# Patient Record
Sex: Male | Born: 1945 | Race: White | Hispanic: No | Smoking: Former smoker
Health system: Southern US, Community
[De-identification: ages and names within clinical notes are randomized; demographics above are authoritative.]

## PROBLEM LIST (undated history)

## (undated) DIAGNOSIS — Z950 Presence of cardiac pacemaker: Secondary | ICD-10-CM

## (undated) DIAGNOSIS — C3400 Malignant neoplasm of unspecified main bronchus: Secondary | ICD-10-CM

## (undated) DIAGNOSIS — M1990 Unspecified osteoarthritis, unspecified site: Secondary | ICD-10-CM

## (undated) DIAGNOSIS — R599 Enlarged lymph nodes, unspecified: Secondary | ICD-10-CM

## (undated) DIAGNOSIS — C4442 Squamous cell carcinoma of skin of scalp and neck: Secondary | ICD-10-CM

## (undated) DIAGNOSIS — Z9289 Personal history of other medical treatment: Secondary | ICD-10-CM

## (undated) DIAGNOSIS — R591 Generalized enlarged lymph nodes: Secondary | ICD-10-CM

## (undated) DIAGNOSIS — Z9989 Dependence on other enabling machines and devices: Secondary | ICD-10-CM

## (undated) DIAGNOSIS — H353190 Nonexudative age-related macular degeneration, unspecified eye, stage unspecified: Secondary | ICD-10-CM

## (undated) DIAGNOSIS — F419 Anxiety disorder, unspecified: Secondary | ICD-10-CM

## (undated) DIAGNOSIS — I2510 Atherosclerotic heart disease of native coronary artery without angina pectoris: Secondary | ICD-10-CM

## (undated) DIAGNOSIS — I480 Paroxysmal atrial fibrillation: Secondary | ICD-10-CM

## (undated) DIAGNOSIS — Z85820 Personal history of malignant melanoma of skin: Secondary | ICD-10-CM

## (undated) DIAGNOSIS — C3490 Malignant neoplasm of unspecified part of unspecified bronchus or lung: Secondary | ICD-10-CM

## (undated) HISTORY — DX: Malignant neoplasm of unspecified main bronchus: C34.00

## (undated) HISTORY — DX: Personal history of malignant melanoma of skin: Z85.820

## (undated) HISTORY — DX: Squamous cell carcinoma of skin of scalp and neck: C44.42

## (undated) HISTORY — DX: Personal history of other medical treatment: Z92.89

## (undated) HISTORY — DX: Generalized enlarged lymph nodes: R59.1

## (undated) HISTORY — DX: Anxiety disorder, unspecified: F41.9

## (undated) HISTORY — DX: Atherosclerotic heart disease of native coronary artery without angina pectoris: I25.10

## (undated) HISTORY — DX: Enlarged lymph nodes, unspecified: R59.9

## (undated) HISTORY — DX: Nonexudative age-related macular degeneration, unspecified eye, stage unspecified: H35.3190

## (undated) HISTORY — DX: Unspecified osteoarthritis, unspecified site: M19.90

---

## 1999-11-18 ENCOUNTER — Ambulatory Visit (HOSPITAL_COMMUNITY): Admission: RE | Admit: 1999-11-18 | Discharge: 1999-11-19 | Payer: Self-pay | Admitting: Cardiovascular Disease

## 1999-12-29 ENCOUNTER — Encounter: Payer: Self-pay | Admitting: Surgery

## 2000-01-03 ENCOUNTER — Inpatient Hospital Stay (HOSPITAL_COMMUNITY): Admission: RE | Admit: 2000-01-03 | Discharge: 2000-01-09 | Payer: Self-pay | Admitting: Surgery

## 2000-01-03 ENCOUNTER — Encounter (INDEPENDENT_AMBULATORY_CARE_PROVIDER_SITE_OTHER): Payer: Self-pay | Admitting: Specialist

## 2000-02-18 HISTORY — PX: OTHER SURGICAL HISTORY: SHX169

## 2001-07-25 ENCOUNTER — Encounter: Admission: RE | Admit: 2001-07-25 | Discharge: 2001-07-25 | Payer: Self-pay | Admitting: Emergency Medicine

## 2001-07-25 ENCOUNTER — Encounter: Payer: Self-pay | Admitting: Emergency Medicine

## 2001-07-29 ENCOUNTER — Encounter: Payer: Self-pay | Admitting: Emergency Medicine

## 2001-07-29 ENCOUNTER — Encounter: Admission: RE | Admit: 2001-07-29 | Discharge: 2001-07-29 | Payer: Self-pay | Admitting: Emergency Medicine

## 2001-08-07 ENCOUNTER — Encounter: Payer: Self-pay | Admitting: Internal Medicine

## 2001-08-07 ENCOUNTER — Observation Stay (HOSPITAL_COMMUNITY): Admission: AD | Admit: 2001-08-07 | Discharge: 2001-08-08 | Payer: Self-pay | Admitting: Internal Medicine

## 2001-08-09 ENCOUNTER — Ambulatory Visit (HOSPITAL_COMMUNITY): Admission: RE | Admit: 2001-08-09 | Discharge: 2001-08-09 | Payer: Self-pay | Admitting: Internal Medicine

## 2001-08-09 ENCOUNTER — Encounter: Payer: Self-pay | Admitting: Internal Medicine

## 2001-08-15 ENCOUNTER — Inpatient Hospital Stay (HOSPITAL_COMMUNITY): Admission: RE | Admit: 2001-08-15 | Discharge: 2001-08-20 | Payer: Self-pay | Admitting: Thoracic Surgery

## 2001-08-15 ENCOUNTER — Encounter (INDEPENDENT_AMBULATORY_CARE_PROVIDER_SITE_OTHER): Payer: Self-pay | Admitting: *Deleted

## 2001-08-15 ENCOUNTER — Encounter: Payer: Self-pay | Admitting: Thoracic Surgery

## 2001-08-15 HISTORY — PX: OTHER SURGICAL HISTORY: SHX169

## 2001-08-16 ENCOUNTER — Encounter: Payer: Self-pay | Admitting: Thoracic Surgery

## 2001-08-17 ENCOUNTER — Encounter: Payer: Self-pay | Admitting: Thoracic Surgery

## 2001-08-18 ENCOUNTER — Encounter: Payer: Self-pay | Admitting: Thoracic Surgery

## 2001-08-19 ENCOUNTER — Encounter: Payer: Self-pay | Admitting: Thoracic Surgery

## 2001-08-20 ENCOUNTER — Encounter: Payer: Self-pay | Admitting: Thoracic Surgery

## 2001-08-28 ENCOUNTER — Encounter: Admission: RE | Admit: 2001-08-28 | Discharge: 2001-08-28 | Payer: Self-pay | Admitting: Thoracic Surgery

## 2001-08-28 ENCOUNTER — Encounter: Payer: Self-pay | Admitting: Thoracic Surgery

## 2001-09-25 ENCOUNTER — Encounter: Payer: Self-pay | Admitting: Thoracic Surgery

## 2001-09-25 ENCOUNTER — Encounter: Admission: RE | Admit: 2001-09-25 | Discharge: 2001-09-25 | Payer: Self-pay | Admitting: Thoracic Surgery

## 2001-11-07 ENCOUNTER — Encounter: Payer: Self-pay | Admitting: Thoracic Surgery

## 2001-11-07 ENCOUNTER — Encounter: Admission: RE | Admit: 2001-11-07 | Discharge: 2001-11-07 | Payer: Self-pay | Admitting: Thoracic Surgery

## 2002-01-28 ENCOUNTER — Encounter (INDEPENDENT_AMBULATORY_CARE_PROVIDER_SITE_OTHER): Payer: Self-pay | Admitting: Specialist

## 2002-01-28 ENCOUNTER — Ambulatory Visit (HOSPITAL_COMMUNITY): Admission: RE | Admit: 2002-01-28 | Discharge: 2002-01-28 | Payer: Self-pay | Admitting: Gastroenterology

## 2002-01-30 ENCOUNTER — Encounter: Admission: RE | Admit: 2002-01-30 | Discharge: 2002-01-30 | Payer: Self-pay | Admitting: Thoracic Surgery

## 2002-01-30 ENCOUNTER — Encounter: Payer: Self-pay | Admitting: Thoracic Surgery

## 2002-05-01 ENCOUNTER — Encounter: Payer: Self-pay | Admitting: Thoracic Surgery

## 2002-05-01 ENCOUNTER — Encounter: Admission: RE | Admit: 2002-05-01 | Discharge: 2002-05-01 | Payer: Self-pay | Admitting: Thoracic Surgery

## 2002-05-02 ENCOUNTER — Encounter: Payer: Self-pay | Admitting: Thoracic Surgery

## 2002-05-02 ENCOUNTER — Encounter: Admission: RE | Admit: 2002-05-02 | Discharge: 2002-05-02 | Payer: Self-pay | Admitting: Thoracic Surgery

## 2002-05-09 ENCOUNTER — Ambulatory Visit (HOSPITAL_COMMUNITY): Admission: RE | Admit: 2002-05-09 | Discharge: 2002-05-09 | Payer: Self-pay | Admitting: Thoracic Surgery

## 2002-05-09 ENCOUNTER — Encounter: Payer: Self-pay | Admitting: Thoracic Surgery

## 2002-05-12 ENCOUNTER — Encounter (INDEPENDENT_AMBULATORY_CARE_PROVIDER_SITE_OTHER): Payer: Self-pay | Admitting: *Deleted

## 2002-05-12 ENCOUNTER — Encounter (INDEPENDENT_AMBULATORY_CARE_PROVIDER_SITE_OTHER): Payer: Self-pay | Admitting: Specialist

## 2002-05-12 ENCOUNTER — Encounter: Payer: Self-pay | Admitting: Thoracic Surgery

## 2002-05-12 ENCOUNTER — Ambulatory Visit (HOSPITAL_COMMUNITY): Admission: RE | Admit: 2002-05-12 | Discharge: 2002-05-12 | Payer: Self-pay | Admitting: Thoracic Surgery

## 2002-05-27 ENCOUNTER — Encounter: Admission: RE | Admit: 2002-05-27 | Discharge: 2002-05-27 | Payer: Self-pay | Admitting: Thoracic Surgery

## 2002-05-27 ENCOUNTER — Encounter: Payer: Self-pay | Admitting: Thoracic Surgery

## 2002-06-10 ENCOUNTER — Encounter: Admission: RE | Admit: 2002-06-10 | Discharge: 2002-06-10 | Payer: Self-pay | Admitting: Thoracic Surgery

## 2002-06-10 ENCOUNTER — Encounter: Payer: Self-pay | Admitting: Thoracic Surgery

## 2002-07-15 ENCOUNTER — Encounter: Payer: Self-pay | Admitting: Thoracic Surgery

## 2002-07-15 ENCOUNTER — Encounter: Admission: RE | Admit: 2002-07-15 | Discharge: 2002-07-15 | Payer: Self-pay | Admitting: Thoracic Surgery

## 2002-10-14 ENCOUNTER — Encounter: Payer: Self-pay | Admitting: Thoracic Surgery

## 2002-10-14 ENCOUNTER — Encounter: Admission: RE | Admit: 2002-10-14 | Discharge: 2002-10-14 | Payer: Self-pay | Admitting: Thoracic Surgery

## 2003-01-20 ENCOUNTER — Encounter: Admission: RE | Admit: 2003-01-20 | Discharge: 2003-01-20 | Payer: Self-pay | Admitting: Thoracic Surgery

## 2003-07-28 ENCOUNTER — Encounter: Admission: RE | Admit: 2003-07-28 | Discharge: 2003-07-28 | Payer: Self-pay | Admitting: Thoracic Surgery

## 2003-08-04 ENCOUNTER — Ambulatory Visit (HOSPITAL_COMMUNITY): Admission: RE | Admit: 2003-08-04 | Discharge: 2003-08-04 | Payer: Self-pay | Admitting: Thoracic Surgery

## 2003-10-07 ENCOUNTER — Encounter: Admission: RE | Admit: 2003-10-07 | Discharge: 2003-10-07 | Payer: Self-pay | Admitting: Thoracic Surgery

## 2004-01-05 ENCOUNTER — Encounter: Admission: RE | Admit: 2004-01-05 | Discharge: 2004-01-05 | Payer: Self-pay | Admitting: Thoracic Surgery

## 2004-01-13 ENCOUNTER — Encounter: Admission: RE | Admit: 2004-01-13 | Discharge: 2004-01-13 | Payer: Self-pay | Admitting: Thoracic Surgery

## 2004-01-18 ENCOUNTER — Encounter (INDEPENDENT_AMBULATORY_CARE_PROVIDER_SITE_OTHER): Payer: Self-pay | Admitting: Specialist

## 2004-01-18 ENCOUNTER — Inpatient Hospital Stay (HOSPITAL_COMMUNITY): Admission: RE | Admit: 2004-01-18 | Discharge: 2004-01-22 | Payer: Self-pay | Admitting: Thoracic Surgery

## 2004-01-18 HISTORY — PX: OTHER SURGICAL HISTORY: SHX169

## 2004-01-28 ENCOUNTER — Encounter: Admission: RE | Admit: 2004-01-28 | Discharge: 2004-01-28 | Payer: Self-pay | Admitting: Thoracic Surgery

## 2004-02-04 ENCOUNTER — Ambulatory Visit: Payer: Self-pay | Admitting: Oncology

## 2004-02-18 ENCOUNTER — Encounter: Admission: RE | Admit: 2004-02-18 | Discharge: 2004-02-18 | Payer: Self-pay | Admitting: Thoracic Surgery

## 2004-03-31 ENCOUNTER — Encounter: Admission: RE | Admit: 2004-03-31 | Discharge: 2004-03-31 | Payer: Self-pay | Admitting: Thoracic Surgery

## 2004-06-02 ENCOUNTER — Ambulatory Visit: Payer: Self-pay | Admitting: Oncology

## 2004-07-20 ENCOUNTER — Encounter: Admission: RE | Admit: 2004-07-20 | Discharge: 2004-07-20 | Payer: Self-pay | Admitting: Thoracic Surgery

## 2004-12-01 ENCOUNTER — Ambulatory Visit: Payer: Self-pay | Admitting: Oncology

## 2005-02-01 ENCOUNTER — Encounter: Admission: RE | Admit: 2005-02-01 | Discharge: 2005-02-01 | Payer: Self-pay | Admitting: Thoracic Surgery

## 2005-05-31 ENCOUNTER — Ambulatory Visit: Payer: Self-pay | Admitting: Oncology

## 2005-08-02 ENCOUNTER — Encounter: Admission: RE | Admit: 2005-08-02 | Discharge: 2005-08-02 | Payer: Self-pay | Admitting: Thoracic Surgery

## 2005-11-29 ENCOUNTER — Ambulatory Visit: Payer: Self-pay | Admitting: Oncology

## 2006-02-21 ENCOUNTER — Encounter: Admission: RE | Admit: 2006-02-21 | Discharge: 2006-02-21 | Payer: Self-pay | Admitting: Thoracic Surgery

## 2006-03-20 HISTORY — PX: CARDIAC CATHETERIZATION: SHX172

## 2006-08-01 ENCOUNTER — Ambulatory Visit: Payer: Self-pay | Admitting: Oncology

## 2006-09-05 ENCOUNTER — Encounter: Admission: RE | Admit: 2006-09-05 | Discharge: 2006-09-05 | Payer: Self-pay | Admitting: Thoracic Surgery

## 2006-10-17 ENCOUNTER — Ambulatory Visit: Payer: Self-pay | Admitting: Thoracic Surgery

## 2006-11-27 ENCOUNTER — Ambulatory Visit: Payer: Self-pay | Admitting: Oncology

## 2006-12-18 ENCOUNTER — Inpatient Hospital Stay (HOSPITAL_BASED_OUTPATIENT_CLINIC_OR_DEPARTMENT_OTHER): Admission: RE | Admit: 2006-12-18 | Discharge: 2006-12-18 | Payer: Self-pay | Admitting: Cardiovascular Disease

## 2007-04-09 ENCOUNTER — Encounter: Admission: RE | Admit: 2007-04-09 | Discharge: 2007-04-09 | Payer: Self-pay | Admitting: Thoracic Surgery

## 2007-04-09 ENCOUNTER — Ambulatory Visit: Payer: Self-pay | Admitting: Thoracic Surgery

## 2007-04-12 ENCOUNTER — Ambulatory Visit (HOSPITAL_COMMUNITY): Admission: RE | Admit: 2007-04-12 | Discharge: 2007-04-12 | Payer: Self-pay | Admitting: Thoracic Surgery

## 2007-04-16 ENCOUNTER — Ambulatory Visit: Payer: Self-pay | Admitting: Thoracic Surgery

## 2007-04-24 ENCOUNTER — Encounter (INDEPENDENT_AMBULATORY_CARE_PROVIDER_SITE_OTHER): Payer: Self-pay | Admitting: Diagnostic Radiology

## 2007-04-24 ENCOUNTER — Ambulatory Visit (HOSPITAL_COMMUNITY): Admission: RE | Admit: 2007-04-24 | Discharge: 2007-04-24 | Payer: Self-pay | Admitting: Otolaryngology

## 2007-04-30 ENCOUNTER — Ambulatory Visit: Payer: Self-pay | Admitting: Thoracic Surgery

## 2007-05-02 ENCOUNTER — Ambulatory Visit: Admission: RE | Admit: 2007-05-02 | Discharge: 2007-05-02 | Payer: Self-pay | Admitting: Thoracic Surgery

## 2007-05-08 ENCOUNTER — Ambulatory Visit: Payer: Self-pay | Admitting: Thoracic Surgery

## 2007-05-08 ENCOUNTER — Encounter: Payer: Self-pay | Admitting: Internal Medicine

## 2007-05-20 ENCOUNTER — Ambulatory Visit: Admission: RE | Admit: 2007-05-20 | Discharge: 2007-07-09 | Payer: Self-pay | Admitting: Radiation Oncology

## 2007-06-28 ENCOUNTER — Ambulatory Visit: Payer: Self-pay | Admitting: Oncology

## 2007-08-08 ENCOUNTER — Ambulatory Visit: Admission: RE | Admit: 2007-08-08 | Discharge: 2007-08-08 | Payer: Self-pay | Admitting: Radiation Oncology

## 2007-08-19 ENCOUNTER — Ambulatory Visit (HOSPITAL_COMMUNITY): Admission: RE | Admit: 2007-08-19 | Discharge: 2007-08-19 | Payer: Self-pay | Admitting: Radiation Oncology

## 2007-09-12 ENCOUNTER — Ambulatory Visit: Payer: Self-pay | Admitting: Thoracic Surgery

## 2007-10-24 ENCOUNTER — Ambulatory Visit: Payer: Self-pay | Admitting: Oncology

## 2007-11-26 ENCOUNTER — Ambulatory Visit (HOSPITAL_COMMUNITY): Admission: RE | Admit: 2007-11-26 | Discharge: 2007-11-26 | Payer: Self-pay | Admitting: Thoracic Surgery

## 2007-11-27 ENCOUNTER — Ambulatory Visit: Payer: Self-pay | Admitting: Thoracic Surgery

## 2008-02-26 ENCOUNTER — Ambulatory Visit: Payer: Self-pay | Admitting: Thoracic Surgery

## 2008-02-26 ENCOUNTER — Encounter: Admission: RE | Admit: 2008-02-26 | Discharge: 2008-02-26 | Payer: Self-pay | Admitting: Thoracic Surgery

## 2008-04-29 ENCOUNTER — Ambulatory Visit: Payer: Self-pay | Admitting: Oncology

## 2008-06-08 ENCOUNTER — Ambulatory Visit (HOSPITAL_COMMUNITY): Admission: RE | Admit: 2008-06-08 | Discharge: 2008-06-08 | Payer: Self-pay | Admitting: Thoracic Surgery

## 2008-06-09 ENCOUNTER — Ambulatory Visit: Payer: Self-pay | Admitting: Thoracic Surgery

## 2008-09-09 ENCOUNTER — Ambulatory Visit: Payer: Self-pay | Admitting: Thoracic Surgery

## 2008-09-09 ENCOUNTER — Encounter: Admission: RE | Admit: 2008-09-09 | Discharge: 2008-09-09 | Payer: Self-pay | Admitting: Thoracic Surgery

## 2008-11-26 ENCOUNTER — Ambulatory Visit (HOSPITAL_COMMUNITY): Admission: RE | Admit: 2008-11-26 | Discharge: 2008-11-26 | Payer: Self-pay | Admitting: Thoracic Surgery

## 2008-12-01 ENCOUNTER — Ambulatory Visit: Payer: Self-pay | Admitting: Thoracic Surgery

## 2008-12-25 ENCOUNTER — Ambulatory Visit: Payer: Self-pay | Admitting: Oncology

## 2009-03-17 ENCOUNTER — Encounter: Admission: RE | Admit: 2009-03-17 | Discharge: 2009-03-17 | Payer: Self-pay | Admitting: Thoracic Surgery

## 2009-03-17 ENCOUNTER — Ambulatory Visit: Payer: Self-pay | Admitting: Thoracic Surgery

## 2009-06-25 ENCOUNTER — Ambulatory Visit: Payer: Self-pay | Admitting: Oncology

## 2009-06-28 ENCOUNTER — Ambulatory Visit (HOSPITAL_COMMUNITY): Admission: RE | Admit: 2009-06-28 | Discharge: 2009-06-28 | Payer: Self-pay | Admitting: Thoracic Surgery

## 2009-07-02 ENCOUNTER — Ambulatory Visit: Payer: Self-pay | Admitting: Thoracic Surgery

## 2009-07-26 ENCOUNTER — Ambulatory Visit: Payer: Self-pay | Admitting: Oncology

## 2009-12-28 ENCOUNTER — Ambulatory Visit: Payer: Self-pay | Admitting: Thoracic Surgery

## 2009-12-28 ENCOUNTER — Encounter: Admission: RE | Admit: 2009-12-28 | Discharge: 2009-12-28 | Payer: Self-pay | Admitting: Thoracic Surgery

## 2010-03-24 ENCOUNTER — Ambulatory Visit: Payer: Self-pay | Admitting: Oncology

## 2010-04-10 ENCOUNTER — Encounter: Payer: Self-pay | Admitting: Thoracic Surgery

## 2010-05-16 ENCOUNTER — Encounter (HOSPITAL_BASED_OUTPATIENT_CLINIC_OR_DEPARTMENT_OTHER): Payer: Medicare Other | Admitting: Oncology

## 2010-05-16 DIAGNOSIS — C4360 Malignant melanoma of unspecified upper limb, including shoulder: Secondary | ICD-10-CM

## 2010-05-16 DIAGNOSIS — C3410 Malignant neoplasm of upper lobe, unspecified bronchus or lung: Secondary | ICD-10-CM

## 2010-06-02 ENCOUNTER — Other Ambulatory Visit: Payer: Self-pay | Admitting: Thoracic Surgery

## 2010-06-02 DIAGNOSIS — C3410 Malignant neoplasm of upper lobe, unspecified bronchus or lung: Secondary | ICD-10-CM

## 2010-06-22 ENCOUNTER — Ambulatory Visit (INDEPENDENT_AMBULATORY_CARE_PROVIDER_SITE_OTHER): Payer: Medicare Other | Admitting: Thoracic Surgery

## 2010-06-22 ENCOUNTER — Ambulatory Visit
Admission: RE | Admit: 2010-06-22 | Discharge: 2010-06-22 | Disposition: A | Discharge status: Home / Self Care | Payer: Medicare Other | Source: Ambulatory Visit | Attending: Thoracic Surgery | Admitting: Thoracic Surgery

## 2010-06-22 DIAGNOSIS — C3410 Malignant neoplasm of upper lobe, unspecified bronchus or lung: Secondary | ICD-10-CM

## 2010-06-22 DIAGNOSIS — C3490 Malignant neoplasm of unspecified part of unspecified bronchus or lung: Secondary | ICD-10-CM

## 2010-06-23 NOTE — Letter (Signed)
June 22, 2010  G. Rolm Baptise, M.D. [address] [facility] [place], [place] [address]  Re:  DELFORD, WINGERT               DOB:  [birth year]  Dear Nida Boatman,  I saw the patient in the office today.  His blood pressure is 153/80, pulse 51, respirations 20, sats were 96%.  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  I will get another CT scan on him in 6 months.  His only other problem is that Dr. Pete Glatter said he has had some low platelets and is following the platelets.I suggested that if the platelets are low that he should probably see you about the platelet problem. Otherwise, he is doing well, and as mentioned, I will see him in 6 months with another CT scan.  Ines Bloomer, M.D. Electronically Signed  DPB/MEDQ  D:  06/22/2010  T:  06/23/2010  Job:  [id number]

## 2010-06-24 LAB — GLUCOSE, CAPILLARY: Glucose-Capillary: 105 mg/dL — ABNORMAL HIGH (ref 70–99)

## 2010-06-28 ENCOUNTER — Other Ambulatory Visit: Payer: Self-pay | Admitting: Dermatology

## 2010-06-30 LAB — GLUCOSE, CAPILLARY: Glucose-Capillary: 122 mg/dL — ABNORMAL HIGH (ref 70–99)

## 2010-08-02 NOTE — Letter (Signed)
February 26, 2008   Molli Hazard A. Kathrynn Running, MD  [address] [place]  [place], [place] [address]   Re:  Benjamin [name], Benjamin [name]               DOB:  May 16, 1945   Dear Susy Frizzle,   The patient comes back today and his chest x-ray shows that there is an  opacity where he had his radiation to his left upper lobe.  Lungs clear  to auscultation and percussion.  His weight was stable.  He has actually  gained 2 or 3 pounds since he stopped his radiation.  Overall, his blood  pressure was 150/90, pulse 52, respirations 18, and sats were 96%.  I  will plan to see him back again in 3 months and we will repeat his PET  scan at that time, hopefully that will be much different than the first  PET done in September.   Ines Bloomer, M.D.  Electronically Signed   DPB/MEDQ  D:  02/26/2008  T:  02/26/2008  Job:  [id number]   cc:   Leighton Roach. Truett Perna, M.D.

## 2010-08-02 NOTE — Letter (Signed)
December 28, 2009   Molli Hazard A. Kathrynn Running, MD  [address] [facility]  [place], [place] [address]   Re:  ODYN, TURKO               DOB:  1945/05/05   Dear Susy Frizzle:   I saw the patient back today.  He is doing well except he has had a  recent URI with cough.  We gave him some Tussionex as well as some Z-Pak  for this.  His CT scan did show this area in his left chest that is  really unchanged with 63 x 37 mm which was positive on PET and it was  thought to be probably secondary to radiation.  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  We can get another CT scan in 6 months, and if they are  increasing in size, then unfortunately we will have to consider doing  another needle biopsy, but I am not sure there is much other treatment  that can be given since he got maximal radiation.  I appreciate the  opportunity and he is not an operative candidate.   Sincerely,   Ines Bloomer, M.D.  Electronically Signed   DPB/MEDQ  D:  12/28/2009  T:  12/29/2009  Job:  [id number]

## 2010-08-02 NOTE — Letter (Signed)
April 09, 2007   Leighton Roach. Truett Perna, M.D.  [address] [facility]  [place] [address]   Re:  AMARIEN, CARNE               DOB:  12-21-1945   Dear Nida Boatman:   I saw Mr. Gustafson back for his 6 month CT and unfortunately this area of  scar tissue on the left side that I noted was more prominent.  It is  even a little more prominent now.  It is a cystic type area.  There is  always a chance that this would be some type of recurrent cancer on the  left side.  I am going to go ahead and get a PET scan on him and will  see him back here after his PET scan.  His blood pressure is 147/85,  pulse 53, respirations 18, sats were 97%.  I appreciate the opportunity  of seeing.  I will let you know of the findings.   Sincerely,   Ines Bloomer, M.D.  Electronically Signed   DPB/MEDQ  D:  04/09/2007  T:  04/09/2007  Job:  [id number]

## 2010-08-02 NOTE — Cardiovascular Report (Signed)
NAME:  Benjamin [name], Benjamin [name] NO.:  [id number]   MEDICAL RECORD NO.:  [id number]          PATIENT TYPE:  OIB   LOCATION:  1962                         FACILITY:  [facility]   PHYSICIAN:  Vesta Mixer, M.D. DATE OF BIRTH:  1945/09/07   DATE OF PROCEDURE:  12/18/2006  DATE OF DISCHARGE:                            CARDIAC CATHETERIZATION   Benjamin [name] is an elderly gentleman with a history of coronary artery  disease.  He has been having some episodes of chest pain that sounds  like unstable angina.  He is referred for a heart catheterization for  further evaluation.   PROCEDURE:  Left heart catheterization with coronary angiography.   The right femoral artery was easily cannulated using a modified  Seldinger technique.   HEMODYNAMICS:  LV pressure was 137/18 with an aortic pressure of 146/70.   ANGIOGRAPHY:  Left main:  The left main is fairly large.  There is a  slight ostial narrowing of about 10-20%.  There is no obstruction of  flow.   The left anterior descending artery is quite large.  There is a mid 40%  stenosis at the takeoff of a large septal branch.  The mid vessel and  distal vessel have minor luminal irregularities.   The first diagonal vessel is a fairly small to moderate vessel which is  fairly normal.  The second diagonal artery is a small to moderate-sized  vessel with a 50% proximal stenosis.   The left circumflex artery is a very large vessel.  It gives off a high  first obtuse marginal artery which is unremarkable.  The left circumflex  artery continues around the AV groove and then supplies multiple  posterolateral branches.  The origin of these posterolateral branches is  severely diseased between 75% and 90%.  It is difficult to see the exact  degree of stenosis because of the eccentric nature of the plaque.  This  is the site of his previous subtotal occlusion at the time of his last  heart catheterization.   The right coronary artery is  moderate to large in size.  It is dominant.  The posterior descending artery is normal.  The posterolateral segment  artery is normal.   The left ventriculogram was performed in a 30 RAO position.  It reveals  normal left ventricular systolic function.  The ejection fraction is  around 60%.   COMPLICATIONS:  None.   CONCLUSIONS:  Primarily single-vessel coronary artery disease involving  the distal left circumflex artery.  This vessel was subtotally occluded  at the time of his heart catheterization in 2001.  It appears to have  recanalized with  medical therapy.  We will continue with medical therapy.  We will  consider PTCA and stenting if he continues to have episodes of angina,  although the stenosis is quite complex and we would end up jailing  several large posterolateral branches.  We will continue to treat him  medically for now.           ______________________________  Vesta Mixer, M.D.     PJN/MEDQ  D:  12/18/2006  T:  12/18/2006  Job:  [id number]   cc:   Reuben Likes, M.D.

## 2010-08-02 NOTE — Letter (Signed)
September 09, 2008   Leighton Roach. Truett Perna, MD  [address] [facility]  [place] [address]   Re:  Benjamin [name], Benjamin [name]               DOB:  September 14, 1945   Dear Dr. Truett Perna:   The patient came today and I repeated a CT scan.  It shows no evidence  of recurrence of cancer this area.  However, Dr. Kathrynn Running gave him the  radiation.  It shows further areas of fibrosis.  As per our plan, I will  plan to repeat his PET scan in 3 months and hopefully that will be much  less activity in that area.  I am really satisfied with his progress.  His blood pressure was 140/80, pulse 50, respirations 18, and  saturations were 98%.  I appreciate the opportunity of seeing the  patient.   Sincerely,   Ines Bloomer, M.D.  Electronically Signed   DPB/MEDQ  D:  09/09/2008  T:  09/10/2008  Job:  [id number]   cc:   Artist Pais Kathrynn Running, M.D.  Hal T. Stoneking, M.D.

## 2010-08-02 NOTE — Letter (Signed)
July 02, 2009   Leighton Roach. Truett Perna, MD  [address] [facility]  [place], [place] [address]   Re:  Benjamin [name], Benjamin [name]               DOB:  13-Mar-1946   Dear Nida Boatman,   I saw the patient back in the office today.  His blood pressure is  137/80, pulse 64, respirations 18, sats were 95%.  He is doing well  overall.  His PET scan showed some increased activity in the left lung,  but I think this is all secondary to his radiation changes and no other  small areas of positivity in his PET scan.  I think everything is stable  right now.  We will plan to see him back again in 6 months and repeat  his CT scan.   Ines Bloomer, M.D.  Electronically Signed   DPB/MEDQ  D:  07/02/2009  T:  07/03/2009  Job:  [id number]

## 2010-08-02 NOTE — Letter (Signed)
May 08, 2007   Leighton Roach. Truett Perna, M.D.  [address] [facility]  [place] [address]   Re:  EDRIC, FETTERMAN               DOB:  January 19, 1946   Dear Nida Boatman:   I saw the patient back today.  His pulmonary function tests showed an  FEV of 5.25 or 147% of predicted, and an FEV1 with dilators of 2.42 or  70% of predicted, and a DLCO of 96%.  He did require dilators to  increase his FEV1.  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  I think he could tolerate a pneumonectomy, but I am somewhat  concerned about the situation given that we do not have a firm diagnosis  that this is cancer.  This has been very slow growing.  I am planning to  discuss this at cancer conference tomorrow with Dr. Kathrynn Running before  making a final decision to proceed.  We may decide just to watch him for  a while, and maybe repeat his biopsy.  This is really a dilemma.  I do  not want to put him through a high-risk procedure unless we are sure  that he does have cancer.  I will send him to Dr. Fannie Knee in the  interim to maximize his lung functions should he need to have either  radiation or surgery.   Ines Bloomer, M.D.  Electronically Signed   DPB/MEDQ  D:  05/08/2007  T:  05/09/2007  Job:  [id number]   cc:   Joni Fears D. Maple Hudson, MD, FCCP, FACP

## 2010-08-02 NOTE — Assessment & Plan Note (Signed)
OFFICE VISIT   Benjamin [name], Benjamin [name]  DOB:  1945-08-29                                        April 16, 2007  CHART #:  [id number]   The patient came for a followup today.  His blood pressure was 138/93,  pulse 74, respirations 18, sats were 98%.  We reviewed his PET scan and  it was positive in the superior segment of the left lower lobe with an  SUV of 4.6.  For this reason, I am worried that this may be a recurrent  cancer, and I have elected for him to get a needle biopsy for this.  He  also complained of a right lower lobe nodule, but I could not really  palpate anything that he was concerned about.  Will see him back again  after his needle biopsy.   Ines Bloomer, M.D.  Electronically Signed   DPB/MEDQ  D:  04/16/2007  T:  04/17/2007  Job:  [id number]

## 2010-08-02 NOTE — Letter (Signed)
March 17, 2009   Jillyn Hidden B. Truett Perna, MD  [address] [facility]  [place], [place] [address]   Re:  DEZMAN, GRANDA               DOB:  1946/03/11   Dear Nida Boatman,   I saw the patient back today, and the CT scan showed no recurrence of  his cancer.  The area of his left upper lobe where his radiation seems  to be a little more confluent and the area in his left iliac crest that  we were watching we did not recheck.  I think I will look at that again.  We will get a PET scan in April, so I have scheduled him to have a  followup CAT scan in April.  Hopefully, the activity in his left upper  lobe area was less than it was before as well as in the left iliac area.  He is doing well overall.  He is having some dizziness, and his blood  pressure is 140/87, pulse 48, respirations 18, sats were 97%.  He is  asked to see Dr. Elease Hashimoto again if he has anymore trouble with dizziness  because of possible bradycardia.   Ines Bloomer, M.D.  Electronically Signed   DPB/MEDQ  D:  03/17/2009  T:  03/18/2009  Job:  [id number]

## 2010-08-02 NOTE — Letter (Signed)
December 01, 2008   Jillyn Hidden B. Truett Perna, MD  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  I did not notice  any evidence of any recent new cancer.  There was some slight activity  in the left iliac, which I did not feel it was a metastatic disease but  is something that will need to watch.  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 Verdis Frederickson.  I will see him back again in  3 months with another CT scan, and then I repeat his PET scan in 6  months.  His blood pressure is 135/79, pulse 64, respirations 18, sats  were 95%.   Ines Bloomer, M.D.  Electronically Signed   DPB/MEDQ  D:  12/01/2008  T:  12/02/2008  Job:  [id number]

## 2010-08-02 NOTE — Letter (Signed)
November 27, 2007   Leighton Roach. Truett Perna, MD  [address] [place], [place]  [address]   Re:  Benjamin [name], Benjamin [name]               DOB:  June 10, 1945   Dear Nida Boatman,   I saw the patient back today.  He had a PET scan done and the PET scan  showed no evidence of any spread of his cancer.  The area where he had  his radiation was reactive, and he thought this was probably from the  radiation .  We will need to get a followup PET scan every 6 more months  just to be sure about this, but at least the initial report looks like  he has got a good response to the radiation.  I plan to see him back  again in 3 months with the chest x-ray, and I think he will be seeing  you in about that time.  His blood pressure was 147/84, pulse 52,  respirations 18, and sats were 94%.  I appreciate the opportunity of  seeing the patient.   Ines Bloomer, M.D.  Electronically Signed   DPB/MEDQ  D:  11/27/2007  T:  11/27/2007  Job:  [id number]   cc:   Artist Pais Kathrynn Running, M.D.

## 2010-08-02 NOTE — Letter (Signed)
October 17, 2006   Leighton Roach. Truett Perna, M.D.  [address] [facility]  [place] [address]   Re:  Benjamin, [name]               DOB:  [birth year]   Dear Nida Boatman:   I saw Benjamin [name] in the office today and repeated his CT scan and it was  negative.  It thought there was a little more prominence in the left  upper lobe scar tissue.   As far as his adenopathy, I palpated the right axillary nodes, about 1  cm and nontender.  I think you will see him in 3 months and I will see  him in 6 months and we will watch this closely.  I did not see any  enlarged nodes on CT scan.  Obviously, given his previous cancers, we  will need to watch this closely.  From our standpoint, though, [name] seems to be doing well.   Blood pressure of 141/87, pulse 66, respirations 18, saturation 94%.   Ines Bloomer, M.D.  Electronically Signed   DPB/MEDQ  D:  10/17/2006  T:  10/18/2006  Job:  [id number]

## 2010-08-02 NOTE — Letter (Signed)
September 12, 2007   Leighton Roach. Truett Perna, MD  [address] [facility]  [place], [place] [address]   Re:  Benjamin [name], Benjamin [name]               DOB:  [birth year]   Dear Nida Boatman,   I saw the patient back today.  He has completed his radiation.  He looks  good.  He has recovered from his radiation.  CT scan in June showed a  decrease in the size from 6 down to 5.  He will be seeing you in August,  and I plan to see him again in September.  I think around that time, we  need to get a PET scan.  I have ordered it for September, but can no  longer get it earlier.  His blood pressure is 146/86, pulse 72,  respirations 18, and sats are 93%.  He is complaining of right hip pain,  and he is going to see Dr. Lequita Halt and Dr. Cornelius Moras regarding his right hip.  I will see him back.  I appreciate the opportunity of seeing the  patient.   Ines Bloomer, M.D.  Electronically Signed   DPB/MEDQ  D:  09/12/2007  T:  09/12/2007  Job:  [id number]   cc:   Artist Pais Kathrynn Running, M.D.

## 2010-08-02 NOTE — H&P (Signed)
NAME:  Benjamin [name] NO.:  [id number]   MEDICAL RECORD NO.:  [id number]           PATIENT TYPE:   LOCATION:                                 FACILITY:   PHYSICIAN:  Vesta Mixer, M.D. DATE OF BIRTH:  1945-07-03   DATE OF ADMISSION:  DATE OF DISCHARGE:                              HISTORY & PHYSICAL   Benjamin [name] is a middle-aged gentleman with a history of moderate  coronary artery disease, lung cancer, and adrenal tumor.  He presented  to me with episodes of bradycardia and unstable angina and is now  admitted for heart catheterization.   Benjamin [name] was last seen in our office approximately five years ago.  He  had had a stress Cardiolite study as part of a preoperative workup prior  to his adrenal surgery.  The Cardiolite study was found to be abnormal.  On heart catheterization he was found to have a long 50% LAD stenosis.  His left circumflex artery was subtotally occluded in the distal aspect  and the distal circumflex filled the collateral vessels.  The right  coronary artery was large and dominant and had minor luminal  irregularities.  He was noted to have some hypotension and bradycardia  following these injections.  This responded quite nicely to atropine.   The patient had normal left ventricular systolic function with EF 65-  70%.   Benjamin [name] has had a rough several years since I have last seen him.  He  has had lung cancer and has had surgery twice.  He has also had his  adrenal surgery.  He was recently seen by the oncologist.  He was  recently seen by his oncologist and was found to have bradycardia.  When  questioned he also admitted to having some episodes of chest discomfort  and chest pressure.  These episodes of chest pressure occurred at  various times and lasted for several minutes.  There was some  association with shortness of breath.  There was no syncope or  presyncope.  There is no PND or orthopnea.   CURRENT MEDICATIONS:  1. Klonopin three times a day.  2. Aspirin 81 mg a day.  3 Fish oil 1200 mg twice a day.   ALLERGIES:  None.   PAST MEDICAL HISTORY:  1. History of coronary artery disease.  2. History of lung cancer.  3. History of adrenal mass.   SOCIAL HISTORY:  The patient used to smoke heavily, but stopped in May  2005.  He does not drink alcohol.   FAMILY HISTORY:  Father died at age 77 due to rheumatic fever.  His  mother has a history of lung cancer.   REVIEW OF SYSTEMS:  His Review of Systems is reviewed and is essentially  negative except as noted in the HPI.   PHYSICAL EXAMINATION:  GENERAL:  He is a middle-aged gentleman in no  acute distress.  He is alert and oriented x3.  His mood and affect are  normal.  VITAL SIGNS:  Weight is 224, blood pressure 134/70 with heart rate 47.  NECK:  Reveals  2+ carotids.  No bruits, no JVD, no thyromegaly.  LUNGS:  Clear to auscultation.  HEART:  Regular rate, S1, S2.  ABDOMEN:  Reveals good bowel sounds and is nontender.  EXTREMITIES:  No clubbing, cyanosis or edema.  NEUROLOGICAL:  Nonfocal.   His EKG from Dr. Melanee Spry office reveals marked sinus bradycardia with  ventricular rate of 38 beats a minute.  He has no ST or T wave changes.   Benjamin [name] presents with fairly significant bradycardia as well as some  symptoms consistent with unstable angina.  At this point I think that he  needs a heart catheterization.  I considered whether or not to do a  stress Cardiolite study, but I think that it would be abnormal since he  has a known occlusion of his left circumflex artery.  It will be fairly  difficult to discern whether or not a posterolateral defect is just due  to the circumflex occlusion or perhaps that he has advanced disease that  is causing these symptoms.  We have discussed the risks, benefits, and  options of heart catheterization.  He understands and agrees to proceed.           ______________________________  Vesta Mixer,  M.D.     PJN/MEDQ  D:  12/11/2006  T:  12/12/2006  Job:  [id number]   cc:   Reuben Likes, M.D.  Leighton Roach. Truett Perna, M.D.

## 2010-08-02 NOTE — Letter (Signed)
April 30, 2007   Leighton Roach. Truett Perna, M.D.  [address] [facility]  [place] [address]   Re:  ZAYDN, GUTRIDGE               DOB:  April 23, 1945   Dear Nida Boatman:   I saw the patient back today after his biopsy and it showed atypical  cells suspicious for nonsmall cell lung cancer.  Given the positive  pattern, this location, and his previous history, I am sure that this is  probably recurring cancer.  I am planning to get a full set of pulmonary  function tests on him to see if he would be a candidate for completion  pneumonectomy.  If not, his only other option would be radiation and  chemotherapy.  I will give you a call and discuss this with you.  I plan  to see him back again in 1 week to rediscuss the situation.   Sincerely,   Ines Bloomer, M.D.  Electronically Signed   DPB/MEDQ  D:  04/30/2007  T:  05/02/2007  Job:  [id number]

## 2010-08-02 NOTE — Letter (Signed)
June 10, 2008   Jillyn Hidden B. Truett Perna, MD  [address] [facility]  [place] [address]   Re:  ARLO, BUTT               DOB:  1946-03-04   Dear Nida Boatman:   I saw the patient back today after his PET scan and the PET scan was  definitely improved.  There is further retraction and improvement in the  hypermetabolic activity at the left lower process compatible with  treated tumor.  No other areas were seen, so I think we are looking  good.  I will plan to see him back in 3 months and repeat his CT scan  and repeat his PET scan in 6 months.  He has no other major problems  since then.  His blood pressure is 143/83, pulse 61, respirations 18,  and saturations were 96%.   Ines Bloomer, M.D.  Electronically Signed   DPB/MEDQ  D:  06/10/2008  T:  06/11/2008  Job:  [id number]

## 2010-08-05 NOTE — Op Note (Signed)
NAME:  Benjamin [name], Benjamin [name] NO.:  [id number]   MEDICAL RECORD NO.:  [id number]          PATIENT TYPE:  INP   LOCATION:  2899                         FACILITY:  [facility]   PHYSICIAN:  Ines Bloomer, M.D. DATE OF BIRTH:  [birth year]   DATE OF PROCEDURE:  01/18/2004  DATE OF DISCHARGE:                                 OPERATIVE REPORT   PREOPERATIVE DIAGNOSIS:  Nonsmall cell lung cancer, right upper lobe lesion,  status post left upper lobectomy for nonsmall cell lung cancer.   POSTOPERATIVE DIAGNOSIS:  Second primary nonsmall cell lung cancer right  upper lobe.   PROCEDURE:  Right VATS, mini thoracotomy, wedge resection of right upper  lobe lesion with node sampling.   SURGEON:  Ines Bloomer, M.D.   ASSISTANT:  Rowe Clack, P.A.-C.   DESCRIPTION OF PROCEDURE:  After percutaneous insertion of all monitoring  lines, the patient underwent general anesthesia and was prepped and draped  in the usual sterile fashion.  Dual lumen tube was inserted.  He was then  turned to the right lateral thoracotomy position and he was prepped and  draped in the usual sterile fashion.  Two trocar sites were made in the  anterior and posterior axillary line at the 7th intercostal space.  Two  trocars were inserted.  30 degree scope was inserted and no intrapleural  lesions could be seen.  The lesion was in the apex of the right upper lobe.  A  7 cm incision was made over the 5th intercostal space with the latissimus  being partially divided, serratus reflected anteriorly.  The 5th intercostal  space was entered and a small Tuffier was placed.  The lung was brought into  the wound and the lesion, which was about 2 cm in size, was grasped with the  Duvall lung clamp and then resected with several applications of the EZ45  stapler.  It was a nonsmall cell lung cancer with negative margins.  Then  dissection was started underneath the azygos vein and down and checking out  several 4R  nodes and 10R nodes and then dissecting down the right mainstem  bronchus, dissecting out an 11R node.  Two chest tubes were brought in  through the trocar sites and tied in place with 0 silk. The On-Q catheters  were placed in the usual fashion in the paravertebral space and sutured in  place with 4-0 chromic.  The chest was closed with two  pericostals of #1 Vicryl in the muscle layer, 2-0 Vicryl in the subcutaneous  tissue, and Dermabond for the skin.  Then attention was turned to the right  occipital temporal area where there was a 1 cm inclusion cyst on the scalp  and this was excised with an elliptical incision and sent for permanent  frozen, then closed with 4-0 Monocryl.       DPB/MEDQ  D:  01/18/2004  T:  01/18/2004  Job:  [id number]   cc:   Leighton Roach. Truett Perna, M.D.  [address]- [facility]  [place]  [place] [address]  Fax: [phone]

## 2010-08-05 NOTE — Discharge Summary (Signed)
[place]. [facility]  Patient:    Benjamin [name], Benjamin [name] Visit Number: [id number] MRN: [id number]          Service Type: SUR Location: 2000 2039 01 Attending Physician:  Cameron Proud Dictated by:   Maxwell Marion, RNFA Admit Date:  08/15/2001 Discharge Date: 08/20/2001   CC:         Reuben Likes, M.D.  Clinton D. Maple Hudson, M.D.  Leighton Roach. Truett Perna, M.D.  Phyllis Ginger. Ilsa Iha, M.D.   Discharge Summary  DATE OF BIRTH:  06-28-1945  ADMITTING DIAGNOSIS:  Left upper lobe lung carcinoma.  PAST MEDICAL HISTORY: 1. 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 2. History of Cushings syndrome. 3. Coronary artery disease, status post PTCA. 4. Left eye cataract. 5. Arthritis of both knees and hands. 6. Anxiety.  PAST SURGICAL HISTORY: 1. Elbow surgery. 2. Right knee surgery.  ALLERGIES:  No known drug allergies.  DISCHARGE DIAGNOSIS:  Left upper lobe lung carcinoma, status post left upper lobectomy.  HISTORY OF PRESENT ILLNESS:  The patient is a 65 year old, Caucasian male who presented to his primary care [facility], Dr. Lorenz Coaster, with complaints of flu-like symptoms.  A chest x-ray was taken which revealed left upper lobe mass.  A CT scan of the chest was obtained which revealed a 4 x 4.2 x 5.6 cm mass.  Also, there was a 2 cm bleb in the right lower lobe.  Following this, a needle biopsy was performed on the left lung.  Tissue was returned as positive for malignancy.  He was evaluated in the [facility] office by Dr. Karle Plumber on May 28.  After examination of patient and review of the records including a CT scan, Dr. Edwyna Shell recommended admission and surgical intervention for his left upper lobe lung lesion.  HOSPITAL COURSE:  On May 29, the patient was admitted to [facility] in the care of Dr. Karle Plumber.  He underwent the following surgical procedures:  Left VATS and thoracotomy, left upper lobectomy,  multiple lymph node biopsies.  Intraoperative frozen section pathology was returned as adenocarcinoma.  Bronchial margins were negative for tumor.  Mr. Zurawski tolerated this procedure well and was transferred to PACU in stable condition.  The patient has remained hemodynamically stable from surgery and his postoperative course has been uneventful.  Final pathology has returned as poorly differentiated adenocarcinoma staged as T2 N0 M0.  All lymph nodes were negative for malignancy.  On June 2, bone scan was performed with results of his test are pending as are the results of the CT scan of the brain.  On June 2, oncology consult was obtained with Dr. Mancel Bale.  After examination, the patient revealed all the records.  Dr. Truett Perna recommends enrollment in an observational study for followup.  He will arrange followup with the patient as an outpatient.  On June 2, the patients vital signs are stable.  He is afebrile.  He feels very well.  His incision is healing well.  His lungs are clear to auscultation bilaterally.  He is tolerating a regular diet.  His pain is well-controlled and he is ambulating independently.  It is anticipated he will be ready for discharge on tomorrow, August 20, 2001.  LABORATORY DATA AND X-RAY FINDINGS:  On May 31, WBC 11.2, hemoglobin 13.1, hematocrit 37.9, platelets 178.  Sodium 134, potassium 3.6, BUN 8, creatinine 1.0.  CONDITION ON DISCHARGE:  Improved.  ACTIVITY:  He has been asked to refrain from any  driving and any heavy lifting.  Continue daily exercises and daily walks.  SPECIAL INSTRUCTIONS:  Refrain from smoking.  DIET:  No restrictions.  WOUND CARE:  He may shower with mild soap and water.  He has been instructed to exam his incisions daily.  If it becomes red, hot, swollen, draining or if he has a fever greater than 101 degrees Fahrenheit, he is to call Dr. Remo Lipps office.  DISCHARGE MEDICATIONS: 1. Tylox one to two p.o. q.4-6h.  p.r.n. pain. 2. Klonopin 1 mg t.i.d. p.r.n. for anxiety.  FOLLOWUP:  He has an appointment to see Dr. Edwyna Shell at the [facility] office on Wednesday, June 11, at 1:30 p.m.  He has also been asked to get a chest x-ray at [facility] that day at 12:30 p.m.   Dictated by:   Maxwell Marion, RNFA  Attending Physician:  Cameron Proud DD:  08/19/01 TD:  08/21/01 Job: [id number]

## 2010-08-05 NOTE — H&P (Signed)
[place]. [facility]  Patient:    Benjamin [name], Benjamin [name] Visit Number: [id number] MRN: [id number]          Service Type: SUR Location: 3300 3312 01 Attending Physician:  Cameron Proud Dictated by:   Sherrie George, P.A. Admit Date:  08/15/2001   CC:         Reuben Likes, M.D.  Phyllis Ginger. Ilsa Iha, M.D.  Clinton D. Maple Hudson, M.D.   History and Physical  CHIEF COMPLAINT:  Left upper lobe lesion.  REFERRING PHYSICIANS: 1. Reuben Likes, M.D. 2. Mark P. Ilsa Iha, M.D. 3. Clinton D. Maple Hudson, M.D.  HISTORY OF PRESENT ILLNESS:  The patient is a 65 year old white male who presented to Reuben Likes, M.D., approximately a week-and-a-half ago with complaints of flu-like symptoms.  He underwent a chest x-ray which showed a mass in the left upper lobe.  The mass was 4 x 4.2 x 5.6 cm with a thick-wall cavity.  Also found was a 2 cm bleb of the superior segment of the right lower lobe.  The patient underwent needle biopsy and although I do not have that pathology available, the patient reports that is was found to be positive.  He is admitted now for elective surgery with left video-assisted thoracoscopy and left lower lobectomy.  He has a chronic cough.  He has clear sputum.  He has had a couple episodes of hemoptysis.  He has a history of peripheral edema, but now recently.  He has a history of arrhythmias, including atrial fibrillation.  He has had chest pain in the past with some pain going up to his neck.  He has taken Tums for this, but it has had no real effect.  He denies PND, dyspnea on exertion, or shortness of breath.  He has never had any TIA symptoms.  No history of pneumonia or DVT.  PAST MEDICAL HISTORY: 1. Adrenal cortical adenoma.  He is status post right adrenalectomy in    December of 2001 by Velora Heckler, M.D.  He was also followed by Fritzi Mandes, M.D., for this. 2. He has a history of Cushings syndrome and he has been weaned  off    medications for this. 3. He has a history of mild coronary artery disease and he has undergone    intensive PTCA. 4. He has a history of atrial fibrillation. 5. He has been told that he has a cataract on the left which is not    bothersome. 6. He has arthritis in the knees and hands. 7. Additional surgeries include elbow and right knee repairs by [employer].    [name], M.D. 8. He has a history of anxiety and is treated by Phyllis Ginger. Ilsa Iha, M.D.  CURRENT MEDICATIONS:  Klonopin 1 mg p.o. t.i.d.  ALLERGIES:  None known.  FAMILY HISTORY:  His father died at age 62 with heart disease and has a history of rheumatic fever and aortic valve replacement.  He ultimately died from a blood clot to his brain.  His mother had a history of lung cancer and was a smoker.  He also has two brothers who are deceased with lung cancer and a history of smoking.  SOCIAL HISTORY:  He is divorced.  He has a girlfriend.  He has no children. He has worked at [employer] all of his life.  He is retired now.  He has been using tobacco since age 72.  He denies any alcohol use.  He smoked  one pack a day for at least 40 years.  REVIEW OF SYSTEMS:  As noted above, essentially negative.  CV:  Negative. Cardiac:  He has some chest discomfort and previous attempted PTCA.  He has no current symptoms of angina.  Pulmonary:  He has a chronic productive cough with clear sputum.  He has had a couple of episodes of hemoptysis.  He no longer has any fever or chills.  He denies any weight loss.  He denies shortness of breath, dyspnea on exertion, PND, or orthopnea.  GI:  Negative. GU:  Negative.  He denies any peripheral vascular symptoms.  PHYSICAL EXAMINATION:  The patient is a well-nourished, well-developed, white male, fairly anxious, and in no acute distress.  VITAL SIGNS:  The blood pressure is 136/80 in the right arm and 120/80 in the left arm.  The pulse is 76 and regular.  HEENT:  Head:  Normocephalic.  Eyes:   PERRLA.  EOMs intact.  Fundi not visualized.  Ears, Nose, Throat, and Mouth:  Grossly within normal limits.  NECK:  Supple.  No JVD.  No lymphadenopathy.  No thyromegaly.  CHEST:  Clear to auscultation and percussion bilaterally.  CARDIAC:  Regular rate and rhythm.  No murmurs, rubs, or gallops.  ABDOMEN:  Soft and nontender.  Positive bowel sounds.  No palpable organomegaly.  No bruits.  GENITOURINARY:  Not performed, not medically indicated.  RECTAL:  Not performed, not medically indicated.  EXTREMITIES:  No clubbing, cyanosis, or edema.  NEUROLOGIC:  No focal deficits.  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  PLAN:  The patient is admitted for elective video-assisted thoracoscopy, lobectomy, and further treatment as indicated. Dictated by:   Sherrie George, P.A. Attending Physician:  Cameron Proud DD:  08/14/01 TD:  08/15/01 Job: [id number]

## 2010-08-05 NOTE — Discharge Summary (Signed)
NAME:  Benjamin [name], Benjamin [name] NO.:  [id number]   MEDICAL RECORD NO.:  [id number]          PATIENT TYPE:  INP   LOCATION:  2011                         FACILITY:  [facility]   PHYSICIAN:  Ines Bloomer, M.D. DATE OF BIRTH:  [birth year]   DATE OF ADMISSION:  01/18/2004  DATE OF DISCHARGE:  01/22/2004                                 DISCHARGE SUMMARY   PRIMARY ADMITTING DIAGNOSIS:  Right lung lesion.   ADDITIONAL/DISCHARGE DIAGNOSES:  1.  Adenosquamous carcinoma of the right lung, poorly-differentiated (T1 N0      MX).  2.  Previous history of non-small cell lung cancer status post resection of      the left upper lobe lung nodule in May 2003.  3.  History of right adrenal adenopathy.  4.  Anxiety.  5.  Arthritis.  6.  History of Cushing syndrome secondary to medication for adrenal      adenopathy now resolved.  7.  History of melanoma of the right shoulder.  8.  History of atrial fibrillation in the past.  9.  Mild coronary artery disease.   PROCEDURE PERFORMED:  Right VATS, right mini thoracotomy with right upper  lobe wedge resection and lymph node dissection.   HISTORY:  The patient is a 65 year old male who is well-known to [facility].  He  is status post left upper lobe wedge resection in May 2003, for a non-small  cell lung cancer.  He has been followed by Dr. Edwyna Shell and Dr. Truett Perna.  Most recently he underwent a CT scan of the chest as a surveillance measure  in May 2005 and this showed interval development of a right upper lobe 6 x  10 millimeter noncalcified nodule which was worrisome for either primary or  metastatic disease.  He underwent a PET scan subsequently and this showed an  area of increased uptake in the right upper lobe with an SUV of 1.2.  Followup CT was then repeated and this has shown an increase in size of the  right upper lobe lung lesion.  Because of the progression and size of the  area and the likelihood of recurrent carcinoma, Dr. Edwyna Shell  recommended  proceeding at this time with a right VATS and resection of the right upper  lobe.  After explanation of the risks, benefits and alternatives, the  patient did agree to proceed with surgery.   HOSPITAL COURSE:  He was admitted to [facility] on January 18, 2004.  He was taken to the operating room where he underwent a right VATS  with a right mini thoracotomy and a right upper lobe wedge resection and  lymph node dissection performed by Dr. Edwyna Shell.  He tolerated the procedure  well and was transferred from the PACU to the 3300 Unit in stable condition.  Pathology on the wedge resection was positive for poorly-differentiated  adenosquamous carcinoma with all nodes negative.  He has been seen in  followup during this admission by Dr. Truett Perna and it is still unclear if  this is a new primary tumor versus metastasis from his previous tumor.  Dr.  Truett Perna feels that the patient may followup with him for further discussion  of this within the next 2-3 weeks following discharge.  Postoperatively he  has done very well overall.  His chest tubes have been removed in a stepwise  fashion after all drainage had resolved and no evidence of air leak was  present.  His followup chest x-rays have shown no evidence of residual  pneumothorax.  He has had some mild arrythmias during his admission with  some atrial ectopy, but no sustained atrial fibrillation.  An EKG was  performed as well as cardiac enzymes and these were all negative for an  acute event.  Presently he is maintaining normal sinus rhythm with PACs with  rates in the 60-70s.  He is otherwise afebrile and all vital signs have been  stable.  He is ambulating in the halls without difficulty.  He has been  weaned from supplemental oxygen and is maintaining O2 sats of greater than  90% on room air.  His most recent labs show hemoglobin of 13, hematocrit 38,  white count 10.7, platelets 144,000, potassium 4.2, BUN and  creatinine 11  and 1.0 respectively, magnesium 2.1.  His surgical incision sites are all  healing well.  On physical exam his lungs are clear.  He is to undergo a CT  scan of the brain for clinical staging and the results are pending.  He has  been transferred to the floor.  It is felt if he remains stable over the  next 24 hours, and no acute changes occur, he will hopefully be ready for  discharge home on January 22, 2004 pending a morning PA and lateral chest x-  ray and examination on morning rounds.   DISCHARGE MEDICATIONS:  1.  Klonopin 1 mg t.i.d.  2.  Protonix 40 mg daily.  3.  Tylox 1-2 q.4 hours for pain.   DISCHARGE INSTRUCTIONS:  He is asked to refrain from driving, heavy lifting  or strenuous activity.  He may continue ambulating daily and using his  incentive spirometer.  He may shower daily and clean his incisions with soap  and water.  He will continue his same preoperative diet.   DISCHARGE FOLLOWUP:  He is asked to see Dr. Edwyna Shell on Thursday January 28, 2004 at 2:00 p.m.  He will have a chest x-ray at [facility] one hour prior to this appointment and should bring his films for Dr.  Edwyna Shell to review.  He will also see Dr. Truett Perna in the next 2-3 weeks and  an appointment will be arranged by the [facility].  In the interim if he  experiences any problems or has questions, he is asked to contact our office  immediately.       GC/MEDQ  D:  01/21/2004  T:  01/22/2004  Job:  [id number]   cc:   Reuben Likes, M.D.  [address]  [place]  [place] [address]  Fax: [phone]   Leighton Roach. Truett Perna, M.D.  [address]- [facility]  [place]  [place] [address]  Fax: [phone]

## 2010-08-05 NOTE — Op Note (Signed)
[place]  Patient:    Benjamin [name], Benjamin [name] Visit Number: [id number] MRN: [id number]          Service Type: SUR Location: 3300 3312 01 Attending Physician:  Cameron Proud Dictated by:   D. Karle Plumber, M.D. Admit Date:  08/15/2001   CC:         Clinton D. Maple Hudson, M.D.   Operative Report  PREOPERATIVE DIAGNOSIS:  Non-small-cell lung cancer, left upper lobe.  POSTOPERATIVE DIAGNOSIS:  Non-small-cell lung cancer, left upper lobe.  OPERATION PERFORMED:  Left video-assisted thoracic surgery, left thoracotomy, left upper lobectomy.  SURGEON: D. Karle Plumber, M.D.  FIRST ASSISTANT:  Loura Pardon, P.A.  ANESTHESIA:  General anesthesia.  DESCRIPTION OF PROCEDURE:  After percutaneous insertion of all monitoring lines, the patient underwent general anesthesia.  He was turned to the left lateral thoracotomy position and was prepped and draped in the usual sterile manner.  Two trocar sites were made in the anterior and posterior axillary lines at the 7th intercostal space after a dual-lumen tube had been inserted. A 30 degree scope was inserted and exploration was carried out and the lesion could be seen in the base of the lingular segment of the left upper lobe.  The fissure was stuck together in the area where the cancer was.  No other intrapleural metastases could be seen, so a posterolateral thoracotomy was made at the 5th intercostal space, partially dividing the latissimus, reflecting the serratus anteriorly, entering the 5th intercostal space and taking a portion of the 6th rib posteriorly at the angle subperiosteally. Then two Tuffiers were inserted at right angles.  Attention was started in the posterior hilum, dissecting out some 5 as well as 10-L nodes.  After these had all been dissected out, dissection was carried around anteriorly, dissecting out the anterior hilum, dissecting out the superior pulmonary vein and looping it with  two vascular tapes.  Then the inferior pulmonary ligament was taken down with electrocautery, taking down a 9-L node.  More were dissected out. Then attention was turned to the apicoposterior branch of the left upper lobe; this was dissected out and looped with a vascular tape, then anterior branch of the upper lobe dissected out and looped with a vascular tape.  The lingular portion of the fissure was divided with three applications of the E-Z 45, then dissection was started superiorly, dividing the superior portion of the fissure with the E-Z 45.  The dissection was carried down to the artery and the fissure and a small anterior branch was ligated with 2-0 silk, doubly clipped and divided.  The lingular branches had nodes involved, wrapped all around them posteriorly, and these had to be dissected out and after these had been dissected out, I was able to divide the rest of the fissure with three application of the E-Z 45.  The inferior lingular branch was ligated with 2-0 silk, doubly clipped and divided; the other branch was stapled with an Systems analyst.  Then the anterior branch was stapled and divided with an Autosuture stapler and finally the apicoposterior branch of the left upper lobe was stapled and divided with the Autosuture stapler.  Finally, the superior pulmonary vein was divided with the [facility] stapler.  Several more 11-L nodes and 10-L nodes were dissected free from around the bronchus.  The lingular branch was divided with the E-Z 45 and then the other branches were stapled with the TL-30 and divided.  The left upper lobe was removed and frozen  section revealed normal bronchial margins and an adenocarcinoma of the lesion.  Two chest tubes were brought in through separate stab wounds and tied in place with 0 silk.  The chest was closed.  FocalSeal was applied to the staple lines using first the primer, then the sealant and using the ultraviolet wand for ______ .   Chest was closed with four pericostals, #1 Vicryl in the muscle layer, 2-0 Vicryl in the subcutaneous tissue and 3-0 Vicryl as a subcuticular stitch and Dermabond for the skin.  The patient then returned to the recovery room after an epidural in stable condition. Dictated by:   D. Karle Plumber, M.D. Attending Physician:  Cameron Proud DD:  08/15/01 TD:  08/16/01 Job: [id number]

## 2010-08-05 NOTE — Procedures (Signed)
. [facility]  Patient:    Benjamin [name], Benjamin [name]                        MRN: [id number] Proc. Date: 01/03/00 Adm. Date:  [id number] Attending:  Bonnetta Barry                           Procedure Report  PROCEDURE:  Epidural catheter placement for postoperative epidural fentanyl pain control.  Prior to the surgical procedure, Dr. Demetria Pore discussed with the patient all the risks and benefits of postop pain control including risk of nerve damage, allergic reaction, paralysis, headache, back ache, and more commonly, itching.  The patient elected to proceed with the technique in consultation with the primary surgeon, Dr. Darnell Level.  PROCEDURE IN DETAIL:  After the surgical dressing was applied, the patient was turned to the full left lateral decubitus position.  Betadine prep x 3 was performed on the lumbar region.  Sterile technique was used.  A 17-gauge Tuohy needle was inserted in the L3-4 interspace using air loss resistance. The epidural space was located without difficulty.  There was no blood or CSF detected.  A nonstyletted catheter was then placed 6 cm into the epidural space through the needle, and the epidural needle was then removed.  There was again negative aspiration of blood or CSF through the catheter.  A mixture of preservative-free Xylocaine 1%, 5 cc, 0.9 normal saline, 10 cc, and 100 mcg of fentanyl was then slowly injected into the epidural catheter with frequent negative aspirations for blood and CSF.  The catheter was secured with adhesive to the patients back using Hypafix dressing, and a 2x2 gauze was used at the insertion site.  The patient was then  returned to the supine position, awakened, extubated, and taken to the recovery room in good condition where he was placed on continuous infusion of 5 mcg per cc epidural fentanyl and 1/16% Marcaine at an initial rate of 14 cc per hour.  He will be evaluated for the next  two to three days for adequacy of pain control.  There were no complications during the procedure. DD:  01/03/00 TD:  01/03/00 Job: [id number]

## 2010-08-05 NOTE — Procedures (Signed)
[place]. [facility]  Patient:    Benjamin [name], ROCKERS Visit Number: [id number] MRN: [id number]          Service Type: SUR Location: 3300 3312 [id number] Attending Physician:  Cameron Proud Dictated by:   Edwin Cap. Zoila Shutter, M.D. Proc. Date: 08/15/01 Admit Date:  08/15/2001   CC:         Norton Blizzard, M.D.  Department of Anesthesia   Procedure Report  HISTORY:  Mr. Kerwin is a 65 year old white male with a diagnosis of pulmonary lesion of the left upper lobe, scheduled for thoracotomy and lobectomy and for postoperative epidural analgesia, which has been understood by the patient preoperatively.  DESCRIPTION OF PROCEDURE:  At the termination of surgery while still under general anesthesia, Mr. Boissonneault remains in a right lateral decubitus position. His back is prepped with Betadine and draped in the usual sterile fashion. Using midline approach and loss of resistance technique, a peridural tap is accomplished at the T10-11 interspace with a 17-gauge Tuohy needle.  This is followed by the aspiration of blood and CSF, which was negative, and followed in turn by the injection of 10 cc of 1% lidocaine with 100 mcg of fentanyl. This was followed in turn by the passage of a peridural catheter 7 cm cephalad.  The catheter was then checked for patency and affixed to the patients back.  The patient was returned to the supine position, awakened, and brought to the postanesthesia care unit, where his peridural catheter will be connected to an infusion pump with a mixture of fentanyl 5 mcg/cc to be begun at 12 cc/hr. and adjusted as needed.  There were no complications.  He did well and will be followed in the usual fashion. Dictated by:   Edwin Cap. Zoila Shutter, M.D. Attending Physician:  Cameron Proud DD:  08/15/01 TD:  08/16/01 Job: [id number]

## 2010-08-05 NOTE — Cardiovascular Report (Signed)
[place]. [facility]  Patient:    Benjamin [name], Benjamin [name]                        MRN: [id number] Proc. Date: 11/18/99 Adm. Date:  [id number] Attending:  Koren Bound CC:         Velora Heckler, M.D.  Reuben Likes, M.D.   Cardiac Catheterization  PROCEDURE:  Cardiac catheterization with percutaneous transluminal coronary angioplasty attempt.  HISTORY:  Mr. Caul is a 65 year old gentleman with a history of an adrenal tumor.  He was scheduled for adrenal surgery.  He had an abnormal stress Cardiolite study consistent with an inferobasilar defect.  He was scheduled for heart catheterization for further evaluation.  DESCRIPTION OF PROCEDURE:  The right femoral artery was easily cannulated using a modified Seldinger technique.  HEMODYNAMIC DATA:  Left ventricular pressure was 122/29 with an aortic pressure of 122/72.  ANGIOGRAPHY:  The left main coronary artery has mild irregularities but no critical stenosis.  The left anterior descending artery is fairly normal proximally.  There are mild irregularities.  The mid LAD has a long 50% stenosis which does not appear to be obstructive.  The distal LAD has only minor luminal irregularities.  There are several moderate-sized diagonal branches which have 30% stenoses proximally.  The left circumflex artery is a rather large vessel.  It gives off a large first obtuse marginal artery which has a 20% stenosis in the proximal segment. The circumflex artery then has a 20-30% stenosis.  It gives off a second obtuse marginal artery which has a 60% stenosis proximally.  The circumflex is then subtotally occluded, with faint filling of the posterolateral segment artery.  This posterolateral segment artery can be seen later filling the right-to-left collaterals.  The right coronary artery is a large and dominant vessel.  There are minor luminal irregularities throughout its course.  The PDA and  posterolateral segment artery are normal.  Collaterals from the right coronary artery can be seen filling the distal left circumflex artery and the posterolateral segment artery.  Following these injections, the patient became hypotensive and bradycardic. He responded very nicely to atropine 0.5 mg IV.  He received IV fluids -- 750 cc of one-half normal saline.  Because of his hemodynamic instability after injection of the contrast, it was determined that perhaps the posterolateral segment artery was more hemodynamically significant that was originally thought.  We decided to proceed with PTCA today.  LEFT VENTRICULOGRAM:  The left ventriculogram was performed at this point.  It revealed overall well-preserved left ventricular systolic function.  Its ejection fraction is 65-70%.  There is no mitral regurgitation.  The inferior base seemed to contract fairly well.  PERCUTANEOUS TRANSLUMINAL CORONARY ANGIOPLASTY ATTEMPT:  The patient was given heparin 4500 units and was given an Integrilin bolus followed by an Integrilin drip.  The patient was given IV nitroglycerin.  The vessel plumped up to about a 2.5 mm in diameter.  There appeared to be a lumen between the circumflex artery and the distal segment.  A Trooper angioplasty wire was placed down, but we were unable to cross the lesion.  At this point, a 2.5 Quantum Monorail was positioned down the left circumflex artery for support.  Despite multiple attempts, we were still unable to cross the lesion with the guidewire.  The patient remained hemodynamically stable.  Followup angiography revealed no significant change.  The vessel remained subtotally occluded, with sluggish TIMI grade 2 flow  down the vessel.  The patient did not have any EKG changes and was free of pain.  It was decided to stop the procedure at this time due to the excessive risk of perforation and going through what appears to be a chronic occlusion.  COMPLICATIONS:   None.  CONCLUSIONS 1. Moderate coronary artery disease involving the left anterior descending and    circumflex artery.  He has a subtotally occluded distal left circumflex    artery/posterolateral segment artery. 2. Unsuccessful percutaneous transluminal coronary angioplasty of the distal    left circumflex artery due to failure to cross with a guidewire. 3. Normal left ventricular systolic function. 4. The patient remained quite stable.  We will treat these narrowings    medically.  He should be stable for his upcoming adrenal surgery with    little-to-no risk.  He has had a small infarct previously in this    posterolateral distribution.  This vessel is quite small and I do not think    that it will cause problems in the upcoming surgery.  He has moderate    irregularities in the other vessels.  We will encourage him to stop    smoking.  His cholesterol levels remain within normal limits. DD:  11/18/99 TD:  11/18/99 Job: [id number]

## 2010-08-05 NOTE — H&P (Signed)
NAME:  Benjamin [name], COPHER NO.:  [id number]   MEDICAL RECORD NO.:  [id number]          [name] TYPE:  INP   LOCATION:  NA                           FACILITY:  [facility]   PHYSICIAN:  Ines Bloomer, M.D. DATE OF BIRTH:  [birth year]   DATE OF ADMISSION:  01/18/2004  DATE OF DISCHARGE:                                HISTORY & PHYSICAL   REASON FOR ADMISSION:  Right lung lesion.   HISTORY OF PRESENT ILLNESS:  Benjamin [name] is a pleasant 65 year old male who  is well known to CVTS secondary to a prior history of non-small-cell lung  carcinoma, status post resection of a left upper lobe nodule on Aug 15, 2001. Benjamin [name] has been followed by Dr. Edwyna Shell by serial chest x-rays and  a CT scan was completed recently. A CT scan of Benjamin chest was completed on  Jul 28, 2003, and showed interval development of a right upper lobe 6 x 10-  mm, non calcified nodule that was worrisome for primary or metastatic  neoplastic disease. A PET scan was then completed and showed an uptake of  1.2 SUVs.  A follow-up CT scan was repeated and showed an increase in Benjamin  size of Benjamin right upper lobe lesion.  With these findings, Dr. Edwyna Shell  recommended a right VATS with thoracotomy for resection of this lesion  secondary to its high likelihood of recurrent cancer. Benjamin [name] presents  today for routine history and physical exam.   Currently, Benjamin [name] denies any cough or cold-like symptoms. He denies any  fevers, chills, or night sweats. He denies any shortness of breath or  dyspnea on exertion.   PAST MEDICAL HISTORY:  1.  History of non-small-cell lung carcinoma, followed by Dr. Edwyna Shell and Dr.      Truett Perna as described above.  2.  History of right adrenal adenopathy.  3.  Anxiety.  4.  Arthritis in Benjamin knees and hands.  5.  History of Cushing syndrome, while taking medication for adrenal      adenopathy. This has subsequently resolved.  6.  History of melanoma, which was removed from his  right shoulder.  7.  History of atrial fibrillation in Benjamin past.  8.  Mild coronary artery disease.   PAST SURGICAL HISTORY:  1.  Status post left upper lobectomy by Dr. Edwyna Shell on Aug 15, 2001.  2.  Status post adrenalectomy in December 2001 by Dr. Gerrit Friends.   MEDICATIONS:  Klonopin 1.0 mg p.o. t.i.d.   ALLERGIES:  AMITRIPTYLINE.   REVIEW OF SYSTEMS:  Please see HPI for pertinent positives and negatives,  otherwise as follows: Benjamin [name] does wear glasses for reading. He denies  any difficulty swallowing, speech impairment, or hoarseness. Benjamin [name]  denies any recent chest pain, palpitations, lightheaded, or dizziness. Benjamin  [name] denies any pulmonary problems including pneumonia, bronchitis, or  upper respiratory tract infection. Benjamin [name] denies any recent changes in  bowel or bladder patterns. He denies any hematochezia or hematuria. He  denies any dysuria, urgency, or frequency.   FAMILY HISTORY:  Benjamin [name]'s mother had a history  of lung cancer. Benjamin  [name]'s father had a history of coronary artery disease and died at age  3.  Benjamin [name] also has two uncles who had a history of lung cancer.   SOCIAL HISTORY:  Benjamin [name] is divorced and has a long-term girlfriend. He  has no children. He is retired from [employer] for which he worked for his  entire adult life. Benjamin [name] denies any alcohol use. Benjamin [name] has a  history of 40-pack year history of smoking, he quit on Aug 15, 2001, prior  to his lung resection.   PHYSICAL EXAMINATION:  VITAL SIGNS: Benjamin [name]'s blood pressure is 128/76  in Benjamin left arm, pulse 58 and regular, respirations 16 and unlabored, height  5 feet and 11 inches, and weight is 220 pounds.  GENERAL: This is a pleasant 65 year old white male who is somewhat nervous  during this exam. He is in no acute distress and is alert and oriented times  three.  HEENT: NCAT. PERRLA. EOMI. Oral mucosa is pink and moist without evidence of  exudate or  lesions.  NECK: Supple without JVD, bruits, or lymphadenopathy.  CHEST: Symmetrical on inspiration. His lung sounds are clear to auscultation  throughout.  CARDIAC: Regular rate and rhythm without murmurs, rubs, or gallops.  ABDOMEN: Soft, nontender, and nondistended with good bowel sounds in all  four quadrants.  GU/RECTAL EXAM: Deferred.  EXTREMITIES: Warm and dry without clubbing, cyanosis, or edema. Peripheral  pulses are as follows: 2+ carotid pulses bilaterally, 2+ femoral pulses  bilaterally, 2+ dorsalis pedis pulses bilaterally, 2+ posterior tibial  pulses bilaterally.  NEUROLOGIC: Cranial nerves II-XII are intact. His gait is steady. His muscle  strength is equal and appropriate throughout.   ASSESSMENT/PLAN:  This is a pleasant 65 year old male with likely recurrent  non-small-cell lung cancer in Benjamin right upper lobe. We will proceed as  planned with a right video-assisted thoracoscopy with thoracotomy with right  upper lobe wedge resection versus right upper lobectomy on January 18, 2004.  Dr. Edwyna Shell has seen and evaluated this [name] prior to this admission and  has explained Benjamin risks and benefits of Benjamin procedure. Benjamin [name] is in  understanding and has agreed to proceed as planned.       CAF/MEDQ  D:  01/15/2004  T:  01/15/2004  Job:  [id number]   cc:   Reuben Likes, M.D.  [address]  [place]  [place] [address]  Fax: [phone]   Leighton Roach. Truett Perna, M.D.  [address] [facility]  [place]  [place] [address]  Fax: [phone]

## 2010-08-05 NOTE — Consult Note (Signed)
[place]. [facility]  Patient:    Benjamin [name], Benjamin [name] Visit Number: [id number] MRN: [id number]          Service Type: SUR Location: 3300 3312 01 Attending Physician:  Cameron Proud Dictated by:   Leighton Roach Truett Perna, M.D. Proc. Date: 08/19/01 Admit Date:  08/15/2001   CC:         Benjamin [name], M.D.  Benjamin [name], M.D.  Benjamin [name], M.D.  Benjamin [name], M.D.   Consultation Report  REFERRING PHYSICIAN:  D. Karle [name], M.D.  PATIENT IDENTIFICATION:  Benjamin [name] is a 65 year old with a new diagnosis of nonsmall cell lung cancer.  HISTORY OF PRESENT ILLNESS:  Benjamin [name] presented to Dr. Lorenz Coaster with chills and arthralgias in May 2003.  A chest x-ray was obtained and this revealed a left upper lobe mass measuring 4.2 x 5.6 cm.  A CT scan of the chest confirmed a large thick walled cavitary mass involving the posterior and inferior aspect of the left upper lobe worrisome for a primary carcinoma.  A thin wall 2 cm bleb was noted in a superior segment of the right lower lobe.  The liver and left adrenal gland appeared normal.  He has undergone a previous adrenalectomy.  There was no evidence for metastatic adenopathy.  There were no effusions.  He was taken to a left VATS procedure and converted to a left thoracotomy for a left upper lobectomy on May 29.  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  The tumor involved the visceral pleura.  Bronchial and vascular resection margins were negative. Four level 12 lymph nodes were negative for tumor.  In addition to this multiple level 11, a level 9, level 10, and level 5 lymph nodes were negative for metastatic carcinoma.  He continues to recovery from surgery.  He states that prior to developing flu like symptoms a few weeks ago he has felt well.  PAST MEDICAL HISTORY: 1. History of a right adrenal  adenoma status post adrenalectomy. 2. History of atrial fibrillation. 3. Status post cardiac catheterization August 2001 with normal left    ventricular function and moderate coronary artery disease involving the LAD    and circumflex artery status post unsuccessful attempt at a PTCA of the    distal left circumflex artery. 4. History of anxiety.  PAST SURGICAL HISTORY: 1. Status post right adrenalectomy October 2001. 2. Right knee surgery 1978. 3. Left elbow surgery 1985.  FAMILY HISTORY:  His mother was diagnosed with lung cancer in her 52s.  She is alive after undergoing surgery and chemotherapy.  His father died with heart disease.  He has one brother.  He has no children.  Two maternal aunts had lung cancer.  SOCIAL HISTORY:  He lives alone in [place].  He is retired after working at the [employer].  He does not use alcohol. He has not smoked cigarettes for many years and quit at the time of his lung cancer diagnosis. He has never had a transfusion.  He denies any known risk factor for sexually transmitted diseases.  ALLERGIES:  No known drug allergies.  MEDICATIONS:  (On hospital admission) 1. Klonopin 1 mg t.i.d.  REVIEW OF SYSTEMS:  CONSTITUTIONAL:  Negative.  RESPIRATORY:  Negative. CARDIAC:  Negative.  NEUROLOGIC:  Negative.  GASTROINTESTINAL:  Negative. GENITOURINARY:  Negative.  MUSCULOSKELETAL:  No pain at present.  PHYSICAL EXAMINATION  HEENT:  Sclerae anicteric.  Oropharynx without thrush or ulcerations.  No visible mass.  NECK:  Without palpable mass.  LUNGS:  Scattered expiratory wheeze bilaterally.  No respiratory distress. CHEST:  Gauze dressing over the left thoracotomy site.  CARDIAC:  Regular rate and rhythm.  ABDOMEN:  Soft and nontender.  No organomegaly.  GENITOURINARY:  Normal uncircumcised male.  Testes without palpable mass.  No palpable cervical, clavicular, or inguinal nodes.  There are 0.5 cm soft and mobile axillary  nodes bilaterally.  LABORATORIES:  From May 28:  Hemoglobin 15.1, platelets 224,000. BUN 14, creatinine 0.8, calcium 8.9, albumin 3.5, AST 15, ALT 17, alkaline phosphatase 71, bilirubin 0.5.  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  He has undergone a left upper lobectomy.  I had a long discussion with Benjamin [name] today regarding the diagnosis of nonsmall cell lung cancer and the associated prognosis.  He understands that there is a significant chance for disease recurrence over the next several years despite undergoing the potentially curative lobectomy.  I will recommend that he consider enrollment in the [id number] study which randomizes patients with resected T2 nonsmall cell lung cancers to observation versus a course of adjuvant Taxol/carboplatin chemotherapy.  He states that he will consider enrollment on this clinical trial.  I will arrange for an outpatient follow-up within the next two to three weeks to discuss the details of the studies and to arrange for a meeting with the protocol nurse.  He will have a staging bone scan and brain scan prior to leaving the hospital.  Thank you very much for this consultation. Dictated by:   Leighton Roach Truett Perna, M.D. Attending Physician:  Cameron Proud DD:  08/19/01 TD:  08/20/01 Job: [id number]

## 2010-08-05 NOTE — Op Note (Signed)
   NAME:  Benjamin [name], Benjamin [name] NO.:  [id number]   MEDICAL RECORD NO.:  [id number]                   PATIENT TYPE:  OIB   LOCATION:  2899                                 FACILITY:  [facility]   PHYSICIAN:  Ines Bloomer, M.D.              DATE OF BIRTH:  Aug 18, 1945   DATE OF PROCEDURE:  05/12/2002  DATE OF DISCHARGE:                                 OPERATIVE REPORT   PREOPERATIVE DIAGNOSIS:  Status post left upper lobectomy with possible  recurrent non-small-cell lung cancer.   POSTOPERATIVE DIAGNOSIS:  Status post left upper lobectomy with possible  recurrent non-small-cell lung cancer.   OPERATION:  Video bronchoscopy.   SURGEON:  Ines Bloomer, M.D.   ANESTHESIA:  1% Xylocaine local anesthesia, Cetacaine, and IV sedation.   DESCRIPTION OF PROCEDURE:  After local anesthesia with Cetacaine and  Xylocaine, the video bronchoscope was passed through the mouth.  The cords  functioned normally.  The trachea was normal. The right mainstem, right  upper lobe, right lower lobe orifices were normal.  Going down the left  side, the left mainstem bronchus was normal.  The left upper lobe bronchial  stump appeared to be normal, but biopsies were taken of this.  The left  lower lobe also showed no inter-bronchial lesions.  The CT scan had shown a  left lower lobe recurrence.  The bronchoscope was removed.  The patient  tolerated the procedure well and was returned to recovery room in stable  condition.                                                Ines Bloomer, M.D.    DPB/MEDQ  D:  05/12/2002  T:  05/12/2002  Job:  [id number]

## 2010-08-05 NOTE — Discharge Summary (Signed)
[place]. [facility]  Patient:    Benjamin [name], Benjamin [name]                        MRN: [id number] Adm. Date:  [id number] Disc. Date: [id number] Attending:  Bonnetta Barry CC:         Fritzi Mandes, M.D.  Reuben Likes, M.D.  Blandon A. Patty Sermons, M.D.  Franchot Erichsen, M.D.   Discharge Summary  REASON FOR ADMISSION:  Right adrenal mass.  BRIEF HISTORY:  The patient is a 65 year old white male diagnosed with right adrenal cortical adenoma.  The patient had had a routine chest x-ray in early 2000 showing an abnormal shadow.  CT scan of the chest was performed. Incidental finding of the right adrenal mass was noted.  Followup studies showed interval enlargement.  The patient was worked up by Dr. Mertie Clause, and biochemical testing showed Cushings syndrome.  The patient was referred to surgery for right adrenalectomy for presumed adrenal cortical adenoma with Cushings syndrome.  HOSPITAL COURSE:  The patient was admitted on the morning of surgery.  He was taken to the operating room where he underwent right adrenalectomy without complication.  Postoperative course was remarkably straightforward.  He had good pain control with epidural analgesics.  Nasogastric tube was removed on the second postoperative day.  The patient was started on a clear liquid diet. He progressed to a regular diet and p.o. medications by the fifth postoperative day.  He was prepared for discharge home on the sixth postoperative day.  DISCHARGE PLANNING:  The patient was discharged home today, January 09, 2000, in good condition, tolerating a regular diet, and ambulating independently. He will be seen back at my office at [facility] in five to seven days.  DISCHARGE MEDICATIONS: 1. Klonopin 1 mg t.i.d. 2. Lanoxin 0.25 mg daily. 3. Prednisone 20 mg daily. 4. Pepcid 20 mg daily. 5. Magic Mouth Wash 5 cc q.i.d. x 1 week. 6. Hydrocodone tablets as needed for  pain.  FINAL DIAGNOSIS:  Adrenal cortical adenoma with Cushings syndrome.  CONDITION UPON DISCHARGE:  Good. DD:  01/09/00 TD:  01/09/00 Job: [id number]

## 2010-08-05 NOTE — Op Note (Signed)
[place]  Patient:    Benjamin [name], Benjamin [name]                        MRN: [id number] Proc. Date: 01/03/00 Adm. Date:  [id number] Attending:  Bonnetta Barry CC:         Fritzi Mandes, M.D.  Reuben Likes, M.D.  Richey A. Patty Sermons, M.D.   Operative Report  PREOPERATIVE DIAGNOSIS:  Right adrenal adenoma, Cushings syndrome.  POSTOPERATIVE DIAGNOSIS:  Right adrenal adenoma, Cushings syndrome.  PROCEDURE:  Right adrenalectomy.  SURGEON:  Velora Heckler, M.D.  ASSISTANT:  Donnie Coffin. Samuella Cota, M.D.  ANESTHESIA:  General with postoperative epidural per Dr. Hart Robinsons.  ESTIMATED BLOOD LOSS:  Less than 100 cc.  PREPARATION:  Betadine.  COMPLICATIONS:  None.  INDICATIONS:  The patient is a pleasant 65 year old male who presents with a mass in the right adrenal.  The patient had a routine chest x-ray early in 2000, showing an abnormal shadow.  The patient underwent CT scan of the chest as followup.  Incidentally noted was a nodule in the right adrenal gland with a maximum dimension of 3.5 cm.  A subsequent MRI demonstrated a 4.0 cm mass in the right adrenal, consistent with adrenal adenoma.  Follow-up CT scan showed an increase in size yet again to 4.3 cm.  The patient was referred to Dr. Mertie Clause.  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  The patient now comes to surgery for right adrenalectomy for presumed adrenal adenoma with Cushings syndrome.  DESCRIPTION OF PROCEDURE:  The procedure is done in OR-9 at the [facility].  The patient is brought to the operating room and placed in the supine position on the operating room table.  Following the administration of general anesthesia, the patient is prepped and draped in the usual strict aseptic fashion.  After ascertaining that an adequate level of anesthesia had been obtained, right subcostal incision was made with a #10 blade.   Dissection was carried through the abdominal wall using the electrocautery for hemostasis.  Peritoneal cavity was entered cautiously.  Abdomen is explored. Nasogastric tube is positioned within the stomach.  Both the left and right kidneys palpate normally.  Liver appears normal.  A Bookwalter retractor was placed for exposure.  There are adhesions between the liver and the peritoneum in Morrisons pouch.  These are taken down with the electrocautery.  A Kocher maneuver is performed to mobilize the duodenum and the hepatic flexure of the colon.  The kidney and adrenal are approached from the beneath the liver. Mass is palpable in the posterior portion of the adrenal gland. Retroperitoneum is incised.  The anterior edge of the adrenal gland is identified.  Dissection is carried around the adrenal gland with venous tributaries being divided between medium and large Ligaclips.  Branches to the inferior vena cava are divided between double Ligaclips.  Dissection is carried along the vena cava, providing for mobilization of the entire adrenal gland with the tumor, which measures approximately 4 cm in greatest dimension. The adrenal gland is carefully dissected away from the superior bow of the kidney.  Posterior venous tributaries are divided between large Ligaclips. The gland is completely excised and removed from the wound.  The dissection on the back table demonstrated a yellowish-gray tumor, which appears well encapsulated and very vascular.  It is submitted to pathology for review. Good hemostasis is noted in the adrenal bed.  The  wound is copiously irrigated with warm saline which is evacuated.  A sheet of Surgicel is placed on the adrenal bed and on the undersurface of the liver at the point of adhesions to Morrisons pouch.  Bookwalter retractor is removed.  There is a small serosal tear in the first portion of the duodenum which is closed with interrupted 3-0 silk sutures.  Nasogastric  tube remains properly positioned.  Abdominal wall is closed in two layers with running #1 PDS suture.  Subcutaneous tissues are irrigated.  The skin is closed with stainless steel staples.  Sterile gauze dressings are applied.  Following placement of an epidural catheter, the patient is awakened from anesthesia and brought to the recovery room.  The patient tolerated the procedure well. DD:  01/03/00 TD:  01/03/00 Job: [id number]

## 2010-08-22 ENCOUNTER — Encounter (HOSPITAL_BASED_OUTPATIENT_CLINIC_OR_DEPARTMENT_OTHER): Payer: Medicare Other | Admitting: Oncology

## 2010-08-22 ENCOUNTER — Other Ambulatory Visit: Payer: Self-pay | Admitting: Oncology

## 2010-08-22 DIAGNOSIS — C3410 Malignant neoplasm of upper lobe, unspecified bronchus or lung: Secondary | ICD-10-CM

## 2010-08-22 LAB — CBC WITH DIFFERENTIAL/PLATELET
Eosinophils Absolute: 0.1 10*3/uL (ref 0.0–0.5)
MONO#: 0.4 10*3/uL (ref 0.1–0.9)
NEUT#: 3.8 10*3/uL (ref 1.5–6.5)
Platelets: 130 10*3/uL — ABNORMAL LOW (ref 140–400)
RBC: 4.88 10*6/uL (ref 4.20–5.82)
RDW: 13.3 % (ref 11.0–14.6)
WBC: 5.1 10*3/uL (ref 4.0–10.3)

## 2010-10-03 ENCOUNTER — Other Ambulatory Visit: Payer: Self-pay | Admitting: Oncology

## 2010-10-03 ENCOUNTER — Encounter (HOSPITAL_BASED_OUTPATIENT_CLINIC_OR_DEPARTMENT_OTHER): Payer: Medicare Other | Admitting: Oncology

## 2010-10-03 DIAGNOSIS — C4360 Malignant melanoma of unspecified upper limb, including shoulder: Secondary | ICD-10-CM

## 2010-10-03 DIAGNOSIS — C3410 Malignant neoplasm of upper lobe, unspecified bronchus or lung: Secondary | ICD-10-CM

## 2010-10-03 LAB — CBC WITH DIFFERENTIAL/PLATELET
BASO%: 0.4 % (ref 0.0–2.0)
Basophils Absolute: 0 10e3/uL (ref 0.0–0.1)
EOS%: 2.4 % (ref 0.0–7.0)
Eosinophils Absolute: 0.1 10e3/uL (ref 0.0–0.5)
HCT: 43.8 % (ref 38.4–49.9)
HGB: 14.7 g/dL (ref 13.0–17.1)
LYMPH%: 18.7 % (ref 14.0–49.0)
MCH: 30.4 pg (ref 27.2–33.4)
MCHC: 33.5 g/dL (ref 32.0–36.0)
MCV: 90.7 fL (ref 79.3–98.0)
MONO#: 0.5 10e3/uL (ref 0.1–0.9)
MONO%: 10.3 % (ref 0.0–14.0)
NEUT#: 3.6 10e3/uL (ref 1.5–6.5)
NEUT%: 68.2 % (ref 39.0–75.0)
Platelets: 136 10e3/uL — ABNORMAL LOW (ref 140–400)
RBC: 4.83 10e6/uL (ref 4.20–5.82)
RDW: 13.6 % (ref 11.0–14.6)
WBC: 5.2 10e3/uL (ref 4.0–10.3)
lymph#: 1 10e3/uL (ref 0.9–3.3)

## 2010-12-09 ENCOUNTER — Other Ambulatory Visit: Payer: Self-pay | Admitting: Thoracic Surgery

## 2010-12-09 DIAGNOSIS — C3490 Malignant neoplasm of unspecified part of unspecified bronchus or lung: Secondary | ICD-10-CM

## 2010-12-09 LAB — BLOOD GAS, ARTERIAL
Acid-Base Excess: 2.1 — ABNORMAL HIGH
FIO2: 0.21
pCO2 arterial: 43.4
pO2, Arterial: 86.7

## 2010-12-09 LAB — CBC
HCT: 43.1
MCV: 88.5
Platelets: 153
RDW: 13.2
WBC: 5.6

## 2010-12-21 LAB — GLUCOSE, CAPILLARY: Glucose-Capillary: 108 — ABNORMAL HIGH

## 2011-01-09 ENCOUNTER — Encounter: Payer: Self-pay | Admitting: Thoracic Surgery

## 2011-01-09 DIAGNOSIS — R591 Generalized enlarged lymph nodes: Secondary | ICD-10-CM

## 2011-01-09 DIAGNOSIS — C3490 Malignant neoplasm of unspecified part of unspecified bronchus or lung: Secondary | ICD-10-CM

## 2011-01-09 DIAGNOSIS — F419 Anxiety disorder, unspecified: Secondary | ICD-10-CM | POA: Insufficient documentation

## 2011-01-09 DIAGNOSIS — I4821 Permanent atrial fibrillation: Secondary | ICD-10-CM | POA: Insufficient documentation

## 2011-01-09 DIAGNOSIS — C3400 Malignant neoplasm of unspecified main bronchus: Secondary | ICD-10-CM | POA: Insufficient documentation

## 2011-01-09 DIAGNOSIS — Z85820 Personal history of malignant melanoma of skin: Secondary | ICD-10-CM

## 2011-01-09 DIAGNOSIS — M1990 Unspecified osteoarthritis, unspecified site: Secondary | ICD-10-CM

## 2011-01-09 DIAGNOSIS — I2510 Atherosclerotic heart disease of native coronary artery without angina pectoris: Secondary | ICD-10-CM | POA: Insufficient documentation

## 2011-01-09 DIAGNOSIS — I4891 Unspecified atrial fibrillation: Secondary | ICD-10-CM

## 2011-01-11 ENCOUNTER — Encounter: Payer: Self-pay | Admitting: Thoracic Surgery

## 2011-01-11 ENCOUNTER — Ambulatory Visit
Admission: RE | Admit: 2011-01-11 | Discharge: 2011-01-11 | Disposition: A | Discharge status: Home / Self Care | Payer: Medicare Other | Source: Ambulatory Visit | Attending: Thoracic Surgery | Admitting: Thoracic Surgery

## 2011-01-11 ENCOUNTER — Ambulatory Visit (INDEPENDENT_AMBULATORY_CARE_PROVIDER_SITE_OTHER): Payer: Medicare Other | Admitting: Thoracic Surgery

## 2011-01-11 VITALS — BP 132/80 | HR 55 | Resp 16 | Ht 71.0 in | Wt 220.0 lb

## 2011-01-11 DIAGNOSIS — C3490 Malignant neoplasm of unspecified part of unspecified bronchus or lung: Secondary | ICD-10-CM

## 2011-01-11 NOTE — Progress Notes (Signed)
HPI CT scan today showed no evidence of recurrence of her of his cancer. The right lower lobe groundglass opacity is stable. The left lower lobe area of radiation is also stable. There is no adenopathy. His mother recently passed away. We have taken care of her for a long time. He is has some problems as far as it dry skin and the brain in his feet. And he also says under a lot of stress. We plan to see him back again in 6 months with another CT scan   Current Outpatient Prescriptions  Medication Sig Dispense Refill  . clonazePAM (KLONOPIN) 1 MG tablet Take 1 mg by mouth 2 (two) times daily as needed.           Review of Systems:See HPI   Physical Exam  Cardiovascular: Normal rate, regular rhythm and normal heart sounds.   Pulmonary/Chest: Effort normal and breath sounds normal. No respiratory distress.  Musculoskeletal: He exhibits edema.     Diagnostic Tests: CT scan is negative for recurrence. There is a stable right lower lobe groundglass opacity. 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 Status post radiation for adenocarcinoma left lower lobe   Plan: Followup in 6 months with a CT scan

## 2011-02-01 ENCOUNTER — Telehealth: Payer: Self-pay | Admitting: Oncology

## 2011-02-01 NOTE — Telephone Encounter (Signed)
Unable to reach the pt at the numbers listed on the demographic page to r/s his appts

## 2011-03-23 ENCOUNTER — Telehealth: Payer: Self-pay | Admitting: Cardiovascular Disease

## 2011-03-23 NOTE — Telephone Encounter (Signed)
All Cardiac faxed to [facility] @ [phone] 03/23/11/KM

## 2011-03-27 DIAGNOSIS — K648 Other hemorrhoids: Secondary | ICD-10-CM | POA: Diagnosis not present

## 2011-03-27 DIAGNOSIS — Z86010 Personal history of colonic polyps: Secondary | ICD-10-CM | POA: Diagnosis not present

## 2011-03-27 DIAGNOSIS — C3490 Malignant neoplasm of unspecified part of unspecified bronchus or lung: Secondary | ICD-10-CM | POA: Diagnosis not present

## 2011-04-17 ENCOUNTER — Ambulatory Visit: Payer: Medicare Other | Admitting: Oncology

## 2011-04-17 ENCOUNTER — Telehealth: Payer: Self-pay | Admitting: Oncology

## 2011-04-17 ENCOUNTER — Other Ambulatory Visit: Payer: Self-pay | Admitting: *Deleted

## 2011-04-17 NOTE — Progress Notes (Signed)
Patient FTKA for visit today. Note to scheduler to contact him to make an appointment. Attempted to reach him on home #--no longer in service and mobile # rang busy with no voice mail.

## 2011-04-17 NOTE — Telephone Encounter (Signed)
per 01/28 orders, sent pt a letter to call our office to r/s missed appt on 01/28.  pts phone is not in service

## 2011-04-21 ENCOUNTER — Telehealth: Payer: Self-pay | Admitting: Oncology

## 2011-04-21 NOTE — Telephone Encounter (Signed)
pt called and r/s appt for 01/28 to 03/18

## 2011-05-24 ENCOUNTER — Other Ambulatory Visit: Payer: Self-pay | Admitting: Thoracic Surgery

## 2011-05-24 ENCOUNTER — Encounter: Payer: Self-pay | Admitting: *Deleted

## 2011-05-24 DIAGNOSIS — C3490 Malignant neoplasm of unspecified part of unspecified bronchus or lung: Secondary | ICD-10-CM

## 2011-06-05 ENCOUNTER — Telehealth: Payer: Self-pay | Admitting: Oncology

## 2011-06-05 ENCOUNTER — Ambulatory Visit (HOSPITAL_BASED_OUTPATIENT_CLINIC_OR_DEPARTMENT_OTHER): Payer: Medicare Other | Admitting: Oncology

## 2011-06-05 VITALS — BP 150/98 | HR 70 | Temp 98.0°F | Ht 71.0 in | Wt 223.2 lb

## 2011-06-05 DIAGNOSIS — R0609 Other forms of dyspnea: Secondary | ICD-10-CM

## 2011-06-05 DIAGNOSIS — R0989 Other specified symptoms and signs involving the circulatory and respiratory systems: Secondary | ICD-10-CM

## 2011-06-05 DIAGNOSIS — C3490 Malignant neoplasm of unspecified part of unspecified bronchus or lung: Secondary | ICD-10-CM | POA: Diagnosis not present

## 2011-06-05 NOTE — Telephone Encounter (Signed)
gv pt appt for nov2013 °

## 2011-06-05 NOTE — Progress Notes (Signed)
OFFICE PROGRESS NOTE   INTERVAL HISTORY:   He returns as scheduled. He continues to have exertional dyspnea. He reports malaise. He has no other complaint. A CT the chest on 01-03-2011 revealed no change in the peripheral left upper lobe lesion. A groundglass opacity in the right lower lobe was unchanged. No new pulmonary lesions.  Objective:  Vital signs in last 24 hours:  Blood pressure 150/98, pulse 70, temperature 98 F (36.7 C), temperature source Oral, height 5\' 11"  (1.803 m), weight 223 lb 3.2 oz (101.243 kg).    HEENT: Neck without mass Lymphatics: No cervical, supraclavicular, axillary, or inguinal nodes Resp: Clear bilaterally. No respiratory distress at rest. Cardio: Irregular GI: No hepatosplenomegaly Vascular: Trace pitting edema at the low pretibial area and ankle bilaterally  Skin: Right upper back scar without evidence of recurrent tumor.   Lab Results:  Lab Results  Component Value Date   WBC 5.2 10/03/2010   HGB 14.7 10/03/2010   HCT 43.8 10/03/2010   MCV 90.7 10/03/2010   PLT 136 Platelet count consistent in citrate* 10/03/2010      Medications: I have reviewed the patient's current medications.  Assessment/Plan: 1. 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 2. 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 3. 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 4. 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  A re-staging PET scan June 28, 2009, showed no evidence for disease progression. CT of the chest 01-03-2011 showed no evidence of disease progression. 5. History of questionable small bilateral axillary lymph nodes. 6. History of bradycardia - followed by Dr Elease Hashimoto. 7. Chronic exertional dyspnea following lung surgery. 8. Chronic right "hip" discomfort. 9.          history of mild thrombocytopenia-followed by Dr.  Pete Glatter   Disposition:  He remains in clinical remission from the non-small cell lung cancer. He is scheduled for an x-ray followup with Dr. Edwyna Shell within the next few months. He will return for an office visit here in 8 months.   Lucile Shutters, MD  06/05/2011  12:09 PM

## 2011-06-13 DIAGNOSIS — L989 Disorder of the skin and subcutaneous tissue, unspecified: Secondary | ICD-10-CM | POA: Diagnosis not present

## 2011-06-13 DIAGNOSIS — J40 Bronchitis, not specified as acute or chronic: Secondary | ICD-10-CM | POA: Diagnosis not present

## 2011-06-13 DIAGNOSIS — L259 Unspecified contact dermatitis, unspecified cause: Secondary | ICD-10-CM | POA: Diagnosis not present

## 2011-06-21 DIAGNOSIS — R609 Edema, unspecified: Secondary | ICD-10-CM | POA: Diagnosis not present

## 2011-06-21 DIAGNOSIS — J40 Bronchitis, not specified as acute or chronic: Secondary | ICD-10-CM | POA: Diagnosis not present

## 2011-06-28 ENCOUNTER — Ambulatory Visit
Admission: RE | Admit: 2011-06-28 | Discharge: 2011-06-28 | Disposition: A | Discharge status: Home / Self Care | Payer: Medicare Other | Source: Ambulatory Visit | Attending: Thoracic Surgery | Admitting: Thoracic Surgery

## 2011-06-28 ENCOUNTER — Encounter: Payer: Self-pay | Admitting: Thoracic Surgery

## 2011-06-28 ENCOUNTER — Ambulatory Visit (INDEPENDENT_AMBULATORY_CARE_PROVIDER_SITE_OTHER): Payer: Medicare Other | Admitting: Thoracic Surgery

## 2011-06-28 VITALS — BP 150/93 | HR 74 | Resp 18 | Ht 71.0 in | Wt 220.0 lb

## 2011-06-28 DIAGNOSIS — R918 Other nonspecific abnormal finding of lung field: Secondary | ICD-10-CM | POA: Diagnosis not present

## 2011-06-28 DIAGNOSIS — J984 Other disorders of lung: Secondary | ICD-10-CM | POA: Diagnosis not present

## 2011-06-28 DIAGNOSIS — Z85118 Personal history of other malignant neoplasm of bronchus and lung: Secondary | ICD-10-CM

## 2011-06-28 DIAGNOSIS — C3490 Malignant neoplasm of unspecified part of unspecified bronchus or lung: Secondary | ICD-10-CM

## 2011-06-28 NOTE — Progress Notes (Signed)
HPI patient returns today for followup. CT scan shows no change there still is a scar in the left lung field as well as the groundglass nodule in the right lower lobe. I think we still need to continue to follow him. I will have him come back to see my partner Dr. Dorris Fetch in 6 months with a CT scan of the chest. So far there is been no recurrence of his cancer. Current Outpatient Prescriptions  Medication Sig Dispense Refill  . clonazePAM (KLONOPIN) 1 MG tablet Take 1 mg by mouth 3 (three) times daily as needed.       . doxycycline (DORYX) 100 MG EC tablet Take 100 mg by mouth 2 (two) times daily.      . fish oil-omega-3 fatty acids 1000 MG capsule Take 1 g by mouth daily.      [name] guaiFENesin (MUCINEX) 600 MG 12 hr tablet Take 1,200 mg by mouth 2 (two) times daily.         Review of Systems: Unchanged   Physical Exam lungs are clear to auscultation percussion   Diagnostic Tests: CT scan of the chest shows no change there is scarring in the left mid lung field is stable and may groundglass nodule in the right lower lobe was   Impression: Non-small cell lung cancer x2 status post resection and radiation Plan: Return to see Dr. Dorris Fetch in 6 months. With CT scan of the chest

## 2011-07-19 DIAGNOSIS — D235 Other benign neoplasm of skin of trunk: Secondary | ICD-10-CM | POA: Diagnosis not present

## 2011-07-19 DIAGNOSIS — L570 Actinic keratosis: Secondary | ICD-10-CM | POA: Diagnosis not present

## 2011-07-19 DIAGNOSIS — Z85820 Personal history of malignant melanoma of skin: Secondary | ICD-10-CM | POA: Diagnosis not present

## 2011-08-10 ENCOUNTER — Encounter: Payer: Self-pay | Admitting: Cardiovascular Disease

## 2011-09-05 DIAGNOSIS — F411 Generalized anxiety disorder: Secondary | ICD-10-CM | POA: Diagnosis not present

## 2011-12-06 ENCOUNTER — Other Ambulatory Visit: Payer: Self-pay | Admitting: Thoracic Surgery (Cardiothoracic Vascular Surgery)

## 2011-12-06 ENCOUNTER — Encounter: Payer: Self-pay | Admitting: Cardiology

## 2011-12-06 DIAGNOSIS — C3490 Malignant neoplasm of unspecified part of unspecified bronchus or lung: Secondary | ICD-10-CM

## 2011-12-21 DIAGNOSIS — Z23 Encounter for immunization: Secondary | ICD-10-CM | POA: Diagnosis not present

## 2011-12-30 DIAGNOSIS — Z23 Encounter for immunization: Secondary | ICD-10-CM | POA: Diagnosis not present

## 2012-01-16 ENCOUNTER — Ambulatory Visit (INDEPENDENT_AMBULATORY_CARE_PROVIDER_SITE_OTHER): Payer: Medicare Other | Admitting: Thoracic Surgery (Cardiothoracic Vascular Surgery)

## 2012-01-16 ENCOUNTER — Encounter: Payer: Self-pay | Admitting: Thoracic Surgery (Cardiothoracic Vascular Surgery)

## 2012-01-16 ENCOUNTER — Ambulatory Visit
Admission: RE | Admit: 2012-01-16 | Discharge: 2012-01-16 | Disposition: A | Discharge status: Home / Self Care | Payer: Medicare Other | Source: Ambulatory Visit | Attending: Thoracic Surgery (Cardiothoracic Vascular Surgery) | Admitting: Thoracic Surgery (Cardiothoracic Vascular Surgery)

## 2012-01-16 VITALS — BP 150/74 | HR 48 | Resp 16 | Ht 71.0 in | Wt 223.0 lb

## 2012-01-16 DIAGNOSIS — Z09 Encounter for follow-up examination after completed treatment for conditions other than malignant neoplasm: Secondary | ICD-10-CM

## 2012-01-16 DIAGNOSIS — C3490 Malignant neoplasm of unspecified part of unspecified bronchus or lung: Secondary | ICD-10-CM | POA: Diagnosis not present

## 2012-01-16 NOTE — Progress Notes (Signed)
HPI:  Mr. Barrell returns today for 6 month followup. He has been followed by Dr. 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 More recently he was diagnosed with a carcinoma in the left lower lobe and 2009. That was treated with radiation. He's had no evidence recurrent disease. He's been followed every 6 months with CT scans.  He says that in the interval since his last visit his breathing is about the same. He does get short of breath with exertion, this is a chronic problem. He complained of feeling swelling in his left chest near his axilla. He says he sometimes has pain in his left shoulder which radiates to his left neck. I was unable to pin him down on whether or not that is exertional. He continues to suffer from anxiety.   Past Medical History  Diagnosis Date  . Adenopathy     RIGHT ADRENAL  . Anxiety   . Arthritis     KNEES/HANDS  . Hx of melanoma of skin   . A-fib   . CAD (coronary artery disease)   . 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 A re-staging PET scan June 28, 2009, showed no evidence for disease progression. CT of the chest 12/16/2010 showed no evidence of disease progression.  History of questionable small bilateral axillary lymph nodes.  History of bradycardia - has seen Dr Elease Hashimoto.  Chronic exertional dyspnea following lung surgery.  Chronic right "hip" discomfort.  history of mild thrombocytopenia History of right adrenal resection by Dr. Gerrit Friends   Current Outpatient Prescriptions  Medication Sig  Dispense Refill  . clonazePAM (KLONOPIN) 1 MG tablet Take 1 mg by mouth 3 (three) times daily as needed.       . fish oil-omega-3 fatty acids 1000 MG capsule Take 1 g by mouth daily.        Physical Exam BP 150/74  Pulse 48  Resp 16  Ht 5\' 11"  (1.803 m)  Wt 223 lb (101.152 kg)  BMI 31.10 kg/m2  SpO18 21% 66 year old male in no acute distress General well-developed well-nourished Neck supple without thyromegaly or bruits, no palpable cervical or supraclavicular adenopathy No axillary adenopathy Lungs clear with diminished breath sounds bilaterally Cardiac regular rate and rhythm normal S1 and S2 2+ edema right ankle, 1+ edema left ankle  Diagnostic Tests:  CT chest 01/16/12  Clinical Data: 6 months follow up examination for lung cancer.  CT CHEST WITHOUT CONTRAST  Technique: Multidetector CT imaging of the chest was performed  following the standard protocol without IV contrast.  Comparison: Chest CT 06/28/2011.  Findings:  Mediastinum: Heart size is normal. There is no significant  pericardial fluid, thickening or pericardial calcification. There  is atherosclerosis of the thoracic aorta, the great vessels of the  mediastinum and the coronary arteries, including calcified  atherosclerotic plaque in the left main, left anterior descending,  left circumflex and right coronary arteries. No pathologically  enlarged mediastinal or hilar lymph nodes. Please note that  accurate exclusion of hilar adenopathy is limited on noncontrast CT  scans. Numerous bilateral calcified mediastinal and hilar lymph  nodes are noted, likely related to old granulomatous disease.  Esophagus is unremarkable in appearance.  Lungs/Pleura: Postoperative changes of left upper lobectomy are  again noted. There is again a large wedge-shaped pleural-based  opacity in the periphery of the superior segment of the left lower  lobe which currently measures approximately 6.2 x 3.1 cm. This  finding has been  similar in size and appearance compared to remote  prior studies from 12/2009, and is most compatible with an area of  mass-like fibrosis. Small focus of ground-glass attenuation in the  periphery of the right lower lobe is also unchanged. Linear  scarring in the right upper lobe (unchanged). Otherwise, there are  no definite suspicious appearing pulmonary nodules or masses  identified on today's examination. No acute consolidative airspace  disease. No pleural effusions.  Upper Abdomen: Surgical clips in the right upper retroperitoneum,  likely from prior adrenalectomy.  Musculoskeletal: There are no aggressive appearing lytic or blastic  lesions noted in the visualized portions of the skeleton.  IMPRESSION:  1. Stable postoperative and post treatment related changes in the  left hemithorax, without evidence to suggest local recurrence of  disease or metastatic disease in the thorax on today's examination.  2. Atherosclerosis, including three-vessel coronary artery disease.  Assessment for potential risk factor modification, dietary therapy  or pharmacologic therapy may be warranted, if clinically indicated.   Impression: 66 year old gentleman with a history of multiple lung cancers most recently being diagnosed and treated in 2009. This was treated with radiation therapy. He has some scarring in the left lung from that and has been followed with serial CT scans. His CT today shows no evidence of recurrent disease. All appears stable from a lung cancer and respiratory standpoint.  He was noted on CT scan to have evidence of coronary disease. He has been dilated in the past by Dr. Elease Hashimoto and had a catheterization in 2009. He has not had any workup since that time. I will defer to Dr. Pete Glatter as to whether he needs any further evaluation for that. He also had a lot of questions regarding peripheral edema, I also recommended that he contact Dr. Laverle Hobby office to have that  evaluated.  Plan: Return in 6 months with CT of chest

## 2012-01-24 DIAGNOSIS — Z85820 Personal history of malignant melanoma of skin: Secondary | ICD-10-CM | POA: Diagnosis not present

## 2012-01-24 DIAGNOSIS — D235 Other benign neoplasm of skin of trunk: Secondary | ICD-10-CM | POA: Diagnosis not present

## 2012-01-30 ENCOUNTER — Telehealth: Payer: Self-pay | Admitting: Oncology

## 2012-01-30 ENCOUNTER — Ambulatory Visit: Payer: Medicare Other | Admitting: Oncology

## 2012-01-30 ENCOUNTER — Ambulatory Visit (HOSPITAL_BASED_OUTPATIENT_CLINIC_OR_DEPARTMENT_OTHER): Payer: Medicare Other | Admitting: Oncology

## 2012-01-30 VITALS — BP 141/92 | HR 92 | Resp 22

## 2012-01-30 DIAGNOSIS — C4360 Malignant melanoma of unspecified upper limb, including shoulder: Secondary | ICD-10-CM | POA: Diagnosis not present

## 2012-01-30 DIAGNOSIS — C3410 Malignant neoplasm of upper lobe, unspecified bronchus or lung: Secondary | ICD-10-CM | POA: Diagnosis not present

## 2012-01-30 DIAGNOSIS — C3490 Malignant neoplasm of unspecified part of unspecified bronchus or lung: Secondary | ICD-10-CM

## 2012-01-30 NOTE — Progress Notes (Signed)
   [facility]    OFFICE PROGRESS NOTE   INTERVAL HISTORY:   He returns as scheduled. Stable exertional dyspnea. No other complaint. He is now seeing Dr. Dorris [name] for followup of the lung cancer. A CT of the chest on 01/16/2012 revealed no evidence of progressive cancer. Atherosclerosis of the coronary arteries was noted.  Objective:  Vital signs in last 24 hours:  There were no vitals taken for this visit.    HEENT: Neck without mass Lymphatics: No cervical, supraclavicular, axillary, or inguinal nodes Resp: Bronchial sounds at the left upper posterior chest, no respiratory distress Cardio: Irregular GI: No hepatomegaly, nontender Vascular: Trace edema at the right greater than left lower leg  Skin: Right upper back scar without evidence of recurrent tumor., Soft mobile masslike area medial to the right thoracotomy scar-? Lipoma. Nodular 2 cm oblong subcutaneous lesion in the right lower abdomen      Medications: I have reviewed the patient's current medications.  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  2. 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  3. 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  4. 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 A re-staging PET scan June 28, 2009, showed no evidence for disease progression. CT of the chest 01/11/2011 showed no evidence of disease progression. CT 01/16/2012 with no evidence of progressive disease. 5. History of questionable small bilateral axillary lymph nodes.  6. History of bradycardia - followed by Dr Benjamin [name].  7. Chronic exertional dyspnea following lung surgery.  8. Chronic right "hip" discomfort.  9. history of mild thrombocytopenia-followed by Dr. Pete [name]  10. Coronary artery disease noted on the CT 01/16/2012-I recommended he  followup with Dr. Pete [name] or Dr.Nahser   Disposition:  He remains in clinical remission from non-small cell lung cancer. He will continue x-ray followup with Dr. Dorris [name]. [name] is will return for an office visit here in 9 months. 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   Thornton Papas, MD  01/30/2012  1:45 PM

## 2012-01-30 NOTE — Telephone Encounter (Signed)
appts made and printed for pt aom °

## 2012-01-30 NOTE — Telephone Encounter (Signed)
appts made and printed for  Pt aom °

## 2012-03-04 DIAGNOSIS — F411 Generalized anxiety disorder: Secondary | ICD-10-CM | POA: Diagnosis not present

## 2012-04-19 DIAGNOSIS — Z79899 Other long term (current) drug therapy: Secondary | ICD-10-CM | POA: Diagnosis not present

## 2012-04-19 DIAGNOSIS — Z0000 Encounter for general adult medical examination without abnormal findings: Secondary | ICD-10-CM | POA: Diagnosis not present

## 2012-04-19 DIAGNOSIS — Z1331 Encounter for screening for depression: Secondary | ICD-10-CM | POA: Diagnosis not present

## 2012-04-19 DIAGNOSIS — E7800 Pure hypercholesterolemia, unspecified: Secondary | ICD-10-CM | POA: Diagnosis not present

## 2012-04-19 DIAGNOSIS — R002 Palpitations: Secondary | ICD-10-CM | POA: Diagnosis not present

## 2012-06-11 ENCOUNTER — Other Ambulatory Visit: Payer: Self-pay | Admitting: *Deleted

## 2012-06-11 DIAGNOSIS — C3490 Malignant neoplasm of unspecified part of unspecified bronchus or lung: Secondary | ICD-10-CM

## 2012-06-21 DIAGNOSIS — C3490 Malignant neoplasm of unspecified part of unspecified bronchus or lung: Secondary | ICD-10-CM | POA: Diagnosis not present

## 2012-06-21 LAB — BUN: BUN: 13 mg/dL (ref 6–23)

## 2012-06-21 LAB — CREATININE, SERUM: Creat: 0.97 mg/dL (ref 0.50–1.35)

## 2012-07-02 ENCOUNTER — Emergency Department (HOSPITAL_COMMUNITY): Payer: Medicare Other

## 2012-07-02 ENCOUNTER — Other Ambulatory Visit: Payer: Medicare Other

## 2012-07-02 ENCOUNTER — Ambulatory Visit (INDEPENDENT_AMBULATORY_CARE_PROVIDER_SITE_OTHER): Payer: Medicare Other | Admitting: Thoracic Surgery (Cardiothoracic Vascular Surgery)

## 2012-07-02 ENCOUNTER — Encounter: Payer: Self-pay | Admitting: Thoracic Surgery (Cardiothoracic Vascular Surgery)

## 2012-07-02 ENCOUNTER — Ambulatory Visit: Payer: Medicare Other | Admitting: Thoracic Surgery (Cardiothoracic Vascular Surgery)

## 2012-07-02 ENCOUNTER — Emergency Department (HOSPITAL_COMMUNITY)
Admission: EM | Admit: 2012-07-02 | Discharge: 2012-07-02 | Disposition: A | Discharge status: Home / Self Care | Payer: Medicare Other | Attending: Emergency Medicine | Admitting: Emergency Medicine

## 2012-07-02 ENCOUNTER — Ambulatory Visit
Admission: RE | Admit: 2012-07-02 | Discharge: 2012-07-02 | Disposition: A | Discharge status: Home / Self Care | Payer: Medicare Other | Source: Ambulatory Visit | Attending: Thoracic Surgery (Cardiothoracic Vascular Surgery) | Admitting: Thoracic Surgery (Cardiothoracic Vascular Surgery)

## 2012-07-02 ENCOUNTER — Encounter (HOSPITAL_COMMUNITY): Payer: Self-pay | Admitting: *Deleted

## 2012-07-02 VITALS — BP 152/80 | HR 47 | Resp 20 | Ht 71.0 in | Wt 223.0 lb

## 2012-07-02 DIAGNOSIS — F411 Generalized anxiety disorder: Secondary | ICD-10-CM | POA: Diagnosis not present

## 2012-07-02 DIAGNOSIS — R5381 Other malaise: Secondary | ICD-10-CM | POA: Insufficient documentation

## 2012-07-02 DIAGNOSIS — D491 Neoplasm of unspecified behavior of respiratory system: Secondary | ICD-10-CM | POA: Insufficient documentation

## 2012-07-02 DIAGNOSIS — F419 Anxiety disorder, unspecified: Secondary | ICD-10-CM

## 2012-07-02 DIAGNOSIS — D1739 Benign lipomatous neoplasm of skin and subcutaneous tissue of other sites: Secondary | ICD-10-CM | POA: Diagnosis not present

## 2012-07-02 DIAGNOSIS — Z87891 Personal history of nicotine dependence: Secondary | ICD-10-CM | POA: Diagnosis not present

## 2012-07-02 DIAGNOSIS — Z8739 Personal history of other diseases of the musculoskeletal system and connective tissue: Secondary | ICD-10-CM | POA: Insufficient documentation

## 2012-07-02 DIAGNOSIS — R202 Paresthesia of skin: Secondary | ICD-10-CM

## 2012-07-02 DIAGNOSIS — Z8639 Personal history of other endocrine, nutritional and metabolic disease: Secondary | ICD-10-CM | POA: Insufficient documentation

## 2012-07-02 DIAGNOSIS — R509 Fever, unspecified: Secondary | ICD-10-CM | POA: Diagnosis not present

## 2012-07-02 DIAGNOSIS — I2510 Atherosclerotic heart disease of native coronary artery without angina pectoris: Secondary | ICD-10-CM | POA: Diagnosis not present

## 2012-07-02 DIAGNOSIS — R61 Generalized hyperhidrosis: Secondary | ICD-10-CM | POA: Diagnosis not present

## 2012-07-02 DIAGNOSIS — R209 Unspecified disturbances of skin sensation: Secondary | ICD-10-CM | POA: Insufficient documentation

## 2012-07-02 DIAGNOSIS — I4891 Unspecified atrial fibrillation: Secondary | ICD-10-CM | POA: Insufficient documentation

## 2012-07-02 DIAGNOSIS — Z79899 Other long term (current) drug therapy: Secondary | ICD-10-CM | POA: Insufficient documentation

## 2012-07-02 DIAGNOSIS — C3490 Malignant neoplasm of unspecified part of unspecified bronchus or lung: Secondary | ICD-10-CM | POA: Diagnosis not present

## 2012-07-02 DIAGNOSIS — Z85118 Personal history of other malignant neoplasm of bronchus and lung: Secondary | ICD-10-CM

## 2012-07-02 DIAGNOSIS — R5383 Other fatigue: Secondary | ICD-10-CM | POA: Insufficient documentation

## 2012-07-02 DIAGNOSIS — R0789 Other chest pain: Secondary | ICD-10-CM | POA: Diagnosis present

## 2012-07-02 DIAGNOSIS — I498 Other specified cardiac arrhythmias: Secondary | ICD-10-CM | POA: Insufficient documentation

## 2012-07-02 DIAGNOSIS — Z85820 Personal history of malignant melanoma of skin: Secondary | ICD-10-CM | POA: Insufficient documentation

## 2012-07-02 DIAGNOSIS — Z862 Personal history of diseases of the blood and blood-forming organs and certain disorders involving the immune mechanism: Secondary | ICD-10-CM | POA: Diagnosis not present

## 2012-07-02 LAB — CBC
MCH: 30.8 pg (ref 26.0–34.0)
MCHC: 35.5 g/dL (ref 30.0–36.0)
Platelets: 142 10*3/uL — ABNORMAL LOW (ref 150–400)
RDW: 12.8 % (ref 11.5–15.5)

## 2012-07-02 LAB — POCT I-STAT TROPONIN I: Troponin i, poc: 0 ng/mL (ref 0.00–0.08)

## 2012-07-02 LAB — POCT I-STAT, CHEM 8
Creatinine, Ser: 0.9 mg/dL (ref 0.50–1.35)
HCT: 49 % (ref 39.0–52.0)
Hemoglobin: 16.7 g/dL (ref 13.0–17.0)
Sodium: 141 mEq/L (ref 135–145)
TCO2: 30 mmol/L (ref 0–100)

## 2012-07-02 MED ORDER — NITROGLYCERIN 0.4 MG SL SUBL
0.4000 mg | SUBLINGUAL_TABLET | SUBLINGUAL | Status: DC | PRN
  Administered 2012-07-02: 0.4 mg via SUBLINGUAL
  Filled 2012-07-02: qty 25

## 2012-07-02 MED ORDER — MORPHINE SULFATE 4 MG/ML IJ SOLN
4.0000 mg | Freq: Once | INTRAMUSCULAR | Status: AC
  Administered 2012-07-02: 4 mg via INTRAVENOUS
  Filled 2012-07-02: qty 1

## 2012-07-02 MED ORDER — IOHEXOL 300 MG/ML  SOLN
75.0000 mL | Freq: Once | INTRAMUSCULAR | Status: AC | PRN
  Administered 2012-07-02: 75 mL via INTRAVENOUS

## 2012-07-02 MED ORDER — ASPIRIN 81 MG PO CHEW
324.0000 mg | CHEWABLE_TABLET | Freq: Once | ORAL | Status: AC
  Administered 2012-07-02: 324 mg via ORAL
  Filled 2012-07-02: qty 4

## 2012-07-02 NOTE — ED Provider Notes (Signed)
History     CSN: [id number]  Arrival date & time 07/02/12  1355   First MD Initiated Contact with Patient 07/02/12 1409      Chief Complaint  Patient presents with  . Chest Pain    (Consider location/radiation/quality/duration/timing/severity/associated sxs/prior treatment) HPI Comments: Mr/ Benjamin [name] presents with his wife for evaluation at the request of his oncologist.  He had a routine 6 month follow-up appointment and imaging after treatment for lung cancer today.  While at the appointment, he explained that he awoke today with a generalized ill feeling.  He reports feeling anxious, intermittently sweaty, and numbness along his left lower lateral face/jaw, neck, and chest.  He vehemently denies overt pain, palpitations, SOB, indigestion, NVD, back pain, and fever.  Patient is a 67 y.o. male presenting with chest pain. The history is provided by the patient. No language interpreter was used.  Chest Pain Pain location:  L chest Pain quality: not aching, not burning, not crushing, not dull, not hot, no pressure, not sharp, not stabbing, not tearing, not throbbing and no tightness   Pain quality comment:  Just a dull "numbness" Pain radiates to:  L jaw Pain radiates to the back: no   Pain severity:  Mild Onset quality:  Unable to specify Timing:  Constant Chronicity:  New Context: at rest   Context: not breathing, no drug use, not eating, not lifting, no movement, not raising an arm, no stress and no trauma   Relieved by:  Nothing Worsened by:  Nothing tried Ineffective treatments:  None tried Associated symptoms: anxiety, diaphoresis, fatigue and numbness   Associated symptoms: no abdominal pain, no anorexia, no back pain, no cough, no dizziness, no fever, no headache, no palpitations, no shortness of breath, not vomiting and no weakness   Risk factors: coronary artery disease   Risk factors: no surgery     Past Medical History  Diagnosis Date  . Adenopathy     RIGHT ADRENAL   . Anxiety   . Arthritis     KNEES/HANDS  . Hx of melanoma of skin   . A-fib   . Lung cancer     NON-SMALL-CELL   . CAD (coronary artery disease)     Past Surgical History  Procedure Laterality Date  . Lobectomy left upper lobe  08/15/2001  . Adrenalextomy  02/2000  . Right vats, mini thoracotomy, wedge resection of right upper  01/18/2004    No family history on file.  History  Substance Use Topics  . Smoking status: Former Smoker    Quit date: 08/15/2001  . Smokeless tobacco: Not on file  . Alcohol Use: No      Review of Systems  Constitutional: Positive for diaphoresis and fatigue. Negative for fever, chills, activity change and appetite change.  HENT: Negative for congestion, sore throat, rhinorrhea and neck pain.   Eyes: Negative for photophobia and visual disturbance.  Respiratory: Negative for cough, chest tightness, shortness of breath and wheezing.   Cardiovascular: Negative for chest pain and palpitations.  Gastrointestinal: Negative for vomiting, abdominal pain, diarrhea and anorexia.  Genitourinary: Negative for dysuria, urgency, frequency and hematuria.  Musculoskeletal: Negative for myalgias, back pain, joint swelling and arthralgias.  Skin: Negative for color change, pallor, rash and wound.  Neurological: Positive for numbness. Negative for dizziness, tremors, syncope, weakness, light-headedness and headaches.    Allergies  Codeine  Home Medications   Current Outpatient Rx  Name  Route  Sig  Dispense  Refill  . clonazePAM (KLONOPIN) 1  MG tablet   Oral   Take 1 mg by mouth 3 (three) times daily as needed for anxiety.          . fish oil-omega-3 fatty acids 1000 MG capsule   Oral   Take 1 g by mouth daily.           BP 143/78  Pulse 50  Temp(Src) 98.8 F (37.1 C) (Oral)  Resp 16  Ht 5\' 11"  (1.803 m)  Wt 225 lb (102.059 kg)  BMI 31.39 kg/m2  SpO2 97%  Physical Exam  Nursing note and vitals reviewed. Constitutional: He is oriented  to person, place, and time. He appears well-developed and well-nourished. No distress.  HENT:  Head: Normocephalic and atraumatic.  Right Ear: External ear normal.  Left Ear: External ear normal.  Nose: Nose normal.  Mouth/Throat: Oropharynx is clear and moist. No oropharyngeal exudate.  Eyes: Conjunctivae and EOM are normal. Pupils are equal, round, and reactive to light. Right eye exhibits no discharge. Left eye exhibits no discharge. No scleral icterus.  Neck: Normal range of motion. Neck supple. No JVD present. No tracheal deviation present.  Cardiovascular: Regular rhythm, normal heart sounds, intact distal pulses and normal pulses.   No extrasystoles are present. Bradycardia present.  PMI is not displaced.  Exam reveals no gallop and no decreased pulses.   No murmur heard. Pulmonary/Chest: Effort normal and breath sounds normal. No stridor. No respiratory distress. He has no wheezes. He has no rales. He exhibits no tenderness.  Abdominal: Soft. Bowel sounds are normal. He exhibits no distension and no mass. There is no tenderness. There is no rebound and no guarding.  Musculoskeletal: Normal range of motion. He exhibits no edema and no tenderness.  Lymphadenopathy:    He has no cervical adenopathy.  Neurological: He is alert and oriented to person, place, and time. No cranial nerve deficit.  Skin: Skin is warm and dry. No rash noted. He is not diaphoretic. No erythema. No pallor.  Psychiatric: He has a normal mood and affect. His behavior is normal.    ED Course  Procedures (including critical care time)  Labs Reviewed  CBC - Abnormal; Notable for the following:    Platelets 142 (*)    All other components within normal limits  POCT I-STAT, CHEM 8 - Abnormal; Notable for the following:    Glucose, Bld 106 (*)    All other components within normal limits  POCT I-STAT TROPONIN I   Ct Chest W Contrast  07/02/2012  *RADIOLOGY REPORT*  Clinical Data: Bilateral lung cancer.  CT  CHEST WITH CONTRAST  Technique:  Multidetector CT imaging of the chest was performed following the standard protocol during bolus administration of intravenous contrast.  Contrast: 75mL OMNIPAQUE IOHEXOL 300 MG/ML  SOLN  Comparison: 01/16/2012  Findings: Heart is normal size.  Aorta is normal caliber. No mediastinal, hilar, or axillary adenopathy.  Postoperative changes bilaterally.  Calcified right hilar and mediastinal lymph nodes.  Calcifications in the coronary arteries again noted, unchanged.  Again noted is the wedge-shaped pleural-based density peripherally in the left lung.  This currently measures 5.8 x 2.9 cm compared to 6.2 x 3.1 cm previously.  Stable ground-glass area in the right lower lobe.  No new or enlarging nodules.  No pleural effusions.  Visualized thyroid and chest wall soft tissues unremarkable. Imaging into the upper abdomen shows no acute findings.  Surgical clips in the right upper retroperitoneum, stable.  No acute or focal bony abnormality.  Degenerative changes  in the thoracic spine.  IMPRESSION: Stable postoperative changes bilaterally.  Stable pleural based wedge-shaped density in the left lung peripherally.  Coronary artery disease.   Original Report Authenticated By: Charlett Nose, M.D.      No diagnosis found.   Date: 07/02/2012 @ 1404  Rate: 48 bpm  Rhythm: sinus bradycardia  QRS Axis: normal  Intervals: PR prolonged  ST/T Wave abnormalities: normal  Conduction Disutrbances:first-degree A-V block   Narrative Interpretation:   Old EKG Reviewed: changes noted (PR int, bradycardia is not acute)      MDM  Pt presents for evaluation of numbness or discomfort of the left upper chest, lower face, and jaw.  It has been associated with anxiety and sweating.  He appears nontoxic, note bradycardia and elevated BP, NAD.  His oncologist was initially concerned that this may be secondary to a CVA.  He has no focal neurologic deficit other than the c/o numbness.  He has  evidence of CAD on a CT scan of the chest performed today.  Will obtain routine cardiac labs and administer ntg, asa, and morphine.  Will contact his cardiologist as the results become available for further direction.  1640.  Pt stable, NAD.  Trop negative x1.  The CT obtained by oncology demonstrates no infiltrate, effusion, or edema.  Remainder of labs are unremarkable.  Consulted the on-call [facility] provider for Dr. Elease Hashimoto.  A bedside evaluation will be performed and recommendations will be provided.         Tobin Chad, MD 07/02/12 [phone]

## 2012-07-02 NOTE — Consult Note (Signed)
Consult Note   Patient ID: Benjamin [name] MRN: [id number], DOB/AGE: 1945/09/14   Admit date: 07/02/2012 Date of Consult: 07/02/2012   Primary Physician: Ginette Otto, MD Primary Cardiologist: Katherina Right, MD  HPI: Benjamin [name] is a 67 y.o. male CAD s/p prior caths- chronically occluded dLCx, medically managed, atrial fibrillation, h/o NSCLC (see below) who was sent from [facility] office after c/o new onset facial and chest numbness.   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 He is followed by [facility] every 6 months and presented for follow-up today. His chronic shortness of breath is noted to be unchanged, however he didn't endorse new facial and left-sided chest numbness. No evidence of slurred speech or facial droop. Given these new symptoms, the recommendation was made to present to the ED.  He does note chronic chest tightness which occurs only when he carries in heavy objects from the car to his home. He is able to pursue is typically daily activities without incident. He denies worsening SOB/DOB, LE edema, PND, orthopnea, palpitations or syncope.   He has been seen by Dr. Elease Hashimoto in the past, most recently in 11/2006 for bradycardia and unstable angina. AVN blockers were held. He underwent cardiac catheterization revealing chronic subtotally occluded LCx (known from 2001) which recanalized. 10-20% ostial left main, 40% mid LAD, 50% prox D1, 75-90% LCx; EF 60%. Medical management recommended.   There, EKG revealed marked sinus bradycardia with 1st degree AVB. Prior EKG in 2008 showed sinus bradycardia, rate 38 bpm. POC trop-I. BMET unremarkable. CBC- PLT 142 K, chronic, otherwise unremarkable. CT chest reveals evidence of CAD, stable postoperative bilateral changes, stable pleural based wedge-shaped density in the left lung peripherally.     Problem List: Past Medical History    Diagnosis Date  . Adenopathy     RIGHT ADRENAL  . Anxiety   . Arthritis     KNEES/HANDS  . Hx of melanoma of skin   . A-fib   . Lung cancer     NON-SMALL-CELL   . CAD (coronary artery disease)     Past Surgical History  Procedure Laterality Date  . Lobectomy left upper lobe  08/15/2001  . Adrenalextomy  02/2000  . Right vats, mini thoracotomy, wedge resection of right upper  01/18/2004     Allergies:  Allergies  Allergen Reactions  . Codeine     Home Medications: Prior to Admission medications   Medication Sig Start Date End Date Taking? Authorizing Provider  clonazePAM (KLONOPIN) 1 MG tablet Take 1 mg by mouth 3 (three) times daily as needed for anxiety.    Yes Historical Provider, MD  fish oil-omega-3 fatty acids 1000 MG capsule Take 1 g by mouth daily.   Yes Historical Provider, MD    Inpatient Medications:     (Not in a hospital admission)  No family history on file.   History   Social History  . Marital Status: Single    Spouse Name: N/A    Number of Children: N/A  . Years of Education: N/A   Occupational History  . Not on file.   Social History Main Topics  . Smoking status: Former Smoker    Quit date: 08/15/2001  . Smokeless tobacco: Not on file  . Alcohol Use: No  . Drug Use: No  . Sexually Active: Not on file   Other Topics Concern  . Not on file  Social History Narrative  . No narrative on file    Review of Systems: General: negative for chills, fever, night sweats or weight changes.  Cardiovascular: positive chronic shortness of breath, negative for chest pain, dyspnea on exertion, edema, orthopnea, palpitations, paroxysmal nocturnal dyspnea Dermatological: negative for rash Respiratory: negative for cough or wheezing Urologic: negative for hematuria Abdominal: negative for nausea, vomiting, diarrhea, bright red blood per rectum, melena, or hematemesis Neurologic:  negative for visual changes, syncope, or dizziness  All other  systems reviewed and are otherwise negative except as noted above.  Physical Exam: Blood pressure 157/73, pulse 47, temperature 98.8 F (37.1 C), temperature source Oral, resp. rate 17, height 5\' 11"  (1.803 m), weight 225 lb (102.059 kg), SpO2 97.00%.    General: Well developed, well nourished, in no acute distress. Head: Normocephalic, atraumatic, sclera non-icteric, no xanthomas, nares are without discharge. Neck: Negative for carotid bruits. JVD not elevated. Lungs: Decreased breath sounds to posterior, superior lung fields. No appreciable wheezing, rales or rhonchi. reathing is unlabored. Heart:  Bradycardic with S1 S2. No murmurs, rubs, or gallops appreciated. Abdomen: Soft, non-tender, non-distended with normoactive bowel sounds. No hepatomegaly. No rebound/guarding. No obvious abdominal masses. Msk:   Strength and tone appears normal for age. Extremities: Trace bilateral nonpitting edema. No clubbing or cyanosis.  Distal pedal pulses are 2+ and equal bilaterally. Neuro: Alert and oriented X 3. Moves all extremities spontaneously. Psych:  Responds to questions appropriately with a normal affect.  Labs: Recent Labs     07/02/12  1412  07/02/12  1423  WBC  6.4   --   HGB  16.0  16.7  HCT  45.1  49.0  MCV  86.7   --   PLT  142*   --    Recent Labs Lab 07/02/12 1423  NA 141  K 4.2  CL 103  BUN 10  CREATININE 0.90  GLUCOSE 106*   Radiology/Studies: Ct Chest W Contrast  07/02/2012  *RADIOLOGY REPORT*  Clinical Data: Bilateral lung cancer.  CT CHEST WITH CONTRAST  Technique:  Multidetector CT imaging of the chest was performed following the standard protocol during bolus administration of intravenous contrast.  Contrast: 75mL OMNIPAQUE IOHEXOL 300 MG/ML  SOLN  Comparison: 01/16/2012  Findings: Heart is normal size.  Aorta is normal caliber. No mediastinal, hilar, or axillary adenopathy.  Postoperative changes bilaterally.  Calcified right hilar and mediastinal lymph nodes.   Calcifications in the coronary arteries again noted, unchanged.  Again noted is the wedge-shaped pleural-based density peripherally in the left lung.  This currently measures 5.8 x 2.9 cm compared to 6.2 x 3.1 cm previously.  Stable ground-glass area in the right lower lobe.  No new or enlarging nodules.  No pleural effusions.  Visualized thyroid and chest wall soft tissues unremarkable. Imaging into the upper abdomen shows no acute findings.  Surgical clips in the right upper retroperitoneum, stable.  No acute or focal bony abnormality.  Degenerative changes in the thoracic spine.  IMPRESSION: Stable postoperative changes bilaterally.  Stable pleural based wedge-shaped density in the left lung peripherally.  Coronary artery disease.   Original Report Authenticated By: Charlett Nose, M.D.     EKG: Sinus bradycardia, 48 bpm, 1st degree AVB, no ST/T changes  ASSESSMENT AND PLAN:   67 y.o. male CAD s/p prior caths- chronically occluded dLCx, medically managed, atrial fibrillation, h/o NSCLC (see below) who was sent from [facility] office after c/o new onset facial and chest numbness.   1. CAD 2. H/o  NSCLC s/p prior lobectomies, radiation  3. H/o atrial fibrillation  4. Anxiety 5. Sinus bradycardia 6. Left face, neck, chest numbness  Patient presents to the ED after an episode of left-sided face and chest numbness. He denies overt pain, pressure, tightness or discomfort. The episode this morning was clearly different from the consistently reproducible chest tightness when carrying heavy objects at home. Suspect he does have underlying chronic stable angina given his known underlying CAD. Left-sided face and chest discomfort does not likely represent worsening cardiac ischemia. 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 Cannot definitively rule out CVA/TIA and would recommend noncontrast head CT. His bradycardia has been stable since 2008, and he is asymptomatic with  this. Will not initiate AVN blockers. He does have a questionable history of atrial fibrillation. At this point, CHADS2 score would be 1. Hold off on anticoagulation. Continue home ASA regimen.     Signed, R. Hurman Horn, PA-C 07/02/2012, 5:56 PM    Attending Note:   The patient was seen and examined.  Agree with assessment and plan as noted above.  Changes made to the above note as needed.  Elijah Birk is a 67 yo who I have known for many years.  He has chronic CAD and has had chronic stable exertional angina for years ( carrying the 2nd case of bottled water in from the car, dragging the barrel of sticks and leaves up the hill in the back yard).  Today he woke up with some unusual numbness of his face, neck and chest wall.  These symptoms are not at all similar to his angina symptoms.  They have not worsened through the day  ECG shows sinus brady with no St or T wave changes.    Troponin is negative.  Imp:   His presenting symptoms today are not related to his heart.  He does have chronic stable angina and should come to see me in the office in the next several months.  It is possible that this numbness represents a neuropathy from his radiation or his surgical procedures.  CT scan of the chest looks unchanged.    I do not think he needs to be admitted for any cardiac issues.   He may need close follow up with his medical doctor.   Vesta Mixer, Montez Hageman., MD, [facility] 07/02/2012, 7:22 PM

## 2012-07-02 NOTE — ED Notes (Addendum)
Pt c/o numbess ot left chest and shoulder that started when he woke up. Pt sts he isn't experiencing the numbness right now. Pt sts he was going to the doctor today but they told him to go straight to the ED for further evaluation. Pt reports he has hx of slower HR. Pt sts that he has had a heart cath before and it was clean but that was in 2009. Pt sts he does feel sob but not more than normal, had part of lung removed in 2003 and again in 2005. Pt denies numbness/tingling to any other part of body. Pt denies HA. Pt sts he has been feeling slightly dizzy. Pt in nad, skin warm and dry, resp e/u.

## 2012-07-02 NOTE — ED Provider Notes (Signed)
67yo M, c/o waking up this morning with left anterior chest wall "numbness" which radiated into his neck.  Was associated with anxiety and sweating.  Denies palpitations, no SOB, no abd pain, no N/V/D, no facial droop, no slurred speech, no visual changes, no focal motor weakness, no tingling/numbness in extremities. Pt was eval by his [facility] surgeon today for routine f/u, had CT chest completed, then sent to the ED for further eval.  CT chest with DJD TS, CAD, and known lung CA.  EKG unchanged from previous, troponin negative.  VSS, A&O, resps easy, no facial droop, speech clear, moves all ext well without apparent gross focal motor or sensory deficits.  Cards Dr. 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  Pt states he wants to go home, but would like to have a CT head before he leaves.  Will order.    Ct Head Wo Contrast 07/02/2012  *RADIOLOGY REPORT*  Clinical Data: 67 year old male left chest numbness.  History of lung cancer.  CT HEAD WITHOUT CONTRAST  Technique:  Contiguous axial images were obtained from the base of the skull through the vertex without contrast.  Comparison: PET CT 06/28/2009.  Head CT 01/21/2004.  Findings: Visualized paranasal sinuses and mastoids are clear. 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  No acute orbit or scalp soft tissue findings.  Calcified atherosclerosis at the skull base.  Dural calcifications. Stable cerebral volume.  No ventriculomegaly. No midline shift, mass effect, or evidence of mass lesion.  Gray-white matter differentiation is within normal limits throughout the brain.  No evidence of cortically based acute infarction identified.  No acute intracranial hemorrhage identified.  No suspicious intracranial vascular hyperdensity.  IMPRESSION: Stable and negative  noncontrast CT appearance of the brain.   Original Report Authenticated By: Erskine Speed, M.D.     2000:  T/C to Neuro Dr. 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  Pt wants to go home now.  Dx and testing, as well as d/w Neuro MD, d/w pt and family.  Questions answered.  Verb understanding, agreeable to d/c home with outpt f/u.   Laray Anger, DO 07/02/12 2055

## 2012-07-02 NOTE — Progress Notes (Signed)
HPI:  [name] returns today for a scheduled followup visit.  [name] returns today for 6 month followup. He has been followed by Dr. 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 That was treated with radiation. He's had no evidence recurrent disease. He's been followed every 6 months with CT scans.   He says that he had been doing about the same. He has chronic shortness of breath which is really unchanged. This morning when he woke up he was numb on the left side of his chest in the left side of his face. He did not notice any weakness or facial droop. But he had essentially no filling over the anterior left chest wall or the left side of his face. He says his face has improved but he still not over his chest wall.     Past Medical History  Diagnosis Date  . Adenopathy     RIGHT ADRENAL  . Anxiety   . Arthritis     KNEES/HANDS  . Hx of melanoma of skin   . A-fib   . CAD (coronary artery disease)   . Lung cancer     NON-SMALL-CELL       Current Outpatient Prescriptions  Medication Sig Dispense Refill  . clonazePAM (KLONOPIN) 1 MG tablet Take 1 mg by mouth 3 (three) times daily as needed.       . fish oil-omega-3 fatty acids 1000 MG capsule Take 1 g by mouth daily.       No current facility-administered medications for this visit.    Physical Exam BP 152/80  Pulse 47  Resp 20  Ht 5\' 11"  (1.803 m)  Wt 223 lb (101.152 kg)  BMI 31.12 kg/m2  SpO6 22% 67 year old male in no acute distress Neurologic alert and oriented x3. 5 out of 5 grip bilaterally, no focal motor deficit, cranial nerves intact Decreased sensation to touch over the left base and left chest, normal sensation left arm  Diagnostic Tests: CT of chest 07/02/2012 Clinical Data: Bilateral  lung cancer.  CT CHEST WITH CONTRAST  Technique: Multidetector CT imaging of the chest was performed  following the standard protocol during bolus administration of  intravenous contrast.  Contrast: 75mL OMNIPAQUE IOHEXOL 300 MG/ML SOLN  Comparison: 01/16/2012  Findings: Heart is normal size. Aorta is normal caliber. No  mediastinal, hilar, or axillary adenopathy. Postoperative changes  bilaterally. Calcified right hilar and mediastinal lymph nodes.  Calcifications in the coronary arteries again noted, unchanged.  Again noted is the wedge-shaped pleural-based density peripherally  in the left lung. This currently measures 5.8 x 2.9 cm compared to  6.2 x 3.1 cm previously. Stable ground-glass area in the right  lower lobe. No new or enlarging nodules. No pleural effusions.  Visualized thyroid and chest wall soft tissues unremarkable.  Imaging into the upper abdomen shows no acute findings. Surgical  clips in the right upper retroperitoneum, stable.  No acute or focal bony abnormality. Degenerative changes in the  thoracic spine.  IMPRESSION:  Stable postoperative changes bilaterally.  Stable pleural based wedge-shaped density in the left lung  peripherally.  Coronary artery disease.  Impression: 67 year old gentleman with multiple previous lung cancers. He has no evidence of recurrent disease dating back to 2009 he was most recently treated. He is now  5 years now and I think we can go to annual CT scans at this point.  More concerning in the present as the left-sided facial and chest numbness. He doesn't have any motor deficits, but I can't be sure he has not had some type of neurologic event. I strongly recommended that he go to the emergency room evaluated for that. He initially was reluctant but now is agreeable.  Plan: ER notify the patient coming.  I will see him back in one year with a CT of the chest

## 2012-07-02 NOTE — ED Notes (Signed)
Pt awoke with L sided chest numbness this am.  He went for his 6 months post lung ca and described his s/s to the md who told him to come to ED.  Hx of intermittent afib.

## 2012-07-24 DIAGNOSIS — D235 Other benign neoplasm of skin of trunk: Secondary | ICD-10-CM | POA: Diagnosis not present

## 2012-07-24 DIAGNOSIS — Z85820 Personal history of malignant melanoma of skin: Secondary | ICD-10-CM | POA: Diagnosis not present

## 2012-09-02 DIAGNOSIS — F411 Generalized anxiety disorder: Secondary | ICD-10-CM | POA: Diagnosis not present

## 2012-09-12 DIAGNOSIS — Z125 Encounter for screening for malignant neoplasm of prostate: Secondary | ICD-10-CM | POA: Diagnosis not present

## 2012-09-12 DIAGNOSIS — R361 Hematospermia: Secondary | ICD-10-CM | POA: Diagnosis not present

## 2012-10-11 DIAGNOSIS — E7800 Pure hypercholesterolemia, unspecified: Secondary | ICD-10-CM | POA: Diagnosis not present

## 2012-10-11 DIAGNOSIS — R42 Dizziness and giddiness: Secondary | ICD-10-CM | POA: Diagnosis not present

## 2012-10-11 DIAGNOSIS — Z79899 Other long term (current) drug therapy: Secondary | ICD-10-CM | POA: Diagnosis not present

## 2012-10-11 DIAGNOSIS — Z733 Stress, not elsewhere classified: Secondary | ICD-10-CM | POA: Diagnosis not present

## 2012-10-16 ENCOUNTER — Emergency Department (HOSPITAL_COMMUNITY): Payer: Medicare Other

## 2012-10-16 ENCOUNTER — Encounter (HOSPITAL_COMMUNITY): Payer: Self-pay | Admitting: Physical Medicine and Rehabilitation

## 2012-10-16 ENCOUNTER — Inpatient Hospital Stay (HOSPITAL_COMMUNITY)
Admission: EM | Admit: 2012-10-16 | Discharge: 2012-10-18 | DRG: 247 | Disposition: A | Discharge status: Home / Self Care | Payer: Medicare Other | Attending: Internal Medicine | Admitting: Internal Medicine

## 2012-10-16 DIAGNOSIS — I252 Old myocardial infarction: Secondary | ICD-10-CM | POA: Diagnosis present

## 2012-10-16 DIAGNOSIS — Z923 Personal history of irradiation: Secondary | ICD-10-CM

## 2012-10-16 DIAGNOSIS — I2582 Chronic total occlusion of coronary artery: Secondary | ICD-10-CM | POA: Diagnosis present

## 2012-10-16 DIAGNOSIS — Z7902 Long term (current) use of antithrombotics/antiplatelets: Secondary | ICD-10-CM | POA: Diagnosis not present

## 2012-10-16 DIAGNOSIS — Z902 Acquired absence of lung [part of]: Secondary | ICD-10-CM

## 2012-10-16 DIAGNOSIS — R079 Chest pain, unspecified: Secondary | ICD-10-CM | POA: Diagnosis not present

## 2012-10-16 DIAGNOSIS — D696 Thrombocytopenia, unspecified: Secondary | ICD-10-CM | POA: Diagnosis not present

## 2012-10-16 DIAGNOSIS — I214 Non-ST elevation (NSTEMI) myocardial infarction: Secondary | ICD-10-CM | POA: Diagnosis not present

## 2012-10-16 DIAGNOSIS — Z85820 Personal history of malignant melanoma of skin: Secondary | ICD-10-CM | POA: Diagnosis not present

## 2012-10-16 DIAGNOSIS — Z955 Presence of coronary angioplasty implant and graft: Secondary | ICD-10-CM

## 2012-10-16 DIAGNOSIS — Z87891 Personal history of nicotine dependence: Secondary | ICD-10-CM | POA: Diagnosis not present

## 2012-10-16 DIAGNOSIS — E785 Hyperlipidemia, unspecified: Secondary | ICD-10-CM | POA: Diagnosis present

## 2012-10-16 DIAGNOSIS — I441 Atrioventricular block, second degree: Secondary | ICD-10-CM | POA: Diagnosis not present

## 2012-10-16 DIAGNOSIS — M19049 Primary osteoarthritis, unspecified hand: Secondary | ICD-10-CM | POA: Diagnosis present

## 2012-10-16 DIAGNOSIS — M1710 Unilateral primary osteoarthritis, unspecified knee: Secondary | ICD-10-CM | POA: Diagnosis present

## 2012-10-16 DIAGNOSIS — I498 Other specified cardiac arrhythmias: Secondary | ICD-10-CM | POA: Diagnosis present

## 2012-10-16 DIAGNOSIS — N400 Enlarged prostate without lower urinary tract symptoms: Secondary | ICD-10-CM | POA: Diagnosis not present

## 2012-10-16 DIAGNOSIS — Z8249 Family history of ischemic heart disease and other diseases of the circulatory system: Secondary | ICD-10-CM | POA: Diagnosis not present

## 2012-10-16 DIAGNOSIS — Z85118 Personal history of other malignant neoplasm of bronchus and lung: Secondary | ICD-10-CM

## 2012-10-16 DIAGNOSIS — I4891 Unspecified atrial fibrillation: Secondary | ICD-10-CM | POA: Diagnosis present

## 2012-10-16 DIAGNOSIS — J984 Other disorders of lung: Secondary | ICD-10-CM | POA: Diagnosis not present

## 2012-10-16 DIAGNOSIS — J438 Other emphysema: Secondary | ICD-10-CM | POA: Diagnosis not present

## 2012-10-16 DIAGNOSIS — Z801 Family history of malignant neoplasm of trachea, bronchus and lung: Secondary | ICD-10-CM | POA: Diagnosis not present

## 2012-10-16 DIAGNOSIS — Z7982 Long term (current) use of aspirin: Secondary | ICD-10-CM

## 2012-10-16 DIAGNOSIS — F411 Generalized anxiety disorder: Secondary | ICD-10-CM | POA: Diagnosis present

## 2012-10-16 DIAGNOSIS — I2510 Atherosclerotic heart disease of native coronary artery without angina pectoris: Secondary | ICD-10-CM

## 2012-10-16 DIAGNOSIS — Z79899 Other long term (current) drug therapy: Secondary | ICD-10-CM | POA: Diagnosis not present

## 2012-10-16 DIAGNOSIS — R072 Precordial pain: Secondary | ICD-10-CM | POA: Diagnosis not present

## 2012-10-16 DIAGNOSIS — I4821 Permanent atrial fibrillation: Secondary | ICD-10-CM | POA: Diagnosis present

## 2012-10-16 DIAGNOSIS — F419 Anxiety disorder, unspecified: Secondary | ICD-10-CM

## 2012-10-16 HISTORY — DX: Paroxysmal atrial fibrillation: I48.0

## 2012-10-16 HISTORY — DX: Malignant neoplasm of unspecified part of unspecified bronchus or lung: C34.90

## 2012-10-16 LAB — CBC WITH DIFFERENTIAL/PLATELET
Basophils Absolute: 0 10*3/uL (ref 0.0–0.1)
Basophils Relative: 0 % (ref 0–1)
HCT: 43.6 % (ref 39.0–52.0)
MCHC: 34.4 g/dL (ref 30.0–36.0)
Monocytes Absolute: 0.4 10*3/uL (ref 0.1–1.0)
Neutro Abs: 7.8 10*3/uL — ABNORMAL HIGH (ref 1.7–7.7)
Platelets: 124 10*3/uL — ABNORMAL LOW (ref 150–400)
RDW: 13.2 % (ref 11.5–15.5)

## 2012-10-16 LAB — TROPONIN I
Troponin I: 0.33 ng/mL (ref <0.30)
Troponin I: 0.48 ng/mL (ref <0.30)
Troponin I: 6.1 ng/mL (ref <0.30)

## 2012-10-16 LAB — BASIC METABOLIC PANEL
Calcium: 8.9 mg/dL (ref 8.4–10.5)
Chloride: 103 mEq/L (ref 96–112)
Creatinine, Ser: 0.96 mg/dL (ref 0.50–1.35)
GFR calc Af Amer: >90 mL/min (ref >90)

## 2012-10-16 LAB — PROTIME-INR: INR: 1.08 (ref 0.00–1.49)

## 2012-10-16 LAB — APTT: aPTT: 101 seconds — ABNORMAL HIGH (ref 24–37)

## 2012-10-16 MED ORDER — NITROGLYCERIN 0.4 MG SL SUBL: 0.4000 mg | SUBLINGUAL_TABLET | SUBLINGUAL | Status: DC | PRN

## 2012-10-16 MED ORDER — ACETAMINOPHEN 325 MG PO TABS: 650.0000 mg | ORAL_TABLET | ORAL | Status: DC | PRN

## 2012-10-16 MED ORDER — ASPIRIN EC 81 MG PO TBEC
81.0000 mg | DELAYED_RELEASE_TABLET | Freq: Every day | ORAL | Status: DC
  Administered 2012-10-17 – 2012-10-18 (×2): 81 mg via ORAL
  Filled 2012-10-16 (×2): qty 1

## 2012-10-16 MED ORDER — PANTOPRAZOLE SODIUM 40 MG PO TBEC
40.0000 mg | DELAYED_RELEASE_TABLET | Freq: Every day | ORAL | Status: DC
  Administered 2012-10-17 – 2012-10-18 (×2): 40 mg via ORAL
  Filled 2012-10-16 (×2): qty 1

## 2012-10-16 MED ORDER — FENTANYL CITRATE 0.05 MG/ML IJ SOLN
50.0000 ug | Freq: Once | INTRAMUSCULAR | Status: AC
  Administered 2012-10-16: 50 ug via INTRAVENOUS
  Filled 2012-10-16: qty 2

## 2012-10-16 MED ORDER — SODIUM CHLORIDE 0.9 % IV SOLN
1.0000 mL/kg/h | INTRAVENOUS | Status: DC
  Administered 2012-10-17: 1 mL/kg/h via INTRAVENOUS

## 2012-10-16 MED ORDER — ALPRAZOLAM 0.25 MG PO TABS: 0.2500 mg | ORAL_TABLET | Freq: Two times a day (BID) | ORAL | Status: DC | PRN

## 2012-10-16 MED ORDER — SODIUM CHLORIDE 0.9 % IV BOLUS (SEPSIS)
500.0000 mL | Freq: Once | INTRAVENOUS | Status: AC
  Administered 2012-10-16: 500 mL via INTRAVENOUS

## 2012-10-16 MED ORDER — CLONAZEPAM 0.5 MG PO TABS
1.0000 mg | ORAL_TABLET | Freq: Three times a day (TID) | ORAL | Status: DC
  Administered 2012-10-16 – 2012-10-18 (×6): 1 mg via ORAL
  Filled 2012-10-16: qty 1
  Filled 2012-10-16 (×2): qty 2
  Filled 2012-10-16 (×2): qty 1
  Filled 2012-10-16: qty 2

## 2012-10-16 MED ORDER — NITROGLYCERIN 0.4 MG SL SUBL
0.4000 mg | SUBLINGUAL_TABLET | SUBLINGUAL | Status: DC | PRN
  Administered 2012-10-16: 0.4 mg via SUBLINGUAL

## 2012-10-16 MED ORDER — SODIUM CHLORIDE 0.9 % IV SOLN: 250.0000 mL | INTRAVENOUS | Status: DC | PRN

## 2012-10-16 MED ORDER — HEPARIN (PORCINE) IN NACL 100-0.45 UNIT/ML-% IJ SOLN
1300.0000 [IU]/h | INTRAMUSCULAR | Status: DC
  Administered 2012-10-16: 1300 [IU]/h via INTRAVENOUS
  Filled 2012-10-16: qty 250

## 2012-10-16 MED ORDER — ASPIRIN 81 MG PO CHEW
324.0000 mg | CHEWABLE_TABLET | ORAL | Status: AC
  Administered 2012-10-17: 324 mg via ORAL
  Filled 2012-10-16: qty 4

## 2012-10-16 MED ORDER — ONDANSETRON HCL 4 MG/2ML IJ SOLN: 4.0000 mg | Freq: Four times a day (QID) | INTRAMUSCULAR | Status: DC | PRN

## 2012-10-16 MED ORDER — SODIUM CHLORIDE 0.9 % IJ SOLN
3.0000 mL | Freq: Two times a day (BID) | INTRAMUSCULAR | Status: DC
  Administered 2012-10-17: 3 mL via INTRAVENOUS

## 2012-10-16 MED ORDER — SODIUM CHLORIDE 0.9 % IV SOLN: INTRAVENOUS | Status: DC

## 2012-10-16 MED ORDER — HYDROMORPHONE HCL PF 1 MG/ML IJ SOLN
0.5000 mg | Freq: Once | INTRAMUSCULAR | Status: AC
  Administered 2012-10-16: 0.5 mg via INTRAVENOUS
  Filled 2012-10-16: qty 1

## 2012-10-16 MED ORDER — ZOLPIDEM TARTRATE 5 MG PO TABS: 5.0000 mg | ORAL_TABLET | Freq: Every evening | ORAL | Status: DC | PRN

## 2012-10-16 MED ORDER — SODIUM CHLORIDE 0.9 % IJ SOLN: 3.0000 mL | INTRAMUSCULAR | Status: DC | PRN

## 2012-10-16 MED ORDER — IOHEXOL 350 MG/ML SOLN
100.0000 mL | Freq: Once | INTRAVENOUS | Status: AC | PRN
  Administered 2012-10-16: 100 mL via INTRAVENOUS

## 2012-10-16 MED ORDER — CLONAZEPAM 0.5 MG PO TABS
0.5000 mg | ORAL_TABLET | Freq: Every day | ORAL | Status: AC | PRN
  Administered 2012-10-17: 0.5 mg via ORAL
  Filled 2012-10-16: qty 1

## 2012-10-16 MED ORDER — HEPARIN (PORCINE) IN NACL 100-0.45 UNIT/ML-% IJ SOLN
1500.0000 [IU]/h | INTRAMUSCULAR | Status: DC
  Filled 2012-10-16 (×2): qty 250

## 2012-10-16 MED ORDER — NITROGLYCERIN IN D5W 200-5 MCG/ML-% IV SOLN
10.0000 ug/min | INTRAVENOUS | Status: DC
  Administered 2012-10-16: 10 ug/min via INTRAVENOUS
  Filled 2012-10-16: qty 250

## 2012-10-16 MED ORDER — HEPARIN BOLUS VIA INFUSION
4000.0000 [IU] | Freq: Once | INTRAVENOUS | Status: AC
  Administered 2012-10-16: 4000 [IU] via INTRAVENOUS

## 2012-10-16 MED ORDER — ATORVASTATIN CALCIUM 80 MG PO TABS
80.0000 mg | ORAL_TABLET | Freq: Every day | ORAL | Status: DC
  Administered 2012-10-16: 80 mg via ORAL
  Filled 2012-10-16 (×3): qty 1

## 2012-10-16 NOTE — Progress Notes (Signed)
Utilization review completed.  P.J. Vivica Dobosz,RN,BSN Case Manager [phone]  

## 2012-10-16 NOTE — ED Notes (Signed)
Pt presents to department via EMS from home, woke up @ 04:00 with pain between shoulders, pain radiates to midsternal chest. Received 2 sublingual nitroglycerin and 325 ASA. Describes pain as constant "squeezing" sensation. 2/10 pain upon arrival. Sinus bradycardia on monitor. Pt is conscious alert and oriented x4. 18g LAC.

## 2012-10-16 NOTE — ED Notes (Signed)
Report to Chris, RN.

## 2012-10-16 NOTE — ED Provider Notes (Signed)
CSN: [id number]     Arrival date & time 10/16/12  0804 History     First MD Initiated Contact with Patient 10/16/12 0809     Chief Complaint  Patient presents with  . Chest Pain   (Consider location/radiation/quality/duration/timing/severity/associated sxs/prior Treatment) HPI Comments: 67 yo male with CAD, lung CA, a fib,  Past smoker, has seen [facility] cardiology in the past presents with chest and back pain that woke him up at 4 am constant.  Pt has had intermittent cp with lung CA however this was squeezing sensation and severe pain between the shoulder blades.  Nitro on route did not help.  ASA given.  No known aneurysm hx.  Nothing improves.  Mild nausea.  No recent exertional sxs or recent stress tests.  No blood clot hx.  Pt considered cancer free, no current chemo or radiation. No injuries.    Patient is a 67 y.o. male presenting with chest pain. The history is provided by the patient.  Chest Pain Associated symptoms: back pain   Associated symptoms: no abdominal pain, no fever, no headache, no shortness of breath, not vomiting and no weakness     Past Medical History  Diagnosis Date  . Adenopathy     RIGHT ADRENAL  . Anxiety   . Arthritis     KNEES/HANDS  . Hx of melanoma of skin   . A-fib   . Lung cancer     NON-SMALL-CELL   . CAD (coronary artery disease)    Past Surgical History  Procedure Laterality Date  . Lobectomy left upper lobe  08/15/2001  . Adrenalextomy  02/2000  . Right vats, mini thoracotomy, wedge resection of right upper  01/18/2004   History reviewed. No pertinent family history. History  Substance Use Topics  . Smoking status: Former Smoker    Quit date: 08/15/2001  . Smokeless tobacco: Not on file  . Alcohol Use: No    Review of Systems  Constitutional: Negative for fever and chills.  HENT: Negative for neck pain and neck stiffness.   Eyes: Negative for visual disturbance.  Respiratory: Negative for shortness of breath.   Cardiovascular:  Positive for chest pain.  Gastrointestinal: Negative for vomiting and abdominal pain.  Genitourinary: Negative for dysuria and flank pain.  Musculoskeletal: Positive for back pain.  Skin: Negative for rash.  Neurological: Positive for light-headedness. Negative for weakness and headaches.    Allergies  Codeine  Home Medications   Current Outpatient Rx  Name  Route  Sig  Dispense  Refill  . clonazePAM (KLONOPIN) 1 MG tablet   Oral   Take 1 mg by mouth 3 (three) times daily.           BP 155/84  Pulse 58  Temp(Src) 98.6 F (37 C) (Oral)  Resp 17  SpO2 97% Physical Exam  Nursing note and vitals reviewed. Constitutional: He is oriented to person, place, and time. He appears well-developed and well-nourished.  HENT:  Head: Normocephalic and atraumatic.  Eyes: Conjunctivae are normal. Right eye exhibits no discharge. Left eye exhibits no discharge.  Neck: Normal range of motion. Neck supple. No tracheal deviation present.  Cardiovascular: Normal rate, regular rhythm and intact distal pulses.   No murmur heard. Pulmonary/Chest: Effort normal and breath sounds normal.  Abdominal: Soft. He exhibits no distension. There is no tenderness. There is no guarding.  Musculoskeletal: He exhibits tenderness (mild paraspinal thoracic). He exhibits no edema.  Neurological: He is alert and oriented to person, place, and time.  Skin: Skin is warm. No rash noted.  Psychiatric: He has a normal mood and affect.    ED Course   Procedures (including critical care time) CRITICAL CARE Performed by: Enid Skeens   Total critical care time: 40 min  Critical care time was exclusive of separately billable procedures and treating other patients.  Critical care was necessary to treat or prevent imminent or life-threatening deterioration.  Critical care was time spent personally by me on the following activities: development of treatment plan with patient and/or surrogate as well as  nursing, discussions with consultants, evaluation of patient's response to treatment, examination of patient, obtaining history from patient or surrogate, ordering and performing treatments and interventions, ordering and review of laboratory studies, ordering and review of radiographic studies, pulse oximetry and re-evaluation of patient's condition.   Labs Reviewed  CBC WITH DIFFERENTIAL - Abnormal; Notable for the following:    Platelets 124 (*)    Neutrophils Relative % 84 (*)    Neutro Abs 7.8 (*)    Lymphocytes Relative 11 (*)    All other components within normal limits  BASIC METABOLIC PANEL - Abnormal; Notable for the following:    Glucose, Bld 123 (*)    GFR calc non Af Amer 83 (*)    All other components within normal limits  TROPONIN I - Abnormal; Notable for the following:    Troponin I 0.33 (*)    All other components within normal limits   Dg Chest 2 View  10/16/2012   *RADIOLOGY REPORT*  Clinical Data: Previous history of lung cancer with left shoulder pain  CHEST - 2 VIEW  Comparison: CT scan 07/02/2012  Findings: Laterally in the left upper lobe there is a pleural-based 7 x 4.5 cm mass like opacity, which was present on the prior CT scan.  There is associated left hilar retraction and postsurgical change.  The heart size and vascular pattern are normal.  The right lung is clear.  There is mild compensatory hyperinflation of the right lung with mild shift of the mediastinal contents toward the left.  IMPRESSION: No acute findings.  Stable pleural based mass like density in the left upper lobe peripherally.   Original Report Authenticated By: Esperanza Heir, M.D.   No diagnosis found.  MDM  Concern for CAD vs dissection vs other. EKG no acute findings.  Pain medicines had mild effect on cp.  CT dissection ordered.  ASA PTA.  Pt has hx of bradycardia in the 40s, has seen cardiology [facility].  CP worsened, repeat EKG showed mild ST depr with a fib for which he has hx of.  Spoke  with cardiology team, they will see pt in ED.  Holding on heparin until CT dissection done. Recheck, repeat pain meds.  Nitro drip started, improved after starting.  Date: 10/16/2012  Rate: 38  Rhythm: sinus bradycardia  QRS Axis: normal  Intervals: PR prolonged  ST/T Wave abnormalities: nonspecific ST changes  Conduction Disutrbances:first-degree A-V block   Narrative Interpretation:   Old EKG Reviewed: unchanged  Repeat with worsening pain.  Date: 10/16/2012  Rate: a fib hr 47  Rhythm: atrial fibrillation  QRS Axis: normal  Intervals: a fib  ST/T Wave abnormalities: ST depressions inferiorly  Conduction Disutrbances:a fib  Narrative Interpretation:   Old EKG Reviewed: changes noted Dg Chest 2 View  10/16/2012   *RADIOLOGY REPORT*  Clinical Data: Previous history of lung cancer with left shoulder pain  CHEST - 2 VIEW  Comparison: CT scan 07/02/2012  Findings: Laterally in the left upper lobe there is a pleural-based 7 x 4.5 cm mass like opacity, which was present on the prior CT scan.  There is associated left hilar retraction and postsurgical change.  The heart size and vascular pattern are normal.  The right lung is clear.  There is mild compensatory hyperinflation of the right lung with mild shift of the mediastinal contents toward the left.  IMPRESSION: No acute findings.  Stable pleural based mass like density in the left upper lobe peripherally.   Original Report Authenticated By: Esperanza Heir, M.D.   Ct Angio Chest Aortic Dissect W &/or W/o  10/16/2012   *RADIOLOGY REPORT*  Clinical Data:  Severe chest pain, history of lung cancer  CT ANGIOGRAPHY CHEST, ABDOMEN AND PELVIS  Technique:  Multidetector CT imaging through the chest, abdomen and pelvis was performed using the standard protocol during bolus administration of intravenous contrast.  Multiplanar reconstructed images including MIPs were obtained and reviewed to evaluate the vascular anatomy.  Contrast: OMNIPAQUE  IOHEXOL 350 MG/ML SOLN  Comparison:  CT chest dated 07/02/2012.  PET CT dated 06/28/2009.  CTA CHEST  Findings:  No evidence of intramural hematoma.  No evidence of thoracic aortic aneurysm or dissection.  Although not optimized for evaluation of the pulmonary arteries, there is no evidence of pulmonary embolism.  Prior left upper lobectomy.  Postsurgical change postsurgical changes with pleural thickening along the posterior left upper hemithorax. Additional postsurgical changes related to prior right upper lung wedge resection.  No suspicious pulmonary nodules.  Mild centrilobular emphysematous changes. No pleural effusion or pneumothorax.  Visualized thyroid is unremarkable.  The heart is top normal in size.  Coronary atherosclerosis.  No suspicious mediastinal, hilar, or axillary lymphadenopathy.  Mild degenerative changes of the thoracic spine.   Review of the MIP images confirms the above findings.  IMPRESSION: No evidence of thoracic aortic aneurysm or dissection.  Postsurgical changes related to prior left upper lobectomy and right upper lobe wedge resection.  Stable pleural based opacity in the posterior left upper lung, likely postsurgical.  No evidence of recurrent or metastatic disease in the chest.  CTA ABDOMEN AND PELVIS  Findings:  No evidence of abdominal aortic aneurysm or dissection. 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  Liver, spleen, pancreas, and left adrenal gland are within normal limits.  Prior right adrenalectomy.  Gallbladder is unremarkable.  No intrahepatic or extrahepatic ductal dilatation.  Kidneys are within normal limits.  No hydronephrosis.  No evidence of bowel obstruction.  Normal appendix.  No abdominopelvic ascites.  No suspicious abdominopelvic lymphadenopathy.  Mild prostatomegaly, measuring 5.0 cm in transverse dimension.  Ureteral or bladder calculi.  Bladder is within normal limits.  Grade II  spondylolisthesis of L4 on L5.  Mild to moderate degenerative changes of the lumbar spine. Scattered benign bone islands.   Review of the MIP images confirms the above findings.  IMPRESSION: No evidence of abdominal aortic aneurysm or dissection.  No evidence of metastatic disease in the abdomen/pelvis.   Original Report Authenticated By: Charline Bills, M.D.   Ct Angio Abd/pel W/ And/or W/o  10/16/2012   *RADIOLOGY REPORT*  Clinical Data:  Severe chest pain, history of lung cancer  CT ANGIOGRAPHY CHEST, ABDOMEN AND PELVIS  Technique:  Multidetector CT imaging through the chest, abdomen and pelvis was performed using the standard protocol during bolus administration of intravenous contrast.  Multiplanar reconstructed images including MIPs were obtained and reviewed to evaluate the  vascular anatomy.  Contrast: OMNIPAQUE IOHEXOL 350 MG/ML SOLN  Comparison:  CT chest dated 07/02/2012.  PET CT dated 06/28/2009.  CTA CHEST  Findings:  No evidence of intramural hematoma.  No evidence of thoracic aortic aneurysm or dissection.  Although not optimized for evaluation of the pulmonary arteries, there is no evidence of pulmonary embolism.  Prior left upper lobectomy.  Postsurgical change postsurgical changes with pleural thickening along the posterior left upper hemithorax. Additional postsurgical changes related to prior right upper lung wedge resection.  No suspicious pulmonary nodules.  Mild centrilobular emphysematous changes. No pleural effusion or pneumothorax.  Visualized thyroid is unremarkable.  The heart is top normal in size.  Coronary atherosclerosis.  No suspicious mediastinal, hilar, or axillary lymphadenopathy.  Mild degenerative changes of the thoracic spine.   Review of the MIP images confirms the above findings.  IMPRESSION: No evidence of thoracic aortic aneurysm or dissection.  Postsurgical changes related to prior left upper lobectomy and right upper lobe wedge resection.  Stable pleural based  opacity in the posterior left upper lung, likely postsurgical.  No evidence of recurrent or metastatic disease in the chest.  CTA ABDOMEN AND PELVIS  Findings:  No evidence of abdominal aortic aneurysm or dissection. 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  Liver, spleen, pancreas, and left adrenal gland are within normal limits.  Prior right adrenalectomy.  Gallbladder is unremarkable.  No intrahepatic or extrahepatic ductal dilatation.  Kidneys are within normal limits.  No hydronephrosis.  No evidence of bowel obstruction.  Normal appendix.  No abdominopelvic ascites.  No suspicious abdominopelvic lymphadenopathy.  Mild prostatomegaly, measuring 5.0 cm in transverse dimension.  Ureteral or bladder calculi.  Bladder is within normal limits.  Grade II spondylolisthesis of L4 on L5.  Mild to moderate degenerative changes of the lumbar spine. Scattered benign bone islands.   Review of the MIP images confirms the above findings.  IMPRESSION: No evidence of abdominal aortic aneurysm or dissection.  No evidence of metastatic disease in the abdomen/pelvis.   Original Report Authenticated By: Charline Bills, M.D.    Pain improved on recheck>  Cardiologist evaluated. Heparin drip started.  Admission for cath/ close monitoring.    Enid Skeens, MD 10/16/12 1256

## 2012-10-16 NOTE — Progress Notes (Signed)
Anticoagulation Consult for Heparin Indication: NSTEMI  Heparin level = 0.3 on 1300 units/hr Goal heparin level = 0.3-0.7  The heparin level is in the desired range but at the very bottom. Will increase the rate to 1400 units/hr to ensure the level stays therapeutic. Will recheck level with AM labs.  Cardell Peach, PharmD

## 2012-10-16 NOTE — H&P (Signed)
Primary MD: Ginette Otto, MD Cardiologist: Nahser   Chief complaint: Chest pain HPI:  Benjamin [name] is a 67 y.o. male with a history of CAD, AF, lung CA x 2  s/p resection and XRT.   Cath in 2001 with chronic T Lcx. F/u cath 2008. Had recanalization of LCx. Otherwise non-obs disease. LAD 40, D1 OK, D2 50, CFX 90, RCA OK, EF 60%; Consider PCI PRN but the stenosis is quite complex and we would end up jailing several large posterolateral branches    He has a history of chronic stable angina that has not changed recently. He also has chronic dyspnea on exertion, no recent change. He has had some resting chest pain that resolved in just a few minutes and was not severe.  He woke up this morning at 4 AM to use the restroom. He noted substernal chest pain that radiated down his left arm, into his left shoulder into the left scapular area. It was like his usual angina but was more severe. It was a 9/10. It was a pressure or squeezing sensation. It was associated with shortness of breath and diaphoresis but no nausea or vomiting.   When his symptoms did not resolve, he took sublingual nitroglycerin at home. This did not work. EMS was called and they gave him sublingual nitroglycerin plus aspirin 81 mg x4. His pain began to decrease slightly in the emergency room after more nitroglycerin. It became worse after he went to x-ray and he was started on IV nitroglycerin. He also received fentanyl and Dilaudid. The shortness of breath and diaphoresis have resolved. Currently his chest pain resolved. Minimal residual scapular pain.   Review of Systems:    Cardiac Review of Systems: {Y] = yes [ ]  = no  Chest Pain [ y   ]  Resting SOB [  y ] Exertional SOB  [ y ]  Orthopnea [  ]   Pedal Edema [ y - chronic ]    Palpitations [  ] Syncope  [  ]   Presyncope [   ]  General Review of Systems: [Y] = yes [  ]=no Constitional: recent weight change [  ]; anorexia [  ]; fatigue [  ]; nausea [  ]; night sweats  [  ]; fever [  ]; or chills [  ];                                                                                                                                          Dental: poor dentition[  ];    Eye : blurred vision [  ]; diplopia [   ]; vision changes [  ];  Amaurosis fugax[  ]; Resp: cough [  ];  wheezing[  ];  hemoptysis[  ]; shortness of breath[  ]; paroxysmal nocturnal dyspnea[  ]; dyspnea on exertion[ y ]; or orthopnea[  ];  GI:  gallstones[  ], vomiting[  ];  dysphagia[  ]; melena[  ];  hematochezia [  ]; heartburn[  ];    GU: kidney stones [  ]; hematuria[  ];   dysuria [  ];  nocturia[  ];  history of     obstruction [  ];                 Skin: rash, swelling[  ];, hair loss[  ];  peripheral edema[  ];  or itching[  ]; Musculosketetal: myalgias[  ];  joint swelling[  ];  joint erythema[  ];  joint pain[  ];  back pain[  ];  Heme/Lymph: bruising[  ];  bleeding[  ];  anemia[  ];  Neuro: TIA[  ];  headaches[  ];  stroke[  ];  vertigo[  ];  seizures[  ];   paresthesias[  ];  difficulty walking[  ];  Psych:depression[  ]; anxiety[ y ];  Endocrine: diabetes[  ];  thyroid dysfunction[  ];  Immunizations: Flu [  ]; Pneumococcal[  ];  Other:  Past Medical History  Diagnosis Date  . Adenopathy     RIGHT ADRENAL  . Anxiety   . Arthritis     KNEES/HANDS  . Hx of melanoma of skin   . A-fib   . Lung cancer     NON-SMALL-CELL   . CAD (coronary artery disease)    Past Surgical History  Procedure Laterality Date  . Lobectomy left upper lobe  08/15/2001  . Adrenalextomy  02/2000  . Right vats, mini thoracotomy, wedge resection of right upper  01/18/2004  . Cardiac catheterization  2008    LAD 40, D1 OK, D2 50, CFX 90, RCA OK, EF 60%; Consider PCI PRN but the stenosis is quite complex and we would end up jailing several large posterolateral branches      Prior to Admission medications   Medication Sig Start Date End Date Taking? Authorizing Provider  clonazePAM (KLONOPIN) 1 MG  tablet Take 1 mg by mouth 3 (three) times daily.    Yes Historical Provider, MD    Allergies  Allergen Reactions  . Codeine Other (See Comments)    Dizziness, stomach pain    History   Social History  . Marital Status: Divorced    Spouse Name: N/A    Number of Children: N/A  . Years of Education: N/A   Occupational History  . Retired Therapist, music [employer]   Social History Main Topics  . Smoking status: Former Smoker -- 40 years    Quit date: 08/15/2001  . Smokeless tobacco: Not on file  . Alcohol Use: No  . Drug Use: No  . Sexually Active: Not on file   Other Topics Concern  . Not on file   Social History Narrative  . No narrative on file    Family History  Problem Relation Age of Onset  . Heart attack Father    Family Status  Relation Status Death Age  . Mother Deceased     Lung CA  . Father Deceased 11    MI   PHYSICAL EXAM: Filed Vitals:   10/16/12 1215  BP: 122/68  Pulse: 56  Temp:   Resp: 12   General:  Well appearing. No respiratory difficulty HEENT: normal  Neck: supple. no JVD. Carotids 2+ bilat; no bruits. No lymphadenopathy or thryomegaly appreciated. Cor: PMI nondisplaced. Irregular rate & rhythm. No rubs, gallops or murmurs. Lungs: Dry rales Abdomen: soft, nontender, nondistended.  No hepatosplenomegaly. No bruits or masses. Good bowel sounds. Extremities: no cyanosis, clubbing, rash, 1+ pedal edema Neuro: alert & oriented x 3, cranial nerves grossly intact. moves all 4 extremities w/o difficulty. Affect pleasant but a little anxious  ECG: Sinus brady, 38, possible ST depression approx. 1mm inferior leads. Repeat ECG atrial fib, rate 50s, complexes appear the same  Results for orders placed during the hospital encounter of 10/16/12 (from the past 24 hour(s))  CBC WITH DIFFERENTIAL     Status: Abnormal   Collection Time    10/16/12  9:30 AM      Result Value Range   WBC 9.3  4.0 - 10.5 K/uL   RBC 4.95  4.22 - 5.81 MIL/uL   Hemoglobin 15.0   13.0 - 17.0 g/dL   HCT 16.1  09.6 - 04.5 %   MCV 88.1  78.0 - 100.0 fL   MCH 30.3  26.0 - 34.0 pg   MCHC 34.4  30.0 - 36.0 g/dL   RDW 40.9  81.1 - 91.4 %   Platelets 124 (*) 150 - 400 K/uL   Neutrophils Relative % 84 (*) 43 - 77 %   Neutro Abs 7.8 (*) 1.7 - 7.7 K/uL   Lymphocytes Relative 11 (*) 12 - 46 %   Lymphs Abs 1.0  0.7 - 4.0 K/uL   Monocytes Relative 5  3 - 12 %   Monocytes Absolute 0.4  0.1 - 1.0 K/uL   Eosinophils Relative 0  0 - 5 %   Eosinophils Absolute 0.0  0.0 - 0.7 K/uL   Basophils Relative 0  0 - 1 %   Basophils Absolute 0.0  0.0 - 0.1 K/uL  BASIC METABOLIC PANEL     Status: Abnormal   Collection Time    10/16/12  9:30 AM      Result Value Range   Sodium 138  135 - 145 mEq/L   Potassium 3.9  3.5 - 5.1 mEq/L   Chloride 103  96 - 112 mEq/L   CO2 28  19 - 32 mEq/L   Glucose, Bld 123 (*) 70 - 99 mg/dL   BUN 12  6 - 23 mg/dL   Creatinine, Ser 7.82  0.50 - 1.35 mg/dL   Calcium 8.9  8.4 - 95.6 mg/dL   GFR calc non Af Amer 83 (*) >90 mL/min   GFR calc Af Amer >90  >90 mL/min  TROPONIN I     Status: Abnormal   Collection Time    10/16/12  9:32 AM      Result Value Range   Troponin I 0.33 (*) <0.30 ng/mL   Dg Chest 2 View 10/16/2012   *RADIOLOGY REPORT*  Clinical Data: Previous history of lung cancer with left shoulder pain  CHEST - 2 VIEW  Comparison: CT scan 07/02/2012  Findings: Laterally in the left upper lobe there is a pleural-based 7 x 4.5 cm mass like opacity, which was present on the prior CT scan.  There is associated left hilar retraction and postsurgical change.  The heart size and vascular pattern are normal.  The right lung is clear.  There is mild compensatory hyperinflation of the right lung with mild shift of the mediastinal contents toward the left.  IMPRESSION: No acute findings.  Stable pleural based mass like density in the left upper lobe peripherally.   Original Report Authenticated By: Esperanza Heir, M.D.   Ct Angio Abd/pel W/ And/or  W/o 10/16/2012   *RADIOLOGY REPORT*  Clinical Data:  Severe  chest pain, history of lung cancer  CT ANGIOGRAPHY CHEST, ABDOMEN AND PELVIS  Technique:  Multidetector CT imaging through the chest, abdomen and pelvis was performed using the standard protocol during bolus administration of intravenous contrast.  Multiplanar reconstructed images including MIPs were obtained and reviewed to evaluate the vascular anatomy.  Contrast: OMNIPAQUE IOHEXOL 350 MG/ML SOLN  Comparison:  CT chest dated 07/02/2012.  PET CT dated 06/28/2009.  CTA CHEST  Findings:  No evidence of intramural hematoma.  No evidence of thoracic aortic aneurysm or dissection.  Although not optimized for evaluation of the pulmonary arteries, there is no evidence of pulmonary embolism.  Prior left upper lobectomy.  Postsurgical change postsurgical changes with pleural thickening along the posterior left upper hemithorax. Additional postsurgical changes related to prior right upper lung wedge resection.  No suspicious pulmonary nodules.  Mild centrilobular emphysematous changes. No pleural effusion or pneumothorax.  Visualized thyroid is unremarkable.  The heart is top normal in size.  Coronary atherosclerosis.  No suspicious mediastinal, hilar, or axillary lymphadenopathy.  Mild degenerative changes of the thoracic spine.   Review of the MIP images confirms the above findings.  IMPRESSION: No evidence of thoracic aortic aneurysm or dissection.  Postsurgical changes related to prior left upper lobectomy and right upper lobe wedge resection.  Stable pleural based opacity in the posterior left upper lung, likely postsurgical.  No evidence of recurrent or metastatic disease in the chest.    CTA ABDOMEN AND PELVIS  Findings:  No evidence of abdominal aortic aneurysm or dissection. 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  Liver, spleen, pancreas, and left adrenal gland are within normal  limits.  Prior right adrenalectomy.  Gallbladder is unremarkable.  No intrahepatic or extrahepatic ductal dilatation.  Kidneys are within normal limits.  No hydronephrosis.  No evidence of bowel obstruction.  Normal appendix.  No abdominopelvic ascites.  No suspicious abdominopelvic lymphadenopathy.  Mild prostatomegaly, measuring 5.0 cm in transverse dimension.  Ureteral or bladder calculi.  Bladder is within normal limits.  Grade II spondylolisthesis of L4 on L5.  Mild to moderate degenerative changes of the lumbar spine. Scattered benign bone islands.   Review of the MIP images confirms the above findings.  IMPRESSION: No evidence of abdominal aortic aneurysm or dissection.  No evidence of metastatic disease in the abdomen/pelvis.   Original Report Authenticated By: Charline Bills, M.D.   ASSESSMENT: 1.  Non-ST elevation myocardial infarction (NSTEMI), initial episode of care 2.  Anxiety 3.  A-fib, chronic 4. Thrombocytopenia 5. Anticoagulation 6. CAD per cath in 2001 and 2008 7. H/o lung CA s/p resection x 2  PLAN/DISCUSSION: 67 yo male with hx CAD and atrial fibrillation woke with chest pain this am.   Symptoms are his usual angina but more severe and his initial enzymes are elevated. Will admit stepdown, continue to cycle enzymes. Use IV NTG, heparin and prn Rx to manage symptoms. ECG no ST elevation. Manage medically and screen for CRF control. Plan cath in am. Continue home Klonopin.   CHADs2VASC is 3 so would be anticoag candidate. Heparin for now, Dr. Elease Hashimoto to discuss with patient.    Attending: Patient seen and examined with Theodore Demark, PA-C. We discussed all aspects of the encounter. I agree with the assessment and plan as stated above.   Patient with known CAD. Last cath 2008. Presentation c/w NSTEMI. Admit to SDU. Continue IV heparin, ASA, NTG. Add statin. Start b-block if HR can tolerate. Plan cath.  He has chronic AF and would likely benefit from anti-coagulation but  need to wait to see if he will require DAPT before making that decision.  Taelyr Jantz,MD 1:34 PM

## 2012-10-16 NOTE — ED Notes (Signed)
Patient transported to X-ray 

## 2012-10-16 NOTE — Progress Notes (Signed)
ANTICOAGULATION CONSULT NOTE - Initial Consult  Pharmacy Consult for heparin Indication: NSTEMI  Allergies  Allergen Reactions  . Codeine Other (See Comments)    Dizziness, stomach pain    Patient Measurements: Height: 5\' 11"  (180.3 cm) Weight: 217 lb (98.431 kg) IBW/kg (Calculated) : 75.3 Heparin Dosing Weight: 94 kg   Vital Signs: Temp: 98.6 F (37 C) (07/30 1033) Temp src: Oral (07/30 0822) BP: 122/68 mmHg (07/30 1215) Pulse Rate: 56 (07/30 1215)  Labs:  Recent Labs  10/16/12 0930 10/16/12 0932  HGB 15.0  --   HCT 43.6  --   PLT 124*  --   CREATININE 0.96  --   TROPONINI  --  0.33*    Estimated Creatinine Clearance: 86.8 ml/min (by C-G formula based on Cr of 0.96).   Medical History: Past Medical History  Diagnosis Date  . Adenopathy     RIGHT ADRENAL  . Anxiety   . Arthritis     KNEES/HANDS  . Hx of melanoma of skin   . A-fib   . Lung cancer     NON-SMALL-CELL   . CAD (coronary artery disease)    Assessment: 67 yo male with hx CAD and chronic stable angina presents with SOB, chest and back pain, more severe than usual angina. Heparin started for NSTEMI, Initial Trop 0.33, Plts 124, H/H wnl  Goal of Therapy:  Heparin level 0.3-0.7 units/ml Monitor platelets by anticoagulation protocol: Yes   Plan:  Give 4000 units bolus x 1 Start heparin infusion at 1300 units/hr Check anti-Xa level in 6 hours and daily while on heparin Continue to monitor H&H and platelets  Helon Wisinski B. Artelia Laroche, PharmD Clinical Pharmacist - Resident Pager: [phone] Phone: [phone] 10/16/2012 1:03 PM

## 2012-10-17 ENCOUNTER — Encounter (HOSPITAL_COMMUNITY): Payer: Self-pay

## 2012-10-17 ENCOUNTER — Encounter (HOSPITAL_COMMUNITY)
Admission: EM | Disposition: A | Discharge status: Home / Self Care | Payer: Self-pay | Source: Home / Self Care | Attending: Internal Medicine

## 2012-10-17 DIAGNOSIS — I2582 Chronic total occlusion of coronary artery: Secondary | ICD-10-CM | POA: Diagnosis not present

## 2012-10-17 DIAGNOSIS — I4891 Unspecified atrial fibrillation: Secondary | ICD-10-CM | POA: Diagnosis not present

## 2012-10-17 DIAGNOSIS — I441 Atrioventricular block, second degree: Secondary | ICD-10-CM | POA: Diagnosis present

## 2012-10-17 DIAGNOSIS — D696 Thrombocytopenia, unspecified: Secondary | ICD-10-CM | POA: Diagnosis not present

## 2012-10-17 DIAGNOSIS — I214 Non-ST elevation (NSTEMI) myocardial infarction: Secondary | ICD-10-CM

## 2012-10-17 DIAGNOSIS — I2510 Atherosclerotic heart disease of native coronary artery without angina pectoris: Secondary | ICD-10-CM | POA: Diagnosis not present

## 2012-10-17 HISTORY — PX: LEFT HEART CATHETERIZATION WITH CORONARY ANGIOGRAM: SHX5451

## 2012-10-17 LAB — LIPID PANEL
Cholesterol: 165 mg/dL (ref 0–200)
HDL: 40 mg/dL (ref >39)
Total CHOL/HDL Ratio: 4.1 RATIO
Triglycerides: 80 mg/dL (ref <150)
VLDL: 16 mg/dL (ref 0–40)

## 2012-10-17 LAB — COMPREHENSIVE METABOLIC PANEL
BUN: 13 mg/dL (ref 6–23)
CO2: 28 mEq/L (ref 19–32)
Chloride: 103 mEq/L (ref 96–112)
Creatinine, Ser: 1.01 mg/dL (ref 0.50–1.35)
GFR calc Af Amer: 86 mL/min — ABNORMAL LOW (ref >90)
GFR calc non Af Amer: 74 mL/min — ABNORMAL LOW (ref >90)
Glucose, Bld: 104 mg/dL — ABNORMAL HIGH (ref 70–99)
Total Bilirubin: 0.5 mg/dL (ref 0.3–1.2)

## 2012-10-17 LAB — CBC
MCV: 88.5 fL (ref 78.0–100.0)
Platelets: 121 10*3/uL — ABNORMAL LOW (ref 150–400)
RDW: 13.3 % (ref 11.5–15.5)
WBC: 8.4 10*3/uL (ref 4.0–10.5)

## 2012-10-17 LAB — HEPARIN LEVEL (UNFRACTIONATED): Heparin Unfractionated: 0.29 IU/mL — ABNORMAL LOW (ref 0.30–0.70)

## 2012-10-17 LAB — TROPONIN I: Troponin I: 15.2 ng/mL (ref <0.30)

## 2012-10-17 SURGERY — LEFT HEART CATHETERIZATION WITH CORONARY ANGIOGRAM
Anesthesia: LOCAL

## 2012-10-17 MED ORDER — MIDAZOLAM HCL 2 MG/2ML IJ SOLN
INTRAMUSCULAR | Status: AC
  Filled 2012-10-17: qty 2

## 2012-10-17 MED ORDER — HEPARIN (PORCINE) IN NACL 2-0.9 UNIT/ML-% IJ SOLN
INTRAMUSCULAR | Status: AC
  Filled 2012-10-17: qty 1000

## 2012-10-17 MED ORDER — BIVALIRUDIN 250 MG IV SOLR
INTRAVENOUS | Status: AC
  Filled 2012-10-17: qty 250

## 2012-10-17 MED ORDER — SODIUM CHLORIDE 0.9 % IV SOLN
0.2500 mg/kg/h | INTRAVENOUS | Status: DC
  Filled 2012-10-17 (×2): qty 250

## 2012-10-17 MED ORDER — FENTANYL CITRATE 0.05 MG/ML IJ SOLN
INTRAMUSCULAR | Status: AC
  Filled 2012-10-17: qty 2

## 2012-10-17 MED ORDER — SODIUM CHLORIDE 0.9 % IV SOLN: INTRAVENOUS | Status: AC

## 2012-10-17 MED ORDER — PRASUGREL HCL 10 MG PO TABS
10.0000 mg | ORAL_TABLET | Freq: Every day | ORAL | Status: DC
  Administered 2012-10-18: 10:00:00 10 mg via ORAL
  Filled 2012-10-17: qty 1

## 2012-10-17 MED ORDER — HEPARIN SODIUM (PORCINE) 1000 UNIT/ML IJ SOLN
INTRAMUSCULAR | Status: AC
  Filled 2012-10-17: qty 1

## 2012-10-17 MED ORDER — LIDOCAINE HCL (PF) 1 % IJ SOLN
INTRAMUSCULAR | Status: AC
  Filled 2012-10-17: qty 30

## 2012-10-17 MED ORDER — PRASUGREL HCL 10 MG PO TABS
ORAL_TABLET | ORAL | Status: AC
  Filled 2012-10-17: qty 6

## 2012-10-17 MED ORDER — VERAPAMIL HCL 2.5 MG/ML IV SOLN
INTRAVENOUS | Status: AC
  Filled 2012-10-17: qty 2

## 2012-10-17 MED ORDER — NITROGLYCERIN 0.2 MG/ML ON CALL CATH LAB
INTRAVENOUS | Status: AC
  Filled 2012-10-17: qty 1

## 2012-10-17 NOTE — Interval H&P Note (Signed)
History and Physical Interval Note:  10/17/2012 4:23 PM  Benjamin [name]  has presented today for cardiac cath with the diagnosis of NSTEMI. The various methods of treatment have been discussed with the patient and family. After consideration of risks, benefits and other options for treatment, the patient has consented to  Procedure(s): LEFT HEART CATHETERIZATION WITH CORONARY ANGIOGRAM (N/A) as a surgical intervention .  The patient's history has been reviewed, patient examined, no change in status, stable for surgery.  I have reviewed the patient's chart and labs.  Questions were answered to the patient's satisfaction.    Cath Lab Visit (complete for each Cath Lab visit)  Clinical Evaluation Leading to the Procedure:   ACS: yes  Non-ACS:    Anginal Classification: CCS IV  Anti-ischemic medical therapy: Maximal Therapy (2 or more classes of medications)  Non-Invasive Test Results: No non-invasive testing performed  Prior CABG: No previous CABG        MCALHANY,CHRISTOPHER

## 2012-10-17 NOTE — CV Procedure (Signed)
Cardiac Catheterization Operative Report  Benjamin [name] [id number] 7/31/20146:07 PM Ginette Otto, MD  Procedure Performed:  1. Left Heart Catheterization 2. Selective Coronary Angiography 3. Left ventricular angiogram 4. PTCA/DES x 1 mid RCA 5. PTCA/DES x 1 proximal Circumflex  Operator: Verne Carrow, MD  Arterial access site:  Right radial artery.   Indication: 67 yo male with prior history of CAD, lung cancer admitted with NSTEMI. Cath in 2001 with occluded distal Circumflex. Repeat cath 2008 and distal Circumflex had recanalized with residual 90% stenosis involving distal OM branch, not treated at that time and he did well with that.                                     Procedure Details: The risks, benefits, complications, treatment options, and expected outcomes were discussed with the patient. The patient and/or family concurred with the proposed plan, giving informed consent. The patient was brought to the cath lab after IV hydration was begun and oral premedication was given. The patient was further sedated with Versed and Fentanyl. The right wrist was assessed with an Allens test which was positive. The right wrist was prepped and draped in a sterile fashion. 1% lidocaine was used for local anesthesia. Using the modified Seldinger access technique, a 5 French sheath was placed in the right radial artery. 3 mg Verapamil was given through the sheath. 4000 units IV heparin was given. Standard diagnostic catheters were used to perform selective coronary angiography. A pigtail catheter was used to perform a left ventricular angiogram. He was found to have total occlusion of the proximal Circumflex artery and severe hazy stenosis in the mid RCA. It was not clear if the Circumflex occlusion was acute as he had some collateral filling from the RCA.   PCI Note: I elected to fix the RCA first to enhance collateral flow to the Circumflex before attempting the PCI of the  Circumflex. He was given a bolus of Angiomax and a drip was started. He was given Effient 60 mg po x 1. I then engaged the RCA with a JR4 guide. When the ACT was greater than 200, I passed a BMW wire down the RCA. A 2.5 x 12 mm balloon was used to pre-dilate the mid RCA stenosis. I then carefully positioned and deployed a 3.0 x 16 mm Promus Premier DES in the mid RCA. This was post-dilated with a 3.25 x 12 mm [place] balloon. The stenosis was taken from 90% down to 0%. This had appeared to be a hazy, unstable plaque. I removed the guide.   I then turned my attention to the Circumflex lesion. A XB 3.0 guide was used to engage the left main. I then passed the BMW wire beyond the totally occluded proximal vessel. A 2.5 x 12 mm balloon was used to open the total occlusion and flow was re-established into the distal vessel. I then deployed a 2.75 x 16 mm Promus Premier DES in the proximal Circumflex. This was post-dilated with a 3.0 x 12 mm [place] balloon x 1. The stenosis was taken from 100% down to 0%. The sheath was removed from the right radial artery and a Terumo hemostasis band was applied at the arteriotomy site on the right wrist.    There were no immediate complications. The patient was taken to the recovery area in stable condition.   Hemodynamic Findings: Central aortic pressure: 166/86 Left ventricular  pressure: 166/18/25  Angiographic Findings:  Left main:  No obstructive disease.   Left Anterior Descending Artery: Large caliber vessel that courses to the apex. The mid vessel has diffuse 50% stenosis. There are several diagonal branches. The first diagonal branch is moderate in caliber with proximal 50% stenosis. The second and third diagonal branches are small in caliber with mild proximal disease.   Circumflex Artery: 100% proximal occlusion after a very early moderate caliber OM branch. The first OM branch has diffuse 30% stenosis. After PCI, the mid AV groove Circumflex is seen to fill three OM  branches. The AV groove Circumflex has diffuse 80-90% stenosis between the takeoff of the second and third OM branches with same appearance as the cath in 2008.   Right Coronary Artery: Large caliber dominant vessel with hazy 90% mid stenosis with appearance of unstable plaque.   Left Ventricular Angiogram: LVEF=50-55%.   Impression: 1. Triple vessel CAD with NSTEMI secondary to acute occlusion of Circumflex.  2. Severe stenosis, unstable plaque mid RCA 3. NSTEMI 4. Preserved LV systolic function 5. Successful PTCA/DES x 1 mid RCA 6. Successful PTCA/DES x 1 proximal Circumflex  Recommendations: He will need dual anti-platelet therapy with ASA and Effient for one year. Continue statin. No beta blocker with heart block.        Complications:  None. The patient tolerated the procedure well.

## 2012-10-17 NOTE — Progress Notes (Signed)
Anticoagulation Consult for Heparin Indication: NSTEMI  Heparin level = 0.29 on 1400 units/hr Goal heparin level = 0.3-0.7  Plan:  1) Increase heparin to 1500 units / hr 2) Follow up after cath  Thank you. Okey Regal, PharmD

## 2012-10-17 NOTE — Progress Notes (Signed)
TELEMETRY: Reviewed telemetry pt in NSR with Mobitz 1 second degree AV block. occ PVCs.[place] Mons Vitals:   10/16/12 1900 10/16/12 2000 10/17/12 0010 10/17/12 0424  BP: 130/76 132/75 136/75 147/71  Pulse:      Temp:  98.3 F (36.8 C) 98.3 F (36.8 C) 99.2 F (37.3 C)  TempSrc:  Oral Oral Oral  Resp: 23 15 16 14   Height:      Weight:    213 lb 11.2 oz (96.934 kg)  SpO2:  97% 97% 96%    Intake/Output Summary (Last 24 hours) at 10/17/12 0711 Last data filed at 10/17/12 0600  Gross per 24 hour  Intake 674.48 ml  Output   1700 ml  Net -1025.52 ml    SUBJECTIVE Had recurrent chest pain this am when got up to use urinal. Now pain resolved. Has chronic SOB related to prior lung resection. No dizzyness or palpitations.  LABS: Basic Metabolic Panel:  Recent Labs  40/98/11 0930 10/17/12 0435  NA 138 139  K 3.9 3.5  CL 103 103  CO2 28 28  GLUCOSE 123* 104*  BUN 12 13  CREATININE 0.96 1.01  CALCIUM 8.9 8.5   Liver Function Tests:  Recent Labs  10/17/12 0435  AST 67*  ALT 17  ALKPHOS 55  BILITOT 0.5  PROT 5.8*  ALBUMIN 3.3*   No results found for this basename: LIPASE, AMYLASE,  in the last 72 hours CBC:  Recent Labs  10/16/12 0930 10/17/12 0435  WBC 9.3 8.4  NEUTROABS 7.8*  --   HGB 15.0 14.5  HCT 43.6 39.2  MCV 88.1 88.5  PLT 124* 121*   Cardiac Enzymes:  Recent Labs  10/16/12 1557 10/16/12 2016 10/17/12 0430  TROPONINI 2.27* 6.10* 15.20*   Hemoglobin A1C:  Recent Labs  10/16/12 1558  HGBA1C 5.5   Fasting Lipid Panel:  Recent Labs  10/17/12 0435  CHOL 165  HDL 40  LDLCALC 109*  TRIG 80  CHOLHDL 4.1   Thyroid Function Tests:  Recent Labs  10/16/12 1558  TSH 0.774    Radiology/Studies:  Dg Chest 2 View  10/16/2012   *RADIOLOGY REPORT*  Clinical Data: Previous history of lung cancer with left shoulder pain  CHEST - 2 VIEW  Comparison: CT scan 07/02/2012  Findings: Laterally in the left upper lobe there is a pleural-based 7 x  4.5 cm mass like opacity, which was present on the prior CT scan.  There is associated left hilar retraction and postsurgical change.  The heart size and vascular pattern are normal.  The right lung is clear.  There is mild compensatory hyperinflation of the right lung with mild shift of the mediastinal contents toward the left.  IMPRESSION: No acute findings.  Stable pleural based mass like density in the left upper lobe peripherally.   Original Report Authenticated By: Esperanza Heir, M.D.   Ct Angio Chest Aortic Dissect W &/or W/o  10/16/2012   *RADIOLOGY REPORT*  Clinical Data:  Severe chest pain, history of lung cancer  CT ANGIOGRAPHY CHEST, ABDOMEN AND PELVIS  Technique:  Multidetector CT imaging through the chest, abdomen and pelvis was performed using the standard protocol during bolus administration of intravenous contrast.  Multiplanar reconstructed images including MIPs were obtained and reviewed to evaluate the vascular anatomy.  Contrast: OMNIPAQUE IOHEXOL 350 MG/ML SOLN  Comparison:  CT chest dated 07/02/2012.  PET CT dated 06/28/2009.  CTA CHEST  Findings:  No evidence of intramural hematoma.  No evidence of thoracic  aortic aneurysm or dissection.  Although not optimized for evaluation of the pulmonary arteries, there is no evidence of pulmonary embolism.  Prior left upper lobectomy.  Postsurgical change postsurgical changes with pleural thickening along the posterior left upper hemithorax. Additional postsurgical changes related to prior right upper lung wedge resection.  No suspicious pulmonary nodules.  Mild centrilobular emphysematous changes. No pleural effusion or pneumothorax.  Visualized thyroid is unremarkable.  The heart is top normal in size.  Coronary atherosclerosis.  No suspicious mediastinal, hilar, or axillary lymphadenopathy.  Mild degenerative changes of the thoracic spine.   Review of the MIP images confirms the above findings.  IMPRESSION: No evidence of thoracic aortic  aneurysm or dissection.  Postsurgical changes related to prior left upper lobectomy and right upper lobe wedge resection.  Stable pleural based opacity in the posterior left upper lung, likely postsurgical.  No evidence of recurrent or metastatic disease in the chest.  CTA ABDOMEN AND PELVIS  Findings:  No evidence of abdominal aortic aneurysm or dissection. 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  Liver, spleen, pancreas, and left adrenal gland are within normal limits.  Prior right adrenalectomy.  Gallbladder is unremarkable.  No intrahepatic or extrahepatic ductal dilatation.  Kidneys are within normal limits.  No hydronephrosis.  No evidence of bowel obstruction.  Normal appendix.  No abdominopelvic ascites.  No suspicious abdominopelvic lymphadenopathy.  Mild prostatomegaly, measuring 5.0 cm in transverse dimension.  Ureteral or bladder calculi.  Bladder is within normal limits.  Grade II spondylolisthesis of L4 on L5.  Mild to moderate degenerative changes of the lumbar spine. Scattered benign bone islands.   Review of the MIP images confirms the above findings.  IMPRESSION: No evidence of abdominal aortic aneurysm or dissection.  No evidence of metastatic disease in the abdomen/pelvis.   Original Report Authenticated By: Charline Bills, M.D.   Ct Angio Abd/pel W/ And/or W/o  10/16/2012   *RADIOLOGY REPORT*  Clinical Data:  Severe chest pain, history of lung cancer  CT ANGIOGRAPHY CHEST, ABDOMEN AND PELVIS  Technique:  Multidetector CT imaging through the chest, abdomen and pelvis was performed using the standard protocol during bolus administration of intravenous contrast.  Multiplanar reconstructed images including MIPs were obtained and reviewed to evaluate the vascular anatomy.  Contrast: OMNIPAQUE IOHEXOL 350 MG/ML SOLN  Comparison:  CT chest dated 07/02/2012.  PET CT dated 06/28/2009.  CTA CHEST  Findings:  No evidence of  intramural hematoma.  No evidence of thoracic aortic aneurysm or dissection.  Although not optimized for evaluation of the pulmonary arteries, there is no evidence of pulmonary embolism.  Prior left upper lobectomy.  Postsurgical change postsurgical changes with pleural thickening along the posterior left upper hemithorax. Additional postsurgical changes related to prior right upper lung wedge resection.  No suspicious pulmonary nodules.  Mild centrilobular emphysematous changes. No pleural effusion or pneumothorax.  Visualized thyroid is unremarkable.  The heart is top normal in size.  Coronary atherosclerosis.  No suspicious mediastinal, hilar, or axillary lymphadenopathy.  Mild degenerative changes of the thoracic spine.   Review of the MIP images confirms the above findings.  IMPRESSION: No evidence of thoracic aortic aneurysm or dissection.  Postsurgical changes related to prior left upper lobectomy and right upper lobe wedge resection.  Stable pleural based opacity in the posterior left upper lung, likely postsurgical.  No evidence of recurrent or metastatic disease in the chest.  CTA ABDOMEN AND PELVIS  Findings:  No evidence of  abdominal aortic aneurysm or dissection. 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  Liver, spleen, pancreas, and left adrenal gland are within normal limits.  Prior right adrenalectomy.  Gallbladder is unremarkable.  No intrahepatic or extrahepatic ductal dilatation.  Kidneys are within normal limits.  No hydronephrosis.  No evidence of bowel obstruction.  Normal appendix.  No abdominopelvic ascites.  No suspicious abdominopelvic lymphadenopathy.  Mild prostatomegaly, measuring 5.0 cm in transverse dimension.  Ureteral or bladder calculi.  Bladder is within normal limits.  Grade II spondylolisthesis of L4 on L5.  Mild to moderate degenerative changes of the lumbar spine. Scattered benign bone islands.   Review of the MIP  images confirms the above findings.  IMPRESSION: No evidence of abdominal aortic aneurysm or dissection.  No evidence of metastatic disease in the abdomen/pelvis.   Original Report Authenticated By: Charline Bills, M.D.   ECG: sinus brady, ST depression in the inferolateral leads.  PHYSICAL EXAM General: Well developed, well nourished, in no acute distress. Head: Normocephalic, atraumatic, sclera non-icteric, no xanthomas, nares are without discharge. Neck: Negative for carotid bruits. JVD not elevated. Lungs: Clear bilaterally to auscultation without wheezes, rales, or rhonchi. Breathing is unlabored. Heart: RRR S1 S2 without murmurs, rubs, or gallops.  Abdomen: Soft, non-tender, non-distended with normoactive bowel sounds. No hepatomegaly. No rebound/guarding. No obvious abdominal masses. Msk:  Strength and tone appears normal for age. Extremities: Tr. edema.  Distal pedal pulses are 2+ and equal bilaterally. Neuro: Alert and oriented X 3. Moves all extremities spontaneously. Psych:  Responds to questions appropriately with a normal affect.  ASSESSMENT AND PLAN: 1. NSTEMI with recurrent chest pain on IV heparin. Class 4 angina. History of chronic occlusion of LCx. For cardiac cath today. On ASA, IV heparin and Ntg. Not a candidate for beta blocker due to AV block and bradycardia. 2. Hyperlipidemia. Now on statin. 3. History of Afib. Now in NSR.  4. Chronic thrombocytopenia. 5. 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 6. History of lung CA s/p resection x 2 and RT.  Principal Problem:   Non-ST elevation myocardial infarction (NSTEMI), initial episode of care Active Problems:   Anxiety   A-fib    Signed, Denim Kalmbach [place] MD,FACC 10/17/2012 7:16 AM

## 2012-10-17 NOTE — H&P (View-Only) (Signed)
 TELEMETRY: Reviewed telemetry pt in NSR with Mobitz 1 second degree AV block. occ PVCs.: Filed Vitals:   10/16/12 1900 10/16/12 2000 10/17/12 0010 10/17/12 0424  BP: 130/76 132/75 136/75 147/71  Pulse:      Temp:  98.3 F (36.8 C) 98.3 F (36.8 C) 99.2 F (37.3 C)  TempSrc:  Oral Oral Oral  Resp: 23 15 16 14  Height:      Weight:    213 lb 11.2 oz (96.934 kg)  SpO2:  97% 97% 96%    Intake/Output Summary (Last 24 hours) at 10/17/12 0711 Last data filed at 10/17/12 0600  Gross per 24 hour  Intake 674.48 ml  Output   1700 ml  Net -1025.52 ml    SUBJECTIVE Had recurrent chest pain this am when got up to use urinal. Now pain resolved. Has chronic SOB related to prior lung resection. No dizzyness or palpitations.  LABS: Basic Metabolic Panel:  Recent Labs  10/16/12 0930 10/17/12 0435  NA 138 139  K 3.9 3.5  CL 103 103  CO2 28 28  GLUCOSE 123* 104*  BUN 12 13  CREATININE 0.96 1.01  CALCIUM 8.9 8.5   Liver Function Tests:  Recent Labs  10/17/12 0435  AST 67*  ALT 17  ALKPHOS 55  BILITOT 0.5  PROT 5.8*  ALBUMIN 3.3*   No results found for this basename: LIPASE, AMYLASE,  in the last 72 hours CBC:  Recent Labs  10/16/12 0930 10/17/12 0435  WBC 9.3 8.4  NEUTROABS 7.8*  --   HGB 15.0 14.5  HCT 43.6 39.2  MCV 88.1 88.5  PLT 124* 121*   Cardiac Enzymes:  Recent Labs  10/16/12 1557 10/16/12 2016 10/17/12 0430  TROPONINI 2.27* 6.10* 15.20*   Hemoglobin A1C:  Recent Labs  10/16/12 1558  HGBA1C 5.5   Fasting Lipid Panel:  Recent Labs  10/17/12 0435  CHOL 165  HDL 40  LDLCALC 109*  TRIG 80  CHOLHDL 4.1   Thyroid Function Tests:  Recent Labs  10/16/12 1558  TSH 0.774    Radiology/Studies:  Dg Chest 2 View  10/16/2012   *RADIOLOGY REPORT*  Clinical Data: Previous history of lung cancer with left shoulder pain  CHEST - 2 VIEW  Comparison: CT scan 07/02/2012  Findings: Laterally in the left upper lobe there is a pleural-based 7 x  4.5 cm mass like opacity, which was present on the prior CT scan.  There is associated left hilar retraction and postsurgical change.  The heart size and vascular pattern are normal.  The right lung is clear.  There is mild compensatory hyperinflation of the right lung with mild shift of the mediastinal contents toward the left.  IMPRESSION: No acute findings.  Stable pleural based mass like density in the left upper lobe peripherally.   Original Report Authenticated By: Raymond Rubner, M.D.   Ct Angio Chest Aortic Dissect W &/or W/o  10/16/2012   *RADIOLOGY REPORT*  Clinical Data:  Severe chest pain, history of lung cancer  CT ANGIOGRAPHY CHEST, ABDOMEN AND PELVIS  Technique:  Multidetector CT imaging through the chest, abdomen and pelvis was performed using the standard protocol during bolus administration of intravenous contrast.  Multiplanar reconstructed images including MIPs were obtained and reviewed to evaluate the vascular anatomy.  Contrast: 100mL OMNIPAQUE IOHEXOL 350 MG/ML SOLN  Comparison:  CT chest dated 07/02/2012.  PET CT dated 06/28/2009.  CTA CHEST  Findings:  No evidence of intramural hematoma.  No evidence of thoracic   aortic aneurysm or dissection.  Although not optimized for evaluation of the pulmonary arteries, there is no evidence of pulmonary embolism.  Prior left upper lobectomy.  Postsurgical change postsurgical changes with pleural thickening along the posterior left upper hemithorax. Additional postsurgical changes related to prior right upper lung wedge resection.  No suspicious pulmonary nodules.  Mild centrilobular emphysematous changes. No pleural effusion or pneumothorax.  Visualized thyroid is unremarkable.  The heart is top normal in size.  Coronary atherosclerosis.  No suspicious mediastinal, hilar, or axillary lymphadenopathy.  Mild degenerative changes of the thoracic spine.   Review of the MIP images confirms the above findings.  IMPRESSION: No evidence of thoracic aortic  aneurysm or dissection.  Postsurgical changes related to prior left upper lobectomy and right upper lobe wedge resection.  Stable pleural based opacity in the posterior left upper lung, likely postsurgical.  No evidence of recurrent or metastatic disease in the chest.  CTA ABDOMEN AND PELVIS  Findings:  No evidence of abdominal aortic aneurysm or dissection. 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  Liver, spleen, pancreas, and left adrenal gland are within normal limits.  Prior right adrenalectomy.  Gallbladder is unremarkable.  No intrahepatic or extrahepatic ductal dilatation.  Kidneys are within normal limits.  No hydronephrosis.  No evidence of bowel obstruction.  Normal appendix.  No abdominopelvic ascites.  No suspicious abdominopelvic lymphadenopathy.  Mild prostatomegaly, measuring 5.0 cm in transverse dimension.  Ureteral or bladder calculi.  Bladder is within normal limits.  Grade II spondylolisthesis of L4 on L5.  Mild to moderate degenerative changes of the lumbar spine. Scattered benign bone islands.   Review of the MIP images confirms the above findings.  IMPRESSION: No evidence of abdominal aortic aneurysm or dissection.  No evidence of metastatic disease in the abdomen/pelvis.   Original Report Authenticated By: Sriyesh Krishnan, M.D.   Ct Angio Abd/pel W/ And/or W/o  10/16/2012   *RADIOLOGY REPORT*  Clinical Data:  Severe chest pain, history of lung cancer  CT ANGIOGRAPHY CHEST, ABDOMEN AND PELVIS  Technique:  Multidetector CT imaging through the chest, abdomen and pelvis was performed using the standard protocol during bolus administration of intravenous contrast.  Multiplanar reconstructed images including MIPs were obtained and reviewed to evaluate the vascular anatomy.  Contrast: 100mL OMNIPAQUE IOHEXOL 350 MG/ML SOLN  Comparison:  CT chest dated 07/02/2012.  PET CT dated 06/28/2009.  CTA CHEST  Findings:  No evidence of  intramural hematoma.  No evidence of thoracic aortic aneurysm or dissection.  Although not optimized for evaluation of the pulmonary arteries, there is no evidence of pulmonary embolism.  Prior left upper lobectomy.  Postsurgical change postsurgical changes with pleural thickening along the posterior left upper hemithorax. Additional postsurgical changes related to prior right upper lung wedge resection.  No suspicious pulmonary nodules.  Mild centrilobular emphysematous changes. No pleural effusion or pneumothorax.  Visualized thyroid is unremarkable.  The heart is top normal in size.  Coronary atherosclerosis.  No suspicious mediastinal, hilar, or axillary lymphadenopathy.  Mild degenerative changes of the thoracic spine.   Review of the MIP images confirms the above findings.  IMPRESSION: No evidence of thoracic aortic aneurysm or dissection.  Postsurgical changes related to prior left upper lobectomy and right upper lobe wedge resection.  Stable pleural based opacity in the posterior left upper lung, likely postsurgical.  No evidence of recurrent or metastatic disease in the chest.  CTA ABDOMEN AND PELVIS  Findings:  No evidence of   abdominal aortic aneurysm or dissection. 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  Liver, spleen, pancreas, and left adrenal gland are within normal limits.  Prior right adrenalectomy.  Gallbladder is unremarkable.  No intrahepatic or extrahepatic ductal dilatation.  Kidneys are within normal limits.  No hydronephrosis.  No evidence of bowel obstruction.  Normal appendix.  No abdominopelvic ascites.  No suspicious abdominopelvic lymphadenopathy.  Mild prostatomegaly, measuring 5.0 cm in transverse dimension.  Ureteral or bladder calculi.  Bladder is within normal limits.  Grade II spondylolisthesis of L4 on L5.  Mild to moderate degenerative changes of the lumbar spine. Scattered benign bone islands.   Review of the MIP  images confirms the above findings.  IMPRESSION: No evidence of abdominal aortic aneurysm or dissection.  No evidence of metastatic disease in the abdomen/pelvis.   Original Report Authenticated By: Sriyesh Krishnan, M.D.   ECG: sinus brady, ST depression in the inferolateral leads.  PHYSICAL EXAM General: Well developed, well nourished, in no acute distress. Head: Normocephalic, atraumatic, sclera non-icteric, no xanthomas, nares are without discharge. Neck: Negative for carotid bruits. JVD not elevated. Lungs: Clear bilaterally to auscultation without wheezes, rales, or rhonchi. Breathing is unlabored. Heart: RRR S1 S2 without murmurs, rubs, or gallops.  Abdomen: Soft, non-tender, non-distended with normoactive bowel sounds. No hepatomegaly. No rebound/guarding. No obvious abdominal masses. Msk:  Strength and tone appears normal for age. Extremities: Tr. edema.  Distal pedal pulses are 2+ and equal bilaterally. Neuro: Alert and oriented X 3. Moves all extremities spontaneously. Psych:  Responds to questions appropriately with a normal affect.  ASSESSMENT AND PLAN: 1. NSTEMI with recurrent chest pain on IV heparin. Class 4 angina. History of chronic occlusion of LCx. For cardiac cath today. On ASA, IV heparin and Ntg. Not a candidate for beta blocker due to AV block and bradycardia. 2. Hyperlipidemia. Now on statin. 3. History of Afib. Now in NSR.  4. Chronic thrombocytopenia. 5. 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 6. History of lung CA s/p resection x 2 and RT.  Principal Problem:   Non-ST elevation myocardial infarction (NSTEMI), initial episode of care Active Problems:   Anxiety   A-fib    Signed, Moua Rasmusson MD,FACC 10/17/2012 7:16 AM    

## 2012-10-18 ENCOUNTER — Telehealth: Payer: Self-pay | Admitting: Cardiology

## 2012-10-18 ENCOUNTER — Encounter (HOSPITAL_COMMUNITY): Payer: Self-pay | Admitting: Nurse Practitioner

## 2012-10-18 DIAGNOSIS — I441 Atrioventricular block, second degree: Secondary | ICD-10-CM | POA: Diagnosis not present

## 2012-10-18 DIAGNOSIS — I4891 Unspecified atrial fibrillation: Secondary | ICD-10-CM | POA: Diagnosis not present

## 2012-10-18 DIAGNOSIS — I214 Non-ST elevation (NSTEMI) myocardial infarction: Secondary | ICD-10-CM | POA: Diagnosis not present

## 2012-10-18 DIAGNOSIS — D696 Thrombocytopenia, unspecified: Secondary | ICD-10-CM | POA: Diagnosis not present

## 2012-10-18 DIAGNOSIS — I2582 Chronic total occlusion of coronary artery: Secondary | ICD-10-CM | POA: Diagnosis not present

## 2012-10-18 DIAGNOSIS — I2510 Atherosclerotic heart disease of native coronary artery without angina pectoris: Secondary | ICD-10-CM | POA: Diagnosis not present

## 2012-10-18 LAB — BASIC METABOLIC PANEL
CO2: 23 mEq/L (ref 19–32)
Calcium: 8.8 mg/dL (ref 8.4–10.5)
GFR calc non Af Amer: 84 mL/min — ABNORMAL LOW (ref >90)
Sodium: 139 mEq/L (ref 135–145)

## 2012-10-18 MED ORDER — ASPIRIN 81 MG PO TBEC: 81.0000 mg | DELAYED_RELEASE_TABLET | Freq: Every day | ORAL | Status: DC

## 2012-10-18 MED ORDER — PRASUGREL HCL 10 MG PO TABS: 10.0000 mg | ORAL_TABLET | Freq: Every day | ORAL | Status: DC

## 2012-10-18 MED ORDER — NITROGLYCERIN 0.4 MG SL SUBL: 0.4000 mg | SUBLINGUAL_TABLET | SUBLINGUAL | Status: AC | PRN

## 2012-10-18 MED ORDER — ATORVASTATIN CALCIUM 80 MG PO TABS: 80.0000 mg | ORAL_TABLET | Freq: Every day | ORAL | Status: DC

## 2012-10-18 MED FILL — Sodium Chloride IV Soln 0.9%: INTRAVENOUS | Qty: 50 | Status: AC

## 2012-10-18 NOTE — Progress Notes (Signed)
CARDIAC REHAB PHASE I   PRE:  Rate/Rhythm: 70 afib with PVC  BP:  Sitting: 145/84     SaO2: 96 RA  MODE:  Ambulation: 450 ft   POST:  Rate/Rhythm: 75 afib  BP:  Sitting: 157/82    SaO2: 95 RA  Pt walked 450 ft with c/o SOB, with assist x1 and x3 rest periods.  Pts gait was steady during ambulation.  Reviewed education and NTG use.  Pt is interested in [facility] CRP II. [id number]  Marvene Staff MS, ACSM RCEP 8:47 AM 10/18/2012

## 2012-10-18 NOTE — Discharge Summary (Signed)
Patient ID: Benjamin [name],  MRN: [id number], DOB/AGE: 1946-03-01 67 y.o.  Admit date: 10/16/2012 Discharge date: 10/18/2012  Primary Care Provider: Ginette [name] Primary Cardiologist: Benjamin Right, MD   Discharge Diagnoses Principal Problem:   Non-ST elevation myocardial infarction (NSTEMI), initial episode of care  **s/p PCI/DES of the RCA and LCX this admission. Active Problems:   CAD (coronary artery disease)   Paroxysmal Afib  **With rate controlled paroxysms during this admission.  **CHA2DS2VASc = 2 - not currently on longterm anticoagulation in the setting of dual antiplatelet therapy however this will require attention in the future.   Mobitz type 1 second degree atrioventricular block   Anxiety  Allergies Allergies  Allergen Reactions  . Codeine Other (See Comments)    Dizziness, stomach pain   Procedures  Cardiac Catheterization and Percutaneous Coronary Intervention 7.31.2014  Hemodynamic Findings: Central aortic pressure: 166/86 Left ventricular pressure: 166/18/25  Angiographic Findings:  Left main:  No obstructive disease.   Left Anterior Descending Artery: Large caliber vessel that courses to the apex. The mid vessel has diffuse 50% stenosis. There are several diagonal branches. The first diagonal branch is moderate in caliber with proximal 50% stenosis. The second and third diagonal branches are small in caliber with mild proximal disease.   Circumflex Artery: 100% proximal occlusion after a very early moderate caliber OM branch. The first OM branch has diffuse 30% stenosis. After PCI, the mid AV groove Circumflex is seen to fill three OM branches. The AV groove Circumflex has diffuse 80-90% stenosis between the takeoff of the second and third OM branches with same appearance as the cath in 2008.     **The LCX was successfully stented using a 2.75 x 16 mm Promus Premier DES**  [name] Coronary Artery: Large caliber dominant vessel with hazy 90% mid stenosis  with appearance of unstable plaque.    **The RCA was successfully stented using a 3.0 x 16 mm Promus Premier DES**  Left Ventricular Angiogram: LVEF=50-55%.  _____________   History of Present Illness  67 year old male with prior history of coronary artery disease and known prior left circumflex occlusion based on catheterization 2001. Followup catheterization 2008 showed recanalization of the left circumflex and otherwise nonobstructive disease. Patient does have a history of chronic stable angina and dyspnea on exertion. In the early morning hours of July 30, he got up to use the restroom and developed substernal chest discomfort radiating down his left arm and into his left shoulder and left scapular area. Pain was similar to prior angina but more severe and associated with dyspnea and diaphoresis. When symptoms did not resolve with sublingual nitroglycerin, EMS was called and he was subsequently treated with additional nitroglycerin and aspirin with some improvement in symptoms. He presented to the [facility] and subsequently required IV nitroglycerin. Although initial ECG showed no acute ST or T changes, troponin was elevated at 0.33. He was admitted for further evaluation and management of non-ST segment elevation myocardial infarction.  Hospital Course  [name] eventually peaked his troponin at 15.20. He had no further chest discomfort. He did briefly have rate controlled paroxysmal atrial fibrillation which was asymptomatic. He was not placed on beta blocker therapy secondary to baseline bradycardia and history of Mobitz 2 heart block. He underwent diagnostic catheterization on July 31 which revealed total occlusion of the previously recanalized left circumflex and also a hazy stenosis in the proximal [name] coronary artery. Both areas were successfully stented using Promus Premier drug-eluting stents as outlined above. Patient tolerated procedure  well and post procedure has been ambulating  without recurrent symptoms or limitations. He will be discharged home today in good condition with early followup arranged for next week.  Discharge Vitals Blood pressure 157/82, pulse 66, temperature 97.7 F (36.5 C), temperature source Oral, resp. rate 18, height 5\' 11"  (1.803 m), weight 212 lb 11.9 oz (96.5 kg), SpO2 97.00%.  Filed Weights   10/16/12 1215 10/17/12 0424 10/18/12 0500  Weight: 217 lb (98.431 kg) 213 lb 11.2 oz (96.934 kg) 212 lb 11.9 oz (96.5 kg)   Labs  CBC  Recent Labs  10/16/12 0930 10/17/12 0435  WBC 9.3 8.4  NEUTROABS 7.8*  --   HGB 15.0 14.5  HCT 43.6 39.2  MCV 88.1 88.5  PLT 124* 121*   Basic Metabolic Panel  Recent Labs  10/17/12 0435 10/18/12 0545  NA 139 139  K 3.5 4.2  CL 103 103  CO2 28 23  GLUCOSE 104* 100*  BUN 13 10  CREATININE 1.01 0.91  CALCIUM 8.5 8.8   Liver Function Tests  Recent Labs  10/17/12 0435  AST 67*  ALT 17  ALKPHOS 55  BILITOT 0.5  PROT 5.8*  ALBUMIN 3.3*   Cardiac Enzymes  Recent Labs  10/16/12 1557 10/16/12 2016 10/17/12 0430  TROPONINI 2.27* 6.10* 15.20*   Hemoglobin A1C  Recent Labs  10/16/12 1558  HGBA1C 5.5   Fasting Lipid Panel  Recent Labs  10/17/12 0435  CHOL 165  HDL 40  LDLCALC 109*  TRIG 80  CHOLHDL 4.1   Thyroid Function Tests  Recent Labs  10/16/12 1558  TSH 0.774   Disposition  Pt is being discharged home today in good condition.  Follow-up Plans & Appointments  Follow-up Information   Follow up with EDMISTEN, BROOKE, PA-C On 10/25/2012. (11:30 AM - Dr. Harvie Bridge PA)    Contact information:   [address] [place] [address] [phone]       Follow up with Benjamin Otto, MD. (as scheduled.)    Contact information:   [address] [place] [address] [phone]     Discharge Medications    Medication List         aspirin 81 MG EC tablet  Take 1 tablet (81 mg total) by mouth daily.     atorvastatin 80  MG tablet  Commonly known as:  LIPITOR  Take 1 tablet (80 mg total) by mouth daily at 6 PM.     clonazePAM 1 MG tablet  Commonly known as:  KLONOPIN  Take 1 mg by mouth 3 (three) times daily.     nitroGLYCERIN 0.4 MG SL tablet  Commonly known as:  NITROSTAT  Place 1 tablet (0.4 mg total) under the tongue every 5 (five) minutes x 3 doses as needed for chest pain.     prasugrel 10 MG Tabs  Commonly known as:  EFFIENT  Take 1 tablet (10 mg total) by mouth daily.      Outstanding Labs/Studies  F/u lipids/lft's in 6-8 wks.  Duration of Discharge Encounter   Greater than 30 minutes including physician time.  Signed, Nicolasa Ducking NP 10/18/2012, 8:33 AM

## 2012-10-18 NOTE — Progress Notes (Signed)
TELEMETRY: Reviewed telemetry pt in atrial fibrillation with controlled response..: Filed Vitals:   10/18/12 0100 10/18/12 0200 10/18/12 0400 10/18/12 0500  BP: 130/74 139/70 133/70 133/70  Pulse: 46 52 50 66  Temp: 98.1 F (36.7 C)   97.7 F (36.5 C)  TempSrc: Oral   Oral  Resp: 18 18 18 18   Height:      Weight:    212 lb 11.9 oz (96.5 kg)  SpO2: 96% 94% 97% 97%    Intake/Output Summary (Last 24 hours) at 10/18/12 0719 Last data filed at 10/17/12 2300  Gross per 24 hour  Intake 1327.8 ml  Output    600 ml  Net  727.8 ml    SUBJECTIVE Feeling well today. Mild ache in chest. Notes that he has been in and out of atrial fibrillation for years.  LABS: Basic Metabolic Panel:  Recent Labs  16/10/96 0930 10/17/12 0435  NA 138 139  K 3.9 3.5  CL 103 103  CO2 28 28  GLUCOSE 123* 104*  BUN 12 13  CREATININE 0.96 1.01  CALCIUM 8.9 8.5   Liver Function Tests:  Recent Labs  10/17/12 0435  AST 67*  ALT 17  ALKPHOS 55  BILITOT 0.5  PROT 5.8*  ALBUMIN 3.3*   No results found for this basename: LIPASE, AMYLASE,  in the last 72 hours CBC:  Recent Labs  10/16/12 0930 10/17/12 0435  WBC 9.3 8.4  NEUTROABS 7.8*  --   HGB 15.0 14.5  HCT 43.6 39.2  MCV 88.1 88.5  PLT 124* 121*   Cardiac Enzymes:  Recent Labs  10/16/12 1557 10/16/12 2016 10/17/12 0430  TROPONINI 2.27* 6.10* 15.20*   Hemoglobin A1C:  Recent Labs  10/16/12 1558  HGBA1C 5.5   Fasting Lipid Panel:  Recent Labs  10/17/12 0435  CHOL 165  HDL 40  LDLCALC 109*  TRIG 80  CHOLHDL 4.1   Thyroid Function Tests:  Recent Labs  10/16/12 1558  TSH 0.774    Radiology/Studies:  Dg Chest 2 View  10/16/2012   *RADIOLOGY REPORT*  Clinical Data: Previous history of lung cancer with left shoulder pain  CHEST - 2 VIEW  Comparison: CT scan 07/02/2012  Findings: Laterally in the left upper lobe there is a pleural-based 7 x 4.5 cm mass like opacity, which was present on the prior CT scan.   There is associated left hilar retraction and postsurgical change.  The heart size and vascular pattern are normal.  The right lung is clear.  There is mild compensatory hyperinflation of the right lung with mild shift of the mediastinal contents toward the left.  IMPRESSION: No acute findings.  Stable pleural based mass like density in the left upper lobe peripherally.   Original Report Authenticated By: Esperanza Heir, M.D.   Ct Angio Chest Aortic Dissect W &/or W/o  10/16/2012   *RADIOLOGY REPORT*  Clinical Data:  Severe chest pain, history of lung cancer  CT ANGIOGRAPHY CHEST, ABDOMEN AND PELVIS  Technique:  Multidetector CT imaging through the chest, abdomen and pelvis was performed using the standard protocol during bolus administration of intravenous contrast.  Multiplanar reconstructed images including MIPs were obtained and reviewed to evaluate the vascular anatomy.  Contrast: OMNIPAQUE IOHEXOL 350 MG/ML SOLN  Comparison:  CT chest dated 07/02/2012.  PET CT dated 06/28/2009.  CTA CHEST  Findings:  No evidence of intramural hematoma.  No evidence of thoracic aortic aneurysm or dissection.  Although not optimized for evaluation of the pulmonary  arteries, there is no evidence of pulmonary embolism.  Prior left upper lobectomy.  Postsurgical change postsurgical changes with pleural thickening along the posterior left upper hemithorax. Additional postsurgical changes related to prior right upper lung wedge resection.  No suspicious pulmonary nodules.  Mild centrilobular emphysematous changes. No pleural effusion or pneumothorax.  Visualized thyroid is unremarkable.  The heart is top normal in size.  Coronary atherosclerosis.  No suspicious mediastinal, hilar, or axillary lymphadenopathy.  Mild degenerative changes of the thoracic spine.   Review of the MIP images confirms the above findings.  IMPRESSION: No evidence of thoracic aortic aneurysm or dissection.  Postsurgical changes related to prior left  upper lobectomy and right upper lobe wedge resection.  Stable pleural based opacity in the posterior left upper lung, likely postsurgical.  No evidence of recurrent or metastatic disease in the chest.  CTA ABDOMEN AND PELVIS  Findings:  No evidence of abdominal aortic aneurysm or dissection. 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  Liver, spleen, pancreas, and left adrenal gland are within normal limits.  Prior right adrenalectomy.  Gallbladder is unremarkable.  No intrahepatic or extrahepatic ductal dilatation.  Kidneys are within normal limits.  No hydronephrosis.  No evidence of bowel obstruction.  Normal appendix.  No abdominopelvic ascites.  No suspicious abdominopelvic lymphadenopathy.  Mild prostatomegaly, measuring 5.0 cm in transverse dimension.  Ureteral or bladder calculi.  Bladder is within normal limits.  Grade II spondylolisthesis of L4 on L5.  Mild to moderate degenerative changes of the lumbar spine. Scattered benign bone islands.   Review of the MIP images confirms the above findings.  IMPRESSION: No evidence of abdominal aortic aneurysm or dissection.  No evidence of metastatic disease in the abdomen/pelvis.   Original Report Authenticated By: Charline Bills, M.D.   Ct Angio Abd/pel W/ And/or W/o  10/16/2012   *RADIOLOGY REPORT*  Clinical Data:  Severe chest pain, history of lung cancer  CT ANGIOGRAPHY CHEST, ABDOMEN AND PELVIS  Technique:  Multidetector CT imaging through the chest, abdomen and pelvis was performed using the standard protocol during bolus administration of intravenous contrast.  Multiplanar reconstructed images including MIPs were obtained and reviewed to evaluate the vascular anatomy.  Contrast: OMNIPAQUE IOHEXOL 350 MG/ML SOLN  Comparison:  CT chest dated 07/02/2012.  PET CT dated 06/28/2009.  CTA CHEST  Findings:  No evidence of intramural hematoma.  No evidence of thoracic aortic aneurysm or  dissection.  Although not optimized for evaluation of the pulmonary arteries, there is no evidence of pulmonary embolism.  Prior left upper lobectomy.  Postsurgical change postsurgical changes with pleural thickening along the posterior left upper hemithorax. Additional postsurgical changes related to prior right upper lung wedge resection.  No suspicious pulmonary nodules.  Mild centrilobular emphysematous changes. No pleural effusion or pneumothorax.  Visualized thyroid is unremarkable.  The heart is top normal in size.  Coronary atherosclerosis.  No suspicious mediastinal, hilar, or axillary lymphadenopathy.  Mild degenerative changes of the thoracic spine.   Review of the MIP images confirms the above findings.  IMPRESSION: No evidence of thoracic aortic aneurysm or dissection.  Postsurgical changes related to prior left upper lobectomy and right upper lobe wedge resection.  Stable pleural based opacity in the posterior left upper lung, likely postsurgical.  No evidence of recurrent or metastatic disease in the chest.  CTA ABDOMEN AND PELVIS  Findings:  No evidence of abdominal aortic aneurysm or dissection. Mild atherosclerotic calcifications with the centric mural thrombus  involving the lower abdominal aorta.  Liver, spleen, pancreas, and left adrenal gland are within normal limits.  Prior right adrenalectomy.  Gallbladder is unremarkable.  No intrahepatic or extrahepatic ductal dilatation.  Kidneys are within normal limits.  No hydronephrosis.  No evidence of bowel obstruction.  Normal appendix.  No abdominopelvic ascites.  No suspicious abdominopelvic lymphadenopathy.  Mild prostatomegaly, measuring 5.0 cm in transverse dimension.  Ureteral or bladder calculi.  Bladder is within normal limits.  Grade II spondylolisthesis of L4 on L5.  Mild to moderate degenerative changes of the lumbar spine. Scattered benign bone islands.   Review of the MIP images confirms the above findings.  IMPRESSION: No evidence of  abdominal aortic aneurysm or dissection.  No evidence of metastatic disease in the abdomen/pelvis.   Original Report Authenticated By: Charline Bills, M.D.   ECG: sinus brady, first degree AV block. ST depression resolved in inferolateral leads.   PHYSICAL EXAM General: Well developed, well nourished, in no acute distress. Head: Normocephalic, atraumatic, sclera non-icteric, no xanthomas, nares are without discharge. Neck: Negative for carotid bruits. JVD not elevated. Lungs: Clear bilaterally to auscultation without wheezes, rales, or rhonchi. Breathing is unlabored. Heart: RRR S1 S2 without murmurs, rubs, or gallops.  Abdomen: Soft, non-tender, non-distended with normoactive bowel sounds. No hepatomegaly. No rebound/guarding. No obvious abdominal masses. Msk:  Strength and tone appears normal for age. Extremities: Tr. edema.  Distal pedal pulses are 2+ and equal bilaterally. Neuro: Alert and oriented X 3. Moves all extremities spontaneously. Psych:  Responds to questions appropriately with a normal affect.  ASSESSMENT AND PLAN: 1. NSTEMI with recurrent chest pain on IV heparin. Class 4 angina. History of chronic occlusion of LCx. Now s/p DES of RCA and LCx.  On ASA and Effient. Not a candidate for beta blocker due to AV block and bradycardia. 2. Hyperlipidemia. Now on statin. 3. History of Afib. Now in afib. Previously in sinus with Mobitz type 2 AV block. [place] score of 1. Will defer anticoagulation now since on dual antiplatelet therapy.  4. Chronic thrombocytopenia. 5. 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 6. History of lung CA s/p resection x 2 and RT.  Disposition. Stable for DC today pending lab work. Encourage cardiac Rehab. Follow up in office in 2 weeks.  Principal Problem:   Non-ST elevation myocardial infarction (NSTEMI), initial episode of care Active Problems:   Anxiety   A-fib   Mobitz type 1 second degree atrioventricular block    Signed, Arash Karstens [place]  MD,FACC 10/18/2012 7:19 AM

## 2012-10-18 NOTE — Care Management Note (Addendum)
  Page 1 of 1   10/18/2012     2:34:54 PM   CARE MANAGEMENT NOTE 10/18/2012  Patient:  Benjamin [name], Benjamin [name]   Account Number:  [id number]  Date Initiated:  10/18/2012  Documentation initiated by:  [facility]  Subjective/Objective Assessment:   67 y.o. male with a history of CAD, AF, lung CA x 2  s/p resection and XRT. // home with spouse     Action/Plan:   heart cath// Effient 10mg  benefits check   Anticipated DC Date:  10/18/2012   Anticipated DC Plan:  HOME/SELF CARE      DC Planning Services  CM consult      Choice offered to / List presented to:             Status of service:   Medicare Important Message given?   (If response is "NO", the following Medicare IM given date fields will be blank) Date Medicare IM given:   Date Additional Medicare IM given:    Discharge Disposition:    Per UR Regulation:  Reviewed for med. necessity/level of care/duration of stay  If discussed at Long Length of Stay Meetings, dates discussed:    Comments:  10/18/12 1145.Marland KitchenMarland KitchenOletta Cohn, RN, BSN, [employer] [phone] Spoke with pt at bedside regarding Effient 10 mg QD.  Pt utilizes [facility] on [address] for prescription needs. NCM called [facility] to verify medication in stock. Pt has Effient brochure with 30day free medication card and prescripton asssistance card intact.  Pt aware of 80% copay of meds ($50.00/month) after inital 30 day free.

## 2012-10-18 NOTE — Progress Notes (Signed)
TR BAND REMOVAL  LOCATION:    right radial  DEFLATED PER PROTOCOL:    yes  TIME BAND OFF / DRESSING APPLIED:    22:30   SITE UPON ARRIVAL:    Level 0  SITE AFTER BAND REMOVAL:    Level 0  REVERSE ALLEN'S TEST:     positive  CIRCULATION SENSATION AND MOVEMENT:    Within Normal Limits   yes  COMMENTS:

## 2012-10-18 NOTE — Telephone Encounter (Signed)
New problem    Pt having 7day TCM w/[facility] on 10/25/12 per Thayer Ohm PA.

## 2012-10-18 NOTE — Discharge Summary (Signed)
Patient seen and examined and history reviewed. Agree with above findings and plan. See prior rounding note.  Benjamin [name] 10/18/2012 8:49 AM

## 2012-10-21 NOTE — Telephone Encounter (Signed)
Patient contacted regarding discharge from [facility] on 10/18/12. Patient understands to follow up with provider Sharrell Ku on 10/25/12  at 11:30 AM at [facility]. Patient understands discharge instructions? yes Patient understands medications and regiment? yes Patient understands to bring all medications to this visit? yes

## 2012-10-25 ENCOUNTER — Ambulatory Visit (INDEPENDENT_AMBULATORY_CARE_PROVIDER_SITE_OTHER): Payer: Medicare Other | Admitting: Cardiology

## 2012-10-25 ENCOUNTER — Encounter: Payer: Self-pay | Admitting: Cardiology

## 2012-10-25 VITALS — BP 120/82 | HR 52 | Ht 71.0 in | Wt 212.0 lb

## 2012-10-25 DIAGNOSIS — I214 Non-ST elevation (NSTEMI) myocardial infarction: Secondary | ICD-10-CM | POA: Diagnosis not present

## 2012-10-25 DIAGNOSIS — Z9861 Coronary angioplasty status: Secondary | ICD-10-CM | POA: Diagnosis not present

## 2012-10-25 DIAGNOSIS — I2510 Atherosclerotic heart disease of native coronary artery without angina pectoris: Secondary | ICD-10-CM | POA: Diagnosis not present

## 2012-10-25 DIAGNOSIS — Z955 Presence of coronary angioplasty implant and graft: Secondary | ICD-10-CM

## 2012-10-25 NOTE — Progress Notes (Signed)
CARDIOLOGY OFFICE NOTE  Patient ID: Benjamin [name] MRN: [id number], DOB/AGE: 05-02-1945   Date of Visit: 10/25/2012  Primary Physician: Ginette Otto, MD Primary Cardiologist: Elease Hashimoto, MD Reason for Visit: Hospital follow-up  History of Present Illness  Benjamin [name] is a 67 y.o. male with known CAD who was recently admitted to [facility] with NSTEMI and underwent cardiac catheterization, subsequent PCI to RCA and LCX who presents today for hospital followup.   Since discharge, he reports he is doing well and has no complaints. He denies chest pain or shortness of breath. He denies palpitations, dizziness, near syncope or syncope. He denies LE swelling, orthopnea, PND or recent weight gain. He is compliant and tolerating medications without difficulty.  Past Medical History Past Medical History  Diagnosis Date  . Adenopathy     RIGHT ADRENAL  . Anxiety   . Arthritis     KNEES/HANDS  . Hx of melanoma of skin   . PAF (paroxysmal atrial fibrillation)     a. not currently on anticoagulation (09/2012)  . Non-small cell lung cancer     a. s/p resection and XRT.  [name] CAD (coronary artery disease)     a. Prior known CTO of LCX with recanalizationby cath 2008;  b. 09/2012 NSTEMI/Cath/PCI: LM nl, LAD 40m, D1 50p, LCX 100p (2.75x16 Promus Premier DES), 80-51m, OM1 30, RCA 68m (3.0x16 Promus Premier DES), EF 50-55%.    Past Surgical History Past Surgical History  Procedure Laterality Date  . Lobectomy left upper lobe  08/15/2001  . Adrenalextomy  02/2000  . Right vats, mini thoracotomy, wedge resection of right upper  01/18/2004  . Cardiac catheterization  2008    LAD 40, D1 OK, D2 50, CFX 90, RCA OK, EF 60%; Consider PCI PRN but the stenosis is quite complex and we would end up jailing several large posterolateral branches      Allergies/Intolerances Allergies  Allergen Reactions  . Codeine Other (See Comments)    Dizziness, stomach pain   Current Home Medications Current Outpatient  Prescriptions  Medication Sig Dispense Refill  . aspirin EC 81 MG EC tablet Take 1 tablet (81 mg total) by mouth daily.      [name] atorvastatin (LIPITOR) 80 MG tablet Take 1 tablet (80 mg total) by mouth daily at 6 PM.  30 tablet  6  . clonazePAM (KLONOPIN) 1 MG tablet Take 1 mg by mouth 3 (three) times daily.       . nitroGLYCERIN (NITROSTAT) 0.4 MG SL tablet Place 1 tablet (0.4 mg total) under the tongue every 5 (five) minutes x 3 doses as needed for chest pain.  25 tablet  3  . prasugrel (EFFIENT) 10 MG TABS Take 1 tablet (10 mg total) by mouth daily.  30 tablet  6   No current facility-administered medications for this visit.   Social History Social History  . Marital Status: Divorced    Spouse Name: N/A    Number of Children: N/A  . Years of Education: N/A   Occupational History  . Retired Therapist, music [employer]   Social History Main Topics  . Smoking status: Former Smoker -- 40 years    Quit date: 08/15/2001  . Smokeless tobacco: Not on file  . Alcohol Use: No  . Drug Use: No  . Sexually Active: Not on file   Review of Systems General: No chills, fever, night sweats or weight changes Cardiovascular: No chest pain, dyspnea on exertion, edema, orthopnea, palpitations, paroxysmal nocturnal dyspnea Dermatological: No rash,  lesions or masses Respiratory: No cough, dyspnea Urologic: No hematuria, dysuria Abdominal: No nausea, vomiting, diarrhea, bright red blood per rectum, melena, or hematemesis Neurologic: No visual changes, weakness, changes in mental status All other systems reviewed and are otherwise negative except as noted above.  Physical Exam Vitals: Blood pressure 120/82, pulse 52, height 5\' 11"  (1.803 m), weight 212 lb (96.163 kg).  General: Well developed, well appearing 67 y.o. male in no acute distress. HEENT: Normocephalic, atraumatic. EOMs intact. Sclera nonicteric. Oropharynx clear.  Neck: Supple without bruits. No JVD. Lungs: Respirations regular and unlabored, CTA  bilaterally. Diminished on left. No wheezes, rales or rhonchi. Heart: RRR. S1, S2 present. No murmurs, rub, S3 or S4. Abdomen: Soft, non-distended.  Extremities: No clubbing, cyanosis or edema. DP/PT/Radials 2+ and equal bilaterally. Right radial site well healed. Psych: Normal affect. Neuro: Alert and oriented X 3. Moves all extremities spontaneously.   Diagnostics Cardiac Catheterization and Percutaneous Coronary Intervention 07.31.2014  Hemodynamic Findings:  Central aortic pressure: 166/86  Left ventricular pressure: 166/18/25  Angiographic Findings:  Left main: No obstructive disease.  Left Anterior Descending Artery: Large caliber vessel that courses to the apex. The mid vessel has diffuse 50% stenosis. There are several diagonal branches. The first diagonal branch is moderate in caliber with proximal 50% stenosis. The second and third diagonal branches are small in caliber with mild proximal disease.  Circumflex Artery: 100% proximal occlusion after a very early moderate caliber OM branch. The first OM branch has diffuse 30% stenosis. After PCI, the mid AV groove Circumflex is seen to fill three OM branches. The AV groove Circumflex has diffuse 80-90% stenosis between the takeoff of the second and third OM branches with same appearance as the cath in 2008.  **The LCX was successfully stented using a 2.75 x 16 mm Promus Premier DES**  Right Coronary Artery: Large caliber dominant vessel with hazy 90% mid stenosis with appearance of unstable plaque.  **The RCA was successfully stented using a 3.0 x 16 mm Promus Premier DES**  Left Ventricular Angiogram: LVEF=50-55%.    Assessment and Plan 1. CAD s/p recent NSTEMI and PCI  - stable without anginal symptoms  - continue medical therapy   - counseled regarding importance of medication compliance and secondary prevention and lifestyle modifications - low fat, low cholesterol diet and exercise  - return for follow-up with Dr. Elease Hashimoto in 4-6  weeks 2. Preserved LV function  Signed, Alaura Schippers, PA-C 10/25/2012, 1:20 PM

## 2012-10-25 NOTE — Patient Instructions (Addendum)
Your physician recommends that you schedule a follow-up appointment in: 4-6 weeks with DR.NAHSER per Nehemiah Settle EDMISTEN POST PCI  Your physician recommends that you continue on your current medications as directed. Please refer to the Current Medication list given to you today.

## 2012-10-29 ENCOUNTER — Ambulatory Visit: Payer: Medicare Other | Admitting: Oncology

## 2012-11-11 DIAGNOSIS — E7800 Pure hypercholesterolemia, unspecified: Secondary | ICD-10-CM | POA: Diagnosis not present

## 2012-11-11 DIAGNOSIS — I2510 Atherosclerotic heart disease of native coronary artery without angina pectoris: Secondary | ICD-10-CM | POA: Diagnosis not present

## 2012-11-22 ENCOUNTER — Encounter: Payer: Self-pay | Admitting: Nurse Practitioner

## 2012-11-22 ENCOUNTER — Ambulatory Visit (INDEPENDENT_AMBULATORY_CARE_PROVIDER_SITE_OTHER): Payer: Medicare Other | Admitting: Nurse Practitioner

## 2012-11-22 VITALS — BP 120/70 | HR 44 | Ht 71.5 in | Wt 212.1 lb

## 2012-11-22 DIAGNOSIS — I214 Non-ST elevation (NSTEMI) myocardial infarction: Secondary | ICD-10-CM

## 2012-11-22 LAB — BASIC METABOLIC PANEL
BUN: 12 mg/dL (ref 6–23)
CO2: 31 mEq/L (ref 19–32)
Calcium: 8.9 mg/dL (ref 8.4–10.5)
Chloride: 102 mEq/L (ref 96–112)
Creatinine, Ser: 0.9 mg/dL (ref 0.4–1.5)
GFR: 93.55 mL/min (ref >60.00)
Glucose, Bld: 77 mg/dL (ref 70–99)
Potassium: 4 mEq/L (ref 3.5–5.1)
Sodium: 137 mEq/L (ref 135–145)

## 2012-11-22 LAB — CBC WITH DIFFERENTIAL/PLATELET
Basophils Absolute: 0 10*3/uL (ref 0.0–0.1)
Basophils Relative: 0.3 % (ref 0.0–3.0)
Eosinophils Absolute: 0.2 10*3/uL (ref 0.0–0.7)
Eosinophils Relative: 2.4 % (ref 0.0–5.0)
HCT: 43.6 % (ref 39.0–52.0)
Hemoglobin: 14.5 g/dL (ref 13.0–17.0)
Lymphocytes Relative: 13.4 % (ref 12.0–46.0)
Lymphs Abs: 0.8 10*3/uL (ref 0.7–4.0)
MCHC: 33.2 g/dL (ref 30.0–36.0)
MCV: 90.1 fl (ref 78.0–100.0)
Monocytes Absolute: 0.5 10*3/uL (ref 0.1–1.0)
Monocytes Relative: 8.8 % (ref 3.0–12.0)
Neutro Abs: 4.6 10*3/uL (ref 1.4–7.7)
Neutrophils Relative %: 75.1 % (ref 43.0–77.0)
Platelets: 141 10*3/uL — ABNORMAL LOW (ref 150.0–400.0)
RBC: 4.84 Mil/uL (ref 4.22–5.81)
RDW: 13.5 % (ref 11.5–14.6)
WBC: 6.2 10*3/uL (ref 4.5–10.5)

## 2012-11-22 NOTE — Progress Notes (Addendum)
Canary Brim Date of Birth: 1945/07/23 Medical Record #[id number]  History of Present Illness: [name] is seen back today for a post hospital visit. Seen for Dr. Elease [name]. Has CAD with known disease and prior LCX occlusion per cath in 2001 - follow up cath in 2008 showed recanalization of the lCX and otherwise nonobstructive disease, PAF, anxiety, mobitz type 1 second degree AV block - not on beta blocker therapy. Has also had lung cancer in the past treated with surgery and XRT.   Has had recent NSTEMI and had PCI/DES of the RCA and LCX. EF is 50 to 55% per cath. Was noted to have PAF during this admission - not currently on anticoagulation in the setting of dual antiplatelet therapy but this need to be addressed in the future. His CHADSVASc = 2.   Comes in today. Here with his wife. He is doing ok. Has been home about 5 weeks. Had a little chest discomfort initially - none over the past 3 weeks - no NTG use. Feels ok. Mostly limited by his breathing due to his lung cancer history/treatment. No bleeding or bruising. Not fasting today.   Current Outpatient Prescriptions  Medication Sig Dispense Refill  . aspirin EC 81 MG EC tablet Take 1 tablet (81 mg total) by mouth daily.      [name] atorvastatin (LIPITOR) 80 MG tablet Take 1 tablet (80 mg total) by mouth daily at 6 PM.  30 tablet  6  . clonazePAM (KLONOPIN) 1 MG tablet Take 1 mg by mouth 3 (three) times daily.       . nitroGLYCERIN (NITROSTAT) 0.4 MG SL tablet Place 1 tablet (0.4 mg total) under the tongue every 5 (five) minutes x 3 doses as needed for chest pain.  25 tablet  3  . prasugrel (EFFIENT) 10 MG TABS Take 1 tablet (10 mg total) by mouth daily.  30 tablet  6   No current facility-administered medications for this visit.    Allergies  Allergen Reactions  . Codeine Other (See Comments)    Dizziness, stomach pain    Past Medical History  Diagnosis Date  . Adenopathy     RIGHT ADRENAL  . Anxiety   . Arthritis    KNEES/HANDS  . Hx of melanoma of skin   . PAF (paroxysmal atrial fibrillation)     a. not currently on anticoagulation (09/2012)  . Non-small cell lung cancer     a. s/p resection and XRT.  [name] CAD (coronary artery disease)     a. Prior known CTO of LCX with recanalizationby cath 2008;  b. 09/2012 NSTEMI/Cath/PCI: LM nl, LAD 49m, D1 50p, LCX 100p (2.75x16 Promus Premier DES), 80-66m, OM1 30, RCA 20m (3.0x16 Promus Premier DES), EF 50-55%.    Past Surgical History  Procedure Laterality Date  . Lobectomy left upper lobe  08/15/2001  . Adrenalextomy  02/2000  . Right vats, mini thoracotomy, wedge resection of right upper  01/18/2004  . Cardiac catheterization  2008    LAD 40, D1 OK, D2 50, CFX 90, RCA OK, EF 60%; Consider PCI PRN but the stenosis is quite complex and we would end up jailing several large posterolateral branches      History  Smoking status  . Former Smoker -- 40 years  . Quit date: 08/15/2001  Smokeless tobacco  . Not on file    History  Alcohol Use No    Family History  Problem Relation Age of Onset  . Heart attack Father  Review of Systems: The review of systems is per the HPI.  All other systems were reviewed and are negative.  Physical Exam: BP 120/70  Pulse 44  Ht 5' 11.5" (1.816 m)  Wt 212 lb 1.9 oz (96.217 kg)  BMI 29.18 kg/m2 Patient is very pleasant and in no acute distress. Skin is warm and dry. Color is normal.  HEENT is unremarkable. Normocephalic/atraumatic. PERRL. Sclera are nonicteric. Neck is supple. No masses. No JVD. Lungs are clear. Cardiac exam shows an irregular rate and rhythm. Rate is slow. Abdomen is soft. Extremities are without edema. Gait and ROM are intact. No gross neurologic deficits noted.  LABORATORY DATA: CBC/BMET and EKG pending  Lab Results  Component Value Date   WBC 8.4 10/17/2012   HGB 14.5 10/17/2012   HCT 39.2 10/17/2012   PLT 121* 10/17/2012   GLUCOSE 100* 10/18/2012   CHOL 165 10/17/2012   TRIG 80 10/17/2012    HDL 40 10/17/2012   LDLCALC 109* 10/17/2012   ALT 17 10/17/2012   AST 67* 10/17/2012   NA 139 10/18/2012   K 4.2 10/18/2012   CL 103 10/18/2012   CREATININE 0.91 10/18/2012   BUN 10 10/18/2012   CO2 23 10/18/2012   TSH 0.774 10/16/2012   INR 1.08 10/16/2012   HGBA1C 5.5 10/16/2012    Lab Results  Component Value Date   TROPONINI 15.20* 10/17/2012    Angiographic Findings:   Left main: No obstructive disease.  Left Anterior Descending Artery: Large caliber vessel that courses to the apex. The mid vessel has diffuse 50% stenosis. There are several diagonal branches. The first diagonal branch is moderate in caliber with proximal 50% stenosis. The second and third diagonal branches are small in caliber with mild proximal disease.  Circumflex Artery: 100% proximal occlusion after a very early moderate caliber OM branch. The first OM branch has diffuse 30% stenosis. After PCI, the mid AV groove Circumflex is seen to fill three OM branches. The AV groove Circumflex has diffuse 80-90% stenosis between the takeoff of the second and third OM branches with same appearance as the cath in 2008.  Right Coronary Artery: Large caliber dominant vessel with hazy 90% mid stenosis with appearance of unstable plaque.   Left Ventricular Angiogram: LVEF=50-55%.   Impression:  1. Triple vessel CAD with NSTEMI secondary to acute occlusion of Circumflex.  2. Severe stenosis, unstable plaque mid RCA  3. NSTEMI  4. Preserved LV systolic function  5. Successful PTCA/DES x 1 mid RCA  6. Successful PTCA/DES x 1 proximal Circumflex   Recommendations: He will need dual anti-platelet therapy with ASA and Effient for one year. Continue statin. No beta blocker with heart block.   Complications: None. The patient tolerated the procedure well.    Assessment / Plan: 1. NSTEMI - not on beta blocker due to Mobitz Tyle 1 second degree AV block - s/p DES to the mid RCA and proximal LCX - committed to DAPT for one year. Doing well  clinically.   2. PAF - not on warfarin or like agent due to his DAPT - this will need to be addressed in the future. CHADs is 0 and CHADSVASc = 2. He has a 2.2% annual stroke risk.  3. HLD - on statin therapy - not fasting today - recheck on return visit.   4. Chronic dyspnea from lung cancer treatment - unchanged.  5. 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  Overall, he looks good. Samples of Effient are given today. Will check baseline labs. See him back in about 6 weeks with fasting labs.   Patient is agreeable to this plan and will call if any problems develop in the interim.   Rosalio Macadamia, RN, ANP-C [facility] [address] [place], [place]  [address]   Addendum:  EKG checked today - remains in sinus brady - rate of 41. No evidence of Mobitz 1 today - tracings are reviewed with Dr. Elease [name]. Patient remains asymptomatic. Not on any rate slowing medicines. Will continue to follow.

## 2012-11-22 NOTE — Patient Instructions (Addendum)
See Dr. Elease Hashimoto in 6 weeks with fasting labs  Stay as active as you can  We are checking labs today and an EKG today  Call the [facility] office at [phone] if you have any questions, problems or concerns.

## 2012-11-29 ENCOUNTER — Ambulatory Visit: Payer: Medicare Other | Admitting: Oncology

## 2012-12-02 ENCOUNTER — Telehealth: Payer: Self-pay | Admitting: Cardiovascular Disease

## 2012-12-02 DIAGNOSIS — I2510 Atherosclerotic heart disease of native coronary artery without angina pectoris: Secondary | ICD-10-CM

## 2012-12-02 DIAGNOSIS — E785 Hyperlipidemia, unspecified: Secondary | ICD-10-CM

## 2012-12-02 NOTE — Telephone Encounter (Signed)
New problem   Pt has an appt 12/31/12 and pt want to go to beach and was wondering if he could wait til November to come in. Please call pt.

## 2012-12-02 NOTE — Telephone Encounter (Signed)
Pt was called and new lab and ov app given

## 2012-12-03 DIAGNOSIS — Z23 Encounter for immunization: Secondary | ICD-10-CM | POA: Diagnosis not present

## 2012-12-11 ENCOUNTER — Telehealth: Payer: Self-pay | Admitting: Nurse Practitioner

## 2012-12-11 NOTE — Telephone Encounter (Signed)
Medications samples// transferred call

## 2012-12-31 ENCOUNTER — Ambulatory Visit: Payer: Medicare Other | Admitting: Cardiovascular Disease

## 2013-01-20 ENCOUNTER — Ambulatory Visit (INDEPENDENT_AMBULATORY_CARE_PROVIDER_SITE_OTHER): Payer: Medicare Other | Admitting: Cardiovascular Disease

## 2013-01-20 ENCOUNTER — Encounter: Payer: Self-pay | Admitting: Cardiovascular Disease

## 2013-01-20 ENCOUNTER — Other Ambulatory Visit: Payer: Medicare Other

## 2013-01-20 VITALS — BP 122/78 | HR 50 | Ht 71.0 in | Wt 211.0 lb

## 2013-01-20 DIAGNOSIS — E785 Hyperlipidemia, unspecified: Secondary | ICD-10-CM

## 2013-01-20 DIAGNOSIS — I2510 Atherosclerotic heart disease of native coronary artery without angina pectoris: Secondary | ICD-10-CM

## 2013-01-20 LAB — LIPID PANEL
LDL Cholesterol: 41 mg/dL (ref 0–99)
Total CHOL/HDL Ratio: 2
Triglycerides: 54 mg/dL (ref 0.0–149.0)
VLDL: 10.8 mg/dL (ref 0.0–40.0)

## 2013-01-20 LAB — HEPATIC FUNCTION PANEL
Albumin: 4 g/dL (ref 3.5–5.2)
Alkaline Phosphatase: 76 U/L (ref 39–117)
Bilirubin, Direct: 0.1 mg/dL (ref 0.0–0.3)

## 2013-01-20 LAB — BASIC METABOLIC PANEL
BUN: 18 mg/dL (ref 6–23)
CO2: 30 mEq/L (ref 19–32)
Chloride: 105 mEq/L (ref 96–112)
Creatinine, Ser: 1 mg/dL (ref 0.4–1.5)

## 2013-01-20 NOTE — Assessment & Plan Note (Addendum)
Benjamin [name] is doing OK.  He's not had any further episodes of chest pain. He is somewhat limited in his exertional capacity because of his lung surgeries and history of lung cancer.  His is on Effient and ASA.  His 30 day voucher for Effient  was not valid for some reason.  Will give him another one.   I've encouraged him to exercise as much as possible. We'll check fasting lipids, liver enzymes, and basic metabolic profile today. I seen again in 6 months for an office visit, EKG, and fasting labs.

## 2013-01-20 NOTE — Progress Notes (Signed)
Canary Brim Date of Birth: 1946-02-07 Medical Record #[id number]   Problem List: 1. CAD - DES to RCA and prox LCx.  (10/16/12) 2. Paroxysmal Atrial fib: 3.   History of Present Illness: [name] is seen back today for a post hospital visit. Seen for Dr. Elease Hashimoto. Has CAD with known disease and prior LCX occlusion per cath in 2001 - follow up cath in 2008 showed recanalization of the lCX and otherwise nonobstructive disease, PAF, anxiety, mobitz type 1 second degree AV block - not on beta blocker therapy. Has also had lung cancer in the past treated with surgery and XRT.   Has had recent NSTEMI and had PCI/DES of the RCA and LCX. EF is 50 to 55% per cath. Was noted to have PAF during this admission - not currently on anticoagulation in the setting of dual antiplatelet therapy but this need to be addressed in the future. His CHADSVASc = 2.   Comes in today. Here with his wife. He is doing ok. Has been home about 5 weeks. Had a little chest discomfort initially - none over the past 3 weeks - no NTG use. Feels ok. Mostly limited by his breathing due to his lung cancer history/treatment. No bleeding or bruising. Not fasting today.   Nov. 3, 2014:    Current Outpatient Prescriptions  Medication Sig Dispense Refill  . aspirin EC 81 MG EC tablet Take 1 tablet (81 mg total) by mouth daily.      [name] atorvastatin (LIPITOR) 80 MG tablet Take 1 tablet (80 mg total) by mouth daily at 6 PM.  30 tablet  6  . clonazePAM (KLONOPIN) 1 MG tablet Take 1 mg by mouth 3 (three) times daily.       . nitroGLYCERIN (NITROSTAT) 0.4 MG SL tablet Place 1 tablet (0.4 mg total) under the tongue every 5 (five) minutes x 3 doses as needed for chest pain.  25 tablet  3  . prasugrel (EFFIENT) 10 MG TABS Take 1 tablet (10 mg total) by mouth daily.  30 tablet  6   No current facility-administered medications for this visit.    Allergies  Allergen Reactions  . Codeine Other (See Comments)    Dizziness, stomach pain     Past Medical History  Diagnosis Date  . Adenopathy     RIGHT ADRENAL  . Anxiety   . Arthritis     KNEES/HANDS  . Hx of melanoma of skin   . PAF (paroxysmal atrial fibrillation)     a. not currently on anticoagulation (09/2012)  . Non-small cell lung cancer     a. s/p resection and XRT.  [name] CAD (coronary artery disease)     a. Prior known CTO of LCX with recanalizationby cath 2008;  b. 09/2012 NSTEMI/Cath/PCI: LM nl, LAD 51m, D1 50p, LCX 100p (2.75x16 Promus Premier DES), 80-80m, OM1 30, RCA 48m (3.0x16 Promus Premier DES), EF 50-55%.    Past Surgical History  Procedure Laterality Date  . Lobectomy left upper lobe  08/15/2001  . Adrenalextomy  02/2000  . Right vats, mini thoracotomy, wedge resection of right upper  01/18/2004  . Cardiac catheterization  2008    LAD 40, D1 OK, D2 50, CFX 90, RCA OK, EF 60%; Consider PCI PRN but the stenosis is quite complex and we would end up jailing several large posterolateral branches      History  Smoking status  . Former Smoker -- 40 years  . Quit date: 08/15/2001  Smokeless tobacco  .  Not on file    History  Alcohol Use No    Family History  Problem Relation Age of Onset  . Heart attack Father     Review of Systems: The review of systems is per the HPI.  All other systems were reviewed and are negative.  Physical Exam: BP 122/78  Pulse 50  Ht 5\' 11"  (1.803 m)  Wt 211 lb (95.709 kg)  BMI 29.44 kg/m2 The patient is alert and oriented x 3.  The mood and affect are normal.  The skin is warm and dry.  Color is normal.   HEENT exam reveals that the sclera are nonicteric.  The mucous membranes are moist.  The carotids are 2+ without bruits.  There is no thyromegaly.  There is no JVD.   Lungs are clear.  The chest wall is non tender.   Cor:   reveals a regular rate with a normal S1 and S2.  There are no murmurs, gallops, or rubs.  The PMI is not displaced.    Abdominal exam reveals good bowel sounds.  There is no guarding or  rebound.  There is no hepatosplenomegaly or tenderness.  There are no masses.  Exam of the legs reveal no clubbing, cyanosis, or edema.  The legs are without rashes.  The distal pulses are intact.   Cranial nerves II - XII are intact.  Motor and sensory functions are intact.  The gait is normal.   LABORATORY DATA: CBC/BMET and EKG pending  Lab Results  Component Value Date   WBC 6.2 11/22/2012   HGB 14.5 11/22/2012   HCT 43.6 11/22/2012   PLT 141.0* 11/22/2012   GLUCOSE 77 11/22/2012   CHOL 165 10/17/2012   TRIG 80 10/17/2012   HDL 40 10/17/2012   LDLCALC 109* 10/17/2012   ALT 17 10/17/2012   AST 67* 10/17/2012   NA 137 11/22/2012   K 4.0 11/22/2012   CL 102 11/22/2012   CREATININE 0.9 11/22/2012   BUN 12 11/22/2012   CO2 31 11/22/2012   TSH 0.774 10/16/2012   INR 1.08 10/16/2012   HGBA1C 5.5 10/16/2012    Lab Results  Component Value Date   TROPONINI 15.20* 10/17/2012    Angiographic Findings:   Left main: No obstructive disease.  Left Anterior Descending Artery: Large caliber vessel that courses to the apex. The mid vessel has diffuse 50% stenosis. There are several diagonal branches. The first diagonal branch is moderate in caliber with proximal 50% stenosis. The second and third diagonal branches are small in caliber with mild proximal disease.  Circumflex Artery: 100% proximal occlusion after a very early moderate caliber OM branch. The first OM branch has diffuse 30% stenosis. After PCI, the mid AV groove Circumflex is seen to fill three OM branches. The AV groove Circumflex has diffuse 80-90% stenosis between the takeoff of the second and third OM branches with same appearance as the cath in 2008.  Right Coronary Artery: Large caliber dominant vessel with hazy 90% mid stenosis with appearance of unstable plaque.   Left Ventricular Angiogram: LVEF=50-55%.   Impression:  1. Triple vessel CAD with NSTEMI secondary to acute occlusion of Circumflex.  2. Severe stenosis, unstable plaque mid RCA  3.  NSTEMI  4. Preserved LV systolic function  5. Successful PTCA/DES x 1 mid RCA  6. Successful PTCA/DES x 1 proximal Circumflex   Recommendations: He will need dual anti-platelet therapy with ASA and Effient for one year. Continue statin. No beta blocker with heart block.  Complications: None. The patient tolerated the procedure well.    Assessment / Plan:

## 2013-01-20 NOTE — Patient Instructions (Addendum)
Your physician recommends that you return for lab work in: today bmet lipid liver  Your physician wants you to follow-up in: 6 months with ekg You will receive a reminder letter in the mail two months in advance. If you don't receive a letter, please call our office to schedule the follow-up appointment.

## 2013-01-21 ENCOUNTER — Telehealth: Payer: Self-pay | Admitting: Cardiovascular Disease

## 2013-01-21 NOTE — Telephone Encounter (Signed)
Returned call with lab results 

## 2013-01-21 NOTE — Telephone Encounter (Signed)
New message    Returned Dr Harvie Bridge nurse call from this am

## 2013-02-12 ENCOUNTER — Telehealth: Payer: Self-pay | Admitting: *Deleted

## 2013-02-12 NOTE — Telephone Encounter (Signed)
Patient requests samples of effient. Patient aware that they are at the front desk for pick up.

## 2013-02-19 DIAGNOSIS — D1801 Hemangioma of skin and subcutaneous tissue: Secondary | ICD-10-CM | POA: Diagnosis not present

## 2013-02-19 DIAGNOSIS — D235 Other benign neoplasm of skin of trunk: Secondary | ICD-10-CM | POA: Diagnosis not present

## 2013-02-19 DIAGNOSIS — Z85820 Personal history of malignant melanoma of skin: Secondary | ICD-10-CM | POA: Diagnosis not present

## 2013-02-19 DIAGNOSIS — L819 Disorder of pigmentation, unspecified: Secondary | ICD-10-CM | POA: Diagnosis not present

## 2013-03-03 DIAGNOSIS — F411 Generalized anxiety disorder: Secondary | ICD-10-CM | POA: Diagnosis not present

## 2013-03-28 ENCOUNTER — Other Ambulatory Visit: Payer: Self-pay

## 2013-03-28 NOTE — Telephone Encounter (Signed)
Patient called for samples of effient

## 2013-04-10 ENCOUNTER — Telehealth: Payer: Self-pay | Admitting: Oncology

## 2013-04-10 NOTE — Telephone Encounter (Signed)
Pt called and r/s MD visit to May 2015, nurse notified

## 2013-04-11 ENCOUNTER — Ambulatory Visit: Payer: Medicare Other | Admitting: Oncology

## 2013-04-21 DIAGNOSIS — E7800 Pure hypercholesterolemia, unspecified: Secondary | ICD-10-CM | POA: Diagnosis not present

## 2013-04-21 DIAGNOSIS — Z79899 Other long term (current) drug therapy: Secondary | ICD-10-CM | POA: Diagnosis not present

## 2013-04-21 DIAGNOSIS — Z0000 Encounter for general adult medical examination without abnormal findings: Secondary | ICD-10-CM | POA: Diagnosis not present

## 2013-04-21 DIAGNOSIS — IMO0001 Reserved for inherently not codable concepts without codable children: Secondary | ICD-10-CM | POA: Diagnosis not present

## 2013-04-21 DIAGNOSIS — R350 Frequency of micturition: Secondary | ICD-10-CM | POA: Diagnosis not present

## 2013-04-21 DIAGNOSIS — Z1331 Encounter for screening for depression: Secondary | ICD-10-CM | POA: Diagnosis not present

## 2013-05-16 ENCOUNTER — Telehealth: Payer: Self-pay

## 2013-05-16 NOTE — Telephone Encounter (Signed)
Patient called wanting samples of effient placed up front

## 2013-06-02 ENCOUNTER — Other Ambulatory Visit: Payer: Self-pay | Admitting: *Deleted

## 2013-06-02 DIAGNOSIS — C3490 Malignant neoplasm of unspecified part of unspecified bronchus or lung: Secondary | ICD-10-CM

## 2013-06-26 ENCOUNTER — Telehealth: Payer: Self-pay

## 2013-06-26 NOTE — Telephone Encounter (Signed)
Patient called for samples of effient placed samples up front

## 2013-07-15 ENCOUNTER — Encounter: Payer: Self-pay | Admitting: Thoracic Surgery (Cardiothoracic Vascular Surgery)

## 2013-07-15 ENCOUNTER — Ambulatory Visit (INDEPENDENT_AMBULATORY_CARE_PROVIDER_SITE_OTHER): Payer: Medicare Other | Admitting: Thoracic Surgery (Cardiothoracic Vascular Surgery)

## 2013-07-15 ENCOUNTER — Ambulatory Visit
Admission: RE | Admit: 2013-07-15 | Discharge: 2013-07-15 | Disposition: A | Discharge status: Home / Self Care | Payer: Medicare Other | Source: Ambulatory Visit | Attending: Thoracic Surgery (Cardiothoracic Vascular Surgery) | Admitting: Thoracic Surgery (Cardiothoracic Vascular Surgery)

## 2013-07-15 VITALS — BP 161/78 | HR 45 | Resp 16 | Ht 71.0 in | Wt 212.0 lb

## 2013-07-15 DIAGNOSIS — C3490 Malignant neoplasm of unspecified part of unspecified bronchus or lung: Secondary | ICD-10-CM

## 2013-07-15 DIAGNOSIS — Z09 Encounter for follow-up examination after completed treatment for conditions other than malignant neoplasm: Secondary | ICD-10-CM | POA: Diagnosis not present

## 2013-07-15 DIAGNOSIS — J984 Other disorders of lung: Secondary | ICD-10-CM | POA: Diagnosis not present

## 2013-07-15 DIAGNOSIS — Z85118 Personal history of other malignant neoplasm of bronchus and lung: Secondary | ICD-10-CM | POA: Diagnosis not present

## 2013-07-15 NOTE — Progress Notes (Signed)
HPI:  [name] returns today for a scheduled one year followup visit. He is a 68 year old gentleman with a history of multiple lung cancers. 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 He had a right upper lobectomy in 2005 per second non-small cell. He then developed a third non-small cell cancer in the left lower lobe and 2009. That was treated with radiation.  He was last in the office a year ago which time he had chronic changes on the left and radiation and a groundglass opacity in the right lung which was unchanged. In the interim since his last visit he had an MI in July and was treated with angioplasty and stenting. He was started on Lipitor which was then stopped and started back at a lower dose. He had numerous questions about that.  He does get short of breath with exertion which is not surprising given his history. He has not had any recent worsening of his shortness of breath. He has not had any problems with wheezing.  Past Medical History  Diagnosis Date  . Adenopathy     RIGHT ADRENAL  . Anxiety   . Arthritis     KNEES/HANDS  . Hx of melanoma of skin   . PAF (paroxysmal atrial fibrillation)     a. not currently on anticoagulation (09/2012)  . Non-small cell lung cancer     a. s/p resection and XRT.  [name] CAD (coronary artery disease)     a. Prior known CTO of LCX with recanalizationby cath 2008;  b. 09/2012 NSTEMI/Cath/PCI: LM nl, LAD 44m, D1 50p, LCX 100p (2.75x16 Promus Premier DES), 80-26m, OM1 30, RCA 70m (3.0x16 Promus Premier DES), EF 50-55%.       Current Outpatient Prescriptions  Medication Sig Dispense Refill  . aspirin EC 81 MG EC tablet Take 1 tablet (81 mg total) by mouth daily.      [name] atorvastatin (LIPITOR) 40 MG tablet Take 40 mg by mouth daily.      . clonazePAM (KLONOPIN) 1 MG tablet Take 1 mg by mouth 3 (three) times daily.       . nitroGLYCERIN (NITROSTAT) 0.4 MG SL tablet Place 1 tablet (0.4 mg total) under the tongue every 5  (five) minutes x 3 doses as needed for chest pain.  25 tablet  3  . prasugrel (EFFIENT) 10 MG TABS Take 1 tablet (10 mg total) by mouth daily.  30 tablet  6   No current facility-administered medications for this visit.    Physical Exam BP 161/78  Pulse 45  Resp 16  Ht 5\' 11"  (1.803 m)  Wt 212 lb (96.163 kg)  BMI 29.58 kg/m2  SpO67 90% 68 year old male in no acute distress Well-developed well-nourished No cervical or suprapubic or adenopathy No carotid bruits Cardiac regular rate and rhythm Lungs diminished breath sounds bilaterally, no wheezing  Diagnostic Tests: CT chest 07/15/2013 IMPRESSION:  1. Stable mass-like fibrosis in the left upper hemi thorax posterior  laterally, dating back to 2011.  2. Stable linear scarring in the upper lung fields from prior  surgical intervention.  3. Stable ground-glass opacity in the right lower lobe.  Electronically Signed  By: Ivar Drape M.D.  On: 07/15/2013 13:22    Impression: 68 year old gentleman with a history of multiple previous lung cancers. Currently has no evidence of disease. He has some chronic scarring due to radiation changes in his left lower lobe. He has a stable groundglass opacity in the right upper  lobe. The groundglass opacity needs to be followed indefinitely.   Plan: Return in one year with a CT of the chest

## 2013-07-30 ENCOUNTER — Encounter: Payer: Self-pay | Admitting: Cardiovascular Disease

## 2013-07-30 ENCOUNTER — Ambulatory Visit (INDEPENDENT_AMBULATORY_CARE_PROVIDER_SITE_OTHER): Payer: Medicare Other | Admitting: Cardiovascular Disease

## 2013-07-30 VITALS — BP 154/80 | HR 43 | Ht 71.0 in | Wt 210.8 lb

## 2013-07-30 DIAGNOSIS — I2510 Atherosclerotic heart disease of native coronary artery without angina pectoris: Secondary | ICD-10-CM

## 2013-07-30 DIAGNOSIS — R079 Chest pain, unspecified: Secondary | ICD-10-CM

## 2013-07-30 DIAGNOSIS — E785 Hyperlipidemia, unspecified: Secondary | ICD-10-CM | POA: Diagnosis not present

## 2013-07-30 LAB — LIPID PANEL
CHOLESTEROL: 107 mg/dL (ref 0–200)
HDL: 46.5 mg/dL (ref >39.00)
LDL Cholesterol: 48 mg/dL (ref 0–99)
Total CHOL/HDL Ratio: 2
Triglycerides: 65 mg/dL (ref 0.0–149.0)
VLDL: 13 mg/dL (ref 0.0–40.0)

## 2013-07-30 LAB — BASIC METABOLIC PANEL
BUN: 12 mg/dL (ref 6–23)
CALCIUM: 8.9 mg/dL (ref 8.4–10.5)
CO2: 30 mEq/L (ref 19–32)
Chloride: 104 mEq/L (ref 96–112)
Creatinine, Ser: 0.9 mg/dL (ref 0.4–1.5)
GFR: 89.75 mL/min (ref >60.00)
GLUCOSE: 101 mg/dL — AB (ref 70–99)
Potassium: 4.1 mEq/L (ref 3.5–5.1)
SODIUM: 140 meq/L (ref 135–145)

## 2013-07-30 LAB — HEPATIC FUNCTION PANEL
ALT: 23 U/L (ref 0–53)
AST: 19 U/L (ref 0–37)
Albumin: 4 g/dL (ref 3.5–5.2)
Alkaline Phosphatase: 63 U/L (ref 39–117)
BILIRUBIN DIRECT: 0.1 mg/dL (ref 0.0–0.3)
BILIRUBIN TOTAL: 0.8 mg/dL (ref 0.2–1.2)
TOTAL PROTEIN: 6.7 g/dL (ref 6.0–8.3)

## 2013-07-30 NOTE — Progress Notes (Signed)
Benjamin [name] Date of Birth: 1945/11/21 Medical Record #[id number]   Problem List: 1. CAD - DES to RCA and prox LCx.  (10/16/12) 2. Paroxysmal Atrial fib: 3. Non-small cell lung ca. 4. Leg edema   History of Present Illness: Benjamin [name] is seen back today for a post hospital visit. Seen for Dr. Acie Fredrickson. Has CAD with known disease and prior LCX occlusion per cath in 2001 - follow up cath in 2008 showed recanalization of the lCX and otherwise nonobstructive disease, PAF, anxiety, mobitz type 1 second degree AV block - not on beta blocker therapy. Has also had lung cancer in the past treated with surgery and XRT.   Has had recent NSTEMI and had PCI/DES of the RCA and LCX. EF is 50 to 55% per cath. Was noted to have PAF during this admission - not currently on anticoagulation in the setting of dual antiplatelet therapy but this need to be addressed in the future. His CHADSVASc = 2.   Comes in today. Here with his wife. He is doing ok. Has been home about 5 weeks. Had a little chest discomfort initially - none over the past 3 weeks - no NTG use. Feels ok. Mostly limited by his breathing due to his lung cancer history/treatment. No bleeding or bruising. Not fasting today.   Nov. 3, 2014:  Jul 30, 2013:  Benjamin [name] is doing ok.  Has had some anxiety . Rare CP. Typically at rest.  Mild arm discomfort with walking .  He has had lots of lung surgeries ( non-small cell CA) and is limited by dyspnea.    Current Outpatient Prescriptions  Medication Sig Dispense Refill  . aspirin EC 81 MG EC tablet Take 1 tablet (81 mg total) by mouth daily.      [name] atorvastatin (LIPITOR) 40 MG tablet Take 40 mg by mouth daily.      . clonazePAM (KLONOPIN) 1 MG tablet Take 1 mg by mouth 3 (three) times daily.       . nitroGLYCERIN (NITROSTAT) 0.4 MG SL tablet Place 1 tablet (0.4 mg total) under the tongue every 5 (five) minutes x 3 doses as needed for chest pain.  25 tablet  3  . prasugrel (EFFIENT) 10 MG TABS Take 1  tablet (10 mg total) by mouth daily.  30 tablet  6   No current facility-administered medications for this visit.    Allergies  Allergen Reactions  . Codeine Other (See Comments)    Dizziness, stomach pain    Past Medical History  Diagnosis Date  . Adenopathy     RIGHT ADRENAL  . Anxiety   . Arthritis     KNEES/HANDS  . Hx of melanoma of skin   . PAF (paroxysmal atrial fibrillation)     a. not currently on anticoagulation (09/2012)  . Non-small cell lung cancer     a. s/p resection and XRT.  [name] CAD (coronary artery disease)     a. Prior known CTO of LCX with recanalizationby cath 2008;  b. 09/2012 NSTEMI/Cath/PCI: LM nl, LAD 16m, D1 50p, LCX 100p (2.75x16 Promus Premier DES), 80-75m, OM1 30, RCA 56m (3.0x16 Promus Premier DES), EF 50-55%.    Past Surgical History  Procedure Laterality Date  . Lobectomy left upper lobe  08/15/2001  . Adrenalextomy  02/2000  . Right vats, mini thoracotomy, wedge resection of right upper  01/18/2004  . Cardiac catheterization  2008    LAD 40, D1 OK, D2 50, CFX 90, RCA  OK, EF 60%; Consider PCI PRN but the stenosis is quite complex and we would end up jailing several large posterolateral branches      History  Smoking status  . Former Smoker -- 64 years  . Quit date: 08/15/2001  Smokeless tobacco  . Not on file    History  Alcohol Use No    Family History  Problem Relation Age of Onset  . Heart attack Father     Review of Systems: The review of systems is per the HPI.  All other systems were reviewed and are negative.  Physical Exam: BP 154/80  Pulse 43  Ht 5\' 11"  (1.803 m)  Wt 210 lb 12.8 oz (95.618 kg)  BMI 29.41 kg/m2 The patient is alert and oriented x 3.  The mood and affect are normal.  The skin is warm and dry.  Color is normal.   HEENT exam reveals that the sclera are nonicteric.  The mucous membranes are moist.  The carotids are 2+ without bruits.  There is no thyromegaly.  There is no JVD.   Lungs are clear.  The  chest wall is non tender.   Cor:   reveals a regular rate with a normal S1 and S2.  There are no murmurs, gallops, or rubs.  The PMI is not displaced.    Abdominal exam reveals good bowel sounds.  There is no guarding or rebound.  There is no hepatosplenomegaly or tenderness.  There are no masses.  Exam of the legs reveal no clubbing, cyanosis, 1 + edema.  The legs are without rashes.  The distal pulses are intact.   Cranial nerves II - XII are intact.  Motor and sensory functions are intact.  The gait is normal.   LABORATORY DATA: CBC/BMET and EKG pending  Lab Results  Component Value Date   WBC 6.2 11/22/2012   HGB 14.5 11/22/2012   HCT 43.6 11/22/2012   PLT 141.0* 11/22/2012   GLUCOSE 98 01/20/2013   CHOL 96 01/20/2013   TRIG 54.0 01/20/2013   HDL 44.20 01/20/2013   LDLCALC 41 01/20/2013   ALT 26 01/20/2013   AST 21 01/20/2013   NA 140 01/20/2013   K 4.1 01/20/2013   CL 105 01/20/2013   CREATININE 1.0 01/20/2013   BUN 18 01/20/2013   CO2 30 01/20/2013   TSH 0.774 10/16/2012   INR 1.08 10/16/2012   HGBA1C 5.5 10/16/2012    Lab Results  Component Value Date   TROPONINI 15.20* 10/17/2012    Angiographic Findings:  10/16/12  Left main: No obstructive disease.  Left Anterior Descending Artery: Large caliber vessel that courses to the apex. The mid vessel has diffuse 50% stenosis. There are several diagonal branches. The first diagonal branch is moderate in caliber with proximal 50% stenosis. The second and third diagonal branches are small in caliber with mild proximal disease.  Circumflex Artery: 100% proximal occlusion after a very early moderate caliber OM branch. The first OM branch has diffuse 30% stenosis. After PCI, the mid AV groove Circumflex is seen to fill three OM branches. The AV groove Circumflex has diffuse 80-90% stenosis between the takeoff of the second and third OM branches with same appearance as the cath in 2008.  Right Coronary Artery: Large caliber dominant vessel with hazy 90%  mid stenosis with appearance of unstable plaque.   Left Ventricular Angiogram: LVEF=50-55%.   Impression:  1. Triple vessel CAD with NSTEMI secondary to acute occlusion of Circumflex.  2. Severe stenosis, unstable  plaque mid RCA  3. NSTEMI  4. Preserved LV systolic function  5. Successful PTCA/DES x 1 mid RCA  6. Successful PTCA/DES x 1 proximal Circumflex   Recommendations: He will need dual anti-platelet therapy with ASA and Effient for one year. Continue statin. No beta blocker with heart block.   Complications: None. The patient tolerated the procedure well.    Assessment / Plan:

## 2013-07-30 NOTE — Patient Instructions (Signed)
Your physician has requested that you have a lexiscan myoview. For further information please visit [email or web address]. Please follow instruction sheet, as given.  Your physician recommends that you have lab work in: TODAY and in 6 months  Your physician wants you to follow-up in: 6 months with Dr. Acie Fredrickson. You will receive a reminder letter in the mail two months in advance. If you don't receive a letter, please call our office to schedule the follow-up appointment. You will need to get fasting lab work on or before the day of your office visit with Dr. Acie Fredrickson.  Do not eat or drink after midnight except water on the day of this appointment.

## 2013-07-30 NOTE — Assessment & Plan Note (Signed)
He has a history PCI in the past and had PTCA of his left circumflex artery and right coronary artery in July, 2014.  He has some exertional chest discomfort and arm pain. We'll schedule him for a lexiscan Myoview .  I will see him in  6 months for OV and fasting labs.  He needs fasting labs today.

## 2013-07-31 NOTE — Progress Notes (Signed)
Quick Note:  Preliminary report reviewed by triage nurse and sent to MD desk. ______ 

## 2013-08-04 ENCOUNTER — Telehealth: Payer: Self-pay | Admitting: Cardiovascular Disease

## 2013-08-04 NOTE — Telephone Encounter (Signed)
Reviewed lab results in detail with patient who called to get specific results.  Patient verbalized understanding and gratitude

## 2013-08-04 NOTE — Telephone Encounter (Signed)
Patient would like to speak with you regarding his blood work. He got your message but has more questions. Please call back.

## 2013-08-08 ENCOUNTER — Telehealth: Payer: Self-pay | Admitting: Oncology

## 2013-08-08 NOTE — Telephone Encounter (Signed)
pt called and r/s 5/26 appt to 7/9. pt has new d/t.

## 2013-08-12 ENCOUNTER — Ambulatory Visit: Payer: Medicare Other | Admitting: Oncology

## 2013-08-13 ENCOUNTER — Ambulatory Visit (HOSPITAL_COMMUNITY): Payer: Medicare Other | Attending: Cardiovascular Disease | Admitting: Radiology

## 2013-08-13 VITALS — BP 136/62 | HR 39 | Ht 71.0 in | Wt 206.0 lb

## 2013-08-13 DIAGNOSIS — R0989 Other specified symptoms and signs involving the circulatory and respiratory systems: Secondary | ICD-10-CM | POA: Insufficient documentation

## 2013-08-13 DIAGNOSIS — R5383 Other fatigue: Secondary | ICD-10-CM | POA: Diagnosis not present

## 2013-08-13 DIAGNOSIS — R0602 Shortness of breath: Secondary | ICD-10-CM | POA: Diagnosis not present

## 2013-08-13 DIAGNOSIS — R5381 Other malaise: Secondary | ICD-10-CM | POA: Insufficient documentation

## 2013-08-13 DIAGNOSIS — R079 Chest pain, unspecified: Secondary | ICD-10-CM | POA: Insufficient documentation

## 2013-08-13 DIAGNOSIS — I4891 Unspecified atrial fibrillation: Secondary | ICD-10-CM

## 2013-08-13 DIAGNOSIS — I2510 Atherosclerotic heart disease of native coronary artery without angina pectoris: Secondary | ICD-10-CM

## 2013-08-13 DIAGNOSIS — R0609 Other forms of dyspnea: Secondary | ICD-10-CM | POA: Diagnosis not present

## 2013-08-13 MED ORDER — TECHNETIUM TC 99M SESTAMIBI GENERIC - CARDIOLITE
30.0000 | Freq: Once | INTRAVENOUS | Status: AC | PRN
  Administered 2013-08-13: 30 via INTRAVENOUS

## 2013-08-13 MED ORDER — REGADENOSON 0.4 MG/5ML IV SOLN
0.4000 mg | Freq: Once | INTRAVENOUS | Status: AC
  Administered 2013-08-13: 0.4 mg via INTRAVENOUS

## 2013-08-13 MED ORDER — TECHNETIUM TC 99M SESTAMIBI GENERIC - CARDIOLITE
10.0000 | Freq: Once | INTRAVENOUS | Status: AC | PRN
  Administered 2013-08-13: 10 via INTRAVENOUS

## 2013-08-13 NOTE — Progress Notes (Signed)
[facility] [address] [place], [place] [address] [phone]    Cardiology Nuclear Med Study  Benjamin [name] is a 68 y.o. male     MRN : [id number]     DOB: 1945/06/12  Procedure Date: 08/13/2013  Nuclear Med Background Indication for Stress Test:  Evaluation for Ischemia and Stent Patency History:  CAD, MI 2014, Cath, Stent (multi), hx PAF, MPI 2008 EF 62% (scar), COPD Cardiac Risk Factors: Family History - CAD, History of Smoking and Lipids  Symptoms:  Chest Pain (last date of chest discomfort was two weeks ago), DOE, Fatigue and SOB   Nuclear Pre-Procedure Caffeine/Decaff Intake:  None> 12 hrs NPO After: 6:30pm   Lungs:  clear O2 Sat: 97% on room air. IV 0.9% NS with Angio Cath:  20g  IV Site: R Antecubital x 1, tolerated well IV Started by:  Irven Baltimore, RN  Chest Size (in):  44 Cup Size: n/a  Height: 5\' 11"  (1.803 m)  Weight:  206 lb (93.441 kg)  BMI:  Body mass index is 28.74 kg/(m^2). Tech Comments:  N/A    Nuclear Med Study 1 or 2 day study: 1 day  Stress Test Type:  Lexiscan  Reading MD: N/A  Order Authorizing Provider:  Mertie Moores, MD  Resting Radionuclide: Technetium 53m Sestamibi  Resting Radionuclide Dose: 11.0 mCi   Stress Radionuclide:  Technetium 19m Sestamibi  Stress Radionuclide Dose: 33.0 mCi           Stress Protocol Rest HR: 39 Stress HR: 68  Rest BP: 136/62 Stress BP: 131/70  Exercise Time (min): n/a METS: n/a           Dose of Adenosine (mg):  n/a Dose of Lexiscan: 0.4 mg  Dose of Atropine (mg): n/a Dose of Dobutamine: n/a mcg/kg/min (at max HR)  Stress Test Technologist: Glade Lloyd, BS-ES  Nuclear Technologist:  Charlton Amor, CNMT     Rest Procedure:  Myocardial perfusion imaging was performed at rest 45 minutes following the intravenous administration of Technetium 50m Sestamibi. Rest ECG: Atrial Fibrilliation  Stress Procedure:  The patient received IV Lexiscan 0.4 mg over 15-seconds.   Technetium 79m Sestamibi injected at 30-seconds.  Quantitative spect images were obtained after a 45 minute delay.  During the infusion of Lexiscan the patient complained of SOB and fatigue.  These symptoms resolved in recovery.  Stress ECG: No significant change from baseline ECG  QPS Raw Data Images:  There is significant extracardiac activity interfering with the study interpretation.  Stress Images:  There is decreased uptake in the apical lateral, mid and basal inferolateral walls.  Rest Images:  There is decreased uptake in the apical lateral, mid and basal inferolateral walls.  Subtraction (SDS):  There is a medium size, moderate severity, irreversible defect in the basal and mid inferolateral wall and apical lateral wall with posiible mild periinfarct ischemia.  Transient Ischemic Dilatation (Normal <1.22):  1.11 Lung/Heart Ratio (Normal <0.45):  0.23  Quantitative Gated Spect Images QGS EDV:  n/a QGS ESV:  n/a  Impression Exercise Capacity:  Lexiscan with no exercise. BP Response:  Normal blood pressure response. Clinical Symptoms:  There is dyspnea. ECG Impression:  No significant ST segment change suggestive of ischemia. Comparison with Prior Nuclear Study: No significant change from previous study  Overall Impression:  Intermediate risk stress nuclear study with a scar in the basal and mid inferolateral walls and apical lateral wall with possible mild periinfarct ischemia. The interpretantion of  the study is affected by significant extracardiac activity. .  LV Ejection Fraction: Study not gated.  LV Wall Motion:  Study not gated  Dorothy Spark 08/13/2013

## 2013-08-14 DIAGNOSIS — Z23 Encounter for immunization: Secondary | ICD-10-CM | POA: Diagnosis not present

## 2013-08-18 DIAGNOSIS — E7800 Pure hypercholesterolemia, unspecified: Secondary | ICD-10-CM | POA: Diagnosis not present

## 2013-08-20 ENCOUNTER — Encounter: Payer: Self-pay | Admitting: Cardiovascular Disease

## 2013-08-20 ENCOUNTER — Ambulatory Visit (INDEPENDENT_AMBULATORY_CARE_PROVIDER_SITE_OTHER): Payer: Medicare Other | Admitting: Cardiovascular Disease

## 2013-08-20 VITALS — BP 125/80 | HR 52 | Ht 71.0 in | Wt 211.0 lb

## 2013-08-20 DIAGNOSIS — L821 Other seborrheic keratosis: Secondary | ICD-10-CM | POA: Diagnosis not present

## 2013-08-20 DIAGNOSIS — D1801 Hemangioma of skin and subcutaneous tissue: Secondary | ICD-10-CM | POA: Diagnosis not present

## 2013-08-20 DIAGNOSIS — I2510 Atherosclerotic heart disease of native coronary artery without angina pectoris: Secondary | ICD-10-CM | POA: Diagnosis not present

## 2013-08-20 DIAGNOSIS — L819 Disorder of pigmentation, unspecified: Secondary | ICD-10-CM | POA: Diagnosis not present

## 2013-08-20 DIAGNOSIS — Z85820 Personal history of malignant melanoma of skin: Secondary | ICD-10-CM | POA: Diagnosis not present

## 2013-08-20 NOTE — Progress Notes (Signed)
Benjamin [name] Date of Birth: 1945/12/14 Medical Record #[id number]   Problem List: 1. CAD - DES to RCA and prox LCx.  (10/16/12) 2. Paroxysmal Atrial fib: 3. Non-small cell lung ca. 4. Leg edema   History of Present Illness: Benjamin [name] is seen back today for a post hospital visit. Seen for Dr. Acie Fredrickson. Has CAD with known disease and prior LCX occlusion per cath in 2001 - follow up cath in 2008 showed recanalization of the lCX and otherwise nonobstructive disease, PAF, anxiety, mobitz type 1 second degree AV block - not on beta blocker therapy. Has also had lung cancer in the past treated with surgery and XRT.   Has had recent NSTEMI and had PCI/DES of the RCA and LCX. EF is 50 to 55% per cath. Was noted to have PAF during this admission - not currently on anticoagulation in the setting of dual antiplatelet therapy but this need to be addressed in the future. His CHADSVASc = 2.   Comes in today. Here with his wife. He is doing ok. Has been home about 5 weeks. Had a little chest discomfort initially - none over the past 3 weeks - no NTG use. Feels ok. Mostly limited by his breathing due to his lung cancer history/treatment. No bleeding or bruising. Not fasting today.   Nov. 3, 2014:  Jul 30, 2013:  Benjamin [name] is doing ok.  Has had some anxiety . Rare CP. Typically at rest.  Mild arm discomfort with walking .  He has had lots of lung surgeries ( non-small cell CA) and is limited by dyspnea.    August 20, 2013:  Benjamin [name] is doing well.  His left arm pain has improved.  His myoview was intermediate risk. - he has a scar in the Lcx distribution with possible periinfarct ischimia.  He had significant uptake in the bowel which made interpretation of the myoview difficult   He is chronically short of breath from his multiple lung surgeries. He really is not having any symptoms of angina.  Current Outpatient Prescriptions  Medication Sig Dispense Refill  . aspirin EC 81 MG EC tablet Take 1 tablet (81  mg total) by mouth daily.      [name] atorvastatin (LIPITOR) 40 MG tablet Take 40 mg by mouth daily.      . clonazePAM (KLONOPIN) 1 MG tablet Take 1 mg by mouth 3 (three) times daily.       . nitroGLYCERIN (NITROSTAT) 0.4 MG SL tablet Place 1 tablet (0.4 mg total) under the tongue every 5 (five) minutes x 3 doses as needed for chest pain.  25 tablet  3  . prasugrel (EFFIENT) 10 MG TABS Take 1 tablet (10 mg total) by mouth daily.  30 tablet  6   No current facility-administered medications for this visit.    Allergies  Allergen Reactions  . Codeine Other (See Comments)    Dizziness, stomach pain    Past Medical History  Diagnosis Date  . Adenopathy     RIGHT ADRENAL  . Anxiety   . Arthritis     KNEES/HANDS  . Hx of melanoma of skin   . PAF (paroxysmal atrial fibrillation)     a. not currently on anticoagulation (09/2012)  . Non-small cell lung cancer     a. s/p resection and XRT.  [name] CAD (coronary artery disease)     a. Prior known CTO of LCX with recanalizationby cath 2008;  b. 09/2012 NSTEMI/Cath/PCI: LM nl, LAD 78m, D1  50p, LCX 100p (2.75x16 Promus Premier DES), 80-64m, OM1 30, RCA 40m (3.0x16 Promus Premier DES), EF 50-55%.    Past Surgical History  Procedure Laterality Date  . Lobectomy left upper lobe  08/15/2001  . Adrenalextomy  02/2000  . Right vats, mini thoracotomy, wedge resection of right upper  01/18/2004  . Cardiac catheterization  2008    LAD 40, D1 OK, D2 50, CFX 90, RCA OK, EF 60%; Consider PCI PRN but the stenosis is quite complex and we would end up jailing several large posterolateral branches      History  Smoking status  . Former Smoker -- 75 years  . Quit date: 08/15/2001  Smokeless tobacco  . Not on file    History  Alcohol Use No    Family History  Problem Relation Age of Onset  . Heart attack Father     Review of Systems: The review of systems is per the HPI.  All other systems were reviewed and are negative.  Physical Exam: BP 125/80   Pulse 52  Ht 5\' 11"  (1.803 m)  Wt 211 lb (95.709 kg)  BMI 29.44 kg/m2 The patient is alert and oriented x 3.  The mood and affect are normal.  The skin is warm and dry.  Color is normal.   HEENT exam reveals that the sclera are nonicteric.  The mucous membranes are moist.  The carotids are 2+ without bruits.  There is no thyromegaly.  There is no JVD.   Lungs are clear.  The chest wall is non tender.   Cor:   reveals a regular rate with a normal S1 and S2.  There are no murmurs, gallops, or rubs.  The PMI is not displaced.    Abdominal exam reveals good bowel sounds.  There is no guarding or rebound.  There is no hepatosplenomegaly or tenderness.  There are no masses.  Exam of the legs reveal no clubbing, cyanosis, 1 + edema.  The legs are without rashes.  The distal pulses are intact.   Cranial nerves II - XII are intact.  Motor and sensory functions are intact.  The gait is normal.   LABORATORY DATA: CBC/BMET and EKG pending  Lab Results  Component Value Date   WBC 6.2 11/22/2012   HGB 14.5 11/22/2012   HCT 43.6 11/22/2012   PLT 141.0* 11/22/2012   GLUCOSE 101* 07/30/2013   CHOL 107 07/30/2013   TRIG 65.0 07/30/2013   HDL 46.50 07/30/2013   LDLCALC 48 07/30/2013   ALT 23 07/30/2013   AST 19 07/30/2013   NA 140 07/30/2013   K 4.1 07/30/2013   CL 104 07/30/2013   CREATININE 0.9 07/30/2013   BUN 12 07/30/2013   CO2 30 07/30/2013   TSH 0.774 10/16/2012   INR 1.08 10/16/2012   HGBA1C 5.5 10/16/2012    Lab Results  Component Value Date   TROPONINI 15.20* 10/17/2012    Angiographic Findings:  10/16/12  Left main: No obstructive disease.  Left Anterior Descending Artery: Large caliber vessel that courses to the apex. The mid vessel has diffuse 50% stenosis. There are several diagonal branches. The first diagonal branch is moderate in caliber with proximal 50% stenosis. The second and third diagonal branches are small in caliber with mild proximal disease.  Circumflex Artery: 100% proximal occlusion  after a very early moderate caliber OM branch. The first OM branch has diffuse 30% stenosis. After PCI, the mid AV groove Circumflex is seen to fill three OM branches. The  AV groove Circumflex has diffuse 80-90% stenosis between the takeoff of the second and third OM branches with same appearance as the cath in 2008.  Right Coronary Artery: Large caliber dominant vessel with hazy 90% mid stenosis with appearance of unstable plaque.   Left Ventricular Angiogram: LVEF=50-55%.   Impression:  1. Triple vessel CAD with NSTEMI secondary to acute occlusion of Circumflex.  2. Severe stenosis, unstable plaque mid RCA  3. NSTEMI  4. Preserved LV systolic function  5. Successful PTCA/DES x 1 mid RCA  6. Successful PTCA/DES x 1 proximal Circumflex   Recommendations: He will need dual anti-platelet therapy with ASA and Effient for one year. Continue statin. No beta blocker with heart block.   Complications: None. The patient tolerated the procedure well.    Assessment / Plan:

## 2013-08-20 NOTE — Assessment & Plan Note (Addendum)
Benjamin [name] seems to be fairly stable. He's not had any further episodes of angina. He's had several stents placed.   He had an  Intermediate risk stress nuclear study with a scar in the basal and mid inferolateral walls and apical lateral wall with possible mild periinfarct ischemia. The interpretantion of the study is affected by significant extracardiac activity.   He's had a known myocardial infarction in his inferolateral wall.  He is s/pPCI of his LCx and RCA 10/17/13.    At this point I do not think that we need to proceed with cardiac catheterization since his symptoms are so minimal. I would have a low threshold to proceed with cardiac cath if he develops any symptoms.  By now he is extremely limited by his lung disease and from the surgeries for  lung cancer.

## 2013-08-20 NOTE — Patient Instructions (Signed)
Your physician recommends that you continue on your current medications as directed. Please refer to the Current Medication list given to you today.  Your physician recommends that you return for lab work in: 6 months on the day of or a few days before your office visit with Dr. Acie Fredrickson.  You will need to FAST for this appointment - nothing to eat or drink after midnight the night before except water.  Your physician wants you to follow-up in: 6 months with Dr. Acie Fredrickson.  You will receive a reminder letter in the mail two months in advance. If you don't receive a letter, please call our office to schedule the follow-up appointment.

## 2013-09-01 DIAGNOSIS — F411 Generalized anxiety disorder: Secondary | ICD-10-CM | POA: Diagnosis not present

## 2013-09-08 DIAGNOSIS — H9210 Otorrhea, unspecified ear: Secondary | ICD-10-CM | POA: Diagnosis not present

## 2013-09-22 ENCOUNTER — Ambulatory Visit: Payer: Medicare Other | Admitting: Oncology

## 2013-09-22 ENCOUNTER — Telehealth: Payer: Self-pay | Admitting: Oncology

## 2013-09-22 NOTE — Telephone Encounter (Signed)
r/s per pt rqst from 09/21/12 to september nurse notified

## 2013-10-02 ENCOUNTER — Telehealth: Payer: Self-pay | Admitting: *Deleted

## 2013-10-02 NOTE — Telephone Encounter (Signed)
Effient samples provided for patient.

## 2013-11-03 ENCOUNTER — Telehealth: Payer: Self-pay | Admitting: *Deleted

## 2013-11-03 NOTE — Telephone Encounter (Signed)
Effient samples placed at the front desk for pick up.

## 2013-11-04 ENCOUNTER — Emergency Department (HOSPITAL_COMMUNITY)
Admission: EM | Admit: 2013-11-04 | Discharge: 2013-11-04 | Disposition: A | Discharge status: Home / Self Care | Payer: Medicare Other | Attending: Emergency Medicine | Admitting: Emergency Medicine

## 2013-11-04 ENCOUNTER — Encounter (HOSPITAL_COMMUNITY): Payer: Self-pay | Admitting: Emergency Medicine

## 2013-11-04 DIAGNOSIS — Z79899 Other long term (current) drug therapy: Secondary | ICD-10-CM | POA: Diagnosis not present

## 2013-11-04 DIAGNOSIS — Z85820 Personal history of malignant melanoma of skin: Secondary | ICD-10-CM | POA: Insufficient documentation

## 2013-11-04 DIAGNOSIS — I2510 Atherosclerotic heart disease of native coronary artery without angina pectoris: Secondary | ICD-10-CM | POA: Diagnosis not present

## 2013-11-04 DIAGNOSIS — W230XXA Caught, crushed, jammed, or pinched between moving objects, initial encounter: Secondary | ICD-10-CM | POA: Diagnosis not present

## 2013-11-04 DIAGNOSIS — M129 Arthropathy, unspecified: Secondary | ICD-10-CM | POA: Diagnosis not present

## 2013-11-04 DIAGNOSIS — Z85118 Personal history of other malignant neoplasm of bronchus and lung: Secondary | ICD-10-CM | POA: Diagnosis not present

## 2013-11-04 DIAGNOSIS — Y9289 Other specified places as the place of occurrence of the external cause: Secondary | ICD-10-CM | POA: Insufficient documentation

## 2013-11-04 DIAGNOSIS — Z87891 Personal history of nicotine dependence: Secondary | ICD-10-CM | POA: Insufficient documentation

## 2013-11-04 DIAGNOSIS — F411 Generalized anxiety disorder: Secondary | ICD-10-CM | POA: Diagnosis not present

## 2013-11-04 DIAGNOSIS — Y9389 Activity, other specified: Secondary | ICD-10-CM | POA: Insufficient documentation

## 2013-11-04 DIAGNOSIS — S60122A Contusion of left index finger with damage to nail, initial encounter: Secondary | ICD-10-CM

## 2013-11-04 DIAGNOSIS — S6980XA Other specified injuries of unspecified wrist, hand and finger(s), initial encounter: Secondary | ICD-10-CM | POA: Diagnosis not present

## 2013-11-04 DIAGNOSIS — Z7982 Long term (current) use of aspirin: Secondary | ICD-10-CM | POA: Insufficient documentation

## 2013-11-04 DIAGNOSIS — S6000XA Contusion of unspecified finger without damage to nail, initial encounter: Secondary | ICD-10-CM | POA: Insufficient documentation

## 2013-11-04 DIAGNOSIS — Z9889 Other specified postprocedural states: Secondary | ICD-10-CM | POA: Insufficient documentation

## 2013-11-04 DIAGNOSIS — S6990XA Unspecified injury of unspecified wrist, hand and finger(s), initial encounter: Secondary | ICD-10-CM | POA: Diagnosis not present

## 2013-11-04 NOTE — ED Notes (Signed)
Suture cart at bedside per Dr Lowella Fairy order.

## 2013-11-04 NOTE — Discharge Instructions (Signed)
Fingertip Injuries and Amputations Fingertip injuries are common and often get injured because they are last to escape when pulling your hand out of harm's way. You have amputated (cut off) part of your finger. How this turns out depends largely on how much was amputated. If just the tip is amputated, often the end of the finger will grow back and the finger may return to much the same as it was before the injury.  If more of the finger is missing, your caregiver has done the best with the tissue remaining to allow you to keep as much finger as is possible. Your caregiver after checking your injury has tried to leave you with a painless fingertip that has durable, feeling skin. If possible, your caregiver has tried to maintain the finger's length and appearance and preserve its fingernail.  Please read the instructions outlined below and refer to this sheet in the next few weeks. These instructions provide you with general information on caring for yourself. Your caregiver may also give you specific instructions. While your treatment has been done according to the most current medical practices available, unavoidable complications occasionally occur. If you have any problems or questions after discharge, please call your caregiver. HOME CARE INSTRUCTIONS   You may resume normal diet and activities as directed or allowed.  Keep your hand elevated above the level of your heart. This helps decrease pain and swelling.  Keep ice packs (or a bag of ice wrapped in a towel) on the injured area for 15-20 minutes, 03-04 times per day, for the first two days.  Change dressings if necessary or as directed.  Clean the wound daily or as directed.  Only take over-the-counter or prescription medicines for pain, discomfort, or fever as directed by your caregiver.  Keep appointments as directed. SEEK IMMEDIATE MEDICAL CARE IF:  You develop redness, swelling, numbness or increasing pain in the wound.  There is  pus coming from the wound.  You develop an unexplained oral temperature above 102 F (38.9 C) or as your caregiver suggests.  There is a foul (bad) smell coming from the wound or dressing.  There is a breaking open of the wound (edges not staying together) after sutures or staples have been removed. MAKE SURE YOU:   Understand these instructions.  Will watch your condition.  Will get help right away if you are not doing well or get worse. Document Released: 01/25/2005 Document Revised: 05/29/2011 Document Reviewed: 12/25/2007 [facility] Patient Information 2015 [place], [place]. This information is not intended to replace advice given to you by your health care provider. Make sure you discuss any questions you have with your health care provider.

## 2013-11-04 NOTE — ED Notes (Signed)
Pt smashed finger last night b/w two rocks, pt states finger will not stop bleeding. Pt is on blood thinner.

## 2013-11-04 NOTE — ED Provider Notes (Signed)
CSN: [id number]     Arrival date & time 11/04/13  1659 History   First MD Initiated Contact with Patient 11/04/13 1725     Chief Complaint  Patient presents with  . Finger Injury     (Consider location/radiation/quality/duration/timing/severity/associated sxs/prior Treatment) Patient is a 68 y.o. male presenting with hand injury. The history is provided by the patient.  Hand Injury Location:  Finger Time since incident:  1 day Injury: yes   Mechanism of injury: crush   Crush injury:    Mechanism: rocks. Finger location:  L index finger Pain details:    Quality:  Aching   Radiates to:  Does not radiate   Severity:  Mild   Onset quality:  Sudden   Timing:  Intermittent   Progression:  Improving Chronicity:  New Handedness:  Right-handed Dislocation: no   Foreign body present:  No foreign bodies Prior injury to area:  No Relieved by:  Nothing Worsened by:  Nothing tried Associated symptoms: no fever     Past Medical History  Diagnosis Date  . Adenopathy     RIGHT ADRENAL  . Anxiety   . Arthritis     KNEES/HANDS  . Hx of melanoma of skin   . PAF (paroxysmal atrial fibrillation)     a. not currently on anticoagulation (09/2012)  . Non-small cell lung cancer     a. s/p resection and XRT.  [name] CAD (coronary artery disease)     a. Prior known CTO of LCX with recanalizationby cath 2008;  b. 09/2012 NSTEMI/Cath/PCI: LM nl, LAD 10m, D1 50p, LCX 100p (2.75x16 Promus Premier DES), 80-62m, OM1 30, RCA 55m (3.0x16 Promus Premier DES), EF 50-55%.   Past Surgical History  Procedure Laterality Date  . Lobectomy left upper lobe  08/15/2001  . Adrenalextomy  02/2000  . Right vats, mini thoracotomy, wedge resection of right upper  01/18/2004  . Cardiac catheterization  2008    LAD 40, D1 OK, D2 50, CFX 90, RCA OK, EF 60%; Consider PCI PRN but the stenosis is quite complex and we would end up jailing several large posterolateral branches     Family History  Problem Relation Age of  Onset  . Heart attack Father    History  Substance Use Topics  . Smoking status: Former Smoker -- 54 years    Quit date: 08/15/2001  . Smokeless tobacco: Not on file  . Alcohol Use: No    Review of Systems  Constitutional: Negative for fever and chills.  Respiratory: Negative for cough and shortness of breath.   All other systems reviewed and are negative.     Allergies  Codeine  Home Medications   Prior to Admission medications   Medication Sig Start Date End Date Taking? Authorizing Provider  aspirin EC 81 MG EC tablet Take 1 tablet (81 mg total) by mouth daily. 10/18/12  Yes Rogelia Mire, NP  atorvastatin (LIPITOR) 40 MG tablet Take 40 mg by mouth at bedtime.    Yes Historical Provider, MD  clonazePAM (KLONOPIN) 1 MG tablet Take 1 mg by mouth 3 (three) times daily.    Yes Historical Provider, MD  nitroGLYCERIN (NITROSTAT) 0.4 MG SL tablet Place 1 tablet (0.4 mg total) under the tongue every 5 (five) minutes x 3 doses as needed for chest pain. 10/18/12  Yes Rogelia Mire, NP  prasugrel (EFFIENT) 10 MG TABS Take 1 tablet (10 mg total) by mouth daily. 10/18/12  Yes Rogelia Mire, NP   BP 135/73  Pulse 52  Temp(Src) 98 F (36.7 C) (Oral)  Resp 18  SpO2 100% Physical Exam  Nursing note and vitals reviewed. Constitutional: He is oriented to person, place, and time. He appears well-developed and well-nourished. No distress.  HENT:  Head: Normocephalic and atraumatic.  Mouth/Throat: Oropharynx is clear and moist. No oropharyngeal exudate.  Eyes: EOM are normal. Pupils are equal, round, and reactive to light.  Neck: Normal range of motion. Neck supple.  Cardiovascular: Normal rate and regular rhythm.  Exam reveals no friction rub.   No murmur heard. Pulmonary/Chest: Effort normal and breath sounds normal. No respiratory distress. He has no wheezes. He has no rales.  Abdominal: He exhibits no distension. There is no tenderness. There is no rebound.    Musculoskeletal: Normal range of motion. He exhibits no edema.       Hands: Neurological: He is alert and oriented to person, place, and time.  Skin: He is not diaphoretic.    ED Course  NAIL REMOVAL Date/Time: 11/04/2013 7:09 PM Performed by: Evelina Bucy Authorized by: Evelina Bucy Consent: Verbal consent obtained. Location: left hand Location details: left index finger Patient sedated: no Preparation: skin prepped with alcohol Nail removal amount: Nail only trephinated. Nail removed location: center. Wedge excision of skin of nail fold: no Nail bed sutured: no Nail matrix removed: none Removed nail replaced and anchored: no Patient tolerance: Patient tolerated the procedure well with no immediate complications. Comments: Nail trephinated with electrocautery   (including critical care time) Labs Review Labs Reviewed - No data to display  Imaging Review No results found.   EKG Interpretation None      MDM   Final diagnoses:  Subungual hematoma of second finger of left hand, initial encounter    68 year old male here with left index finger injury. The corner of his index finger was smashed neurological moving them in his garden. Continual bleeding and oozing from under the nail. He is on Eliquis. AFVSS here. L index finger with subungual hematoma ~ 80-90%. No laceration. No bleeding on my exam. Trephinated as above, stable for discharge. He refused xray.    Evelina Bucy, MD 11/04/13 1910

## 2013-11-04 NOTE — ED Notes (Signed)
Patient states he smashed his finger between two rocks yesterday evening @1900 . Patient states this site is still bleeding. Patient states the laceration is the the edge of the nail bed. Patient denies pain.

## 2013-12-01 DIAGNOSIS — Z23 Encounter for immunization: Secondary | ICD-10-CM | POA: Diagnosis not present

## 2013-12-08 ENCOUNTER — Telehealth: Payer: Self-pay | Admitting: *Deleted

## 2013-12-08 NOTE — Telephone Encounter (Signed)
Effient samples placed at the front desk for pick up.

## 2013-12-18 ENCOUNTER — Ambulatory Visit (HOSPITAL_BASED_OUTPATIENT_CLINIC_OR_DEPARTMENT_OTHER): Payer: Medicare Other | Admitting: Oncology

## 2013-12-18 ENCOUNTER — Telehealth: Payer: Self-pay | Admitting: Oncology

## 2013-12-18 VITALS — BP 123/74 | HR 44 | Temp 97.8°F | Resp 18 | Ht 71.0 in | Wt 214.2 lb

## 2013-12-18 DIAGNOSIS — Z85820 Personal history of malignant melanoma of skin: Secondary | ICD-10-CM

## 2013-12-18 DIAGNOSIS — Z85118 Personal history of other malignant neoplasm of bronchus and lung: Secondary | ICD-10-CM

## 2013-12-18 DIAGNOSIS — C3490 Malignant neoplasm of unspecified part of unspecified bronchus or lung: Secondary | ICD-10-CM

## 2013-12-18 NOTE — Progress Notes (Signed)
  [place] OFFICE PROGRESS NOTE   Diagnosis: Non-small cell lung cancer  INTERVAL HISTORY:   Benjamin [name] returns as scheduled. He reports stable exertional dyspnea. He had a myocardial infarction in August of 2014. He is followed by Dr. Acie Fredrickson. A restaging chest CT 07/15/2013 ordered by Dr. Roxan Hockey revealed no evidence of recurrent lung cancer.  Objective:  Vital signs in last 24 hours:  Blood pressure 123/74, pulse 44, temperature 97.8 F (36.6 C), temperature source Oral, resp. rate 18, height 5\' 11"  (1.803 m), weight 214 lb 3.2 oz (97.16 kg), SpO2 98.00%.    HEENT: Neck without mass Lymphatics: No cervical, supraclavicular, or axillary nodes Resp: Clear bilaterally Cardio: Regular rate and rhythm, bradycardia GI: No hepatomegaly Vascular: Pitting edema at the right greater than left lower leg and ankle  Skin: Scar at the right upper back without evidence of recurrent tumor   Medications: I have reviewed the patient's current medications.  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  2. 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  3. 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  4. 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 A re-staging PET scan June 28, 2009, showed no evidence for disease progression. CT of the chest 11/22/202012 showed no evidence of disease progression. CT chest 07/15/2013 with no evidence of recurrent lung cancer 5. History of questionable small bilateral axillary lymph nodes.  6. History of bradycardia - followed by Dr Acie Fredrickson.  7. Chronic exertional dyspnea following lung surgery.  8. Chronic right "hip" discomfort.  9. history of mild thrombocytopenia-followed by Dr. Felipa Eth  10. Coronary artery disease, status post a myocardial infarction in August of  2014    Disposition:  He remains in clinical remission from non-small cell lung cancer and melanoma. Benjamin [name] would like to continue followup at the [facility]. He will return for an office visit in one year.  Betsy Coder, MD  12/18/2013  10:47 AM

## 2013-12-18 NOTE — Telephone Encounter (Signed)
gv adn printed appt sched for pt for OCT 2016

## 2014-01-13 ENCOUNTER — Telehealth: Payer: Self-pay

## 2014-01-13 NOTE — Telephone Encounter (Signed)
Patient called to get samples of effient

## 2014-01-19 ENCOUNTER — Telehealth: Payer: Self-pay | Admitting: Cardiovascular Disease

## 2014-01-19 NOTE — Telephone Encounter (Signed)
Patient advised, no change in medication and no antibiotics needed for a dental cleaning.

## 2014-01-19 NOTE — Telephone Encounter (Signed)
New Message        Pt calling wanting to know if his meds need to be adjusted or if he needs to be put on antibiotics in order for him to get his teeth cleaned. Please call pt back to advise.

## 2014-02-06 ENCOUNTER — Telehealth: Payer: Self-pay | Admitting: Cardiovascular Disease

## 2014-02-06 NOTE — Telephone Encounter (Signed)
PT CALLING FOR EFFIENT 10 MG SAMPLES , PER DR Acie Fredrickson [phone]

## 2014-02-06 NOTE — Telephone Encounter (Signed)
Placed samples of effeint up front for patient to pick up. I called the patient to let him know this

## 2014-02-11 ENCOUNTER — Other Ambulatory Visit: Payer: Self-pay | Admitting: Geriatric Medicine

## 2014-02-11 ENCOUNTER — Ambulatory Visit
Admission: RE | Admit: 2014-02-11 | Discharge: 2014-02-11 | Disposition: A | Discharge status: Home / Self Care | Payer: Medicare Other | Source: Ambulatory Visit | Attending: Geriatric Medicine | Admitting: Geriatric Medicine

## 2014-02-11 DIAGNOSIS — I517 Cardiomegaly: Secondary | ICD-10-CM | POA: Diagnosis not present

## 2014-02-11 DIAGNOSIS — J40 Bronchitis, not specified as acute or chronic: Secondary | ICD-10-CM | POA: Diagnosis not present

## 2014-02-11 DIAGNOSIS — R0989 Other specified symptoms and signs involving the circulatory and respiratory systems: Secondary | ICD-10-CM | POA: Diagnosis not present

## 2014-02-11 DIAGNOSIS — R05 Cough: Secondary | ICD-10-CM

## 2014-02-11 DIAGNOSIS — R0602 Shortness of breath: Secondary | ICD-10-CM | POA: Diagnosis not present

## 2014-02-11 DIAGNOSIS — R059 Cough, unspecified: Secondary | ICD-10-CM

## 2014-02-26 ENCOUNTER — Encounter (HOSPITAL_COMMUNITY): Payer: Self-pay | Admitting: Cardiovascular Disease

## 2014-03-02 DIAGNOSIS — F411 Generalized anxiety disorder: Secondary | ICD-10-CM | POA: Diagnosis not present

## 2014-03-03 ENCOUNTER — Encounter: Payer: Self-pay | Admitting: Cardiovascular Disease

## 2014-03-03 ENCOUNTER — Ambulatory Visit (INDEPENDENT_AMBULATORY_CARE_PROVIDER_SITE_OTHER): Payer: Medicare Other | Admitting: Cardiovascular Disease

## 2014-03-03 VITALS — BP 142/84 | HR 42 | Ht 71.0 in | Wt 215.4 lb

## 2014-03-03 DIAGNOSIS — I2510 Atherosclerotic heart disease of native coronary artery without angina pectoris: Secondary | ICD-10-CM

## 2014-03-03 DIAGNOSIS — I441 Atrioventricular block, second degree: Secondary | ICD-10-CM | POA: Diagnosis not present

## 2014-03-03 LAB — HEPATIC FUNCTION PANEL
ALT: 19 U/L (ref 0–53)
AST: 19 U/L (ref 0–37)
Albumin: 4 g/dL (ref 3.5–5.2)
Alkaline Phosphatase: 62 U/L (ref 39–117)
BILIRUBIN DIRECT: 0.1 mg/dL (ref 0.0–0.3)
BILIRUBIN TOTAL: 0.9 mg/dL (ref 0.2–1.2)
Total Protein: 6.9 g/dL (ref 6.0–8.3)

## 2014-03-03 LAB — BASIC METABOLIC PANEL
BUN: 14 mg/dL (ref 6–23)
CHLORIDE: 105 meq/L (ref 96–112)
CO2: 26 meq/L (ref 19–32)
Calcium: 8.9 mg/dL (ref 8.4–10.5)
Creatinine, Ser: 0.9 mg/dL (ref 0.4–1.5)
GFR: 90.77 mL/min (ref >60.00)
GLUCOSE: 101 mg/dL — AB (ref 70–99)
POTASSIUM: 3.9 meq/L (ref 3.5–5.1)
Sodium: 136 mEq/L (ref 135–145)

## 2014-03-03 LAB — LIPID PANEL
CHOL/HDL RATIO: 3
Cholesterol: 121 mg/dL (ref 0–200)
HDL: 38.7 mg/dL — ABNORMAL LOW (ref >39.00)
LDL CALC: 65 mg/dL (ref 0–99)
NONHDL: 82.3
Triglycerides: 89 mg/dL (ref 0.0–149.0)
VLDL: 17.8 mg/dL (ref 0.0–40.0)

## 2014-03-03 NOTE — Assessment & Plan Note (Signed)
He is in sinus brady today. No symptoms of syncope or pre-syncope I've discussed the symptoms that would indicate that he needs a pacer. He understands.

## 2014-03-03 NOTE — Assessment & Plan Note (Signed)
Benjamin [name] is doing well. He can stop his Effient. No CP or dyspnea

## 2014-03-03 NOTE — Progress Notes (Signed)
Benjamin [name] Date of Birth: 1945-08-02 Medical Record #[id number]   Problem List: 1. [name] - DES to RCA and prox LCx.  (10/16/12) 2. Benjamin Atrial fib: 3. Non-small cell Benjamin ca. 4. Leg edema   History of Present Illness: Benjamin [name] is seen back today for a post hospital visit. Seen for Dr. Acie Fredrickson. Has [name] with known disease and prior LCX occlusion per cath in 2001 - follow up cath in 2008 showed recanalization of the lCX and otherwise nonobstructive disease, PAF, anxiety, mobitz type 1 second degree AV block - not on beta blocker therapy. Has also had Benjamin cancer in the past treated with surgery and XRT.   Has had recent NSTEMI and had PCI/DES of the RCA and LCX. EF is 50 to 55% per cath. Was noted to have PAF during this admission - not currently on anticoagulation in the setting of dual antiplatelet therapy but this need to be addressed in the future. His CHADSVASc = 2.   Comes in today. Here with his wife. He is doing ok. Has been home about 5 weeks. Had a little chest discomfort initially - none over the past 3 weeks - no NTG use. Feels ok. Mostly limited by his breathing due to his Benjamin cancer history/treatment. No bleeding or bruising. Not fasting today.   Nov. 3, 2014:  Jul 30, 2013:  Benjamin [name] is doing ok.  Has had some anxiety . Rare CP. Typically at rest.  Mild arm discomfort with walking .  He has had lots of Benjamin [name] ( non-small cell CA) and is limited by dyspnea.    August 20, 2013:  Benjamin [name] is doing well.  His left arm pain has improved.  His myoview was intermediate risk. - he has a scar in the Lcx distribution with possible periinfarct ischimia.  He had significant uptake in the bowel which made interpretation of the myoview difficult   He is chronically short of breath from his multiple Benjamin [name]. He really is not having any symptoms of angina.  Dec. 15, 2015:  Benjamin [name] is a 68 yo who I follow for [name], Benjamin [name], Benjamin [name],    His HR is slow but no episodes of syncope or presyncope Has some ankle  swelling at the end of the day.  He avoids salt.      Current Outpatient Prescriptions  Medication Sig Dispense Refill  . aspirin EC 81 MG EC tablet Take 1 tablet (81 mg total) by mouth daily.    [name] atorvastatin (LIPITOR) 40 MG tablet Take 40 mg by mouth at bedtime.     . clonazePAM (KLONOPIN) 1 MG tablet Take 1 mg by mouth 3 (three) times daily.     . nitroGLYCERIN (NITROSTAT) 0.4 MG SL tablet Place 1 tablet (0.4 mg total) under the tongue every 5 (five) minutes x 3 doses as needed for chest pain. 25 tablet 3  . prasugrel (EFFIENT) 10 MG TABS Take 1 tablet (10 mg total) by mouth daily. 30 tablet 6   No current facility-administered medications for this visit.    Allergies  Allergen Reactions  . Codeine Other (See Comments)    Dizziness, stomach pain    Past Medical History  Diagnosis Date  . Adenopathy     RIGHT ADRENAL  . Anxiety   . Arthritis     KNEES/HANDS  . Hx of melanoma of skin   . PAF (Benjamin atrial fibrillation)     a. not currently  on anticoagulation (09/2012)  . Non-small cell Benjamin cancer     a. s/p resection and XRT.  [name] [name] (coronary artery disease)     a. Prior known CTO of LCX with recanalizationby cath 2008;  b. 09/2012 NSTEMI/Cath/PCI: LM nl, LAD 26m, D1 50p, LCX 100p (2.75x16 Promus Premier DES), 80-94m, OM1 30, RCA 20m (3.0x16 Promus Premier DES), EF 50-55%.    Past Surgical History  Procedure Laterality Date  . Lobectomy left upper lobe  08/15/2001  . Adrenalextomy  02/2000  . Right vats, mini thoracotomy, wedge resection of right upper  01/18/2004  . Cardiac catheterization  2008    LAD 40, D1 OK, D2 50, CFX 90, RCA OK, EF 60%; Consider PCI PRN but the stenosis is quite complex and we would end up jailing several large posterolateral branches    . Left heart catheterization with coronary angiogram N/A 10/17/2012    Procedure: LEFT HEART CATHETERIZATION WITH CORONARY  ANGIOGRAM;  Surgeon: Sherren Mocha, MD;  Location: [facility] CATH LAB;  Service: Cardiovascular;  Laterality: N/A;    History  Smoking status  . Former Smoker -- 52 years  . Quit date: 08/15/2001  Smokeless tobacco  . Not on file    History  Alcohol Use No    Family History  Problem Relation Age of Onset  . Heart attack Father     Review of Systems: The review of systems is per the HPI.  All other systems were reviewed and are negative.  Physical Exam: BP 142/84 mmHg  Pulse 42  Ht 5\' 11"  (1.803 m)  Wt 215 lb 6.4 oz (97.705 kg)  BMI 30.06 kg/m2 The patient is alert and oriented x 3.  The mood and affect are normal.  The skin is warm and dry.  Color is normal.   HEENT exam reveals that the sclera are nonicteric.  The mucous membranes are moist.  The carotids are 2+ without bruits.  There is no thyromegaly.  There is no JVD.   Lungs are clear.  The chest wall is non tender.   Cor:   reveals a regular rate with a normal S1 and S2.  [name]   HR is slow.   There are no murmurs, gallops, or rubs.  The PMI is not displaced.    Abdominal exam reveals good bowel sounds.  There is no guarding or rebound.  There is no hepatosplenomegaly or tenderness.  There are no masses.  Exam of the legs reveal no clubbing, cyanosis, 2 + edema o right.  Trace edema on left .  The legs are without rashes.  The distal pulses are intact.   Cranial nerves II - XII are intact.  Motor and sensory functions are intact.  The gait is normal.   LABORATORY DATA: CBC/BMET and EKG pending  Lab Results  Component Value Date   WBC 6.2 11/22/2012   HGB 14.5 11/22/2012   HCT 43.6 11/22/2012   PLT 141.0* 11/22/2012   GLUCOSE 101* 07/30/2013   CHOL 107 07/30/2013   TRIG 65.0 07/30/2013   HDL 46.50 07/30/2013   LDLCALC 48 07/30/2013   ALT 23 07/30/2013   AST 19 07/30/2013   NA 140 07/30/2013   K 4.1 07/30/2013   CL 104 07/30/2013   CREATININE 0.9 07/30/2013   BUN 12 07/30/2013   CO2 30 07/30/2013   TSH 0.774  10/16/2012   INR 1.08 10/16/2012   HGBA1C 5.5 10/16/2012    Lab Results  Component Value Date   TROPONINI 15.20*  10/17/2012    Angiographic Findings:  10/16/12  Left main: No obstructive disease.  Left Anterior Descending Artery: Large caliber vessel that courses to the apex. The mid vessel has diffuse 50% stenosis. There are several diagonal branches. The first diagonal branch is moderate in caliber with proximal 50% stenosis. The second and third diagonal branches are small in caliber with mild proximal disease.  Circumflex Artery: 100% proximal occlusion after a very early moderate caliber OM branch. The first OM branch has diffuse 30% stenosis. After PCI, the mid AV groove Circumflex is seen to fill three OM branches. The AV groove Circumflex has diffuse 80-90% stenosis between the takeoff of the second and third OM branches with same appearance as the cath in 2008.  Right Coronary Artery: Large caliber dominant vessel with hazy 90% mid stenosis with appearance of unstable plaque.   Left Ventricular Angiogram: LVEF=50-55%.   Impression:  1. 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  2. Severe stenosis, unstable plaque mid RCA  3. NSTEMI  4. Preserved LV systolic function  5. Successful PTCA/DES x 1 mid RCA  6. Successful PTCA/DES x 1 proximal Circumflex   Recommendations: He will need dual anti-platelet therapy with ASA and Effient for one year. Continue statin. No beta blocker with heart block.   Complications: None. The patient tolerated the procedure well.   T.: 03/03/2014: Sinus bradycardia 42 beats a minute. He has no ST or T wave changes. His first degree AV block. Assessment / Plan:

## 2014-03-03 NOTE — Patient Instructions (Addendum)
Your physician has recommended you make the following change in your medication:  1) STOP Effient  Your physician has requested that you have an echocardiogram. Echocardiography is a painless test that uses sound waves to create images of your heart. It provides your doctor with information about the size and shape of your heart and how well your heart's chambers and valves are working. This procedure takes approximately one hour. There are no restrictions for this procedure.  Fasting labs today: Lipid panel, Liver function, BMET  Your physician wants you to follow-up in: 6 months with Dr. Acie Fredrickson with fasting labs. You will receive a reminder letter in the mail two months in advance. If you don't receive a letter, please call our office to schedule the follow-up appointment.  You will need to FAST for this appointment. Nothing to eat or drink after midnight the night before expect water.

## 2014-03-19 ENCOUNTER — Telehealth: Payer: Self-pay | Admitting: Cardiovascular Disease

## 2014-03-19 ENCOUNTER — Ambulatory Visit (HOSPITAL_COMMUNITY): Payer: Medicare Other | Attending: Cardiovascular Disease | Admitting: Radiology

## 2014-03-19 DIAGNOSIS — I2510 Atherosclerotic heart disease of native coronary artery without angina pectoris: Secondary | ICD-10-CM | POA: Diagnosis not present

## 2014-03-19 NOTE — Telephone Encounter (Signed)
New Prob    Pt returning call from earlier. Please call.

## 2014-03-19 NOTE — Telephone Encounter (Signed)
Pt is aware of echo results Horton Chin RN

## 2014-03-19 NOTE — Progress Notes (Signed)
Echocardiogram performed.  

## 2014-03-24 ENCOUNTER — Telehealth: Payer: Self-pay | Admitting: Cardiovascular Disease

## 2014-03-24 NOTE — Telephone Encounter (Signed)
New message ° ° ° ° ° °Returning a nurses call °

## 2014-03-24 NOTE — Telephone Encounter (Signed)
Spoke with patient and advised him that I have not attempted to call him.  Patient states he had a message from someone at our office.  I have reviewed patient's chart including appointments, potential refills, and results and do not see anything that is pending or needs to be scheduled.  I advised patient to call back with questions or concerns and otherwise we will see him for follow-up in 6 months.  Patient verbalized understanding and agreement.

## 2014-04-23 DIAGNOSIS — Z08 Encounter for follow-up examination after completed treatment for malignant neoplasm: Secondary | ICD-10-CM | POA: Diagnosis not present

## 2014-04-23 DIAGNOSIS — D225 Melanocytic nevi of trunk: Secondary | ICD-10-CM | POA: Diagnosis not present

## 2014-04-23 DIAGNOSIS — L821 Other seborrheic keratosis: Secondary | ICD-10-CM | POA: Diagnosis not present

## 2014-04-23 DIAGNOSIS — Z85820 Personal history of malignant melanoma of skin: Secondary | ICD-10-CM | POA: Diagnosis not present

## 2014-04-23 DIAGNOSIS — L820 Inflamed seborrheic keratosis: Secondary | ICD-10-CM | POA: Diagnosis not present

## 2014-04-27 DIAGNOSIS — D696 Thrombocytopenia, unspecified: Secondary | ICD-10-CM | POA: Diagnosis not present

## 2014-04-27 DIAGNOSIS — Z0000 Encounter for general adult medical examination without abnormal findings: Secondary | ICD-10-CM | POA: Diagnosis not present

## 2014-04-27 DIAGNOSIS — E780 Pure hypercholesterolemia: Secondary | ICD-10-CM | POA: Diagnosis not present

## 2014-04-27 DIAGNOSIS — I2510 Atherosclerotic heart disease of native coronary artery without angina pectoris: Secondary | ICD-10-CM | POA: Diagnosis not present

## 2014-04-27 DIAGNOSIS — F411 Generalized anxiety disorder: Secondary | ICD-10-CM | POA: Diagnosis not present

## 2014-04-27 DIAGNOSIS — Z79899 Other long term (current) drug therapy: Secondary | ICD-10-CM | POA: Diagnosis not present

## 2014-04-27 DIAGNOSIS — R609 Edema, unspecified: Secondary | ICD-10-CM | POA: Diagnosis not present

## 2014-04-27 DIAGNOSIS — Z1389 Encounter for screening for other disorder: Secondary | ICD-10-CM | POA: Diagnosis not present

## 2014-05-28 ENCOUNTER — Other Ambulatory Visit: Payer: Self-pay

## 2014-05-28 DIAGNOSIS — C3491 Malignant neoplasm of unspecified part of right bronchus or lung: Secondary | ICD-10-CM

## 2014-07-07 ENCOUNTER — Ambulatory Visit
Admission: RE | Admit: 2014-07-07 | Discharge: 2014-07-07 | Disposition: A | Discharge status: Home / Self Care | Payer: Medicare Other | Source: Ambulatory Visit | Attending: Thoracic Surgery (Cardiothoracic Vascular Surgery) | Admitting: Thoracic Surgery (Cardiothoracic Vascular Surgery)

## 2014-07-07 ENCOUNTER — Ambulatory Visit (INDEPENDENT_AMBULATORY_CARE_PROVIDER_SITE_OTHER): Payer: Medicare Other | Admitting: Thoracic Surgery (Cardiothoracic Vascular Surgery)

## 2014-07-07 ENCOUNTER — Encounter: Payer: Self-pay | Admitting: Thoracic Surgery (Cardiothoracic Vascular Surgery)

## 2014-07-07 VITALS — BP 137/87 | HR 86 | Resp 16 | Ht 71.0 in | Wt 215.0 lb

## 2014-07-07 DIAGNOSIS — Z85118 Personal history of other malignant neoplasm of bronchus and lung: Secondary | ICD-10-CM | POA: Diagnosis not present

## 2014-07-07 DIAGNOSIS — R911 Solitary pulmonary nodule: Secondary | ICD-10-CM | POA: Diagnosis not present

## 2014-07-07 DIAGNOSIS — C3491 Malignant neoplasm of unspecified part of right bronchus or lung: Secondary | ICD-10-CM

## 2014-07-07 NOTE — Progress Notes (Signed)
[address]       [place] [address]             [phone]     HPI:  Benjamin [name] returns today for a scheduled one year followup visit. He is a 69 year old gentleman with a history of multiple lung cancers. 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 He had a right upper lobectomy in 2005 per second non-small cell. Both surgeries were done by Dr. Arlyce Dice. He then developed a third non-small cell cancer in the left lower lobe and 2009. That was treated with radiation. When Dr. Arlyce Dice retired I began following [name].  A CT in 2014 showed chronic changes on the left from radiation and a groundglass opacity in the right lung which was unchanged. I saw him again last year in the CT was unchanged.  He does have shortness of breath with exertion. He has not had any recent worsening of his shortness of breath. He has not had any problems with wheezing. Says he does occasionally have chest pain, but never enough to have to take nitroglycerin. His appetite is good. He denies weight loss. He has not had any unusual headaches or visual changes. He has an occasional cough, denies hemoptysis.  Past Medical History  Diagnosis Date  . Adenopathy     RIGHT ADRENAL  . Anxiety   . Arthritis     KNEES/HANDS  . Hx of melanoma of skin   . PAF (paroxysmal atrial fibrillation)     a. not currently on anticoagulation (09/2012)  . Non-small cell lung cancer     a. s/p resection and XRT.  [name] CAD (coronary artery disease)     a. Prior known CTO of LCX with recanalizationby cath 2008;  b. 09/2012 NSTEMI/Cath/PCI: LM nl, LAD 67m D1 50p, LCX 100p (2.75x16 Promus Premier DES), 80-996mOM1 30, RCA 9010m.0x16 Promus Premier DES), EF 50-55%.      Current Outpatient Prescriptions  Medication Sig Dispense Refill  . aspirin EC 81 MG EC tablet Take 1 tablet (81 mg total) by mouth daily.    . aMarland Kitchenorvastatin (LIPITOR) 20 MG tablet Take 20 mg by mouth daily.    .  clonazePAM (KLONOPIN) 1 MG tablet Take 1 mg by mouth 3 (three) times daily.     . nitroGLYCERIN (NITROSTAT) 0.4 MG SL tablet Place 1 tablet (0.4 mg total) under the tongue every 5 (five) minutes x 3 doses as needed for chest pain. 25 tablet 3   No current facility-administered medications for this visit.    Physical Exam  BP 137/87 mmHg  Pulse 86  Resp 16  Ht '5\' 11"'$  (1.803 m)  Wt 215 lb (97.523 kg)  BMI 30.00 kg/m2  22pO2  [age]ar old man in no acute distress Well-developed and well-nourished Alert and oriented 3 with no focal neurologic deficits No cervical or subclavicular adenopathy Cardiac regular rate and rhythm normal S1 and S2 Lungs diminished breath sounds bilaterally, no wheezing 2+ edema RLE, 1+ LLE  Diagnostic Tests: CT chest images and report reviewed CT CHEST WITHOUT CONTRAST  TECHNIQUE: Multidetector CT imaging of the chest was performed following the standard protocol without IV contrast.  COMPARISON: CT chest dated 07/15/2013.  FINDINGS: Mediastinum/Nodes: Heart is normal in size. No pericardial effusion.  Three vessel coronary atherosclerosis.  Mild atherosclerotic calcifications of the aortic arch.  Calcified mediastinal and right hilar lymph nodes.  Visualized thyroid is unremarkable.  Lungs/Pleura:  Status post left upper lobectomy.  Mass-like radiation fibrosis in the posterolateral left lower lobe, measuring 4.5 x 6.0 cm (series 4/ image 21), unchanged.  Postsurgical changes related to right upper lobe wedge resection (series 4/ image 20).  2.9 x 2.6 cm ground-glass nodule in the right lower lobe (series 4/ image 39), unchanged.  No new/ suspicious pulmonary nodules.  Underlying mild to moderate centrilobular and paraseptal emphysematous changes.  No pleural effusion or pneumothorax.  Upper abdomen: Visualized upper abdomen is notable for surgical clips related to prior right adrenalectomy.  Musculoskeletal:  Degenerative changes of the visualized thoracolumbar spine.  IMPRESSION: Status post left upper lobectomy.  Mass-like radiation fibrosis in the left lower lobe, unchanged.  2.9 cm ground-glass nodule in the right lower lobe, unchanged. Follow-up CT chest is suggested in 1 year.  This recommendation follows the consensus statement: Recommendations for the Management of Subsolid Pulmonary Nodules Detected at CT: A Statement from the [place]. Radiology 3202;334:[id number].   Electronically Signed  By: Julian Hy M.D.  On: 07/07/2014 15:18  Impression: 69 year old gentleman with a history of multiple previous lung cancers. He returns today for an annual follow-up visit. Clinically he is doing well. His CT today shows no change from a year ago. He does have a groundglass opacity in the right lower lobe that needs continued follow-up.  He had questions regarding his peripheral edema. Peripheral edema. I recommended elevation of his legs when possible and using compression stockings. He should talk to his primary about whether or not a fluid pill as necessary.  He's not use his nitroglycerin in a couple of years but is not refill his prescription. I advised him to refill that.  Plan:  Return in one year with CT of chest Melrose Nakayama, MD [facility] [phone]

## 2014-09-14 ENCOUNTER — Ambulatory Visit (INDEPENDENT_AMBULATORY_CARE_PROVIDER_SITE_OTHER): Payer: Medicare Other | Admitting: Cardiovascular Disease

## 2014-09-14 ENCOUNTER — Encounter: Payer: Self-pay | Admitting: Cardiovascular Disease

## 2014-09-14 VITALS — BP 130/72 | HR 44 | Ht 71.0 in | Wt 214.0 lb

## 2014-09-14 DIAGNOSIS — R600 Localized edema: Secondary | ICD-10-CM

## 2014-09-14 DIAGNOSIS — E785 Hyperlipidemia, unspecified: Secondary | ICD-10-CM

## 2014-09-14 DIAGNOSIS — I82403 Acute embolism and thrombosis of unspecified deep veins of lower extremity, bilateral: Secondary | ICD-10-CM | POA: Diagnosis not present

## 2014-09-14 DIAGNOSIS — I2510 Atherosclerotic heart disease of native coronary artery without angina pectoris: Secondary | ICD-10-CM

## 2014-09-14 LAB — HEPATIC FUNCTION PANEL
ALBUMIN: 4 g/dL (ref 3.5–5.2)
ALK PHOS: 64 U/L (ref 39–117)
ALT: 17 U/L (ref 0–53)
AST: 17 U/L (ref 0–37)
Bilirubin, Direct: 0.1 mg/dL (ref 0.0–0.3)
Total Bilirubin: 0.8 mg/dL (ref 0.2–1.2)
Total Protein: 6.8 g/dL (ref 6.0–8.3)

## 2014-09-14 LAB — BASIC METABOLIC PANEL
BUN: 12 mg/dL (ref 6–23)
CHLORIDE: 105 meq/L (ref 96–112)
CO2: 31 meq/L (ref 19–32)
CREATININE: 0.98 mg/dL (ref 0.40–1.50)
Calcium: 9 mg/dL (ref 8.4–10.5)
GFR: 80.05 mL/min (ref >60.00)
Glucose, Bld: 97 mg/dL (ref 70–99)
POTASSIUM: 4.1 meq/L (ref 3.5–5.1)
SODIUM: 139 meq/L (ref 135–145)

## 2014-09-14 LAB — LIPID PANEL
CHOLESTEROL: 114 mg/dL (ref 0–200)
HDL: 40.6 mg/dL (ref >39.00)
LDL CALC: 53 mg/dL (ref 0–99)
NonHDL: 73.4
Total CHOL/HDL Ratio: 3
Triglycerides: 102 mg/dL (ref 0.0–149.0)
VLDL: 20.4 mg/dL (ref 0.0–40.0)

## 2014-09-14 NOTE — Progress Notes (Signed)
Benjamin [name] Date of Birth: 09-22-1945 Medical Record #[id number]   Problem List: 1. CAD - DES to RCA and prox LCx.  (10/16/12) 2. Paroxysmal Atrial fib: 3. Non-small cell lung ca. 4. Leg edema   History of Present Illness: Benjamin [name] is seen back today for a post hospital visit. Seen for Dr. Acie Fredrickson. Has CAD with known disease and prior LCX occlusion per cath in 2001 - follow up cath in 2008 showed recanalization of the lCX and otherwise nonobstructive disease, PAF, anxiety, mobitz type 1 second degree AV block - not on beta blocker therapy. Has also had lung cancer in the past treated with surgery and XRT.   Has had recent NSTEMI and had PCI/DES of the RCA and LCX. EF is 50 to 55% per cath. Was noted to have PAF during this admission - not currently on anticoagulation in the setting of dual antiplatelet therapy but this need to be addressed in the future. His CHADSVASc = 2.   Comes in today. Here with his wife. He is doing ok. Has been home about 5 weeks. Had a little chest discomfort initially - none over the past 3 weeks - no NTG use. Feels ok. Mostly limited by his breathing due to his lung cancer history/treatment. No bleeding or bruising. Not fasting today.   Nov. 3, 2014:  Jul 30, 2013:  Benjamin [name] is doing ok.  Has had some anxiety . Rare CP. Typically at rest.  Mild arm discomfort with walking .  He has had lots of lung surgeries ( non-small cell CA) and is limited by dyspnea.    August 20, 2013:  Benjamin [name] is doing well.  His left arm pain has improved.  His myoview was intermediate risk. - he has a scar in the Lcx distribution with possible periinfarct ischimia.  He had significant uptake in the bowel which made interpretation of the myoview difficult   He is chronically short of breath from his multiple lung surgeries. He really is not having any symptoms of angina.  Dec. 15, 2015:  Benjamin [name] is a 69 [name] who I follow for CAD, paroxysmal A-fib, lung cancer - s/p multiple surgeries,    His HR is slow but no episodes of syncope or presyncope Has some ankle  swelling at the end of the day.  He avoids salt.    September 14, 2014:  Doing well from a cardiac standpoint . Has some chronic ankle  edema .  Resolves during night  Has had chronic right ankle edema for years    Current Outpatient Prescriptions  Medication Sig Dispense Refill  . aspirin EC 81 MG EC tablet Take 1 tablet (81 mg total) by mouth daily.    [name] atorvastatin (LIPITOR) 20 MG tablet Take 20 mg by mouth daily.    . clonazePAM (KLONOPIN) 1 MG tablet Take 1 mg by mouth 3 (three) times daily.     . nitroGLYCERIN (NITROSTAT) 0.4 MG SL tablet Place 1 tablet (0.4 mg total) under the tongue every 5 (five) minutes x 3 doses as needed for chest pain. 25 tablet 3   No current facility-administered medications for this visit.    Allergies  Allergen Reactions  . Codeine Other (See Comments)    Dizziness, stomach pain    Past Medical History  Diagnosis Date  . Adenopathy     RIGHT ADRENAL  . Anxiety   . Arthritis     KNEES/HANDS  . Hx of melanoma of skin   .  PAF (paroxysmal atrial fibrillation)     a. not currently on anticoagulation (09/2012)  . Non-small cell lung cancer     a. s/p resection and XRT.  [name] CAD (coronary artery disease)     a. Prior known CTO of LCX with recanalizationby cath 2008;  b. 09/2012 NSTEMI/Cath/PCI: LM nl, LAD 57m D1 50p, LCX 100p (2.75x16 Promus Premier DES), 80-942mOM1 30, RCA 9020m.0x16 Promus Premier DES), EF 50-55%.    Past Surgical History  Procedure Laterality Date  . Lobectomy left upper lobe  08/15/2001  . Adrenalextomy  02/2000  . Right vats, mini thoracotomy, wedge resection of right upper  01/18/2004  . Cardiac catheterization  2008    LAD 40, D1 OK, D2 50, CFX 90, RCA OK, EF 60%; Consider PCI PRN but the stenosis is quite complex and we would end up jailing several large posterolateral branches    . Left heart catheterization with coronary angiogram N/A 10/17/2012     Procedure: LEFT HEART CATHETERIZATION WITH CORONARY ANGIOGRAM;  Surgeon: MicSherren MochaD;  [facility];  Service: Cardiovascular;  Laterality: N/A;    History  Smoking status  . Former Smoker -- 40 26ars  . Quit date: 08/15/2001  Smokeless tobacco  . Not on file    History  Alcohol Use No    Family History  Problem Relation Age of Onset  . Heart attack Father     Review of Systems: The review of systems is per the HPI.  All other systems were reviewed and are negative.  Physical Exam: BP 130/72 mmHg  Pulse 44  Ht '5\' 11"'$  (1.803 m)  Wt 97.07 kg (214 lb)  BMI 29.86 kg/m2  SpO2 94% The patient is alert and oriented x 3.  The mood and affect are normal.  The skin is warm and dry.  Color is normal.   HEENT exam reveals that the sclera are nonicteric.  The mucous membranes are moist.  The carotids are 2+ without bruits.  There is no thyromegaly.  There is no JVD.   Lungs are clear.  The chest wall is non tender.   Cor:   reveals a regular rate with a normal S1 and S2.  .  Marland KitchenHR is slow.   There are no murmurs, gallops, or rubs.  The PMI is not displaced.    Abdominal exam reveals good bowel sounds.  There is no guarding or rebound.  There is no hepatosplenomegaly or tenderness.  There are no masses.  Exam of the legs reveal no clubbing, cyanosis, 2 + edema on right.  Trace edema on left .  The legs are without rashes.  The distal pulses are intact.   Cranial nerves II - XII are intact.  Motor and sensory functions are intact.  The gait is normal.   LABORATORY DATA: CBC/BMET and EKG pending  Lab Results  Component Value Date   WBC 6.2 11/22/2012   HGB 14.5 11/22/2012   HCT 43.6 11/22/2012   PLT 141.0* 11/22/2012   GLUCOSE 101* 03/03/2014   CHOL 121 03/03/2014   TRIG 89.0 03/03/2014   HDL 38.70* 03/03/2014   LDLCALC 65 03/03/2014   ALT 19 03/03/2014   AST 19 03/03/2014   NA 136 03/03/2014   K 3.9 03/03/2014   CL 105 03/03/2014   CREATININE 0.9 03/03/2014    BUN 14 03/03/2014   CO2 26 03/03/2014   TSH 0.774 10/16/2012   INR 1.08 10/16/2012   HGBA1C 5.5 10/16/2012  Lab Results  Component Value Date   TROPONINI 15.20* 10/17/2012    Angiographic Findings:  10/16/12  Left main: No obstructive disease.  Left Anterior Descending Artery: Large caliber vessel that courses to the apex. The mid vessel has diffuse 50% stenosis. There are several diagonal branches. The first diagonal branch is moderate in caliber with proximal 50% stenosis. The second and third diagonal branches are small in caliber with mild proximal disease.  Circumflex Artery: 100% proximal occlusion after a very early moderate caliber OM branch. The first OM branch has diffuse 30% stenosis. After PCI, the mid AV groove Circumflex is seen to fill three OM branches. The AV groove Circumflex has diffuse 80-90% stenosis between the takeoff of the second and third OM branches with same appearance as the cath in 2008.  Right Coronary Artery: Large caliber dominant vessel with hazy 90% mid stenosis with appearance of unstable plaque.   Left Ventricular Angiogram: LVEF=50-55%.   Impression:  1. Triple vessel CAD with NSTEMI secondary to acute occlusion of Circumflex.  2. Severe stenosis, unstable plaque mid RCA  3. NSTEMI  4. Preserved LV systolic function  5. Successful PTCA/DES x 1 mid RCA  6. Successful PTCA/DES x 1 proximal Circumflex   Recommendations: He will need dual anti-platelet therapy with ASA and Effient for one year. Continue statin. No beta blocker with heart block.   Complications: None. The patient tolerated the procedure well.   Assessment / Plan:   1. CAD - DES to RCA and prox LCx.  (10/16/12) -  No angina , continue same meds   2. Paroxysmal Atrial fib: - has remained in NSR   3. Non-small cell lung ca.  4. Leg edema - will get a venous doppler to r/u DVT .   He has had this edema for years.  The edema seems to be especially severe in his ankles.   5.  Hyperlipidemia:  Continue atorvastatin.  His primary medical doctor has lowered the dose to 20 mg a day . Will recheck lipids today .    Nahser, Wonda Cheng, MD  09/14/2014 11:42 AM    [address],  [place], [place]  [address] Pager [phone] Phone: [phone]; Fax: [phone]   [facility]  [address] [place], [place]  [address] [phone]    Fax [phone]

## 2014-09-14 NOTE — Patient Instructions (Signed)
Medication Instructions:  Your physician recommends that you continue on your current medications as directed. Please refer to the Current Medication list given to you today.   Labwork: TODAY - cholesterol, liver, basic metabolic panel   Testing/Procedures: Your physician has requested that you have a lower extremity venous duplex. This test is an ultrasound of the veins in the legs or arms. It looks at venous blood flow that carries blood from the heart to the legs or arms. Allow one hour for a Lower Venous exam. Allow thirty minutes for an Upper Venous exam. There are no restrictions or special instructions.    Follow-Up: Your physician wants you to follow-up in: 6 months with Dr. Acie Fredrickson.  You will receive a reminder letter in the mail two months in advance. If you don't receive a letter, please call our office to schedule the follow-up appointment.

## 2014-09-15 ENCOUNTER — Ambulatory Visit (HOSPITAL_COMMUNITY): Payer: Medicare Other | Attending: Internal Medicine

## 2014-09-15 ENCOUNTER — Telehealth: Payer: Self-pay | Admitting: Cardiovascular Disease

## 2014-09-15 DIAGNOSIS — R600 Localized edema: Secondary | ICD-10-CM | POA: Insufficient documentation

## 2014-09-15 DIAGNOSIS — R609 Edema, unspecified: Secondary | ICD-10-CM | POA: Diagnosis not present

## 2014-09-15 DIAGNOSIS — I82403 Acute embolism and thrombosis of unspecified deep veins of lower extremity, bilateral: Secondary | ICD-10-CM

## 2014-09-15 NOTE — Telephone Encounter (Signed)
Lab results reviewed with patient who verbalized understanding

## 2014-09-15 NOTE — Telephone Encounter (Signed)
New message     Pt returning Michelle's call regarding test results.  Please advise

## 2014-09-17 ENCOUNTER — Telehealth: Payer: Self-pay | Admitting: Cardiovascular Disease

## 2014-09-17 NOTE — Telephone Encounter (Signed)
Notified of LE doppler results.

## 2014-09-17 NOTE — Telephone Encounter (Signed)
Follow Up ° °Pt returned call//  °

## 2014-10-08 DIAGNOSIS — F411 Generalized anxiety disorder: Secondary | ICD-10-CM | POA: Diagnosis not present

## 2014-10-22 DIAGNOSIS — D225 Melanocytic nevi of trunk: Secondary | ICD-10-CM | POA: Diagnosis not present

## 2014-10-22 DIAGNOSIS — Z08 Encounter for follow-up examination after completed treatment for malignant neoplasm: Secondary | ICD-10-CM | POA: Diagnosis not present

## 2014-10-22 DIAGNOSIS — Z85820 Personal history of malignant melanoma of skin: Secondary | ICD-10-CM | POA: Diagnosis not present

## 2014-10-22 DIAGNOSIS — L219 Seborrheic dermatitis, unspecified: Secondary | ICD-10-CM | POA: Diagnosis not present

## 2014-12-19 DIAGNOSIS — Z23 Encounter for immunization: Secondary | ICD-10-CM | POA: Diagnosis not present

## 2014-12-24 ENCOUNTER — Ambulatory Visit (HOSPITAL_BASED_OUTPATIENT_CLINIC_OR_DEPARTMENT_OTHER): Payer: Medicare Other | Admitting: Oncology

## 2014-12-24 ENCOUNTER — Telehealth: Payer: Self-pay | Admitting: Oncology

## 2014-12-24 VITALS — BP 155/67 | HR 36 | Temp 97.5°F | Resp 19 | Ht 71.0 in | Wt 211.9 lb

## 2014-12-24 DIAGNOSIS — C3490 Malignant neoplasm of unspecified part of unspecified bronchus or lung: Secondary | ICD-10-CM

## 2014-12-24 DIAGNOSIS — Z85118 Personal history of other malignant neoplasm of bronchus and lung: Secondary | ICD-10-CM | POA: Diagnosis not present

## 2014-12-24 NOTE — Telephone Encounter (Signed)
GAVE ADN PRINTED APPT SCHED AND AVS FO RPT FOR oct 2017

## 2014-12-24 NOTE — Progress Notes (Signed)
  [facility] OFFICE PROGRESS NOTE   Diagnosis: Non-small cell lung cancer  INTERVAL HISTORY:   Benjamin [name] returns as scheduled. He reports stable exertional dyspnea. No other complaint. He continues x-ray follow-up with Dr. Roxan Hockey.  Objective:  Vital signs in last 24 hours:  Blood pressure 155/67, pulse 36, temperature 97.5 F (36.4 C), temperature source Oral, resp. rate 19, height '5\' 11"'$  (1.803 m), weight 211 lb 14.4 oz (96.117 kg), SpO2 98 %. repeat manual pulse-40    HEENT: Neck without mass Lymphatics: No cervical, supraclavicular, axillary, or inguinal nodes Resp: Decreased breath sounds over the left chest, no respiratory distress Cardio: Regular rate and rhythm, bradycardia GI: No hepatomegaly, no mass Vascular: No leg edema  Skin: Right upper back/shoulder scar without evidence of recurrent tumor   Imaging:  CT chest 07/07/2014-2.9 cm groundglass nodule right lower lobe-unchanged  Medications: I have reviewed the patient's current medications.  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  2. 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  3. 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  4. 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 A re-staging PET scan June 28, 2009, showed no evidence for disease progression. CT of the chest 2020/07/610 showed no evidence of disease progression. CT chest 07/07/2014 revealed no evidence of recurrent lung cancer 5. History of questionable small bilateral axillary lymph nodes.  6. History of bradycardia - followed by Dr Acie Fredrickson.  7. Chronic exertional dyspnea following lung surgery.  8. Chronic right "hip" discomfort.  9. history of mild thrombocytopenia-followed by Dr. Felipa Eth  10. Coronary artery disease, status post a myocardial infarction  in August of 2014     Disposition:  Benjamin [name] remains in clinical remission from non-small cell lung cancer. He will continue CT follow-up with Dr. Roxan Hockey. He would like to continue the yearly appointment at the [facility]. Benjamin [name] will return for office visit in one year.  Betsy Coder, MD  12/24/2014  5:24 PM

## 2015-03-29 ENCOUNTER — Ambulatory Visit: Payer: Medicare Other | Admitting: Cardiovascular Disease

## 2015-04-06 DIAGNOSIS — F411 Generalized anxiety disorder: Secondary | ICD-10-CM | POA: Diagnosis not present

## 2015-04-19 ENCOUNTER — Encounter: Payer: Self-pay | Admitting: Cardiovascular Disease

## 2015-04-19 ENCOUNTER — Ambulatory Visit (INDEPENDENT_AMBULATORY_CARE_PROVIDER_SITE_OTHER): Payer: Medicare Other | Admitting: Cardiovascular Disease

## 2015-04-19 VITALS — BP 160/104 | HR 37 | Ht 71.0 in | Wt 210.4 lb

## 2015-04-19 DIAGNOSIS — I2510 Atherosclerotic heart disease of native coronary artery without angina pectoris: Secondary | ICD-10-CM

## 2015-04-19 DIAGNOSIS — I4891 Unspecified atrial fibrillation: Secondary | ICD-10-CM | POA: Diagnosis not present

## 2015-04-19 NOTE — Patient Instructions (Signed)
Medication Instructions:  Your physician recommends that you continue on your current medications as directed. Please refer to the Current Medication list given to you today.   Labwork: None Ordered   Testing/Procedures: None Ordered   Follow-Up: Your physician wants you to follow-up in: 6 months with Dr. Nahser.  You will receive a reminder letter in the mail two months in advance. If you don't receive a letter, please call our office to schedule the follow-up appointment.   If you need a refill on your cardiac medications before your next appointment, please call your pharmacy.   Thank you for choosing [facility]! Aslin Farinas, RN [phone]    

## 2015-04-19 NOTE — Progress Notes (Signed)
Benjamin [name] Date of Birth: 01-Apr-1945 Medical Record #[id number]   Problem List: 1. CAD - DES to RCA and prox LCx.  (10/16/12) 2. Paroxysmal Atrial fib: 3. Non-small cell lung ca. 4. Leg edema   History of Present Illness: Benjamin [name] is seen back today for a post hospital visit. Seen for Dr. Acie Fredrickson. Has CAD with known disease and prior LCX occlusion per cath in 2001 - follow up cath in 2008 showed recanalization of the lCX and otherwise nonobstructive disease, PAF, anxiety, mobitz type 1 second degree AV block - not on beta blocker therapy. Has also had lung cancer in the past treated with surgery and XRT.   Has had recent NSTEMI and had PCI/DES of the RCA and LCX. EF is 50 to 55% per cath. Was noted to have PAF during this admission - not currently on anticoagulation in the setting of dual antiplatelet therapy but this need to be addressed in the future. His CHADSVASc = 2.   Comes in today. Here with his wife. He is doing ok. Has been home about 5 weeks. Had a little chest discomfort initially - none over the past 3 weeks - no NTG use. Feels ok. Mostly limited by his breathing due to his lung cancer history/treatment. No bleeding or bruising. Not fasting today.   Nov. 3, 2014:  Jul 30, 2013:  Benjamin [name] is doing ok.  Has had some anxiety . Rare CP. Typically at rest.  Mild arm discomfort with walking .  He has had lots of lung surgeries ( non-small cell CA) and is limited by dyspnea.    August 20, 2013:  Benjamin [name] is doing well.  His left arm pain has improved.  His myoview was intermediate risk. - he has a scar in the Lcx distribution with possible periinfarct ischimia.  He had significant uptake in the bowel which made interpretation of the myoview difficult   He is chronically short of breath from his multiple lung surgeries. He really is not having any symptoms of angina.  Dec. 15, 2015:  Benjamin [name] who I follow for CAD, paroxysmal A-fib, lung cancer - s/p multiple surgeries,    His HR is slow but no episodes of syncope or presyncope Has some ankle  swelling at the end of the day.  He avoids salt.    September 14, 2014:  Doing well from a cardiac standpoint . Has some chronic ankle  edema .  Resolves during night  Has had chronic right ankle edema for years   Jan. 30, 2017:  Benjamin [name] is doing OK . is back in atrial fib - asymptomatic.   Has lung cancer.   Recent reports have been good .   Has had a slow HR for years   Current Outpatient Prescriptions  Medication Sig Dispense Refill  . aspirin EC 81 MG EC tablet Take 1 tablet (81 mg total) by mouth daily.    [name] atorvastatin (LIPITOR) 20 MG tablet Take 20 mg by mouth daily.    . clonazePAM (KLONOPIN) 1 MG tablet Take 1 mg by mouth 3 (three) times daily.     . nitroGLYCERIN (NITROSTAT) 0.4 MG SL tablet Place 1 tablet (0.4 mg total) under the tongue every 5 (five) minutes x 3 doses as needed for chest pain. 25 tablet 3   No current facility-administered medications for this visit.    Allergies  Allergen Reactions  . Codeine Other (See Comments)    Dizziness, stomach pain  Past Medical History  Diagnosis Date  . Adenopathy     RIGHT ADRENAL  . Anxiety   . Arthritis     KNEES/HANDS  . Hx of melanoma of skin   . PAF (paroxysmal atrial fibrillation) ([place])     a. not currently on anticoagulation (09/2012)  . Non-small cell lung cancer ([place])     a. s/p resection and XRT.  [name] CAD (coronary artery disease)     a. Prior known CTO of LCX with recanalizationby cath 2008;  b. 09/2012 NSTEMI/Cath/PCI: LM nl, LAD 22m D1 50p, LCX 100p (2.75x16 Promus Premier DES), 80-962mOM1 30, RCA 9033m.0x16 Promus Premier DES), EF 50-55%.    Past Surgical History  Procedure Laterality Date  . Lobectomy left upper lobe  08/15/2001  . Adrenalextomy  02/2000  . Right vats, mini thoracotomy, wedge resection of right upper  01/18/2004  . Cardiac catheterization  2008    LAD 40, D1 OK, D2 50, CFX 90, RCA OK, EF 60%; Consider PCI  PRN but the stenosis is quite complex and we would end up jailing several large posterolateral branches    . Left heart catheterization with coronary angiogram N/A 10/17/2012    Procedure: LEFT HEART CATHETERIZATION WITH CORONARY ANGIOGRAM;  Surgeon: MicSherren MochaD;  [facility];  Service: Cardiovascular;  Laterality: N/A;    History  Smoking status  . Former Smoker -- 40 13ars  . Quit date: 08/15/2001  Smokeless tobacco  . Not on file    History  Alcohol Use No    Family History  Problem Relation Age of Onset  . Heart attack Father     Review of Systems: The review of systems is per the HPI.  All other systems were reviewed and are negative.  Physical Exam: BP 160/104 mmHg  Pulse 37  Ht '5\' 11"'$  (1.803 m)  Wt 210 lb 6.4 oz (95.437 kg)  BMI 29.36 kg/m2 The patient is alert and oriented x 3.  The mood and affect are normal.  The skin is warm and dry.  Color is normal.   HEENT exam reveals that the sclera are nonicteric.  The mucous membranes are moist.  The carotids are 2+ without bruits.  There is no thyromegaly.  There is no JVD.   Lungs are clear.  The chest wall is non tender.   Cor:   reveals a regular rate with a normal S1 and S2.  .  Marland KitchenHR is slow.   There are no murmurs, gallops, or rubs.  The PMI is not displaced.    Abdominal exam reveals good bowel sounds.  There is no guarding or rebound.  There is no hepatosplenomegaly or tenderness.  There are no masses.  Exam of the legs reveal no clubbing, cyanosis, 2 + edema on right.  Trace edema on left .  The legs are without rashes.  The distal pulses are intact.   Cranial nerves II - XII are intact.  Motor and sensory functions are intact.  The gait is normal.   LABORATORY DATA: CBC/BMET and EKG pending  Lab Results  Component Value Date   WBC 6.2 11/22/2012   HGB 14.5 11/22/2012   HCT 43.6 11/22/2012   PLT 141.0* 11/22/2012   GLUCOSE 97 09/14/2014   CHOL 114 09/14/2014   TRIG 102.0 09/14/2014   HDL  40.60 09/14/2014   LDLCALC 53 09/14/2014   ALT 17 09/14/2014   AST 17 09/14/2014   NA 139 09/14/2014   K  4.1 09/14/2014   CL 105 09/14/2014   CREATININE 0.98 09/14/2014   BUN 12 09/14/2014   CO2 31 09/14/2014   TSH 0.774 10/16/2012   INR 1.08 10/16/2012   HGBA1C 5.5 10/16/2012    Lab Results  Component Value Date   TROPONINI 15.20* 10/17/2012    Angiographic Findings:  10/16/12  Left main: No obstructive disease.  Left Anterior Descending Artery: Large caliber vessel that courses to the apex. The mid vessel has diffuse 50% stenosis. There are several diagonal branches. The first diagonal branch is moderate in caliber with proximal 50% stenosis. The second and third diagonal branches are small in caliber with mild proximal disease.  Circumflex Artery: 100% proximal occlusion after a very early moderate caliber OM branch. The first OM branch has diffuse 30% stenosis. After PCI, the mid AV groove Circumflex is seen to fill three OM branches. The AV groove Circumflex has diffuse 80-90% stenosis between the takeoff of the second and third OM branches with same appearance as the cath in 2008.  Right Coronary Artery: Large caliber dominant vessel with hazy 90% mid stenosis with appearance of unstable plaque.   Left Ventricular Angiogram: LVEF=50-55%.   Impression:  1. Triple vessel CAD with NSTEMI secondary to acute occlusion of Circumflex.  2. Severe stenosis, unstable plaque mid RCA  3. NSTEMI  4. Preserved LV systolic function  5. Successful PTCA/DES x 1 mid RCA  6. Successful PTCA/DES x 1 proximal Circumflex   Recommendations: He will need dual anti-platelet therapy with ASA and Effient for one year. Continue statin. No beta blocker with heart block.   Complications: None. The patient tolerated the procedure well.   ECG  Jan. 30, 2017:  Atrial fib with slow V response   37 bpm .   Assessment / Plan:   1. CAD - DES to RCA and prox LCx.  (10/16/12) -  No angina , continue  same meds   2. Paroxysmal Atrial fib: - has remained in NSR   3. Non-small cell lung ca.  4. Leg edema - will get a venous doppler to r/u DVT .   He has had this edema for years.  The edema seems to be especially severe in his ankles.   5. Hyperlipidemia:  Continue atorvastatin.  His primary medical doctor has lowered the dose to 20 mg a day . Will recheck lipids today .  6. Atrial Fib:     Has a slow ventricular response.   His HR increased up to 60 with a 45 second step test.   7. Bradycardia:   HR is very slow but he has no symptoms .  Able to increase his HR with a step test. Was able to bring in a case of bottled water without any problems .  We'll see him again in 6 month.  Benjamin [name], Wonda Cheng, MD  04/19/2015 10:51 AM    [address],  [place], [place]  [address] Pager [phone] Phone: [phone]; Fax: [phone]   [facility]  [address] [place], [place]  [address] [phone]    Fax [phone]

## 2015-04-22 DIAGNOSIS — L812 Freckles: Secondary | ICD-10-CM | POA: Diagnosis not present

## 2015-04-22 DIAGNOSIS — L821 Other seborrheic keratosis: Secondary | ICD-10-CM | POA: Diagnosis not present

## 2015-04-22 DIAGNOSIS — Z85820 Personal history of malignant melanoma of skin: Secondary | ICD-10-CM | POA: Diagnosis not present

## 2015-05-04 DIAGNOSIS — Z1389 Encounter for screening for other disorder: Secondary | ICD-10-CM | POA: Diagnosis not present

## 2015-05-04 DIAGNOSIS — D696 Thrombocytopenia, unspecified: Secondary | ICD-10-CM | POA: Diagnosis not present

## 2015-05-04 DIAGNOSIS — Z79899 Other long term (current) drug therapy: Secondary | ICD-10-CM | POA: Diagnosis not present

## 2015-05-04 DIAGNOSIS — F411 Generalized anxiety disorder: Secondary | ICD-10-CM | POA: Diagnosis not present

## 2015-05-04 DIAGNOSIS — Z0000 Encounter for general adult medical examination without abnormal findings: Secondary | ICD-10-CM | POA: Diagnosis not present

## 2015-05-04 DIAGNOSIS — E7800 Pure hypercholesterolemia, unspecified: Secondary | ICD-10-CM | POA: Diagnosis not present

## 2015-05-04 DIAGNOSIS — I480 Paroxysmal atrial fibrillation: Secondary | ICD-10-CM | POA: Diagnosis not present

## 2015-06-11 ENCOUNTER — Other Ambulatory Visit: Payer: Self-pay | Admitting: Thoracic Surgery (Cardiothoracic Vascular Surgery)

## 2015-06-11 DIAGNOSIS — C3490 Malignant neoplasm of unspecified part of unspecified bronchus or lung: Secondary | ICD-10-CM

## 2015-07-13 ENCOUNTER — Ambulatory Visit
Admission: RE | Admit: 2015-07-13 | Discharge: 2015-07-13 | Disposition: A | Discharge status: Home / Self Care | Payer: Medicare Other | Source: Ambulatory Visit | Attending: Thoracic Surgery (Cardiothoracic Vascular Surgery) | Admitting: Thoracic Surgery (Cardiothoracic Vascular Surgery)

## 2015-07-13 ENCOUNTER — Ambulatory Visit: Payer: Medicare Other | Admitting: Thoracic Surgery (Cardiothoracic Vascular Surgery)

## 2015-07-13 DIAGNOSIS — C3490 Malignant neoplasm of unspecified part of unspecified bronchus or lung: Secondary | ICD-10-CM

## 2015-07-13 DIAGNOSIS — R911 Solitary pulmonary nodule: Secondary | ICD-10-CM | POA: Diagnosis not present

## 2015-08-03 ENCOUNTER — Ambulatory Visit: Payer: Medicare Other | Admitting: Thoracic Surgery (Cardiothoracic Vascular Surgery)

## 2015-08-10 ENCOUNTER — Encounter: Payer: Self-pay | Admitting: Thoracic Surgery (Cardiothoracic Vascular Surgery)

## 2015-08-10 ENCOUNTER — Other Ambulatory Visit: Payer: Self-pay | Admitting: *Deleted

## 2015-08-10 ENCOUNTER — Ambulatory Visit (INDEPENDENT_AMBULATORY_CARE_PROVIDER_SITE_OTHER): Payer: Medicare Other | Admitting: Thoracic Surgery (Cardiothoracic Vascular Surgery)

## 2015-08-10 VITALS — BP 188/85 | HR 45 | Resp 20 | Ht 71.0 in | Wt 210.0 lb

## 2015-08-10 DIAGNOSIS — I2510 Atherosclerotic heart disease of native coronary artery without angina pectoris: Secondary | ICD-10-CM | POA: Diagnosis not present

## 2015-08-10 DIAGNOSIS — Z85118 Personal history of other malignant neoplasm of bronchus and lung: Secondary | ICD-10-CM

## 2015-08-10 DIAGNOSIS — R911 Solitary pulmonary nodule: Secondary | ICD-10-CM

## 2015-08-10 NOTE — Progress Notes (Signed)
[address]       [place] [address]             [phone]       HPI: Benjamin [name] returns for a scheduled 1 year follow-up visit.  He is a 70 year old gentleman with a history of multiple lung cancers. 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 He had a right upper lobectomy in 2005 for a second non-small cell carcinoma. Both surgeries were done by Benjamin [name]. He then developed a third non-small cell cancer in the left lower lobe in 2009. That was treated with radiation. When Benjamin [name] retired I began following Benjamin [name].  A CT in 2014 showed chronic changes on the left from radiation and a groundglass opacity in the right lung which was unchanged. The groundglass opacity remained stable last saw him in April 2016.  In the interim since his last visit he says he's been feeling reasonably well. He has not had any new respiratory issues. He has chronic shortness of breath with exertion. He denies wheezing. He does occasionally have chest pain, but never enough to have to take nitroglycerin. Benjamin [name] follows him for his coronary disease. He saw him earlier this year. His appetite is good. He denies weight loss. He has not had any unusual headaches or visual changes. He has an occasional cough, denies hemoptysis. .  Past Medical History  Diagnosis Date  . Adenopathy     RIGHT ADRENAL  . Anxiety   . Arthritis     KNEES/HANDS  . Hx of melanoma of skin   . PAF (paroxysmal atrial fibrillation) ([place])     a. not currently on anticoagulation (09/2012)  . Non-small cell lung cancer ([place])     a. s/p resection and XRT.  [name] CAD (coronary artery disease)     a. Prior known CTO of LCX with recanalizationby cath 2008;  b. 09/2012 NSTEMI/Cath/PCI: LM nl, LAD 63m D1 50p, LCX 100p (2.75x16 Promus Premier DES), 80-963mOM1 30, RCA 9028m.0x16 Promus Premier DES), EF 50-55%.      Current Outpatient Prescriptions  Medication Sig Dispense Refill  .  aspirin EC 81 MG EC tablet Take 1 tablet (81 mg total) by mouth daily.    . aMarland Kitchenorvastatin (LIPITOR) 20 MG tablet Take 20 mg by mouth daily.    . clonazePAM (KLONOPIN) 1 MG tablet Take 1 mg by mouth 3 (three) times daily.     . nitroGLYCERIN (NITROSTAT) 0.4 MG SL tablet Place 1 tablet (0.4 mg total) under the tongue every 5 (five) minutes x 3 doses as needed for chest pain. 25 tablet 3   No current facility-administered medications for this visit.    Physical Exam BP 188/85 mmHg  Pulse 45  Resp 20  Ht '5\' 11"'$  (1.803 m)  Wt 210 lb (95.255 kg)  BMI 29.30 kg/m2  SpO2 927%342 89ar old man in no acute distress Alert and oriented 3 with no focal neurologic deficits No cervical or supraclavicular adenopathy Cardiac bradycardic slightly irregular Lungs with diminished breath sounds bilaterally, no wheezing 2+ edema both ankles  Diagnostic Tests: CT CHEST WITHOUT CONTRAST  TECHNIQUE: Multidetector CT imaging of the chest was performed following the standard protocol without IV contrast.  COMPARISON: 07/07/2014, 07/15/2013, dating back to 08/19/2007.  FINDINGS: Cardiovascular: Heart mildly enlarged, unchanged. Three vessel coronary atherosclerosis with stents in the right coronary and left circumflex coronary arteries. No pericardial effusion. Mild to moderate  atherosclerosis involving the thoracic and upper abdominal aorta without aneurysm.  Mediastinum/Lymph Nodes: Multiple old calcified bilateral hilar and mediastinal lymph nodes as noted previously. No pathologic lymphadenopathy. Very small hiatal hernia, unchanged. Esophagus normal in appearance. Normal-appearing thyroid gland.  Lungs/Pleura: Interval slight increase in size of the ground glass opacity/nodule in the right lower lobe, current measurements approximating 3.2 x 3.1 cm (previously 2.9 x 2.6 cm). No new solid, semi-solid or ground-glass nodules elsewhere in either lung.  Left upper lobectomy. 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 Emphysematous changes throughout both lungs. Post surgical scarring involving the right upper lobe at the site of prior tumor resection. Blebs or air cysts in the right lower lobe. Calcified granuloma deep in the posterior right lower lobe. No pleural effusions. No pleural masses.  Upper abdomen: Surgical clips adjacent to the right lobe of liver and in the retrocaval portion of the retroperitoneum. Subcentimeter cyst in the lateral segment left lobe of liver, unchanged. No acute abnormalities involving the visualized upper abdomen.  Musculoskeletal: Degenerative disc disease and spondylosis involving the lower thoracic and upper lumbar spine. Osseous demineralization. Lower cervical spine degenerative disc disease and spondylosis.  IMPRESSION: 1. Interval slight increase in size of the ground-glass nodule in the right lower lobe since the examination 1 year ago, measurements given above. 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 2. No new nodules elsewhere in either lung. 3. 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 4. COPD/emphysema. No acute cardiopulmonary disease.   Electronically Signed  By: Benjamin [name] M.D.  On: 07/13/2015 14:27  I personally reviewed the CT chest and concur with the findings as noted above   Impression: 70 year old former smoker with a history of 3 prior lung cancers who has a groundglass opacity in the right lower lobe. This had remained stable over several years, however over the past year has grown slightly in size. There is still no solid component and it is a pure groundglass opacity. Given his history and the appearance of the lesion, this is highly suspicious for a low-grade adenocarcinoma.  Since this has grown over the past year I do think it should be further investigated at this time.  I recommended that we proceed with a PET/CT to assess for any metabolic activity in the nodule and also rule out issues elsewhere. This is a relatively peripheral nodule I think should be accessible with a CT-guided biopsy. There is not a great airway going to the lesion on CT which would make navigational bronchoscopy more difficult. However, I think would be reasonable to attempt that if CT-guided biopsy is not feasible.  He is not a good candidate for surgical intervention but could potentially be a candidate for stereotactic radiation or conventional radiation if the nodule turns out to be an adenocarcinoma.  His blood pressure was markedly elevated today. He says that he is checking at home and it is typically in the 681E systolic. He will continue to check that over the next several days and will contact Benjamin [name] if it remains elevated.   Plan: PET/CT for staging CT-guided biopsy of right lower lobe groundglass opacity. Return in 2 weeks to discuss results  Melrose Nakayama, MD [facility] [phone]

## 2015-08-18 ENCOUNTER — Ambulatory Visit (HOSPITAL_COMMUNITY): Payer: Medicare Other

## 2015-08-20 ENCOUNTER — Encounter (HOSPITAL_COMMUNITY): Payer: Medicare Other

## 2015-08-23 ENCOUNTER — Encounter (HOSPITAL_COMMUNITY)
Admission: RE | Admit: 2015-08-23 | Discharge: 2015-08-23 | Disposition: A | Discharge status: Home / Self Care | Payer: Medicare Other | Source: Ambulatory Visit | Attending: Thoracic Surgery (Cardiothoracic Vascular Surgery) | Admitting: Thoracic Surgery (Cardiothoracic Vascular Surgery)

## 2015-08-23 ENCOUNTER — Encounter (HOSPITAL_COMMUNITY): Payer: Medicare Other

## 2015-08-23 ENCOUNTER — Other Ambulatory Visit: Payer: Self-pay | Admitting: Radiology

## 2015-08-23 DIAGNOSIS — C3491 Malignant neoplasm of unspecified part of right bronchus or lung: Secondary | ICD-10-CM | POA: Diagnosis not present

## 2015-08-23 DIAGNOSIS — R911 Solitary pulmonary nodule: Secondary | ICD-10-CM

## 2015-08-23 LAB — GLUCOSE, CAPILLARY: GLUCOSE-CAPILLARY: 102 mg/dL — AB (ref 65–99)

## 2015-08-23 MED ORDER — FLUDEOXYGLUCOSE F - 18 (FDG) INJECTION
10.4000 | Freq: Once | INTRAVENOUS | Status: AC | PRN
  Administered 2015-08-23: 10.4 via INTRAVENOUS

## 2015-08-24 ENCOUNTER — Inpatient Hospital Stay (HOSPITAL_COMMUNITY)
Admission: RE | Admit: 2015-08-24 | Discharge: 2015-08-28 | DRG: 200 | Disposition: A | Discharge status: Home / Self Care | Payer: Medicare Other | Source: Ambulatory Visit | Attending: Interventional Radiology | Admitting: Interventional Radiology

## 2015-08-24 ENCOUNTER — Encounter (HOSPITAL_COMMUNITY): Payer: Self-pay

## 2015-08-24 ENCOUNTER — Encounter: Payer: Medicare Other | Admitting: Thoracic Surgery (Cardiothoracic Vascular Surgery)

## 2015-08-24 ENCOUNTER — Ambulatory Visit (HOSPITAL_COMMUNITY)
Admission: RE | Admit: 2015-08-24 | Discharge: 2015-08-24 | Disposition: A | Discharge status: Home / Self Care | Payer: Medicare Other | Source: Ambulatory Visit | Attending: Interventional Radiology | Admitting: Interventional Radiology

## 2015-08-24 VITALS — BP 147/72 | HR 48 | Temp 98.0°F | Resp 17 | Ht 71.0 in | Wt 212.0 lb

## 2015-08-24 DIAGNOSIS — R918 Other nonspecific abnormal finding of lung field: Secondary | ICD-10-CM | POA: Diagnosis present

## 2015-08-24 DIAGNOSIS — Z85118 Personal history of other malignant neoplasm of bronchus and lung: Secondary | ICD-10-CM

## 2015-08-24 DIAGNOSIS — I2510 Atherosclerotic heart disease of native coronary artery without angina pectoris: Secondary | ICD-10-CM | POA: Diagnosis present

## 2015-08-24 DIAGNOSIS — I480 Paroxysmal atrial fibrillation: Secondary | ICD-10-CM | POA: Diagnosis present

## 2015-08-24 DIAGNOSIS — Z87891 Personal history of nicotine dependence: Secondary | ICD-10-CM

## 2015-08-24 DIAGNOSIS — R911 Solitary pulmonary nodule: Secondary | ICD-10-CM | POA: Diagnosis not present

## 2015-08-24 DIAGNOSIS — J449 Chronic obstructive pulmonary disease, unspecified: Secondary | ICD-10-CM | POA: Diagnosis present

## 2015-08-24 DIAGNOSIS — D0221 Carcinoma in situ of right bronchus and lung: Secondary | ICD-10-CM | POA: Diagnosis not present

## 2015-08-24 DIAGNOSIS — Z923 Personal history of irradiation: Secondary | ICD-10-CM

## 2015-08-24 DIAGNOSIS — Z85820 Personal history of malignant melanoma of skin: Secondary | ICD-10-CM

## 2015-08-24 DIAGNOSIS — J939 Pneumothorax, unspecified: Secondary | ICD-10-CM

## 2015-08-24 DIAGNOSIS — S270XXA Traumatic pneumothorax, initial encounter: Secondary | ICD-10-CM

## 2015-08-24 DIAGNOSIS — T797XXA Traumatic subcutaneous emphysema, initial encounter: Secondary | ICD-10-CM | POA: Diagnosis present

## 2015-08-24 DIAGNOSIS — Y813 Surgical instruments, materials and general- and plastic-surgery devices (including sutures) associated with adverse incidents: Secondary | ICD-10-CM | POA: Diagnosis present

## 2015-08-24 DIAGNOSIS — Z9889 Other specified postprocedural states: Secondary | ICD-10-CM

## 2015-08-24 DIAGNOSIS — Y848 Other medical procedures as the cause of abnormal reaction of the patient, or of later complication, without mention of misadventure at the time of the procedure: Secondary | ICD-10-CM | POA: Diagnosis present

## 2015-08-24 DIAGNOSIS — J95811 Postprocedural pneumothorax: Principal | ICD-10-CM

## 2015-08-24 DIAGNOSIS — Z7982 Long term (current) use of aspirin: Secondary | ICD-10-CM

## 2015-08-24 LAB — CBC
HCT: 50.3 % (ref 39.0–52.0)
Hemoglobin: 16.8 g/dL (ref 13.0–17.0)
MCH: 29.8 pg (ref 26.0–34.0)
MCHC: 33.4 g/dL (ref 30.0–36.0)
MCV: 89.2 fL (ref 78.0–100.0)
PLATELETS: 134 10*3/uL — AB (ref 150–400)
RBC: 5.64 MIL/uL (ref 4.22–5.81)
RDW: 13.2 % (ref 11.5–15.5)
WBC: 5.8 10*3/uL (ref 4.0–10.5)

## 2015-08-24 LAB — PROTIME-INR
INR: 1.11 (ref 0.00–1.49)
Prothrombin Time: 14.5 seconds (ref 11.6–15.2)

## 2015-08-24 LAB — APTT: APTT: 35 s (ref 24–37)

## 2015-08-24 MED ORDER — MIDAZOLAM HCL 2 MG/2ML IJ SOLN
INTRAMUSCULAR | Status: AC | PRN
  Administered 2015-08-24: 0.5 mg via INTRAVENOUS
  Administered 2015-08-24: 1 mg via INTRAVENOUS
  Administered 2015-08-24: 0.5 mg via INTRAVENOUS

## 2015-08-24 MED ORDER — ATROPINE SULFATE 1 MG/10ML IJ SOSY
PREFILLED_SYRINGE | INTRAMUSCULAR | Status: AC
  Filled 2015-08-24: qty 10

## 2015-08-24 MED ORDER — FENTANYL CITRATE (PF) 100 MCG/2ML IJ SOLN
INTRAMUSCULAR | Status: AC
  Filled 2015-08-24: qty 2

## 2015-08-24 MED ORDER — ASPIRIN EC 81 MG PO TBEC
81.0000 mg | DELAYED_RELEASE_TABLET | Freq: Every day | ORAL | Status: DC
Start: 2015-08-24 — End: 2015-08-28
  Administered 2015-08-25 – 2015-08-28 (×4): 81 mg via ORAL
  Filled 2015-08-24 (×6): qty 1

## 2015-08-24 MED ORDER — MIDAZOLAM HCL 2 MG/2ML IJ SOLN
INTRAMUSCULAR | Status: AC
  Filled 2015-08-24: qty 4

## 2015-08-24 MED ORDER — NITROGLYCERIN 0.4 MG SL SUBL: 0.4000 mg | SUBLINGUAL_TABLET | SUBLINGUAL | Status: DC | PRN

## 2015-08-24 MED ORDER — LIDOCAINE HCL 1 % IJ SOLN
INTRAMUSCULAR | Status: AC
  Filled 2015-08-24: qty 20

## 2015-08-24 MED ORDER — CLONAZEPAM 1 MG PO TABS
1.0000 mg | ORAL_TABLET | Freq: Three times a day (TID) | ORAL | Status: DC
  Administered 2015-08-24 – 2015-08-28 (×11): 1 mg via ORAL
  Filled 2015-08-24 (×13): qty 1

## 2015-08-24 MED ORDER — SODIUM CHLORIDE 0.9 % IV SOLN
INTRAVENOUS | Status: AC | PRN
  Administered 2015-08-24: 10 mL/h via INTRAVENOUS

## 2015-08-24 MED ORDER — SODIUM CHLORIDE 0.9 % IV SOLN: Freq: Once | INTRAVENOUS | Status: DC

## 2015-08-24 MED ORDER — MIDAZOLAM HCL 5 MG/5ML IJ SOLN
INTRAMUSCULAR | Status: AC | PRN
  Administered 2015-08-24: 0.5 mg via INTRAVENOUS

## 2015-08-24 MED ORDER — FENTANYL CITRATE (PF) 100 MCG/2ML IJ SOLN
INTRAMUSCULAR | Status: AC | PRN
  Administered 2015-08-24 (×3): 25 ug via INTRAVENOUS

## 2015-08-24 NOTE — Sedation Documentation (Signed)
Continue to monitor pt placed on R side

## 2015-08-24 NOTE — Progress Notes (Signed)
Patient ID: Benjamin [name], male   DOB: 01/20/1946, 70 y.o.   MRN: [id number] Patient with a small apical PTX after lung biopsy today.  Please see earlier H&P for further details.  The patient is currently asymptomatic and his vitals are stable.  His O2 sat is 100%.  He denies chest pain or shortness of breath.  We will admit him overnight for observation and plan for repeat CXR in am at 0600 if he does not develop any worsening symptoms overnight.  This was discussed with the patient and his wife who are agreeable.    Wenda Vanschaick E 3:54 PM 08/24/2015

## 2015-08-24 NOTE — Discharge Instructions (Signed)

## 2015-08-24 NOTE — H&P (Signed)
Chief Complaint: Patient was seen in consultation today for right lung mass biopsy at the request of Hendrickson,Steven C  Referring Physician(s): [place] C  Supervising Physician: Corrie Mckusick  Patient Status: Out pt  History of Present Illness: Benjamin [name] is a 70 y.o. male   Pt with Hx Non small cell Lung Ca 3 other times LUL resection 2003; RUL resection 2005; LLL 2009- Radiation Now noted RLL mass x 1 yr- followed with Dr Roxan Hockey CT 06/2015 revealed increase size 08/23/15 PET: IMPRESSION: 1. Interval slight increase in size of the ground-glass nodule in the right lower lobe since the examination 1 year ago, measurements given above. 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 2. No new nodules elsewhere in either lung. 3. 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 4. COPD/emphysema. No acute cardiopulmonary disease.  Scheduled now for biopsy of Rt lung mass per Dr Roxan Hockey  Past Medical History  Diagnosis Date  . Adenopathy     RIGHT ADRENAL  . Anxiety   . Arthritis     KNEES/HANDS  . Hx of melanoma of skin   . PAF (paroxysmal atrial fibrillation) ([place])     a. not currently on anticoagulation (09/2012)  . Non-small cell lung cancer ([place])     a. s/p resection and XRT.  [name] CAD (coronary artery disease)     a. Prior known CTO of LCX with recanalizationby cath 2008;  b. 09/2012 NSTEMI/Cath/PCI: LM nl, LAD 74m D1 50p, LCX 100p (2.75x16 Promus Premier DES), 80-991mOM1 30, RCA 9010m.0x16 Promus Premier DES), EF 50-55%.    Past Surgical History  Procedure Laterality Date  . Lobectomy left upper lobe  08/15/2001  . Adrenalextomy  02/2000  . Right vats, mini thoracotomy, wedge resection of right upper  01/18/2004  . Cardiac catheterization  2008    LAD 40, D1 OK, D2 50, CFX 90, RCA OK, EF 60%; Consider PCI PRN but the stenosis is quite  complex and we would end up jailing several large posterolateral branches    . Left heart catheterization with coronary angiogram N/A 10/17/2012    Procedure: LEFT HEART CATHETERIZATION WITH CORONARY ANGIOGRAM;  Surgeon: MicSherren MochaD;  [facility];  Service: Cardiovascular;  Laterality: N/A;    Allergies: Codeine  Medications: Prior to Admission medications   Medication Sig Start Date End Date Taking? Authorizing Provider  aspirin EC 81 MG EC tablet Take 1 tablet (81 mg total) by mouth daily. 10/18/12  Yes ChrRogelia MireP  atorvastatin (LIPITOR) 20 MG tablet Take 20 mg by mouth daily at 6 PM.    Yes Historical Provider, MD  clonazePAM (KLONOPIN) 1 MG tablet Take 1 mg by mouth 3 (three) times daily.    Yes Historical Provider, MD  nitroGLYCERIN (NITROSTAT) 0.4 MG SL tablet Place 1 tablet (0.4 mg total) under the tongue every 5 (five) minutes x 3 doses as needed for chest pain. 10/18/12  Yes ChrRogelia MireP     Family History  Problem Relation Age of Onset  . Heart attack Father     Social History   Social History  . Marital Status: Divorced    Spouse Name: N/A  . Number of Children: N/A  . Years of Education: N/A   Occupational History  . Retired [employer]   Social History Main Topics  . Smoking status: Former Smoker -- 40 years  Quit date: 08/15/2001  . Smokeless tobacco: None  . Alcohol Use: No  . Drug Use: No  . Sexual Activity: Not Currently   Other Topics Concern  . None   Social History Narrative    Review of Systems: A 12 point ROS discussed and pertinent positives are indicated in the HPI above.  All other systems are negative.  Review of Systems  Constitutional: Negative for fever, activity change, appetite change and fatigue.  Respiratory: Negative for cough and shortness of breath.   Neurological: Negative for weakness.  Psychiatric/Behavioral: Negative for behavioral problems and confusion.    Vital Signs: BP  170/83 mmHg  Pulse 40  Temp(Src) 97.5 F (36.4 C) (Oral)  Resp 15  Ht '5\' 11"'$  (1.803 m)  Wt 212 lb (96.163 kg)  BMI 29.58 kg/m2  SpO2 100%  Physical Exam  Constitutional: He is oriented to person, place, and time.  Cardiovascular: Normal rate, regular rhythm and normal heart sounds.   Pulmonary/Chest: Effort normal and breath sounds normal. He has no wheezes.  Abdominal: Soft. Bowel sounds are normal. There is no tenderness.  Musculoskeletal: Normal range of motion. He exhibits edema.  B ankle edema  Neurological: He is alert and oriented to person, place, and time.  Skin: Skin is warm and dry.  Psychiatric: He has a normal mood and affect. His behavior is normal. Judgment and thought content normal.  Nursing note and vitals reviewed.   Mallampati Score:  MD Evaluation Airway: WNL Heart: WNL Abdomen: WNL Chest/ Lungs: WNL ASA  Classification: 3 Mallampati/Airway Score: One  Imaging: Nm Pet Image Initial (pi) Skull Base To Thigh  08/23/2015  CLINICAL DATA:  Subsequent treatment strategy for lung cancer. EXAM: NUCLEAR MEDICINE PET SKULL BASE TO THIGH TECHNIQUE: 10 point for mCi F-18 FDG was injected intravenously. Full-ring PET imaging was performed from the skull base to thigh after the radiotracer. CT data was obtained and used for attenuation correction and anatomic localization. FASTING BLOOD GLUCOSE:  Value: 102 mg/dl COMPARISON:  CT chest 07/13/2015, of 07/15/2013 and 07/02/2012 and PET 06/28/2009. FINDINGS: NECK No hypermetabolic lymph nodes in the neck. CT images show no acute findings. CHEST Ground-glass nodule in the right lower lobe does show subtle FDG avidity, with an SUV max of 0.9. It is difficult to measure the lesion due to respiratory motion. Posttreatment consolidation in the upper left hemi thorax is mildly hypermetabolic, as expected. No hypermetabolic mediastinal, hilar or axillary lymph nodes. CT images show no pericardial or pleural effusion. Three-vessel  coronary artery calcification. ABDOMEN/PELVIS No abnormal hypermetabolic activity within the liver, pancreas, adrenal glands, or spleen. No hypermetabolic lymph nodes in the abdomen or pelvis. CT images show a sub cm low-attenuation lesion in the dome of the liver, too small to characterize. Liver, gallbladder otherwise unremarkable. Right adrenalectomy. Left adrenal gland, and kidneys, spleen, pancreas, stomach and bowel are grossly unremarkable. Prostate is slightly enlarged. SKELETON No abnormal osseous hypermetabolism. Degenerative changes are seen in the right hip and spine. 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 IMPRESSION: 1. Ground-glass right lower lobe nodule shows subtle FDG avidity. 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 2. Postsurgical and post treatment changes in the left hemi thorax, stable. 3. Three-vessel coronary artery calcification. 4. Prostate is enlarged. Electronically Signed   By: Lorin Picket M.D.   On: 08/23/2015 13:00    Labs:  CBC:  Recent Labs  08/24/15 1030  WBC 5.8  HGB 16.8  HCT 50.3  PLT 134*    COAGS:  Recent Labs  08/24/15 1030  INR 1.11  APTT 35    BMP:  Recent Labs  09/14/14 1159  NA 139  K 4.1  CL 105  CO2 31  GLUCOSE 97  BUN 12  CALCIUM 9.0  CREATININE 0.98    LIVER FUNCTION TESTS:  Recent Labs  09/14/14 1159  BILITOT 0.8  AST 17  ALT 17  ALKPHOS 64  PROT 6.8  ALBUMIN 4.0    TUMOR MARKERS: No results for input(s): AFPTM, CEA, CA199, CHROMGRNA in the last 8760 hours.  Assessment and Plan:  Hx NSC Lung Ca x 3 Now with RLL mass increasing in size and +PET For bx today Risks and Benefits discussed with the patient including, but not limited to bleeding, hemoptysis, respiratory failure requiring intubation, infection, pneumothorax requiring chest tube placement, stroke from air embolism or even death. All of the  patient's questions were answered, patient is agreeable to proceed. Consent signed and in chart.   Thank you for this interesting consult.  I greatly enjoyed meeting CHRISHAUN SASSO and look forward to participating in their care.  A copy of this report was sent to the requesting provider on this date.  Electronically Signed: Taneika Choi A 08/24/2015, 11:14 AM   I spent a total of  30 Minutes   in face to face in clinical consultation, greater than 50% of which was counseling/coordinating care for right lung mass bx

## 2015-08-24 NOTE — Sedation Documentation (Signed)
Patient is resting comfortably. 

## 2015-08-24 NOTE — Progress Notes (Signed)
Pt's VS remain stable. Sats 98% on R/A no SOB or increase WOB.  Pt report called to Dora on 6000. Pt transferred without incident.

## 2015-08-24 NOTE — Progress Notes (Signed)
Pt stood at side of bed to use urinal.  Pt C/O small amount of pain when he breaths in or out. VSS no SOB.  [place] notified. No new orders recieved

## 2015-08-24 NOTE — Progress Notes (Signed)
Patient stated that he does not have hypertension and that his pressures always run high during the first few takes. Passed on to night RN to recheck pressure.

## 2015-08-24 NOTE — Procedures (Signed)
Interventional Radiology Procedure Note  Procedure: CT guided biopsy of subsolid nodule of RLL. 4 x 01U core Complications: None Recommendations: - Bedrest until CXR cleared.  Minimize talking, coughing or otherwise straining.  - Follow up 1 hr CXR pending -NPO until CXR is reviewed   Signed,  Dulcy Fanny. Earleen Newport, DO

## 2015-08-25 ENCOUNTER — Observation Stay (HOSPITAL_COMMUNITY): Payer: Medicare Other

## 2015-08-25 ENCOUNTER — Inpatient Hospital Stay (HOSPITAL_COMMUNITY): Payer: Medicare Other

## 2015-08-25 DIAGNOSIS — I2510 Atherosclerotic heart disease of native coronary artery without angina pectoris: Secondary | ICD-10-CM | POA: Diagnosis present

## 2015-08-25 DIAGNOSIS — Z85118 Personal history of other malignant neoplasm of bronchus and lung: Secondary | ICD-10-CM | POA: Diagnosis not present

## 2015-08-25 DIAGNOSIS — Y813 Surgical instruments, materials and general- and plastic-surgery devices (including sutures) associated with adverse incidents: Secondary | ICD-10-CM | POA: Diagnosis present

## 2015-08-25 DIAGNOSIS — J939 Pneumothorax, unspecified: Secondary | ICD-10-CM | POA: Diagnosis not present

## 2015-08-25 DIAGNOSIS — Z7982 Long term (current) use of aspirin: Secondary | ICD-10-CM | POA: Diagnosis not present

## 2015-08-25 DIAGNOSIS — J449 Chronic obstructive pulmonary disease, unspecified: Secondary | ICD-10-CM | POA: Diagnosis present

## 2015-08-25 DIAGNOSIS — Z923 Personal history of irradiation: Secondary | ICD-10-CM | POA: Diagnosis not present

## 2015-08-25 DIAGNOSIS — Y848 Other medical procedures as the cause of abnormal reaction of the patient, or of later complication, without mention of misadventure at the time of the procedure: Secondary | ICD-10-CM | POA: Diagnosis present

## 2015-08-25 DIAGNOSIS — Z4682 Encounter for fitting and adjustment of non-vascular catheter: Secondary | ICD-10-CM | POA: Diagnosis not present

## 2015-08-25 DIAGNOSIS — R918 Other nonspecific abnormal finding of lung field: Secondary | ICD-10-CM | POA: Diagnosis present

## 2015-08-25 DIAGNOSIS — T797XXA Traumatic subcutaneous emphysema, initial encounter: Secondary | ICD-10-CM | POA: Diagnosis present

## 2015-08-25 DIAGNOSIS — I480 Paroxysmal atrial fibrillation: Secondary | ICD-10-CM | POA: Diagnosis present

## 2015-08-25 DIAGNOSIS — J95811 Postprocedural pneumothorax: Secondary | ICD-10-CM | POA: Diagnosis present

## 2015-08-25 DIAGNOSIS — Z87891 Personal history of nicotine dependence: Secondary | ICD-10-CM | POA: Diagnosis not present

## 2015-08-25 DIAGNOSIS — Z85820 Personal history of malignant melanoma of skin: Secondary | ICD-10-CM | POA: Diagnosis not present

## 2015-08-25 DIAGNOSIS — R911 Solitary pulmonary nodule: Secondary | ICD-10-CM | POA: Diagnosis not present

## 2015-08-25 MED ORDER — FENTANYL CITRATE (PF) 100 MCG/2ML IJ SOLN
INTRAMUSCULAR | Status: AC | PRN
  Administered 2015-08-25: 50 ug via INTRAVENOUS

## 2015-08-25 MED ORDER — FENTANYL CITRATE (PF) 100 MCG/2ML IJ SOLN
INTRAMUSCULAR | Status: AC
  Filled 2015-08-25: qty 4

## 2015-08-25 MED ORDER — MIDAZOLAM HCL 2 MG/2ML IJ SOLN
INTRAMUSCULAR | Status: AC
  Filled 2015-08-25: qty 4

## 2015-08-25 MED ORDER — LIDOCAINE HCL 1 % IJ SOLN
INTRAMUSCULAR | Status: AC
  Filled 2015-08-25: qty 20

## 2015-08-25 MED ORDER — MIDAZOLAM HCL 2 MG/2ML IJ SOLN
INTRAMUSCULAR | Status: AC | PRN
  Administered 2015-08-25: 1 mg via INTRAVENOUS

## 2015-08-25 MED ORDER — SODIUM CHLORIDE 0.9 % IV SOLN
INTRAVENOUS | Status: AC | PRN
  Administered 2015-08-25: 30 mL/h via INTRAVENOUS

## 2015-08-25 NOTE — Sedation Documentation (Signed)
MD at bedside. 

## 2015-08-25 NOTE — Progress Notes (Signed)
Patient ID: Benjamin [name], male   DOB: [birth year], 70 y.o.   MRN: [id number]    Referring Physician(s): [place] C  Supervising Physician: Daryll Brod  Patient Status: inpt  Chief Complaint: PTX after lung biopsy  Subjective: Doesn't feel well this morning.  Complains of some pain when he swallows. Feels more SOB today.  C/o swelling of his face, neck, and chest.  Allergies: Codeine  Medications: Prior to Admission medications   Medication Sig Start Date End Date Taking? Authorizing Provider  aspirin EC 81 MG EC tablet Take 1 tablet (81 mg total) by mouth daily. 10/18/12  Yes Rogelia Mire, NP  atorvastatin (LIPITOR) 20 MG tablet Take 20 mg by mouth daily at 6 PM.    Yes Historical Provider, MD  clonazePAM (KLONOPIN) 1 MG tablet Take 1 mg by mouth 3 (three) times daily.    Yes Historical Provider, MD  nitroGLYCERIN (NITROSTAT) 0.4 MG SL tablet Place 1 tablet (0.4 mg total) under the tongue every 5 (five) minutes x 3 doses as needed for chest pain. 10/18/12  Yes Rogelia Mire, NP    Vital Signs: BP 146/91 mmHg  Pulse 61  Temp(Src) 98.3 F (36.8 C) (Oral)  Resp 20  Ht '5\' 11"'$  (1.803 m)  Wt 212 lb (96.163 kg)  BMI 29.58 kg/m2  SpO2 98%  Physical Exam: Gen: NAD Heart: regular, brady Lungs/chest: Diminished BS at right apical aspect, but still some slight air movement noted.  The rest of his lung exam is CTA.  No tracheal deviation noted on exam. Edema and crepitus noted of his right face, neck, and chest down to his pectoralis.  Imaging: Nm Pet Image Initial (pi) Skull Base To Thigh  08/23/2015  CLINICAL DATA:  Subsequent treatment strategy for lung cancer. EXAM: NUCLEAR MEDICINE PET SKULL BASE TO THIGH TECHNIQUE: 10 point for mCi F-18 FDG was injected intravenously. Full-ring PET imaging was performed from the skull base to thigh after the radiotracer. CT data was obtained and used for attenuation correction and anatomic localization. FASTING BLOOD  GLUCOSE:  Value: 102 mg/dl COMPARISON:  CT chest 07/13/2015, of 07/15/2013 and 07/02/2012 and PET 06/28/2009. FINDINGS: NECK No hypermetabolic lymph nodes in the neck. CT images show no acute findings. CHEST Ground-glass nodule in the right lower lobe does show subtle FDG avidity, with an SUV max of 0.9. It is difficult to measure the lesion due to respiratory motion. Posttreatment consolidation in the upper left hemi thorax is mildly hypermetabolic, as expected. No hypermetabolic mediastinal, hilar or axillary lymph nodes. CT images show no pericardial or pleural effusion. Three-vessel coronary artery calcification. ABDOMEN/PELVIS No abnormal hypermetabolic activity within the liver, pancreas, adrenal glands, or spleen. No hypermetabolic lymph nodes in the abdomen or pelvis. CT images show a sub cm low-attenuation lesion in the dome of the liver, too small to characterize. Liver, gallbladder otherwise unremarkable. Right adrenalectomy. Left adrenal gland, and kidneys, spleen, pancreas, stomach and bowel are grossly unremarkable. Prostate is slightly enlarged. SKELETON No abnormal osseous hypermetabolism. Degenerative changes are seen in the right hip and spine. 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 IMPRESSION: 1. Ground-glass right lower lobe nodule shows subtle FDG avidity. 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 2. Postsurgical and post treatment changes in the left hemi thorax, stable. 3. Three-vessel coronary artery calcification. 4. Prostate is enlarged. Electronically Signed   By: Lorin Picket M.D.   On: 08/23/2015 13:00   Ct Biopsy  08/24/2015  INDICATION: 70 year old male with a history of right sub solid lung nodule with FDG activity EXAM: CT BIOPSY MEDICATIONS: None. ANESTHESIA/SEDATION: Moderate (conscious) sedation was employed during this procedure. A total of Versed 2.5 mg and Fentanyl 75 mcg was  administered intravenously. Moderate Sedation Time: 25 minutes. The patient's level of consciousness and vital signs were monitored continuously by radiology nursing throughout the procedure under my direct supervision. FLUOROSCOPY TIME:  CT COMPLICATIONS: SIR LEVEL B - Normal therapy, includes overnight admission for observation. PROCEDURE: The procedure, risks, benefits, and alternatives were explained to the patient and the patient's family. Specific risks that were addressed included bleeding, infection, pneumothorax, need for further procedure including chest tube placement, chance of delayed pneumothorax or hemorrhage, hemoptysis, nondiagnostic sample, cardiopulmonary collapse, death. Questions regarding the procedure were encouraged and answered. The patient understands and consents to the procedure. Patient was positioned in the supine position on the CT gantry table and a scout CT of the chest was performed for planning purposes. Once angle of approach was determined, the skin and subcutaneous tissues this scan was prepped and draped in the usual sterile fashion, and a sterile drape was applied covering the operative field. A sterile gown and sterile gloves were used for the procedure. Local anesthesia was provided with 1% Lidocaine. The skin and subcutaneous tissues were infiltrated 1% lidocaine for local anesthesia, and a small stab incision was made with an 11 blade scalpel. Using CT guidance, a 17 gauge trocar needle was advanced into the right lower lobetarget. After confirmation of the tip, separate 18 gauge core biopsies were performed. These were placed into solution for transportation to the lab. Exoseal device was deployed.  A final CT image was performed. Patient tolerated the procedure well and remained hemodynamically stable throughout. No complications were encountered and no significant blood loss was encounter. IMPRESSION: Status post right lower lobe nodule lung biopsy. Tissue specimen sent  to pathology for complete histopathologic analysis. Signed, Dulcy Fanny. Earleen Newport, DO [facility] Electronically Signed   By: Corrie Mckusick D.O.   On: 08/24/2015 18:20   Dg Chest Port 1 View  08/25/2015  CLINICAL DATA:  History of pneumothorax after lung biopsy, follow-up EXAM: PORTABLE CHEST 1 VIEW COMPARISON:  Chest x-ray of 08/24/2015 and CT chest of 07/13/2015 FINDINGS: There has been an increase in the right apical pneumothorax now approximately 10-15%. Right chest wall subcutaneous air has increased as well. The left apical opacity is unchanged. Mild cardiomegaly is stable. IMPRESSION: Increase in size of right apical pneumothorax now between 10 and 15% with increasing right chest wall subcutaneous emphysema. Electronically Signed   By: Ivar Drape M.D.   On: 08/25/2015 07:56   Dg Chest Port 1 View  08/24/2015  CLINICAL DATA:  Status post right lung biopsy EXAM: PORTABLE CHEST 1 VIEW COMPARISON:  Chest CT July 13, 2015 FINDINGS: There is a small right apical and apical lateral pneumothorax without tension component. There is consolidation with mass in the right base. There is also consolidation with mass in the left upper lobe. There is postoperative change on the left. Heart is mildly prominent but stable. No adenopathy evident. IMPRESSION: Small apical and apical lateral pneumothorax in the right without tension component. Areas of consolidation with mass right base medially and left upper lobe. There is cardiac prominence. Critical Value/emergent results were called by telephone at the time of interpretation on 08/24/2015 at 3:19 pm to Dr. Daiva Huge, who verbally acknowledged these results. Electronically Signed  By: Lowella Grip III M.D.   On: 08/24/2015 15:20    Labs:  CBC:  Recent Labs  08/24/15 1030  WBC 5.8  HGB 16.8  HCT 50.3  PLT 134*    COAGS:  Recent Labs  08/24/15 1030  INR 1.11  APTT 35    BMP:  Recent  Labs  09/14/14 1159  NA 139  K 4.1  CL 105  CO2 31  GLUCOSE 97  BUN 12  CALCIUM 9.0  CREATININE 0.98    LIVER FUNCTION TESTS:  Recent Labs  09/14/14 1159  BILITOT 0.8  AST 17  ALT 17  ALKPHOS 64  PROT 6.8  ALBUMIN 4.0    Assessment and Plan: 1. S/p right lung biopsy with post biopsy PTX -CXR this morning shows worsening PTX now 10-15% with significant subcutaneous air.  He is having more SOB today -he is NPO now, but did eat around 8 this morning. -will plan for chest tube placement today -Risks and Benefits discussed with the patient including bleeding, infection, damage to adjacent structures. All of the patient's questions were answered, patient is agreeable to proceed. Consent signed and in chart.    Electronically Signed: Henreitta Cea 08/25/2015, 9:50 AM   I spent a total of 25 Minutes at the the patient's bedside AND on the patient's hospital floor or unit, greater than 50% of which was counseling/coordinating care for PTX s/p lung biopsy, needs Chest tube

## 2015-08-25 NOTE — Sedation Documentation (Signed)
Awaiting Dr Annamaria Boots

## 2015-08-25 NOTE — Procedures (Signed)
Successful CT RT CHEST TUBE INSERTION NO COMP STABLE FULL REPORT IN PACS

## 2015-08-25 NOTE — Sedation Documentation (Signed)
Patient is resting comfortably. 

## 2015-08-25 NOTE — Sedation Documentation (Signed)
Moved to CT

## 2015-08-26 ENCOUNTER — Inpatient Hospital Stay (HOSPITAL_COMMUNITY): Payer: Medicare Other

## 2015-08-26 NOTE — Care Management Note (Signed)
Case Management Note  Patient Details  Name: WILIAM CAUTHORN MRN: [id number] Date of Birth: 1945-04-11  Subjective/Objective:                    Action/Plan:   Expected Discharge Date:                  Expected Discharge Plan:  Home/Self Care  In-House Referral:     Discharge planning Services     Post Acute Care Choice:    Choice offered to:     DME Arranged:    DME Agency:     HH Arranged:    [place] Agency:     Status of Service:  In process, will continue to follow  Medicare Important Message Given:    Date Medicare IM Given:    Medicare IM give by:    Date Additional Medicare IM Given:    Additional Medicare Important Message give by:     If discussed at [place] of Stay Meetings, dates discussed:    Additional Comments:  Marilu Favre, RN 08/26/2015, 2:37 PM

## 2015-08-26 NOTE — Progress Notes (Signed)
      [address]       [place] [address]             [phone]      Mr. Tamer had a needle biopsy and developed a small pneumothorax but had a great deal of subcutaneous emphysema.  A pigtail catheter was placed yesterday. \  He still has subq air but it is stable. No air leak at present  Biopsy showed adenocarcinoma in situ.  Will arrange an appointment with Dr. Tammi Klippel

## 2015-08-26 NOTE — Progress Notes (Signed)
Referring Physician(s): Cecilia C  Supervising Physician: Corrie Mckusick  Patient Status: In-pt  Chief Complaint:  Rt lung mass Bx 6/6---post bx ptx Chest tube placed 6/7 in IR  Subjective:  Feeling better today Breathing much easier Face swelling has reduced Still with subcu air noticeable along chest wall and clavicle area Denies pain or SOB  Allergies: Codeine  Medications: Prior to Admission medications   Medication Sig Start Date End Date Taking? Authorizing Provider  aspirin EC 81 MG EC tablet Take 1 tablet (81 mg total) by mouth daily. 10/18/12  Yes Rogelia Mire, NP  atorvastatin (LIPITOR) 20 MG tablet Take 20 mg by mouth daily at 6 PM.    Yes Historical Provider, MD  clonazePAM (KLONOPIN) 1 MG tablet Take 1 mg by mouth 3 (three) times daily.    Yes Historical Provider, MD  nitroGLYCERIN (NITROSTAT) 0.4 MG SL tablet Place 1 tablet (0.4 mg total) under the tongue every 5 (five) minutes x 3 doses as needed for chest pain. 10/18/12  Yes Rogelia Mire, NP     Vital Signs: BP 137/72 mmHg  Pulse 44  Temp(Src) 98.5 F (36.9 C) (Oral)  Resp 17  Ht '5\' 11"'$  (1.803 m)  Wt 212 lb (96.163 kg)  BMI 29.58 kg/m2  SpO2 96%  Physical Exam  Cardiovascular: Normal rate.   Pulmonary/Chest: Effort normal. He has no wheezes. He has no rales. He exhibits tenderness.  Sub cu air palpable Rt chest wall and Rt clavicular area  Musculoskeletal: Normal range of motion.  Neurological: He is alert.  Skin: Skin is warm and dry.  Site of Rt chest tube is clean and dry' NT No bleeding 10 cc serous fluid in Pleurvac No air leak    Psychiatric: He has a normal mood and affect. His behavior is normal. Judgment and thought content normal.  Nursing note and vitals reviewed.   Imaging: Dg Chest 1 View  08/25/2015  CLINICAL DATA:  Status post biopsy of a right pulmonary nodule 08/24/2015 with development of a right pneumothorax. EXAM: CHEST 1 VIEW COMPARISON:   Single-view of the chest 08/24/2015 and 08/25/2015. FINDINGS: Subcutaneous emphysema about the chest is increased since the study earlier today. Right apical pneumothorax is unchanged to slightly decreased. Left apical opacity is again seen. Surgical clips in the left suprahilar space are again identified. Heart size is normal. IMPRESSION: Unchanged to slightly decreased small right apical pneumothorax. Subcutaneous emphysema about the chest increased since the study earlier today. Electronically Signed   By: Inge Rise M.D.   On: 08/25/2015 14:03   Nm Pet Image Initial (pi) Skull Base To Thigh  08/23/2015  CLINICAL DATA:  Subsequent treatment strategy for lung cancer. EXAM: NUCLEAR MEDICINE PET SKULL BASE TO THIGH TECHNIQUE: 10 point for mCi F-18 FDG was injected intravenously. Full-ring PET imaging was performed from the skull base to thigh after the radiotracer. CT data was obtained and used for attenuation correction and anatomic localization. FASTING BLOOD GLUCOSE:  Value: 102 mg/dl COMPARISON:  CT chest 07/13/2015, of 07/15/2013 and 07/02/2012 and PET 06/28/2009. FINDINGS: NECK No hypermetabolic lymph nodes in the neck. CT images show no acute findings. CHEST Ground-glass nodule in the right lower lobe does show subtle FDG avidity, with an SUV max of 0.9. It is difficult to measure the lesion due to respiratory motion. Posttreatment consolidation in the upper left hemi thorax is mildly hypermetabolic, as expected. No hypermetabolic mediastinal, hilar or axillary lymph nodes. CT images show no pericardial or pleural  effusion. Three-vessel coronary artery calcification. ABDOMEN/PELVIS No abnormal hypermetabolic activity within the liver, pancreas, adrenal glands, or spleen. No hypermetabolic lymph nodes in the abdomen or pelvis. CT images show a sub cm low-attenuation lesion in the dome of the liver, too small to characterize. Liver, gallbladder otherwise unremarkable. Right adrenalectomy. Left adrenal  gland, and kidneys, spleen, pancreas, stomach and bowel are grossly unremarkable. Prostate is slightly enlarged. SKELETON No abnormal osseous hypermetabolism. Degenerative changes are seen in the right hip and spine. 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 IMPRESSION: 1. Ground-glass right lower lobe nodule shows subtle FDG avidity. 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 2. Postsurgical and post treatment changes in the left hemi thorax, stable. 3. Three-vessel coronary artery calcification. 4. Prostate is enlarged. Electronically Signed   By: Lorin Picket M.D.   On: 08/23/2015 13:00   Ct Biopsy  08/24/2015  INDICATION: 70 year old male with a history of right sub solid lung nodule with FDG activity EXAM: CT BIOPSY MEDICATIONS: None. ANESTHESIA/SEDATION: Moderate (conscious) sedation was employed during this procedure. A total of Versed 2.5 mg and Fentanyl 75 mcg was administered intravenously. Moderate Sedation Time: 25 minutes. The patient's level of consciousness and vital signs were monitored continuously by radiology nursing throughout the procedure under my direct supervision. FLUOROSCOPY TIME:  CT COMPLICATIONS: SIR LEVEL B - Normal therapy, includes overnight admission for observation. PROCEDURE: The procedure, risks, benefits, and alternatives were explained to the patient and the patient's family. Specific risks that were addressed included bleeding, infection, pneumothorax, need for further procedure including chest tube placement, chance of delayed pneumothorax or hemorrhage, hemoptysis, nondiagnostic sample, cardiopulmonary collapse, death. Questions regarding the procedure were encouraged and answered. The patient understands and consents to the procedure. Patient was positioned in the supine position on the CT gantry table and a scout CT of the chest was performed for planning purposes. Once angle  of approach was determined, the skin and subcutaneous tissues this scan was prepped and draped in the usual sterile fashion, and a sterile drape was applied covering the operative field. A sterile gown and sterile gloves were used for the procedure. Local anesthesia was provided with 1% Lidocaine. The skin and subcutaneous tissues were infiltrated 1% lidocaine for local anesthesia, and a small stab incision was made with an 11 blade scalpel. Using CT guidance, a 17 gauge trocar needle was advanced into the right lower lobetarget. After confirmation of the tip, separate 18 gauge core biopsies were performed. These were placed into solution for transportation to the lab. Exoseal device was deployed.  A final CT image was performed. Patient tolerated the procedure well and remained hemodynamically stable throughout. No complications were encountered and no significant blood loss was encounter. IMPRESSION: Status post right lower lobe nodule lung biopsy. Tissue specimen sent to pathology for complete histopathologic analysis. Signed, Dulcy Fanny. Earleen Newport, DO [facility] Electronically Signed   By: Corrie Mckusick D.O.   On: 08/24/2015 18:20   Dg Chest Port 1 View  08/25/2015  CLINICAL DATA:  History of pneumothorax after lung biopsy, follow-up EXAM: PORTABLE CHEST 1 VIEW COMPARISON:  Chest x-ray of 08/24/2015 and CT chest of 07/13/2015 FINDINGS: There has been an increase in the right apical pneumothorax now approximately 10-15%. Right chest wall subcutaneous air has increased as well. The left apical opacity is unchanged. Mild cardiomegaly is stable. IMPRESSION: Increase in size of right apical pneumothorax now between 10 and 15%  with increasing right chest wall subcutaneous emphysema. Electronically Signed   By: Ivar Drape M.D.   On: 08/25/2015 07:56   Dg Chest Port 1 View  08/24/2015  CLINICAL DATA:  Status post right lung biopsy EXAM: PORTABLE CHEST 1 VIEW  COMPARISON:  Chest CT July 13, 2015 FINDINGS: There is a small right apical and apical lateral pneumothorax without tension component. There is consolidation with mass in the right base. There is also consolidation with mass in the left upper lobe. There is postoperative change on the left. Heart is mildly prominent but stable. No adenopathy evident. IMPRESSION: Small apical and apical lateral pneumothorax in the right without tension component. Areas of consolidation with mass right base medially and left upper lobe. There is cardiac prominence. Critical Value/emergent results were called by telephone at the time of interpretation on 08/24/2015 at 3:19 pm to Dr. Daiva Huge, who verbally acknowledged these results. Electronically Signed   By: Lowella Grip III M.D.   On: 08/24/2015 15:20   Ct Image Guided Drainage By Percutaneous Catheter  08/25/2015  INDICATION: Enlarging right pneumothorax with right chest subcutaneous emphysema and pneumomediastinum. Recent right lung biopsy. EXAM: CT IMAGE GUIDED DRAINAGE BY PERCUTANEOUS CATHETER MEDICATIONS: The patient is currently admitted to the hospital and receiving intravenous antibiotics. The antibiotics were administered within an appropriate time frame prior to the initiation of the procedure. ANESTHESIA/SEDATION: Fentanyl 50 mcg IV; Versed 1.0 mg IV Moderate Sedation Time:  20 minutes The patient was continuously monitored during the procedure by the interventional radiology nurse under my direct supervision. COMPLICATIONS: None immediate. PROCEDURE: Informed written consent was obtained from the patient after a thorough discussion of the procedural risks, benefits and alternatives. All questions were addressed. Maximal Sterile Barrier Technique was utilized including caps, mask, sterile gowns, sterile gloves, sterile drape, hand hygiene and skin antiseptic. A timeout was performed prior to the initiation of the procedure. Previous imaging reviewed. Patient  positioned supine. Noncontrast localization CT performed. Right anterior chest marked at the second- third anterior interspace. Under CT guidance, an 18 gauge introducer needle was advanced from an anterior approach through the second third interspace into the pleural space. Needle position confirmed with CT. Guidewire inserted followed by dilatation to insert a 10 [place] drain. Drain catheter position confirmed with CT. Catheter connected to Pleur-Evac at low wall suction. Repeat imaging confirms improvement in the pneumothorax. Patient tolerated the procedure well.  No immediate complication. IMPRESSION: Successful CT-guided anterior right 10 Fr chest tube insertion for a biopsy related pneumothorax. Electronically Signed   By: Jerilynn Mages.  Shick M.D.   On: 08/25/2015 17:11    Labs:  CBC:  Recent Labs  08/24/15 1030  WBC 5.8  HGB 16.8  HCT 50.3  PLT 134*    COAGS:  Recent Labs  08/24/15 1030  INR 1.11  APTT 35    BMP:  Recent Labs  09/14/14 1159  NA 139  K 4.1  CL 105  CO2 31  GLUCOSE 97  BUN 12  CALCIUM 9.0  CREATININE 0.98    LIVER FUNCTION TESTS:  Recent Labs  09/14/14 1159  BILITOT 0.8  AST 17  ALT 17  ALKPHOS 64  PROT 6.8  ALBUMIN 4.0    Assessment and Plan:  Rt lung mass bx 6/6 Post bx PTX Admitted overnight- pt with CP and SOB 6/7 Chest tube required and placed 6/7 Better today CTube intact Will follow And discuss with Rad   Electronically Signed: Allysen Lazo A 08/26/2015, 11:33 AM   I  spent a total of 15 Minutes at the the patient's bedside AND on the patient's hospital floor or unit, greater than 50% of which was counseling/coordinating care for Rt chest tube placement/PTX

## 2015-08-27 ENCOUNTER — Inpatient Hospital Stay (HOSPITAL_COMMUNITY): Payer: Medicare Other

## 2015-08-27 ENCOUNTER — Other Ambulatory Visit: Payer: Self-pay | Admitting: *Deleted

## 2015-08-27 ENCOUNTER — Encounter: Payer: Self-pay | Admitting: *Deleted

## 2015-08-27 NOTE — Care Management Important Message (Signed)
Important Message  Patient Details  Name: Benjamin [name] MRN: [id number] Date of Birth: Sep 15, 1945   Medicare Important Message Given:  Yes    Czar Ysaguirre Abena 08/27/2015, 11:28 AM

## 2015-08-27 NOTE — Progress Notes (Signed)
      [address]       [place] [address]             [phone]      [name] feels better this afternoon  His subcutaneous emphysema is stable, there is no air leak  CXR shows no definite pneumothorax  My office is going to arrange an appointment with Dr. Tammi Klippel to discuss radiation therapy  Revonda Standard. Roxan Hockey, MD [facility] [phone]

## 2015-08-27 NOTE — Progress Notes (Signed)
Patient ID: Benjamin [name], male   DOB: [birth year], 70 y.o.   MRN: [id number]    Referring Physician(s): [place]  Supervising Physician: Daryll Brod  Patient Status: inpt  Chief Complaint: PTX after lung biopsy  Subjective: Patient feels ok today, but still complaining of a lot of air in his chest and neck/face area.  He denies SOB.  States he thinks he may have felt a little better when his CT was on suction, but wasn't sure if that was mental.  Allergies: Codeine  Medications: Prior to Admission medications   Medication Sig Start Date End Date Taking? Authorizing Provider  aspirin EC 81 MG EC tablet Take 1 tablet (81 mg total) by mouth daily. 10/18/12  Yes Rogelia Mire, NP  atorvastatin (LIPITOR) 20 MG tablet Take 20 mg by mouth daily at 6 PM.    Yes Historical Provider, MD  clonazePAM (KLONOPIN) 1 MG tablet Take 1 mg by mouth 3 (three) times daily.    Yes Historical Provider, MD  nitroGLYCERIN (NITROSTAT) 0.4 MG SL tablet Place 1 tablet (0.4 mg total) under the tongue every 5 (five) minutes x 3 doses as needed for chest pain. 10/18/12  Yes Rogelia Mire, NP    Vital Signs: BP 154/69 mmHg  Pulse 53  Temp(Src) 98.4 F (36.9 C) (Oral)  Resp 17  Ht '5\' 11"'$  (1.803 m)  Wt 212 lb (96.163 kg)  BMI 29.58 kg/m2  SpO2 98%  Physical Exam: Chest/lung: still with a lot of subq emphysema.  Lungs are CTAB.  Drain site is c/d/i.  Minimal output and no air leak noted  Imaging: Dg Chest 1 View  08/26/2015  CLINICAL DATA:  Right-sided pneumothorax with chest tube treatment EXAM: CHEST 1 VIEW COMPARISON:  Chest x-ray of August 25, 2015 FINDINGS: The previously demonstrated pleural line in the right pulmonary apex is not clearly evident today. A pigtail catheter has been placed with the distal aspect projecting over the lateral portion of the right fourth rib. There remains considerable subcutaneous emphysema in the right axillary region and at the bases of the neck  bilaterally. No pneumomediastinum is observed. The heart is top-normal in size but stable. There is a stable parenchymal masslike density in the left upper lobe. There surgical clips in the left suprahilar region. No pleural effusion is evident. IMPRESSION: The patient has undergone placement of a pigtail catheter for treatment of a right apical pneumothorax. No definite residual pneumothorax is observed on today's study but the images are limited due to overlying subcutaneous emphysema. There is underlying COPD. A masslike density peripherally in the left upper lobe is stable. Electronically Signed   By: David  Martinique M.D.   On: 08/26/2015 13:22   Dg Chest 1 View  08/25/2015  CLINICAL DATA:  Status post biopsy of a right pulmonary nodule 08/24/2015 with development of a right pneumothorax. EXAM: CHEST 1 VIEW COMPARISON:  Single-view of the chest 08/24/2015 and 08/25/2015. FINDINGS: Subcutaneous emphysema about the chest is increased since the study earlier today. Right apical pneumothorax is unchanged to slightly decreased. Left apical opacity is again seen. Surgical clips in the left suprahilar space are again identified. Heart size is normal. IMPRESSION: Unchanged to slightly decreased small right apical pneumothorax. Subcutaneous emphysema about the chest increased since the study earlier today. Electronically Signed   By: Inge Rise M.D.   On: 08/25/2015 14:03   Nm Pet Image Initial (pi) Skull Base To Thigh  08/23/2015  CLINICAL DATA:  Subsequent treatment strategy for  lung cancer. EXAM: NUCLEAR MEDICINE PET SKULL BASE TO THIGH TECHNIQUE: 10 point for mCi F-18 FDG was injected intravenously. Full-ring PET imaging was performed from the skull base to thigh after the radiotracer. CT data was obtained and used for attenuation correction and anatomic localization. FASTING BLOOD GLUCOSE:  Value: 102 mg/dl COMPARISON:  CT chest 07/13/2015, of 07/15/2013 and 07/02/2012 and PET 06/28/2009. FINDINGS: NECK No  hypermetabolic lymph nodes in the neck. CT images show no acute findings. CHEST Ground-glass nodule in the right lower lobe does show subtle FDG avidity, with an SUV max of 0.9. It is difficult to measure the lesion due to respiratory motion. Posttreatment consolidation in the upper left hemi thorax is mildly hypermetabolic, as expected. No hypermetabolic mediastinal, hilar or axillary lymph nodes. CT images show no pericardial or pleural effusion. Three-vessel coronary artery calcification. ABDOMEN/PELVIS No abnormal hypermetabolic activity within the liver, pancreas, adrenal glands, or spleen. No hypermetabolic lymph nodes in the abdomen or pelvis. CT images show a sub cm low-attenuation lesion in the dome of the liver, too small to characterize. Liver, gallbladder otherwise unremarkable. Right adrenalectomy. Left adrenal gland, and kidneys, spleen, pancreas, stomach and bowel are grossly unremarkable. Prostate is slightly enlarged. SKELETON No abnormal osseous hypermetabolism. Degenerative changes are seen in the right hip and spine. 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 IMPRESSION: 1. Ground-glass right lower lobe nodule shows subtle FDG avidity. 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 2. Postsurgical and post treatment changes in the left hemi thorax, stable. 3. Three-vessel coronary artery calcification. 4. Prostate is enlarged. Electronically Signed   By: Lorin Picket M.D.   On: 08/23/2015 13:00   Ct Biopsy  08/24/2015  INDICATION: 70 year old male with a history of right sub solid lung nodule with FDG activity EXAM: CT BIOPSY MEDICATIONS: None. ANESTHESIA/SEDATION: Moderate (conscious) sedation was employed during this procedure. A total of Versed 2.5 mg and Fentanyl 75 mcg was administered intravenously. Moderate Sedation Time: 25 minutes. The patient's level of consciousness and vital signs were  monitored continuously by radiology nursing throughout the procedure under my direct supervision. FLUOROSCOPY TIME:  CT COMPLICATIONS: SIR LEVEL B - Normal therapy, includes overnight admission for observation. PROCEDURE: The procedure, risks, benefits, and alternatives were explained to the patient and the patient's family. Specific risks that were addressed included bleeding, infection, pneumothorax, need for further procedure including chest tube placement, chance of delayed pneumothorax or hemorrhage, hemoptysis, nondiagnostic sample, cardiopulmonary collapse, death. Questions regarding the procedure were encouraged and answered. The patient understands and consents to the procedure. Patient was positioned in the supine position on the CT gantry table and a scout CT of the chest was performed for planning purposes. Once angle of approach was determined, the skin and subcutaneous tissues this scan was prepped and draped in the usual sterile fashion, and a sterile drape was applied covering the operative field. A sterile gown and sterile gloves were used for the procedure. Local anesthesia was provided with 1% Lidocaine. The skin and subcutaneous tissues were infiltrated 1% lidocaine for local anesthesia, and a small stab incision was made with an 11 blade scalpel. Using CT guidance, a 17 gauge trocar needle was advanced into the right lower lobetarget. After confirmation of the tip, separate 18 gauge core biopsies were performed. These were placed into solution for transportation to the lab. Exoseal device was deployed.  A final CT image was performed. Patient tolerated the procedure well and remained hemodynamically stable  throughout. No complications were encountered and no significant blood loss was encounter. IMPRESSION: Status post right lower lobe nodule lung biopsy. Tissue specimen sent to pathology for complete histopathologic analysis. Signed, Dulcy Fanny. Earleen Newport, DO [facility] Inter[facility] Electronically Signed   By: Corrie Mckusick D.O.   On: 08/24/2015 18:20   Dg Chest Port 1 View  08/27/2015  CLINICAL DATA:  Followup right pneumothorax with pigtail chest tube in place. EXAM: PORTABLE CHEST 1 VIEW COMPARISON:  08/26/2015 and earlier. FINDINGS: Pigtail drainage catheter in the right pleural space with no visible pneumothorax. Extensive subcutaneous emphysema throughout the chest wall and visualized lower neck, unchanged. Postsurgical changes with likely conglomerate scarring in the upper left lung. Postsurgical pleuroparenchymal scarring at the left base. Lungs otherwise clear. IMPRESSION: 1. Right pigtail pleural drainage catheter in place with no visible pneumothorax. 2. Extensive subcutaneous emphysema. 3. Likely postsurgical conglomerate scarring in the left upper lobe. Postsurgical pleuroparenchymal scarring at the left base. No acute cardiopulmonary disease. Electronically Signed   By: Evangeline Dakin M.D.   On: 08/27/2015 07:46   Dg Chest Port 1 View  08/25/2015  CLINICAL DATA:  History of pneumothorax after lung biopsy, follow-up EXAM: PORTABLE CHEST 1 VIEW COMPARISON:  Chest x-ray of 08/24/2015 and CT chest of 07/13/2015 FINDINGS: There has been an increase in the right apical pneumothorax now approximately 10-15%. Right chest wall subcutaneous air has increased as well. The left apical opacity is unchanged. Mild cardiomegaly is stable. IMPRESSION: Increase in size of right apical pneumothorax now between 10 and 15% with increasing right chest wall subcutaneous emphysema. Electronically Signed   By: Ivar Drape M.D.   On: 08/25/2015 07:56   Dg Chest Port 1 View  08/24/2015  CLINICAL DATA:  Status post right lung biopsy EXAM: PORTABLE CHEST 1 VIEW COMPARISON:  Chest CT July 13, 2015 FINDINGS: There is a small right apical and apical lateral pneumothorax without tension component. There is consolidation with mass in the right base. There is also  consolidation with mass in the left upper lobe. There is postoperative change on the left. Heart is mildly prominent but stable. No adenopathy evident. IMPRESSION: Small apical and apical lateral pneumothorax in the right without tension component. Areas of consolidation with mass right base medially and left upper lobe. There is cardiac prominence. Critical Value/emergent results were called by telephone at the time of interpretation on 08/24/2015 at 3:19 pm to Dr. Daiva Huge, who verbally acknowledged these results. Electronically Signed   By: Lowella Grip III M.D.   On: 08/24/2015 15:20   Ct Image Guided Drainage By Percutaneous Catheter  08/25/2015  INDICATION: Enlarging right pneumothorax with right chest subcutaneous emphysema and pneumomediastinum. Recent right lung biopsy. EXAM: CT IMAGE GUIDED DRAINAGE BY PERCUTANEOUS CATHETER MEDICATIONS: The patient is currently admitted to the hospital and receiving intravenous antibiotics. The antibiotics were administered within an appropriate time frame prior to the initiation of the procedure. ANESTHESIA/SEDATION: Fentanyl 50 mcg IV; Versed 1.0 mg IV Moderate Sedation Time:  20 minutes The patient was continuously monitored during the procedure by the interventional radiology nurse under my direct supervision. COMPLICATIONS: None immediate. PROCEDURE: Informed written consent was obtained from the patient after a thorough discussion of the procedural risks, benefits and alternatives. All questions were addressed. Maximal Sterile Barrier Technique was utilized including caps, mask, sterile gowns, sterile gloves, sterile drape, hand hygiene and skin antiseptic. A timeout was performed prior to the initiation of the procedure. Previous imaging reviewed. Patient positioned  supine. Noncontrast localization CT performed. Right anterior chest marked at the second- third anterior interspace. Under CT guidance, an 18 gauge introducer needle was advanced from an anterior  approach through the second third interspace into the pleural space. Needle position confirmed with CT. Guidewire inserted followed by dilatation to insert a 10 [place] drain. Drain catheter position confirmed with CT. Catheter connected to Pleur-Evac at low wall suction. Repeat imaging confirms improvement in the pneumothorax. Patient tolerated the procedure well.  No immediate complication. IMPRESSION: Successful CT-guided anterior right 10 Fr chest tube insertion for a biopsy related pneumothorax. Electronically Signed   By: Jerilynn Mages.  Shick M.D.   On: 08/25/2015 17:11    Labs:  CBC:  Recent Labs  08/24/15 1030  WBC 5.8  HGB 16.8  HCT 50.3  PLT 134*    COAGS:  Recent Labs  08/24/15 1030  INR 1.11  APTT 35    BMP:  Recent Labs  09/14/14 1159  NA 139  K 4.1  CL 105  CO2 31  GLUCOSE 97  BUN 12  CALCIUM 9.0  CREATININE 0.98    LIVER FUNCTION TESTS:  Recent Labs  09/14/14 1159  BILITOT 0.8  AST 17  ALT 17  ALKPHOS 64  PROT 6.8  ALBUMIN 4.0    Assessment and Plan: 1. PTX after lung biopsy on 6/6 by Earleen Newport CT insertion on 6/7 - Shick  -repeat film this morning shows no recurrence of PTX, but he has only been water sealed for 12 hours.   -after d/w Dr. Annamaria Boots and the patient, he would feel more comfortable to leave his tube on waterseal for today and plan for removal tomorrow. -mobilize some today  Electronically Signed: Makenah Karas E 08/27/2015, 8:40 AM   I spent a total of 15 Minutes at the the patient's bedside AND on the patient's hospital floor or unit, greater than 50% of which was counseling/coordinating care for ptx after lung biopsy

## 2015-08-27 NOTE — Progress Notes (Signed)
Oncology Nurse Navigator Documentation  Oncology Nurse Navigator Flowsheets 08/27/2015  Navigator Encounter Type Other  Treatment Phase Pre-Tx/Tx Discussion  Barriers/Navigation Needs Coordination of Care  Interventions Coordination of Care  Coordination of Care Appts  Acuity Level 1  Time Spent with Patient 15   Per Dr. Roxan Hockey, he would like patient to be set up with Dr. Tammi Klippel to discuss Rad Onc.  I notified Rad Onc scheduling, [place].

## 2015-08-28 ENCOUNTER — Inpatient Hospital Stay (HOSPITAL_COMMUNITY): Payer: Medicare Other

## 2015-08-28 NOTE — Discharge Summary (Signed)
Patient ID: Benjamin [name] MRN: [id number] DOB/AGE: 72-12-1945 72 y.o.  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  He now noted RLL mass x 1 yr- followed with Dr Roxan Hockey  CT 06/2015 revealed increase size  PET done 08/23/2015 showed interval slight increase in size of the ground-glass nodule in the right lower lobe since the examination 1 year ago. 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  He underwent a CT guided biopsy by Dr. Earleen Newport on 08/24/2015.  He developed a small apical pneumothorax and was admitted for observation overnight.  On 08/25/15, he was doing a little worse, c/o more shortness of breath and swelling of his face, neck and chest.  CXR done that morning revealed worsening PTX.   He underwent placement of a pigtail chest tube by Dr. Annamaria Boots on 08/25/2015 with resolution of PTX.  On 08/26/2015 he was doing much better and SOB had resolved.   He was put to water seal on 08/27/15.  CXR on 08/28/2015 showed no evidence of PTX after being on waterseal over night.  Chest tube removed without difficulty.   No PTX on CXR after tube removed  Patient doing well and wants to go home  Treatments:  INDICATION: 70 year old male with a history of right sub solid lung nodule with FDG activity  EXAM: CT BIOPSY  MEDICATIONS: None.  ANESTHESIA/SEDATION: Moderate (conscious) sedation was employed during this procedure. A total of Versed 2.5 mg and Fentanyl 75 mcg was administered intravenously.  Moderate Sedation Time: 25 minutes. The patient's level of consciousness and vital  signs were monitored continuously by radiology nursing throughout the procedure under my direct supervision.  FLUOROSCOPY TIME: CT  COMPLICATIONS: SIR LEVEL B - Normal therapy, includes overnight admission for observation.  PROCEDURE: The procedure, risks, benefits, and alternatives were explained to the patient and the patient's family. Specific risks that were addressed included bleeding, infection, pneumothorax, need for further procedure including chest tube placement, chance of delayed pneumothorax or hemorrhage, hemoptysis, nondiagnostic sample, cardiopulmonary collapse, death. Questions regarding the procedure were encouraged and answered. The patient understands and consents to the procedure.  Patient was positioned in the supine position on the CT gantry table and a scout CT of the chest was performed for planning purposes.  Once angle of approach was determined, the skin and subcutaneous tissues this scan was prepped and draped in the usual sterile fashion, and a sterile drape was applied covering the operative field. A sterile gown and sterile gloves were used for the procedure. Local anesthesia was provided with 1% Lidocaine.  The skin and subcutaneous tissues were infiltrated 1% lidocaine for local anesthesia, and a small stab incision was made with an 11 blade scalpel.  Using CT guidance, a 17 gauge trocar needle was advanced into the right lower lobetarget. After confirmation of the tip, separate 18 gauge core biopsies were performed. These were placed into solution for transportation to the lab.  Exoseal device was deployed. A final CT image was performed.  Patient tolerated the procedure well and remained hemodynamically stable throughout.  No complications were encountered and no significant blood loss was encounter.  IMPRESSION:  Status post right lower lobe nodule lung biopsy. Tissue specimen sent to pathology for complete  histopathologic analysis.  Signed,  Dulcy Fanny. Earleen Newport, DO  [facility]  [facility]   Electronically Signed  By: Corrie Mckusick D.O.  On: 08/24/2015 18:20   INDICATION: Enlarging right pneumothorax with right chest subcutaneous emphysema and pneumomediastinum. Recent right lung biopsy.  EXAM: CT IMAGE GUIDED DRAINAGE BY PERCUTANEOUS CATHETER  MEDICATIONS: The patient is currently admitted to the hospital and receiving intravenous antibiotics. The antibiotics were administered within an appropriate time frame prior to the initiation of the procedure.  ANESTHESIA/SEDATION: Fentanyl 50 mcg IV; Versed 1.0 mg IV  Moderate Sedation Time: 20 minutes  The patient was continuously monitored during the procedure by the interventional radiology nurse under my direct supervision.  COMPLICATIONS: None immediate.  PROCEDURE: Informed written consent was obtained from the patient after a thorough discussion of the procedural risks, benefits and alternatives. All questions were addressed. Maximal Sterile Barrier Technique was utilized including caps, mask, sterile gowns, sterile gloves, sterile drape, hand hygiene and skin antiseptic. A timeout was performed prior to the initiation of the procedure.  Previous imaging reviewed. Patient positioned supine. Noncontrast localization CT performed. Right anterior chest marked at the second- third anterior interspace.  Under CT guidance, an 18 gauge introducer needle was advanced from an anterior approach through the second third interspace into the pleural space. Needle position confirmed with CT. Guidewire inserted followed by dilatation to insert a 10 [place] drain. Drain catheter position confirmed with CT. Catheter connected to Pleur-Evac at low wall suction. Repeat imaging confirms improvement in the pneumothorax.  Patient tolerated the procedure well. No  immediate complication.  IMPRESSION: Successful CT-guided anterior right 10 Fr chest tube insertion for a biopsy related pneumothorax.   Electronically Signed  By: Jerilynn Mages. Shick M.D.  On: 08/25/2015 17:11  Discharge Exam: Blood pressure 151/87, pulse 42, temperature 98.1 F (36.7 C), temperature source Oral, resp. rate 17, height '5\' 11"'$  (1.803 m), weight 212 lb (96.163 kg), SpO2 97 %. Awake and Alert NAD Lungs clear Chest tube removed without difficulty  Disposition: 01-Home or Self Care F/U with Dr. Roxan Hockey as previously instructed.    Medication List    TAKE these medications        aspirin 81 MG EC tablet  Take 1 tablet (81 mg total) by mouth daily.     atorvastatin 20 MG tablet  Commonly known as:  LIPITOR  Take 20 mg by mouth daily at 6 PM.     clonazePAM 1 MG tablet  Commonly known as:  KLONOPIN  Take 1 mg by mouth 3 (three) times daily.     nitroGLYCERIN 0.4 MG SL tablet  Commonly known as:  NITROSTAT  Place 1 tablet (0.4 mg total) under the tongue every 5 (five) minutes x 3 doses as needed for chest pain.          Electronically Signed: Murrell Redden PA-C 08/28/2015, 12:37 PM   I have spent Less Than 30 Minutes discharging Benjamin [name].

## 2015-08-28 NOTE — Progress Notes (Signed)
  Mr Quesnell is s/p lung biopsy yesterday complicated by post op pneumothorax.  CT has been to water seal x 24 hours  CXR without pneumothorax  Pt doing well.  Chest tube removed without difficulty.  Will obtain chest Xray in 1 hour.  If ok, will D/C patient home.  Kendle Erker S Otis Portal PA-C 08/28/2015 11:22 AM

## 2015-08-30 ENCOUNTER — Telehealth: Payer: Self-pay | Admitting: Radiation Oncology

## 2015-08-30 NOTE — Telephone Encounter (Signed)
Left message for patient to return my call to set up consultation appointment with dr Tammi Klippel

## 2015-08-31 ENCOUNTER — Encounter: Payer: Self-pay | Admitting: Thoracic Surgery (Cardiothoracic Vascular Surgery)

## 2015-08-31 ENCOUNTER — Ambulatory Visit (INDEPENDENT_AMBULATORY_CARE_PROVIDER_SITE_OTHER): Payer: Medicare Other | Admitting: Thoracic Surgery (Cardiothoracic Vascular Surgery)

## 2015-08-31 VITALS — BP 153/85 | HR 52 | Resp 18 | Ht 71.0 in | Wt 210.0 lb

## 2015-08-31 DIAGNOSIS — I2510 Atherosclerotic heart disease of native coronary artery without angina pectoris: Secondary | ICD-10-CM

## 2015-08-31 DIAGNOSIS — C3431 Malignant neoplasm of lower lobe, right bronchus or lung: Secondary | ICD-10-CM | POA: Diagnosis not present

## 2015-08-31 DIAGNOSIS — Z85118 Personal history of other malignant neoplasm of bronchus and lung: Secondary | ICD-10-CM

## 2015-08-31 NOTE — Progress Notes (Signed)
[address]       ,[place] [address]             [phone]      HPI: Benjamin [name] returns for follow-up after his recent lung biopsy.  He is a 70 year old gentleman with a history of multiple lung cancers. 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 He had a right upper lobectomy in 2005 for a second non-small cell carcinoma. Both surgeries were done by Dr. Arlyce Dice. He then developed a third non-small cell cancer in the left lower lobe in 2009. That was treated with radiation. When Dr. Arlyce Dice retired I began following Benjamin [name].  A CT in 2014 showed chronic changes on the left from radiation and a groundglass opacity in the right lung which was unchanged. The groundglass opacity remained stable through April 2016. I saw him again in April 2017 and the groundglass opacity at increased in size. On PET CT there was minimal activity of 0.9, just barely above background.. There was no evidence of other disease.  A CT-guided needle biopsy was done on 08/24/2015. It showed adenocarcinoma in situ. He developed a pneumothorax after the biopsy. The pneumothorax never got very large, but he developed severe subcutaneous emphysema. Radiology placed a pigtail catheter. His air leak quickly resolved and the catheter was removed.  He is not having any problems with his breathing. He does still have some subcutaneous emphysema  in his right neck and shoulder. His voice has returned to normal.  Past Medical History  Diagnosis Date  . Adenopathy     RIGHT ADRENAL  . Anxiety   . Arthritis     KNEES/HANDS  . Hx of melanoma of skin   . PAF (paroxysmal atrial fibrillation) ([place])     a. not currently on anticoagulation (09/2012)  . Non-small cell lung cancer ([place])     a. s/p resection and XRT.  [name] CAD (coronary artery disease)     a. Prior known CTO of LCX with recanalizationby cath 2008;  b. 09/2012 NSTEMI/Cath/PCI: LM nl, LAD 84m D1 50p, LCX 100p (2.75x16  Promus Premier DES), [id number] 30, RCA 9093m.0x16 Promus Premier DES), EF 50-55%.      Current Outpatient Prescriptions  Medication Sig Dispense Refill  . aspirin EC 81 MG EC tablet Take 1 tablet (81 mg total) by mouth daily.    . aMarland Kitchenorvastatin (LIPITOR) 20 MG tablet Take 20 mg by mouth daily at 6 PM.     . clonazePAM (KLONOPIN) 1 MG tablet Take 1 mg by mouth 3 (three) times daily.     . nitroGLYCERIN (NITROSTAT) 0.4 MG SL tablet Place 1 tablet (0.4 mg total) under the tongue every 5 (five) minutes x 3 doses as needed for chest pain. 25 tablet 3   No current facility-administered medications for this visit.    Physical Exam BP 153/85 mmHg  Pulse 52  Resp 18  Ht '5\' 11"'$  (1.803 m)  Wt 210 lb (95.255 kg)  BMI 29.30 kg/m2  SpO2 975%602 24ar old man in no acute distress Alert and oriented 3 with no focal deficits Mild subcutaneous emphysema right shoulder and neck area Lungs diminished bilaterally but equal, no wheezing  Diagnostic Tests: Biopsy showed adenocarcinoma in situ  Impression: Benjamin [name] Seen a 70 31ar old man with a history of multiple previous lung cancers.. He's had a left upper lobectomy followed later by right upper lobectomy.. He had a third cancer that  was treated with  Radiation. 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 I don't think surgical resection as an option for him, but he may be a candidate for stereotactic radiation.  He has an appointment with Dr. Tammi Klippel next week to discuss possible stereotactic radiation.  He has an appointment with Dr. Benay Spice coming up in a couple of weeks.  Plan: Follow-up with Dr. Tammi Klippel and Dr. Marita Snellen plan to see him back in about 6 months with a chest x-ray.  Melrose Nakayama, MD [facility] [phone]

## 2015-09-02 ENCOUNTER — Encounter: Payer: Self-pay | Admitting: Radiation Oncology

## 2015-09-02 NOTE — Progress Notes (Addendum)
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 He had a right upper lobectomy in 2005 for a second non-small cell carcinoma. Both surgeries were done by Dr. Arlyce Dice. He then developed a third non-small cell cancer in the left lower lobe in 2009. That was treated with radiation. When Dr. Arlyce Dice retired Dr. Roxan Hockey began following Benjamin [name]. CT in 2014 showed chronic changes on the left from radiation and a groundglass opacity in the right lung which was unchanged. The groundglass opacity remained stable through April 2016. Dr. Roxan Hockey saw him again in April 2017 and the groundglass opacity at increased in size. On PET CT there was minimal activity of 0.9, just barely above background..     Tobacco/Marijuana/Snuff/ETOH use: former smoker  Past/Anticipated interventions by cardiothoracic surgery, if any: left upper lobectomy in 2003, right upper lobectomy in 2005.  Past/Anticipated interventions by medical oncology, if any: xrays and follow up  Signs/Symptoms  Weight changes, if any: no  Respiratory complaints, if any: stable exertional dyspnea, cough "related to sinus drainage," denies hemoptysis, denies difficulty swallowing  Hemoptysis, if any: no  Pain issues, if any:  Right hip discomfort and discomfort where chest tube was  SAFETY ISSUES:  Prior radiation? Yes; June 03, 2007- July 05, 2007 62.5 Gy in 25 fractions of 2.5 Gy to left lower lung mass  Pacemaker/ICD? NO pacemaker. Had heart attack 2014 two stents were placed.  Possible current pregnancy?no  Is the patient on methotrexate? no  Current Complaints / other details:  70 year old male.

## 2015-09-06 ENCOUNTER — Encounter: Payer: Self-pay | Admitting: Radiation Oncology

## 2015-09-06 ENCOUNTER — Ambulatory Visit
Admission: RE | Admit: 2015-09-06 | Discharge: 2015-09-06 | Disposition: A | Discharge status: Home / Self Care | Payer: Medicare Other | Source: Ambulatory Visit | Attending: Radiation Oncology | Admitting: Radiation Oncology

## 2015-09-06 VITALS — BP 155/68 | HR 42 | Temp 97.5°F | Resp 20 | Ht 71.0 in | Wt 211.5 lb

## 2015-09-06 DIAGNOSIS — M1990 Unspecified osteoarthritis, unspecified site: Secondary | ICD-10-CM | POA: Insufficient documentation

## 2015-09-06 DIAGNOSIS — Z902 Acquired absence of lung [part of]: Secondary | ICD-10-CM | POA: Insufficient documentation

## 2015-09-06 DIAGNOSIS — Z7982 Long term (current) use of aspirin: Secondary | ICD-10-CM | POA: Insufficient documentation

## 2015-09-06 DIAGNOSIS — I480 Paroxysmal atrial fibrillation: Secondary | ICD-10-CM | POA: Insufficient documentation

## 2015-09-06 DIAGNOSIS — Z85820 Personal history of malignant melanoma of skin: Secondary | ICD-10-CM | POA: Diagnosis not present

## 2015-09-06 DIAGNOSIS — R918 Other nonspecific abnormal finding of lung field: Secondary | ICD-10-CM | POA: Insufficient documentation

## 2015-09-06 DIAGNOSIS — Z885 Allergy status to narcotic agent status: Secondary | ICD-10-CM | POA: Insufficient documentation

## 2015-09-06 DIAGNOSIS — F419 Anxiety disorder, unspecified: Secondary | ICD-10-CM | POA: Diagnosis not present

## 2015-09-06 DIAGNOSIS — D381 Neoplasm of uncertain behavior of trachea, bronchus and lung: Secondary | ICD-10-CM

## 2015-09-06 DIAGNOSIS — I2510 Atherosclerotic heart disease of native coronary artery without angina pectoris: Secondary | ICD-10-CM | POA: Diagnosis not present

## 2015-09-06 DIAGNOSIS — Z87891 Personal history of nicotine dependence: Secondary | ICD-10-CM | POA: Diagnosis not present

## 2015-09-06 DIAGNOSIS — Z510 Encounter for antineoplastic radiation therapy: Secondary | ICD-10-CM | POA: Insufficient documentation

## 2015-09-06 DIAGNOSIS — C3431 Malignant neoplasm of lower lobe, right bronchus or lung: Secondary | ICD-10-CM | POA: Insufficient documentation

## 2015-09-06 NOTE — Progress Notes (Signed)
See progress note under physician encounter. 

## 2015-09-06 NOTE — Progress Notes (Signed)
Radiation Oncology         [phone] ________________________________  Initial outpatient Consultation  Name: RAMONDO DIETZE MRN: [id number]  Date: 09/06/2015  DOB: 03/25/1945  GD:[id number],DQQ Marcello Moores, MD  Melrose Nakayama, *   REFERRING PHYSICIAN: Melrose Nakayama, *  DIAGNOSIS: The primary encounter diagnosis was Malignant neoplasm of lower lobe of right lung ([place]). 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    ICD-9-CM ICD-10-CM   1. Malignant neoplasm of lower lobe of right lung (HCC) 162.5 C34.31   2. 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 male seen at the request of Dr. Roxan Hockey for a history of early lung cancer treated surgically and with radiotherapy with a new lesions in the right lung. He underwent left upper lobectomy for a stage IB non-small cell lung cancer in 2003. He underwent a right upper lobectomy in 2005 for a second non-small cell carcinoma.  He then developed a third non-small cell cancer in the left lower lobe in 2009 which was a another stage IB, T2 lesion and he completed conventional radiation to 62.5 Gy In 25 fractions with Dr. Tammi Klippel. When Dr. Arlyce Dice retired Dr. Roxan Hockey began following Mr. Julien Girt. He has been followed with serial CTs and a CT in 2014 showed chronic changes on the left from radiation and a groundglass opacity in the right lung which was unchanged. The groundglass opacity remained stable through April 2016. In April 2017 and the groundglass opacity at increased in size from 2.9 x2.6 cm to 3.2 x 3.1 cm.  PET CT on 07/23/15 showed minimal SUV of 0.9. 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 He comes today for further discussion of the options for radiotherapy to treat this lesion with possible SBRT.    PREVIOUS RADIATION THERAPY: Yes  June 03, 2007- July 05, 2007 62.5 Gy in 25 fractions of 2.5 Gy to left lower lung mass   PAST MEDICAL HISTORY:  Past Medical History  Diagnosis Date  . Adenopathy     RIGHT ADRENAL  . Anxiety   . Arthritis     KNEES/HANDS  . Hx of melanoma of skin   . PAF (paroxysmal atrial fibrillation) ([place])     a. not currently on anticoagulation (09/2012)  . CAD (coronary artery disease)     a. Prior known CTO of LCX with recanalizationby cath 2008;  b. 09/2012 NSTEMI/Cath/PCI: LM nl, LAD 39m D1 50p, LCX 100p (2.75x16 Promus Premier DES), 80-921mOM1 30, RCA 9029m.0x16 Promus Premier DES), EF 50-55%.  . Non[place] cell lung cancer [place]   a. s/p resection and XRT.      PAST SURGICAL HISTORY: Past Surgical History  Procedure Laterality Date  . Lobectomy left upper lobe  08/15/2001  . Adrenalextomy  02/2000  . Right vats, mini thoracotomy, wedge resection of right upper  01/18/2004  . Cardiac catheterization  2008    LAD 40, D1 OK, D2 50, CFX 90, RCA OK, EF 60%; Consider PCI PRN but the stenosis is quite complex and we would end up jailing several large posterolateral branches    . Left heart catheterization with coronary angiogram N/A 10/17/2012    Procedure: LEFT HEART CATHETERIZATION WITH CORONARY ANGIOGRAM;  Surgeon: MicSherren MochaD;  [facility];  Service: Cardiovascular;  Laterality: N/A;    FAMILY HISTORY:  Family History  Problem Relation Age of Onset  . Heart attack Father   . Cancer Mother     lung  . Cancer Cousin     breast    SOCIAL HISTORY:  Social History   Social History  . Marital Status: Divorced    Spouse Name: N/A  . Number of Children: N/A  . Years of Education: N/A   Occupational History  . 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 Smoking status: Former Smoker -- 1.00 packs/day for 40 years    Types: Cigarettes    Quit date: 08/15/2001  . Smokeless tobacco: Never Used  . Alcohol Use: No  . Drug Use: No  . Sexual Activity:  Not Currently   Other Topics Concern  . Not on file   Social History Narrative    ALLERGIES: Codeine  MEDICATIONS:  Current Outpatient Prescriptions  Medication Sig Dispense Refill  . aspirin EC 81 MG EC tablet Take 1 tablet (81 mg total) by mouth daily.    [name] atorvastatin (LIPITOR) 20 MG tablet Take 20 mg by mouth daily at 6 PM.     . clonazePAM (KLONOPIN) 1 MG tablet Take 1 mg by mouth 3 (three) times daily.     . nitroGLYCERIN (NITROSTAT) 0.4 MG SL tablet Place 1 tablet (0.4 mg total) under the tongue every 5 (five) minutes x 3 doses as needed for chest pain. (Patient not taking: Reported on 09/06/2015) 25 tablet 3   No current facility-administered medications for this encounter.    REVIEW OF SYSTEMS:  On review of systems, the patient reports that he is doing well overall. He denies any chest pain, cough, fevers, chills, night sweats, unintended weight changes.He states that he has experienced shortness of breath ongoing with exertion since his original surgery. He denies any significant change of this. He is not experiencing any hemoptysis. He denies any bowel or bladder disturbances, and denies abdominal pain, nausea or vomiting. He denies any new musculoskeletal or joint aches or pains. A complete review of systems is obtained and is otherwise negative.   PHYSICAL EXAM:  height is '5\' 11"'$  (1.803 m) and weight is 211 lb 8 oz (95.936 kg). His oral temperature is 97.5 F (36.4 C). His blood pressure is 155/68 and his pulse is 42. His respiration is 20 and oxygen saturation is 99%.   Pain Scale 0/10 In general this is a well appearing Caucasian male in no acute distress. He is alert and oriented x4 and appropriate throughout the examination. HEENT reveals that the patient is normocephalic, atraumatic. EOMs are intact. PERRLA. Skin is intact without any evidence of gross lesions. Cardiovascular exam reveals a regular rate and rhythm, no clicks rubs or murmurs are auscultated. Chest is  clear to auscultation bilaterally. Lymphatic assessment is performed and does not reveal any adenopathy in the cervical, supraclavicular, axillary, or inguinal chains. Abdomen has active bowel sounds in all quadrants and is intact. The abdomen is soft, non tender, non distended. Lower extremities are negative for pretibial pitting edema, deep calf tenderness, cyanosis or clubbing.   KPS = 90  100 - Normal; no complaints; no evidence of disease. 90   - Able to carry on normal activity; minor signs or symptoms of disease. 80   - Normal activity with effort; some signs or symptoms of disease. 24   - Cares for self; unable to carry on normal activity or to do active work.  60   - Requires occasional assistance, but is able to care for most of his personal needs. 50   - Requires considerable assistance and frequent medical care. 54   - Disabled; requires special care and assistance. 68   - Severely disabled; hospital admission is indicated although death not imminent. 14   - Very sick; hospital admission necessary; active supportive treatment necessary. 10   - Moribund; fatal processes progressing rapidly. 0     - Dead  Karnofsky DA, Abelmann [place], Craver LS and [place] JH [phone]) The use of the nitrogen mustards in the palliative treatment of carcinoma: with particular reference to bronchogenic carcinoma Cancer 1 634-56  LABORATORY DATA:  Lab Results  Component Value Date   WBC 5.8 08/24/2015   HGB 16.8 08/24/2015   HCT 50.3 08/24/2015   MCV 89.2 08/24/2015   PLT 134* 08/24/2015   Lab Results  Component Value Date   NA 139 09/14/2014   K 4.1 09/14/2014   CL 105 09/14/2014   CO2 31 09/14/2014   Lab Results  Component Value Date   ALT 17 09/14/2014   AST 17 09/14/2014   ALKPHOS 64 09/14/2014   BILITOT 0.8 09/14/2014     RADIOGRAPHY: Dg Chest 1 View  08/26/2015  CLINICAL DATA:  Right-sided pneumothorax with chest tube treatment EXAM: CHEST 1 VIEW COMPARISON:  Chest x-ray of August 25, 2015 FINDINGS: The previously demonstrated pleural line in the right pulmonary apex is not clearly evident today. A pigtail catheter has been placed with the distal aspect projecting over the lateral portion of the right fourth rib. There remains considerable subcutaneous emphysema in the right axillary region and at the bases of the neck bilaterally. No pneumomediastinum is observed. The heart is top-normal in size but stable. There is a stable parenchymal masslike density in the left upper lobe. There surgical clips in the left suprahilar region. No pleural effusion is evident. IMPRESSION: The patient has undergone placement of a pigtail catheter for treatment of a right apical pneumothorax. No definite residual pneumothorax is observed on today's study but the images are limited due to overlying subcutaneous emphysema. There is underlying COPD. A masslike density peripherally in the left upper lobe is stable. Electronically Signed   By: David  Martinique M.D.   On: 08/26/2015 13:22   Dg Chest 1 View  08/25/2015  CLINICAL DATA:  Status post biopsy of a right pulmonary nodule 08/24/2015 with development of a right pneumothorax. EXAM: CHEST 1 VIEW COMPARISON:  Single-view of the chest 08/24/2015 and 08/25/2015. FINDINGS: Subcutaneous emphysema about the chest is increased since the study earlier today. Right apical pneumothorax is unchanged to slightly decreased. Left apical opacity is again seen. Surgical clips in the left suprahilar space are again identified. Heart size is normal. IMPRESSION: Unchanged to slightly decreased small right apical pneumothorax. Subcutaneous emphysema about the chest increased since the study earlier today. Electronically Signed   By: Inge Rise M.D.   On: 08/25/2015 14:03   Nm Pet Image Initial (pi) Skull Base To Thigh  08/23/2015  CLINICAL DATA:  Subsequent treatment strategy for lung cancer. EXAM: NUCLEAR MEDICINE PET SKULL BASE TO THIGH TECHNIQUE: 10 point for mCi F-18 FDG  was injected intravenously. Full-ring PET imaging was performed from the skull base to thigh after the radiotracer. CT data was obtained and used for attenuation correction and anatomic localization. FASTING BLOOD GLUCOSE:  Value: 102 mg/dl COMPARISON:  CT chest 07/13/2015, of 07/15/2013 and 07/02/2012 and PET 06/28/2009. FINDINGS: NECK No  hypermetabolic lymph nodes in the neck. CT images show no acute findings. CHEST Ground-glass nodule in the right lower lobe does show subtle FDG avidity, with an SUV max of 0.9. It is difficult to measure the lesion due to respiratory motion. Posttreatment consolidation in the upper left hemi thorax is mildly hypermetabolic, as expected. No hypermetabolic mediastinal, hilar or axillary lymph nodes. CT images show no pericardial or pleural effusion. Three-vessel coronary artery calcification. ABDOMEN/PELVIS No abnormal hypermetabolic activity within the liver, pancreas, adrenal glands, or spleen. No hypermetabolic lymph nodes in the abdomen or pelvis. CT images show a sub cm low-attenuation lesion in the dome of the liver, too small to characterize. Liver, gallbladder otherwise unremarkable. Right adrenalectomy. Left adrenal gland, and kidneys, spleen, pancreas, stomach and bowel are grossly unremarkable. Prostate is slightly enlarged. SKELETON No abnormal osseous hypermetabolism. Degenerative changes are seen in the right hip and spine. 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 IMPRESSION: 1. Ground-glass right lower lobe nodule shows subtle FDG avidity. 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 2. Postsurgical and post treatment changes in the left hemi thorax, stable. 3. Three-vessel coronary artery calcification. 4. Prostate is enlarged. Electronically Signed   By: Lorin Picket M.D.   On: 08/23/2015 13:00   Ct Biopsy  08/24/2015  INDICATION: 70 year old male with a history of  right sub solid lung nodule with FDG activity EXAM: CT BIOPSY MEDICATIONS: None. ANESTHESIA/SEDATION: Moderate (conscious) sedation was employed during this procedure. A total of Versed 2.5 mg and Fentanyl 75 mcg was administered intravenously. Moderate Sedation Time: 25 minutes. The patient's level of consciousness and vital signs were monitored continuously by radiology nursing throughout the procedure under my direct supervision. FLUOROSCOPY TIME:  CT COMPLICATIONS: SIR LEVEL B - Normal therapy, includes overnight admission for observation. PROCEDURE: The procedure, risks, benefits, and alternatives were explained to the patient and the patient's family. Specific risks that were addressed included bleeding, infection, pneumothorax, need for further procedure including chest tube placement, chance of delayed pneumothorax or hemorrhage, hemoptysis, nondiagnostic sample, cardiopulmonary collapse, death. Questions regarding the procedure were encouraged and answered. The patient understands and consents to the procedure. Patient was positioned in the supine position on the CT gantry table and a scout CT of the chest was performed for planning purposes. Once angle of approach was determined, the skin and subcutaneous tissues this scan was prepped and draped in the usual sterile fashion, and a sterile drape was applied covering the operative field. A sterile gown and sterile gloves were used for the procedure. Local anesthesia was provided with 1% Lidocaine. The skin and subcutaneous tissues were infiltrated 1% lidocaine for local anesthesia, and a small stab incision was made with an 11 blade scalpel. Using CT guidance, a 17 gauge trocar needle was advanced into the right lower lobetarget. After confirmation of the tip, separate 18 gauge core biopsies were performed. These were placed into solution for transportation to the lab. Exoseal device was deployed.  A final CT image was performed. Patient tolerated the  procedure well and remained hemodynamically stable throughout. No complications were encountered and no significant blood loss was encounter. IMPRESSION: Status post right lower lobe nodule lung biopsy. Tissue specimen sent to pathology for complete histopathologic analysis. Signed, Dulcy Fanny. Earleen Newport, DO [facility] Electronically Signed   By: Corrie Mckusick D.O.   On: 08/24/2015 18:20   Dg Chest Port 1 View  08/28/2015  CLINICAL DATA:  Post right chest tube removal. EXAM: PORTABLE CHEST 1 VIEW COMPARISON:  August 28, 2015 FINDINGS: Significant air is seen in the subcutaneous tissues of the base of the neck bilaterally and over the right chest wall. This limits evaluation. The right pigtail catheter has been removed. There is no convincing evidence of a pneumothorax. Recommend attention on follow-up. Postsurgical changes are seen on the left. No other interval changes. IMPRESSION: The study is limited due to extensive subcutaneous soft tissue gas. No convincing evidence of pneumothorax after chest tube removal. Electronically Signed   By: Dorise Bullion III M.D   On: 08/28/2015 12:20   Dg Chest Port 1 View  08/28/2015  CLINICAL DATA:  70 year old male with a history of pneumothorax status post lung biopsy. EXAM: PORTABLE CHEST 1 VIEW COMPARISON:  08/27/2015, 08/26/2015, 08/25/2015, FINDINGS: Cardiomediastinal silhouette unchanged. Pigtail thoracostomy tube in the right chest is unchanged. No visualized pneumothorax. Extensive bilateral chest wall and supraclavicular myofacial/subcutaneous gas. No confluent airspace disease. Left-sided pleural based tissue is better characterized on recent PET-CT. No evidence of persisting hemorrhage at the site of recent biopsy on the right. IMPRESSION: Unchanged right pigtail thoracostomy, with no persisting pneumothorax. Extensive chest wall myofacial and subcutaneous gas. Signed, Dulcy Fanny. Earleen Newport, DO [facility] Electronically Signed   By: Corrie Mckusick D.O.   On: 08/28/2015 09:43   Dg Chest Port 1 View  08/27/2015  CLINICAL DATA:  Followup right pneumothorax with pigtail chest tube in place. EXAM: PORTABLE CHEST 1 VIEW COMPARISON:  08/26/2015 and earlier. FINDINGS: Pigtail drainage catheter in the right pleural space with no visible pneumothorax. Extensive subcutaneous emphysema throughout the chest wall and visualized lower neck, unchanged. Postsurgical changes with likely conglomerate scarring in the upper left lung. Postsurgical pleuroparenchymal scarring at the left base. Lungs otherwise clear. IMPRESSION: 1. Right pigtail pleural drainage catheter in place with no visible pneumothorax. 2. Extensive subcutaneous emphysema. 3. Likely postsurgical conglomerate scarring in the left upper lobe. Postsurgical pleuroparenchymal scarring at the left base. No acute cardiopulmonary disease. Electronically Signed   By: Evangeline Dakin M.D.   On: 08/27/2015 07:46   Dg Chest Port 1 View  08/25/2015  CLINICAL DATA:  History of pneumothorax after lung biopsy, follow-up EXAM: PORTABLE CHEST 1 VIEW COMPARISON:  Chest x-ray of 08/24/2015 and CT chest of 07/13/2015 FINDINGS: There has been an increase in the right apical pneumothorax now approximately 10-15%. Right chest wall subcutaneous air has increased as well. The left apical opacity is unchanged. Mild cardiomegaly is stable. IMPRESSION: Increase in size of right apical pneumothorax now between 10 and 15% with increasing right chest wall subcutaneous emphysema. Electronically Signed   By: Ivar Drape M.D.   On: 08/25/2015 07:56   Dg Chest Port 1 View  08/24/2015  CLINICAL DATA:  Status post right lung biopsy EXAM: PORTABLE CHEST 1 VIEW COMPARISON:  Chest CT July 13, 2015 FINDINGS: There is a small right apical and apical lateral pneumothorax without tension component. There is consolidation with mass in the right  base. There is also consolidation with mass in the left upper lobe. There is postoperative change on the left. Heart is mildly prominent but stable. No adenopathy evident. IMPRESSION: Small apical and apical lateral pneumothorax in the right without tension component. Areas of consolidation with mass right base medially and left upper lobe. There is cardiac prominence. Critical Value/emergent results were called by telephone at the time of interpretation on 08/24/2015 at 3:19 pm to Dr. Daiva Huge, who  verbally acknowledged these results. Electronically Signed   By: Lowella Grip III M.D.   On: 08/24/2015 15:20   Ct Image Guided Drainage By Percutaneous Catheter  08/25/2015  INDICATION: Enlarging right pneumothorax with right chest subcutaneous emphysema and pneumomediastinum. Recent right lung biopsy. EXAM: CT IMAGE GUIDED DRAINAGE BY PERCUTANEOUS CATHETER MEDICATIONS: The patient is currently admitted to the hospital and receiving intravenous antibiotics. The antibiotics were administered within an appropriate time frame prior to the initiation of the procedure. ANESTHESIA/SEDATION: Fentanyl 50 mcg IV; Versed 1.0 mg IV Moderate Sedation Time:  20 minutes The patient was continuously monitored during the procedure by the interventional radiology nurse under my direct supervision. COMPLICATIONS: None immediate. PROCEDURE: Informed written consent was obtained from the patient after a thorough discussion of the procedural risks, benefits and alternatives. All questions were addressed. Maximal Sterile Barrier Technique was utilized including caps, mask, sterile gowns, sterile gloves, sterile drape, hand hygiene and skin antiseptic. A timeout was performed prior to the initiation of the procedure. Previous imaging reviewed. Patient positioned supine. Noncontrast localization CT performed. Right anterior chest marked at the second- third anterior interspace. Under CT guidance, an 18 gauge introducer needle was  advanced from an anterior approach through the second third interspace into the pleural space. Needle position confirmed with CT. Guidewire inserted followed by dilatation to insert a 10 [place] drain. Drain catheter position confirmed with CT. Catheter connected to Pleur-Evac at low wall suction. Repeat imaging confirms improvement in the pneumothorax. Patient tolerated the procedure well.  No immediate complication. IMPRESSION: Successful CT-guided anterior right 10 Fr chest tube insertion for a biopsy related pneumothorax. Electronically Signed   By: Jerilynn Mages.  Shick M.D.   On: 08/25/2015 17:11      IMPRESSION: 70 y.o. 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 He is not an ideal surgical candidate. He is a good candidate for stereotatic body radiation therapy. The patient will proceed with SBRT.   PLAN:  Today, I talked to the patient and family about the findings and work-up thus far.  We discussed the natural history of early stage bronchoalveolar carcinoma and general treatment, highlighting the role of radiotherapy in the management.  We discussed the available radiation techniques, and focused on the details of logistics and delivery.  We reviewed the anticipated acute and late sequelae associated with radiation in this setting.  The patient was encouraged to ask questions that I answered to the best of my ability.  I filled out a patient counseling form during our discussion including treatment diagrams.  We retained a copy for our records.  The patient would like to proceed with SBRT and has been scheduled for CT simulation, 6/22 at 1:00 p.m.   I spent 60 minutes minutes face to face with the patient and more than 50% of that time was spent in counseling and/or coordination of care. Work toward setting up a planning appointment on Friday or Thursday of this week.     The above documentation reflects my direct findings during this shared patient visit. Please see the separate note by Dr. Tammi Klippel on this date for the remainder of the patient's plan of care.   Carola Rhine, PAC  This document serves as a record of services personally performed by Tyler Pita, MD. It was created on his behalf by Truddie Hidden, a trained medical scribe. The creation  of this record is based on the scribe's personal observations and the provider's statements to them. This document has been checked and approved by the attending provider.

## 2015-09-09 ENCOUNTER — Ambulatory Visit
Admission: RE | Admit: 2015-09-09 | Discharge: 2015-09-09 | Disposition: A | Discharge status: Home / Self Care | Payer: Medicare Other | Source: Ambulatory Visit | Attending: Radiation Oncology | Admitting: Radiation Oncology

## 2015-09-09 DIAGNOSIS — R918 Other nonspecific abnormal finding of lung field: Secondary | ICD-10-CM | POA: Diagnosis not present

## 2015-09-09 DIAGNOSIS — Z510 Encounter for antineoplastic radiation therapy: Secondary | ICD-10-CM | POA: Diagnosis not present

## 2015-09-09 DIAGNOSIS — C3431 Malignant neoplasm of lower lobe, right bronchus or lung: Secondary | ICD-10-CM | POA: Diagnosis not present

## 2015-09-09 DIAGNOSIS — F419 Anxiety disorder, unspecified: Secondary | ICD-10-CM | POA: Diagnosis not present

## 2015-09-09 DIAGNOSIS — M1990 Unspecified osteoarthritis, unspecified site: Secondary | ICD-10-CM | POA: Diagnosis not present

## 2015-09-09 DIAGNOSIS — Z902 Acquired absence of lung [part of]: Secondary | ICD-10-CM | POA: Diagnosis not present

## 2015-09-09 NOTE — Addendum Note (Signed)
Encounter addended by: Heywood Footman, RN on: 09/09/2015  8:59 AM<BR>     Documentation filed: Charges VN

## 2015-09-09 NOTE — Progress Notes (Signed)
Radiation Oncology         (336) [phone] ________________________________  Name: Benjamin [name] MRN: [id number]  Date: 09/09/2015  DOB: 10-20-1945  STEREOTACTIC BODY RADIOTHERAPY SIMULATION AND TREATMENT PLANNING NOTE    ICD-9-CM ICD-10-CM   1. 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  NARRATIVE:  The patient was brought to the [facility].  Identity was confirmed.  All relevant records and images related to the planned course of therapy were reviewed.  The patient freely provided informed written consent to proceed with treatment after reviewing the details related to the planned course of therapy. The consent form was witnessed and verified by the simulation staff.  Then, the patient was set-up in a stable reproducible  supine position for radiation therapy.  A BodyFix immobilization pillow was fabricated for reproducible positioning.  Then I personally applied the abdominal compression paddle to limit respiratory excursion.  4D respiratoy motion management CT images were obtained.  Surface markings were placed.  The CT images were loaded into the planning software.  Then, using Cine, MIP, and standard views, the internal target volume (ITV) and planning target volumes (PTV) were delinieated, and avoidance structures were contoured.  Treatment planning then occurred.  The radiation prescription was entered and confirmed.  A total of two complex treatment devices were fabricated in the form of the BodyFix immobilization pillow and a neck accuform cushion.  I have requested : 3D Simulation  I have requested a DVH of the following structures: Heart, Lungs, Esophagus, Chest Wall, Brachial Plexus, Major Blood Vessels, and targets.  SPECIAL  TREATMENT PROCEDURE:  The planned course of therapy using radiation constitutes a special treatment procedure. Special care is required in the management of this patient for the following reasons. This treatment constitutes a Special Treatment Procedure for the following reason: [ High dose per fraction requiring special monitoring for increased toxicities of treatment including daily imaging..  The special nature of the planned course of radiotherapy will require increased physician supervision and oversight to ensure patient's safety with optimal treatment outcomes.  RESPIRATORY MOTION MANAGEMENT SIMULATION:  In order to account for effect of respiratory motion on target structures and other organs in the planning and delivery of radiotherapy, this patient underwent respiratory motion management simulation.  To accomplish this, when the patient was brought to the CT simulation planning suite, 4D respiratoy motion management CT images were obtained.  The CT images were loaded into the planning software.  Then, using a variety of tools including Cine, MIP, and standard views, the target volume and planning target volumes (PTV) were delineated.  Avoidance structures were contoured.  Treatment planning then occurred.  Dose volume histograms were generated and reviewed for each of the requested structure.  The resulting plan was carefully reviewed and approved today.  PLAN:  The patient will receive 54 Gy in 3 fractions.  ________________________________  Sheral Apley Tammi Klippel, M.D.  This document serves as a record of services personally performed by Tyler Pita, MD. It was created on his behalf by Truddie Hidden, a trained medical scribe. The creation of this record is based on the scribe's personal observations and the provider's statements to them. This document has been checked and approved by the attending provider.

## 2015-09-17 DIAGNOSIS — M1990 Unspecified osteoarthritis, unspecified site: Secondary | ICD-10-CM | POA: Diagnosis not present

## 2015-09-17 DIAGNOSIS — Z902 Acquired absence of lung [part of]: Secondary | ICD-10-CM | POA: Diagnosis not present

## 2015-09-17 DIAGNOSIS — C3431 Malignant neoplasm of lower lobe, right bronchus or lung: Secondary | ICD-10-CM | POA: Diagnosis not present

## 2015-09-17 DIAGNOSIS — F419 Anxiety disorder, unspecified: Secondary | ICD-10-CM | POA: Diagnosis not present

## 2015-09-17 DIAGNOSIS — R918 Other nonspecific abnormal finding of lung field: Secondary | ICD-10-CM | POA: Diagnosis not present

## 2015-09-17 DIAGNOSIS — Z510 Encounter for antineoplastic radiation therapy: Secondary | ICD-10-CM | POA: Diagnosis not present

## 2015-09-20 ENCOUNTER — Telehealth: Payer: Self-pay | Admitting: Radiation Oncology

## 2015-09-20 ENCOUNTER — Ambulatory Visit
Admission: RE | Admit: 2015-09-20 | Discharge: 2015-09-20 | Disposition: A | Discharge status: Home / Self Care | Payer: Medicare Other | Source: Ambulatory Visit | Attending: Radiation Oncology | Admitting: Radiation Oncology

## 2015-09-20 DIAGNOSIS — Z510 Encounter for antineoplastic radiation therapy: Secondary | ICD-10-CM | POA: Diagnosis not present

## 2015-09-20 DIAGNOSIS — R918 Other nonspecific abnormal finding of lung field: Secondary | ICD-10-CM | POA: Diagnosis not present

## 2015-09-20 DIAGNOSIS — M1990 Unspecified osteoarthritis, unspecified site: Secondary | ICD-10-CM | POA: Diagnosis not present

## 2015-09-20 DIAGNOSIS — F419 Anxiety disorder, unspecified: Secondary | ICD-10-CM | POA: Diagnosis not present

## 2015-09-20 DIAGNOSIS — Z902 Acquired absence of lung [part of]: Secondary | ICD-10-CM | POA: Diagnosis not present

## 2015-09-20 DIAGNOSIS — C3431 Malignant neoplasm of lower lobe, right bronchus or lung: Secondary | ICD-10-CM | POA: Diagnosis not present

## 2015-09-20 NOTE — Telephone Encounter (Signed)
Returned patient's call. Left message explaining he is safe to wear deodorant to his treatment appointments since his lower lobe lung is being treated and not his axillary nodes. No answer. Left detailed message and encouraged patient to call back with future needs.

## 2015-09-22 ENCOUNTER — Ambulatory Visit: Payer: Medicare Other | Admitting: Radiation Oncology

## 2015-09-23 ENCOUNTER — Ambulatory Visit
Admission: RE | Admit: 2015-09-23 | Discharge: 2015-09-23 | Disposition: A | Discharge status: Home / Self Care | Payer: Medicare Other | Source: Ambulatory Visit | Attending: Radiation Oncology | Admitting: Radiation Oncology

## 2015-09-23 DIAGNOSIS — C3431 Malignant neoplasm of lower lobe, right bronchus or lung: Secondary | ICD-10-CM | POA: Diagnosis not present

## 2015-09-23 DIAGNOSIS — Z510 Encounter for antineoplastic radiation therapy: Secondary | ICD-10-CM | POA: Diagnosis not present

## 2015-09-23 DIAGNOSIS — F419 Anxiety disorder, unspecified: Secondary | ICD-10-CM | POA: Diagnosis not present

## 2015-09-23 DIAGNOSIS — M1990 Unspecified osteoarthritis, unspecified site: Secondary | ICD-10-CM | POA: Diagnosis not present

## 2015-09-23 DIAGNOSIS — Z902 Acquired absence of lung [part of]: Secondary | ICD-10-CM | POA: Diagnosis not present

## 2015-09-23 DIAGNOSIS — R918 Other nonspecific abnormal finding of lung field: Secondary | ICD-10-CM | POA: Diagnosis not present

## 2015-09-24 ENCOUNTER — Ambulatory Visit: Payer: Medicare Other | Admitting: Radiation Oncology

## 2015-09-27 ENCOUNTER — Encounter: Payer: Self-pay | Admitting: Radiation Oncology

## 2015-09-27 ENCOUNTER — Ambulatory Visit
Admission: RE | Admit: 2015-09-27 | Discharge: 2015-09-27 | Disposition: A | Discharge status: Home / Self Care | Payer: Medicare Other | Source: Ambulatory Visit | Attending: Radiation Oncology | Admitting: Radiation Oncology

## 2015-09-27 VITALS — BP 170/72 | HR 52 | Temp 98.0°F | Resp 18 | Wt 211.4 lb

## 2015-09-27 DIAGNOSIS — M1990 Unspecified osteoarthritis, unspecified site: Secondary | ICD-10-CM | POA: Diagnosis not present

## 2015-09-27 DIAGNOSIS — R918 Other nonspecific abnormal finding of lung field: Secondary | ICD-10-CM | POA: Diagnosis not present

## 2015-09-27 DIAGNOSIS — Z902 Acquired absence of lung [part of]: Secondary | ICD-10-CM | POA: Diagnosis not present

## 2015-09-27 DIAGNOSIS — C3431 Malignant neoplasm of lower lobe, right bronchus or lung: Secondary | ICD-10-CM | POA: Diagnosis not present

## 2015-09-27 DIAGNOSIS — Z510 Encounter for antineoplastic radiation therapy: Secondary | ICD-10-CM | POA: Diagnosis not present

## 2015-09-27 DIAGNOSIS — F419 Anxiety disorder, unspecified: Secondary | ICD-10-CM | POA: Diagnosis not present

## 2015-09-27 NOTE — Progress Notes (Signed)
  Radiation Oncology         [phone]   Name: Benjamin [name] MRN: [id number]   Date: 09/27/2015  DOB: [birth year]     Weekly Radiation Therapy Management    ICD-9-CM ICD-10-CM   1. 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  Weight and vitals stable. Denies pain. Reports discomfort in right hip and old chest tube site continues. Reports exertional dyspnea is unchanged. Reports an occasional cough with clear sputum. Denies hemoptysis. Denies difficulty swallowing. Denies skin changes within treatment. One month follow up appointment card given. Patient   The patient is without complaint. Set-up films were reviewed. The chart was checked.  Physical Findings  weight is 211 lb 6.4 oz (95.89 kg). His oral temperature is 98 F (36.7 C). His blood pressure is 170/72 and his pulse is 52. His respiration is 18 and oxygen saturation is 98%. . Weight essentially stable.  No significant changes.  Impression The patient is tolerating radiation.  Plan He completed treatment today. He was given a one month follow up appointment.         Sheral Apley Tammi Klippel, M.D.

## 2015-09-27 NOTE — Progress Notes (Signed)
Weight and vitals stable. Denies pain. Reports discomfort in right hip and old chest tube site continues. Reports exertional dyspnea is unchanged. Reports an occasional cough with clear sputum. Denies hemoptysis. Denies difficulty swallowing. Denies skin changes within treatment. One month follow up appointment card given. Patient understands to contact this RN with future needs.   BP 170/72 mmHg  Pulse 52  Temp(Src) 98 F (36.7 C) (Oral)  Resp 18  Wt 211 lb 6.4 oz (95.89 kg)  SpO2 98% Wt Readings from Last 3 Encounters:  09/27/15 211 lb 6.4 oz (95.89 kg)  09/06/15 211 lb 8 oz (95.936 kg)  08/31/15 210 lb (95.255 kg)

## 2015-09-29 ENCOUNTER — Ambulatory Visit: Payer: Medicare Other | Admitting: Radiation Oncology

## 2015-09-29 NOTE — Progress Notes (Signed)
  Radiation Oncology         (336) [phone] ________________________________  Name: Benjamin [name] MRN: [id number]  Date: 09/27/2015  DOB: 05/25/1945  End of Treatment Note  Diagnosis: 70 y.o. 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  Beams/energy:   The patient was treated using stereotactic body radiotherapy according to a 3D conformal radiotherapy plan.  Volumetric arc fields were employed to deliver 6 MV X-rays.  Image guidance was performed with per fraction cone beam CT prior to treatment under personal MD supervision.  Immobilization was achieved using BodyFix Pillow.  Narrative: The patient tolerated radiation treatment relatively well. Reports exertional dyspnea is unchanged and an occasional cough with clear sputum.  Plan: The patient has completed radiation treatment. The patient will return to radiation oncology clinic for routine followup in one month. I advised them to call or return sooner if they have any questions or concerns related to their recovery or treatment. ________________________________  Sheral Apley. Tammi Klippel, M.D.  This document serves as a record of services personally performed by Tyler Pita, MD. It was created on his behalf by Darcus Austin, a trained medical scribe. The creation of this record is based on the scribe's personal observations and the provider's statements to them. This document has been checked and approved by the attending provider.

## 2015-09-30 DIAGNOSIS — F411 Generalized anxiety disorder: Secondary | ICD-10-CM | POA: Diagnosis not present

## 2015-10-07 ENCOUNTER — Encounter (INDEPENDENT_AMBULATORY_CARE_PROVIDER_SITE_OTHER): Payer: Self-pay

## 2015-10-07 ENCOUNTER — Encounter: Payer: Self-pay | Admitting: Cardiovascular Disease

## 2015-10-07 ENCOUNTER — Ambulatory Visit (INDEPENDENT_AMBULATORY_CARE_PROVIDER_SITE_OTHER): Payer: Medicare Other | Admitting: Cardiovascular Disease

## 2015-10-07 VITALS — BP 142/92 | HR 43 | Ht 71.0 in | Wt 208.1 lb

## 2015-10-07 DIAGNOSIS — E785 Hyperlipidemia, unspecified: Secondary | ICD-10-CM | POA: Diagnosis not present

## 2015-10-07 DIAGNOSIS — I482 Chronic atrial fibrillation, unspecified: Secondary | ICD-10-CM

## 2015-10-07 DIAGNOSIS — I2510 Atherosclerotic heart disease of native coronary artery without angina pectoris: Secondary | ICD-10-CM

## 2015-10-07 NOTE — Patient Instructions (Signed)

## 2015-10-07 NOTE — Progress Notes (Signed)
Benjamin [name] Date of Birth: 1946-01-26 Medical Record #[id number]   Problem List: 1. CAD - DES to RCA and prox LCx.  (10/16/12) 2. Paroxysmal Atrial fib: 3. Non-small cell lung ca. 4. Leg edema    Has CAD with known disease and prior LCX occlusion per cath in 2001 - follow up cath in 2008 showed recanalization of the lCX and otherwise nonobstructive disease, PAF, anxiety, mobitz type 1 second degree AV block - not on beta blocker therapy. Has also had lung cancer in the past treated with surgery and XRT.   Has had recent NSTEMI and had PCI/DES of the RCA and LCX. EF is 50 to 55% per cath. Was noted to have PAF during this admission - not currently on anticoagulation in the setting of dual antiplatelet therapy but this need to be addressed in the future. His CHADSVASc = 2.   Comes in today. Here with his wife. He is doing ok. Has been home about 5 weeks. Had a little chest discomfort initially - none over the past 3 weeks - no NTG use. Feels ok. Mostly limited by his breathing due to his lung cancer history/treatment. No bleeding or bruising. Not fasting today.   Nov. 3, 2014:  Jul 30, 2013:  Benjamin [name] is doing ok.  Has had some anxiety . Rare CP. Typically at rest.  Mild arm discomfort with walking .  He has had lots of lung surgeries ( non-small cell CA) and is limited by dyspnea.    August 20, 2013:  Benjamin [name] is doing well.  His left arm pain has improved.  His myoview was intermediate risk. - he has a scar in the Lcx distribution with possible periinfarct ischimia.  He had significant uptake in the bowel which made interpretation of the myoview difficult   He is chronically short of breath from his multiple lung surgeries. He really is not having any symptoms of angina.  Dec. 15, 2015:  Benjamin [name] is a 70 yo who I follow for CAD, paroxysmal A-fib, lung cancer - s/p multiple surgeries,  His HR is slow but no episodes of syncope or presyncope Has some ankle  swelling at the end of the  day.  He avoids salt.    September 14, 2014:  Doing well from a cardiac standpoint . Has some chronic ankle  edema .  Resolves during night  Has had chronic right ankle edema for years   Jan. 30, 2017:  Benjamin [name] is doing OK . is back in atrial fib - asymptomatic.   Has lung cancer.   Recent reports have been good .   Has had a slow HR for years   October 07, 2015:  Benjamin [name] is seen today for follow up . Has had XRT for recurrent  lung ca.    PET scan did not show any meds.  Feeling ok  Had a complication from a lung biopsy - pneumothorax , Had a chest tube placed .  Complicated by subcutaneous emphysemia  Is more short of breath.    Current Outpatient Prescriptions  Medication Sig Dispense Refill  . aspirin EC 81 MG EC tablet Take 1 tablet (81 mg total) by mouth daily.    Benjamin [name] Kitchen atorvastatin (LIPITOR) 20 MG tablet Take 20 mg by mouth daily at 6 PM.     . clonazePAM (KLONOPIN) 1 MG tablet Take 1 mg by mouth 3 (three) times daily.     . nitroGLYCERIN (NITROSTAT) 0.4 MG SL tablet Place 1 tablet (0.4  mg total) under the tongue every 5 (five) minutes x 3 doses as needed for chest pain. 25 tablet 3   No current facility-administered medications for this visit.    Allergies  Allergen Reactions  . Codeine Other (See Comments)    Dizziness, stomach pain    Past Medical History  Diagnosis Date  . Adenopathy     RIGHT ADRENAL  . Anxiety   . Arthritis     KNEES/HANDS  . Hx of melanoma of skin   . PAF (paroxysmal atrial fibrillation) ([place])     a. not currently on anticoagulation (09/2012)  . CAD (coronary artery disease)     a. Prior known CTO of LCX with recanalizationby cath 2008;  b. 09/2012 NSTEMI/Cath/PCI: LM nl, LAD 33m D1 50p, LCX 100p (2.75x16 Promus Premier DES), 80-980mOM1 30, RCA 9073m.0x16 Promus Premier DES), EF 50-55%.  . Non-small cell lung cancer ([facility]   a. s/p resection and XRT.    Past Surgical History  Procedure Laterality Date  . Lobectomy left upper lobe  08/15/2001   . Adrenalextomy  02/2000  . Right vats, mini thoracotomy, wedge resection of right upper  01/18/2004  . Cardiac catheterization  2008    LAD 40, D1 OK, D2 50, CFX 90, RCA OK, EF 60%; Consider PCI PRN but the stenosis is quite complex and we would end up jailing several large posterolateral branches    . Left heart catheterization with coronary angiogram N/A 10/17/2012    Procedure: LEFT HEART CATHETERIZATION WITH CORONARY ANGIOGRAM;  Surgeon: MicSherren MochaD;  [facility];  Service: Cardiovascular;  Laterality: N/A;    History  Smoking status  . Former Smoker -- 1.00 packs/day for 40 years  . Types: Cigarettes  . Quit date: 08/15/2001  Smokeless tobacco  . Never Used    History  Alcohol Use No    Family History  Problem Relation Age of Onset  . Heart attack Father   . Cancer Mother     lung  . Cancer Cousin     breast    Review of Systems: The review of systems is per the HPI.  All other systems were reviewed and are negative.  Physical Exam: BP 142/92 mmHg  Pulse 43  Ht '5\' 11"'$  (1.803 m)  Wt 208 lb 1.9 oz (94.403 kg)  BMI 29.04 kg/m2  SpO2 91% The patient is alert and oriented x 3.  The mood and affect are normal.  The skin is warm and dry.  Color is normal.   HEENT exam reveals that the sclera are nonicteric.  The mucous membranes are moist.  The carotids are 2+ without bruits.  There is no thyromegaly.  There is no JVD.   Lungs are clear.  The chest wall is non tender.   Cor:   reveals a regular rate with a normal S1 and S2.  .  Benjamin [name] KitchenHR is slow.   There are no murmurs, gallops, or rubs.  The PMI is not displaced.    Abdominal exam reveals good bowel sounds.  There is no guarding or rebound.  There is no hepatosplenomegaly or tenderness.  There are no masses.  Exam of the legs reveal no clubbing, cyanosis, 2 + edema on right.  Trace edema on left .  The legs are without rashes.  The distal pulses are intact.   Cranial nerves II - XII are intact.  Motor and  sensory functions are intact.  The gait is normal.  LABORATORY DATA: CBC/BMET and EKG pending  Lab Results  Component Value Date   WBC 5.8 08/24/2015   HGB 16.8 08/24/2015   HCT 50.3 08/24/2015   PLT 134* 08/24/2015   GLUCOSE 97 09/14/2014   CHOL 114 09/14/2014   TRIG 102.0 09/14/2014   HDL 40.60 09/14/2014   LDLCALC 53 09/14/2014   ALT 17 09/14/2014   AST 17 09/14/2014   NA 139 09/14/2014   K 4.1 09/14/2014   CL 105 09/14/2014   CREATININE 0.98 09/14/2014   BUN 12 09/14/2014   CO2 31 09/14/2014   TSH 0.774 10/16/2012   INR 1.11 08/24/2015   HGBA1C 5.5 10/16/2012    Lab Results  Component Value Date   TROPONINI 15.20* 10/17/2012    Angiographic Findings:  10/16/12  Left main: No obstructive disease.  Left Anterior Descending Artery: Large caliber vessel that courses to the apex. The mid vessel has diffuse 50% stenosis. There are several diagonal branches. The first diagonal branch is moderate in caliber with proximal 50% stenosis. The second and third diagonal branches are small in caliber with mild proximal disease.  Circumflex Artery: 100% proximal occlusion after a very early moderate caliber OM branch. The first OM branch has diffuse 30% stenosis. After PCI, the mid AV groove Circumflex is seen to fill three OM branches. The AV groove Circumflex has diffuse 80-90% stenosis between the takeoff of the second and third OM branches with same appearance as the cath in 2008.  Right Coronary Artery: Large caliber dominant vessel with hazy 90% mid stenosis with appearance of unstable plaque.   Left Ventricular Angiogram: LVEF=50-55%.   Impression:  1. Triple vessel CAD with NSTEMI secondary to acute occlusion of Circumflex.  2. Severe stenosis, unstable plaque mid RCA  3. NSTEMI  4. Preserved LV systolic function  5. Successful PTCA/DES x 1 mid RCA  6. Successful PTCA/DES x 1 proximal Circumflex   Recommendations: He will need dual anti-platelet therapy with ASA and  Effient for one year. Continue statin. No beta blocker with heart block.   Complications: None. The patient tolerated the procedure well.   ECG  Jan. 30, 2017:    Assessment / Plan:   1. CAD - DES to RCA and prox LCx.  (10/16/12) -  No angina , continue same meds   2. Paroxysmal Atrial fib: -    Stable , HR is slow   3. Non-small cell lung ca. -  Has recurrent lung cancer that was found in May, 2017, just finished XRT  4. Leg edema -   Chronic .   Likely is due to his pulmonary issues.   5. Hyperlipidemia:   Will check in 6 months .  7. Bradycardia:   HR is very slow but he has no symptoms .  Able to increase his HR with a step test.   Mertie Moores, MD  10/07/2015 11:22 AM    [facility] [address],  [place] , [place]  [address] Pager [phone] Phone: [phone]; Fax: [phone]

## 2015-11-02 ENCOUNTER — Ambulatory Visit
Admission: RE | Admit: 2015-11-02 | Discharge: 2015-11-02 | Disposition: A | Discharge status: Home / Self Care | Payer: Medicare Other | Source: Ambulatory Visit | Attending: Radiation Oncology | Admitting: Radiation Oncology

## 2015-11-02 ENCOUNTER — Ambulatory Visit (HOSPITAL_COMMUNITY)
Admission: RE | Admit: 2015-11-02 | Discharge: 2015-11-02 | Disposition: A | Discharge status: Home / Self Care | Payer: Medicare Other | Source: Ambulatory Visit | Attending: Radiation Oncology | Admitting: Radiation Oncology

## 2015-11-02 ENCOUNTER — Encounter (HOSPITAL_COMMUNITY): Payer: Self-pay

## 2015-11-02 ENCOUNTER — Encounter: Payer: Self-pay | Admitting: Radiation Oncology

## 2015-11-02 VITALS — BP 147/72 | HR 50 | Temp 97.4°F | Resp 18 | Ht 71.0 in | Wt 214.2 lb

## 2015-11-02 DIAGNOSIS — I700 Atherosclerosis of aorta: Secondary | ICD-10-CM | POA: Diagnosis not present

## 2015-11-02 DIAGNOSIS — R911 Solitary pulmonary nodule: Secondary | ICD-10-CM | POA: Insufficient documentation

## 2015-11-02 DIAGNOSIS — Z923 Personal history of irradiation: Secondary | ICD-10-CM | POA: Diagnosis not present

## 2015-11-02 DIAGNOSIS — C3431 Malignant neoplasm of lower lobe, right bronchus or lung: Secondary | ICD-10-CM | POA: Diagnosis not present

## 2015-11-02 DIAGNOSIS — C3411 Malignant neoplasm of upper lobe, right bronchus or lung: Secondary | ICD-10-CM | POA: Insufficient documentation

## 2015-11-02 DIAGNOSIS — I2510 Atherosclerotic heart disease of native coronary artery without angina pectoris: Secondary | ICD-10-CM | POA: Diagnosis not present

## 2015-11-02 DIAGNOSIS — C3412 Malignant neoplasm of upper lobe, left bronchus or lung: Secondary | ICD-10-CM | POA: Diagnosis not present

## 2015-11-02 LAB — BASIC METABOLIC PANEL
ANION GAP: 8 meq/L (ref 3–11)
BUN: 16.9 mg/dL (ref 7.0–26.0)
CHLORIDE: 106 meq/L (ref 98–109)
CO2: 28 mEq/L (ref 22–29)
Calcium: 9.1 mg/dL (ref 8.4–10.4)
Creatinine: 1 mg/dL (ref 0.7–1.3)
EGFR: 75 mL/min/{1.73_m2} — ABNORMAL LOW (ref >90)
Glucose: 79 mg/dl (ref 70–140)
POTASSIUM: 4.2 meq/L (ref 3.5–5.1)
Sodium: 142 mEq/L (ref 136–145)

## 2015-11-02 MED ORDER — IOPAMIDOL (ISOVUE-300) INJECTION 61%
75.0000 mL | Freq: Once | INTRAVENOUS | Status: AC | PRN
  Administered 2015-11-02: 75 mL via INTRAVENOUS

## 2015-11-02 NOTE — Addendum Note (Signed)
Encounter addended by: Malena Edman, RN on: 11/02/2015  3:50 PM<BR>    Actions taken: Charge Capture section accepted

## 2015-11-02 NOTE — Addendum Note (Signed)
Encounter addended by: Hayden Pedro, PA-C on: 11/02/2015  2:39 PM<BR>    Actions taken: Diagnosis association updated, Order Entry activity accessed

## 2015-11-02 NOTE — Progress Notes (Signed)
Radiation Oncology         [phone] ________________________________  Name: Benjamin [name] MRN: [id number]  Date: 11/02/2015  DOB: Jul 01, 1945  Follow-Up Visit Note  CC: Mathews Argyle, MD  Melrose Nakayama, *  Diagnosis:  Multiple Stage I, NSCLC of the left upper lobe, right upper lobe, left lower lobe, and now of the right lower lobe.   Interval Since Last Radiation:  1 month  09/20/15, 09/23/15, 09/27/15: SBRT to the right lower lobe was treated to 54 Gy in 3 fractions of 18 Gy.  June 03, 2007- July 05, 2007 62.5 Gy in 25 fractions of 2.5 Gy to left lower lung mass  Narrative:  The patient returns today for routine follow-up.  The patient tolerated radiotherapy very well, and for follow-up. He was under the impression that he would have a CT scan performed today. He denies any changes in his breathing reports that he continues to have shortness of breath with exertion, but this is unchanged since his previous lobectomy in 2008. He denies any difficulty with chest pain, shortness of breath at rest, fevers, or purulent mucus production. He does have some clear drainage that is postnasal in origin that he notes when shaving and tilting head back. He denies any hemoptysis. No other complaints or verbalized.                            ALLERGIES:  is allergic to codeine.  Meds: Current Outpatient Prescriptions  Medication Sig Dispense Refill  . aspirin EC 81 MG EC tablet Take 1 tablet (81 mg total) by mouth daily.    [name] atorvastatin (LIPITOR) 20 MG tablet Take 20 mg by mouth daily at 6 PM.     . clonazePAM (KLONOPIN) 1 MG tablet Take 1 mg by mouth 3 (three) times daily.     . nitroGLYCERIN (NITROSTAT) 0.4 MG SL tablet Place 1 tablet (0.4 mg total) under the tongue every 5 (five) minutes x 3 doses as needed for chest pain. (Patient not taking: Reported on 11/02/2015) 25 tablet 3   No current facility-administered medications for this encounter.     Physical Findings:  height  is '5\' 11"'$  (1.803 m) and weight is 214 lb 3.2 oz (97.2 kg). His oral temperature is 97.4 F (36.3 C). His blood pressure is 147/72 (abnormal) and his pulse is 50 (abnormal). His respiration is 18 and oxygen saturation is 97%.  In general this is a well appearing Caucasian male in no acute distress. He's alert and oriented x4 and appropriate throughout the examination. Cardiopulmonary assessment is negative for acute distress and he exhibits normal effort.     Lab Findings: Lab Results  Component Value Date   WBC 5.8 08/24/2015   HGB 16.8 08/24/2015   HCT 50.3 08/24/2015   MCV 89.2 08/24/2015   PLT 134 (L) 08/24/2015     Radiographic Findings: No results found.  Impression/Plan: 1. Multiple Stage I, NSCLC of the left upper lobe, right upper lobe, left lower lobe, and now of the right lower lobe. The patient appears to be doing well and has recovered from the effects of radiotherapy. We discussed that we would recommend her restaging scan and we'll coordinate this at his convenience. He would like to have was performed today and if his insurance authorized this I've discussed that this would be reasonable. I will follow up with the results of this by phone, and provided that these are stable results,  we will plan to see him back in 6 months time after his next surveillance scan. He states agreement and understanding.     Carola Rhine, PAC

## 2015-11-02 NOTE — Progress Notes (Signed)
[name] is here for a one month follow up visit for bronchoalveolar carcinoma right lung cancer.  Weight changes, if any:  Wt Readings from Last 3 Encounters:  10/07/15 208 lb 1.9 oz (94.4 kg)  09/27/15 211 lb 6.4 oz (95.9 kg)  09/06/15 211 lb 8 oz (95.9 kg)   Respiratory complaints, if any: SOB,coughing clear secretions Hemoptysis, if any: None  Swallowing Problems/Pain/Difficulty swallowing: Smoking Tobacco/Marijuana/Snuff/ETOH MKJ:IZXY 08-15-2001 40 years cigarettes 1p/d, no alcohol or drug usage Skin:Normal color to chest Pain : None Appetite: Good Fatigue:Having fatigue all during the day. When is next chemo scheduled?:None Lab work from of chart:10-07-15 Lipid profile and Cmet BP (!) 147/72 (BP Location: Right Arm, Patient Position: Sitting, Cuff Size: Normal)   Pulse (!) 50   Temp 97.4 F (36.3 C) (Oral)   Resp 18   Ht '5\' 11"'$  (1.803 m)   Wt 214 lb 3.2 oz (97.2 kg)   SpO2 97%   BMI 29.87 kg/m

## 2015-11-03 ENCOUNTER — Telehealth: Payer: Self-pay | Admitting: Radiation Oncology

## 2015-11-03 DIAGNOSIS — C3431 Malignant neoplasm of lower lobe, right bronchus or lung: Secondary | ICD-10-CM

## 2015-11-03 NOTE — Telephone Encounter (Signed)
LM for pt to review CT results. Would recommend repeat CT in 6 months to follow, but tumor is smaller since radiotherapy. Orders placed for repeat imaging.

## 2015-11-20 DIAGNOSIS — Z23 Encounter for immunization: Secondary | ICD-10-CM | POA: Diagnosis not present

## 2015-12-27 DIAGNOSIS — L821 Other seborrheic keratosis: Secondary | ICD-10-CM | POA: Diagnosis not present

## 2015-12-27 DIAGNOSIS — Z85820 Personal history of malignant melanoma of skin: Secondary | ICD-10-CM | POA: Diagnosis not present

## 2015-12-27 DIAGNOSIS — D225 Melanocytic nevi of trunk: Secondary | ICD-10-CM | POA: Diagnosis not present

## 2015-12-27 DIAGNOSIS — I872 Venous insufficiency (chronic) (peripheral): Secondary | ICD-10-CM | POA: Diagnosis not present

## 2015-12-27 DIAGNOSIS — L814 Other melanin hyperpigmentation: Secondary | ICD-10-CM | POA: Diagnosis not present

## 2015-12-29 ENCOUNTER — Telehealth: Payer: Self-pay | Admitting: Oncology

## 2015-12-29 ENCOUNTER — Telehealth: Payer: Self-pay | Admitting: *Deleted

## 2015-12-29 NOTE — Telephone Encounter (Signed)
10/12 Appointment rescheduled to 12/04 per patient request. The patient requested the first week of December.

## 2015-12-29 NOTE — Telephone Encounter (Addendum)
"  I need to cancel tomorrow's appointment."  Offered scheduler to reschedule.  Didn't think he needed F/U but accepted to reschedule.  Reports scans and appointments with other providers. But will come to [facility] [place] to talk with Dr. Benay Spice."  Transferred to ext [phone], next scheduled F/U will be 02-21-2016. *See routing comment.

## 2015-12-30 ENCOUNTER — Ambulatory Visit: Payer: Medicare Other | Admitting: Oncology

## 2016-02-21 ENCOUNTER — Ambulatory Visit: Payer: Medicare Other | Admitting: Oncology

## 2016-02-28 ENCOUNTER — Other Ambulatory Visit: Payer: Self-pay | Admitting: Thoracic Surgery (Cardiothoracic Vascular Surgery)

## 2016-02-28 DIAGNOSIS — C3490 Malignant neoplasm of unspecified part of unspecified bronchus or lung: Secondary | ICD-10-CM

## 2016-02-29 ENCOUNTER — Ambulatory Visit (INDEPENDENT_AMBULATORY_CARE_PROVIDER_SITE_OTHER): Payer: Medicare Other | Admitting: Thoracic Surgery (Cardiothoracic Vascular Surgery)

## 2016-02-29 ENCOUNTER — Encounter: Payer: Self-pay | Admitting: Thoracic Surgery (Cardiothoracic Vascular Surgery)

## 2016-02-29 ENCOUNTER — Ambulatory Visit
Admission: RE | Admit: 2016-02-29 | Discharge: 2016-02-29 | Disposition: A | Discharge status: Home / Self Care | Payer: Medicare Other | Source: Ambulatory Visit | Attending: Thoracic Surgery (Cardiothoracic Vascular Surgery) | Admitting: Thoracic Surgery (Cardiothoracic Vascular Surgery)

## 2016-02-29 VITALS — BP 136/80 | HR 71 | Resp 16 | Ht 71.0 in | Wt 214.0 lb

## 2016-02-29 DIAGNOSIS — I2510 Atherosclerotic heart disease of native coronary artery without angina pectoris: Secondary | ICD-10-CM | POA: Diagnosis not present

## 2016-02-29 DIAGNOSIS — Z85118 Personal history of other malignant neoplasm of bronchus and lung: Secondary | ICD-10-CM | POA: Diagnosis not present

## 2016-02-29 DIAGNOSIS — C3490 Malignant neoplasm of unspecified part of unspecified bronchus or lung: Secondary | ICD-10-CM

## 2016-02-29 NOTE — Progress Notes (Signed)
[address]       [name],[place] [address]             [phone]       HPI: Benjamin [name] returns for a scheduled follow-up visit  He is a 70 year old gentleman with a history of multiple lung cancers. 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 He had a right upper lobectomy in 2005 for a second non-small cell carcinoma. Both surgeries were done by Dr. Arlyce Dice. He then developed a third non-small cell cancer in the left lower lobe in 2009. That was treated with radiation. When Dr. Arlyce Dice retired I began following Benjamin [name].  A CT in 2014 showed chronic changes on the left from radiation and a groundglass opacity in the right lung which was unchanged. The groundglass opacity remained stable through April 2016. I saw him again in April 2017 and the groundglass opacity at increased in size. On PET CT there was minimal activity of 0.9, just barely above background.. There was no evidence of other disease.  A CT-guided needle biopsy was done on 08/24/2015. It showed adenocarcinoma in situ. He developed a pneumothorax after the biopsy. The pneumothorax never got very large, but he developed severe subcutaneous emphysema. Radiology placed a pigtail catheter. His air leak quickly resolved and the catheter was removed.  He went on to have stereotactic radiation by Dr. Tammi Klippel. He had 54 Gy in 3 fractions. He completed treatment on 09/27/2015.  He's been feeling well. He has chronic shortness of breath. That is unchanged. Not had any problems with radiation.   Past Medical History:  Diagnosis Date  . Adenopathy    RIGHT ADRENAL  . Anxiety   . Arthritis    KNEES/HANDS  . CAD (coronary artery disease)    a. Prior known CTO of LCX with recanalizationby cath 2008;  b. 09/2012 NSTEMI/Cath/PCI: LM nl, LAD 74m D1 50p, LCX 100p (2.75x16 Promus Premier DES), 80-972mOM1 30, RCA 9026m.0x16 Promus Premier DES), EF 50-55%.  . HMarland Kitchen of melanoma of skin   .  Non-small cell lung cancer ([facility]  a. s/p resection and XRT.  . PMarland KitchenF (paroxysmal atrial fibrillation) ([facility]  a. not currently on anticoagulation (09/2012)    Current Outpatient Prescriptions  Medication Sig Dispense Refill  . aspirin EC 81 MG EC tablet Take 1 tablet (81 mg total) by mouth daily.    . aMarland Kitchenorvastatin (LIPITOR) 20 MG tablet Take 20 mg by mouth daily at 6 PM.     . clonazePAM (KLONOPIN) 1 MG tablet Take 1 mg by mouth 3 (three) times daily.     . nitroGLYCERIN (NITROSTAT) 0.4 MG SL tablet Place 1 tablet (0.4 mg total) under the tongue every 5 (five) minutes x 3 doses as needed for chest pain. 25 tablet 3   No current facility-administered medications for this visit.     Physical Exam BP 136/80 (BP Location: Right Arm, Patient Position: Sitting, Cuff Size: Normal)   Pulse 71   Resp 16   Ht '5\' 11"'$  (1.803 m)   Wt 214 lb (97.1 kg)   BMI 29.85 Benjamin [name]  [age]ar old man in no acute distress Well-developed and well-nourished Alert and oriented 3 with no focal deficits No cervical or supraclavicular adenopathy Lungs diminished breath sounds bilaterally, no wheezing  Diagnostic Tests: CHEST  2 VIEW  COMPARISON:  Chest CT 11/02/2015, chest x-ray 08/28/2015  FINDINGS: Left upper lobe pleural based  density is unchanged. Left upper lobe surgical clips unchanged.  Linear density in the right lateral lung base Mary previa biopsy. This is more prominent than on prior studies. Prior biopsy result the right lung base nodule was adenocarcinoma.  Mild COPD with hyperinflation. Negative for pneumonia. Negative for heart failure or effusion.  IMPRESSION: Left upper lobe pleural based density is stable  Linear density right lung base is more prominent. Consider follow-up chest CT in height at the diagnosis of cancer in the right lower lobe.   Electronically Signed   By: Franchot Gallo M.D.   On: 02/29/2016 11:37 I personally reviewed the chest x-ray. I suspect the  linear density is related to his stereotactic radiation. He is scheduled to have a repeat CT in a couple months. I don't think there is any reason to be more aggressive with workup at this point.  Impression: 70 year old man who has now had multiple stage I non-small cell lung cancers. 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 He had stereotactic radiation for that.  He tolerated stereotactic radiation well. They were happy with the result. His chest x-ray today does show some scarring in that area. I suspect that is just post radiation change. I do not think there is any additional workup necessary at this time. He has a CT scheduled with radiation oncology in a couple months.  Plan: He is being followed by Dr. Tammi Klippel and Dr. Benay Spice. They will be doing his follow-up.   I will be happy to see him back any time in the future if I can be of any further assistance with his care.  Melrose Nakayama, MD [facility] [phone]

## 2016-03-28 DIAGNOSIS — F411 Generalized anxiety disorder: Secondary | ICD-10-CM | POA: Diagnosis not present

## 2016-04-05 ENCOUNTER — Ambulatory Visit: Payer: Medicare Other | Admitting: Cardiovascular Disease

## 2016-04-06 ENCOUNTER — Telehealth: Payer: Self-pay | Admitting: *Deleted

## 2016-04-06 NOTE — Telephone Encounter (Signed)
CALLED PATIENT TO INFORM OF LABS AND CT @ [facility] AND HIS FU VISIT ON 05-02-16 WITH ALISON PERKINS, LVM FOR A RETURN CALL

## 2016-04-07 ENCOUNTER — Telehealth: Payer: Self-pay | Admitting: Oncology

## 2016-04-07 ENCOUNTER — Telehealth: Payer: Self-pay | Admitting: Cardiovascular Disease

## 2016-04-07 NOTE — Telephone Encounter (Signed)
RESCHEDULED APPT WITH SCOTT WEAVER PAC  FOR  04-24-16 AT 10:45 AM  PT  WANTED TO CHANGE APPT  AS  HAD A LOT  OF  APPTS SCHEDULED  THAT  DAY WOULD  HAVE MADE FOR  A  LONG DAY  AND  PT  IS  BEING TREATED  FOR LUNG CA  FOR  THIRD TIME .WILL FORWARD TO DR Acie Fredrickson  .

## 2016-04-07 NOTE — Telephone Encounter (Signed)
Pt called to cxl 1/29 appt. Pt will r/s at later date

## 2016-04-07 NOTE — Telephone Encounter (Signed)
New Message     Does he really need the appointment on 05/01/16 ? Or can he push it out til April ? He only wants to see Dr Acie Fredrickson.

## 2016-04-17 ENCOUNTER — Ambulatory Visit: Payer: Medicare Other | Admitting: Oncology

## 2016-04-24 ENCOUNTER — Encounter: Payer: Self-pay | Admitting: Physician Assistant

## 2016-04-24 ENCOUNTER — Ambulatory Visit (INDEPENDENT_AMBULATORY_CARE_PROVIDER_SITE_OTHER): Payer: Medicare Other | Admitting: Physician Assistant

## 2016-04-24 ENCOUNTER — Encounter (INDEPENDENT_AMBULATORY_CARE_PROVIDER_SITE_OTHER): Payer: Self-pay

## 2016-04-24 VITALS — BP 150/70 | HR 44 | Ht 71.0 in | Wt 206.8 lb

## 2016-04-24 DIAGNOSIS — I2510 Atherosclerotic heart disease of native coronary artery without angina pectoris: Secondary | ICD-10-CM | POA: Diagnosis not present

## 2016-04-24 DIAGNOSIS — I481 Persistent atrial fibrillation: Secondary | ICD-10-CM | POA: Diagnosis not present

## 2016-04-24 DIAGNOSIS — R600 Localized edema: Secondary | ICD-10-CM

## 2016-04-24 DIAGNOSIS — E785 Hyperlipidemia, unspecified: Secondary | ICD-10-CM

## 2016-04-24 DIAGNOSIS — R609 Edema, unspecified: Secondary | ICD-10-CM | POA: Diagnosis not present

## 2016-04-24 DIAGNOSIS — R0602 Shortness of breath: Secondary | ICD-10-CM

## 2016-04-24 DIAGNOSIS — I4819 Other persistent atrial fibrillation: Secondary | ICD-10-CM

## 2016-04-24 MED ORDER — FUROSEMIDE 20 MG PO TABS: 20.0000 mg | ORAL_TABLET | Freq: Every day | ORAL | 11 refills | Status: DC | PRN

## 2016-04-24 NOTE — Progress Notes (Signed)
Cardiology Office Note:    Date:  04/24/2016   ID:  GERRETT [name], DOB 18-Oct-1945, MRN [id number]  PCP:  Mathews Argyle, MD  Cardiologist:  Dr. Liam Rogers   Electrophysiologist:  n/a TCTS:  Dr. Roxan Hockey Oncology: Dr. Benay Spice Radiation Oncologist: Dr. Tammi Klippel  Referring MD: Lajean Manes, MD   Chief Complaint  Patient presents with  . Follow-up    CAD, AFib    History of Present Illness:    Benjamin [name] is a 71 y.o. male with a hx of CAD status post non-STEMI treated with PCI with DES to the RCA and DES to the LCx in 7/14, atrial fibrillation, recurrent lung CA status post resection and radiation.  Last seen by Dr. Acie Fredrickson 7/17. He returns for follow-up. He tells me that he has had recurrent cancer since last seen. He underwent several rounds of radiation therapy. He has occasional atypical chest pains. These have been present for years without significant change. He denies exertional symptoms. He denies symptoms reminiscent of his previous angina. He has chronic dyspnea related to his lung issues. He denies orthopnea, PND. He notes chronic LE edema. This seems to be more dependent and worsens throughout the day. He denies syncope or near syncope. He denies any bleeding issues.  Prior CV studies that were reviewed today include:    Echo 12/15 Mild LVH, EF 60-65, no RWMA, Gr 1 DD, mild AI  Myoview 5/15 Intermediate risk stress nuclear study with a scar in the basal and mid inferolateral walls and apical lateral wall with possible mild periinfarct ischemia. The interpretantion of the study is affected by significant extracardiac activity. LV Ejection Fraction: Study not gated.  LHC 7/14 LAD mid 50, D1 proximal 50 LCx proximal 100, AV groove diffuse 80-90; OM1 30 RCA mid hazy 90 EF 50-55 PCI:  3 x 16 mm Promus Premier DES to the mid RCA PCI: 2.75 x 16 mm Promus Premier DES to the proximal LCx  Past Medical History:  Diagnosis Date  . Adenopathy    RIGHT ADRENAL    . Anxiety   . Arthritis    KNEES/HANDS  . CAD (coronary artery disease)    a. Prior known CTO of LCX with recanalizationby cath 2008;  b. 09/2012 NSTEMI/Cath/PCI: LM nl, LAD 48m D1 50p, LCX 100p (2.75x16 Promus Premier DES), 80-945mOM1 30, RCA 9023m.0x16 Promus Premier DES), EF 50-55%.  . HMarland Kitchen of melanoma of skin   . Non-small cell lung cancer ([facility]  a. s/p resection and XRT.  . PMarland KitchenF (paroxysmal atrial fibrillation) ([facility]  a. not currently on anticoagulation (09/2012)    Past Surgical History:  Procedure Laterality Date  . ADRENALEXTOMY  02/2000  . CARDIAC CATHETERIZATION  2008   LAD 40, D1 OK, D2 50, CFX 90, RCA OK, EF 60%; Consider PCI PRN but the stenosis is quite complex and we would end up jailing several large posterolateral branches    . LEFT HEART CATHETERIZATION WITH CORONARY ANGIOGRAM N/A 10/17/2012   Procedure: LEFT HEART CATHETERIZATION WITH CORONARY ANGIOGRAM;  Surgeon: MicSherren MochaD;  [facility];  Service: Cardiovascular;  Laterality: N/A;  . LOBECTOMY LEFT UPPER LOBE  08/15/2001  . Right VATS, mini thoracotomy, wedge resection of right upper  01/18/2004    Current Medications: Current Meds  Medication Sig  . aspirin EC 81 MG EC tablet Take 1 tablet (81 mg total) by mouth daily.  . aMarland Kitchenorvastatin (LIPITOR) 20 MG tablet Take 20 mg by mouth  daily at 6 PM.   . clonazePAM (KLONOPIN) 1 MG tablet Take 1 mg by mouth 3 (three) times daily.   . nitroGLYCERIN (NITROSTAT) 0.4 MG SL tablet Place 1 tablet (0.4 mg total) under the tongue every 5 (five) minutes x 3 doses as needed for chest pain.     Allergies:   Codeine   Social History   Social History  . Marital status: Divorced    Spouse name: N/A  . Number of children: N/A  . Years of education: N/A   Occupational History  . 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 Smoking status: Former Smoker    Packs/day: 1.00    Years: 40.00    Types: Cigarettes    Quit date: 08/15/2001  .  Smokeless tobacco: Never Used  . Alcohol use No  . Drug use: No  . Sexual activity: Not Currently   Other Topics Concern  . None   Social History Narrative  . None     Family History:  The patient's family history includes Cancer in his cousin and mother; Heart attack in his father.   ROS:   Please see the history of present illness.    Review of Systems  Cardiovascular: Positive for irregular heartbeat and leg swelling.  Psychiatric/Behavioral: The patient is nervous/anxious.    All other systems reviewed and are negative.   EKGs/Labs/Other Test Reviewed:    EKG:  EKG is  ordered today.  The ekg ordered today demonstrates AFib, HR 41  Recent Labs: 08/24/2015: Hemoglobin 16.8; Platelets 134 11/02/2015: BUN 16.9; Creatinine 1.0; Potassium 4.2; Sodium 142   Recent Lipid Panel    Component Value Date/Time   CHOL 114 09/14/2014 1159   TRIG 102.0 09/14/2014 1159   HDL 40.60 09/14/2014 1159   CHOLHDL 3 09/14/2014 1159   VLDL 20.4 09/14/2014 1159   LDLCALC 53 09/14/2014 1159     Physical Exam:    VS:  BP (!) 150/70   Pulse (!) 44   Ht '5\' 11"'$  (1.803 m)   Wt 206 lb 12.8 oz (93.8 kg)   BMI 28.84 kg/m     Wt Readings from Last 3 Encounters:  04/24/16 206 lb 12.8 oz (93.8 kg)  02/29/16 214 lb (97.1 kg)  11/02/15 214 lb 3.2 oz (97.2 kg)     Physical Exam  Constitutional: He is oriented to person, place, and time. He appears well-developed and well-nourished. No distress.  HENT:  Head: Normocephalic and atraumatic.  Eyes: No scleral icterus.  Neck: Normal range of motion. No JVD present.  Cardiovascular: S1 normal and S2 normal.  An irregularly irregular rhythm present. Bradycardia present.   No murmur heard. Pulmonary/Chest: Effort normal. He has decreased breath sounds. He has no wheezes. He has no rales.  Abdominal: Soft. There is no tenderness.  Musculoskeletal: He exhibits edema.  1+ bilateral ankle edema  Neurological: He is alert and oriented to person,  place, and time.  Skin: Skin is warm and dry.  Psychiatric: He has a normal mood and affect.    ASSESSMENT:    1. Coronary artery disease involving native coronary artery of native heart without angina pectoris   2. Persistent atrial fibrillation ([place])   3. Shortness of breath   4. Leg edema   5. Hyperlipidemia, unspecified hyperlipidemia type    PLAN:    In order of problems listed above:  1. CAD - s/p NSTEMI in 2014 tx with DES to RCA and DES to LCx.  He denies recurrent angina.  He is on ASA and Lipitor.  Continue current Rx.  2. Persistent AF - Slow VR.  This has been noted in the past. He is asymptomatic.  CHADS2-VASc=2 (> 56 yo, vascular dz).  He has also had several BPs above target but notes more optimal BPs at home.  So, the is a borderline CHADS2-VASc=3.  In any event, he would likely benefit from anticoagulation.  I discussed with Dr. Liam Rogers today by phone.  There has apparently been some concern in the past for higher bleeding risk given his recurrent cancer and radiation Rx.  I will review with his surgeon and oncologists to see if his bleeding risk is acceptable to attempt anticoagulation.  -  Continue current Rx  -  Will see if Dr. Roxan Hockey, Dr. Benay Spice, Dr. Tammi Klippel have any concerns about anticoagulation   3. Dyspnea - His shortness of breath is likely all related to his lung issues.  But, he has chronic LE edema.  He may have RV dysfunction related to chronic lung dz.  -  Obtain BMET, BNP >> add Lasix if BNP is elevated.  -  Obtain Echo  4. Leg edema - Check BMET, BNP and echo as noted.  Add Lasix if BNP elevated.  Venous insufficiency likely explains most of his swelling.  I have recommended compression stockings as well.   5. HL - Continue statin.    Medication Adjustments/Labs and Tests Ordered: Current medicines are reviewed at length with the patient today.  Concerns regarding medicines are outlined above.  Medication changes, Labs and Tests ordered  today are outlined in the Patient Instructions noted below. Patient Instructions  Medication Instructions:  1. START LASIX 20 MG DAILY AS NEEDED FOR SWELLING  Labwork: 1. BMET, BNP TODAY  Testing/Procedures: 1. Your physician has requested that you have an echocardiogram. Echocardiography is a painless test that uses sound waves to create images of your heart. It provides your doctor with information about the size and shape of your heart and how well your heart's chambers and valves are working. This procedure takes approximately one hour. There are no restrictions for this procedure.  Follow-Up: DR. Cathie Olden 3 MONTHS   Any Other Special Instructions Will Be Listed Below (If Applicable). PER Guenevere Roorda, PAC YOU ARE ADVISED TO PICK UP SOME OTC COMPRESSION STOCKING.   If you need a refill on your cardiac medications before your next appointment, please call your pharmacy.  Signed, Richardson Dopp, PA-C  04/24/2016 5:21 PM    [facility] [address], [place], [place]  [address] Phone: [phone]; Fax: [phone]

## 2016-04-24 NOTE — Patient Instructions (Addendum)
Medication Instructions:  1. START LASIX 20 MG DAILY AS NEEDED FOR SWELLING  Labwork: 1. BMET, BNP TODAY  Testing/Procedures: 1. Your physician has requested that you have an echocardiogram. Echocardiography is a painless test that uses sound waves to create images of your heart. It provides your doctor with information about the size and shape of your heart and how well your heart's chambers and valves are working. This procedure takes approximately one hour. There are no restrictions for this procedure.  Follow-Up: DR. Cathie Olden 3 MONTHS   Any Other Special Instructions Will Be Listed Below (If Applicable). PER SCOTT WEAVER, PAC YOU ARE ADVISED TO PICK UP SOME OTC COMPRESSION STOCKING.   If you need a refill on your cardiac medications before your next appointment, please call your pharmacy.

## 2016-04-25 ENCOUNTER — Telehealth: Payer: Self-pay | Admitting: *Deleted

## 2016-04-25 LAB — BASIC METABOLIC PANEL
BUN / CREAT RATIO: 15 (ref 10–24)
BUN: 15 mg/dL (ref 8–27)
CO2: 27 mmol/L (ref 18–29)
CREATININE: 0.99 mg/dL (ref 0.76–1.27)
Calcium: 8.8 mg/dL (ref 8.6–10.2)
Chloride: 101 mmol/L (ref 96–106)
GFR, EST AFRICAN AMERICAN: 87 mL/min/{1.73_m2} (ref >59)
GFR, EST NON AFRICAN AMERICAN: 75 mL/min/{1.73_m2} (ref >59)
Glucose: 95 mg/dL (ref 65–99)
Potassium: 4.3 mmol/L (ref 3.5–5.2)
SODIUM: 140 mmol/L (ref 134–144)

## 2016-04-25 LAB — PRO B NATRIURETIC PEPTIDE: NT-Pro BNP: 904 pg/mL — ABNORMAL HIGH (ref 0–376)

## 2016-04-25 NOTE — Telephone Encounter (Signed)
Lmtcb to go over results and recommendations.

## 2016-04-26 ENCOUNTER — Telehealth: Payer: Self-pay | Admitting: *Deleted

## 2016-04-26 MED ORDER — FUROSEMIDE 20 MG PO TABS: 20.0000 mg | ORAL_TABLET | Freq: Every day | ORAL | 3 refills | Status: DC

## 2016-04-26 NOTE — Telephone Encounter (Signed)
Ptcb and notified of lab results and findings by phone. Pt advised to change Lasix to 20 mg every day. BMET to be done in 1 week. Pt states he is already scheduled for lab work to be done at the [facility] 2/12. I will verify if BMET being done in order to not repeat lab work for pt. I will let Brynda Rim. PA know that pt having lab at [facility].[name]

## 2016-05-01 ENCOUNTER — Ambulatory Visit: Payer: Medicare Other | Admitting: Cardiovascular Disease

## 2016-05-01 ENCOUNTER — Ambulatory Visit (HOSPITAL_COMMUNITY)
Admission: RE | Admit: 2016-05-01 | Discharge: 2016-05-01 | Disposition: A | Discharge status: Home / Self Care | Payer: Medicare Other | Source: Ambulatory Visit | Attending: Radiation Oncology | Admitting: Radiation Oncology

## 2016-05-01 ENCOUNTER — Ambulatory Visit
Admission: RE | Admit: 2016-05-01 | Discharge: 2016-05-01 | Disposition: A | Discharge status: Home / Self Care | Payer: Medicare Other | Source: Ambulatory Visit | Attending: Radiation Oncology | Admitting: Radiation Oncology

## 2016-05-01 ENCOUNTER — Encounter (HOSPITAL_COMMUNITY): Payer: Self-pay

## 2016-05-01 DIAGNOSIS — C3431 Malignant neoplasm of lower lobe, right bronchus or lung: Secondary | ICD-10-CM | POA: Insufficient documentation

## 2016-05-01 DIAGNOSIS — J984 Other disorders of lung: Secondary | ICD-10-CM | POA: Diagnosis not present

## 2016-05-01 DIAGNOSIS — J439 Emphysema, unspecified: Secondary | ICD-10-CM | POA: Diagnosis not present

## 2016-05-01 DIAGNOSIS — Z923 Personal history of irradiation: Secondary | ICD-10-CM | POA: Insufficient documentation

## 2016-05-01 DIAGNOSIS — C3490 Malignant neoplasm of unspecified part of unspecified bronchus or lung: Secondary | ICD-10-CM | POA: Diagnosis not present

## 2016-05-01 LAB — BASIC METABOLIC PANEL
Anion Gap: 6 mEq/L (ref 3–11)
BUN: 14.8 mg/dL (ref 7.0–26.0)
CHLORIDE: 106 meq/L (ref 98–109)
CO2: 30 mEq/L — ABNORMAL HIGH (ref 22–29)
Calcium: 9.6 mg/dL (ref 8.4–10.4)
Creatinine: 1 mg/dL (ref 0.7–1.3)
EGFR: 75 mL/min/{1.73_m2} — ABNORMAL LOW (ref >90)
Glucose: 90 mg/dl (ref 70–140)
POTASSIUM: 4.8 meq/L (ref 3.5–5.1)
Sodium: 143 mEq/L (ref 136–145)

## 2016-05-01 MED ORDER — IOPAMIDOL (ISOVUE-300) INJECTION 61%
INTRAVENOUS | Status: AC
  Filled 2016-05-01: qty 75

## 2016-05-01 MED ORDER — IOPAMIDOL (ISOVUE-300) INJECTION 61%
75.0000 mL | Freq: Once | INTRAVENOUS | Status: AC | PRN
  Administered 2016-05-01: 75 mL via INTRAVENOUS

## 2016-05-01 MED ORDER — SODIUM CHLORIDE 0.9 % IJ SOLN
INTRAMUSCULAR | Status: AC
  Filled 2016-05-01: qty 50

## 2016-05-01 NOTE — Telephone Encounter (Signed)
Pt calling back with questions-pls call  [phone]

## 2016-05-01 NOTE — Telephone Encounter (Signed)
I spoke with the pt and he wanted to make sure that Benjamin [name] got in touch with oncology about his lab work for today.  I made him aware that it does look like Benjamin [name] was able to get in touch with [name] PA about obtaining a BMP today (per staff messages in Carol's basket).

## 2016-05-02 ENCOUNTER — Other Ambulatory Visit: Payer: Self-pay | Admitting: Radiation Oncology

## 2016-05-02 ENCOUNTER — Telehealth: Payer: Self-pay | Admitting: Physician Assistant

## 2016-05-02 ENCOUNTER — Encounter: Payer: Self-pay | Admitting: Radiation Oncology

## 2016-05-02 ENCOUNTER — Ambulatory Visit
Admission: RE | Admit: 2016-05-02 | Discharge: 2016-05-02 | Disposition: A | Discharge status: Home / Self Care | Payer: Medicare Other | Source: Ambulatory Visit | Attending: Radiation Oncology | Admitting: Radiation Oncology

## 2016-05-02 ENCOUNTER — Telehealth: Payer: Self-pay | Admitting: *Deleted

## 2016-05-02 VITALS — BP 157/77 | HR 58 | Temp 97.7°F | Resp 18 | Ht 71.0 in | Wt 207.6 lb

## 2016-05-02 DIAGNOSIS — I2510 Atherosclerotic heart disease of native coronary artery without angina pectoris: Secondary | ICD-10-CM | POA: Diagnosis not present

## 2016-05-02 DIAGNOSIS — M170 Bilateral primary osteoarthritis of knee: Secondary | ICD-10-CM | POA: Insufficient documentation

## 2016-05-02 DIAGNOSIS — I252 Old myocardial infarction: Secondary | ICD-10-CM | POA: Insufficient documentation

## 2016-05-02 DIAGNOSIS — Z902 Acquired absence of lung [part of]: Secondary | ICD-10-CM | POA: Diagnosis not present

## 2016-05-02 DIAGNOSIS — M19042 Primary osteoarthritis, left hand: Secondary | ICD-10-CM | POA: Insufficient documentation

## 2016-05-02 DIAGNOSIS — Z87891 Personal history of nicotine dependence: Secondary | ICD-10-CM | POA: Insufficient documentation

## 2016-05-02 DIAGNOSIS — Z803 Family history of malignant neoplasm of breast: Secondary | ICD-10-CM | POA: Diagnosis not present

## 2016-05-02 DIAGNOSIS — Z801 Family history of malignant neoplasm of trachea, bronchus and lung: Secondary | ICD-10-CM | POA: Insufficient documentation

## 2016-05-02 DIAGNOSIS — C3431 Malignant neoplasm of lower lobe, right bronchus or lung: Secondary | ICD-10-CM

## 2016-05-02 DIAGNOSIS — Z8249 Family history of ischemic heart disease and other diseases of the circulatory system: Secondary | ICD-10-CM | POA: Diagnosis not present

## 2016-05-02 DIAGNOSIS — Z885 Allergy status to narcotic agent status: Secondary | ICD-10-CM | POA: Diagnosis not present

## 2016-05-02 DIAGNOSIS — I480 Paroxysmal atrial fibrillation: Secondary | ICD-10-CM | POA: Insufficient documentation

## 2016-05-02 DIAGNOSIS — Z923 Personal history of irradiation: Secondary | ICD-10-CM | POA: Diagnosis not present

## 2016-05-02 DIAGNOSIS — Z85820 Personal history of malignant melanoma of skin: Secondary | ICD-10-CM | POA: Diagnosis not present

## 2016-05-02 DIAGNOSIS — C3490 Malignant neoplasm of unspecified part of unspecified bronchus or lung: Secondary | ICD-10-CM | POA: Diagnosis not present

## 2016-05-02 DIAGNOSIS — M19041 Primary osteoarthritis, right hand: Secondary | ICD-10-CM | POA: Diagnosis not present

## 2016-05-02 DIAGNOSIS — Z85118 Personal history of other malignant neoplasm of bronchus and lung: Secondary | ICD-10-CM | POA: Diagnosis not present

## 2016-05-02 DIAGNOSIS — Z955 Presence of coronary angioplasty implant and graft: Secondary | ICD-10-CM | POA: Insufficient documentation

## 2016-05-02 DIAGNOSIS — Z7982 Long term (current) use of aspirin: Secondary | ICD-10-CM | POA: Insufficient documentation

## 2016-05-02 DIAGNOSIS — Z7901 Long term (current) use of anticoagulants: Secondary | ICD-10-CM

## 2016-05-02 DIAGNOSIS — Z08 Encounter for follow-up examination after completed treatment for malignant neoplasm: Secondary | ICD-10-CM | POA: Diagnosis not present

## 2016-05-02 NOTE — Telephone Encounter (Signed)
I left a message for the patient to call back. I reviewed his case with his oncologist, thoracic surgeon and radiation oncologist.  All of them were fine with him starting on anticoagulation.  I reviewed this with Dr. Liam Rogers who agreed. As he has remained in atrial fibrillation and has significant risk factors for stroke, we would like him to start on anticoagulation to reduce his risk of stroke. PLAN: 1. DC Aspirin 2. Start Eliquis 5 mg Twice daily  3. FU with CVRR clinic in 6 weeks with CBC and BMET.  (reason for visit: patient new to anticoagulation). Richardson Dopp, PA-C   05/02/2016 5:09 PM

## 2016-05-02 NOTE — Progress Notes (Signed)
Benjamin [name]. [name] 71 y.o. Man with Multiple Stage I, NSCLC of the left upper lobe, right upper lobe, left lower lobe, and now of the right lower lobe.    Weight changes, if any: Wt Readings from Last 3 Encounters:  04/24/16 206 lb 12.8 oz (93.8 kg)  02/29/16 214 lb (97.1 kg)  11/02/15 214 lb 3.2 oz (97.2 kg)  Weight loss 8 pounds Respiratory complaints, if any: SOB, dry cough Hemoptysis, if any: None Swallowing Problems/Pain/Difficulty swallowing: Smoking Tobacco/Marijuana/Snuff/ETOH use:Former smoker x 40 years 1 p/d quit 08-15-2001: Appetite :Good eating two to three meals a day with snacks Pain:None When is next chemo scheduled?:None Lab work from of chart: Imaging:05-01-16 CT chest w contrast BP (!) 157/77   Pulse (!) 58   Temp 97.7 F (36.5 C) (Oral)   Resp 18   Ht '5\' 11"'$  (1.803 m)   Wt 207 lb 9.6 oz (94.2 kg)   SpO2 100%   BMI 28.95 kg/m Right arm sitting BP 144/77 Pulse 41               Right arm  standing

## 2016-05-02 NOTE — Telephone Encounter (Signed)
Left message to call back  

## 2016-05-02 NOTE — Telephone Encounter (Signed)
Lab results are in for bmet which was done at the [facility] [place] routed to [place] for his review.

## 2016-05-02 NOTE — Telephone Encounter (Signed)
Recent labs reviewed. Kidney function is stable. All other parameters are within acceptable limits and no further intervention or testing required. Continue with current treatment plan. Richardson Dopp, PA-C   05/02/2016 12:42 PM

## 2016-05-02 NOTE — Progress Notes (Addendum)
Radiation Oncology         [phone] ________________________________  Name: Benjamin [name] MRN: [id number]  Date: 05/02/2016  DOB: 09/14/1945  Follow-Up Visit Note  CC: Mathews Argyle, MD  Melrose Nakayama, *  Diagnosis:  Multifocal Stage I, NSCLC of the left upper lobe, right upper lobe, left lower lobe, and now of the right lower lobe.   Interval Since Last Radiation: 7 months  09/20/15, 09/23/15, 09/27/15: SBRT to the right lower lobe was treated to 54 Gy in 3 fractions of 18 Gy.  06/03/07- 07/05/07: 62.5 Gy in 25 fractions of 2.5 Gy to left lower lung mass  Narrative:  [name] is a pleasant 71 y.o. gentleman with a history of multifocal Stage I NSCLC, of the right and left upper lobes. He has previously undergone resection of the left upper lobe in 2003, as well as a resection of the right upper lobe in 2005,and does have some chronic issues with breathing as a result of lower lung volume. He completed his most recent course of SBRT in July 2017 to the right lower lobe, and comes today for surveillance. His post treatment CT in August revealed a slight decrease in the size of the right lower lobe, stability with post radiation change in the left lower lobe, and no evidence of new disease. He has returned for repeat imaging yesterday and comes today to review this CT which reveals post radiotherapy change along the right lower lobe without new nodules, and stability in his previously treated nodules.             On review of systems, the patient reports that he is doing well overall. He denies any chest pain, shortness of breath, cough, fevers, chills, night sweats, unintended weight changes. He denies any bowel or bladder disturbances, and denies abdominal pain, nausea or vomiting. He denies any new musculoskeletal or joint aches or pains, new skin lesions or concerns. A complete review of systems is obtained and is otherwise negative.      Past Medical History:  Past Medical  History:  Diagnosis Date  . Adenopathy    RIGHT ADRENAL  . Anxiety   . Arthritis    KNEES/HANDS  . CAD (coronary artery disease)    a. Prior known CTO of LCX with recanalizationby cath 2008;  b. 09/2012 NSTEMI/Cath/PCI: LM nl, LAD 39m D1 50p, LCX 100p (2.75x16 Promus Premier DES), 80-943mOM1 30, RCA 9041m.0x16 Promus Premier DES), EF 50-55%.  . HMarland Kitchen of melanoma of skin   . Non-[place] cell lung [place]  [place]. s/p resection and XRT.  . PMarland KitchenF (paroxysmal atrial fibrillation) ([place]  a. not currently on anticoagulation (09/2012)    Past Surgical History: Past Surgical History:  Procedure Laterality Date  . ADRENALEXTOMY  02/2000  . CARDIAC CATHETERIZATION  2008   LAD 40, D1 OK, D2 50, CFX 90, RCA OK, EF 60%; Consider PCI PRN but the stenosis is quite complex and we would end up jailing several large posterolateral branches    . LEFT HEART CATHETERIZATION WITH CORONARY ANGIOGRAM N/A 10/17/2012   Procedure: LEFT HEART CATHETERIZATION WITH CORONARY ANGIOGRAM;  Surgeon: MicSherren MochaD;  [facility];  Service: Cardiovascular;  Laterality: N/A;  . LOBECTOMY LEFT UPPER LOBE  08/15/2001  . Right VATS, mini thoracotomy, wedge resection of right upper  01/18/2004    Social History:  Social History   Social History  . Marital status: Divorced    Spouse  name: N/A  . Number of children: N/A  . Years of education: N/A   Occupational History  . 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 Smoking status: Former Smoker    Packs/day: 1.00    Years: 40.00    Types: Cigarettes    Quit date: 08/15/2001  . Smokeless tobacco: Never Used  . Alcohol use No  . Drug use: No  . Sexual activity: Not Currently   Other Topics Concern  . Not on file   Social History Narrative  . No narrative on file    Family History: Family History  Problem Relation Age of Onset  . Heart attack Father   . Cancer Mother     lung  . Cancer Cousin     breast    ALLERGIES:  is  allergic to codeine.  Meds: Current Outpatient Prescriptions  Medication Sig Dispense Refill  . aspirin EC 81 MG EC tablet Take 1 tablet (81 mg total) by mouth daily.    [name] atorvastatin (LIPITOR) 20 MG tablet Take 20 mg by mouth daily at 6 PM.     . clonazePAM (KLONOPIN) 1 MG tablet Take 1 mg by mouth 3 (three) times daily.     . furosemide (LASIX) 20 MG tablet Take 1 tablet (20 mg total) by mouth daily. 90 tablet 3  . nitroGLYCERIN (NITROSTAT) 0.4 MG SL tablet Place 1 tablet (0.4 mg total) under the tongue every 5 (five) minutes x 3 doses as needed for chest pain. (Patient not taking: Reported on 05/02/2016) 25 tablet 3   No current facility-administered medications for this encounter.     Physical Findings:  height is '5\' 11"'$  (1.803 m) and weight is 207 lb 9.6 oz (94.2 kg). His oral temperature is 97.7 F (36.5 C). His blood pressure is 157/77 (abnormal) and his pulse is 58 (abnormal). His respiration is 18 and oxygen saturation is 100%.   Pain Assessment Pain Score: 0-No pain/10 In general this is a well appearing caucasian male in no acute distress. He is alert and oriented x4 and appropriate throughout the examination. HEENT reveals that the patient is normocephalic, atraumatic. EOMs are intact. PERRLA. Skin is intact without any evidence of gross lesions. Cardiovascular exam reveals a regular rate and rhythm, no clicks rubs or murmurs are auscultated. Chest is clear to auscultation bilaterally. Lymphatic assessment is performed and does not reveal any adenopathy in the cervical, supraclavicular, axillary, or inguinal chains. Abdomen has active bowel sounds in all quadrants and is intact. The abdomen is soft, non tender, non distended. Lower extremities are negative for pretibial pitting edema, deep calf tenderness, cyanosis or clubbing.     Lab Findings: Lab Results  Component Value Date   WBC 5.8 08/24/2015   HGB 16.8 08/24/2015   HCT 50.3 08/24/2015   MCV 89.2 08/24/2015   PLT 134  (L) 08/24/2015     Radiographic Findings: Ct Chest W Contrast  Result Date: 05/02/2016 CLINICAL DATA:  Restaging lung cancer. Status post radiation therapy to a right lower lobe ground-glass nodule. EXAM: CT CHEST WITH CONTRAST TECHNIQUE: Multidetector CT imaging of the chest was performed during intravenous contrast administration. CONTRAST:  101m ISOVUE-300 IOPAMIDOL (ISOVUE-300) INJECTION 61% COMPARISON:  CT scan 11/02/2015 FINDINGS: Chest wall: No chest wall mass, supraclavicular or axillary lymphadenopathy. Stable mild thyromegaly. Cardiovascular: The heart is normal in size. No pericardial effusion. The aorta is normal in caliber. No dissection. Stable three-vessel coronary artery calcifications. Mediastinum/Nodes: No mediastinal or hilar mass or  lymphadenopathy. The esophagus is grossly normal. Lungs/Pleura: Stable large area of masslike peripheral fibrosis in the left upper lobe. No findings suspicious for recurrent tumor. Ill-defined radiation changes in the right lower lobe with nodularity. The ground-glass nodule is not well demonstrated. This is likely all radiation fibrosis. Peripheral density in the right upper lobe could represent an infiltrate. Persistent ill-defined density in the right upper lobe on image number 40. This measures 8 mm. Continued surveillance is recommended. Stable advanced emphysematous changes and areas of pulmonary scarring. Stable linear scarring changes in the right upper lobe. No pleural effusion. Upper Abdomen: No significant upper abdominal findings. Stable surgical changes involving the right abdomen from a prior right adrenalectomy. No findings suspicious for hepatic metastatic disease. Musculoskeletal: No significant bony findings. IMPRESSION: 1. Radiation changes involving the right lower lobe. Recommend continued surveillance. 2. Stable large masslike area of radiation fibrosis involving the left upper lobe. 3. Ill-defined 8 mm nodular density in the superior  segment of the right lower lobe. Recommend continued observation. 4. Patchy airspace opacity in the right upper lobe could reflect an infiltrate. 5. Stable emphysematous changes and pulmonary scarring. Electronically Signed   By: Marijo Sanes M.D.   On: 05/02/2016 08:04    Impression/Plan: 1. Multiple Stage I, NSCLC of the left upper lobe, right upper lobe, left lower lobe, and right lower lobe. The patient appears to be doing well since our last visit. I have recommended that he proceed with repeat imaging in 3 months time to confirm that the post radiotherapy changes in the right lower lung have not changed. He is in agreement. We will coordinate this, and follow up with him following this scan.      Carola Rhine, PAC

## 2016-05-02 NOTE — Telephone Encounter (Signed)
CALLED PATIENT TO INFORM OF CT ON 05-04-16- ARRIVAL TIME - 2:45 PM , PT. TO BE NPO- 4 HRS. PRIOR TO TEST @ [facility], LVM FOR A RETURN CALL

## 2016-05-03 ENCOUNTER — Telehealth: Payer: Self-pay | Admitting: Cardiovascular Disease

## 2016-05-03 MED ORDER — APIXABAN 5 MG PO TABS: 5.0000 mg | ORAL_TABLET | Freq: Two times a day (BID) | ORAL | 11 refills | Status: DC

## 2016-05-03 NOTE — Telephone Encounter (Signed)
Reviewed instructions with patient who verbalized understanding and agreement. He requests samples to be placed at front desk. He is scheduled for CVRR with lab appointment on 3/27. I advised him to monitor for bleeding in stool and to notify our office if he has any concerns. He thanked me for the call.

## 2016-05-03 NOTE — Telephone Encounter (Signed)
Spoke with patient who called to ask if eliquis comes in a cheaper generic. I advised that it does not but that I have included a card for the 1st 30 days free in addition to 2 boxes of samples. He states he will pick these up today. He is aware that our stock of samples is low and to call the office if he has a need for assistance.  We discussed treatment plan as advised by Richardson Dopp, PA on 2/5 as he had various questions about lasix and lab results. He verbalized understanding and agreement with plan of care and thanked me for the call.

## 2016-05-03 NOTE — Telephone Encounter (Signed)
New message      Pt has another question to ask the nurse.  Please call at your conveniece.

## 2016-05-03 NOTE — Telephone Encounter (Signed)
Patient is returning your call,thanks. °

## 2016-05-04 ENCOUNTER — Ambulatory Visit (HOSPITAL_COMMUNITY): Payer: Medicare Other

## 2016-05-04 NOTE — Telephone Encounter (Signed)
Left message to call back  

## 2016-05-05 NOTE — Telephone Encounter (Signed)
LMTCB

## 2016-05-05 NOTE — Telephone Encounter (Signed)
Discussed with patient

## 2016-05-11 ENCOUNTER — Other Ambulatory Visit: Payer: Self-pay

## 2016-05-11 ENCOUNTER — Ambulatory Visit (HOSPITAL_COMMUNITY): Payer: Medicare Other | Attending: Cardiology

## 2016-05-11 DIAGNOSIS — I351 Nonrheumatic aortic (valve) insufficiency: Secondary | ICD-10-CM | POA: Diagnosis not present

## 2016-05-11 DIAGNOSIS — R0602 Shortness of breath: Secondary | ICD-10-CM | POA: Diagnosis not present

## 2016-05-11 DIAGNOSIS — R9439 Abnormal result of other cardiovascular function study: Secondary | ICD-10-CM | POA: Diagnosis not present

## 2016-05-11 DIAGNOSIS — I517 Cardiomegaly: Secondary | ICD-10-CM | POA: Diagnosis not present

## 2016-05-11 DIAGNOSIS — R600 Localized edema: Secondary | ICD-10-CM

## 2016-05-12 ENCOUNTER — Telehealth: Payer: Self-pay | Admitting: *Deleted

## 2016-05-12 ENCOUNTER — Encounter: Payer: Self-pay | Admitting: Physician Assistant

## 2016-05-12 DIAGNOSIS — I480 Paroxysmal atrial fibrillation: Secondary | ICD-10-CM | POA: Diagnosis not present

## 2016-05-12 DIAGNOSIS — Z1389 Encounter for screening for other disorder: Secondary | ICD-10-CM | POA: Diagnosis not present

## 2016-05-12 DIAGNOSIS — Z0000 Encounter for general adult medical examination without abnormal findings: Secondary | ICD-10-CM | POA: Diagnosis not present

## 2016-05-12 DIAGNOSIS — Z79899 Other long term (current) drug therapy: Secondary | ICD-10-CM | POA: Diagnosis not present

## 2016-05-12 DIAGNOSIS — E7800 Pure hypercholesterolemia, unspecified: Secondary | ICD-10-CM | POA: Diagnosis not present

## 2016-05-12 DIAGNOSIS — D696 Thrombocytopenia, unspecified: Secondary | ICD-10-CM | POA: Diagnosis not present

## 2016-05-12 NOTE — Telephone Encounter (Signed)
DPR ok to leave detailed message on phone. The echocardiogram shows normal squeezing, pumping function. There is no significant valvular abnormality. Continue current Tx plan. Any questions feel free to call back [phone].

## 2016-05-15 ENCOUNTER — Telehealth: Payer: Self-pay | Admitting: Cardiovascular Disease

## 2016-05-15 NOTE — Telephone Encounter (Signed)
Left message for patient to call back  

## 2016-05-15 NOTE — Telephone Encounter (Signed)
New message    Pt is returning call to [place] about echo. He said he was just letting her know that he received her message. He wants to know what is next?

## 2016-05-17 ENCOUNTER — Telehealth: Payer: Self-pay | Admitting: *Deleted

## 2016-05-17 NOTE — Telephone Encounter (Signed)
On 05-17-16 fax medical records to [facility] , it was consult note, follow up note, sim & planning note, end of tx note

## 2016-05-22 ENCOUNTER — Telehealth: Payer: Self-pay | Admitting: *Deleted

## 2016-05-22 NOTE — Telephone Encounter (Signed)
CALLED PATIENT TO INFORM THAT FU APPT. HAS BEEN MOVED TO 10 AM ON 08-01-16, LVM FOR A RETURN CALL

## 2016-06-07 ENCOUNTER — Telehealth: Payer: Self-pay | Admitting: Cardiovascular Disease

## 2016-06-07 NOTE — Telephone Encounter (Signed)
New Message  Patient calling the office for samples of medication:   1.  What medication and dosage are you requesting samples for? Eliquis '5mg'$    2.  Are you currently out of this medication? yes

## 2016-06-07 NOTE — Telephone Encounter (Signed)
No eliquis samples are available. LMOM for patient to call the office.

## 2016-06-08 NOTE — Telephone Encounter (Signed)
Patient returning your call, thanks. °

## 2016-06-08 NOTE — Telephone Encounter (Signed)
Spoke with patient and made him aware that I would place samples at the front desk. I also informed him that samples are intended for new starts and not to maintain a patient on a medication. He stated that he is unable to afford the medication every month but he is willing to buy it some if we can assist with samples some as well. I mentioned to him that he can contact his insurance to find out if he is eligible for a tier reduction. If he is, he will call the office back and request that the nurse initiate the process. Patient very appreciative.

## 2016-06-08 NOTE — Telephone Encounter (Signed)
LMOM for patient to return my call.

## 2016-06-08 NOTE — Telephone Encounter (Signed)
Follow up   Pt returning call for samples for Eliquis

## 2016-06-13 ENCOUNTER — Ambulatory Visit (INDEPENDENT_AMBULATORY_CARE_PROVIDER_SITE_OTHER): Payer: Medicare Other | Admitting: Pharmacist

## 2016-06-13 ENCOUNTER — Other Ambulatory Visit: Payer: Medicare Other

## 2016-06-13 DIAGNOSIS — I2510 Atherosclerotic heart disease of native coronary artery without angina pectoris: Secondary | ICD-10-CM

## 2016-06-13 DIAGNOSIS — Z7901 Long term (current) use of anticoagulants: Secondary | ICD-10-CM

## 2016-06-13 LAB — BASIC METABOLIC PANEL
BUN / CREAT RATIO: 13 (ref 10–24)
BUN: 12 mg/dL (ref 8–27)
CALCIUM: 8.9 mg/dL (ref 8.6–10.2)
CO2: 25 mmol/L (ref 18–29)
CREATININE: 0.93 mg/dL (ref 0.76–1.27)
Chloride: 100 mmol/L (ref 96–106)
GFR calc Af Amer: 94 mL/min/{1.73_m2} (ref >59)
GFR calc non Af Amer: 81 mL/min/{1.73_m2} (ref >59)
Glucose: 93 mg/dL (ref 65–99)
Potassium: 4.4 mmol/L (ref 3.5–5.2)
Sodium: 142 mmol/L (ref 134–144)

## 2016-06-13 LAB — CBC
HEMATOCRIT: 46.8 % (ref 37.5–51.0)
Hemoglobin: 15.8 g/dL (ref 13.0–17.7)
MCH: 31 pg (ref 26.6–33.0)
MCHC: 33.8 g/dL (ref 31.5–35.7)
MCV: 92 fL (ref 79–97)
Platelets: 142 10*3/uL — ABNORMAL LOW (ref 150–379)
RBC: 5.09 x10E6/uL (ref 4.14–5.80)
RDW: 14.1 % (ref 12.3–15.4)
WBC: 6.2 10*3/uL (ref 3.4–10.8)

## 2016-06-13 NOTE — Progress Notes (Signed)
Pt was started on Eliquis for Afib on May 03, 2016.   Reviewed patients medication list.  Pt is not currently on any combined P-gp and strong CYP3A4 inhibitors/inducers (ketoconazole, traconazole, ritonavir, carbamazepine, phenytoin, rifampin, St. John's wort).  Reviewed labs.  SCr 0.93, Weight 94.1 kg , CrCl- 10m/min.  Dose is appropriate based on age, weight, and SCr.  Hgb 6.2 and HCT 46.8 stable and WNL .  A full discussion of the nature of anticoagulants has been carried out.  A benefit/risk analysis has been presented to the patient, so that they understand the justification for choosing anticoagulation with Eliquis at this time.  The need for compliance is stressed.  Pt is aware to take the medication twice daily.  Side effects of potential bleeding are discussed, including unusual colored urine or stools, coughing up blood or coffee ground emesis, nose bleeds or serious fall or head trauma.  Discussed signs and symptoms of stroke. The patient should avoid any OTC items containing aspirin or ibuprofen.  Avoid alcohol consumption.   Call if any signs of abnormal bleeding.  Discussed financial obligations and resolved any difficulty in obtaining medication.  Next lab test in 6 months.

## 2016-06-23 ENCOUNTER — Telehealth: Payer: Self-pay | Admitting: Cardiovascular Disease

## 2016-06-23 NOTE — Telephone Encounter (Signed)
New message    Pt is calling to find out about medicine coupons for Eliquis. He said his pharmacy told him to ask if the drug company has refill coupons.

## 2016-06-23 NOTE — Telephone Encounter (Signed)
Spoke with patient and made him aware that we only have copay cards which are only able to be used by patients with commercial insurance. He was very appreciative for my call and verbalized understanding.

## 2016-06-26 DIAGNOSIS — Z85820 Personal history of malignant melanoma of skin: Secondary | ICD-10-CM | POA: Diagnosis not present

## 2016-06-26 DIAGNOSIS — L720 Epidermal cyst: Secondary | ICD-10-CM | POA: Diagnosis not present

## 2016-06-26 DIAGNOSIS — L814 Other melanin hyperpigmentation: Secondary | ICD-10-CM | POA: Diagnosis not present

## 2016-06-26 DIAGNOSIS — D235 Other benign neoplasm of skin of trunk: Secondary | ICD-10-CM | POA: Diagnosis not present

## 2016-06-26 DIAGNOSIS — D1801 Hemangioma of skin and subcutaneous tissue: Secondary | ICD-10-CM | POA: Diagnosis not present

## 2016-06-26 DIAGNOSIS — L821 Other seborrheic keratosis: Secondary | ICD-10-CM | POA: Diagnosis not present

## 2016-07-10 ENCOUNTER — Telehealth: Payer: Self-pay | Admitting: Cardiovascular Disease

## 2016-07-10 NOTE — Telephone Encounter (Signed)
no samples here to give, pt aware & aware he needs to buy his medication as we have discussed prior. Per PT "April 2018 is the first actual RX that i have bought since Feb 2018" i explained that he would have to contiune to buy this medication. We can not do a every other month of samples with him. Pt expressed understanding.  Also explained to pt that he needs to check for Tier Reduction, PA forms & Ships, I explained to him that if he will work on that then we will be happy to help him along until we figure out if he can get some help. Pt in agreement with plan. Pt will call back when he is down to a week of medication and we will give him 1 box of Eliquis 5 mg & ALL paperwork for PA. Pt in agreement with this.

## 2016-07-10 NOTE — Telephone Encounter (Signed)
New message   Patient calling the office for samples of medication:   1.  What medication and dosage are you requesting samples for? Eliquis 5 mg-twice a day  2.  Are you currently out of this medication? no

## 2016-07-17 ENCOUNTER — Encounter (INDEPENDENT_AMBULATORY_CARE_PROVIDER_SITE_OTHER): Payer: Self-pay

## 2016-07-17 ENCOUNTER — Ambulatory Visit (INDEPENDENT_AMBULATORY_CARE_PROVIDER_SITE_OTHER): Payer: Medicare Other | Admitting: Cardiovascular Disease

## 2016-07-17 ENCOUNTER — Encounter: Payer: Self-pay | Admitting: Cardiovascular Disease

## 2016-07-17 VITALS — BP 180/100 | HR 40 | Ht 71.0 in | Wt 206.8 lb

## 2016-07-17 DIAGNOSIS — I5032 Chronic diastolic (congestive) heart failure: Secondary | ICD-10-CM

## 2016-07-17 DIAGNOSIS — I2510 Atherosclerotic heart disease of native coronary artery without angina pectoris: Secondary | ICD-10-CM | POA: Diagnosis not present

## 2016-07-17 DIAGNOSIS — I482 Chronic atrial fibrillation, unspecified: Secondary | ICD-10-CM

## 2016-07-17 MED ORDER — FUROSEMIDE 20 MG PO TABS: 40.0000 mg | ORAL_TABLET | Freq: Every day | ORAL | 3 refills | Status: DC

## 2016-07-17 MED ORDER — POTASSIUM CHLORIDE ER 10 MEQ PO TBCR: 10.0000 meq | EXTENDED_RELEASE_TABLET | Freq: Every day | ORAL | 3 refills | Status: DC

## 2016-07-17 NOTE — Progress Notes (Signed)
Benjamin [name] Date of Birth: 1945-04-20 Medical Record #[id number]   Problem List: 1. CAD - DES to RCA and prox LCx.  (10/16/12) 2. Paroxysmal Atrial fib: 3. Non-small cell lung ca. 4. Leg edema    Has CAD with known disease and prior LCX occlusion per cath in 2001 - follow up cath in 2008 showed recanalization of the lCX and otherwise nonobstructive disease, PAF, anxiety, mobitz type 1 second degree AV block - not on beta blocker therapy. Has also had lung cancer in the past treated with surgery and XRT.   Has had recent NSTEMI and had PCI/DES of the RCA and LCX. EF is 50 to 55% per cath. Was noted to have PAF during this admission - not currently on anticoagulation in the setting of dual antiplatelet therapy but this need to be addressed in the future. His CHADSVASc = 2.   Comes in today. Here with his wife. He is doing ok. Has been home about 5 weeks. Had a little chest discomfort initially - none over the past 3 weeks - no NTG use. Feels ok. Mostly limited by his breathing due to his lung cancer history/treatment. No bleeding or bruising. Not fasting today.   Nov. 3, 2014:  Jul 30, 2013:  Benjamin [name] is doing ok.  Has had some anxiety . Rare CP. Typically at rest.  Mild arm discomfort with walking .  He has had lots of lung surgeries ( non-small cell CA) and is limited by dyspnea.    August 20, 2013:  Benjamin [name] is doing well.  His left arm pain has improved.  His myoview was intermediate risk. - he has a scar in the Lcx distribution with possible periinfarct ischimia.  He had significant uptake in the bowel which made interpretation of the myoview difficult   He is chronically short of breath from his multiple lung surgeries. He really is not having any symptoms of angina.  Dec. 15, 2015:  Benjamin [name] is a 71 yo who I follow for CAD, paroxysmal A-fib, lung cancer - s/p multiple surgeries,  His HR is slow but no episodes of syncope or presyncope Has some ankle  swelling at the end of the  day.  He avoids salt.    September 14, 2014:  Doing well from a cardiac standpoint . Has some chronic ankle  edema .  Resolves during night  Has had chronic right ankle edema for years   Jan. 30, 2017:  Benjamin [name] is doing OK . is back in atrial fib - asymptomatic.   Has lung cancer.   Recent reports have been good .   Has had a slow HR for years   October 07, 2015:  Benjamin [name] is seen today for follow up . Has had XRT for recurrent  lung ca.    PET scan did not show any meds.  Feeling ok  Had a complication from a lung biopsy - pneumothorax , Had a chest tube placed .  Complicated by subcutaneous emphysemia  Is more short of breath.   July 17, 2016:  Benjamin [name] is seen today for follow up of his coronary artery disease and atrial fibrillation. He is been started on Eliquis . He seems to be doing well.  No bleeding since being on Eliquis .  His lung cancer seem to be stable.  We discussed his bradycardia He gets lightheaded. Near syncope with walking , has weak spell No syncope   Has had some leg swelling .  Current Outpatient Prescriptions  Medication Sig Dispense Refill  . apixaban (ELIQUIS) 5 MG TABS tablet Take 1 tablet (5 mg total) by mouth 2 (two) times daily. 60 tablet 11  . atorvastatin (LIPITOR) 20 MG tablet Take 20 mg by mouth daily at 6 PM.     . clonazePAM (KLONOPIN) 1 MG tablet Take 1 mg by mouth 3 (three) times daily.     . furosemide (LASIX) 20 MG tablet Take 1 tablet (20 mg total) by mouth daily. 90 tablet 3  . nitroGLYCERIN (NITROSTAT) 0.4 MG SL tablet Place 1 tablet (0.4 mg total) under the tongue every 5 (five) minutes x 3 doses as needed for chest pain. 25 tablet 3   No current facility-administered medications for this visit.     Allergies  Allergen Reactions  . Codeine Other (See Comments)    Dizziness, stomach pain    Past Medical History:  Diagnosis Date  . Adenopathy    RIGHT ADRENAL  . Anxiety   . Arthritis    KNEES/HANDS  . CAD (coronary artery  disease)    a. Prior known CTO of LCX with recanalizationby cath 2008;  b. 09/2012 NSTEMI/Cath/PCI: LM nl, LAD 13m D1 50p, LCX 100p (2.75x16 Promus Premier DES), 80-98mOM1 30, RCA 9096m.0x16 Promus Premier DES), EF 50-55%.  . HMarland Kitchenstory of echocardiogram    Echo 2/18: Moderate LVH, EF 55-60, normal wall motion, trivial AI, moderate LAE, mild RVE, mild RAE, mildly elevated pulmonary pressure  . Hx of melanoma of skin   . [place] [facility] [place]  [place]. s/p resection and XRT.  . PMarland KitchenF (paroxysmal atrial fibrillation) ([facility]  a. not currently on anticoagulation (09/2012)    Past Surgical History:  Procedure Laterality Date  . ADRENALEXTOMY  02/2000  . CARDIAC CATHETERIZATION  2008   LAD 40, D1 OK, D2 50, CFX 90, RCA OK, EF 60%; Consider PCI PRN but the stenosis is quite complex and we would end up jailing several large posterolateral branches    . LEFT HEART CATHETERIZATION WITH CORONARY ANGIOGRAM N/A 10/17/2012   Procedure: LEFT HEART CATHETERIZATION WITH CORONARY ANGIOGRAM;  Surgeon: MicSherren MochaD;  [facility];  Service: Cardiovascular;  Laterality: N/A;  . LOBECTOMY LEFT UPPER LOBE  08/15/2001  . Right VATS, mini thoracotomy, wedge resection of right upper  01/18/2004    History  Smoking Status  . Former Smoker  . Packs/day: 1.00  . Years: 40.00  . Types: Cigarettes  . Quit date: 08/15/2001  Smokeless Tobacco  . Never Used    History  Alcohol Use No    Family History  Problem Relation Age of Onset  . Heart attack Father   . Cancer Mother     lung  . Cancer Cousin     breast    Review of Systems: The review of systems is per the HPI.  All other systems were reviewed and are negative.  Physical Exam: BP (!) 180/100 (BP Location: Left Arm, Patient Position: Sitting, Cuff Size: Large)   Pulse (!) 40   Ht '5\' 11"'$  (1.803 m)   Wt 206 lb 12.8 oz (93.8 kg)   SpO2 90%   BMI 28.84 kg/m  The patient is alert and oriented x 3.  The mood and affect are  normal.  The skin is warm and dry.  Color is normal.   HEENT exam reveals that the sclera are nonicteric.  The mucous membranes are moist.  The carotids are 2+  without bruits.  There is no thyromegaly.  There is no JVD.   Lungs are clear.  The chest wall is non tender.   Cor:   reveals a regular rate with a normal S1 and S2.  [name]   HR is slow.   There are no murmurs, gallops, or rubs.  The PMI is not displaced.    Abdominal exam reveals good bowel sounds.  There is no guarding or rebound.  There is no hepatosplenomegaly or tenderness.  There are no masses.  Exam of the legs reveal no clubbing, cyanosis, 2 + edema on right.  Trace edema on left .  The legs are without rashes.  The distal pulses are intact.   Cranial nerves II - XII are intact.  Motor and sensory functions are intact.  The gait is normal.   LABORATORY DATA: CBC/BMET and EKG pending  Lab Results  Component Value Date   WBC 6.2 06/13/2016   HGB 16.8 08/24/2015   HCT 46.8 06/13/2016   PLT 142 (L) 06/13/2016   GLUCOSE 93 06/13/2016   CHOL 114 09/14/2014   TRIG 102.0 09/14/2014   HDL 40.60 09/14/2014   LDLCALC 53 09/14/2014   ALT 17 09/14/2014   AST 17 09/14/2014   NA 142 06/13/2016   K 4.4 06/13/2016   CL 100 06/13/2016   CREATININE 0.93 06/13/2016   BUN 12 06/13/2016   CO2 25 06/13/2016   TSH 0.774 10/16/2012   INR 1.11 08/24/2015   HGBA1C 5.5 10/16/2012    Lab Results  Component Value Date   TROPONINI 15.20 ([place]) 10/17/2012    Angiographic Findings:  10/16/12  Left main: No obstructive disease.  Left Anterior Descending Artery: Large caliber vessel that courses to the apex. The mid vessel has diffuse 50% stenosis. There are several diagonal branches. The first diagonal branch is moderate in caliber with proximal 50% stenosis. The second and third diagonal branches are small in caliber with mild proximal disease.  Circumflex Artery: 100% proximal occlusion after a very early moderate caliber OM branch. The first OM  branch has diffuse 30% stenosis. After PCI, the mid AV groove Circumflex is seen to fill three OM branches. The AV groove Circumflex has diffuse 80-90% stenosis between the takeoff of the second and third OM branches with same appearance as the cath in 2008.  Right Coronary Artery: Large caliber dominant vessel with hazy 90% mid stenosis with appearance of unstable plaque.   Left Ventricular Angiogram: LVEF=50-55%.   Impression:  1. Triple vessel CAD with NSTEMI secondary to acute occlusion of Circumflex.  2. Severe stenosis, unstable plaque mid RCA  3. NSTEMI  4. Preserved LV systolic function  5. Successful PTCA/DES x 1 mid RCA  6. Successful PTCA/DES x 1 proximal Circumflex   Recommendations: He will need dual anti-platelet therapy with ASA and Effient for one year. Continue statin. No beta blocker with heart block.   Complications: None. The patient tolerated the procedure well.   ECG    Assessment / Plan:   1. CAD - DES to RCA and prox LCx.  (10/16/12) -  No angina , continue same meds   2. Persistent Atrial fib: -    Stable , HR is slow  He has been started on Eliquis  5 mg twice a day since I last saw him. We have been given the clearance from his medical team to start him on this. He has recurrent lung cancer but overall this seems to be stable.  He has  had Atrial fib for a long time and I doubt that he would stay in NSR if we tried to cardiovert him.  This could be considered now that he is on DOAC.   He would likely need an antiarrhythmic and this may be risky given his slow HR.  He is having symptoms related to his slow HR  ( weakness, near-syncope , dizzyness)  I think we should consider him for pacer.  Will have him see EP   He has had recurrent lung cancer ( multiple recurrences )  and in the past I've been hesitant to start eliuqis.   We have been given the clearance to start Eliquis and he has done well without any bleeding    3. Non-small cell lung ca. -  Has  recurrent lung cancer that was found in May, 2017, just finished XRT  4. Leg edema -   Chronic .   Likely is due to his pulmonary issues vs. Presumed Diastolic CHF   5. Hyperlipidemia:   Will check in 3 months .  7. Bradycardia:   He has symptoms of near syncope and generalized weakness.  He may benefit from a  pacer .  Will refer to EP for evaluation .    Mertie Moores, MD  07/17/2016 1:59 PM    [facility] [address],  [place], [place]  [address] Pager [phone] Phone: [phone]; Fax: [phone]

## 2016-07-17 NOTE — Patient Instructions (Addendum)
Medication Instructions:  INCREASE Lasix (Furosemide) to 40 mg once daily START Kdur (potassium) to 10 meq once daily   Labwork: Your physician recommends that you return for lab work in: 3 weeks for basic metabolic panel   Testing/Procedures: None Ordered   Follow-Up: You have been referred to EP for evaluation of need for pacemaker  Your physician recommends that you schedule a follow-up appointment in: 3 months with Dr. Acie Fredrickson   If you need a refill on your cardiac medications before your next appointment, please call your pharmacy.   Thank you for choosing [facility]! Christen Bame, RN [phone]

## 2016-07-21 ENCOUNTER — Telehealth: Payer: Self-pay | Admitting: *Deleted

## 2016-07-21 NOTE — Telephone Encounter (Signed)
CALLED PATIENT TO ALTER FU APPT. FOR 08-01-16, MOVED TO 08-04-16 @ 10 AM,  LVM FOR A RETURN CALL

## 2016-07-24 ENCOUNTER — Encounter: Payer: Self-pay | Admitting: Internal Medicine

## 2016-07-28 ENCOUNTER — Ambulatory Visit
Admission: RE | Admit: 2016-07-28 | Discharge: 2016-07-28 | Disposition: A | Discharge status: Home / Self Care | Payer: Medicare Other | Source: Ambulatory Visit | Attending: Radiation Oncology | Admitting: Radiation Oncology

## 2016-07-28 ENCOUNTER — Ambulatory Visit (HOSPITAL_COMMUNITY)
Admission: RE | Admit: 2016-07-28 | Discharge: 2016-07-28 | Disposition: A | Discharge status: Home / Self Care | Payer: Medicare Other | Source: Ambulatory Visit | Attending: Urology | Admitting: Urology

## 2016-07-28 ENCOUNTER — Other Ambulatory Visit: Payer: Self-pay | Admitting: *Deleted

## 2016-07-28 DIAGNOSIS — R918 Other nonspecific abnormal finding of lung field: Secondary | ICD-10-CM | POA: Diagnosis not present

## 2016-07-28 DIAGNOSIS — Z923 Personal history of irradiation: Secondary | ICD-10-CM | POA: Insufficient documentation

## 2016-07-28 DIAGNOSIS — I2510 Atherosclerotic heart disease of native coronary artery without angina pectoris: Secondary | ICD-10-CM | POA: Insufficient documentation

## 2016-07-28 DIAGNOSIS — C3490 Malignant neoplasm of unspecified part of unspecified bronchus or lung: Secondary | ICD-10-CM | POA: Diagnosis not present

## 2016-07-28 DIAGNOSIS — I700 Atherosclerosis of aorta: Secondary | ICD-10-CM | POA: Insufficient documentation

## 2016-07-28 DIAGNOSIS — C3431 Malignant neoplasm of lower lobe, right bronchus or lung: Secondary | ICD-10-CM | POA: Insufficient documentation

## 2016-07-28 DIAGNOSIS — Z9889 Other specified postprocedural states: Secondary | ICD-10-CM | POA: Insufficient documentation

## 2016-07-28 DIAGNOSIS — J439 Emphysema, unspecified: Secondary | ICD-10-CM | POA: Insufficient documentation

## 2016-07-28 DIAGNOSIS — Y842 Radiological procedure and radiotherapy as the cause of abnormal reaction of the patient, or of later complication, without mention of misadventure at the time of the procedure: Secondary | ICD-10-CM | POA: Insufficient documentation

## 2016-07-28 LAB — BUN AND CREATININE (CC13)
BUN: 15.4 mg/dL (ref 7.0–26.0)
Creatinine: 1 mg/dL (ref 0.7–1.3)
EGFR: 76 mL/min/{1.73_m2} — ABNORMAL LOW (ref >90)

## 2016-07-28 MED ORDER — IOPAMIDOL (ISOVUE-300) INJECTION 61%
INTRAVENOUS | Status: AC
  Administered 2016-07-28: 75 mL
  Filled 2016-07-28: qty 75

## 2016-07-28 NOTE — Progress Notes (Signed)
Melanie Crazier. Pellow 71 y.o. 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  BP (!) 163/90 (BP Location: Right Arm, Patient Position: Sitting)   Pulse (!) 42   Temp 97.7 F (36.5 C) (Oral)   Ht '5\' 11"'$  (1.803 m)   Wt 208 lb (94.3 kg)   SpO2 96%   BMI 29.01 kg/m    Wt Readings from Last 3 Encounters:  08/04/16 208 lb (94.3 kg)  07/17/16 206 lb 12.8 oz (93.8 kg)  06/13/16 207 lb 8 oz (94.1 kg)

## 2016-08-01 ENCOUNTER — Ambulatory Visit: Admission: RE | Admit: 2016-08-01 | Payer: Medicare Other | Source: Ambulatory Visit | Admitting: Radiation Oncology

## 2016-08-04 ENCOUNTER — Encounter: Payer: Self-pay | Admitting: Radiation Oncology

## 2016-08-04 ENCOUNTER — Ambulatory Visit
Admission: RE | Admit: 2016-08-04 | Discharge: 2016-08-04 | Disposition: A | Discharge status: Home / Self Care | Payer: Medicare Other | Source: Ambulatory Visit | Attending: Radiation Oncology | Admitting: Radiation Oncology

## 2016-08-04 DIAGNOSIS — Z85118 Personal history of other malignant neoplasm of bronchus and lung: Secondary | ICD-10-CM | POA: Diagnosis not present

## 2016-08-04 DIAGNOSIS — C3431 Malignant neoplasm of lower lobe, right bronchus or lung: Secondary | ICD-10-CM

## 2016-08-04 DIAGNOSIS — Z902 Acquired absence of lung [part of]: Secondary | ICD-10-CM | POA: Diagnosis not present

## 2016-08-04 DIAGNOSIS — Z08 Encounter for follow-up examination after completed treatment for malignant neoplasm: Secondary | ICD-10-CM | POA: Diagnosis not present

## 2016-08-04 DIAGNOSIS — Z9889 Other specified postprocedural states: Secondary | ICD-10-CM | POA: Diagnosis not present

## 2016-08-04 NOTE — Progress Notes (Signed)
Radiation Oncology         [phone]) [phone] ________________________________  Name: Benjamin [name] MRN: [id number]  Date: 08/04/2016  DOB: [birth year]  Follow-Up Visit Note  CC: Lajean Manes, MD  Melrose Nakayama, *  Diagnosis:  Multifocal Stage I, NSCLC of the left upper lobe, right upper lobe, left lower lobe, and now of the right lower lobe.   Interval Since Last Radiation:  10 months  09/20/15, 09/23/15, 09/27/15: SBRT to the right lower lobe was treated to 54 Gy in 3 fractions of 18 Gy.  06/03/07- 07/05/07: 62.5 Gy in 25 fractions of 2.5 Gy to an inferior left upper lobe target  Narrative:  [name] is a pleasant 71 y.o. gentleman with a history of multifocal Stage I NSCLC, of the right and left upper lobes. He has previously undergone resection of the left upper lobe in 2003, as well as a resection of the right upper lobe in 2005,and does have some chronic issues with breathing as a result of lower lung volume. He completed his most recent course of SBRT in July 2017 to the right lower lobe, and comes today for surveillance. His post treatment CT in August revealed a slight decrease in the size of the right lower lobe, stability with post radiation change in the left lower lobe, and no evidence of new disease. In February 2018 he was found to have stable changes in the left upper lobe and right lower lobe consistent with prior treatments, but did have a 8 mm ill definite nodulary density in the superior segment of the right lower lobe. We decided to proceed with a short interval scan at 3 months. This was performed on 07/28/16 and revealed stability of the 31m nodules in the superior aspect of the right lower lobe. There was also ground glass attenuation in the RLL with a solid component and the component was previously 1.1 cm, now 2.1 cm. 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       On review of systems, the patient reports that he is doing well overall. He denies any chest pain,  shortness of breath, cough, fevers, chills, night sweats, unintended weight changes. He denies any bowel or bladder disturbances, and denies abdominal pain, nausea or vomiting. He denies any new musculoskeletal or joint aches or pains, new skin lesions or concerns. A complete review of systems is obtained and is otherwise negative.      Past Medical History:  Past Medical History:  Diagnosis Date  . Adenopathy    RIGHT ADRENAL  . Anxiety   . Arthritis    KNEES/HANDS  . CAD (coronary artery disease)    a. Prior known CTO of LCX with recanalizationby cath 2008;  b. 09/2012 NSTEMI/Cath/PCI: LM nl, LAD 571mD1 50p, LCX 100p (2.75x16 Promus Premier DES), 80-9030mM1 30, RCA 60m72m0x16 Promus Premier DES), EF 50-55%.  . HiMarland Kitchentory of echocardiogram    Echo 2/18: Moderate LVH, EF 55-60, normal wall motion, trivial AI, moderate LAE, mild RVE, mild RAE, mildly elevated pulmonary pressure  . Hx of melanoma of skin   . Non-small cell lung cancer (HCC)[place] a. s/p resection and XRT.  . PAMarland Kitchen (paroxysmal atrial fibrillation) (HCC)[place] a. not currently on anticoagulation (09/2012)    Past Surgical History: Past Surgical History:  Procedure Laterality Date  . ADRENALEXTOMY  02/2000  . CARDIAC CATHETERIZATION  2008   LAD 40, D1 OK, D2 50, CFX 90, RCA OK, EF  60%; Consider PCI PRN but the stenosis is quite complex and we would end up jailing several large posterolateral branches    . LEFT HEART CATHETERIZATION WITH CORONARY ANGIOGRAM N/A 10/17/2012   Procedure: LEFT HEART CATHETERIZATION WITH CORONARY ANGIOGRAM;  Surgeon: Sherren Mocha, MD;  Location: [facility] LAB;  Service: Cardiovascular;  Laterality: N/A;  . LOBECTOMY LEFT UPPER LOBE  08/15/2001  . Right VATS, mini thoracotomy, wedge resection of right upper  01/18/2004    Social History:  Social History   Social History  . Marital status: Divorced    Spouse name: N/A  . Number of children: N/A  . Years of education: N/A   Occupational History  .  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 Smoking status: Former Smoker    Packs/day: 1.00    Years: 40.00    Types: Cigarettes    Quit date: 08/15/2001  . Smokeless tobacco: Never Used  . Alcohol use No  . Drug use: No  . Sexual activity: Not Currently   Other Topics Concern  . Not on file   Social History Narrative  . No narrative on file    Family History: Family History  Problem Relation Age of Onset  . Heart attack Father   . Cancer Mother        lung  . Cancer Cousin        breast    ALLERGIES:  is allergic to codeine.  Meds: Current Outpatient Prescriptions  Medication Sig Dispense Refill  . apixaban (ELIQUIS) 5 MG TABS tablet Take 1 tablet (5 mg total) by mouth 2 (two) times daily. 60 tablet 11  . atorvastatin (LIPITOR) 20 MG tablet Take 20 mg by mouth daily at 6 PM.     . clonazePAM (KLONOPIN) 1 MG tablet Take 1 mg by mouth 3 (three) times daily.     . furosemide (LASIX) 20 MG tablet Take 2 tablets (40 mg total) by mouth daily. 180 tablet 3  . nitroGLYCERIN (NITROSTAT) 0.4 MG SL tablet Place 1 tablet (0.4 mg total) under the tongue every 5 (five) minutes x 3 doses as needed for chest pain. 25 tablet 3  . potassium chloride (K-DUR) 10 MEQ tablet Take 1 tablet (10 mEq total) by mouth daily. 90 tablet 3   No current facility-administered medications for this encounter.     Physical Findings:  height is '5\' 11"'$  (1.803 m) and weight is 208 lb (94.3 kg). His oral temperature is 97.7 F (36.5 C). His blood pressure is 163/90 (abnormal) and his pulse is 42 (abnormal). His oxygen saturation is 96%.   Pain Assessment Pain Score: 0-No pain/10 In general this is a well appearing caucasian male in no acute distress. He is alert and oriented x4 and appropriate throughout the examination. HEENT reveals that the patient is normocephalic, atraumatic. EOMs are intact. PERRLA. Skin is intact without any evidence of gross lesions. Cardiovascular exam reveals a  regular rate and rhythm, no clicks rubs or murmurs are auscultated. Chest is clear to auscultation bilaterally. Lymphatic assessment is performed and does not reveal any adenopathy in the cervical, supraclavicular, axillary, or inguinal chains. Abdomen has active bowel sounds in all quadrants and is intact. The abdomen is soft, non tender, non distended. Lower extremities are negative for pretibial pitting edema, deep calf tenderness, cyanosis or clubbing.     Lab Findings: Lab Results  Component Value Date   WBC 6.2 06/13/2016   HGB 16.8 08/24/2015   HCT 46.8 06/13/2016  MCV 92 06/13/2016   PLT 142 (L) 06/13/2016     Radiographic Findings: Ct Chest W Contrast  Result Date: 07/28/2016 CLINICAL DATA:  Restaging lung cancer. EXAM: CT CHEST WITH CONTRAST TECHNIQUE: Multidetector CT imaging of the chest was performed during intravenous contrast administration. CONTRAST:  <See Chart> ISOVUE-300 IOPAMIDOL (ISOVUE-300) INJECTION 61% COMPARISON:  05/01/2016 FINDINGS: Cardiovascular: Normal heart size. No pericardial effusion. Aortic atherosclerosis. Calcification within the RCA, LAD and left circumflex coronary artery noted. No pericardial effusion. Mediastinum/Nodes: The trachea appears patent and is midline. Normal appearance of the esophagus. No enlarged supraclavicular or axillary lymph nodes. No mediastinal or hilar adenopathy. Calcified mediastinal and hilar nodes are again noted. Lungs/Pleura: No pleural fluid identified. Scarring with central masslike architectural distortion within the lateral segment of right lower lobe is again identified, image 90 of series 7. This is unchanged when compared with the previous exam. 8 mm nodule within the superior segment of right lower lobe is unchanged, image 49 of series 7. Within the right lower lobe there is an area of ground-glass attenuation with central solid component, image 76 of series 7. The central solid component currently measures 2.1 cm, image  76 of series 7 versus 1.1 cm previously. Postsurgical change in the anterior right upper lobe is stable. Masslike area of architectural distortion within the posterolateral left upper lobe is stable measuring 5.7 x 3.7 x 5.0 cm, image 48 of series 7. Upper Abdomen: Status post right adrenalectomy. Normal appearance of the left adrenal gland. No acute abnormality identified within the upper abdomen. Musculoskeletal: There is degenerative disc disease within the lower thoracic spine. No aggressive lytic or sclerotic bone lesions. IMPRESSION: 1. Posterolateral subpleural masslike architectural distortion within the left upper lobe is unchanged when compared with previous exam. Findings are compatible with previously described radiation change. 2. Radiation changes involving the right lower lobe are also stable in the interval. 3. Within the right middle lobe there is a focal area of ground-glass attenuation with an enlarging solid component. This is indeterminate and warrants close interval follow-up. Suggest followup imaging in 3 months to assess for any temporal change. A PET-CT may be warranted to assess for hypermetabolism associated with the solid component of this indeterminate lesion. 4. Aortic Atherosclerosis (ICD10-I70.0) and Emphysema (ICD10-J43.9). 5. Multi vessel coronary artery calcification. Electronically Signed   By: Kerby Moors M.D.   On: 07/28/2016 13:47    Impression/Plan: 1. Multiple Stage I, NSCLC of the left upper lobe, right upper lobe, left lower lobe, and right lower lobe. The patient appears to be doing well clinically. After reviewing his results from his last CT scan, we discussed options for proceeding with repeat CT in 3 months versus proceeding with a PET scan. He is in agreement at this time with CT in 3 months, and we discussed the risks of waiting if this indeed represented early cancerous progression. He will call if he has questions or concerns regarding our discussion.       Carola Rhine, PAC

## 2016-08-07 ENCOUNTER — Ambulatory Visit: Payer: Medicare Other | Admitting: Cardiovascular Disease

## 2016-08-09 ENCOUNTER — Ambulatory Visit (INDEPENDENT_AMBULATORY_CARE_PROVIDER_SITE_OTHER): Payer: Medicare Other | Admitting: Internal Medicine

## 2016-08-09 ENCOUNTER — Other Ambulatory Visit: Payer: Medicare Other

## 2016-08-09 ENCOUNTER — Encounter: Payer: Self-pay | Admitting: Internal Medicine

## 2016-08-09 ENCOUNTER — Telehealth: Payer: Self-pay | Admitting: Internal Medicine

## 2016-08-09 VITALS — BP 144/80 | HR 34 | Ht 71.0 in | Wt 209.0 lb

## 2016-08-09 DIAGNOSIS — I5032 Chronic diastolic (congestive) heart failure: Secondary | ICD-10-CM | POA: Diagnosis not present

## 2016-08-09 DIAGNOSIS — R001 Bradycardia, unspecified: Secondary | ICD-10-CM

## 2016-08-09 DIAGNOSIS — Z01812 Encounter for preprocedural laboratory examination: Secondary | ICD-10-CM

## 2016-08-09 NOTE — Patient Instructions (Addendum)
Medication Instructions:  Your physician recommends that you continue on your current medications as directed. Please refer to the Current Medication list given to you today.   Labwork: BMET / [place] office on 08/23/16   Testing/Procedures:  Your physician has recommended that you have a His Bundle pacemaker inserted. A pacemaker is a small device that is placed under the skin of your chest or abdomen to help control abnormal heart rhythms. This device uses electrical pulses to prompt the heart to beat at a normal rate. Pacemakers are used to treat heart rhythms that are too slow. Wire (leads) are attached to the pacemaker that goes into the chambers of you heart. This is done in the hospital and usually requires and overnight stay. Please see the instruction sheet given to you today for more information.     Follow-Up: 10-14 days after the procedure (08/29/16) for a wound check in the [facility] and 91 days with Dr. Lovena Le.  Special Instructions:   Please report to the [facility] on 08/29/16 at 5:30 AM    Use CHG Soap night before and morning of procedure  Nothing to eat or drink after midnight the night prior to the procedure  Do not take any medication the morning of the procedure  Plan a 1 night stay      If you need a refill on your cardiac medications before your next appointment, please call your pharmacy.

## 2016-08-09 NOTE — Progress Notes (Signed)
HPI Mr. Centrella is referred today by Dr. 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 He is a pleasant 71 yo man with multiple medical problems. He has had recurrent lung CA, s/p surgery and radiation treatment. He is clinically in remission. He has a h/o CAD, s/p MI with no angina. He has had longstanding atrial fibrillation and bradycardia which appears to have worsened. He has never had syncope but does get dizzy with exertion. He has class 2B to 3 CHF symptoms with a h/o normal LV function by echo. He has peripheral edema. Allergies  Allergen Reactions  . Codeine Other (See Comments)    Dizziness, stomach pain     Current Outpatient Prescriptions  Medication Sig Dispense Refill  . apixaban (ELIQUIS) 5 MG TABS tablet Take 1 tablet (5 mg total) by mouth 2 (two) times daily. 60 tablet 11  . atorvastatin (LIPITOR) 20 MG tablet Take 20 mg by mouth daily at 6 PM.     . clonazePAM (KLONOPIN) 1 MG tablet Take 1 mg by mouth 3 (three) times daily.     . furosemide (LASIX) 20 MG tablet Take 2 tablets (40 mg total) by mouth daily. 180 tablet 3  . nitroGLYCERIN (NITROSTAT) 0.4 MG SL tablet Place 1 tablet (0.4 mg total) under the tongue every 5 (five) minutes x 3 doses as needed for chest pain. 25 tablet 3  . potassium chloride (K-DUR) 10 MEQ tablet Take 1 tablet (10 mEq total) by mouth daily. 90 tablet 3   No current facility-administered medications for this visit.      Past Medical History:  Diagnosis Date  . Adenopathy    RIGHT ADRENAL  . Anxiety   . Arthritis    KNEES/HANDS  . CAD (coronary artery disease)    a. Prior known CTO of LCX with recanalizationby cath 2008;  b. 09/2012 NSTEMI/Cath/PCI: LM nl, LAD 63m, D1 50p, LCX 100p (2.75x16 Promus Premier DES), 80-31m, OM1 30, RCA 75m (3.0x16 Promus Premier DES), EF 50-55%.  [name] History of echocardiogram    Echo 2/18: Moderate LVH, EF 55-60, normal wall motion, trivial AI, moderate LAE, mild RVE,  mild RAE, mildly elevated pulmonary pressure  . Hx of melanoma of skin   . Non-small cell lung cancer ([place])    a. s/p resection and XRT.  [name] PAF (paroxysmal atrial fibrillation) ([place])    a. not currently on anticoagulation (09/2012)    ROS:   All systems reviewed and negative except as noted in the HPI.   Past Surgical History:  Procedure Laterality Date  . ADRENALEXTOMY  02/2000  . CARDIAC CATHETERIZATION  2008   LAD 40, D1 OK, D2 50, CFX 90, RCA OK, EF 60%; Consider PCI PRN but the stenosis is quite complex and we would end up jailing several large posterolateral branches    . LEFT HEART CATHETERIZATION WITH CORONARY ANGIOGRAM N/A 10/17/2012   Procedure: LEFT HEART CATHETERIZATION WITH CORONARY ANGIOGRAM;  Surgeon: Sherren Mocha, MD;  Location: [facility] CATH LAB;  Service: Cardiovascular;  Laterality: N/A;  . LOBECTOMY LEFT UPPER LOBE  08/15/2001  . Right VATS, mini thoracotomy, wedge resection of right upper  01/18/2004     Family History  Problem Relation Age of Onset  . Heart attack Father   . Cancer Mother        lung  . Cancer Cousin        breast     Social History   Social  History  . Marital status: Divorced    Spouse name: N/A  . Number of children: N/A  . Years of education: N/A   Occupational History  . 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 Smoking status: Former Smoker    Packs/day: 1.00    Years: 40.00    Types: Cigarettes    Quit date: 08/15/2001  . Smokeless tobacco: Never Used  . Alcohol use No  . Drug use: No  . Sexual activity: Not Currently   Other Topics Concern  . Not on file   Social History Narrative  . No narrative on file     BP (!) 144/80   Pulse (!) 34   Ht 5\' 11"  (1.803 m)   Wt 209 lb (94.8 kg)   SpO2 97%   BMI 29.15 kg/m   Physical Exam:  Well appearing 71 yo man, NAD HEENT: Unremarkable Neck:  6 cm JVD, no thyromegally Lymphatics:  No adenopathy Back:  No CVA tenderness Lungs:  Clear with no  wheezes HEART:  Regular brady rhythm, no murmurs, no rubs, no clicks Abd:  soft, positive bowel sounds, no organomegally, no rebound, no guarding Ext:  2 plus pulses, 2+ edema, no cyanosis, no clubbing Skin:  No rashes no nodules Neuro:  CN II through XII intact, motor grossly intact  EKG - atrial fib with CHB at 33/min  Assess/Plan: 1. Atrial fib with a slow VR - He is slow and symptomatic.  2. Coags - he will continue his Eliquis but hold it for his PPM 3. CHB - he has complete heart block with underlying atrial fib. I have recommended insertion of a His bundle PPM.  4. HTN - his blood pressure is elevated a bit. I would anticipate uptitration of his BP meds including adding a beta blocker or calcium channel blocker after his PPM is inserted.  Mikle Bosworth.D.

## 2016-08-09 NOTE — Telephone Encounter (Signed)
New message    Patient calling the office for samples of medication:   1.  What medication and dosage are you requesting samples for?apixaban (ELIQUIS) 5 MG TABS tablet 2.  Are you currently out of this medication? Yes   Pt comes in for an appointment this morning with Dr. Lovena Le and wants to know if he can possibly pick up some samples for Eliquis during his office visit at 11 AM

## 2016-08-09 NOTE — Telephone Encounter (Signed)
Matter addressed at office visit today, with Lavella Lemons, [place].

## 2016-08-23 ENCOUNTER — Other Ambulatory Visit: Payer: Medicare Other | Admitting: *Deleted

## 2016-08-23 DIAGNOSIS — R001 Bradycardia, unspecified: Secondary | ICD-10-CM

## 2016-08-23 DIAGNOSIS — Z01812 Encounter for preprocedural laboratory examination: Secondary | ICD-10-CM

## 2016-08-24 LAB — CBC WITH DIFFERENTIAL/PLATELET
BASOS ABS: 0 10*3/uL (ref 0.0–0.2)
Basos: 0 %
EOS (ABSOLUTE): 0.1 10*3/uL (ref 0.0–0.4)
EOS: 1 %
Hematocrit: 46.6 % (ref 37.5–51.0)
Hemoglobin: 15.4 g/dL (ref 13.0–17.7)
IMMATURE GRANULOCYTES: 0 %
Immature Grans (Abs): 0 10*3/uL (ref 0.0–0.1)
Lymphocytes Absolute: 0.9 10*3/uL (ref 0.7–3.1)
Lymphs: 10 %
MCH: 29.2 pg (ref 26.6–33.0)
MCHC: 33 g/dL (ref 31.5–35.7)
MCV: 88 fL (ref 79–97)
MONOCYTES: 8 %
Monocytes Absolute: 0.7 10*3/uL (ref 0.1–0.9)
NEUTROS PCT: 81 %
Neutrophils Absolute: 7.9 10*3/uL — ABNORMAL HIGH (ref 1.4–7.0)
PLATELETS: 141 10*3/uL — AB (ref 150–379)
RBC: 5.27 x10E6/uL (ref 4.14–5.80)
RDW: 14 % (ref 12.3–15.4)
WBC: 9.6 10*3/uL (ref 3.4–10.8)

## 2016-08-24 LAB — BASIC METABOLIC PANEL
BUN/Creatinine Ratio: 17 (ref 10–24)
BUN: 15 mg/dL (ref 8–27)
CALCIUM: 8.8 mg/dL (ref 8.6–10.2)
CHLORIDE: 101 mmol/L (ref 96–106)
CO2: 25 mmol/L (ref 18–29)
Creatinine, Ser: 0.89 mg/dL (ref 0.76–1.27)
GFR calc non Af Amer: 85 mL/min/{1.73_m2} (ref >59)
GFR, EST AFRICAN AMERICAN: 98 mL/min/{1.73_m2} (ref >59)
Glucose: 100 mg/dL — ABNORMAL HIGH (ref 65–99)
POTASSIUM: 4.2 mmol/L (ref 3.5–5.2)
SODIUM: 141 mmol/L (ref 134–144)

## 2016-08-29 ENCOUNTER — Encounter (HOSPITAL_COMMUNITY): Payer: Self-pay | Admitting: Family

## 2016-08-29 ENCOUNTER — Encounter (HOSPITAL_COMMUNITY)
Admission: RE | Disposition: A | Discharge status: Home / Self Care | Payer: Self-pay | Source: Ambulatory Visit | Attending: Internal Medicine

## 2016-08-29 ENCOUNTER — Observation Stay (HOSPITAL_COMMUNITY)
Admission: RE | Admit: 2016-08-29 | Discharge: 2016-08-30 | Disposition: A | Discharge status: Home / Self Care | Payer: Medicare Other | Source: Ambulatory Visit | Attending: Internal Medicine | Admitting: Internal Medicine

## 2016-08-29 DIAGNOSIS — Z923 Personal history of irradiation: Secondary | ICD-10-CM | POA: Diagnosis not present

## 2016-08-29 DIAGNOSIS — I509 Heart failure, unspecified: Secondary | ICD-10-CM | POA: Diagnosis not present

## 2016-08-29 DIAGNOSIS — I442 Atrioventricular block, complete: Secondary | ICD-10-CM | POA: Diagnosis not present

## 2016-08-29 DIAGNOSIS — I482 Chronic atrial fibrillation: Secondary | ICD-10-CM | POA: Insufficient documentation

## 2016-08-29 DIAGNOSIS — I252 Old myocardial infarction: Secondary | ICD-10-CM | POA: Insufficient documentation

## 2016-08-29 DIAGNOSIS — F419 Anxiety disorder, unspecified: Secondary | ICD-10-CM | POA: Insufficient documentation

## 2016-08-29 DIAGNOSIS — I2510 Atherosclerotic heart disease of native coronary artery without angina pectoris: Secondary | ICD-10-CM | POA: Insufficient documentation

## 2016-08-29 DIAGNOSIS — I110 Hypertensive heart disease with heart failure: Secondary | ICD-10-CM | POA: Diagnosis not present

## 2016-08-29 DIAGNOSIS — Z950 Presence of cardiac pacemaker: Secondary | ICD-10-CM

## 2016-08-29 DIAGNOSIS — Z7901 Long term (current) use of anticoagulants: Secondary | ICD-10-CM | POA: Diagnosis not present

## 2016-08-29 DIAGNOSIS — Z87891 Personal history of nicotine dependence: Secondary | ICD-10-CM | POA: Diagnosis not present

## 2016-08-29 HISTORY — PX: PACEMAKER IMPLANT: EP1218

## 2016-08-29 LAB — SURGICAL PCR SCREEN
MRSA, PCR: NEGATIVE
Staphylococcus aureus: NEGATIVE

## 2016-08-29 SURGERY — PACEMAKER IMPLANT

## 2016-08-29 MED ORDER — ACETAMINOPHEN 325 MG PO TABS: 325.0000 mg | ORAL_TABLET | ORAL | Status: DC | PRN

## 2016-08-29 MED ORDER — CEFAZOLIN SODIUM-DEXTROSE 2-4 GM/100ML-% IV SOLN
2.0000 g | INTRAVENOUS | Status: AC
  Administered 2016-08-29: 2 g via INTRAVENOUS

## 2016-08-29 MED ORDER — HEPARIN (PORCINE) IN NACL 2-0.9 UNIT/ML-% IJ SOLN
INTRAMUSCULAR | Status: AC | PRN
  Administered 2016-08-29: 500 mL

## 2016-08-29 MED ORDER — YOU HAVE A PACEMAKER BOOK
Freq: Once | Status: AC
  Administered 2016-08-29: 21:00:00
  Filled 2016-08-29 (×2): qty 1

## 2016-08-29 MED ORDER — SODIUM CHLORIDE 0.9 % IR SOLN
80.0000 mg | Status: AC
  Administered 2016-08-29: 80 mg

## 2016-08-29 MED ORDER — FENTANYL CITRATE (PF) 100 MCG/2ML IJ SOLN
INTRAMUSCULAR | Status: DC | PRN
  Administered 2016-08-29 (×2): 12.5 ug via INTRAVENOUS

## 2016-08-29 MED ORDER — FUROSEMIDE 40 MG PO TABS
40.0000 mg | ORAL_TABLET | Freq: Every day | ORAL | Status: DC
  Administered 2016-08-29 – 2016-08-30 (×2): 40 mg via ORAL
  Filled 2016-08-29 (×2): qty 1

## 2016-08-29 MED ORDER — ONDANSETRON HCL 4 MG/2ML IJ SOLN: 4.0000 mg | Freq: Four times a day (QID) | INTRAMUSCULAR | Status: DC | PRN

## 2016-08-29 MED ORDER — CLONAZEPAM 1 MG PO TABS
1.0000 mg | ORAL_TABLET | Freq: Three times a day (TID) | ORAL | Status: DC
  Administered 2016-08-29 – 2016-08-30 (×4): 1 mg via ORAL
  Filled 2016-08-29 (×4): qty 1

## 2016-08-29 MED ORDER — MUPIROCIN 2 % EX OINT
TOPICAL_OINTMENT | CUTANEOUS | Status: AC
  Administered 2016-08-29: 1 via TOPICAL
  Filled 2016-08-29: qty 22

## 2016-08-29 MED ORDER — POTASSIUM CHLORIDE CRYS ER 10 MEQ PO TBCR
10.0000 meq | EXTENDED_RELEASE_TABLET | Freq: Every day | ORAL | Status: DC
  Administered 2016-08-29 – 2016-08-30 (×2): 10 meq via ORAL
  Filled 2016-08-29 (×4): qty 1

## 2016-08-29 MED ORDER — NITROGLYCERIN 0.4 MG SL SUBL: 0.4000 mg | SUBLINGUAL_TABLET | SUBLINGUAL | Status: DC | PRN

## 2016-08-29 MED ORDER — SODIUM CHLORIDE 0.9 % IR SOLN
Status: AC
Start: 2016-08-29 — End: 2016-08-29
  Filled 2016-08-29: qty 2

## 2016-08-29 MED ORDER — FENTANYL CITRATE (PF) 100 MCG/2ML IJ SOLN
INTRAMUSCULAR | Status: AC
  Filled 2016-08-29: qty 2

## 2016-08-29 MED ORDER — ACETAMINOPHEN 325 MG PO TABS
325.0000 mg | ORAL_TABLET | ORAL | Status: DC | PRN
  Filled 2016-08-29: qty 2

## 2016-08-29 MED ORDER — MUPIROCIN 2 % EX OINT
1.0000 "application " | TOPICAL_OINTMENT | Freq: Once | CUTANEOUS | Status: AC
  Administered 2016-08-29: 1 via TOPICAL
  Filled 2016-08-29: qty 22

## 2016-08-29 MED ORDER — CHLORHEXIDINE GLUCONATE 4 % EX LIQD
60.0000 mL | Freq: Once | CUTANEOUS | Status: DC
  Filled 2016-08-29: qty 60

## 2016-08-29 MED ORDER — HEPARIN (PORCINE) IN NACL 2-0.9 UNIT/ML-% IJ SOLN
INTRAMUSCULAR | Status: AC
  Filled 2016-08-29: qty 500

## 2016-08-29 MED ORDER — SODIUM CHLORIDE 0.9 % IV SOLN
INTRAVENOUS | Status: DC
  Administered 2016-08-29: 06:00:00 via INTRAVENOUS

## 2016-08-29 MED ORDER — CEFAZOLIN SODIUM-DEXTROSE 1-4 GM/50ML-% IV SOLN: 1.0000 g | Freq: Four times a day (QID) | INTRAVENOUS | Status: DC

## 2016-08-29 MED ORDER — LIDOCAINE HCL (PF) 1 % IJ SOLN
INTRAMUSCULAR | Status: DC | PRN
  Administered 2016-08-29: 40 mL via INTRADERMAL

## 2016-08-29 MED ORDER — ATORVASTATIN CALCIUM 20 MG PO TABS
20.0000 mg | ORAL_TABLET | Freq: Every evening | ORAL | Status: DC
  Administered 2016-08-29: 20 mg via ORAL
  Filled 2016-08-29: qty 1

## 2016-08-29 MED ORDER — LIDOCAINE HCL (PF) 1 % IJ SOLN
INTRAMUSCULAR | Status: AC
  Filled 2016-08-29: qty 60

## 2016-08-29 MED ORDER — MIDAZOLAM HCL 5 MG/5ML IJ SOLN
INTRAMUSCULAR | Status: DC | PRN
  Administered 2016-08-29 (×2): 1 mg via INTRAVENOUS

## 2016-08-29 MED ORDER — MIDAZOLAM HCL 5 MG/5ML IJ SOLN
INTRAMUSCULAR | Status: AC
  Filled 2016-08-29: qty 5

## 2016-08-29 MED ORDER — CEFAZOLIN SODIUM-DEXTROSE 2-4 GM/100ML-% IV SOLN
INTRAVENOUS | Status: AC
  Filled 2016-08-29: qty 100

## 2016-08-29 MED ORDER — CEFAZOLIN SODIUM-DEXTROSE 1-4 GM/50ML-% IV SOLN
1.0000 g | Freq: Four times a day (QID) | INTRAVENOUS | Status: AC
  Administered 2016-08-29 – 2016-08-30 (×3): 1 g via INTRAVENOUS
  Filled 2016-08-29 (×3): qty 50

## 2016-08-29 SURGICAL SUPPLY — 13 items
CABLE SURGICAL S-101-97-12 (CABLE) ×2 IMPLANT
CATH RIGHTSITE C315HIS02 (CATHETERS) ×2 IMPLANT
IPG PACE AZUR XT DR MRI W1DR01 (Pacemaker) IMPLANT
LEAD CAPSURE NOVUS 5076-58CM (Lead) ×2 IMPLANT
LEAD SELECT SECURE 3830 383069 (Lead) IMPLANT
PACE AZURE XT DR MRI W1DR01 (Pacemaker) ×3 IMPLANT
PAD DEFIB LIFELINK (PAD) ×3 IMPLANT
SELECT SECURE 3830 383069 (Lead) ×3 IMPLANT
SHEATH CLASSIC 7F (SHEATH) ×4 IMPLANT
SLITTER 6230UNI (MISCELLANEOUS) ×2 IMPLANT
SLITTER 6232ADJ (MISCELLANEOUS) ×2 IMPLANT
TRAY PACEMAKER INSERTION (PACKS) ×2 IMPLANT
WIRE HI TORQ VERSACORE-J 145CM (WIRE) ×2 IMPLANT

## 2016-08-29 NOTE — Discharge Summary (Signed)
ELECTROPHYSIOLOGY PROCEDURE DISCHARGE SUMMARY    Patient ID: JARL SELLITTO,  MRN: [id number], DOB/AGE: 11/28/1945 71 y.o.  Admit date: 08/29/2016 Discharge date: 08/30/16  Primary Care Physician: Lajean Manes, MD  Primary Cardiologist: Dr. Acie Fredrickson Electrophysiologist: Dr. Lovena Le  Primary Discharge Diagnosis:  1. CHB 2. HTN  Secondary Discharge Diagnosis:  1. Permanent Afib     CHA2DS2Vasc is at least 4, on Eliquis 2. CAD 3. HTN  Allergies  Allergen Reactions  . Codeine Other (See Comments)    Dizziness, stomach pain     Procedures This Admission:  1.  Implantation of a MDT dual chamber PPM on 08/29/16 by Dr Lovena Le.  The patient received a medtronic (serial number [id number]) pacemaker, Medtronic [id number] (serial number [id number]) right atrial/His bundle lead and a Medtronic [id number] (serial number [id number]) right ventricular lead were advanced with fluoroscopic visualization into the right atrial/his bundle and right ventricular septal positions respectively There were no immediate post procedure complications. 2.  CXR on demonstrated no pneumothorax status post device implantation. (patient follows with oncology for other mentioned finding and f/u recs)  Brief HPI: Benjamin [name] is a 71 y.o. male was referred to electrophysiology in the outpatient setting for consideration of PPM implantation.  Past medical history includes CAD, permanent AFib, HTN, CHB.  The patient has had symptomatic bradycardia without reversible causes identified.  Risks, benefits, and alternatives to PPM implantation were reviewed with the patient who wished to proceed.   Hospital Course:  The patient was admitted and underwent implantation of a PPM with details as outlined above. He  was monitored on telemetry overnight which demonstrated AFib, V paced.  Left chest was without hematoma or ecchymosis.  The device was interrogated and found to be functioning normally.  CXR was obtained and demonstrated  no pneumothorax status post device implantation.  Wound care, arm mobility, and restrictions were reviewed with the patient.  The patient was examined by Dr. Lovena Le and considered stable for discharge to home. We will start Toprol now s/p PPM for his BP.  Patient reports home BP tend to be 140-150/80's. Will have his BP checked at his wound check visit.  Will resume his Eliquis tomorrow.   Physical Exam: Vitals:   08/29/16 1130 08/29/16 1200 08/29/16 2012 08/30/16 0507  BP: (!) 165/84 (!) 149/98 (!) 160/92 (!) 150/96  Pulse: 60 60 61 60  Resp:   18 18  Temp:   97.7 F (36.5 C) 98.3 F (36.8 C)  TempSrc:   Oral Oral  SpO2: 99% 100% 96% 97%  Weight:      Height:        GEN- The patient is well appearing, alert and oriented x 3 today.   HEENT: normocephalic, atraumatic; sclera clear, conjunctiva pink; hearing intact; oropharynx clear; neck supple, no JVP Lungs- CTA b/l, normal work of breathing.  No wheezes, rales, rhonchi Heart- RRR (paced), no murmurs, rubs or gallops, PMI not laterally displaced GI- soft, non-tender, non-distended Extremities- no clubbing, cyanosis, no or edema MS- no significant deformity or atrophy Skin- warm and dry, no rash or lesion, left chest without hematoma/ecchymosis Psych- euthymic mood, full affect Neuro- no gross deficits   Labs:   Lab Results  Component Value Date   WBC 9.6 08/23/2016   HGB 15.4 08/23/2016   HCT 46.6 08/23/2016   MCV 88 08/23/2016   PLT 141 (L) 08/23/2016     Recent Labs Lab 08/23/16 1054  NA 141  K 4.2  CL 101  CO2 25  BUN 15  CREATININE 0.89  CALCIUM 8.8  GLUCOSE 100*    Discharge Medications:  Allergies as of 08/30/2016      Reactions   Codeine Other (See Comments)   Dizziness, stomach pain      Medication List    TAKE these medications   apixaban 5 MG Tabs tablet Commonly known as:  ELIQUIS Take 1 tablet (5 mg total) by mouth 2 (two) times daily.   atorvastatin 20 MG tablet Commonly known as:   LIPITOR Take 20 mg by mouth every evening.   clonazePAM 1 MG tablet Commonly known as:  KLONOPIN Take 1 mg by mouth 3 (three) times daily. 8am, 2pm, 8 pm   furosemide 20 MG tablet Commonly known as:  LASIX Take 2 tablets (40 mg total) by mouth daily.   metoprolol succinate 25 MG 24 hr tablet Commonly known as:  TOPROL XL Take 1 tablet (25 mg total) by mouth daily.   nitroGLYCERIN 0.4 MG SL tablet Commonly known as:  NITROSTAT Place 1 tablet (0.4 mg total) under the tongue every 5 (five) minutes x 3 doses as needed for chest pain.   potassium chloride 10 MEQ tablet Commonly known as:  K-DUR Take 1 tablet (10 mEq total) by mouth daily.       Disposition: Home Discharge Instructions    Diet - low sodium heart healthy    Complete by:  As directed    Increase activity slowly    Complete by:  As directed      Follow-up Information    [facility] Follow up on 09/11/2016.   Specialty:  Cardiology Why:  10:30AM, wound check,  BP check Contact information: [address], [address] [place]       Evans Lance, MD Follow up on 12/05/2016.   Specialty:  Cardiology Why:  9:45AM Contact information: [address] [place] [address] [phone]           Duration of Discharge Encounter: Greater than 30 minutes including physician time.  Venetia Night, PA-C 08/30/2016 9:47 AM  EP Attending Patient seen and examined. Agree with above. The patient is stable after insertion of a DDD PM with his bundle pacing. [place] for DC with usual followup.  Mikle Bosworth.D.

## 2016-08-29 NOTE — Interval H&P Note (Signed)
History and Physical Interval Note:  08/29/2016 7:16 AM  [name]  has presented today for surgery, with the diagnosis of bradicardia  The various methods of treatment have been discussed with the patient and family. After consideration of risks, benefits and other options for treatment, the patient has consented to  Procedure(s): Pacemaker Implant (N/A) as a surgical intervention .  The patient's history has been reviewed, patient examined, no change in status, stable for surgery.  I have reviewed the patient's chart and labs.  Questions were answered to the patient's satisfaction.     Benjamin [name]

## 2016-08-29 NOTE — Progress Notes (Signed)
Pt received from cath lab. Pt oriented to room and equipment. Telemetry applied, CCMD notified x2. Pacing spike evident on telemetry. BP elevated at 161/99 - pt asymptomatic - will give scheduled medications and monitor.   Fritz Pickerel, RN

## 2016-08-29 NOTE — H&P (View-Only) (Signed)
HPI Benjamin [name] is referred today by Dr. 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 He is a pleasant 71 yo man with multiple medical problems. He has had recurrent lung CA, s/p surgery and radiation treatment. He is clinically in remission. He has a h/o CAD, s/p MI with no angina. He has had longstanding atrial fibrillation and bradycardia which appears to have worsened. He has never had syncope but does get dizzy with exertion. He has class 2B to 3 CHF symptoms with a h/o normal LV function by echo. He has peripheral edema. Allergies  Allergen Reactions  . Codeine Other (See Comments)    Dizziness, stomach pain     Current Outpatient Prescriptions  Medication Sig Dispense Refill  . apixaban (ELIQUIS) 5 MG TABS tablet Take 1 tablet (5 mg total) by mouth 2 (two) times daily. 60 tablet 11  . atorvastatin (LIPITOR) 20 MG tablet Take 20 mg by mouth daily at 6 PM.     . clonazePAM (KLONOPIN) 1 MG tablet Take 1 mg by mouth 3 (three) times daily.     . furosemide (LASIX) 20 MG tablet Take 2 tablets (40 mg total) by mouth daily. 180 tablet 3  . nitroGLYCERIN (NITROSTAT) 0.4 MG SL tablet Place 1 tablet (0.4 mg total) under the tongue every 5 (five) minutes x 3 doses as needed for chest pain. 25 tablet 3  . potassium chloride (K-DUR) 10 MEQ tablet Take 1 tablet (10 mEq total) by mouth daily. 90 tablet 3   No current facility-administered medications for this visit.      Past Medical History:  Diagnosis Date  . Adenopathy    RIGHT ADRENAL  . Anxiety   . Arthritis    KNEES/HANDS  . CAD (coronary artery disease)    a. Prior known CTO of LCX with recanalizationby cath 2008;  b. 09/2012 NSTEMI/Cath/PCI: LM nl, LAD 15m, D1 50p, LCX 100p (2.75x16 Promus Premier DES), 80-107m, OM1 30, RCA 52m (3.0x16 Promus Premier DES), EF 50-55%.  [name] History of echocardiogram    Echo 2/18: Moderate LVH, EF 55-60, normal wall motion, trivial AI, moderate LAE, mild RVE,  mild RAE, mildly elevated pulmonary pressure  . Hx of melanoma of skin   . Non-small cell lung cancer ([place])    a. s/p resection and XRT.  [name] PAF (paroxysmal atrial fibrillation) ([place])    a. not currently on anticoagulation (09/2012)    ROS:   All systems reviewed and negative except as noted in the HPI.   Past Surgical History:  Procedure Laterality Date  . ADRENALEXTOMY  02/2000  . CARDIAC CATHETERIZATION  2008   LAD 40, D1 OK, D2 50, CFX 90, RCA OK, EF 60%; Consider PCI PRN but the stenosis is quite complex and we would end up jailing several large posterolateral branches    . LEFT HEART CATHETERIZATION WITH CORONARY ANGIOGRAM N/A 10/17/2012   Procedure: LEFT HEART CATHETERIZATION WITH CORONARY ANGIOGRAM;  Surgeon: Sherren Mocha, MD;  Location: [facility] CATH LAB;  Service: Cardiovascular;  Laterality: N/A;  . LOBECTOMY LEFT UPPER LOBE  08/15/2001  . Right VATS, mini thoracotomy, wedge resection of right upper  01/18/2004     Family History  Problem Relation Age of Onset  . Heart attack Father   . Cancer Mother        lung  . Cancer Cousin        breast     Social History   Social  History  . Marital status: Divorced    Spouse name: N/A  . Number of children: N/A  . Years of education: N/A   Occupational History  . 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 Smoking status: Former Smoker    Packs/day: 1.00    Years: 40.00    Types: Cigarettes    Quit date: 08/15/2001  . Smokeless tobacco: Never Used  . Alcohol use No  . Drug use: No  . Sexual activity: Not Currently   Other Topics Concern  . Not on file   Social History Narrative  . No narrative on file     BP (!) 144/80   Pulse (!) 34   Ht 5\' 11"  (1.803 m)   Wt 209 lb (94.8 kg)   SpO2 97%   BMI 29.15 kg/m   Physical Exam:  Well appearing 71 yo man, NAD HEENT: Unremarkable Neck:  6 cm JVD, no thyromegally Lymphatics:  No adenopathy Back:  No CVA tenderness Lungs:  Clear with no  wheezes HEART:  Regular brady rhythm, no murmurs, no rubs, no clicks Abd:  soft, positive bowel sounds, no organomegally, no rebound, no guarding Ext:  2 plus pulses, 2+ edema, no cyanosis, no clubbing Skin:  No rashes no nodules Neuro:  CN II through XII intact, motor grossly intact  EKG - atrial fib with CHB at 33/min  Assess/Plan: 1. Atrial fib with a slow VR - He is slow and symptomatic.  2. Coags - he will continue his Eliquis but hold it for his PPM 3. CHB - he has complete heart block with underlying atrial fib. I have recommended insertion of a His bundle PPM.  4. HTN - his blood pressure is elevated a bit. I would anticipate uptitration of his BP meds including adding a beta blocker or calcium channel blocker after his PPM is inserted.  Mikle Bosworth.D.

## 2016-08-30 ENCOUNTER — Observation Stay (HOSPITAL_COMMUNITY): Payer: Medicare Other

## 2016-08-30 ENCOUNTER — Encounter (HOSPITAL_COMMUNITY): Payer: Self-pay | Admitting: Internal Medicine

## 2016-08-30 DIAGNOSIS — I2510 Atherosclerotic heart disease of native coronary artery without angina pectoris: Secondary | ICD-10-CM | POA: Diagnosis not present

## 2016-08-30 DIAGNOSIS — I442 Atrioventricular block, complete: Secondary | ICD-10-CM

## 2016-08-30 DIAGNOSIS — I110 Hypertensive heart disease with heart failure: Secondary | ICD-10-CM | POA: Diagnosis not present

## 2016-08-30 DIAGNOSIS — I482 Chronic atrial fibrillation: Secondary | ICD-10-CM | POA: Diagnosis not present

## 2016-08-30 DIAGNOSIS — I509 Heart failure, unspecified: Secondary | ICD-10-CM | POA: Diagnosis not present

## 2016-08-30 DIAGNOSIS — F419 Anxiety disorder, unspecified: Secondary | ICD-10-CM | POA: Diagnosis not present

## 2016-08-30 DIAGNOSIS — Z7901 Long term (current) use of anticoagulants: Secondary | ICD-10-CM | POA: Diagnosis not present

## 2016-08-30 DIAGNOSIS — Z923 Personal history of irradiation: Secondary | ICD-10-CM | POA: Diagnosis not present

## 2016-08-30 DIAGNOSIS — Z87891 Personal history of nicotine dependence: Secondary | ICD-10-CM | POA: Diagnosis not present

## 2016-08-30 DIAGNOSIS — Z452 Encounter for adjustment and management of vascular access device: Secondary | ICD-10-CM | POA: Diagnosis not present

## 2016-08-30 DIAGNOSIS — I252 Old myocardial infarction: Secondary | ICD-10-CM | POA: Diagnosis not present

## 2016-08-30 MED ORDER — METOPROLOL SUCCINATE ER 25 MG PO TB24: 25.0000 mg | ORAL_TABLET | Freq: Every day | ORAL | 6 refills | Status: DC

## 2016-08-30 NOTE — Discharge Instructions (Signed)

## 2016-08-30 NOTE — Progress Notes (Signed)
[name] to be D/C'd Home per MD order. Discussed with the patient and all questions fully answered.    VVS, Skin clean, dry and intact without evidence of skin break down, no evidence of skin tears noted.  IV catheter discontinued intact. Site without signs and symptoms of complications. Dressing and pressure applied.  An After Visit Summary was printed and given to the patient.  Patient escorted via [place], and D/C home via private auto.  Cyndra Numbers  08/30/2016 1:17 PM

## 2016-09-01 DIAGNOSIS — M109 Gout, unspecified: Secondary | ICD-10-CM | POA: Diagnosis not present

## 2016-09-05 ENCOUNTER — Telehealth: Payer: Self-pay

## 2016-09-05 NOTE — Telephone Encounter (Signed)
Called pt with lab results. Pt had questions r/t recent PPM implant on 08/29/16. Pt stated he cannot get his home monitor to work. Informed pt I read note on upcoming appt (wound check) that addressed this issue. Informed if unable to do remote transmission, the alternative is for pt to come in to [facility] for device check. Pt stated he would like someone from [facility] to call. He has questions r/t device and settings. Pt noted he had an brief episode where HR was irregular. Pt stated he was slightly SOB during episode. Informed if it symptoms return, please call our office.  I will forward to [facility] to advise.

## 2016-09-05 NOTE — Telephone Encounter (Signed)
Had an extensive conversation with patient regarding his home monitor. He stays in [place] with his girlfriend most of the time and does not have any signal - he has attempted tech services to no avail. I encouraged him to call me when he was back at his house in [place] for [place] to trouble shoot his home monitor at that point. He verbalized understanding. I confirmed his appointment for 6/25.

## 2016-09-11 ENCOUNTER — Encounter: Payer: Self-pay | Admitting: Internal Medicine

## 2016-09-11 ENCOUNTER — Telehealth: Payer: Self-pay | Admitting: *Deleted

## 2016-09-11 ENCOUNTER — Telehealth: Payer: Self-pay | Admitting: Cardiovascular Disease

## 2016-09-11 ENCOUNTER — Ambulatory Visit (INDEPENDENT_AMBULATORY_CARE_PROVIDER_SITE_OTHER): Payer: Medicare Other | Admitting: *Deleted

## 2016-09-11 DIAGNOSIS — I442 Atrioventricular block, complete: Secondary | ICD-10-CM | POA: Diagnosis not present

## 2016-09-11 LAB — CUP PACEART INCLINIC DEVICE CHECK
Battery Remaining Longevity: 83 mo
Battery Voltage: 3.18 V
Brady Statistic AP VS Percent: 96.04 %
Brady Statistic RA Percent Paced: 96.47 %
Brady Statistic RV Percent Paced: 0.11 %
Date Time Interrogation Session: 20180625145559
Implantable Lead Implant Date: 20180612
Implantable Lead Implant Date: 20180612
Implantable Lead Location: 753860
Implantable Lead Location: 753860
Implantable Lead Model: 3830
Implantable Pulse Generator Implant Date: 20180612
Lead Channel Impedance Value: 323 Ohm
Lead Channel Impedance Value: 589 Ohm
Lead Channel Pacing Threshold Amplitude: 0.5 V
Lead Channel Pacing Threshold Pulse Width: 1 ms
Lead Channel Sensing Intrinsic Amplitude: 13.75 mV
Lead Channel Sensing Intrinsic Amplitude: 5.5 mV
Lead Channel Sensing Intrinsic Amplitude: 5.5 mV
Lead Channel Setting Pacing Amplitude: 3.5 V
Lead Channel Setting Pacing Amplitude: 3.5 V
Lead Channel Setting Sensing Sensitivity: 0.9 mV
MDC IDC MSMT LEADCHNL RA IMPEDANCE VALUE: 494 Ohm
MDC IDC MSMT LEADCHNL RV IMPEDANCE VALUE: 494 Ohm
MDC IDC MSMT LEADCHNL RV PACING THRESHOLD AMPLITUDE: 0.5 V
MDC IDC MSMT LEADCHNL RV PACING THRESHOLD PULSEWIDTH: 0.4 ms
MDC IDC MSMT LEADCHNL RV SENSING INTR AMPL: 16 mV
MDC IDC SET LEADCHNL RV PACING PULSEWIDTH: 0.4 ms
MDC IDC STAT BRADY AP VP PERCENT: 0.11 %
MDC IDC STAT BRADY AS VP PERCENT: 0 %
MDC IDC STAT BRADY AS VS PERCENT: 3.86 %

## 2016-09-11 NOTE — Telephone Encounter (Signed)
Patient was in the office for a wound check and requested samples of eliquis. One week supply of samples provided.

## 2016-09-11 NOTE — Progress Notes (Signed)
Wound check appointment. Steri-strips removed. Wound without redness or edema. Incision edges mostly approximated, wound healing well. Stitch clipped from R corner of incision, dressed with bacitracin ointment and steri strips. Patient instructed to remove strips on Friday and f/u on 7/2 @ 1330 for a wound recheck.   BP obtained this ov--151/90, R arm, sitting, patient asymptomatic. BP result discussed with Dr.Camnitz, recommendation was for patient to resume Toprol XL 25mg . Patient stated that he had previously d/c'd medication after a couple of doses d/t dizziness. Patient was encouraged to take medication QHS and to at least try medication for 2 weeks to see if sx's subside. Patient was strongly encouraged to call prior to stopping medication.   Normal device function. Thresholds, sensing, and impedances consistent with implant measurements. Device programmed at 3.5V for extra safety margin until 3 month visit. Rhythm strip obtained while testing HBP lead, nonselective capture noted >/= 1.25V, septal pacing noted below 1.25V to LOC. Histogram distribution appropriate for patient and level of activity. No episodes recorded. Patient educated about wound care, arm mobility, lifting restrictions. ROV w/ GT on 12/05/16 @ 0945.

## 2016-09-11 NOTE — Telephone Encounter (Signed)
Walk in pt form-questions about meds-placed in [employer]

## 2016-09-18 ENCOUNTER — Ambulatory Visit (INDEPENDENT_AMBULATORY_CARE_PROVIDER_SITE_OTHER): Payer: Medicare Other | Admitting: *Deleted

## 2016-09-18 DIAGNOSIS — I442 Atrioventricular block, complete: Secondary | ICD-10-CM

## 2016-09-18 NOTE — Progress Notes (Signed)
Wound re-check. Steri strips removed by patient. Incision edges approximated and free of redness or swelling. Patient instructed to call for any s/s of infection.

## 2016-09-25 DIAGNOSIS — F411 Generalized anxiety disorder: Secondary | ICD-10-CM | POA: Diagnosis not present

## 2016-10-05 ENCOUNTER — Telehealth: Payer: Self-pay | Admitting: Cardiovascular Disease

## 2016-10-05 NOTE — Telephone Encounter (Signed)
New Message   Pt call to speak with someone in refills. Pt did not want to disclose any further information. Please call back to discuss

## 2016-10-05 NOTE — Telephone Encounter (Signed)
Patient calling to receive samples for Eliquis. Informed patient that this is his 2nd pick up of samples in the 2 months and he is only allowed 3 in a 6 month time frame. He said he was grateful. I will leave a 2 week supply for him at front desk. He is agreeable to coming to pick them up

## 2016-10-13 IMAGING — CT CT CHEST W/ CM
2 of 3 series · 14 of 36 positions shown, 17 images · IV contrast (iopamidol)
Comparison: Chest CT 07/13/2015.  PET-CT 08/23/2015.

CLINICAL DATA: 70-year-old male with history of lung cancer.
Restaging examination.

EXAM:
CT CHEST WITH CONTRAST
TECHNIQUE: Multidetector CT imaging of the chest was performed during
intravenous contrast administration.
CONTRAST:  75mL BEMFR4-G77 IOPAMIDOL (BEMFR4-G77) INJECTION 61%

[Series 2: chest with st · axial · 0.82mm/px · z∈[+1448,+1724]mm · 11 of 163 slices shown, 14 images]
[im 13/163  mediastinal]
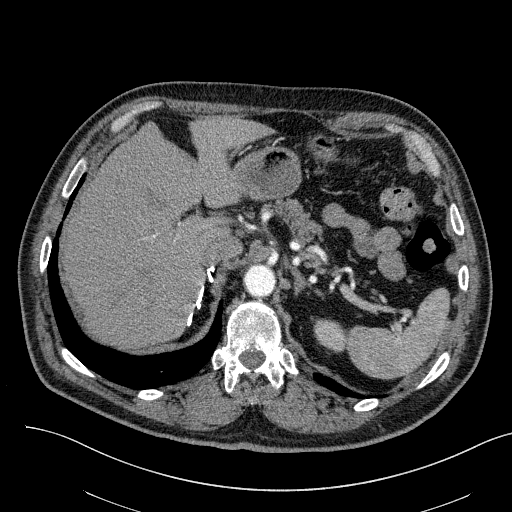
[im 13/163  lung]
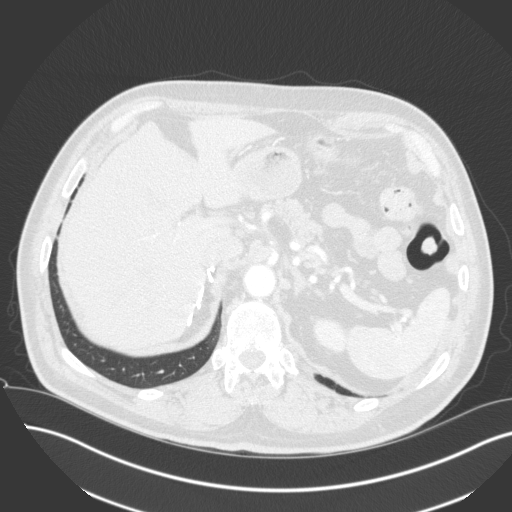
[im 25/163  lung]
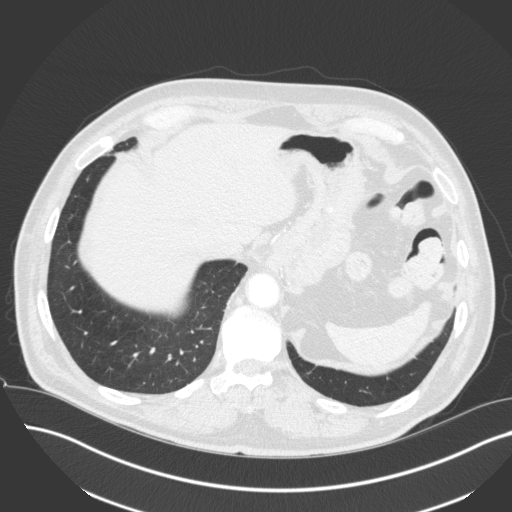
[im 37/163  lung]
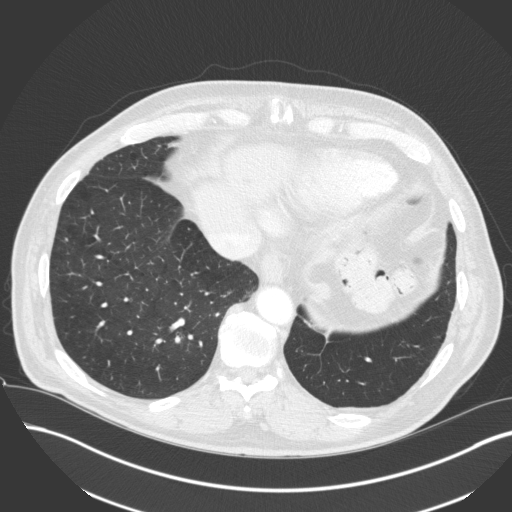
[im 55/163  lung]
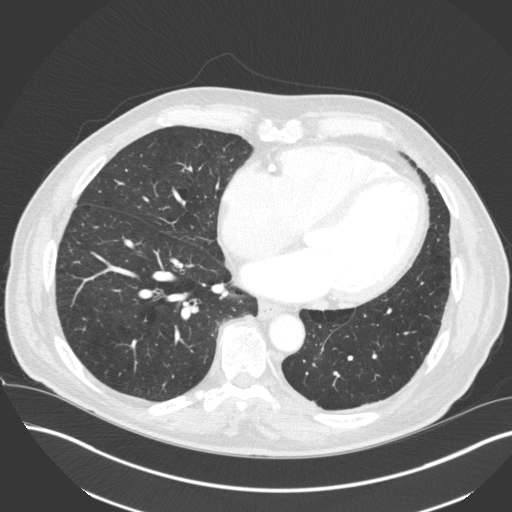
[im 67/163  mediastinal]
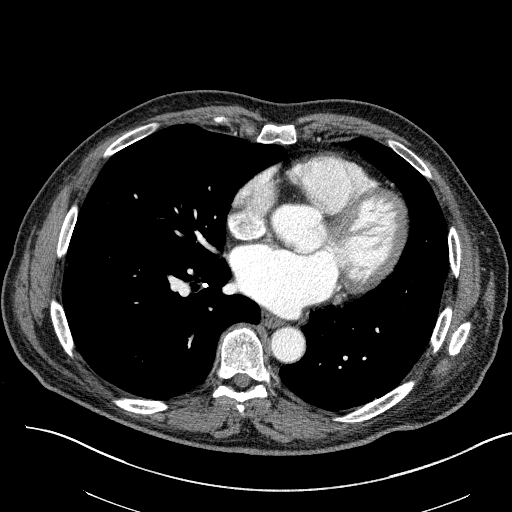
[im 67/163  lung]
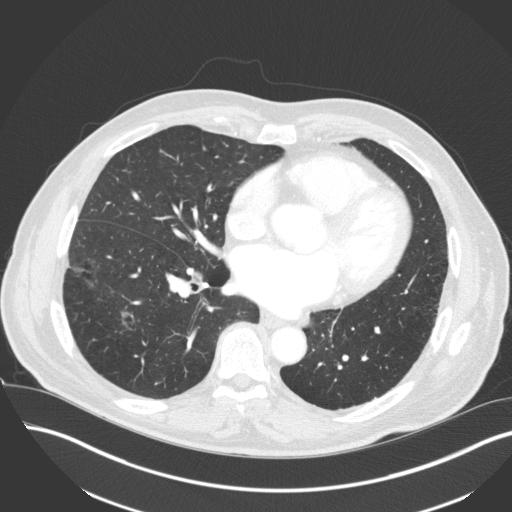
[im 85/163  lung]
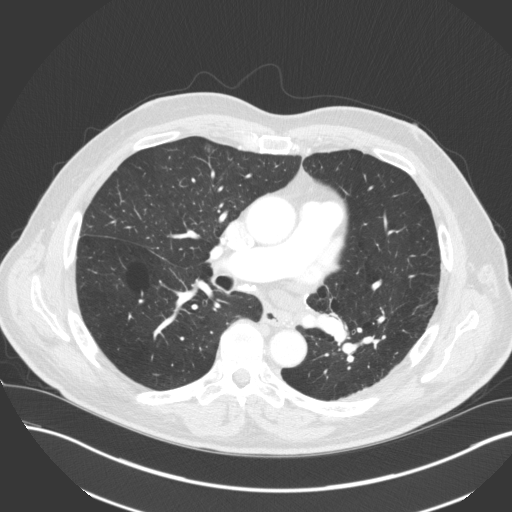
[im 97/163  lung]
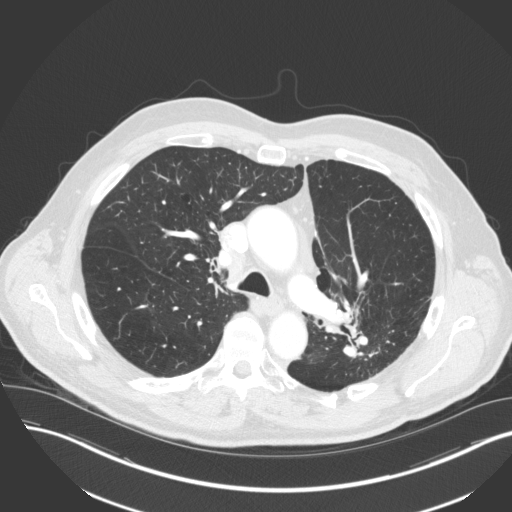
[im 109/163  lung]
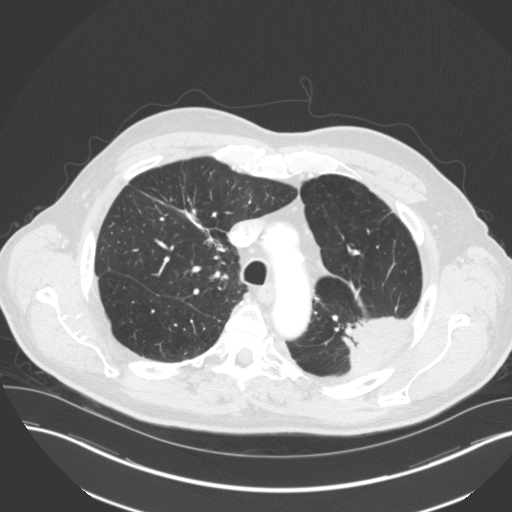
[im 127/163  mediastinal]
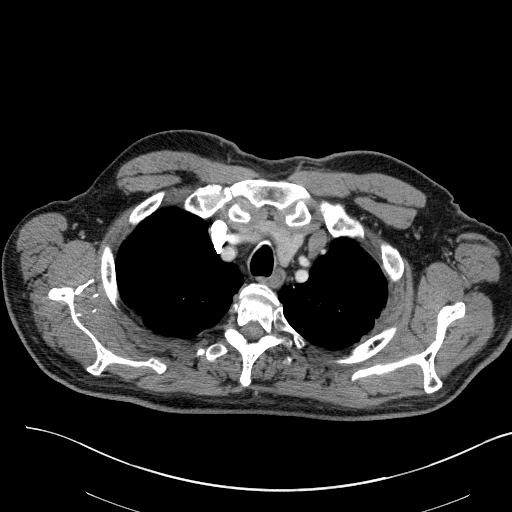
[im 127/163  lung]
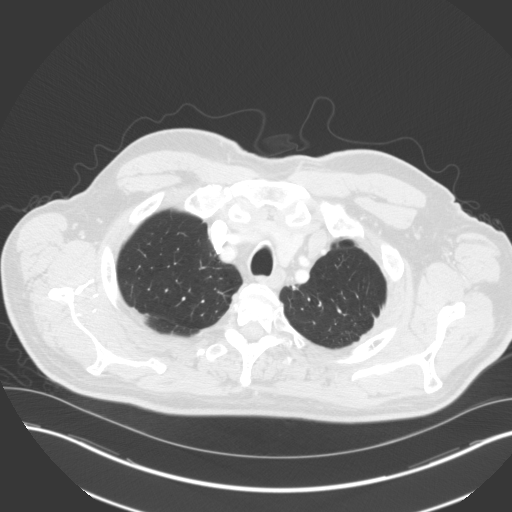
[im 139/163  lung]
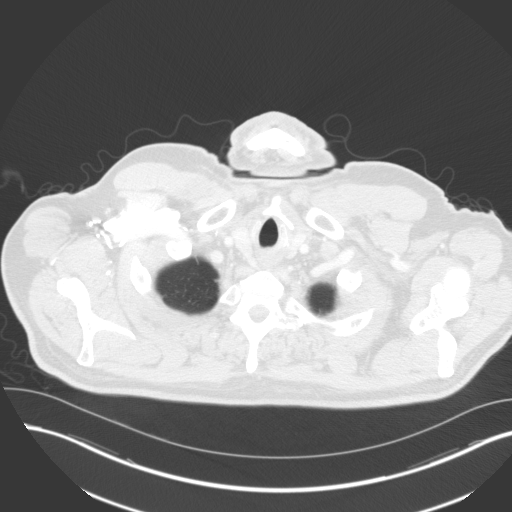
[im 151/163  lung]
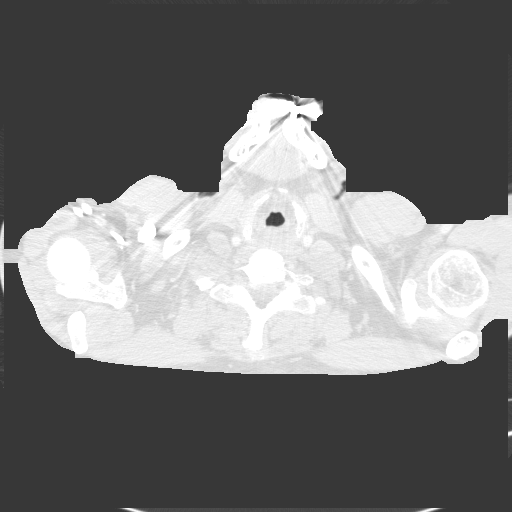

[Series 4: coronals · coronal · 0.68mm/px · 3 of 104 slices shown]
[im 21/104  lung]
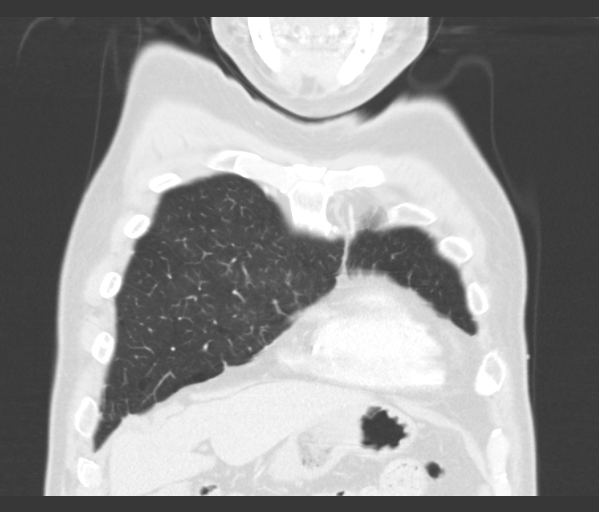
[im 42/104  lung]
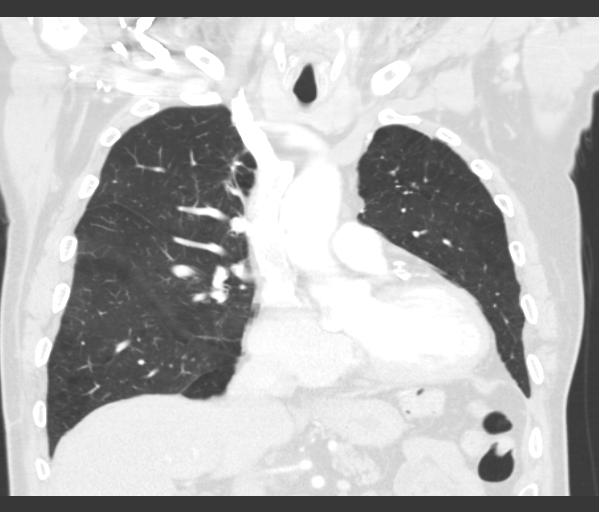
[im 62/104  lung]
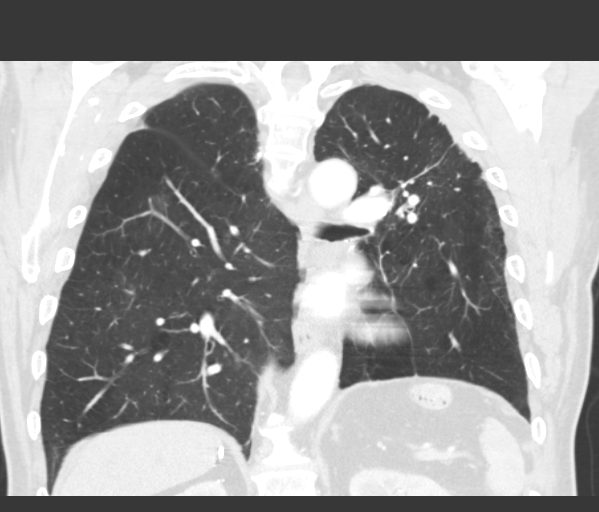

[14 of 36 positions shown; findings below may reference images not displayed]

FINDINGS: Cardiovascular: Heart size is borderline enlarged. There is no
significant pericardial fluid, thickening or pericardial
calcification. There is aortic atherosclerosis, as well as
atherosclerosis of the great vessels of the mediastinum and the
coronary arteries, including calcified atherosclerotic plaque in the
left anterior descending, left circumflex and right coronary
arteries.

Mediastinum/Nodes: No pathologically enlarged mediastinal or hilar
lymph nodes. Several tiny calcified mediastinal lymph nodes are
incidentally noted. Esophagus is unremarkable in appearance. No
axillary lymphadenopathy.

Lungs/Pleura: Postoperative changes of left upper lobectomy are
noted. Compensatory hyperexpansion of the left lower lobe. In the
periphery of the left lower lobe, predominantly involving the
superior segment, there is again a chronic pleural-based mass-like
opacity which measures approximately 6.2 x 3.5 x 5.0 cm, which has
been stable over numerous prior examinations, most compatible with
chronic postradiation mass-like fibrosis. Previously noted sub solid
right lower lobe pulmonary nodule appears slightly smaller than
prior examinations measuring 2.6 x 2.8 cm on today's study (image 98
of series 3). Thin-walled cyst in the right lower lobe (image 87 of
series 3). An adjacent smaller thin-walled cysts is also noted
(image 95 of series 3), with some surrounding ground-glass
attenuation on today's examination, which appears new compared to
prior study (nonspecific). Tiny calcified granuloma in the dependent
portion of the right lower lobe. No acute consolidative airspace
disease. No pleural effusions. Probable suture line in the right
upper lobe from prior wedge resection.

Upper Abdomen: Surgical clips in the upper right retroperitoneum
apparently from prior right-sided adrenalectomy.

Musculoskeletal: Postthoracotomy changes in the left ribs. There are
no aggressive appearing lytic or blastic lesions noted in the
visualized portions of the skeleton.
IMPRESSION: 1. Slight decreased size of predominantly ground-glass attenuation
nodule in the right lower lobe compared to the prior examination.
2. Stable area of postradiation mass-like fibrosis in the periphery
of the left lower lobe.
3. No mediastinal or hilar lymphadenopathy noted on today's
examination.
4. Aortic atherosclerosis, in addition to 3 vessel coronary artery
disease. Assessment for potential risk factor modification, dietary
therapy or pharmacologic therapy may be warranted, if clinically
indicated.
5. Additional incidental findings, as above.

## 2016-11-01 NOTE — Progress Notes (Signed)
Benjamin [name]. Ching 71 y.o. Man with Multiple Stage I, NSCLC of the left upper lobe, right upper lobe, left lower lobe, and now of the right lower lobe completed radiation 09-27-15, review CT chest 08- 22 -18, 3 month FU.    Weight changes, if any: None Respiratory complaints, if any: Some sob ,oxygen level stays good per patient. Hemoptysis, if any: None Swallowing Problems/Pain/Difficulty swallowing: None Appetite: Good Pain: None  When is next chemo scheduled?:None Lab: 11-03-16 Bun 11 creatine 1.00 Imaging:11-08-16 CT chest w contrast

## 2016-11-03 ENCOUNTER — Ambulatory Visit (INDEPENDENT_AMBULATORY_CARE_PROVIDER_SITE_OTHER): Payer: Medicare Other | Admitting: Cardiovascular Disease

## 2016-11-03 ENCOUNTER — Encounter: Payer: Self-pay | Admitting: Cardiovascular Disease

## 2016-11-03 VITALS — BP 154/90 | HR 83 | Ht 71.0 in | Wt 209.2 lb

## 2016-11-03 DIAGNOSIS — I481 Persistent atrial fibrillation: Secondary | ICD-10-CM | POA: Diagnosis not present

## 2016-11-03 DIAGNOSIS — E782 Mixed hyperlipidemia: Secondary | ICD-10-CM

## 2016-11-03 DIAGNOSIS — I4819 Other persistent atrial fibrillation: Secondary | ICD-10-CM

## 2016-11-03 DIAGNOSIS — I2510 Atherosclerotic heart disease of native coronary artery without angina pectoris: Secondary | ICD-10-CM | POA: Diagnosis not present

## 2016-11-03 LAB — BASIC METABOLIC PANEL
BUN / CREAT RATIO: 11 (ref 10–24)
BUN: 11 mg/dL (ref 8–27)
CHLORIDE: 101 mmol/L (ref 96–106)
CO2: 26 mmol/L (ref 20–29)
Calcium: 9 mg/dL (ref 8.6–10.2)
Creatinine, Ser: 1 mg/dL (ref 0.76–1.27)
GFR calc Af Amer: 86 mL/min/{1.73_m2} (ref >59)
GFR calc non Af Amer: 74 mL/min/{1.73_m2} (ref >59)
GLUCOSE: 83 mg/dL (ref 65–99)
Potassium: 4.1 mmol/L (ref 3.5–5.2)
Sodium: 142 mmol/L (ref 134–144)

## 2016-11-03 LAB — HEPATIC FUNCTION PANEL
ALT: 18 IU/L (ref 0–44)
AST: 20 IU/L (ref 0–40)
Albumin: 4.2 g/dL (ref 3.5–4.8)
Alkaline Phosphatase: 77 IU/L (ref 39–117)
BILIRUBIN, DIRECT: 0.18 mg/dL (ref 0.00–0.40)
Bilirubin Total: 0.6 mg/dL (ref 0.0–1.2)
Total Protein: 6.3 g/dL (ref 6.0–8.5)

## 2016-11-03 LAB — LIPID PANEL
CHOL/HDL RATIO: 2.4 ratio (ref 0.0–5.0)
CHOLESTEROL TOTAL: 120 mg/dL (ref 100–199)
HDL: 51 mg/dL (ref >39)
LDL Calculated: 41 mg/dL (ref 0–99)
TRIGLYCERIDES: 142 mg/dL (ref 0–149)
VLDL Cholesterol Cal: 28 mg/dL (ref 5–40)

## 2016-11-03 NOTE — Progress Notes (Signed)
Benjamin [name] Date of Birth: 08/08/1945 Medical Record #[id number]   Problem List: 1. CAD - DES to RCA and prox LCx.  (10/16/12) 2. Paroxysmal Atrial fib: 3. Non-small cell lung ca. 4. Leg edema 5.  Pacer - complete heart block    Has CAD with known disease and prior LCX occlusion per cath in 2001 - follow up cath in 2008 showed recanalization of the lCX and otherwise nonobstructive disease, PAF, anxiety, mobitz type 1 second degree AV block - not on beta blocker therapy. Has also had lung cancer in the past treated with surgery and XRT.   Has had recent NSTEMI and had PCI/DES of the RCA and LCX. EF is 50 to 55% per cath. Was noted to have PAF during this admission - not currently on anticoagulation in the setting of dual antiplatelet therapy but this need to be addressed in the future. His CHADSVASc = 2.   Comes in today. Here with his wife. He is doing ok. Has been home about 5 weeks. Had a little chest discomfort initially - none over the past 3 weeks - no NTG use. Feels ok. Mostly limited by his breathing due to his lung cancer history/treatment. No bleeding or bruising. Not fasting today.   Nov. 3, 2014:  Jul 30, 2013:  Benjamin [name] is doing ok.  Has had some anxiety . Rare CP. Typically at rest.  Mild arm discomfort with walking .  He has had lots of lung surgeries ( non-small cell CA) and is limited by dyspnea.    August 20, 2013:  Benjamin [name] is doing well.  His left arm pain has improved.  His myoview was intermediate risk. - he has a scar in the Lcx distribution with possible periinfarct ischimia.  He had significant uptake in the bowel which made interpretation of the myoview difficult   He is chronically short of breath from his multiple lung surgeries. He really is not having any symptoms of angina.  Dec. 15, 2015:  Benjamin [name] is a 71 yo who I follow for CAD, paroxysmal A-fib, lung cancer - s/p multiple surgeries,  His HR is slow but no episodes of syncope or presyncope Has some ankle   swelling at the end of the day.  He avoids salt.    September 14, 2014:  Doing well from a cardiac standpoint . Has some chronic ankle  edema .  Resolves during night  Has had chronic right ankle edema for years   Jan. 30, 2017:  Benjamin [name] is doing OK . is back in atrial fib - asymptomatic.   Has lung cancer.   Recent reports have been good .   Has had a slow HR for years   October 07, 2015:  Benjamin [name] is seen today for follow up . Has had XRT for recurrent  lung ca.    PET scan did not show any meds.  Feeling ok  Had a complication from a lung biopsy - pneumothorax , Had a chest tube placed .  Complicated by subcutaneous emphysemia  Is more short of breath.   July 17, 2016:  Benjamin [name] is seen today for follow up of his coronary artery disease and atrial fibrillation. He is been started on Eliquis . He seems to be doing well.  No bleeding since being on Eliquis .  His lung cancer seem to be stable.  We discussed his bradycardia He gets lightheaded. Near syncope with walking , has weak spell No syncope   Has had some  leg swelling .   Aug. 17, 2018: Benjamin [name] is seen back today  Has developed gout  ( likely due to lasix ) , better with colchicine   Breathing is unchanged. No hemoptysis     Current Outpatient Prescriptions  Medication Sig Dispense Refill  . apixaban (ELIQUIS) 5 MG TABS tablet Take 1 tablet (5 mg total) by mouth 2 (two) times daily. 60 tablet 11  . atorvastatin (LIPITOR) 20 MG tablet Take 20 mg by mouth every evening.     . clonazePAM (KLONOPIN) 1 MG tablet Take 1 mg by mouth 3 (three) times daily. 8am, 2pm, 8 pm    . colchicine 0.6 MG tablet Take 0.6 mg by mouth as needed.    . metoprolol succinate (TOPROL XL) 25 MG 24 hr tablet Take 1 tablet (25 mg total) by mouth daily. 30 tablet 6  . nitroGLYCERIN (NITROSTAT) 0.4 MG SL tablet Place 1 tablet (0.4 mg total) under the tongue every 5 (five) minutes x 3 doses as needed for chest pain. 25 tablet 3  . potassium chloride (K-DUR) 10  MEQ tablet Take 1 tablet (10 mEq total) by mouth daily. 90 tablet 3  . furosemide (LASIX) 20 MG tablet Take 2 tablets (40 mg total) by mouth daily. (Patient not taking: Reported on 11/03/2016) 180 tablet 3   No current facility-administered medications for this visit.     Allergies  Allergen Reactions  . Codeine Other (See Comments)    Dizziness, stomach pain    Past Medical History:  Diagnosis Date  . Adenopathy    RIGHT ADRENAL  . Anxiety   . Arthritis    KNEES/HANDS  . CAD (coronary artery disease)    a. Prior known CTO of LCX with recanalizationby cath 2008;  b. 09/2012 NSTEMI/Cath/PCI: LM nl, LAD 76m, D1 50p, LCX 100p (2.75x16 Promus Premier DES), 80-15m, OM1 30, RCA 65m (3.0x16 Promus Premier DES), EF 50-55%.  [name] History of echocardiogram    Echo 2/18: Moderate LVH, EF 55-60, normal wall motion, trivial AI, moderate LAE, mild RVE, mild RAE, mildly elevated pulmonary pressure  . Hx of melanoma of skin   . Non-small cell lung cancer ([place])    a. s/p resection and XRT.  [name] PAF (paroxysmal atrial fibrillation) ([place])    a. not currently on anticoagulation (09/2012)    Past Surgical History:  Procedure Laterality Date  . ADRENALEXTOMY  02/2000  . CARDIAC CATHETERIZATION  2008   LAD 40, D1 OK, D2 50, CFX 90, RCA OK, EF 60%; Consider PCI PRN but the stenosis is quite complex and we would end up jailing several large posterolateral branches    . LEFT HEART CATHETERIZATION WITH CORONARY ANGIOGRAM N/A 10/17/2012   Procedure: LEFT HEART CATHETERIZATION WITH CORONARY ANGIOGRAM;  Surgeon: Sherren Mocha, MD;  Location: [facility] CATH LAB;  Service: Cardiovascular;  Laterality: N/A;  . LOBECTOMY LEFT UPPER LOBE  08/15/2001  . PACEMAKER IMPLANT N/A 08/29/2016   Procedure: Pacemaker Implant;  Surgeon: Evans Lance, MD;  Location: [facility] CV LAB;  Service: Cardiovascular;  Laterality: N/A;  . Right VATS, mini thoracotomy, wedge resection of right upper  01/18/2004    History  Smoking Status    . Former Smoker  . Packs/day: 1.00  . Years: 40.00  . Types: Cigarettes  . Quit date: 08/15/2001  Smokeless Tobacco  . Never Used    History  Alcohol Use No    Family History  Problem Relation Age of Onset  . Heart attack Father   .  Cancer Mother        lung  . Cancer Cousin        breast    Review of Systems: The review of systems is per the HPI.  All other systems were reviewed and are negative.  Physical Exam: BP (!) 154/90   Pulse 83   Ht 5\' 11"  (1.803 m)   Wt 209 lb 3.2 oz (94.9 kg)   SpO2 98%   BMI 29.18 kg/m  The patient is alert and oriented x 3.  The mood and affect are normal.  The skin is warm and dry.  Color is normal.   HEENT exam reveals that the sclera are nonicteric.  The mucous membranes are moist.  The carotids are 2+ without bruits.  There is no thyromegaly.  There is no JVD.   Lungs are clear.  The chest wall is non tender.   Cor:   reveals a regular rate with a normal S1 and S2.  [name]      There are no murmurs, gallops, or rubs.  The PMI is not displaced.    Abdominal exam reveals good bowel sounds.  There is no guarding or rebound.  There is no hepatosplenomegaly or tenderness.  There are no masses.   Exam of the legs reveal no clubbing, cyanosis, 2 + edema bilaterally .  The legs are without rashes.  The distal pulses are intact.   Cranial nerves II - XII are intact.  Motor and sensory functions are intact.  The gait is normal.   LABORATORY DATA: CBC/BMET and EKG pending  Lab Results  Component Value Date   WBC 9.6 08/23/2016   HGB 15.4 08/23/2016   HCT 46.6 08/23/2016   PLT 141 (L) 08/23/2016   GLUCOSE 100 (H) 08/23/2016   CHOL 114 09/14/2014   TRIG 102.0 09/14/2014   HDL 40.60 09/14/2014   LDLCALC 53 09/14/2014   ALT 17 09/14/2014   AST 17 09/14/2014   NA 141 08/23/2016   K 4.2 08/23/2016   CL 101 08/23/2016   CREATININE 0.89 08/23/2016   BUN 15 08/23/2016   CO2 25 08/23/2016   TSH 0.774 10/16/2012   INR 1.11 08/24/2015   HGBA1C  5.5 10/16/2012    Lab Results  Component Value Date   TROPONINI 15.20 ([place]) 10/17/2012    Angiographic Findings:  10/16/12  Left main: No obstructive disease.  Left Anterior Descending Artery: Large caliber vessel that courses to the apex. The mid vessel has diffuse 50% stenosis. There are several diagonal branches. The first diagonal branch is moderate in caliber with proximal 50% stenosis. The second and third diagonal branches are small in caliber with mild proximal disease.  Circumflex Artery: 100% proximal occlusion after a very early moderate caliber OM branch. The first OM branch has diffuse 30% stenosis. After PCI, the mid AV groove Circumflex is seen to fill three OM branches. The AV groove Circumflex has diffuse 80-90% stenosis between the takeoff of the second and third OM branches with same appearance as the cath in 2008.  Right Coronary Artery: Large caliber dominant vessel with hazy 90% mid stenosis with appearance of unstable plaque.   Left Ventricular Angiogram: LVEF=50-55%.   Impression:  1. Triple vessel CAD with NSTEMI secondary to acute occlusion of Circumflex.  2. Severe stenosis, unstable plaque mid RCA  3. NSTEMI  4. Preserved LV systolic function  5. Successful PTCA/DES x 1 mid RCA  6. Successful PTCA/DES x 1 proximal Circumflex   Recommendations: He will  need dual anti-platelet therapy with ASA and Effient for one year. Continue statin. No beta blocker with heart block.   Complications: None. The patient tolerated the procedure well.   ECG    Assessment / Plan:  1. CAD - DES to RCA and prox LCx.  (10/16/12) -  No angina , continue same meds   2. Persistent Atrial fib: -    Stable , HR is slow  He has been started on Eliquis  5 mg twice a day since I last saw him. We have been given the clearance from his medical team to start him on this. He has recurrent lung cancer but overall this seems to be stable.  He has had recurrent lung cancer ( multiple  recurrences )  and in the past I've been hesitant to start eliuqis.   We have been given the clearance to start Eliquis and he has done well without any bleeding    3. Non-small cell lung ca. -  Has recurrent lung cancer that was found in May, 2017, just finished XRT   4. Leg edema -   Chronic .   Likely is due to his pulmonary issues vs. Presumed Diastolic CHF   5. Hyperlipidemia:   Will check labs today    Benjamin Moores, MD  11/03/2016 11:34 AM    [facility] [place],  [address] [place], [place]  [address] Pager [phone] Phone: [phone]; Fax: [phone]

## 2016-11-03 NOTE — Patient Instructions (Signed)
Medication Instructions:  Your physician recommends that you continue on your current medications as directed. Please refer to the Current Medication list given to you today.   Labwork: TODAY - liver panel, basic metabolic panel, cholesterol   Testing/Procedures: None Ordered   Follow-Up: Your physician wants you to follow-up in: 6 months with Dr. Acie Fredrickson.  You will receive a reminder letter in the mail two months in advance. If you don't receive a letter, please call our office to schedule the follow-up appointment.   If you need a refill on your cardiac medications before your next appointment, please call your pharmacy.   Thank you for choosing [facility]! Christen Bame, RN [phone]

## 2016-11-07 ENCOUNTER — Telehealth: Payer: Self-pay | Admitting: *Deleted

## 2016-11-07 ENCOUNTER — Other Ambulatory Visit: Payer: Self-pay | Admitting: Radiation Oncology

## 2016-11-07 DIAGNOSIS — C3431 Malignant neoplasm of lower lobe, right bronchus or lung: Secondary | ICD-10-CM

## 2016-11-07 NOTE — Telephone Encounter (Signed)
CALLED PATIENT  TO INFORM OF CT FOR 11-08-16 - ARRIVAL TIME - 2:45 PM , PT. TO BE NPO - 4 HRS. PRIOR TO TEST, TEST TO BE @ [facility], PT. TO GET RESULTS ON 11-09-16 WITH ALISON PERKINS, PT. AWARE OF THESE APPTS.

## 2016-11-08 ENCOUNTER — Ambulatory Visit (HOSPITAL_COMMUNITY)
Admission: RE | Admit: 2016-11-08 | Discharge: 2016-11-08 | Disposition: A | Discharge status: Home / Self Care | Payer: Medicare Other | Source: Ambulatory Visit | Attending: Radiation Oncology | Admitting: Radiation Oncology

## 2016-11-08 ENCOUNTER — Encounter (HOSPITAL_COMMUNITY): Payer: Self-pay

## 2016-11-08 DIAGNOSIS — C3431 Malignant neoplasm of lower lobe, right bronchus or lung: Secondary | ICD-10-CM | POA: Insufficient documentation

## 2016-11-08 DIAGNOSIS — R918 Other nonspecific abnormal finding of lung field: Secondary | ICD-10-CM | POA: Insufficient documentation

## 2016-11-08 MED ORDER — IOPAMIDOL (ISOVUE-300) INJECTION 61%
INTRAVENOUS | Status: AC
  Administered 2016-11-08: 75 mL
  Filled 2016-11-08: qty 75

## 2016-11-09 ENCOUNTER — Encounter: Payer: Self-pay | Admitting: Radiation Oncology

## 2016-11-09 ENCOUNTER — Ambulatory Visit
Admission: RE | Admit: 2016-11-09 | Discharge: 2016-11-09 | Disposition: A | Discharge status: Home / Self Care | Payer: Medicare Other | Source: Ambulatory Visit | Attending: Radiation Oncology | Admitting: Radiation Oncology

## 2016-11-09 VITALS — BP 157/91 | HR 100 | Temp 97.9°F | Resp 18 | Wt 210.0 lb

## 2016-11-09 DIAGNOSIS — S2239XA Fracture of one rib, unspecified side, initial encounter for closed fracture: Secondary | ICD-10-CM | POA: Diagnosis not present

## 2016-11-09 DIAGNOSIS — Z9889 Other specified postprocedural states: Secondary | ICD-10-CM | POA: Insufficient documentation

## 2016-11-09 DIAGNOSIS — C3431 Malignant neoplasm of lower lobe, right bronchus or lung: Secondary | ICD-10-CM | POA: Diagnosis not present

## 2016-11-09 DIAGNOSIS — Z08 Encounter for follow-up examination after completed treatment for malignant neoplasm: Secondary | ICD-10-CM | POA: Diagnosis not present

## 2016-11-09 MED ORDER — GABAPENTIN 300 MG PO CAPS: 300.0000 mg | ORAL_CAPSULE | Freq: Three times a day (TID) | ORAL | 6 refills | Status: DC

## 2016-11-09 NOTE — Progress Notes (Signed)
Tried to take [name] Oxygen saturation multiple times could not get a reading. Patient stated that he has raynaud diease and that is the reason the oxygen level was not reading. Patient denied being sob. He was breathing unlabored. [name] is aware that we could not get a reading and stated that it was ok.

## 2016-11-12 NOTE — Progress Notes (Signed)
Radiation Oncology         [phone] ________________________________  Name: Benjamin [name] MRN: [id number]  Date: 11/09/2016  DOB: 11-30-1945  Follow-Up Visit Note  CC: Benjamin Manes, MD  Benjamin [name], *  Diagnosis:  Multifocal Stage I, NSCLC of the left upper lobe, right upper lobe, left lower lobe, and now of the right lower lobe.   Interval Since Last Radiation:  10 months  09/20/15, 09/23/15, 09/27/15: SBRT to the right lower lobe was treated to 54 Gy in 3 fractions of 18 Gy.  06/03/07- 07/05/07: 62.5 Gy in 25 fractions of 2.5 Gy to an inferior left upper lobe target  Narrative:  Benjamin [name] is a pleasant 71 y.o. gentleman with a history of multifocal Stage I NSCLC, of the right and left upper lobes. He has previously undergone resection of the left upper lobe in 2003, as well as a resection of the right upper lobe in 2005,and does have some chronic issues with breathing as a result of lower lung volume. He completed his most recent course of SBRT in July 2017 to the right lower lobe, and comes today for surveillance. His post treatment CT in August revealed a slight decrease in the size of the right lower lobe, stability with post radiation change in the left lower lobe, and no evidence of new disease. Since February 2018, we've been watching a  8 mm ill definite density in the superior segment of the right lower lobe. Short interval scan on 07/28/16 revealed stability of the 61mm nodule in the superior aspect of the right lower lobe. There was also ground glass attenuation in the RLL with a solid component and the component was previously 1.1 cm, and in May, was 2.1 cm. 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 Again we discussed options for short interval scan and he comes today to review this from 11/08/16. Stability of theright lower lobe 6 mm nodule, and right lower lobe consolidation was stable and present, and the prior noted ground glass nodule was less dense in the right  middle lobe. Along the left lung, persistent consolidation was stable as was the post surgical change in the left upper lobe. He does appear to have a fracture around right anterior/lateral rib 6 from my read, though this was not listed in the report. This appears to have been a new finding since his scan on 07/28/16. He comes to review these results.    On review of systems, the patient reports that he is doing well overall. He denies any sternal chest pain, shortness of breath, cough, fevers, chills, night sweats, unintended weight changes. He does however complain of right breast and chest wall pain. He describes this as starting in the last 3 weeks. It seems to have come on without incident or injury he can recall. He denies any palpable breast mass or nipple bleeding. He denies any bowel or bladder disturbances, and denies abdominal pain, nausea or vomiting. He denies any new musculoskeletal or joint aches or pains, new skin lesions or concerns. A complete review of systems is obtained and is otherwise negative.      Past Medical History:  Past Medical History:  Diagnosis Date  . Adenopathy    RIGHT ADRENAL  . Anxiety   . Arthritis    KNEES/HANDS  . CAD (coronary artery disease)    a. Prior known CTO of LCX with recanalizationby cath 2008;  b. 09/2012 NSTEMI/Cath/PCI: LM nl, LAD 80m, D1 50p, LCX  100p (2.75x16 Promus Premier DES), 80-64m, OM1 30, RCA 39m (3.0x16 Promus Premier DES), EF 50-55%.  [name] History of echocardiogram    Echo 2/18: Moderate LVH, EF 55-60, normal wall motion, trivial AI, moderate LAE, mild RVE, mild RAE, mildly elevated pulmonary pressure  . Hx of melanoma of skin   . Non-small cell lung cancer ([place])    a. s/p resection and XRT.  [name] PAF (paroxysmal atrial fibrillation) ([place])    a. not currently on anticoagulation (09/2012)    Past Surgical History: Past Surgical History:  Procedure Laterality Date  . ADRENALEXTOMY  02/2000  . CARDIAC CATHETERIZATION  2008   LAD 40,  D1 OK, D2 50, CFX 90, RCA OK, EF 60%; Consider PCI PRN but the stenosis is quite complex and we would end up jailing several large posterolateral branches    . LEFT HEART CATHETERIZATION WITH CORONARY ANGIOGRAM N/A 10/17/2012   Procedure: LEFT HEART CATHETERIZATION WITH CORONARY ANGIOGRAM;  Surgeon: Sherren Mocha, MD;  Location: [facility] CATH LAB;  Service: Cardiovascular;  Laterality: N/A;  . LOBECTOMY LEFT UPPER LOBE  08/15/2001  . PACEMAKER IMPLANT N/A 08/29/2016   Procedure: Pacemaker Implant;  Surgeon: Evans Lance, MD;  Location: [place] [facility] CV LAB;  Service: Cardiovascular;  Laterality: N/A;  . Right VATS, mini thoracotomy, wedge resection of right upper  01/18/2004    Social History:  Social History   Social History  . Marital status: Divorced    Spouse name: N/A  . Number of children: N/A  . Years of education: N/A   Occupational History  . 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 Smoking status: Former Smoker    Packs/day: 1.00    Years: 40.00    Types: Cigarettes    Quit date: 08/15/2001  . Smokeless tobacco: Never Used  . Alcohol use No  . Drug use: No  . Sexual activity: Not Currently   Other Topics Concern  . Not on file   Social History Narrative  . No narrative on file    Family History: Family History  Problem Relation Age of Onset  . Heart attack Father   . Cancer Mother        lung  . Cancer Cousin        breast    ALLERGIES:  is allergic to codeine.  Meds: Current Outpatient Prescriptions  Medication Sig Dispense Refill  . apixaban (ELIQUIS) 5 MG TABS tablet Take 1 tablet (5 mg total) by mouth 2 (two) times daily. 60 tablet 11  . atorvastatin (LIPITOR) 20 MG tablet Take 20 mg by mouth every evening.     . clonazePAM (KLONOPIN) 1 MG tablet Take 1 mg by mouth 3 (three) times daily. 8am, 2pm, 8 pm    . metoprolol succinate (TOPROL XL) 25 MG 24 hr tablet Take 1 tablet (25 mg total) by mouth daily. 30 tablet 6  . nitroGLYCERIN  (NITROSTAT) 0.4 MG SL tablet Place 1 tablet (0.4 mg total) under the tongue every 5 (five) minutes x 3 doses as needed for chest pain. 25 tablet 3  . gabapentin (NEURONTIN) 300 MG capsule Take 1 capsule (300 mg total) by mouth 3 (three) times daily. 90 capsule 6   No current facility-administered medications for this encounter.     Physical Findings:  weight is 210 lb (95.3 kg). His oral temperature is 97.9 F (36.6 C). His blood pressure is 157/91 (abnormal) and his pulse is 100. His respiration is 18.   Pain Assessment  Pain Score: 0-No pain/10 In general this is a well appearing caucasian male in no acute distress. He is alert and oriented x4 and appropriate throughout the examination. HEENT reveals that the patient is normocephalic, atraumatic. EOMs are intact. PERRLA. Skin is intact without any evidence of gross lesions. Cardiovascular exam reveals a regular rate and rhythm, no clicks rubs or murmurs are auscultated. Chest is clear to auscultation bilaterally. Lymphatic assessment is performed and does not reveal any adenopathy in the cervical, supraclavicular, axillary, or inguinal chains. The right breast is examined and does not reveal any palpable masses or nipple bleeding/discharge. Palpation of the chest however does appear to reveal reproducible pain along the right anteriolateral 6th rib. Abdomen has active bowel sounds in all quadrants and is intact. The abdomen is soft, non tender, non distended. Lower extremities are negative for pretibial pitting edema, deep calf tenderness, cyanosis or clubbing.     Lab Findings: Lab Results  Component Value Date   WBC 9.6 08/23/2016   HGB 15.4 08/23/2016   HCT 46.6 08/23/2016   MCV 88 08/23/2016   PLT 141 (L) 08/23/2016     Radiographic Findings: Ct Chest W Contrast  Result Date: 11/09/2016 CLINICAL DATA:  Followup pulmonary nodules. RIGHT-sided chest pain. Lung cancer with lobectomy. EXAM: CT CHEST WITH CONTRAST TECHNIQUE:  Multidetector CT imaging of the chest was performed during intravenous contrast administration. CONTRAST:  57mL ISOVUE-300 IOPAMIDOL (ISOVUE-300) INJECTION 61% COMPARISON:  None. FINDINGS: Cardiovascular: Coronary artery calcification and aortic atherosclerotic calcification. Mediastinum/Nodes: No axillary or supraclavicular lymphadenopathy. No mediastinal hilar lymphadenopathy. Lungs/Pleura: RIGHT lung is hyperexpanded. 6 mm nodule in the superior aspect of the RIGHT lower lobe is unchanged (image 60, series 5 Band of linear consolidation in the RIGHT lower lobe measures 4.1 cm in greatest length unchanged from prior. No new nodularity. Peripheral band of ground-glass and minimal nodularity in the lateral RIGHT middle lobe is less dense (image 85, series 5) Within LEFT lung of peripheral consolidation along the pleural surface measures 5.6 by 3.5 cm not changed from 5.8 by 3 point 2 cm. Postsurgical change consistent LEFT upper lobectomy. No new nodularity. Upper Abdomen: Limited view of the liver, kidneys, pancreas are unremarkable. Normal adrenal glands. Musculoskeletal: No aggressive osseous lesion IMPRESSION: 1. Stable RIGHT lung nodules and parenchymal scarring. 2. Stable peripheral consolidation in the LEFT lung. 3. No new or enlarged pulmonary nodularity. Electronically Signed   By: Suzy Bouchard M.D.   On: 11/09/2016 08:26    Impression/Plan: 1. Multiple Stage I, NSCLC of the left upper lobe, right upper lobe, left lower lobe, and right lower lobe. The patient appears to be doing well radiographically and we will plan to repeat his CT scan in 6 months. He is in agreement and will call sooner if he has questions or concerns. 2. Right chest wall pain with radiographic changes consistent with rib fracture. In reviewing his scans from his recent CT, it appears he has a rib fracture along the right chest about the level of rib 6 anteriolaterally. I have discussed the use of gabapentin and sent this to  his pharmacy. He's in agreement to try this and will let us know if his symptoms do not improve.     Carola Rhine, PAC

## 2016-12-05 ENCOUNTER — Encounter: Payer: Self-pay | Admitting: Internal Medicine

## 2016-12-05 ENCOUNTER — Ambulatory Visit (INDEPENDENT_AMBULATORY_CARE_PROVIDER_SITE_OTHER): Payer: Medicare Other | Admitting: Internal Medicine

## 2016-12-05 VITALS — BP 150/104 | HR 77 | Ht 71.0 in | Wt 207.6 lb

## 2016-12-05 DIAGNOSIS — I2510 Atherosclerotic heart disease of native coronary artery without angina pectoris: Secondary | ICD-10-CM

## 2016-12-05 DIAGNOSIS — I442 Atrioventricular block, complete: Secondary | ICD-10-CM | POA: Diagnosis not present

## 2016-12-05 DIAGNOSIS — I482 Chronic atrial fibrillation, unspecified: Secondary | ICD-10-CM

## 2016-12-05 LAB — CUP PACEART INCLINIC DEVICE CHECK
Battery Voltage: 3.09 V
Brady Statistic AP VP Percent: 0 %
Brady Statistic AS VP Percent: 0 %
Brady Statistic RA Percent Paced: 97.07 %
Implantable Lead Implant Date: 20180612
Implantable Lead Implant Date: 20180612
Implantable Lead Location: 753860
Implantable Lead Location: 753860
Implantable Lead Model: 3830
Implantable Pulse Generator Implant Date: 20180612
Lead Channel Impedance Value: 304 Ohm
Lead Channel Impedance Value: 475 Ohm
Lead Channel Pacing Threshold Amplitude: 0.5 V
Lead Channel Pacing Threshold Amplitude: 0.5 V
Lead Channel Pacing Threshold Pulse Width: 0.4 ms
Lead Channel Pacing Threshold Pulse Width: 1 ms
Lead Channel Sensing Intrinsic Amplitude: 20.125 mV
Lead Channel Setting Pacing Amplitude: 2 V
Lead Channel Setting Pacing Pulse Width: 0.4 ms
Lead Channel Setting Sensing Sensitivity: 0.9 mV
MDC IDC MSMT BATTERY REMAINING LONGEVITY: 130 mo
MDC IDC MSMT LEADCHNL RA IMPEDANCE VALUE: 494 Ohm
MDC IDC MSMT LEADCHNL RA SENSING INTR AMPL: 7 mV
MDC IDC MSMT LEADCHNL RA SENSING INTR AMPL: 7 mV
MDC IDC MSMT LEADCHNL RV IMPEDANCE VALUE: 532 Ohm
MDC IDC MSMT LEADCHNL RV SENSING INTR AMPL: 20.125 mV
MDC IDC SESS DTM: 20180918103534
MDC IDC SET LEADCHNL RV PACING AMPLITUDE: 2 V
MDC IDC STAT BRADY AP VS PERCENT: 97.08 %
MDC IDC STAT BRADY AS VS PERCENT: 2.91 %
MDC IDC STAT BRADY RV PERCENT PACED: 0 %

## 2016-12-05 NOTE — Patient Instructions (Addendum)
Medication Instructions:  Your physician recommends that you continue on your current medications as directed. Please refer to the Current Medication list given to you today.  Labwork: None ordered.  Testing/Procedures: None ordered.  Follow-Up: Your physician wants you to follow-up in: 9 months with Dr. Lovena Le.   You will receive a reminder letter in the mail two months in advance. If you don't receive a letter, please call our office to schedule the follow-up appointment.  Remote monitoring is used to monitor your Pacemaker from home. This monitoring reduces the number of office visits required to check your device to one time per year. It allows [place] to keep an eye on the functioning of your device to ensure it is working properly. You are scheduled for a device check from home on 03/06/2017. You may send your transmission at any time that day. If you have a wireless device, the transmission will be sent automatically. After your physician reviews your transmission, you will receive a postcard with your next transmission date.    Any Other Special Instructions Will Be Listed Below (If Applicable).     If you need a refill on your cardiac medications before your next appointment, please call your pharmacy.

## 2016-12-05 NOTE — Progress Notes (Signed)
HPI [name] returns today for ongoing evaluation and management of his atrial fibrillation, complete heart block, status post permanent pacemaker insertion. He is a very pleasant 71 year old man who underwent permanent pacemaker insertion approximately 3 months ago. His bundle pacemaker was placed at that time with the history the lead placed in the atrial port and an RV lead placed in the ventricular port for backup pacing. The patient has done well in the interim except for some lower extremity swelling. He admits to dietary indiscretion with sodium. He denies chest pain, but does have very mild dyspnea with exertion. No syncope. Allergies  Allergen Reactions  . Codeine Other (See Comments)    Dizziness, stomach pain     Current Outpatient Prescriptions  Medication Sig Dispense Refill  . apixaban (ELIQUIS) 5 MG TABS tablet Take 1 tablet (5 mg total) by mouth 2 (two) times daily. 60 tablet 11  . atorvastatin (LIPITOR) 20 MG tablet Take 20 mg by mouth every evening.     . clonazePAM (KLONOPIN) 1 MG tablet Take 1 mg by mouth 3 (three) times daily. 8am, 2pm, 8 pm    . metoprolol succinate (TOPROL XL) 25 MG 24 hr tablet Take 1 tablet (25 mg total) by mouth daily. 30 tablet 6  . nitroGLYCERIN (NITROSTAT) 0.4 MG SL tablet Place 1 tablet (0.4 mg total) under the tongue every 5 (five) minutes x 3 doses as needed for chest pain. 25 tablet 3  . gabapentin (NEURONTIN) 300 MG capsule Take 1 capsule (300 mg total) by mouth 3 (three) times daily. (Patient not taking: Reported on 12/05/2016) 90 capsule 6   No current facility-administered medications for this visit.      Past Medical History:  Diagnosis Date  . Adenopathy    RIGHT ADRENAL  . Anxiety   . Arthritis    KNEES/HANDS  . CAD (coronary artery disease)    a. Prior known CTO of LCX with recanalizationby cath 2008;  b. 09/2012 NSTEMI/Cath/PCI: LM nl, LAD 73m, D1 50p, LCX 100p (2.75x16 Promus Premier DES), 80-63m, OM1 30, RCA 62m  (3.0x16 Promus Premier DES), EF 50-55%.  [name] History of echocardiogram    Echo 2/18: Moderate LVH, EF 55-60, normal wall motion, trivial AI, moderate LAE, mild RVE, mild RAE, mildly elevated pulmonary pressure  . Hx of melanoma of skin   . Non-small cell lung cancer ([place])    a. s/p resection and XRT.  [name] PAF (paroxysmal atrial fibrillation) ([place])    a. not currently on anticoagulation (09/2012)    ROS:   All systems reviewed and negative except as noted in the HPI.   Past Surgical History:  Procedure Laterality Date  . ADRENALEXTOMY  02/2000  . CARDIAC CATHETERIZATION  2008   LAD 40, D1 OK, D2 50, CFX 90, RCA OK, EF 60%; Consider PCI PRN but the stenosis is quite complex and we would end up jailing several large posterolateral branches    . LEFT HEART CATHETERIZATION WITH CORONARY ANGIOGRAM N/A 10/17/2012   Procedure: LEFT HEART CATHETERIZATION WITH CORONARY ANGIOGRAM;  Surgeon: Sherren Mocha, MD;  Location: [facility] CATH LAB;  Service: Cardiovascular;  Laterality: N/A;  . LOBECTOMY LEFT UPPER LOBE  08/15/2001  . PACEMAKER IMPLANT N/A 08/29/2016   Procedure: Pacemaker Implant;  Surgeon: Evans Lance, MD;  Location: [facility] CV LAB;  Service: Cardiovascular;  Laterality: N/A;  . Right VATS, mini thoracotomy, wedge resection of right upper  01/18/2004     Family History  Problem Relation  Age of Onset  . Heart attack Father   . Cancer Mother        lung  . Cancer Cousin        breast     Social History   Social History  . Marital status: Divorced    Spouse name: N/A  . Number of children: N/A  . Years of education: N/A   Occupational History  . 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 Smoking status: Former Smoker    Packs/day: 1.00    Years: 40.00    Types: Cigarettes    Quit date: 08/15/2001  . Smokeless tobacco: Never Used  . Alcohol use No  . Drug use: No  . Sexual activity: Not Currently   Other Topics Concern  . Not on file   Social  History Narrative  . No narrative on file     BP (!) 150/104   Pulse 77   Ht 5\' 11"  (1.803 m)   Wt 207 lb 9.6 oz (94.2 kg)   SpO2 99%   BMI 28.95 kg/m   Physical Exam:  Well appearing 71 year old man, NAD HEENT: Unremarkable Neck:  6 cm JVD, no thyromegally Lymphatics:  No adenopathy Back:  No CVA tenderness Lungs:  Clear, except for rare basilar rales HEART:  Regular rate rhythm, no murmurs, no rubs, no clicks Abd:  soft, positive bowel sounds, no organomegally, no rebound, no guarding Ext:  2 plus pulses, 1+ peripheral edema, no cyanosis, no clubbing Skin:  No rashes no nodules Neuro:  CN II through XII intact, motor grossly intact  EKG - atrial fibrillation with ventricular pacing  DEVICE  Normal device function.  See PaceArt for details.   Assess/Plan: 1. Complete heart block - he is doing well status post permanent pacemaker insertion. He'll continue his current medications. 2. Pacemaker - his Medtronic dual-chamber pacemaker is working normally. We'll recheck in several months. 3. Chronic atrial fibrillation - his ventricular rate is well controlled. He will continue systemic anticoagulation. 4. Chronic peripheral edema - we discussed treatment options in detail. He is unwilling to take another diuretic. I've encouraged the patient to wear support stockings but he is not inclined to do so. He is instructed to reduce his salt intake. He admits to sodium indiscretion.  Benjamin [name], M.D.

## 2016-12-06 ENCOUNTER — Telehealth: Payer: Self-pay | Admitting: Radiation Oncology

## 2016-12-06 ENCOUNTER — Other Ambulatory Visit: Payer: Self-pay | Admitting: Radiation Oncology

## 2016-12-06 DIAGNOSIS — C3400 Malignant neoplasm of unspecified main bronchus: Secondary | ICD-10-CM

## 2016-12-06 DIAGNOSIS — R030 Elevated blood-pressure reading, without diagnosis of hypertension: Secondary | ICD-10-CM | POA: Diagnosis not present

## 2016-12-06 DIAGNOSIS — S61412A Laceration without foreign body of left hand, initial encounter: Secondary | ICD-10-CM | POA: Diagnosis not present

## 2016-12-06 NOTE — Telephone Encounter (Signed)
Rescheduled f/u visit in Feb 2019 for Benjamin [name] to get CT results. Confirmed with patient.

## 2016-12-15 ENCOUNTER — Ambulatory Visit (INDEPENDENT_AMBULATORY_CARE_PROVIDER_SITE_OTHER): Payer: Medicare Other | Admitting: *Deleted

## 2016-12-15 DIAGNOSIS — I4891 Unspecified atrial fibrillation: Secondary | ICD-10-CM

## 2016-12-15 DIAGNOSIS — Z5181 Encounter for therapeutic drug level monitoring: Secondary | ICD-10-CM

## 2016-12-15 NOTE — Patient Instructions (Signed)
  A full discussion of the nature of anticoagulants has been carried out.  A benefit/risk analysis has been presented to the patient, so that they understand the justification for choosing anticoagulation with Eliquis at this time.  The need for compliance is stressed.  Pt is aware to take the medication twice daily.  Side effects of potential bleeding are discussed, including unusual colored urine or stools, coughing up blood or coffee ground emesis, nose bleeds or serious fall or head trauma.  Discussed signs and symptoms of stroke. The patient should avoid any OTC items containing aspirin or ibuprofen.  Avoid alcohol consumption.   Call if any signs of abnormal bleeding.  Discussed financial obligations and resolved any difficulty in obtaining medication.  Call us with any concerns, refill or questions [phone] [facility], Main # [phone].

## 2016-12-15 NOTE — Progress Notes (Signed)
Pt was started on Eliquis for Afib on 05/03/16.    Reviewed patients medication list.  Pt is not  currently on any combined P-gp and strong CYP3A4 inhibitors/inducers (ketoconazole, traconazole, ritonavir, carbamazepine, phenytoin, rifampin, St. John's wort).  Reviewed labs.  SCr 1.0 , Weight 95 Kg, Age 71 yrs old. Dose appropriate based on age, weight, and SCr.  Hgb and HCT from 08/2016 15.4/46.6.   A full discussion of the nature of anticoagulants has been carried out.  A benefit/risk analysis has been presented to the patient, so that they understand the justification for choosing anticoagulation with Eliquis at this time.  The need for compliance is stressed.  Pt is aware to take the medication twice daily.  Side effects of potential bleeding are discussed, including unusual colored urine or stools, coughing up blood or coffee ground emesis, nose bleeds or serious fall or head trauma.  Discussed signs and symptoms of stroke. The patient should avoid any OTC items containing aspirin or ibuprofen.  Avoid alcohol consumption.   Call if any signs of abnormal bleeding.  Discussed financial obligations and resolved any difficulty in obtaining medication. Follow up with cardiology as scheduled.

## 2017-01-02 DIAGNOSIS — D225 Melanocytic nevi of trunk: Secondary | ICD-10-CM | POA: Diagnosis not present

## 2017-01-02 DIAGNOSIS — L218 Other seborrheic dermatitis: Secondary | ICD-10-CM | POA: Diagnosis not present

## 2017-01-02 DIAGNOSIS — L570 Actinic keratosis: Secondary | ICD-10-CM | POA: Diagnosis not present

## 2017-01-02 DIAGNOSIS — Z85820 Personal history of malignant melanoma of skin: Secondary | ICD-10-CM | POA: Diagnosis not present

## 2017-01-02 DIAGNOSIS — L814 Other melanin hyperpigmentation: Secondary | ICD-10-CM | POA: Diagnosis not present

## 2017-01-02 DIAGNOSIS — L821 Other seborrheic keratosis: Secondary | ICD-10-CM | POA: Diagnosis not present

## 2017-01-15 ENCOUNTER — Telehealth: Payer: Self-pay | Admitting: Cardiovascular Disease

## 2017-01-15 NOTE — Telephone Encounter (Signed)
The pt is advised that the office can no longer provide Eliquis samples to him as we have been since 05/02/16 when he started taking it. I offered to do a tier exception but he asked me not to because he is afraid it may interfere with the financial help that [employer] (the company he retired from) is giving him towards his medications.   I advised him that if he does not want me to do a tier exception, he does not need a PA, and he does not qualify for pt assistance his only other options would be switching to Coumadin or pay out of pocket for his Eliquis.  He states that he will call [employer] to find out if a tier exception will interfere with the financial help they give him. If so he states that he will pay for his Eliquis out of pocket and if not he will call me back and let me know it is ok to do a tier exception.

## 2017-01-15 NOTE — Telephone Encounter (Signed)
New message    Patient calling the office for samples of medication:   1.  What medication and dosage are you requesting samples for? apixaban (ELIQUIS) 5 MG TABS tablet  2.  Are you currently out of this medication? 2 more days

## 2017-01-18 DIAGNOSIS — H353190 Nonexudative age-related macular degeneration, unspecified eye, stage unspecified: Secondary | ICD-10-CM

## 2017-01-18 HISTORY — DX: Nonexudative age-related macular degeneration, unspecified eye, stage unspecified: H35.3190

## 2017-01-24 DIAGNOSIS — H25812 Combined forms of age-related cataract, left eye: Secondary | ICD-10-CM | POA: Diagnosis not present

## 2017-01-24 DIAGNOSIS — H2511 Age-related nuclear cataract, right eye: Secondary | ICD-10-CM | POA: Diagnosis not present

## 2017-01-24 DIAGNOSIS — H02831 Dermatochalasis of right upper eyelid: Secondary | ICD-10-CM | POA: Diagnosis not present

## 2017-01-24 DIAGNOSIS — H353122 Nonexudative age-related macular degeneration, left eye, intermediate dry stage: Secondary | ICD-10-CM | POA: Diagnosis not present

## 2017-02-16 ENCOUNTER — Telehealth: Payer: Self-pay

## 2017-02-16 NOTE — Telephone Encounter (Signed)
**Note De-Identified Azka Steger Obfuscation** The pt called the office requesting samples of Eliquis.  The pt was remained of our phone conversation that he and I had on 10/29 when he decided that he would pay for his Eliquis out of pocket.  Phone note from 01/15/17:  The pt is advised that the office can no longer provide Eliquis samples to him as we have been since 05/02/16 when he started taking it. I offered to do a tier exception but he asked me not to because he is afraid it may interfere with the financial help that [employer] (the company he retired from) is giving him towards his medications.   I advised him that if he does not want me to do a tier exception, he does not need a PA, and he does not qualify for pt assistance his only other options would be switching to Coumadin or pay out of pocket for his Eliquis.  He states that he will call [employer] to find out if a tier exception will interfere with the financial help they give him. If so he states that he will pay for his Eliquis out of pocket and if not he will call me back and let me know it is ok to do a tier exception.   The pt did not call me back until today when he called asking for Eliquis samples. Again he does not want me to do a tier exception or apply for pt assistance and does not want to switch to a lower costing medication. He states that he will pay for his Eliquis out of pocket.

## 2017-03-06 ENCOUNTER — Telehealth: Payer: Self-pay | Admitting: Cardiology

## 2017-03-06 ENCOUNTER — Encounter: Payer: Medicare Other | Admitting: *Deleted

## 2017-03-06 NOTE — Telephone Encounter (Signed)
LMOVM reminding pt to send remote transmission.   

## 2017-03-15 DIAGNOSIS — F411 Generalized anxiety disorder: Secondary | ICD-10-CM | POA: Diagnosis not present

## 2017-03-19 ENCOUNTER — Telehealth: Payer: Self-pay | Admitting: Cardiovascular Disease

## 2017-03-19 NOTE — Telephone Encounter (Signed)
NEw Message  Patient calling the office for samples of medication:   1.  What medication and dosage are you requesting samples for? eliquis 5mg    2.  Are you currently out of this medication? Yes

## 2017-03-19 NOTE — Telephone Encounter (Signed)
Called pt to inform him that we do not have any samples of Eliquis at this time and that he could call back later in the week to see if we have gotten any in. Pt verbalized understanding. I also reminded pt that he spoke with Mardene Celeste Via, LPN, that pt did not want to apply for tier reduction and pt still stated that he did not want to apply.

## 2017-04-14 DIAGNOSIS — B349 Viral infection, unspecified: Secondary | ICD-10-CM | POA: Diagnosis not present

## 2017-04-14 DIAGNOSIS — J069 Acute upper respiratory infection, unspecified: Secondary | ICD-10-CM | POA: Diagnosis not present

## 2017-04-16 ENCOUNTER — Telehealth: Payer: Self-pay | Admitting: Cardiovascular Disease

## 2017-04-16 MED ORDER — METOPROLOL SUCCINATE ER 25 MG PO TB24: 25.0000 mg | ORAL_TABLET | Freq: Every day | ORAL | 8 refills | Status: DC

## 2017-04-16 NOTE — Telephone Encounter (Signed)
Pt's medication was sent to pt's pharmacy as requested. Confirmation received.  °

## 2017-04-16 NOTE — Telephone Encounter (Signed)
New message     *STAT* If patient is at the pharmacy, call can be transferred to refill team.   1. Which medications need to be refilled? (please list name of each medication and dose if known) metoprolol succinate (TOPROL XL) 25 MG 24 hr tablet  2. Which pharmacy/location (including street and city if local pharmacy) is medication to be sent to? [facility]- [place]  3. Do they need a 30 day or 90 day supply? [place]

## 2017-04-20 DIAGNOSIS — C4442 Squamous cell carcinoma of skin of scalp and neck: Secondary | ICD-10-CM

## 2017-04-20 HISTORY — DX: Squamous cell carcinoma of skin of scalp and neck: C44.42

## 2017-05-01 ENCOUNTER — Other Ambulatory Visit: Payer: Self-pay

## 2017-05-01 DIAGNOSIS — C4442 Squamous cell carcinoma of skin of scalp and neck: Secondary | ICD-10-CM | POA: Diagnosis not present

## 2017-05-01 DIAGNOSIS — L570 Actinic keratosis: Secondary | ICD-10-CM | POA: Diagnosis not present

## 2017-05-01 DIAGNOSIS — L905 Scar conditions and fibrosis of skin: Secondary | ICD-10-CM | POA: Diagnosis not present

## 2017-05-01 DIAGNOSIS — Z85820 Personal history of malignant melanoma of skin: Secondary | ICD-10-CM | POA: Diagnosis not present

## 2017-05-02 ENCOUNTER — Ambulatory Visit: Payer: Self-pay | Admitting: Radiation Oncology

## 2017-05-04 NOTE — Progress Notes (Incomplete)
Multifocal Stage I, NSCLC of the left upper lobe, right upper lobe, left lower lobe, and now of the right lower lobe, review 05-07-17 CT chest w contrast, FU.    Weight changes, if any: Respiratory complaints, if any:  Swallowing Problems/Pain/Difficulty swallowing: Appetite : Pain:

## 2017-05-07 ENCOUNTER — Ambulatory Visit (HOSPITAL_COMMUNITY)
Admission: RE | Admit: 2017-05-07 | Discharge: 2017-05-07 | Disposition: A | Discharge status: Home / Self Care | Payer: Medicare Other | Source: Ambulatory Visit | Attending: Radiation Oncology | Admitting: Radiation Oncology

## 2017-05-07 ENCOUNTER — Encounter: Payer: Self-pay | Admitting: Radiation Oncology

## 2017-05-07 ENCOUNTER — Ambulatory Visit
Admission: RE | Admit: 2017-05-07 | Discharge: 2017-05-07 | Disposition: A | Discharge status: Home / Self Care | Payer: Medicare Other | Source: Ambulatory Visit | Attending: Oncology | Admitting: Oncology

## 2017-05-07 ENCOUNTER — Telehealth: Payer: Self-pay | Admitting: Radiation Oncology

## 2017-05-07 DIAGNOSIS — I700 Atherosclerosis of aorta: Secondary | ICD-10-CM | POA: Insufficient documentation

## 2017-05-07 DIAGNOSIS — I2510 Atherosclerotic heart disease of native coronary artery without angina pectoris: Secondary | ICD-10-CM | POA: Insufficient documentation

## 2017-05-07 DIAGNOSIS — C3402 Malignant neoplasm of left main bronchus: Secondary | ICD-10-CM

## 2017-05-07 DIAGNOSIS — R918 Other nonspecific abnormal finding of lung field: Secondary | ICD-10-CM | POA: Diagnosis not present

## 2017-05-07 DIAGNOSIS — C3490 Malignant neoplasm of unspecified part of unspecified bronchus or lung: Secondary | ICD-10-CM | POA: Diagnosis not present

## 2017-05-07 DIAGNOSIS — C3400 Malignant neoplasm of unspecified main bronchus: Secondary | ICD-10-CM | POA: Diagnosis not present

## 2017-05-07 DIAGNOSIS — C3431 Malignant neoplasm of lower lobe, right bronchus or lung: Secondary | ICD-10-CM | POA: Insufficient documentation

## 2017-05-07 DIAGNOSIS — J439 Emphysema, unspecified: Secondary | ICD-10-CM | POA: Diagnosis not present

## 2017-05-07 LAB — BUN & CREATININE (CHCC)
BUN: 13 mg/dL (ref 7–26)
CREATININE: 0.93 mg/dL (ref 0.70–1.30)
GFR, Est AFR Am: >60 mL/min (ref >60)
GFR, Estimated: >60 mL/min (ref >60)

## 2017-05-07 MED ORDER — IOPAMIDOL (ISOVUE-300) INJECTION 61%
75.0000 mL | Freq: Once | INTRAVENOUS | Status: AC | PRN
  Administered 2017-05-07: 75 mL via INTRAVENOUS

## 2017-05-07 NOTE — Telephone Encounter (Signed)
I spoke with the patient and we reviewed the results of his CT scan which shows new left hilar fullness/lesion measuring 24 x 28 mm compared to 5 mm from prior scan in August 2018. He is counseled on PET scan. We will coordinate this and present his case in thoracic conference following PET. 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 Pearline Cables at [facility]. He's awaiting pathology results. The patient is also being seen for macular degeneration, dry type.

## 2017-05-09 ENCOUNTER — Inpatient Hospital Stay
Admission: RE | Admit: 2017-05-09 | Discharge: 2017-05-09 | Disposition: A | Discharge status: Home / Self Care | Payer: Self-pay | Source: Ambulatory Visit | Attending: Radiation Oncology | Admitting: Radiation Oncology

## 2017-05-17 ENCOUNTER — Other Ambulatory Visit: Payer: Self-pay | Admitting: *Deleted

## 2017-05-17 ENCOUNTER — Ambulatory Visit (HOSPITAL_COMMUNITY)
Admission: RE | Admit: 2017-05-17 | Discharge: 2017-05-17 | Disposition: A | Discharge status: Home / Self Care | Payer: Medicare Other | Source: Ambulatory Visit | Attending: Radiation Oncology | Admitting: Radiation Oncology

## 2017-05-17 ENCOUNTER — Encounter (HOSPITAL_COMMUNITY): Payer: Self-pay

## 2017-05-17 DIAGNOSIS — Z923 Personal history of irradiation: Secondary | ICD-10-CM | POA: Diagnosis not present

## 2017-05-17 DIAGNOSIS — C3402 Malignant neoplasm of left main bronchus: Secondary | ICD-10-CM | POA: Diagnosis not present

## 2017-05-17 DIAGNOSIS — Z902 Acquired absence of lung [part of]: Secondary | ICD-10-CM | POA: Diagnosis not present

## 2017-05-17 DIAGNOSIS — J439 Emphysema, unspecified: Secondary | ICD-10-CM | POA: Insufficient documentation

## 2017-05-17 DIAGNOSIS — I2510 Atherosclerotic heart disease of native coronary artery without angina pectoris: Secondary | ICD-10-CM | POA: Insufficient documentation

## 2017-05-17 DIAGNOSIS — I700 Atherosclerosis of aorta: Secondary | ICD-10-CM | POA: Diagnosis not present

## 2017-05-17 DIAGNOSIS — R911 Solitary pulmonary nodule: Secondary | ICD-10-CM | POA: Insufficient documentation

## 2017-05-17 DIAGNOSIS — C3492 Malignant neoplasm of unspecified part of left bronchus or lung: Secondary | ICD-10-CM | POA: Diagnosis not present

## 2017-05-17 LAB — GLUCOSE, CAPILLARY: GLUCOSE-CAPILLARY: 101 mg/dL — AB (ref 65–99)

## 2017-05-17 MED ORDER — APIXABAN 5 MG PO TABS: 5.0000 mg | ORAL_TABLET | Freq: Two times a day (BID) | ORAL | 11 refills | Status: DC

## 2017-05-17 MED ORDER — FLUDEOXYGLUCOSE F - 18 (FDG) INJECTION
10.7000 | Freq: Once | INTRAVENOUS | Status: AC
  Administered 2017-05-17: 10.7 via INTRAVENOUS

## 2017-05-17 NOTE — Telephone Encounter (Signed)
Eliquis 5mg  refill request received; Pt is 72 yrs old, Wt-94.2kg, Crea-0.93 on 05/07/17, last seen by Dr. Lovena Le on 12/05/16; will send in refill to requested pharmacy.

## 2017-05-18 DIAGNOSIS — E7800 Pure hypercholesterolemia, unspecified: Secondary | ICD-10-CM | POA: Diagnosis not present

## 2017-05-18 DIAGNOSIS — F411 Generalized anxiety disorder: Secondary | ICD-10-CM | POA: Diagnosis not present

## 2017-05-18 DIAGNOSIS — Z79899 Other long term (current) drug therapy: Secondary | ICD-10-CM | POA: Diagnosis not present

## 2017-05-18 DIAGNOSIS — I480 Paroxysmal atrial fibrillation: Secondary | ICD-10-CM | POA: Diagnosis not present

## 2017-05-18 DIAGNOSIS — D696 Thrombocytopenia, unspecified: Secondary | ICD-10-CM | POA: Diagnosis not present

## 2017-05-23 ENCOUNTER — Telehealth: Payer: Self-pay | Admitting: Radiation Oncology

## 2017-05-23 NOTE — Telephone Encounter (Signed)
I LM for the patient to call me back regarding his PET results.

## 2017-05-24 ENCOUNTER — Telehealth: Payer: Self-pay | Admitting: Radiation Oncology

## 2017-05-24 NOTE — Telephone Encounter (Signed)
I spoke with the patient this am and reviewed that we would layer his imaging from treatment with his recent PET scan. We will meet back to review this with [place] on Monday to see if he's a candidate for more XRT versus considering chemotherapy. He is also concerned about his prostate size and reports he hasn't had a PSA level recently. I'll let Dr. Felipa Eth know about this and some of the atherosclerotic changes in his vessels as well as some concerns with PVD in his lower extremities.     Carola Rhine, PAC

## 2017-05-24 NOTE — Progress Notes (Signed)
Benjamin [name]. [place] Multifocal Stage I, NSCLC of the left upper lobe, right upper lobe, left lower lobe, and now of the right lower lobe, review 05-07-17 CT chest w contrast, FU.    Weight changes, if any: Wt Readings from Last 3 Encounters:  05/28/17 209 lb (94.8 kg)  12/05/16 207 lb 9.6 oz (94.2 kg)  11/09/16 210 lb (95.3 kg)   Respiratory complaints, if any: SOB with exertion,coughing with sinus drainage and wheezing Swallowing Problems/Pain/Difficulty swallowing:No Appetite :Good Pain:No BP (!) 156/99 (BP Location: Right Arm, Patient Position: Sitting, Cuff Size: Large)   Pulse 69   Temp 97.8 F (36.6 C) (Oral)   Resp 20   Ht 5\' 11"  (1.803 m)   Wt 209 lb (94.8 kg)   SpO2 98%   BMI 29.15 kg/m

## 2017-05-28 ENCOUNTER — Ambulatory Visit
Admission: RE | Admit: 2017-05-28 | Discharge: 2017-05-28 | Disposition: A | Discharge status: Home / Self Care | Payer: Medicare Other | Source: Ambulatory Visit | Attending: Radiation Oncology | Admitting: Radiation Oncology

## 2017-05-28 ENCOUNTER — Encounter: Payer: Self-pay | Admitting: Radiation Oncology

## 2017-05-28 ENCOUNTER — Other Ambulatory Visit: Payer: Self-pay

## 2017-05-28 VITALS — BP 156/99 | HR 69 | Temp 97.8°F | Resp 20 | Ht 71.0 in | Wt 209.0 lb

## 2017-05-28 DIAGNOSIS — Z87891 Personal history of nicotine dependence: Secondary | ICD-10-CM | POA: Insufficient documentation

## 2017-05-28 DIAGNOSIS — Z803 Family history of malignant neoplasm of breast: Secondary | ICD-10-CM | POA: Insufficient documentation

## 2017-05-28 DIAGNOSIS — Z79899 Other long term (current) drug therapy: Secondary | ICD-10-CM | POA: Insufficient documentation

## 2017-05-28 DIAGNOSIS — C3412 Malignant neoplasm of upper lobe, left bronchus or lung: Secondary | ICD-10-CM | POA: Diagnosis not present

## 2017-05-28 DIAGNOSIS — Z8249 Family history of ischemic heart disease and other diseases of the circulatory system: Secondary | ICD-10-CM | POA: Insufficient documentation

## 2017-05-28 DIAGNOSIS — R0789 Other chest pain: Secondary | ICD-10-CM | POA: Diagnosis not present

## 2017-05-28 DIAGNOSIS — C3400 Malignant neoplasm of unspecified main bronchus: Secondary | ICD-10-CM

## 2017-05-28 DIAGNOSIS — S2239XS Fracture of one rib, unspecified side, sequela: Secondary | ICD-10-CM | POA: Diagnosis not present

## 2017-05-28 DIAGNOSIS — C3431 Malignant neoplasm of lower lobe, right bronchus or lung: Secondary | ICD-10-CM | POA: Diagnosis not present

## 2017-05-28 DIAGNOSIS — Z801 Family history of malignant neoplasm of trachea, bronchus and lung: Secondary | ICD-10-CM | POA: Insufficient documentation

## 2017-05-28 DIAGNOSIS — Z85118 Personal history of other malignant neoplasm of bronchus and lung: Secondary | ICD-10-CM | POA: Diagnosis not present

## 2017-05-28 DIAGNOSIS — Z923 Personal history of irradiation: Secondary | ICD-10-CM | POA: Insufficient documentation

## 2017-05-28 DIAGNOSIS — Z885 Allergy status to narcotic agent status: Secondary | ICD-10-CM | POA: Diagnosis not present

## 2017-05-28 NOTE — Progress Notes (Signed)
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   Interval Since Last Radiation:  2 years, 9 months  09/20/15, 09/23/15, 09/27/15: SBRT to the right lower lobe was treated to 54 Gy in 3 fractions of 18 Gy.  06/03/07- 07/05/07: 62.5 Gy in 25 fractions of 2.5 Gy to an inferior left upper lobe target  Narrative:  Benjamin [name] is a pleasant 72 y.o. gentleman with a history of multifocal Stage I NSCLC, of the right and left upper lobes. He has previously undergone resection of the left upper lobe in 2003, as well as a resection of the right upper lobe in 2005,and does have some chronic issues with breathing as a result of lower lung volume. He completed his most recent course of SBRT in July 2017 to the right lower lobe, and has been followed in surveillance. He was doing well however was found to have a lesion in the left lower lobe as well as a spiculated right lower lobe nodule on his last CT on 05/07/17. A PET scan on 05/17/17 revealed a 2 x 1.5 cm mass with an SUV of 22.5 along the staple line from his prior lobectomy. He also has a 10 mm nodule with an SUV of 2.9 in the right lower lobe. His case [facility] conference and would benefit from SBRT style treatment to the lung versus systemic therapy. He is not a candidate for additional biopsy at this time per our discussion given his anatomy. He comes today to discuss options of radiotherapy.   On review of systems, the patient reports that he is doing well overall. He denies any chest pain, shortness of breath, cough, fevers, chills, night sweats, unintended weight changes. He denies any bowel or bladder disturbances, and  denies abdominal pain, nausea or vomiting. He denies any new musculoskeletal or joint aches or pains, new skin lesions or concerns. A complete review of systems is obtained and is otherwise negative.      Past Medical History:  Past Medical History:  Diagnosis Date  . Adenopathy    RIGHT ADRENAL  . Anxiety   . Arthritis    KNEES/HANDS  . CAD (coronary artery disease)    a. Prior known CTO of LCX with recanalizationby cath 2008;  b. 09/2012 NSTEMI/Cath/PCI: LM nl, LAD 51m, D1 50p, LCX 100p (2.75x16 Promus Premier DES), 80-68m, OM1 30, RCA 20m (3.0x16 Promus Premier DES), EF 50-55%.  [name] History of echocardiogram    Echo 2/18: Moderate LVH, EF 55-60, normal wall motion, trivial AI, moderate LAE, mild RVE, mild RAE, mildly elevated pulmonary pressure  . Hx of melanoma of skin   . 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 Non-small cell lung cancer ([place])    a. s/p resection and XRT.  [name] PAF (paroxysmal atrial fibrillation) ([place])    a. not currently on anticoagulation (09/2012)  . 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 ADRENALEXTOMY  02/2000  . CARDIAC CATHETERIZATION  2008   LAD 40, D1 OK, D2 50, CFX 90, RCA OK, EF 60%;  Consider PCI PRN but the stenosis is quite complex and we would end up jailing several large posterolateral branches    . LEFT HEART CATHETERIZATION WITH CORONARY ANGIOGRAM N/A 10/17/2012   Procedure: LEFT HEART CATHETERIZATION WITH CORONARY ANGIOGRAM;  Surgeon: Sherren Mocha, MD;  Location: [facility] CATH LAB;  Service: Cardiovascular;  Laterality: N/A;  . LOBECTOMY LEFT UPPER LOBE  08/15/2001  . PACEMAKER IMPLANT N/A 08/29/2016   Procedure: Pacemaker Implant;  Surgeon: Evans Lance, MD;  Location: [facility] CV LAB;  Service: Cardiovascular;  Laterality: N/A;  . Right VATS, mini thoracotomy, wedge resection of right upper  01/18/2004    Social History:  Social History    Socioeconomic History  . Marital status: Divorced    Spouse name: Not on file  . Number of children: Not on file  . Years of education: Not on file  . Highest education level: Not on file  Social Needs  . Financial resource strain: Not on file  . Food insecurity - worry: Not on file  . Food insecurity - inability: Not on file  . Transportation needs - medical: Not on file  . Transportation needs - non-medical: Not on file  Occupational History  . Occupation: Retired    Fish farm manager: [employer]  Tobacco Use  . Smoking status: Former Smoker    Packs/day: 1.00    Years: 40.00    Pack years: 40.00    Types: Cigarettes    Last attempt to quit: 08/15/2001    Years since quitting: 15.7  . Smokeless tobacco: Never Used  Substance and Sexual Activity  . Alcohol use: No  . Drug use: No  . Sexual activity: Not Currently  Other Topics Concern  . Not on file  Social History Narrative  . Not on file    Family History: Family History  Problem Relation Age of Onset  . Heart attack Father   . Cancer Mother        lung  . Cancer Cousin        breast    ALLERGIES:  is allergic to codeine.  Meds: Current Outpatient Medications  Medication Sig Dispense Refill  . apixaban (ELIQUIS) 5 MG TABS tablet Take 1 tablet (5 mg total) by mouth 2 (two) times daily. 60 tablet 11  . atorvastatin (LIPITOR) 20 MG tablet Take 20 mg by mouth every evening.     . clonazePAM (KLONOPIN) 1 MG tablet Take 1 mg by mouth 3 (three) times daily. 8am, 2pm, 8 pm    . metoprolol succinate (TOPROL XL) 25 MG 24 hr tablet Take 1 tablet (25 mg total) by mouth daily. 30 tablet 8  . gabapentin (NEURONTIN) 300 MG capsule Take 1 capsule (300 mg total) by mouth 3 (three) times daily. (Patient not taking: Reported on 05/28/2017) 90 capsule 6  . nitroGLYCERIN (NITROSTAT) 0.4 MG SL tablet Place 1 tablet (0.4 mg total) under the tongue every 5 (five) minutes x 3 doses as needed for chest pain. (Patient not taking:  Reported on 05/28/2017) 25 tablet 3   No current facility-administered medications for this encounter.     Physical Findings:  height is 5\' 11"  (1.803 m) and weight is 209 lb (94.8 kg). His oral temperature is 97.8 F (36.6 C). His blood pressure is 156/99 (abnormal) and his pulse is 69. His respiration is 20 and oxygen saturation is 98%.   Pain Assessment Pain Score: 0-No pain/10 In general this is a well appearing caucasian male in no acute distress. He is  alert and oriented x4 and appropriate throughout the examination. HEENT reveals that the patient is normocephalic, atraumatic. EOMs are intact. PERRLA. Skin is intact without any evidence of gross lesions.  Cardiopulmonary assessment is negative for acute distress and he exhibits normal effort.     Lab Findings: Lab Results  Component Value Date   WBC 9.6 08/23/2016   HGB 15.4 08/23/2016   HCT 46.6 08/23/2016   MCV 88 08/23/2016   PLT 141 (L) 08/23/2016     Radiographic Findings: Ct Chest W Contrast  Result Date: 05/07/2017 CLINICAL DATA:  Recurrent lung cancer. EXAM: CT CHEST WITH CONTRAST TECHNIQUE: Multidetector CT imaging of the chest was performed during intravenous contrast administration. CONTRAST:  51mL ISOVUE-300 IOPAMIDOL (ISOVUE-300) INJECTION 61% COMPARISON:  11/08/2016. FINDINGS: Cardiovascular: Atherosclerotic calcification of the arterial vasculature, including coronary arteries. Heart is mildly enlarged. No pericardial effusion. Extensive collateral vascular flow secondary to left subclavian vein stenosis. Mediastinum/Nodes: No pathologically enlarged mediastinal, hilar or axillary lymph nodes. Esophagus is grossly unremarkable. Lungs/Pleura: Emphysema. Scarring and volume loss in the right upper lobe. An 8 mm spiculated nodule in the superior segment right lower lobe retracts the adjacent major fissure and measures slightly larger, previously 6 mm. Mixed solid and ground-glass lesion in the lateral segment right middle  lobe has a nodular component measuring 11 mm, as before. Nodular soft tissue with surrounding retraction in the right lower lobe (series 8, image 102) is difficult to measure but appears grossly stable. Left upper lobectomy. Rounded consolidation in the posterolateral periphery of the left lower lobe is unchanged. However, there is a central nodular soft tissue component in the left suprahilar region, measuring 2.5 x 2.9 cm (image 58), new or enlarged from 5 mm. No pleural fluid. Airway is otherwise unremarkable. Upper Abdomen: Subcentimeter low-attenuation lesion in the left hepatic lobe is too small to characterize. Visualized portions of the liver, gallbladder, left adrenal gland, kidneys, spleen, pancreas, stomach and bowel are otherwise grossly unremarkable the exception of a small hiatal hernia. Right adrenalectomy. No pathologically enlarged lymph nodes. Musculoskeletal: Degenerative changes in the spine. No worrisome lytic or sclerotic lesions. IMPRESSION: 1. New or enlarging nodular soft tissue in the left perihilar region, worrisome for disease recurrence. 2. Spiculated right lower lobe nodule appears minimally larger. 3. Rounded subpleural soft tissue in the posterolateral left hemithorax, stable, as are additional part solid nodular lesions in the right middle lobe and right lower lobe. 4. Aortic atherosclerosis (ICD10-170.0). Coronary artery calcification. 5.  Emphysema (ICD10-J43.9). Electronically Signed   By: Lorin Picket M.D.   On: 05/07/2017 13:52   Nm Pet Image Restag (ps) Skull Base To Thigh  Result Date: 05/17/2017 CLINICAL DATA:  Subsequent treatment strategy for left lung cancer. EXAM: NUCLEAR MEDICINE PET SKULL BASE TO THIGH TECHNIQUE: 10.7 mCi F-18 FDG was injected intravenously. Full-ring PET imaging was performed from the skull base to thigh after the radiotracer. CT data was obtained and used for attenuation correction and anatomic localization. Fasting blood glucose: 101 mg/dl  Mediastinal blood pool activity: SUV max 2.4 COMPARISON:  Multiple exams, including 08/23/2015 FINDINGS: NECK: No hypermetabolic adenopathy in the neck. Incidental CT findings: Mild bilateral carotid atherosclerotic calcification. CHEST: Left upper lobectomy. New (compared to prior PET-CT) nodularity along the staple line adjacent to the left pulmonary artery, measuring 2.0 by 1.5 cm, with maximum SUV of 22.5, compatible with recurrence. Band of peripheral consolidation in the left lung may reflect prior radiation therapy, maximum SUV 3.2, previously 4.3. A previously sub solid superior segment right  lower lobe nodule no has a solid appearance and measures 10 mm in diameter. Maximum SUV 2.9, previously 1.2. Wedge resections staple line in the right upper lobe. Bandlike density in the right lower lobe with a potential 2.8 by 1.0 cm nodular component at the site of the prior ground-glass opacity. Maximum SUV in this vicinity 1.6. Incidental CT findings: Left pacer noted. Coronary, aortic arch, and branch vessel atherosclerotic vascular disease. Upper normal heart size. Old granulomatous disease. Emphysema. ABDOMEN/PELVIS: No abnormal hypermetabolic lesion of the liver, spleen, left adrenal gland, or pancreas. Right adrenalectomy. No hypermetabolic adenopathy in the abdomen. Incidental CT findings: Aortoiliac atherosclerotic vascular disease. Borderline appearance for constipation. Moderate prostatomegaly without hypermetabolic activity in the prostate. SKELETON: Low-grade hypermetabolic activity along a healing right fifth rib fracture fracture laterally. No findings worrisome for osseous metastatic disease. Incidental CT findings: Severe arthropathy of the right hip with flattening of the femoral head and a potential component of dysplasia. IMPRESSION: 1. Recurrence along the staple line adjacent to the left pulmonary artery, with a 2.0 by 1.5 cm nodule having a maximum SUV of 22.5. 2. The enlarging superior segment  right lower lobe nodule measures 10 mm in diameter and has a maximum SUV of 2.9 (previously 1.2), suspicious for malignancy. 3. Low-grade and decreasing metabolic activity in the peripheral wedge-shaped density in the left upper chest, possibly from prior radiation therapy. 4. Increased density in the right lower lobe at the site of the prior ground-glass opacity, maximum SUV 1.6. The patient receive interval radiation therapy? Surveillance suggested. 5. Prior left upper lobectomy and prior wedge resection in the right upper lobe. 6. Other imaging findings of potential clinical significance: Aortic Atherosclerosis (ICD10-I70.0) and Emphysema (ICD10-J43.9). Coronary atherosclerosis. Borderline appearance for constipation. Prostatomegaly. Severe arthropathy of the right hip with degenerative and possibly dysplastic components. Late phase healing fracture the right fifth rib laterally. Electronically Signed   By: Van Clines M.D.   On: 05/17/2017 14:45    Impression/Plan: 1. Multiple Stage I, NSCLC of the left upper lobe, right upper lobe, left lower lobe, and right lower lobe. We reviewed his imaging studies and we recommend a course of Stereotactic body style radiotherapy to the RLL in 3 fractions and to the LUL in 10 fractions.  We discussed the risks, benefits, short, and long term effects of radiotherapy, and the patient is interested in proceeding. We will contact him for simulation appointments and subsequent treatment. I will also reach out to pathology to see if they can run additional molecular studies on his specimen from 2017. 2. Right chest wall pain with radiographic changes consistent with rib fracture. The patient's rib fracture is still seen on imaging, but appears to be improved clinically. We will follow this expectantly.  In a visit lasting 25 minutes, greater than 50% of the time was spent face to face discussing his films history, and coordinating the patient's care.     Carola Rhine, PAC and _____________________________________  Sheral Apley Tammi Klippel, M.D.

## 2017-05-30 ENCOUNTER — Encounter: Payer: Self-pay | Admitting: *Deleted

## 2017-05-30 ENCOUNTER — Telehealth: Payer: Self-pay | Admitting: Radiation Oncology

## 2017-05-30 DIAGNOSIS — Z125 Encounter for screening for malignant neoplasm of prostate: Secondary | ICD-10-CM | POA: Diagnosis not present

## 2017-05-30 DIAGNOSIS — Z0000 Encounter for general adult medical examination without abnormal findings: Secondary | ICD-10-CM | POA: Diagnosis not present

## 2017-05-30 NOTE — Telephone Encounter (Signed)
Phoned patient as requested by [name], PA-C. Explained to the patient that Dr. Tammi Klippel plans to "run the test they discussed but it will take two weeks to get the results in." Explained that because it takes two weeks to get the results in his simulation will need to be rescheduled to the week of 06/11/2017. Explained to the patient our simulation staff will phone him with this appointment. Patient verbalized understanding. Patient requested this RN tell Bryson Ha that he was seen by Dr. Felipa Eth today and blood work was drawn. Patient states, "Dr. Felipa Eth will send her my PSA results.'

## 2017-05-30 NOTE — Progress Notes (Signed)
[name] with Benjamin [name] made aware that Benjamin [name]  spoke with Dr. Tresa Moore in pathology and he's going to look at the specimen from 08/2015 and if there is enough tissue to send for molecular studies he is going to send it off. We'll plan for that, and if there is not enough tissue we will let you know!  He is fine with this plan. He reported that he is getting a cold coughing some he mentioned it to [name] this morning when he called he does not have a fever.

## 2017-05-31 ENCOUNTER — Ambulatory Visit: Payer: Medicare Other | Admitting: Radiation Oncology

## 2017-05-31 ENCOUNTER — Encounter: Payer: Self-pay | Admitting: *Deleted

## 2017-05-31 DIAGNOSIS — C3400 Malignant neoplasm of unspecified main bronchus: Secondary | ICD-10-CM | POA: Diagnosis not present

## 2017-05-31 NOTE — Progress Notes (Signed)
[place] with Mr. Boardley gave PSA result of 2.77 mentioned that if he has urinary difficulty like weakness of stream, incomplete sense of emptying, or frequent urination Dr. Felipa Eth can help manage that, or send him to a urologist to discuss treatment.  If he has no symptoms, his PSA can just be followed annually.

## 2017-05-31 NOTE — Progress Notes (Signed)
1100 Mr. Vallone called back and "stated he saw Dr. Lajean Manes yesterday had a PSA drawn and Dr. Felipa Eth' office will have the PSA result faxed to [name], PA-C.

## 2017-05-31 NOTE — Progress Notes (Signed)
[place] with Mr. Radu made him aware that his specimen from 08/2015 was sent off this morning and the simulation department will be calling him to schedule an appointment for June 19, 2017.  He is okay with this plan.

## 2017-06-04 ENCOUNTER — Telehealth: Payer: Self-pay | Admitting: Radiation Oncology

## 2017-06-04 NOTE — Telephone Encounter (Signed)
Received voicemail message late Friday from [place]. She called to inquire of patient's cancer stage and if he has had a recurrence. Phoned her back at the number she provided. No answer. Left detailed message with the information she request as well as my contact information for future questions.

## 2017-06-05 ENCOUNTER — Ambulatory Visit (INDEPENDENT_AMBULATORY_CARE_PROVIDER_SITE_OTHER): Payer: Medicare Other | Admitting: *Deleted

## 2017-06-05 ENCOUNTER — Ambulatory Visit (INDEPENDENT_AMBULATORY_CARE_PROVIDER_SITE_OTHER): Payer: Self-pay | Admitting: *Deleted

## 2017-06-05 DIAGNOSIS — I442 Atrioventricular block, complete: Secondary | ICD-10-CM

## 2017-06-05 DIAGNOSIS — I482 Chronic atrial fibrillation, unspecified: Secondary | ICD-10-CM

## 2017-06-05 NOTE — Progress Notes (Signed)
Patient presented to clinic for scheduled PPM check.  He is unable to connect his Carelink monitor at home due to poor cellular signal.  Patient able to transmit from our office using his home monitor.  Remote appointment added once patient's transmission was successfully received.  See processed remote transmission report for details on PPM function.  ROV with Dr. Lovena Le on 09/04/17.  Reached out to [employer] for assistance determining if a 4G-capable Carelink monitor would work at patient's home.  Patient will plan to call our office with any questions or concerns prior to next appointment with Dr. Lovena Le.

## 2017-06-05 NOTE — Progress Notes (Signed)
Remote pacemaker transmission.   

## 2017-06-06 ENCOUNTER — Telehealth: Payer: Self-pay | Admitting: *Deleted

## 2017-06-06 NOTE — Telephone Encounter (Signed)
Patient called because he plugged his monitor in yesterday afternoon and eventually got an orange error screen.  He is unsure what the error code said.  Encouraged patient to discuss with [employer] when they contact him this week.  Also advised that if he wishes to try plugging monitor in again, I can attempt to help him troubleshoot it.  Patient is agreeable and will plan to call back for help if he decides to plug monitor back in.  He is appreciative and denies additional questions or concerns at this time.

## 2017-06-13 DIAGNOSIS — C3400 Malignant neoplasm of unspecified main bronchus: Secondary | ICD-10-CM | POA: Diagnosis not present

## 2017-06-14 ENCOUNTER — Encounter: Payer: Self-pay | Admitting: *Deleted

## 2017-06-14 ENCOUNTER — Telehealth: Payer: Self-pay | Admitting: Radiation Oncology

## 2017-06-14 NOTE — Progress Notes (Signed)
102 Spoke with Maudie Mercury in pathology at [facility] the molecular studies tissue was send out May 31, 2017 to [place] from [place] it usually takes about 7 to 14 days to receive the results they should be back by the first of next week.

## 2017-06-14 NOTE — Progress Notes (Signed)
[place] a message that his molecular studies were sent off 05-31-17 and the results should be back by the first of next of next week and we will give you a call back with the results.  Calling you from Dr. Alfredo Bach office and [name], PA-C at the Cancer center.

## 2017-06-14 NOTE — Telephone Encounter (Signed)
We're still awaiting results of foundation 1 and PDL 1 testing

## 2017-06-15 ENCOUNTER — Encounter (HOSPITAL_COMMUNITY): Payer: Self-pay | Admitting: Radiation Oncology

## 2017-06-17 NOTE — Progress Notes (Signed)
  Radiation Oncology         [phone]) [phone] ________________________________  Name: Benjamin [name] MRN: [id number]  Date: 06/18/2017  DOB: Jan 31, 1946  STEREOTACTIC BODY RADIOTHERAPY SIMULATION AND TREATMENT PLANNING NOTE    ICD-10-CM   1. 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  Identity was confirmed.  All relevant records and images related to the planned course of therapy were reviewed.  The patient freely provided informed written consent to proceed with treatment after reviewing the details related to the planned course of therapy. The consent form was witnessed and verified by the simulation staff.  Then, the patient was set-up in a stable reproducible  supine position for radiation therapy.  A BodyFix immobilization pillow was fabricated for reproducible positioning.  Then I personally applied the abdominal compression paddle to limit respiratory excursion.  4D respiratoy motion management CT images were obtained.  Surface markings were placed.  The CT images were loaded into the planning software.  Then, using Cine, MIP, and standard views, the internal target volume (ITV) and planning target volumes (PTV) were delinieated, and avoidance structures were contoured.  Treatment planning then occurred.  The radiation prescription was entered and confirmed.  A total of two complex treatment devices were fabricated in the form of the BodyFix immobilization pillow and a neck accuform cushion.  I have requested : 3D Simulation  I have requested a DVH of the following structures: Heart, Lungs, Esophagus, Chest Wall, Brachial Plexus, Major Blood Vessels, and targets.  SPECIAL TREATMENT PROCEDURE:  The planned course of therapy using radiation  constitutes a special treatment procedure. Special care is required in the management of this patient for the following reasons. This treatment constitutes a Special Treatment Procedure for the following reason: [ High dose per fraction requiring special monitoring for increased toxicities of treatment including daily imaging..  The special nature of the planned course of radiotherapy will require increased physician supervision and oversight to ensure patient's safety with optimal treatment outcomes.  RESPIRATORY MOTION MANAGEMENT SIMULATION:  In order to account for effect of respiratory motion on target structures and other organs in the planning and delivery of radiotherapy, this patient underwent respiratory motion management simulation.  To accomplish this, when the patient was brought to the CT simulation planning suite, 4D respiratoy motion management CT images were obtained.  The CT images were loaded into the planning software.  Then, using a variety of tools including Cine, MIP, and standard views, the target volume and planning target volumes (PTV) were delineated.  Avoidance structures were contoured.  Treatment planning then occurred.  Dose volume histograms were generated and reviewed for each of the requested structure.  The resulting plan was carefully reviewed and approved today.  PLAN:  The patient will receive 50 Gy in 5-10 fractions to the staple line lesion and 54 Gy in 3 fractions to the RLL lesion.  ________________________________  Sheral Apley Tammi Klippel, M.D.

## 2017-06-18 ENCOUNTER — Ambulatory Visit
Admission: RE | Admit: 2017-06-18 | Discharge: 2017-06-18 | Disposition: A | Discharge status: Home / Self Care | Payer: Medicare Other | Source: Ambulatory Visit | Attending: Oncology | Admitting: Oncology

## 2017-06-18 DIAGNOSIS — Z510 Encounter for antineoplastic radiation therapy: Secondary | ICD-10-CM | POA: Insufficient documentation

## 2017-06-18 DIAGNOSIS — C3431 Malignant neoplasm of lower lobe, right bronchus or lung: Secondary | ICD-10-CM | POA: Insufficient documentation

## 2017-06-20 ENCOUNTER — Encounter: Payer: Self-pay | Admitting: Internal Medicine

## 2017-06-22 LAB — CUP PACEART REMOTE DEVICE CHECK
Battery Remaining Longevity: 121 mo
Brady Statistic AP VP Percent: 0.01 %
Brady Statistic RA Percent Paced: 92.94 %
Brady Statistic RV Percent Paced: 0.01 %
Date Time Interrogation Session: 20190319184601
Implantable Lead Implant Date: 20180612
Implantable Lead Location: 753860
Implantable Lead Model: 3830
Implantable Lead Model: 5076
Lead Channel Impedance Value: 304 Ohm
Lead Channel Impedance Value: 418 Ohm
Lead Channel Sensing Intrinsic Amplitude: 3 mV
Lead Channel Sensing Intrinsic Amplitude: 3 mV
Lead Channel Setting Pacing Amplitude: 2 V
Lead Channel Setting Pacing Pulse Width: 0.4 ms
MDC IDC LEAD IMPLANT DT: 20180612
MDC IDC LEAD LOCATION: 753860
MDC IDC MSMT BATTERY VOLTAGE: 3.04 V
MDC IDC MSMT LEADCHNL RA IMPEDANCE VALUE: 437 Ohm
MDC IDC MSMT LEADCHNL RV IMPEDANCE VALUE: 494 Ohm
MDC IDC MSMT LEADCHNL RV SENSING INTR AMPL: 19.75 mV
MDC IDC MSMT LEADCHNL RV SENSING INTR AMPL: 19.75 mV
MDC IDC PG IMPLANT DT: 20180612
MDC IDC SET LEADCHNL RA PACING AMPLITUDE: 2 V
MDC IDC SET LEADCHNL RV SENSING SENSITIVITY: 0.9 mV
MDC IDC STAT BRADY AP VS PERCENT: 93.26 %
MDC IDC STAT BRADY AS VP PERCENT: 0 %
MDC IDC STAT BRADY AS VS PERCENT: 6.74 %

## 2017-06-25 ENCOUNTER — Telehealth: Payer: Self-pay | Admitting: Radiation Oncology

## 2017-06-25 NOTE — Telephone Encounter (Signed)
LM for pt to call me back to discuss recommendations for treatment.

## 2017-06-26 DIAGNOSIS — C3431 Malignant neoplasm of lower lobe, right bronchus or lung: Secondary | ICD-10-CM | POA: Diagnosis not present

## 2017-06-26 DIAGNOSIS — Z510 Encounter for antineoplastic radiation therapy: Secondary | ICD-10-CM | POA: Diagnosis not present

## 2017-06-27 ENCOUNTER — Ambulatory Visit
Admission: RE | Admit: 2017-06-27 | Discharge: 2017-06-27 | Disposition: A | Discharge status: Home / Self Care | Payer: Medicare Other | Source: Ambulatory Visit | Attending: Radiation Oncology | Admitting: Radiation Oncology

## 2017-06-27 ENCOUNTER — Telehealth: Payer: Self-pay | Admitting: Radiation Oncology

## 2017-06-27 DIAGNOSIS — Z510 Encounter for antineoplastic radiation therapy: Secondary | ICD-10-CM | POA: Diagnosis not present

## 2017-06-27 DIAGNOSIS — C3431 Malignant neoplasm of lower lobe, right bronchus or lung: Secondary | ICD-10-CM | POA: Diagnosis not present

## 2017-06-27 NOTE — Telephone Encounter (Signed)
I spoke with the patient and he's in the process of getting started with SBRT to bilateral lung lesions. He will complete this course and follow up in 1 month for post treatment evaluation.

## 2017-06-28 ENCOUNTER — Ambulatory Visit: Payer: Medicare Other

## 2017-06-29 ENCOUNTER — Ambulatory Visit
Admission: RE | Admit: 2017-06-29 | Discharge: 2017-06-29 | Disposition: A | Discharge status: Home / Self Care | Payer: Medicare Other | Source: Ambulatory Visit | Attending: Radiation Oncology | Admitting: Radiation Oncology

## 2017-06-29 DIAGNOSIS — C3431 Malignant neoplasm of lower lobe, right bronchus or lung: Secondary | ICD-10-CM | POA: Diagnosis not present

## 2017-06-29 DIAGNOSIS — Z510 Encounter for antineoplastic radiation therapy: Secondary | ICD-10-CM | POA: Diagnosis not present

## 2017-07-02 ENCOUNTER — Ambulatory Visit
Admission: RE | Admit: 2017-07-02 | Discharge: 2017-07-02 | Disposition: A | Discharge status: Home / Self Care | Payer: Medicare Other | Source: Ambulatory Visit | Attending: Radiation Oncology | Admitting: Radiation Oncology

## 2017-07-02 DIAGNOSIS — Z510 Encounter for antineoplastic radiation therapy: Secondary | ICD-10-CM | POA: Diagnosis not present

## 2017-07-02 DIAGNOSIS — C3431 Malignant neoplasm of lower lobe, right bronchus or lung: Secondary | ICD-10-CM | POA: Diagnosis not present

## 2017-07-03 ENCOUNTER — Ambulatory Visit: Payer: Medicare Other

## 2017-07-04 ENCOUNTER — Ambulatory Visit: Payer: Medicare Other

## 2017-07-04 ENCOUNTER — Ambulatory Visit
Admission: RE | Admit: 2017-07-04 | Discharge: 2017-07-04 | Disposition: A | Discharge status: Home / Self Care | Payer: Medicare Other | Source: Ambulatory Visit | Attending: Radiation Oncology | Admitting: Radiation Oncology

## 2017-07-04 DIAGNOSIS — C3431 Malignant neoplasm of lower lobe, right bronchus or lung: Secondary | ICD-10-CM | POA: Diagnosis not present

## 2017-07-04 DIAGNOSIS — Z510 Encounter for antineoplastic radiation therapy: Secondary | ICD-10-CM | POA: Diagnosis not present

## 2017-07-05 ENCOUNTER — Ambulatory Visit: Payer: Medicare Other

## 2017-07-06 ENCOUNTER — Ambulatory Visit
Admission: RE | Admit: 2017-07-06 | Discharge: 2017-07-06 | Disposition: A | Discharge status: Home / Self Care | Payer: Medicare Other | Source: Ambulatory Visit | Attending: Radiation Oncology | Admitting: Radiation Oncology

## 2017-07-06 ENCOUNTER — Encounter: Payer: Self-pay | Admitting: Radiation Oncology

## 2017-07-06 ENCOUNTER — Ambulatory Visit: Payer: Medicare Other

## 2017-07-06 DIAGNOSIS — C3431 Malignant neoplasm of lower lobe, right bronchus or lung: Secondary | ICD-10-CM | POA: Diagnosis not present

## 2017-07-06 DIAGNOSIS — Z510 Encounter for antineoplastic radiation therapy: Secondary | ICD-10-CM | POA: Diagnosis not present

## 2017-07-06 NOTE — Progress Notes (Signed)
  Radiation Oncology         [phone] ________________________________  Name: JIANNI BATTEN MRN: [id number]  Date: 07/06/2017  DOB: March 09, 1946  End of Treatment Note  Diagnosis:   72 y.o. male with multifocal Stage I NSCLC at staple line of left upper lobectomy from 2003 and a new lesion in the right lower lobe  Indication for treatment:  Curative, Definitive SBRT       Radiation treatment dates:   06/27/2017, 06/29/2017, 07/02/2017, 07/04/2017, 07/06/2017  Site/dose:   The staple line lesion in the left lung was treated to 50 Gy in 5 fractions of 10 Gy.  The RLL lesion was treated to 54 Gy in 3 fractions of 18 Gy.   Beams/energy:   The patient was treated using stereotactic body radiotherapy according to a 3D conformal radiotherapy plan.  Volumetric arc fields were employed to deliver 6 MV X-rays.  Image guidance was performed with per fraction cone beam CT prior to treatment under personal MD supervision.  Immobilization was achieved using BodyFix Pillow.  Narrative: The patient tolerated radiation treatment relatively well.  He experienced mild fatigue and a persistent dry cough but otherwise denied any swallowing issues or skin changes.   Plan: The patient has completed radiation treatment. The patient will return to radiation oncology clinic for routine followup in one month. I advised them to call or return sooner if they have any questions or concerns related to their recovery or treatment. ________________________________  Sheral Apley. Tammi Klippel, M.D.  This document serves as a record of services personally performed by Tyler Pita, MD. It was created on his behalf by Rae Lips, a trained medical scribe. The creation of this record is based on the scribe's personal observations and the provider's statements to them. This document has been checked and approved by the attending provider.

## 2017-07-09 ENCOUNTER — Telehealth: Payer: Self-pay | Admitting: *Deleted

## 2017-07-09 ENCOUNTER — Telehealth: Payer: Self-pay | Admitting: Cardiovascular Disease

## 2017-07-09 ENCOUNTER — Ambulatory Visit: Payer: Medicare Other

## 2017-07-09 ENCOUNTER — Telehealth: Payer: Self-pay | Admitting: Internal Medicine

## 2017-07-09 NOTE — Telephone Encounter (Signed)
CALLED PATIENT TO INFORM OF FU APPT. ON 08-08-17 @ 2:30 PM WITH ALISON PERKINS, SPOKE WITH PATIENT AND HE IS AWARE OF THIS APPT. CHANGE  HE IS GOOD WITH IT

## 2017-07-09 NOTE — Telephone Encounter (Signed)
New message:     Pt is calling to see if he still needs appt with Lovena Le if he came in for a check in march for a device check due to his home being able to give a signal to do home checks.

## 2017-07-09 NOTE — Telephone Encounter (Signed)
Spoke with pt informed him that was a recall letter for schedule an apt with Dr. Lovena Le in June pt agreeable to apt at 10:15 09/14/17

## 2017-07-09 NOTE — Telephone Encounter (Signed)
New Message   Pt returning call

## 2017-07-09 NOTE — Telephone Encounter (Signed)
Attempted both home and cell phone numbers no answer.

## 2017-07-10 ENCOUNTER — Ambulatory Visit: Payer: Medicare Other

## 2017-07-17 ENCOUNTER — Encounter

## 2017-07-17 ENCOUNTER — Encounter: Payer: Self-pay | Admitting: Cardiovascular Disease

## 2017-07-17 ENCOUNTER — Ambulatory Visit (INDEPENDENT_AMBULATORY_CARE_PROVIDER_SITE_OTHER): Payer: Medicare Other | Admitting: Cardiovascular Disease

## 2017-07-17 VITALS — BP 142/84 | HR 70 | Ht 71.0 in | Wt 207.8 lb

## 2017-07-17 DIAGNOSIS — I481 Persistent atrial fibrillation: Secondary | ICD-10-CM

## 2017-07-17 DIAGNOSIS — I4819 Other persistent atrial fibrillation: Secondary | ICD-10-CM

## 2017-07-17 DIAGNOSIS — I2510 Atherosclerotic heart disease of native coronary artery without angina pectoris: Secondary | ICD-10-CM | POA: Diagnosis not present

## 2017-07-17 NOTE — Progress Notes (Signed)
Benjamin [name] Date of Birth: 1945/08/11 Medical Record #[id number]   Problem List: 1. CAD - DES to RCA and prox LCx.  (10/16/12) 2. Paroxysmal Atrial fib: 3. Non-small cell lung ca. 4. Leg edema 5.  Pacer - complete heart block    Has CAD with known disease and prior LCX occlusion per cath in 2001 - follow up cath in 2008 showed recanalization of the lCX and otherwise nonobstructive disease, PAF, anxiety, mobitz type 1 second degree AV block - not on beta blocker therapy. Has also had lung cancer in the past treated with surgery and XRT.   Has had recent NSTEMI and had PCI/DES of the RCA and LCX. EF is 50 to 55% per cath. Was noted to have PAF during this admission - not currently on anticoagulation in the setting of dual antiplatelet therapy but this need to be addressed in the future. His CHADSVASc = 2.   Comes in today. Here with his wife. He is doing ok. Has been home about 5 weeks. Had a little chest discomfort initially - none over the past 3 weeks - no NTG use. Feels ok. Mostly limited by his breathing due to his lung cancer history/treatment. No bleeding or bruising. Not fasting today.   Nov. 3, 2014:  Jul 30, 2013:  Benjamin [name] is doing ok.  Has had some anxiety . Rare CP. Typically at rest.  Mild arm discomfort with walking .  He has had lots of lung surgeries ( non-small cell CA) and is limited by dyspnea.    August 20, 2013:  Benjamin [name] is doing well.  His left arm pain has improved.  His myoview was intermediate risk. - he has a scar in the Lcx distribution with possible periinfarct ischimia.  He had significant uptake in the bowel which made interpretation of the myoview difficult   He is chronically short of breath from his multiple lung surgeries. He really is not having any symptoms of angina.  Dec. 15, 2015:  Benjamin [name] is a 72 yo who I follow for CAD, paroxysmal A-fib, lung cancer - s/p multiple surgeries,  His HR is slow but no episodes of syncope or presyncope Has some ankle   swelling at the end of the day.  He avoids salt.    September 14, 2014:  Doing well from a cardiac standpoint . Has some chronic ankle  edema .  Resolves during night  Has had chronic right ankle edema for years   Jan. 30, 2017:  Benjamin [name] is doing OK . is back in atrial fib - asymptomatic.   Has lung cancer.   Recent reports have been good .   Has had a slow HR for years   October 07, 2015:  Benjamin [name] is seen today for follow up . Has had XRT for recurrent  lung ca.    PET scan did not show any meds.  Feeling ok  Had a complication from a lung biopsy - pneumothorax , Had a chest tube placed .  Complicated by subcutaneous emphysemia  Is more short of breath.   July 17, 2016:  Benjamin [name] is seen today for follow up of his coronary artery disease and atrial fibrillation. He is been started on Eliquis . He seems to be doing well.  No bleeding since being on Eliquis .  His lung cancer seem to be stable.  We discussed his bradycardia He gets lightheaded. Near syncope with walking , has weak spell No syncope   Has had some  leg swelling .   Aug. 17, 2018: Benjamin [name] is seen back today  Has developed gout  ( likely due to lasix ) , better with colchicine   Breathing is unchanged. No hemoptysis   April 30 , 2019:  Benjamin [name] is seen back today for follow-up of his coronary artery disease and paroxysmal atrial fibrillation. Has been diagnosed with dry Macular degeneration Finished XRT to both lungs again  Heart has been ok,   Feels palpitations on occasion Had labs at Dr. Sherryll Burger office - all look good   No bleeding issues on Eliquis . No hemoptysis .    Current Outpatient Medications  Medication Sig Dispense Refill  . apixaban (ELIQUIS) 5 MG TABS tablet Take 1 tablet (5 mg total) by mouth 2 (two) times daily. 60 tablet 11  . atorvastatin (LIPITOR) 20 MG tablet Take 20 mg by mouth every evening.     . clonazePAM (KLONOPIN) 1 MG tablet Take 1 mg by mouth 3 (three) times daily. 8am, 2pm, 8 pm    .  metoprolol succinate (TOPROL XL) 25 MG 24 hr tablet Take 1 tablet (25 mg total) by mouth daily. 30 tablet 8  . nitroGLYCERIN (NITROSTAT) 0.4 MG SL tablet Place 1 tablet (0.4 mg total) under the tongue every 5 (five) minutes x 3 doses as needed for chest pain. 25 tablet 3   No current facility-administered medications for this visit.     Allergies  Allergen Reactions  . Codeine Other (See Comments)    Dizziness, stomach pain    Past Medical History:  Diagnosis Date  . Adenopathy    RIGHT ADRENAL  . Anxiety   . Arthritis    KNEES/HANDS  . CAD (coronary artery disease)    a. Prior known CTO of LCX with recanalizationby cath 2008;  b. 09/2012 NSTEMI/Cath/PCI: LM nl, LAD 62m, D1 50p, LCX 100p (2.75x16 Promus Premier DES), 80-28m, OM1 30, RCA 59m (3.0x16 Promus Premier DES), EF 50-55%.  Benjamin [name] History of echocardiogram    Echo 2/18: Moderate LVH, EF 55-60, normal wall motion, trivial AI, moderate LAE, mild RVE, mild RAE, mildly elevated pulmonary pressure  . Hx of melanoma of skin   . 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 Non-small cell lung cancer ([place])    a. s/p resection and XRT.  Benjamin [name] PAF (paroxysmal atrial fibrillation) ([place])    a. not currently on anticoagulation (09/2012)  . 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 ADRENALEXTOMY  02/2000  . CARDIAC CATHETERIZATION  2008   LAD 40, D1 OK, D2 50, CFX 90, RCA OK, EF 60%; Consider PCI PRN but the stenosis is quite complex and we would end up jailing several large posterolateral branches    . LEFT HEART CATHETERIZATION WITH CORONARY ANGIOGRAM N/A 10/17/2012   Procedure: LEFT HEART CATHETERIZATION WITH CORONARY ANGIOGRAM;  Surgeon: Sherren Mocha, MD;  Location: [facility] CATH LAB;  Service: Cardiovascular;  Laterality: N/A;  . LOBECTOMY LEFT UPPER LOBE  08/15/2001  . PACEMAKER IMPLANT N/A 08/29/2016   Procedure: Pacemaker Implant;  Surgeon: Evans Lance, MD;  Location: [facility] CV LAB;  Service: Cardiovascular;  Laterality: N/A;  . Right VATS, mini thoracotomy, wedge resection of right upper  01/18/2004    Social History   Tobacco Use  Smoking Status Former Smoker  . Packs/day: 1.00  . Years: 40.00  . Pack years: 40.00  . Types: Cigarettes  . Last attempt to quit:  08/15/2001  . Years since quitting: 15.9  Smokeless Tobacco Never Used    Social History   Substance and Sexual Activity  Alcohol Use No    Family History  Problem Relation Age of Onset  . Heart attack Father   . Cancer Mother        lung  . Cancer Cousin        breast    Review of Systems: The review of systems is per the HPI.  All other systems were reviewed and are negative.  Physical Exam: Blood pressure (!) 142/84, pulse 70, height 5\' 11"  (1.803 m), weight 207 lb 12.8 oz (94.3 kg), SpO2 97 %.  GEN:  Well nourished, well developed in no acute distress HEENT: Normal NECK: No JVD; No carotid bruits LYMPHATICS: No lymphadenopathy CARDIAC: RRR  , occasional premature  Beat s   RESPIRATORY:  Clear to auscultation without rales, wheezing or rhonchi  ABDOMEN: Soft, non-tender, non-distended MUSCULOSKELETAL:  Trace bilateral leg  edema; No deformity  SKIN: Warm and dry NEUROLOGIC:  Alert and oriented x 3    LABORATORY DATA: CBC/BMET and EKG pending  Lab Results  Component Value Date   WBC 9.6 08/23/2016   HGB 15.4 08/23/2016   HCT 46.6 08/23/2016   PLT 141 (L) 08/23/2016   GLUCOSE 83 11/03/2016   CHOL 120 11/03/2016   TRIG 142 11/03/2016   HDL 51 11/03/2016   LDLCALC 41 11/03/2016   ALT 18 11/03/2016   AST 20 11/03/2016   NA 142 11/03/2016   K 4.1 11/03/2016   CL 101 11/03/2016   CREATININE 0.93 05/07/2017   BUN 13 05/07/2017   CO2 26 11/03/2016   TSH 0.774 10/16/2012   INR 1.11 08/24/2015   HGBA1C 5.5 10/16/2012    Lab Results  Component Value Date   TROPONINI 15.20 ([place]) 10/17/2012    Angiographic Findings:  10/16/12  Left main: No obstructive  disease.  Left Anterior Descending Artery: Large caliber vessel that courses to the apex. The mid vessel has diffuse 50% stenosis. There are several diagonal branches. The first diagonal branch is moderate in caliber with proximal 50% stenosis. The second and third diagonal branches are small in caliber with mild proximal disease.  Circumflex Artery: 100% proximal occlusion after a very early moderate caliber OM branch. The first OM branch has diffuse 30% stenosis. After PCI, the mid AV groove Circumflex is seen to fill three OM branches. The AV groove Circumflex has diffuse 80-90% stenosis between the takeoff of the second and third OM branches with same appearance as the cath in 2008.  Right Coronary Artery: Large caliber dominant vessel with hazy 90% mid stenosis with appearance of unstable plaque.   Left Ventricular Angiogram: LVEF=50-55%.   Impression:  1. Triple vessel CAD with NSTEMI secondary to acute occlusion of Circumflex.  2. Severe stenosis, unstable plaque mid RCA  3. NSTEMI  4. Preserved LV systolic function  5. Successful PTCA/DES x 1 mid RCA  6. Successful PTCA/DES x 1 proximal Circumflex   Recommendations: He will need dual anti-platelet therapy with ASA and Effient for one year. Continue statin. No beta blocker with heart block.   Complications: None. The patient tolerated the procedure well.   ECG    Assessment / Plan:  1. CAD - DES to RCA and prox LCx.  (10/16/12) -  No angina , continue same meds   2. Atrial fib: -    Has a pacer   3. Non-small cell lung ca. -  S/p recent round of XRT.   4. Leg edema -    Has trace  edem a  5. Hyperlipidemia:   Labs from Dr. Felipa Eth look great    Mertie Moores, MD  07/17/2017 11:41 AM    [address],  [place], [place]  [address] Pager [phone] Phone: [phone]; Fax: [phone]

## 2017-07-17 NOTE — Patient Instructions (Signed)
Medication Instructions:  Your physician recommends that you continue on your current medications as directed. Please refer to the Current Medication list given to you today.   Labwork: None Ordered   Testing/Procedures: None Ordered   Follow-Up: Your physician wants you to follow-up in: 6 months with a PA or Nurse Practitioner on Dr. Elmarie Shiley team. You will receive a reminder letter in the mail two months in advance. If you don't receive a letter, please call our office to schedule the follow-up appointment.   If you need a refill on your cardiac medications before your next appointment, please call your pharmacy.   Thank you for choosing [facility]! Christen Bame, RN [phone]

## 2017-07-30 DIAGNOSIS — L814 Other melanin hyperpigmentation: Secondary | ICD-10-CM | POA: Diagnosis not present

## 2017-07-30 DIAGNOSIS — L821 Other seborrheic keratosis: Secondary | ICD-10-CM | POA: Diagnosis not present

## 2017-07-30 DIAGNOSIS — Z85820 Personal history of malignant melanoma of skin: Secondary | ICD-10-CM | POA: Diagnosis not present

## 2017-07-30 DIAGNOSIS — L570 Actinic keratosis: Secondary | ICD-10-CM | POA: Diagnosis not present

## 2017-07-30 DIAGNOSIS — Z85828 Personal history of other malignant neoplasm of skin: Secondary | ICD-10-CM | POA: Diagnosis not present

## 2017-08-06 ENCOUNTER — Ambulatory Visit: Payer: Self-pay | Admitting: Radiation Oncology

## 2017-08-08 ENCOUNTER — Ambulatory Visit
Admission: RE | Admit: 2017-08-08 | Discharge: 2017-08-08 | Disposition: A | Discharge status: Home / Self Care | Payer: Medicare Other | Source: Ambulatory Visit | Attending: Radiation Oncology | Admitting: Radiation Oncology

## 2017-08-08 ENCOUNTER — Ambulatory Visit: Payer: Self-pay | Admitting: Urology

## 2017-08-08 ENCOUNTER — Other Ambulatory Visit: Payer: Self-pay

## 2017-08-08 ENCOUNTER — Encounter: Payer: Self-pay | Admitting: Radiation Oncology

## 2017-08-08 VITALS — BP 142/94 | HR 83 | Temp 97.9°F | Resp 18 | Wt 205.4 lb

## 2017-08-08 DIAGNOSIS — Z923 Personal history of irradiation: Secondary | ICD-10-CM | POA: Insufficient documentation

## 2017-08-08 DIAGNOSIS — C7802 Secondary malignant neoplasm of left lung: Secondary | ICD-10-CM | POA: Insufficient documentation

## 2017-08-08 DIAGNOSIS — C3412 Malignant neoplasm of upper lobe, left bronchus or lung: Secondary | ICD-10-CM | POA: Diagnosis not present

## 2017-08-08 DIAGNOSIS — Z79899 Other long term (current) drug therapy: Secondary | ICD-10-CM | POA: Insufficient documentation

## 2017-08-08 DIAGNOSIS — C7801 Secondary malignant neoplasm of right lung: Secondary | ICD-10-CM | POA: Insufficient documentation

## 2017-08-08 DIAGNOSIS — C3431 Malignant neoplasm of lower lobe, right bronchus or lung: Secondary | ICD-10-CM

## 2017-08-08 DIAGNOSIS — C3400 Malignant neoplasm of unspecified main bronchus: Secondary | ICD-10-CM

## 2017-08-09 ENCOUNTER — Other Ambulatory Visit: Payer: Self-pay | Admitting: Radiation Oncology

## 2017-08-09 DIAGNOSIS — C3431 Malignant neoplasm of lower lobe, right bronchus or lung: Secondary | ICD-10-CM

## 2017-08-09 NOTE — Progress Notes (Signed)
Radiation Oncology         [phone] ________________________________  Name: Benjamin [name] MRN: [id number]  Date of Service: 08/08/2017 DOB: November 07, 1945  Post Treatment Note  CC: Lajean Manes, MD  Melrose Nakayama, *  Diagnosis:   Multiple Stage I, NSCLC of the left upper lobe, right upper lobe, left lower lobe, and right lower lobe.  Interval Since Last Radiation:  5 weeks   06/27/2017-07/06/2017 SBRT Treatment: The staple line lesion in the left lung was treated to 50 Gy in 5 fractions of 10 Gy.  The RLL lesion was treated to 54 Gy in 3 fractions of 18 Gy.   09/20/15-09/27/15 SBRT Treatment:  Right lower lobe was treated to 54 Gy in 3 fractions of 18 Gy.  06/03/07- 07/05/07:  62.5 Gy in 25 fractions of 2.5 Gy to an inferior left upper lobe target    Narrative:  The patient returns today for routine follow-up. He has previously undergone resection of the left upper lobe in 2003, as well as a resection of the right upper lobe in 2005,and does have some chronic issues with breathing as a result of lower lung volume. He completed SBRT in July 2017 to the right lower lobe.  Since February 2018, we've been watching a  8 mm ill definite density in the superior segment of the right lower lobe. Short interval scan on 07/28/16 revealed stability of the 37mm nodule in the superior aspect of the right lower lobe. There was also ground glass attenuation in the RLL with a solid component and the component was previously 1.1 cm, and in May, was 2.1 cm. 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 Again we discussed options for short interval scan and he comes today to review this from 11/08/16. Stability of theright lower lobe 6 mm nodule, and right lower lobe consolidation was stable and present, and the prior noted ground glass nodule was less dense in the right middle lobe. Along the left lung, persistent consolidation was stable as was the post surgical change in the left upper lobe. He does  appear to have a fracture around right anterior/lateral rib 6 from my read, though this was not listed in the report. This was a new finding since his scan on 07/28/16. He continued in surveillance until addition scans revealed PET positive changes at the prior staple line in the left lung and in the RLL. He received SBRT to the RLL, and to the staple line. He comes today for follow up.    On review of systems, the patient states he's doing well. He is not having any changes in his breathing. No fevers, chest pain, or cough is noted. He does have some occasional pulling sensation in his chest on the left. No other complaints are noted.  ALLERGIES:  is allergic to codeine.  Meds: Current Outpatient Medications  Medication Sig Dispense Refill  . apixaban (ELIQUIS) 5 MG TABS tablet Take 1 tablet (5 mg total) by mouth 2 (two) times daily. 60 tablet 11  . atorvastatin (LIPITOR) 20 MG tablet Take 20 mg by mouth every evening.     . clonazePAM (KLONOPIN) 1 MG tablet Take 1 mg by mouth 3 (three) times daily. 8am, 2pm, 8 pm    . metoprolol succinate (TOPROL XL) 25 MG 24 hr tablet Take 1 tablet (25 mg total) by mouth daily. 30 tablet 8  . nitroGLYCERIN (NITROSTAT) 0.4 MG SL tablet Place 1 tablet (0.4 mg total) under the tongue every  5 (five) minutes x 3 doses as needed for chest pain. 25 tablet 3   No current facility-administered medications for this encounter.     Physical Findings:  weight is 205 lb 6.4 oz (93.2 kg). His oral temperature is 97.9 F (36.6 C). His blood pressure is 142/94 (abnormal) and his pulse is 83. His respiration is 18 and oxygen saturation is 100%.  Pain Assessment Pain Score: 2 (left side of chest discomfort)/10 In general this is a well appearing caucasian male in no acute distress. He's alert and oriented x4 and appropriate throughout the examination. Cardiopulmonary assessment is negative for acute distress and he exhibits normal effort. Chest is clear to auscultation on  the left but with rales throughout on the right. Lab Findings: Lab Results  Component Value Date   WBC 9.6 08/23/2016   HGB 15.4 08/23/2016   HCT 46.6 08/23/2016   MCV 88 08/23/2016   PLT 141 (L) 08/23/2016     Radiographic Findings: No results found.  Impression/Plan: 1. Multiple Stage I, NSCLC of the left upper lobe, right upper lobe, left lower lobe, and right lower lobe. The patient will be due for CT chest in the next week or two. We will coordinate this and follow up with the results by phone. Otherwise we will plan surveillance to resume every 6 months.      Carola Rhine, PAC

## 2017-08-16 ENCOUNTER — Telehealth: Payer: Self-pay | Admitting: *Deleted

## 2017-08-16 NOTE — Telephone Encounter (Signed)
CALLED PATIENT TO INFORM OF STAT LABS FOR 08-23-17- -ARRIVAL TIME - 9:15 AM @ [facility] AND HIS CT FOR 08-23-17- ARRIVAL TIME- 10:15 AM @ [facility], PT. TO BE NPO- 4 HRS. PRIOR TO TEST, SPOKE WITH PATIENT AND HE IS AWARE OF THESE APPTS.

## 2017-08-23 ENCOUNTER — Other Ambulatory Visit: Payer: Self-pay | Admitting: *Deleted

## 2017-08-23 ENCOUNTER — Ambulatory Visit (HOSPITAL_COMMUNITY)
Admission: RE | Admit: 2017-08-23 | Discharge: 2017-08-23 | Disposition: A | Discharge status: Home / Self Care | Payer: Medicare Other | Source: Ambulatory Visit | Attending: Radiation Oncology | Admitting: Radiation Oncology

## 2017-08-23 ENCOUNTER — Ambulatory Visit (HOSPITAL_COMMUNITY): Payer: Medicare Other

## 2017-08-23 ENCOUNTER — Ambulatory Visit: Payer: Medicare Other

## 2017-08-23 ENCOUNTER — Encounter (HOSPITAL_COMMUNITY): Payer: Self-pay

## 2017-08-23 ENCOUNTER — Ambulatory Visit
Admission: RE | Admit: 2017-08-23 | Discharge: 2017-08-23 | Disposition: A | Discharge status: Home / Self Care | Payer: Medicare Other | Source: Ambulatory Visit | Attending: Radiation Oncology | Admitting: Radiation Oncology

## 2017-08-23 DIAGNOSIS — C3431 Malignant neoplasm of lower lobe, right bronchus or lung: Secondary | ICD-10-CM

## 2017-08-23 DIAGNOSIS — J439 Emphysema, unspecified: Secondary | ICD-10-CM | POA: Diagnosis not present

## 2017-08-23 DIAGNOSIS — C3400 Malignant neoplasm of unspecified main bronchus: Secondary | ICD-10-CM | POA: Insufficient documentation

## 2017-08-23 DIAGNOSIS — Z08 Encounter for follow-up examination after completed treatment for malignant neoplasm: Secondary | ICD-10-CM | POA: Insufficient documentation

## 2017-08-23 DIAGNOSIS — Z85118 Personal history of other malignant neoplasm of bronchus and lung: Secondary | ICD-10-CM | POA: Insufficient documentation

## 2017-08-23 DIAGNOSIS — I700 Atherosclerosis of aorta: Secondary | ICD-10-CM | POA: Diagnosis not present

## 2017-08-23 DIAGNOSIS — R918 Other nonspecific abnormal finding of lung field: Secondary | ICD-10-CM | POA: Diagnosis not present

## 2017-08-23 LAB — BUN & CREATININE (CHCC)
BUN: 13 mg/dL (ref 7–26)
Creatinine: 1.01 mg/dL (ref 0.70–1.30)

## 2017-08-23 MED ORDER — IOPAMIDOL (ISOVUE-300) INJECTION 61%
INTRAVENOUS | Status: AC
  Filled 2017-08-23: qty 100

## 2017-08-23 MED ORDER — IOPAMIDOL (ISOVUE-300) INJECTION 61%
75.0000 mL | Freq: Once | INTRAVENOUS | Status: AC | PRN
  Administered 2017-08-23: 75 mL via INTRAVENOUS

## 2017-08-28 ENCOUNTER — Other Ambulatory Visit: Payer: Self-pay | Admitting: Radiation Oncology

## 2017-08-28 ENCOUNTER — Encounter: Payer: Self-pay | Admitting: *Deleted

## 2017-08-28 DIAGNOSIS — C3402 Malignant neoplasm of left main bronchus: Secondary | ICD-10-CM

## 2017-08-28 DIAGNOSIS — C3400 Malignant neoplasm of unspecified main bronchus: Secondary | ICD-10-CM

## 2017-08-28 DIAGNOSIS — C3431 Malignant neoplasm of lower lobe, right bronchus or lung: Secondary | ICD-10-CM

## 2017-08-28 NOTE — Progress Notes (Signed)
[place] left a message for him to call  Back and  Ask for Tonie at [phone] about his recent CT chest scan result per [name], PA.

## 2017-08-28 NOTE — Progress Notes (Signed)
[place]  Mr. Gasparini returned call in re: to his recent CT chest scan told that the area area in  left lung has gotten smaller from 2.5 x 1.8 to 1.8 to 1.4.  The right lung is stable since his radiation treatment to the middle lobe stable.  Bryson Ha plans to repeat your scan in 6 months or earlier if you have any problems.  Call us if you notice any problems like increased SOB, coughing more than usual or wheezing.   Mr. Mangano was in agreement with this plan and was grateful for the telephone this afternoon.

## 2017-09-04 ENCOUNTER — Encounter: Payer: Medicare Other | Admitting: Internal Medicine

## 2017-09-04 ENCOUNTER — Telehealth: Payer: Self-pay | Admitting: Internal Medicine

## 2017-09-04 NOTE — Telephone Encounter (Signed)
New Message:      Pt is calling and would like to speak with Mindy.

## 2017-09-04 NOTE — Telephone Encounter (Signed)
Pt calling and needing some samples  Patient calling the office for samples of medication:   1.  What medication and dosage are you requesting samples for? Eliquis 5mg  2.  Are you currently out of this medication? Yes  Pt spilled them in the sink and they got wet.

## 2017-09-04 NOTE — Telephone Encounter (Signed)
Called pt to inform him that I could leave a box of Eliquis 5 mg tablets at the front desk for pt to pick up. Pt verbalized understanding.

## 2017-09-07 ENCOUNTER — Encounter: Payer: Self-pay | Admitting: Internal Medicine

## 2017-09-07 ENCOUNTER — Ambulatory Visit (INDEPENDENT_AMBULATORY_CARE_PROVIDER_SITE_OTHER): Payer: Medicare Other | Admitting: Internal Medicine

## 2017-09-07 VITALS — BP 126/90 | HR 80 | Ht 71.0 in | Wt 205.0 lb

## 2017-09-07 DIAGNOSIS — I482 Chronic atrial fibrillation, unspecified: Secondary | ICD-10-CM

## 2017-09-07 DIAGNOSIS — I442 Atrioventricular block, complete: Secondary | ICD-10-CM

## 2017-09-07 DIAGNOSIS — Z950 Presence of cardiac pacemaker: Secondary | ICD-10-CM

## 2017-09-07 DIAGNOSIS — I4819 Other persistent atrial fibrillation: Secondary | ICD-10-CM

## 2017-09-07 DIAGNOSIS — I2510 Atherosclerotic heart disease of native coronary artery without angina pectoris: Secondary | ICD-10-CM

## 2017-09-07 DIAGNOSIS — I481 Persistent atrial fibrillation: Secondary | ICD-10-CM

## 2017-09-07 LAB — CUP PACEART INCLINIC DEVICE CHECK
Battery Voltage: 3.02 V
Brady Statistic AP VP Percent: 0.01 %
Brady Statistic AS VP Percent: 0 %
Brady Statistic RA Percent Paced: 92.98 %
Brady Statistic RV Percent Paced: 0.01 %
Implantable Lead Model: 3830
Implantable Lead Model: 5076
Lead Channel Impedance Value: 304 Ohm
Lead Channel Impedance Value: 418 Ohm
Lead Channel Pacing Threshold Amplitude: 0.5 V
Lead Channel Pacing Threshold Pulse Width: 0.4 ms
Lead Channel Sensing Intrinsic Amplitude: 12 mV
Lead Channel Setting Pacing Amplitude: 2 V
Lead Channel Setting Pacing Pulse Width: 0.4 ms
MDC IDC LEAD IMPLANT DT: 20180612
MDC IDC LEAD IMPLANT DT: 20180612
MDC IDC LEAD LOCATION: 753860
MDC IDC LEAD LOCATION: 753860
MDC IDC MSMT BATTERY REMAINING LONGEVITY: 118 mo
MDC IDC MSMT LEADCHNL RA IMPEDANCE VALUE: 437 Ohm
MDC IDC MSMT LEADCHNL RA PACING THRESHOLD PULSEWIDTH: 1 ms
MDC IDC MSMT LEADCHNL RA SENSING INTR AMPL: 3.625 mV
MDC IDC MSMT LEADCHNL RA SENSING INTR AMPL: 7 mV
MDC IDC MSMT LEADCHNL RV IMPEDANCE VALUE: 494 Ohm
MDC IDC MSMT LEADCHNL RV PACING THRESHOLD AMPLITUDE: 0.5 V
MDC IDC MSMT LEADCHNL RV SENSING INTR AMPL: 12 mV
MDC IDC PG IMPLANT DT: 20180612
MDC IDC SESS DTM: 20190621115736
MDC IDC SET LEADCHNL RV PACING AMPLITUDE: 2 V
MDC IDC SET LEADCHNL RV SENSING SENSITIVITY: 0.9 mV
MDC IDC STAT BRADY AP VS PERCENT: 93.25 %
MDC IDC STAT BRADY AS VS PERCENT: 6.74 %

## 2017-09-07 NOTE — Patient Instructions (Signed)
Medication Instructions:  Your physician recommends that you continue on your current medications as directed. Please refer to the Current Medication list given to you today.  Labwork: None ordered.  Testing/Procedures: None ordered.  Follow-Up: Your physician wants you to follow-up in: one year with Dr. Lovena Le.   You will receive a reminder letter in the mail two months in advance. If you don't receive a letter, please call our office to schedule the follow-up appointment.  Remote monitoring is used to monitor your Pacemaker from home. This monitoring reduces the number of office visits required to check your device to one time per year. It allows [place] to keep an eye on the functioning of your device to ensure it is working properly. You are scheduled for a device check from home on 12/10/2017. You may send your transmission at any time that day. If you have a wireless device, the transmission will be sent automatically. After your physician reviews your transmission, you will receive a postcard with your next transmission date.  Any Other Special Instructions Will Be Listed Below (If Applicable).  If you need a refill on your cardiac medications before your next appointment, please call your pharmacy.

## 2017-09-07 NOTE — Progress Notes (Signed)
HPI Benjamin [name] returns today for followup. He has a h/o bronchoalveolar CA of the lung, s/p resection with recurrence, s/p multiple rounds of XRT. He developed symptomatic bradycardia and underwent PPM insertion. He also had chronic atrial fib. He has underlying high grade heart block. Minimal edema. He has had some fatigue after going through more XRT. Allergies  Allergen Reactions  . Codeine Other (See Comments)    Dizziness, stomach pain     Current Outpatient Medications  Medication Sig Dispense Refill  . apixaban (ELIQUIS) 5 MG TABS tablet Take 1 tablet (5 mg total) by mouth 2 (two) times daily. 60 tablet 11  . atorvastatin (LIPITOR) 20 MG tablet Take 20 mg by mouth every evening.     . clonazePAM (KLONOPIN) 1 MG tablet Take 1 mg by mouth 3 (three) times daily. 8am, 2pm, 8 pm    . metoprolol succinate (TOPROL XL) 25 MG 24 hr tablet Take 1 tablet (25 mg total) by mouth daily. 30 tablet 8  . nitroGLYCERIN (NITROSTAT) 0.4 MG SL tablet Place 1 tablet (0.4 mg total) under the tongue every 5 (five) minutes x 3 doses as needed for chest pain. 25 tablet 3   No current facility-administered medications for this visit.      Past Medical History:  Diagnosis Date  . Adenopathy    RIGHT ADRENAL  . Anxiety   . Arthritis    KNEES/HANDS  . CAD (coronary artery disease)    a. Prior known CTO of LCX with recanalizationby cath 2008;  b. 09/2012 NSTEMI/Cath/PCI: LM nl, LAD 45m, D1 50p, LCX 100p (2.75x16 Promus Premier DES), 80-41m, OM1 30, RCA 52m (3.0x16 Promus Premier DES), EF 50-55%.  [name] History of echocardiogram    Echo 2/18: Moderate LVH, EF 55-60, normal wall motion, trivial AI, moderate LAE, mild RVE, mild RAE, mildly elevated pulmonary pressure  . Hx of melanoma of skin   . 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 Non-small cell lung cancer ([place])    a. s/p resection and XRT.  [name] PAF (paroxysmal atrial fibrillation) ([place])    a. not currently on anticoagulation (09/2012)  . 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   Past Surgical History:  Procedure Laterality Date  . ADRENALEXTOMY  02/2000  . CARDIAC CATHETERIZATION  2008   LAD 40, D1 OK, D2 50, CFX 90, RCA OK, EF 60%; Consider PCI PRN but the stenosis is quite complex and we would end up jailing several large posterolateral branches    . LEFT HEART CATHETERIZATION WITH CORONARY ANGIOGRAM N/A 10/17/2012   Procedure: LEFT HEART CATHETERIZATION WITH CORONARY ANGIOGRAM;  Surgeon: Sherren Mocha, MD;  Location: [facility] CATH LAB;  Service: Cardiovascular;  Laterality: N/A;  . LOBECTOMY LEFT UPPER LOBE  08/15/2001  . PACEMAKER IMPLANT N/A 08/29/2016   Procedure: Pacemaker Implant;  Surgeon: Evans Lance, MD;  Location: [facility] CV LAB;  Service: Cardiovascular;  Laterality: N/A;  . Right VATS, mini thoracotomy, wedge resection of right upper  01/18/2004     Family History  Problem Relation Age of Onset  . Heart attack Father   . Cancer Mother        lung  . Cancer Cousin        breast     Social History   Socioeconomic History  . Marital status: Divorced    Spouse name: Not on  file  . Number of children: Not on file  . Years of education: Not on file  . Highest education level: Not on file  Occupational History  . Occupation: Retired    Fish farm manager: [employer]  . Financial resource strain: Not on file  . Food insecurity:    Worry: Not on file    Inability: Not on file  . Transportation needs:    Medical: Not on file    Non-medical: Not on file  Tobacco Use  . Smoking status: Former Smoker    Packs/day: 1.00    Years: 40.00    Pack years: 40.00    Types: Cigarettes    Last attempt to quit: 08/15/2001    Years since quitting: 16.0  . Smokeless tobacco: Never Used  Substance and Sexual Activity  . Alcohol use: No  . Drug use: No  . Sexual activity: Not Currently  Lifestyle  . Physical  activity:    Days per week: Not on file    Minutes per session: Not on file  . Stress: Not on file  Relationships  . Social connections:    Talks on phone: Not on file    Gets together: Not on file    Attends religious service: Not on file    Active member of club or organization: Not on file    Attends meetings of clubs or organizations: Not on file    Relationship status: Not on file  . Intimate partner violence:    Fear of current or ex partner: Not on file    Emotionally abused: Not on file    Physically abused: Not on file    Forced sexual activity: Not on file  Other Topics Concern  . Not on file  Social History Narrative  . Not on file     BP 126/90   Pulse 80   Ht 5\' 11"  (1.803 m)   Wt 205 lb (93 kg)   SpO2 97%   BMI 28.59 kg/m   Physical Exam:  Well appearing 72 yo man, NAD HEENT: Unremarkable Neck:  No JVD, no thyromegally Lymphatics:  No adenopathy Back:  No CVA tenderness Lungs:  Clear with no wheezes HEART:  Regular rate rhythm, no murmurs, no rubs, no clicks Abd:  soft, positive bowel sounds, no organomegally, no rebound, no guarding Ext:  2 plus pulses, no edema, no cyanosis, no clubbing Skin:  No rashes no nodules Neuro:  CN II through XII intact, motor grossly intact  EKG - atrial fib with his bundle pacing  DEVICE  Normal device function.  See PaceArt for details.   Assess/Plan: 1. Atrial fib - his ventricular rate is well controlled. He will continue his current meds. 2. PPM - his medtronic PPM is working normally. His  RA port is attached to a His bundle lead, RV in the RVA.  3. CAD - he denies anginal symptoms. We will follow. 4. Lung CA - he is s/p XRT and his CA is stable. Denies cough or hemooptysis.  Benjamin [name].D.

## 2017-09-11 DIAGNOSIS — F411 Generalized anxiety disorder: Secondary | ICD-10-CM | POA: Diagnosis not present

## 2017-09-14 ENCOUNTER — Encounter: Payer: Medicare Other | Admitting: Internal Medicine

## 2017-10-23 DIAGNOSIS — K4090 Unilateral inguinal hernia, without obstruction or gangrene, not specified as recurrent: Secondary | ICD-10-CM | POA: Diagnosis not present

## 2017-10-23 DIAGNOSIS — C3412 Malignant neoplasm of upper lobe, left bronchus or lung: Secondary | ICD-10-CM | POA: Diagnosis not present

## 2017-10-23 DIAGNOSIS — C3411 Malignant neoplasm of upper lobe, right bronchus or lung: Secondary | ICD-10-CM | POA: Diagnosis not present

## 2017-10-23 DIAGNOSIS — K5901 Slow transit constipation: Secondary | ICD-10-CM | POA: Diagnosis not present

## 2017-10-23 DIAGNOSIS — I480 Paroxysmal atrial fibrillation: Secondary | ICD-10-CM | POA: Diagnosis not present

## 2017-11-17 DIAGNOSIS — Z23 Encounter for immunization: Secondary | ICD-10-CM | POA: Diagnosis not present

## 2017-11-20 DIAGNOSIS — K4090 Unilateral inguinal hernia, without obstruction or gangrene, not specified as recurrent: Secondary | ICD-10-CM | POA: Diagnosis not present

## 2017-11-20 DIAGNOSIS — D696 Thrombocytopenia, unspecified: Secondary | ICD-10-CM | POA: Diagnosis not present

## 2017-11-20 DIAGNOSIS — E7800 Pure hypercholesterolemia, unspecified: Secondary | ICD-10-CM | POA: Diagnosis not present

## 2017-11-20 DIAGNOSIS — I480 Paroxysmal atrial fibrillation: Secondary | ICD-10-CM | POA: Diagnosis not present

## 2017-11-20 DIAGNOSIS — Z79899 Other long term (current) drug therapy: Secondary | ICD-10-CM | POA: Diagnosis not present

## 2017-11-20 DIAGNOSIS — K5901 Slow transit constipation: Secondary | ICD-10-CM | POA: Diagnosis not present

## 2017-12-13 ENCOUNTER — Telehealth: Payer: Self-pay | Admitting: *Deleted

## 2017-12-13 NOTE — Telephone Encounter (Signed)
Patient presented to the office to request eliquis samples as he is in the donut hole. One box of samples provided to patient.

## 2017-12-28 ENCOUNTER — Telehealth: Payer: Self-pay

## 2017-12-28 NOTE — Telephone Encounter (Signed)
**Note De-Identified Benjamin [name] Obfuscation** The pt walked into the office and asked our phlebotomist for samples of Eliquis as he often does. I went to the lab to discuss options with the pt to help him afford his Eliquis.  The pt has been advised multiple times that he will need to apply for pt asst for Eliquis but he always declines stating that he makes too much money to qualify for pt asst.  I gave him another [place] pt asst application and he states that he will work on the application and get it back to me as soon as he can.

## 2018-01-14 ENCOUNTER — Ambulatory Visit (INDEPENDENT_AMBULATORY_CARE_PROVIDER_SITE_OTHER): Payer: Medicare Other | Admitting: Physician Assistant

## 2018-01-14 ENCOUNTER — Telehealth: Payer: Self-pay

## 2018-01-14 ENCOUNTER — Encounter: Payer: Self-pay | Admitting: Physician Assistant

## 2018-01-14 VITALS — BP 138/90 | HR 72 | Ht 71.0 in | Wt 203.4 lb

## 2018-01-14 DIAGNOSIS — Z950 Presence of cardiac pacemaker: Secondary | ICD-10-CM

## 2018-01-14 DIAGNOSIS — I4821 Permanent atrial fibrillation: Secondary | ICD-10-CM | POA: Diagnosis not present

## 2018-01-14 DIAGNOSIS — I2510 Atherosclerotic heart disease of native coronary artery without angina pectoris: Secondary | ICD-10-CM | POA: Diagnosis not present

## 2018-01-14 NOTE — Patient Instructions (Addendum)
Medication Instructions:  Your physician recommends that you continue on your current medications as directed. Please refer to the Current Medication list given to you today.  If you need a refill on your cardiac medications before your next appointment, please call your pharmacy.   Lab work: NONE ORDERED TODAY If you have labs (blood work) drawn today and your tests are completely normal, you will receive your results only by: [name] MyChart Message (if you have MyChart) OR . A paper copy in the mail If you have any lab test that is abnormal or we need to change your treatment, we will call you to review the results.  Testing/Procedures: NONE ORDERED TODAY  Follow-Up: At [facility], you and your health needs are our priority.  As part of our continuing mission to provide you with exceptional heart care, we have created designated Provider Care Teams.  These Care Teams include your primary Cardiologist (physician) and Advanced Practice Providers (APPs -  Physician Assistants and Nurse Practitioners) who all work together to provide you with the care you need, when you need it. You will need a follow up appointment in 2 months.  Please call our office 2 months in advance to schedule this appointment. PER DEVICE CLINIC YOU WILL NEED TO SCHEDULE AN APPT TO BE SEEN IN December FOR DEVICE CHECK/ Cristopher Peru, MD  or one of the following Advanced Practice Providers on your designated Care Team:   Chanetta Marshall, NP . Tommye Standard, PA-C  YOU WILL NEED AN APPT TO SEE DR. NAHSER IN 6 MONTHS; WE WILL SEND OUT A REMINDER.   Any Other Special Instructions Will Be Listed Below (If Applicable). 1. CHECK BLOOD PRESSURE SEVERAL TIMES PER WEEK FOR THE NEXT 2 WEEKS; CALL OR SEND READINGS TO [phone] SCOTT WEAVER, PAC   2. CHECK WITH YOUR INSURANCE COMPANY TO CHECK ON THE COST OF XARELTO 20 MG DAILY; PLEASE LET OUR OFFICE KNOW IF THIS WILL BE CHEAPER FOR YOU  3. YOU HAVE BEEN GIVEN SAMPLES OF ELIQUIS  TODAY

## 2018-01-14 NOTE — Telephone Encounter (Signed)
While at a OV with Richardson Dopp today the pt requested samples of Eliquis.  I gave him 2 boxes of Eliquis samples and another [place] pt asst application so that he can apply for help paying for Eliquis.  He has been getting samples from this office for months and never brings back the pt asst applications that we give to him. He was advised at his OV with Nicki Reaper today that he must call his insurance company to see if Xarelto is less expensive for him and if not a decision will have to be made as the office cannot continue to give him samples.

## 2018-01-14 NOTE — Progress Notes (Signed)
Cardiology Office Note:    Date:  01/14/2018   ID:  Benjamin [name], DOB [birth year], MRN [id number]  PCP:  Lajean Manes, MD  Cardiologist:  Mertie Moores, MD    Electrophysiologist:  Cristopher Peru, MD  TCTS:  Dr. Roxan Hockey Oncology: Dr. Benay Spice Radiation Oncologist: Dr. Tammi Klippel  Referring MD: Lajean Manes, MD   Chief Complaint  Patient presents with  . Follow-up    AFib, CAD     History of Present Illness:    Benjamin [name] is a 72 y.o. male with CAD status post non-STEMI treated with PCI with DES to the RCA and DES to the LCx in 7/14, atrial fibrillation, recurrent lung CA status post resection and radiation, complete heart block status post dual-chamber pacemaker implantation in June 2018.   He is on Apixaban for anticoagulation.  He was last seen by Dr. Acie Fredrickson in April 2019.     Benjamin [name] returns for follow-up.  He is here alone.  He has chronic shortness of breath related to his prior lung surgeries.  He denies chest discomfort, orthopnea, PND, syncope.  He does have bilateral ankle swelling.  This is fairly stable.  He recently developed a right inguinal hernia.  He is currently having issues affording Eliquis as he is in the "donut hole".  Prior CV studies:   The following studies were reviewed today:  Echo 05/11/2016 Moderate LVH, EF 55-60, normal wall motion, trivial AI, moderate LAE, mild RV dilation, mild RAE, mild increased PASP  Echo 12/15 Mild LVH, EF 60-65, no RWMA, Gr 1 DD, mild AI  Myoview 5/15 Intermediate risk stress nuclear study with a scar in the basal and mid inferolateral walls and apical lateral wall with possible mild periinfarct ischemia. The interpretantion of the study is affected by significant extracardiac activity. LV Ejection Fraction: Study not gated.  LHC 7/14 LAD mid 50, D1 proximal 50 LCx proximal 100, AV groove diffuse 80-90; OM1 30 RCA mid hazy 90 EF 50-55 PCI:  3 x 16 mm Promus Premier DES to the mid RCA PCI: 2.75 x 16 mm  Promus Premier DES to the proximal LCx  Past Medical History:  Diagnosis Date  . Adenopathy    RIGHT ADRENAL  . Anxiety   . Arthritis    KNEES/HANDS  . CAD (coronary artery disease)    a. Prior known CTO of LCX with recanalizationby cath 2008;  b. 09/2012 NSTEMI/Cath/PCI: LM nl, LAD 43m, D1 50p, LCX 100p (2.75x16 Promus Premier DES), 80-14m, OM1 30, RCA 59m (3.0x16 Promus Premier DES), EF 50-55%.  [name] History of echocardiogram    Echo 2/18: Moderate LVH, EF 55-60, normal wall motion, trivial AI, moderate LAE, mild RVE, mild RAE, mildly elevated pulmonary pressure  . Hx of melanoma of skin   . 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 Non-small cell lung cancer ([place])    a. s/p resection and XRT.  [name] PAF (paroxysmal atrial fibrillation) ([place])    a. not currently on anticoagulation (09/2012)  . 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   Current Medications: Current Meds  Medication Sig  . apixaban (ELIQUIS) 5 MG TABS tablet Take 1 tablet (5 mg total) by mouth 2 (two) times daily.  [name]  atorvastatin (LIPITOR) 20 MG tablet Take 20 mg by mouth every evening.   . clonazePAM (KLONOPIN) 1 MG tablet Take 1 mg by mouth 3 (three) times daily. 8am, 2pm, 8 pm  . metoprolol succinate (TOPROL XL) 25 MG 24 hr tablet Take 1 tablet (25 mg total) by mouth daily.  . nitroGLYCERIN (NITROSTAT) 0.4 MG SL tablet Place 1 tablet (0.4 mg total) under the tongue every 5 (five) minutes x 3 doses as needed for chest pain.     Allergies:   Codeine   Social History   Tobacco Use  . Smoking status: Former Smoker    Packs/day: 1.00    Years: 40.00    Pack years:  40.00    Types: Cigarettes    Last attempt to quit: 08/15/2001    Years since quitting: 16.4  . Smokeless tobacco: Never Used  Substance Use Topics  . Alcohol use: No  . Drug use: No     Family Hx: The patient's family history includes Cancer in his cousin and mother; Heart attack in his father.  ROS:   Please see the history of present illness.    ROS All other systems reviewed and are negative.   EKGs/Labs/Other Test Reviewed:    EKG:  EKG is not ordered today.   Recent Labs: 08/23/2017: BUN 13; Creatinine 1.01   Recent Lipid Panel Lab Results  Component Value Date/Time   CHOL 120 11/03/2016 12:10 PM   TRIG 142 11/03/2016 12:10 PM   HDL 51 11/03/2016 12:10 PM   CHOLHDL 2.4 11/03/2016 12:10 PM   CHOLHDL 3 09/14/2014 11:59 AM   LDLCALC 41 11/03/2016 12:10 PM   From [facility] Tool: Cholesterol, total 114.000 05/18/2017 HDL 44.000 05/18/2017 LDL 53.000 05/18/2017 Triglycerides 82.000 05/18/2017 Hemoglobin 15.400 11/20/2017 Creatinine, Serum 0.880 11/20/2017 Potassium 4.100 11/20/2017 Magnesium N/D ALT (SGPT) 13.000 05/18/2017 TSH 1.400 05/04/2015 INR 1.110 08/24/2015 Platelets 141.000 08/23/2016   Physical Exam:    VS:  BP 138/90   Pulse 72   Ht 5\' 11"  (1.803 m)   Wt 203 lb 6.4 oz (92.3 kg)   SpO2 97%   BMI 28.37 kg/m     Wt Readings from Last 3 Encounters:  01/14/18 203 lb 6.4 oz (92.3 kg)  09/07/17 205 lb (93 kg)  08/08/17 205 lb 6.4 oz (93.2 kg)     Physical Exam  Constitutional: He is oriented to person, place, and time. He appears well-developed and well-nourished. No distress.  HENT:  Head: Normocephalic and atraumatic.  Eyes: No scleral icterus.  Neck: No JVD present. Carotid bruit is not present.  Cardiovascular: Normal rate and regular rhythm.  No murmur heard. Pulmonary/Chest: He has no rales.  Abdominal: Soft. He exhibits no distension.  Musculoskeletal: He exhibits edema (trace-1+ bilat ankle edema).  Neurological: He is alert and oriented to person, place,  and time.  Skin: Skin is warm and dry.    ASSESSMENT & PLAN:    Permanent atrial fibrillation He is having difficulty affording Apixaban.  We will try to obtain samples.  I have asked him to look into Xarelto to see if it is cheaper.  Hemoglobin, creatinine in September 2019 was normal.  Coronary artery disease involving native coronary artery of native heart without angina pectoris History of myocardial infarction in 2014 treated with drug-eluting stent to the RCA and LCx.  He denies angina.  He is not on aspirin as he is on Apixaban.  Continue statin therapy.  Cardiac pacemaker in situ   Follow-up with EP  as planned.   Dispo:  Return in about 6 months (around 07/16/2018) for Routine Follow Up, w/ Dr. Acie Fredrickson.   Medication Adjustments/Labs and Tests Ordered: Current medicines are reviewed at length with the patient today.  Concerns regarding medicines are outlined above.  Tests Ordered: No orders of the defined types were placed in this encounter.  Medication Changes: No orders of the defined types were placed in this encounter.   Signed, Richardson Dopp, PA-C  01/14/2018 1:19 PM    [facility] [address], [place], [place]  [address] Phone: [phone]; Fax: [phone]

## 2018-01-15 ENCOUNTER — Other Ambulatory Visit: Payer: Self-pay | Admitting: Physician Assistant

## 2018-01-25 DIAGNOSIS — H353132 Nonexudative age-related macular degeneration, bilateral, intermediate dry stage: Secondary | ICD-10-CM | POA: Diagnosis not present

## 2018-01-25 DIAGNOSIS — H25812 Combined forms of age-related cataract, left eye: Secondary | ICD-10-CM | POA: Diagnosis not present

## 2018-01-25 DIAGNOSIS — Z01818 Encounter for other preprocedural examination: Secondary | ICD-10-CM | POA: Diagnosis not present

## 2018-01-25 DIAGNOSIS — H2511 Age-related nuclear cataract, right eye: Secondary | ICD-10-CM | POA: Diagnosis not present

## 2018-01-30 DIAGNOSIS — D229 Melanocytic nevi, unspecified: Secondary | ICD-10-CM | POA: Diagnosis not present

## 2018-01-30 DIAGNOSIS — L819 Disorder of pigmentation, unspecified: Secondary | ICD-10-CM | POA: Diagnosis not present

## 2018-01-30 DIAGNOSIS — L570 Actinic keratosis: Secondary | ICD-10-CM | POA: Diagnosis not present

## 2018-01-30 DIAGNOSIS — L821 Other seborrheic keratosis: Secondary | ICD-10-CM | POA: Diagnosis not present

## 2018-01-30 DIAGNOSIS — D1801 Hemangioma of skin and subcutaneous tissue: Secondary | ICD-10-CM | POA: Diagnosis not present

## 2018-01-30 DIAGNOSIS — Z85828 Personal history of other malignant neoplasm of skin: Secondary | ICD-10-CM | POA: Diagnosis not present

## 2018-01-30 DIAGNOSIS — Z85820 Personal history of malignant melanoma of skin: Secondary | ICD-10-CM | POA: Diagnosis not present

## 2018-02-17 NOTE — Progress Notes (Signed)
Cardiology Office Note Date:  02/19/2018  Patient ID:  Benjamin [name] [birth year], MRN [id number] PCP:  Lajean Manes, MD  Cardiologist:  Dr. Acie Fredrickson Electrophysiology: Dr. Lovena Le   Chief Complaint: device visit  History of Present Illness: Benjamin [name] is a 72 y.o. male with history of CAD (DES to RCA and prox LCx in 2014), non-small cell lung ca (treated with surgery with a number of recurrences and repetitive XRT), AFib, CHB w/PPM, macular degeneration, baseline trace edema.  Most recently he saw S. Waever, PA late October this year, at that visit, he described baseline SOB, having trouble affording Eliquis having gotten into his doughnut hole. He comes today to be seen for dr. Lovena Le, last seen by him in June this year, doing well, no programming changes made.  He is doing well, no CP or SOB, occassionally feels some palpitations, no near syncope or syncope.  He denies any bleeding or signs of bleeding with the Eliquis, this remains expensive for him.    Device information MDT dual chamber PPM, RA lead is in the HIS port, RV lead is in RVA, implanted 08/29/16   Past Medical History:  Diagnosis Date  . Adenopathy    RIGHT ADRENAL  . Anxiety   . Arthritis    KNEES/HANDS  . CAD (coronary artery disease)    a. Prior known CTO of LCX with recanalizationby cath 2008;  b. 09/2012 NSTEMI/Cath/PCI: LM nl, LAD 50m, D1 50p, LCX 100p (2.75x16 Promus Premier DES), 80-37m, OM1 30, RCA 63m (3.0x16 Promus Premier DES), EF 50-55%.  [name] History of echocardiogram    Echo 2/18: Moderate LVH, EF 55-60, normal wall motion, trivial AI, moderate LAE, mild RVE, mild RAE, mildly elevated pulmonary pressure  . Hx of melanoma of skin   . 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 Non-small cell lung cancer ([place])    a. s/p resection and XRT.  [name] PAF (paroxysmal atrial fibrillation) ([place])    a. not currently on anticoagulation (09/2012)  . 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 ADRENALEXTOMY  02/2000  . CARDIAC CATHETERIZATION  2008   LAD 40, D1 OK, D2 50, CFX 90, RCA OK, EF 60%; Consider PCI PRN but the stenosis is quite complex and we would end up jailing several large posterolateral branches    . LEFT HEART CATHETERIZATION WITH CORONARY ANGIOGRAM N/A 10/17/2012   Procedure: LEFT HEART CATHETERIZATION WITH CORONARY ANGIOGRAM;  Surgeon: Sherren Mocha, MD;  Location: [facility] CATH LAB;  Service: Cardiovascular;  Laterality: N/A;  . LOBECTOMY LEFT UPPER LOBE  08/15/2001  . PACEMAKER IMPLANT N/A 08/29/2016   Procedure: Pacemaker Implant;  Surgeon: Evans Lance, MD;  Location: [place] CV LAB;  Service: Cardiovascular;  Laterality: N/A;  . Right VATS, mini thoracotomy, wedge resection of right upper  01/18/2004    Current Outpatient Medications  Medication Sig Dispense Refill  . apixaban (ELIQUIS) 5 MG TABS tablet Take 1 tablet (5 mg total) by mouth 2 (two) times daily. 60 tablet 11  . atorvastatin (LIPITOR) 20 MG tablet Take 20 mg by mouth every evening.     . clonazePAM (KLONOPIN) 1 MG tablet Take 1 mg by mouth 3 (three) times daily. 8am, 2pm, 8 pm    . metoprolol succinate (TOPROL-XL) 25 MG 24 hr tablet TAKE 1 TABLET(25 MG) BY MOUTH DAILY 90 tablet 3  . nitroGLYCERIN (NITROSTAT) 0.4 MG SL tablet Place 1 tablet (  0.4 mg total) under the tongue every 5 (five) minutes x 3 doses as needed for chest pain. 25 tablet 3   No current facility-administered medications for this visit.     Allergies:   Codeine   Social History:  The patient  reports that he quit smoking about 16 years ago. His smoking use included cigarettes. He has a 40.00 pack-year smoking history. He has never used smokeless tobacco. He reports that he does not drink alcohol or use drugs.   Family History:  The patient's family history includes Cancer in his cousin and mother; Heart attack in his father.  ROS:  Please see the history of present illness.    All other systems are reviewed and otherwise negative.   PHYSICAL EXAM:  VS:  BP 134/70   Pulse 68   Ht 5\' 11"  (1.803 m)   Wt 202 lb (91.6 kg)   SpO2 97%   BMI 28.17 kg/m  BMI: Body mass index is 28.17 kg/m. Well nourished, well developed, in no acute distress  HEENT: normocephalic, atraumatic  Neck: no JVD, carotid bruits or masses Cardiac:   RRR (paced); no significant murmurs, no rubs, or gallops Lungs: CTA b/l, no wheezing, rhonchi or rales  Abd: soft, nontender MS: no deformity or atrophy Ext: trace edema  Skin: warm and dry, no rash Neuro:  No gross deficits appreciated Psych: euthymic mood, full affect  PPM site is stable, no tethering or discomfort   EKG:  Not done today PPM interrogation done today and reviewed by [facility]: battery and lead measurements are good, 90% HIS pacing (<0.1% RV)   Echo 05/11/2016 Moderate LVH, EF 55-60, normal wall motion, trivial AI, moderate LAE, mild RV dilation, mild RAE, mild increased PASP  Echo 12/15 Mild LVH, EF 60-65, no RWMA, Gr 1 DD, mild AI  Myoview 5/15 Intermediate risk stress nuclear study with a scar in the basal and mid inferolateral walls and apical lateral wall with possible mild periinfarct ischemia. The interpretantion of the study is affected by significant extracardiac activity. LV Ejection Fraction: Study not gated.  LHC 7/14 LAD mid 50, D1 proximal 50 LCx proximal 100, AV groove diffuse 80-90; OM1 30 RCA mid hazy 90 EF 50-55 PCI: 3 x 16 mm Promus Premier DES to the mid RCA PCI: 2.75 x 16 mm Promus Premier DES to the proximal LCx   Recent Labs: 08/23/2017: BUN 13; Creatinine 1.01  No results found for requested labs within last 8760 hours.   CrCl cannot be calculated (Patient's most recent lab result is older than the maximum 21 days allowed.).   Wt Readings from Last 3 Encounters:  02/19/18 202 lb (91.6 kg)  01/14/18 203 lb 6.4 oz (92.3 kg)  09/07/17 205 lb (93 kg)     Other studies  reviewed: Additional studies/records reviewed today include: summarized above  ASSESSMENT AND PLAN:  1. PPM     Intact function, no changes made to his programming  2. permanent AFib     CHA2DS2Vasc is 2, on Eliquis  [facility] notes 11/20/17 Hgb 15, Creat 0.880  Eliquis is hard for him financially.  Our staff has given him the financial assist paperwork a couple times, he worries it will interfere with his discounted rate that he gets through his company.  He is encouraged to fill out the paperwork and see which will be cheaper for him.  Otherwise consider an alternative such as warfarin, but he this has been discussed before he states and would like to avoid  it, or any change given Eliquis seems to work well for him without issues..  3. CAD     No anginal complaints     On BB and statin tx,no ASA given eliquis     C/w Dr. Acie Fredrickson   Disposition: F/u with device clinic in 6 mo, Dr. Lovena Le or myself  in 1 year, sooner if needed  Current medicines are reviewed at length with the patient today.  The patient did not have any concerns regarding medicines.  Venetia Night, PA-C 02/19/2018 10:50 AM     [facility] [address] [place] [address] [phone] (office)  [phone] (fax)

## 2018-02-19 ENCOUNTER — Telehealth: Payer: Self-pay

## 2018-02-19 ENCOUNTER — Encounter: Payer: Self-pay | Admitting: Physician Assistant

## 2018-02-19 ENCOUNTER — Ambulatory Visit (INDEPENDENT_AMBULATORY_CARE_PROVIDER_SITE_OTHER): Payer: Medicare Other | Admitting: Physician Assistant

## 2018-02-19 VITALS — BP 134/70 | HR 68 | Ht 71.0 in | Wt 202.0 lb

## 2018-02-19 DIAGNOSIS — Z950 Presence of cardiac pacemaker: Secondary | ICD-10-CM

## 2018-02-19 DIAGNOSIS — I2510 Atherosclerotic heart disease of native coronary artery without angina pectoris: Secondary | ICD-10-CM

## 2018-02-19 DIAGNOSIS — I4821 Permanent atrial fibrillation: Secondary | ICD-10-CM

## 2018-02-19 DIAGNOSIS — I442 Atrioventricular block, complete: Secondary | ICD-10-CM

## 2018-02-19 NOTE — Telephone Encounter (Signed)
The pt was in the office this morning for a OV with Tommye Standard, PA-c and requested samples of Eliquis.  The pt comes to the office often (monthly is most cases) requesting samples of Eliquis.  I have discussed BMS Pt asst with him multiple times in the past and gave him up to 6 applications this year but he has not completed any of them.  He states that he has enough Eliquis samples to last him until the middle of December. I have advised him that that will give him enough time to complete his application, get his 9470 proof of income and his 2019 out of pocket expense report from his pharmacy and I did not give him any samples today.  I have again advised him to either contact [employer] about the asst they give him towards the cost of his medications (pt states that he is ineligible for pt asst due to this help) , pay for his Eliquis out of pocket or switch to Coumadin.  Please see the following phone note from 01/15/2017    The pt is advised that the office can no longer provide Eliquis samples to him as we have been since 05/02/16 when he started taking it. I offered to do a tier exception but he asked me not to because he is afraid it may interfere with the financial help that [employer] (the company he retired from) is giving him towards his medications.   I advised him that if he does not want me to do a tier exception, he does not need a PA, and he does not qualify for pt assistance his only other options would be switching to Coumadin or pay out of pocket for his Eliquis.  He states that he will call [employer] to find out if a tier exception will interfere with the financial help they give him. If so he states that he will pay for his Eliquis out of pocket and if not he will call me back and let me know it is ok to do a tier exception.

## 2018-02-19 NOTE — Patient Instructions (Addendum)
Medication Instructions:  Your physician recommends that you continue on your current medications as directed. Please refer to the Current Medication list given to you today.  If you need a refill on your cardiac medications before your next appointment, please call your pharmacy.   Lab work: NONE ORDERED  TODAY  If you have labs (blood work) drawn today and your tests are completely normal, you will receive your results only by: [name] MyChart Message (if you have MyChart) OR . A paper copy in the mail If you have any lab test that is abnormal or we need to change your treatment, we will call you to review the results.  Testing/Procedures: NONE ORDERED  TODAY   Follow-Up: At [facility], you and your health needs are our priority.  As part of our continuing mission to provide you with exceptional heart care, we have created designated Provider Care Teams.  These Care Teams include your primary Cardiologist (physician) and Advanced Practice Providers (APPs -  Physician Assistants and Nurse Practitioners) who all work together to provide you with the care you need, when you need it. You will need a follow up appointment in 1 years.  Please call our office 2 months in advance to schedule this appointment.  You may see Cristopher Peru, MD or one of the following Advanced Practice Providers on your designated Care Team:   Chanetta Marshall, NP . Tommye Standard, PA-C   Your physician wants you to follow-up in:  IN  [place]     Any Other Special Instructions Will Be Listed Below (If Applicable).

## 2018-02-26 ENCOUNTER — Telehealth: Payer: Self-pay | Admitting: *Deleted

## 2018-02-26 NOTE — Telephone Encounter (Signed)
CALLED PATIENT TO INFORM OF STAT LABS ON 03-05-18 -2:30 PM @ [name] AND HIS CT ON 03-05-18 - ARRIVAL TIME - 3:15 PM @ [facility], PT. TO BE NPO- 4 HRS. PRIOR TO TEST, SPOKE WITH PATIENT AND HE IS AWARE OF THESE APPTS.

## 2018-02-27 ENCOUNTER — Ambulatory Visit: Payer: Medicare Other | Admitting: Radiation Oncology

## 2018-03-05 ENCOUNTER — Ambulatory Visit (HOSPITAL_COMMUNITY)
Admission: RE | Admit: 2018-03-05 | Discharge: 2018-03-05 | Disposition: A | Discharge status: Home / Self Care | Payer: Medicare Other | Source: Ambulatory Visit | Attending: Radiation Oncology | Admitting: Radiation Oncology

## 2018-03-05 ENCOUNTER — Ambulatory Visit
Admission: RE | Admit: 2018-03-05 | Discharge: 2018-03-05 | Disposition: A | Discharge status: Home / Self Care | Payer: Medicare Other | Source: Ambulatory Visit | Attending: Radiation Oncology | Admitting: Radiation Oncology

## 2018-03-05 ENCOUNTER — Other Ambulatory Visit: Payer: Self-pay | Admitting: Radiation Oncology

## 2018-03-05 DIAGNOSIS — C3431 Malignant neoplasm of lower lobe, right bronchus or lung: Secondary | ICD-10-CM | POA: Diagnosis not present

## 2018-03-05 DIAGNOSIS — C3490 Malignant neoplasm of unspecified part of unspecified bronchus or lung: Secondary | ICD-10-CM | POA: Diagnosis not present

## 2018-03-05 DIAGNOSIS — C3402 Malignant neoplasm of left main bronchus: Secondary | ICD-10-CM | POA: Insufficient documentation

## 2018-03-05 LAB — CMP (CANCER CENTER ONLY)
ALT: 13 U/L (ref 0–44)
AST: 13 U/L — AB (ref 15–41)
Albumin: 3.9 g/dL (ref 3.5–5.0)
Alkaline Phosphatase: 73 U/L (ref 38–126)
Anion gap: 8 (ref 5–15)
BUN: 17 mg/dL (ref 8–23)
CHLORIDE: 106 mmol/L (ref 98–111)
CO2: 29 mmol/L (ref 22–32)
CREATININE: 0.94 mg/dL (ref 0.61–1.24)
Calcium: 8.9 mg/dL (ref 8.9–10.3)
Glucose, Bld: 85 mg/dL (ref 70–99)
POTASSIUM: 4.2 mmol/L (ref 3.5–5.1)
Sodium: 143 mmol/L (ref 135–145)
TOTAL PROTEIN: 6.6 g/dL (ref 6.5–8.1)
Total Bilirubin: 1 mg/dL (ref 0.3–1.2)

## 2018-03-05 MED ORDER — SODIUM CHLORIDE (PF) 0.9 % IJ SOLN
INTRAMUSCULAR | Status: AC
  Filled 2018-03-05: qty 50

## 2018-03-05 MED ORDER — IOHEXOL 300 MG/ML  SOLN
75.0000 mL | Freq: Once | INTRAMUSCULAR | Status: AC | PRN
  Administered 2018-03-05: 75 mL via INTRAVENOUS

## 2018-03-06 ENCOUNTER — Telehealth: Payer: Self-pay | Admitting: Radiation Oncology

## 2018-03-06 DIAGNOSIS — C3431 Malignant neoplasm of lower lobe, right bronchus or lung: Secondary | ICD-10-CM

## 2018-03-06 NOTE — Telephone Encounter (Signed)
I called the patient to review his recent CT scan results. The areas of increased dimension appear to be related to post radiotherapy changes. We will plan to follow up with him in 6 months repeat scan. He is in agreement with this plan.

## 2018-03-11 DIAGNOSIS — F411 Generalized anxiety disorder: Secondary | ICD-10-CM | POA: Diagnosis not present

## 2018-05-17 ENCOUNTER — Other Ambulatory Visit: Payer: Self-pay | Admitting: Cardiovascular Disease

## 2018-05-24 ENCOUNTER — Emergency Department (HOSPITAL_COMMUNITY): Payer: Medicare Other

## 2018-05-24 ENCOUNTER — Inpatient Hospital Stay (HOSPITAL_COMMUNITY)
Admission: EM | Admit: 2018-05-24 | Discharge: 2018-05-28 | DRG: 351 | Disposition: A | Discharge status: Home / Self Care | Payer: Medicare Other | Source: Ambulatory Visit | Attending: Surgery | Admitting: Surgery

## 2018-05-24 DIAGNOSIS — Z950 Presence of cardiac pacemaker: Secondary | ICD-10-CM

## 2018-05-24 DIAGNOSIS — Z87891 Personal history of nicotine dependence: Secondary | ICD-10-CM

## 2018-05-24 DIAGNOSIS — I4821 Permanent atrial fibrillation: Secondary | ICD-10-CM | POA: Diagnosis present

## 2018-05-24 DIAGNOSIS — K460 Unspecified abdominal hernia with obstruction, without gangrene: Secondary | ICD-10-CM

## 2018-05-24 DIAGNOSIS — F419 Anxiety disorder, unspecified: Secondary | ICD-10-CM | POA: Diagnosis present

## 2018-05-24 DIAGNOSIS — Z8249 Family history of ischemic heart disease and other diseases of the circulatory system: Secondary | ICD-10-CM

## 2018-05-24 DIAGNOSIS — I2510 Atherosclerotic heart disease of native coronary artery without angina pectoris: Secondary | ICD-10-CM | POA: Diagnosis present

## 2018-05-24 DIAGNOSIS — Z885 Allergy status to narcotic agent status: Secondary | ICD-10-CM

## 2018-05-24 DIAGNOSIS — K4030 Unilateral inguinal hernia, with obstruction, without gangrene, not specified as recurrent: Secondary | ICD-10-CM | POA: Diagnosis not present

## 2018-05-24 DIAGNOSIS — I442 Atrioventricular block, complete: Secondary | ICD-10-CM | POA: Diagnosis present

## 2018-05-24 DIAGNOSIS — I10 Essential (primary) hypertension: Secondary | ICD-10-CM | POA: Diagnosis present

## 2018-05-24 DIAGNOSIS — H3530 Unspecified macular degeneration: Secondary | ICD-10-CM | POA: Diagnosis present

## 2018-05-24 DIAGNOSIS — Z85820 Personal history of malignant melanoma of skin: Secondary | ICD-10-CM

## 2018-05-24 DIAGNOSIS — Z85118 Personal history of other malignant neoplasm of bronchus and lung: Secondary | ICD-10-CM

## 2018-05-24 DIAGNOSIS — I252 Old myocardial infarction: Secondary | ICD-10-CM

## 2018-05-24 DIAGNOSIS — K5909 Other constipation: Secondary | ICD-10-CM | POA: Diagnosis present

## 2018-05-24 DIAGNOSIS — Z801 Family history of malignant neoplasm of trachea, bronchus and lung: Secondary | ICD-10-CM

## 2018-05-24 DIAGNOSIS — N401 Enlarged prostate with lower urinary tract symptoms: Secondary | ICD-10-CM | POA: Diagnosis present

## 2018-05-24 DIAGNOSIS — Z79899 Other long term (current) drug therapy: Secondary | ICD-10-CM

## 2018-05-24 DIAGNOSIS — Z803 Family history of malignant neoplasm of breast: Secondary | ICD-10-CM

## 2018-05-24 DIAGNOSIS — R3911 Hesitancy of micturition: Secondary | ICD-10-CM | POA: Diagnosis present

## 2018-05-24 DIAGNOSIS — Z7901 Long term (current) use of anticoagulants: Secondary | ICD-10-CM

## 2018-05-24 DIAGNOSIS — E785 Hyperlipidemia, unspecified: Secondary | ICD-10-CM | POA: Diagnosis present

## 2018-05-24 DIAGNOSIS — Z955 Presence of coronary angioplasty implant and graft: Secondary | ICD-10-CM

## 2018-05-24 DIAGNOSIS — M1990 Unspecified osteoarthritis, unspecified site: Secondary | ICD-10-CM | POA: Diagnosis present

## 2018-05-24 LAB — CBC WITH DIFFERENTIAL/PLATELET
ABS IMMATURE GRANULOCYTES: 0.02 10*3/uL (ref 0.00–0.07)
BASOS ABS: 0 10*3/uL (ref 0.0–0.1)
Basophils Relative: 0 %
Eosinophils Absolute: 0 10*3/uL (ref 0.0–0.5)
Eosinophils Relative: 0 %
HCT: 49.5 % (ref 39.0–52.0)
HEMOGLOBIN: 16.2 g/dL (ref 13.0–17.0)
Immature Granulocytes: 0 %
LYMPHS ABS: 0.6 10*3/uL — AB (ref 0.7–4.0)
LYMPHS PCT: 6 %
MCH: 29.7 pg (ref 26.0–34.0)
MCHC: 32.7 g/dL (ref 30.0–36.0)
MCV: 90.8 fL (ref 80.0–100.0)
MONO ABS: 0.4 10*3/uL (ref 0.1–1.0)
Monocytes Relative: 4 %
NEUTROS ABS: 9.3 10*3/uL — AB (ref 1.7–7.7)
NRBC: 0 % (ref 0.0–0.2)
Neutrophils Relative %: 90 %
Platelets: 161 10*3/uL (ref 150–400)
RBC: 5.45 MIL/uL (ref 4.22–5.81)
RDW: 13.1 % (ref 11.5–15.5)
WBC: 10.4 10*3/uL (ref 4.0–10.5)

## 2018-05-24 LAB — COMPREHENSIVE METABOLIC PANEL
ALBUMIN: 4.4 g/dL (ref 3.5–5.0)
ALK PHOS: 82 U/L (ref 38–126)
ALT: 12 U/L (ref 0–44)
AST: 26 U/L (ref 15–41)
Anion gap: 11 (ref 5–15)
BUN: 13 mg/dL (ref 8–23)
CO2: 24 mmol/L (ref 22–32)
CREATININE: 0.94 mg/dL (ref 0.61–1.24)
Calcium: 9.1 mg/dL (ref 8.9–10.3)
Chloride: 103 mmol/L (ref 98–111)
GFR calc Af Amer: >60 mL/min (ref >60)
GFR calc non Af Amer: >60 mL/min (ref >60)
GLUCOSE: 121 mg/dL — AB (ref 70–99)
Potassium: 4.1 mmol/L (ref 3.5–5.1)
SODIUM: 138 mmol/L (ref 135–145)
TOTAL PROTEIN: 7.5 g/dL (ref 6.5–8.1)
Total Bilirubin: 1.5 mg/dL — ABNORMAL HIGH (ref 0.3–1.2)

## 2018-05-24 LAB — I-STAT TROPONIN, ED: Troponin i, poc: 0.01 ng/mL (ref 0.00–0.08)

## 2018-05-24 LAB — LIPASE, BLOOD: Lipase: 27 U/L (ref 11–51)

## 2018-05-24 MED ORDER — ONDANSETRON 4 MG PO TBDP: 4.0000 mg | ORAL_TABLET | Freq: Four times a day (QID) | ORAL | Status: DC | PRN

## 2018-05-24 MED ORDER — HYDRALAZINE HCL 20 MG/ML IJ SOLN: 10.0000 mg | INTRAMUSCULAR | Status: DC | PRN

## 2018-05-24 MED ORDER — OXYCODONE HCL 5 MG PO TABS: 5.0000 mg | ORAL_TABLET | ORAL | Status: DC | PRN

## 2018-05-24 MED ORDER — ONDANSETRON HCL 4 MG/2ML IJ SOLN
4.0000 mg | Freq: Once | INTRAMUSCULAR | Status: AC
  Administered 2018-05-24: 4 mg via INTRAVENOUS
  Filled 2018-05-24: qty 2

## 2018-05-24 MED ORDER — METOPROLOL TARTRATE 5 MG/5ML IV SOLN: 5.0000 mg | Freq: Four times a day (QID) | INTRAVENOUS | Status: DC | PRN

## 2018-05-24 MED ORDER — HYDROMORPHONE HCL 1 MG/ML IJ SOLN
1.0000 mg | Freq: Once | INTRAMUSCULAR | Status: AC
  Administered 2018-05-24: 1 mg via INTRAVENOUS
  Filled 2018-05-24: qty 1

## 2018-05-24 MED ORDER — IOHEXOL 300 MG/ML  SOLN
100.0000 mL | Freq: Once | INTRAMUSCULAR | Status: AC | PRN
  Administered 2018-05-24: 100 mL via INTRAVENOUS

## 2018-05-24 MED ORDER — POLYETHYL GLYCOL-PROPYL GLYCOL 0.4-0.3 % OP GEL
Freq: Every day | OPHTHALMIC | Status: DC | PRN
  Filled 2018-05-24: qty 10

## 2018-05-24 MED ORDER — SODIUM CHLORIDE 0.9 % IV SOLN
INTRAVENOUS | Status: DC
  Administered 2018-05-24 – 2018-05-27 (×6): via INTRAVENOUS

## 2018-05-24 MED ORDER — ONDANSETRON HCL 4 MG/2ML IJ SOLN
4.0000 mg | Freq: Four times a day (QID) | INTRAMUSCULAR | Status: DC | PRN
  Administered 2018-05-24: 4 mg via INTRAVENOUS
  Filled 2018-05-24: qty 2

## 2018-05-24 MED ORDER — METHOCARBAMOL 1000 MG/10ML IJ SOLN
500.0000 mg | Freq: Four times a day (QID) | INTRAVENOUS | Status: DC | PRN
  Filled 2018-05-24 (×2): qty 5

## 2018-05-24 MED ORDER — ACETAMINOPHEN 650 MG RE SUPP: 650.0000 mg | Freq: Four times a day (QID) | RECTAL | Status: DC | PRN

## 2018-05-24 MED ORDER — METOPROLOL SUCCINATE ER 25 MG PO TB24
25.0000 mg | ORAL_TABLET | Freq: Every day | ORAL | Status: DC
  Administered 2018-05-25 – 2018-05-28 (×4): 25 mg via ORAL
  Filled 2018-05-24 (×4): qty 1

## 2018-05-24 MED ORDER — HYDROMORPHONE HCL 1 MG/ML IJ SOLN
0.5000 mg | INTRAMUSCULAR | Status: DC | PRN
  Administered 2018-05-24: 0.5 mg via INTRAVENOUS
  Filled 2018-05-24: qty 1

## 2018-05-24 MED ORDER — DIPHENHYDRAMINE HCL 12.5 MG/5ML PO ELIX: 12.5000 mg | ORAL_SOLUTION | Freq: Four times a day (QID) | ORAL | Status: DC | PRN

## 2018-05-24 MED ORDER — CLONAZEPAM 1 MG PO TABS
1.0000 mg | ORAL_TABLET | Freq: Three times a day (TID) | ORAL | Status: DC
  Administered 2018-05-24 – 2018-05-28 (×11): 1 mg via ORAL
  Filled 2018-05-24 (×4): qty 1
  Filled 2018-05-24: qty 2
  Filled 2018-05-24 (×6): qty 1

## 2018-05-24 MED ORDER — DIPHENHYDRAMINE HCL 50 MG/ML IJ SOLN: 12.5000 mg | Freq: Four times a day (QID) | INTRAMUSCULAR | Status: DC | PRN

## 2018-05-24 MED ORDER — ACETAMINOPHEN 325 MG PO TABS
650.0000 mg | ORAL_TABLET | Freq: Four times a day (QID) | ORAL | Status: DC | PRN
  Administered 2018-05-25: 650 mg via ORAL
  Filled 2018-05-24: qty 2

## 2018-05-24 NOTE — ED Triage Notes (Signed)
Pt arrives to ED for treatment of an incarcerated hernia. Pt placed in position of comfort with call bell in reach, bed locked and lowered.

## 2018-05-24 NOTE — H&P (Addendum)
Surgical H&P  CC: incarcerated inguinal hernia  HPI: Very nice 73yo man with history of a fib on Eliquis (last dose today at 8am), multiple prior lung cancers, anxiety, coronary artery disease, pacemaker in place, and history of adrenalectomy who presented to the emergency room this evening with an incarcerated right inguinal hernia.  He has known about this hernia since about June of last year.  It has gradually increased in size, and has always been reducible.  He states that it will protrude when he is standing, and that he will typically lie down and be able to reduce the hernia quite easily.  This morning he had more difficulty reducing it, and then afterwards it re-herniated and he has not been able to reduce it since.  He saw his primary care doctor who was also unable to reduce it and then presented to the emergency room.  He denies any fevers or vomiting, and reports pain locally at the site of the hernia.  He does report tremulousness and chills.  Allergies  Allergen Reactions  . Codeine Other (See Comments)    Dizziness, stomach pain    Past Medical History:  Diagnosis Date  . Adenopathy    RIGHT ADRENAL  . Anxiety   . Arthritis    KNEES/HANDS  . CAD (coronary artery disease)    a. Prior known CTO of LCX with recanalizationby cath 2008;  b. 09/2012 NSTEMI/Cath/PCI: LM nl, LAD 49m, D1 50p, LCX 100p (2.75x16 Promus Premier DES), 80-24m, OM1 30, RCA 66m (3.0x16 Promus Premier DES), EF 50-55%.  [name] History of echocardiogram    Echo 2/18: Moderate LVH, EF 55-60, normal wall motion, trivial AI, moderate LAE, mild RVE, mild RAE, mildly elevated pulmonary pressure  . Hx of melanoma of skin   . 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 Non-small cell lung cancer ([place])    a. s/p resection and XRT.  [name] PAF (paroxysmal atrial fibrillation) ([place])    a. not currently on anticoagulation (09/2012)  . 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 ADRENALEXTOMY  02/2000  . CARDIAC CATHETERIZATION  2008   LAD 40, D1 OK, D2 50, CFX 90, RCA OK, EF 60%; Consider PCI PRN but the stenosis is quite complex and we would end up jailing several large posterolateral branches    . LEFT HEART CATHETERIZATION WITH CORONARY ANGIOGRAM N/A 10/17/2012   Procedure: LEFT HEART CATHETERIZATION WITH CORONARY ANGIOGRAM;  Surgeon: Sherren Mocha, MD;  Location: [facility] CATH LAB;  Service: Cardiovascular;  Laterality: N/A;  . LOBECTOMY LEFT UPPER LOBE  08/15/2001  . PACEMAKER IMPLANT N/A 08/29/2016   Procedure: Pacemaker Implant;  Surgeon: Evans Lance, MD;  Location: [facility] CV LAB;  Service: Cardiovascular;  Laterality: N/A;  . Right VATS, mini thoracotomy, wedge resection of right upper  01/18/2004    Family History  Problem Relation Age of Onset  . Heart attack Father   . Cancer Mother        lung  . Cancer Cousin        breast    Social History   Socioeconomic History  . Marital status: Divorced    Spouse name: Not on file  . Number of children: Not on file  . Years of education: Not on file  . Highest education level: Not on file  Occupational History  . Occupation: Retired    Fish farm manager: [employer]  . Financial  resource strain: Not on file  . Food insecurity:    Worry: Not on file    Inability: Not on file  . Transportation needs:    Medical: Not on file    Non-medical: Not on file  Tobacco Use  . Smoking status: Former Smoker    Packs/day: 1.00    Years: 40.00    Pack years: 40.00    Types: Cigarettes    Last attempt to quit: 08/15/2001    Years since quitting: 16.7  . Smokeless tobacco: Never Used  Substance and Sexual Activity  . Alcohol use: No  . Drug use: No  . Sexual activity: Not Currently  Lifestyle  . Physical activity:    Days per week: Not on file    Minutes per session: Not on file  . Stress: Not on file  Relationships  . Social connections:    Talks on phone: Not on  file    Gets together: Not on file    Attends religious service: Not on file    Active member of club or organization: Not on file    Attends meetings of clubs or organizations: Not on file    Relationship status: Not on file  Other Topics Concern  . Not on file  Social History Narrative  . Not on file    No current facility-administered medications on file prior to encounter.    Current Outpatient Medications on File Prior to Encounter  Medication Sig Dispense Refill  . atorvastatin (LIPITOR) 20 MG tablet Take 20 mg by mouth every evening.     . clonazePAM (KLONOPIN) 1 MG tablet Take 1 mg by mouth 3 (three) times daily. 8am, 2pm, 8 pm    . ELIQUIS 5 MG TABS tablet TAKE 1 TABLET(5 MG) BY MOUTH TWICE DAILY (Patient taking differently: Take 5 mg by mouth 2 (two) times daily. ) 60 tablet 11  . metoprolol succinate (TOPROL-XL) 25 MG 24 hr tablet TAKE 1 TABLET(25 MG) BY MOUTH DAILY (Patient taking differently: Take 25 mg by mouth daily after breakfast. ) 90 tablet 3  . Multiple Vitamins-Minerals (PRESERVISION AREDS 2) CAPS Take 1 capsule by mouth 2 (two) times daily.    . nitroGLYCERIN (NITROSTAT) 0.4 MG SL tablet Place 1 tablet (0.4 mg total) under the tongue every 5 (five) minutes x 3 doses as needed for chest pain. 25 tablet 3  . Polyethyl Glycol-Propyl Glycol (SYSTANE OP) Place 1 drop into both eyes daily as needed (dry eyes).      Review of Systems: a complete, 10pt review of systems was completed with pertinent positives and negatives as documented in the HPI  Physical Exam: Vitals:   05/24/18 1915 05/24/18 1945  BP: (!) 158/87 (!) 154/73  Pulse:    Resp:    SpO2:     Gen: A&Ox3, no distress  Head: normocephalic, atraumatic Eyes: extraocular motions intact, anicteric.  Neck: supple without mass or thyromegaly Chest: unlabored respirations, symmetrical air entry, clear bilaterally   Cardiovascular: RRR with palpable distal pulses, no pedal edema Abdomen: soft, nondistended,  nontender. No mass or organomegaly.  There is a hernia in the right groin.  I was able to partially reduce this but not completely.  There is tenderness in the region of the hernia, but he tolerated pretty aggressive manipulation.  No overlying skin changes. Extremities: warm, without edema, no deformities  Neuro: grossly intact Psych: appropriate mood and affect, normal insight  Skin: warm and dry   CBC Latest Ref Rng & Units  05/24/2018 08/23/2016 06/13/2016  WBC 4.0 - 10.5 K/uL 10.4 9.6 6.2  Hemoglobin 13.0 - 17.0 g/dL 16.2 15.4 15.8  Hematocrit 39.0 - 52.0 % 49.5 46.6 46.8  Platelets 150 - 400 K/uL 161 141(L) 142(L)    CMP Latest Ref Rng & Units 05/24/2018 03/05/2018 08/23/2017  Glucose 70 - 99 mg/dL 121(H) 85 -  BUN 8 - 23 mg/dL 13 17 13   Creatinine 0.61 - 1.24 mg/dL 0.94 0.94 1.01  Sodium 135 - 145 mmol/L 138 143 -  Potassium 3.5 - 5.1 mmol/L 4.1 4.2 -  Chloride 98 - 111 mmol/L 103 106 -  CO2 22 - 32 mmol/L 24 29 -  Calcium 8.9 - 10.3 mg/dL 9.1 8.9 -  Total Protein 6.5 - 8.1 g/dL 7.5 6.6 -  Total Bilirubin 0.3 - 1.2 mg/dL 1.5(H) 1.0 -  Alkaline Phos 38 - 126 U/L 82 73 -  AST 15 - 41 U/L 26 13(L) -  ALT 0 - 44 U/L 12 13 -    Lab Results  Component Value Date   INR 1.11 08/24/2015   INR 1.08 10/16/2012   INR 1.0 04/24/2007    Imaging: Ct Abdomen Pelvis W Contrast  Result Date: 05/24/2018 CLINICAL DATA:  Hernia EXAM: CT ABDOMEN AND PELVIS WITH CONTRAST TECHNIQUE: Multidetector CT imaging of the abdomen and pelvis was performed using the standard protocol following bolus administration of intravenous contrast. CONTRAST:  124mL OMNIPAQUE IOHEXOL 300 MG/ML  SOLN COMPARISON:  PET CT 05/17/2017, CT 10/16/2012 FINDINGS: Lower chest: Lung bases demonstrate irregular nodule with surrounding ground-glass density at the right lower lobe, this appears increased as compared with prior PET-CT. This is incompletely visualized. No pleural effusion. Subpleural calcification in the posterior right  base. Borderline cardiomegaly. Intracardiac pacing leads. Mild distal esophageal thickening. Hepatobiliary: No focal liver abnormality is seen. No gallstones, gallbladder wall thickening, or biliary dilatation. Pancreas: Unremarkable. No pancreatic ductal dilatation or surrounding inflammatory changes. Spleen: Normal in size without focal abnormality. Adrenals/Urinary Tract: Left adrenal gland is normal. Surgical clips in the region of right adrenal gland. No hydronephrosis. The bladder is normal. Stomach/Bowel: The stomach is nonenlarged. No dilated small bowel. A portion of the sigmoid colon is now contained within a right inguinal hernia. There is mild wall thickening of the bowel within the hernia sac. Vascular/Lymphatic: Nonaneurysmal aorta. Moderate aortic atherosclerosis. No significantly enlarged lymph nodes Reproductive: Enlarged prostate gland Other: No free air. No significant free fluid. Interval right inguinal hernia containing moderate to large amount of fat and a portion of the sigmoid colon. There is mesenteric vascular congestion within the hernia sac. The bowel that is within the sac demonstrates edema and wall thickening. There is moderate fluid in the hernia sac. Musculoskeletal: Advanced degenerative change at L4-L5 with 11 mm anterolisthesis of L4 on L5 and chronic appearing bilateral pars defect. IMPRESSION: 1. Interval finding of moderate right inguinal hernia, now containing mesenteric fat and a portion of the sigmoid colon. The sigmoid colon contained within the hernia demonstrates wall thickening. There is edema and moderate fluid within the hernia sac in addition to mesenteric vascular congestion, all consistent with incarcerated hernia. There is no obstruction related to the hernia. 2. Irregular mass with surrounding ground-glass density in the right lower lobe, incompletely visualized. 3. Prior right adrenalectomy 4. Prostatomegaly Electronically Signed   By: Donavan Foil M.D.   On:  05/24/2018 20:28     A/P: 73 year old gentleman with an incarcerated right inguinal hernia.  On review of the CT scan, there is  actually sigmoid colon within the hernia which shows some edema.  No obstruction.  His labs are unremarkable, no leukocytosis, acidosis or acute kidney injury and while the incarcerated hernia is tender he was able to tolerate pretty aggressive pressure and manipulation while I tried to reduce it.  Currently there are no signs of bowel compromise or ischemia.  We will plan to admit this evening for bowel rest and symptom management and anticipate repair of the hernia this admission.  Potentially tomorrow, that would allow at least 24 hours off of the Eliquis.   If he develops signs or symptoms of bowel compromise, will need to get Kcentra and proceed to the OR emergently.  Discussed that the hernia repair would entail a groin incision, inspection of the bowel and then repair of the hernia.  If there is any bowel compromise at the time of surgery, patient may require laparotomy and bowel resection.  We discussed risks of surgery including bleeding, infection, pain, scarring, injury to structures in the area which we discussed, hernia recurrence, chronic pain, as well as systemic risks including acute cardiac event or arrhythmia, pneumonia, stroke, DVT/PE, death.  Questions were welcomed and answered to the patient's satisfaction.   Romana Juniper, MD [facility], [place] Pager [phone]

## 2018-05-24 NOTE — ED Provider Notes (Signed)
[facility] Provider Note   CSN: [id number] Arrival date & time: 05/24/18  1720    History   Chief Complaint Chief Complaint  Patient presents with  . Hernia    HPI Benjamin [name] is a 73 y.o. male with history of CAD status post stents, non-small cell lung cancer status post surgery, multiple recurrences and chemotherapy, atrial fibrillation on Eliquis, complete heart block status post pacemaker is here for evaluation of painful hernia.  Patient went to see primary care doctor at [facility] who was unable to reduce the hernia and referred him to the ER.  Patient reports having right inguinal hernia for several months.  Typically the hernia is small and minimally tender.  Usually, when he feels it bulge out he lays down and is able to apply pressure and self reduce it.  He woke up this morning with bulging hernia that was much larger and very painful, it felt firm.  He was able to reduce it once but then it protruded again and was unable to self reduce it so he went to his primary care doctor.  He has associated nausea, lower abdominal pain.  He denies fever, vomiting.  Has chronic constipation and often has to strain to have a BM.  Last BM was yesterday morning.  He has not passed flatus today.  Chronic urinary hesitancy unchanged, no dysuria, hematuria, scrotal swelling.  No abdominal surgeries.     HPI  Past Medical History:  Diagnosis Date  . Adenopathy    RIGHT ADRENAL  . Anxiety   . Arthritis    KNEES/HANDS  . CAD (coronary artery disease)    a. Prior known CTO of LCX with recanalizationby cath 2008;  b. 09/2012 NSTEMI/Cath/PCI: LM nl, LAD 34m, D1 50p, LCX 100p (2.75x16 Promus Premier DES), 80-70m, OM1 30, RCA 36m (3.0x16 Promus Premier DES), EF 50-55%.  [name] History of echocardiogram    Echo 2/18: Moderate LVH, EF 55-60, normal wall motion, trivial AI, moderate LAE, mild RVE, mild RAE, mildly elevated pulmonary pressure  . Hx of melanoma of skin     . 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 Non-small cell lung cancer ([place])    a. s/p resection and XRT.  [name] PAF (paroxysmal atrial fibrillation) ([place])    a. not currently on anticoagulation (09/2012)  . 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 Incarcerated inguinal hernia 05/24/2018  . Complete heart block ([place]) 08/29/2016  . 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 Pneumothorax after biopsy 08/24/2015  . Hyperlipidemia 09/14/2014  . Mobitz type 1 second degree atrioventricular block 10/17/2012  . Hx of NSTEMI tx with DES to RCA and DES to LCx in 2014 10/16/2012  . Chest pain, atypical 07/02/2012  . Malignant neoplasm of hilus of lung ([place])   . Adenopathy   . Anxiety   . Arthritis   . Hx of melanoma of skin   . Permanent atrial fibrillation   . CAD (coronary artery disease)     Past Surgical History:  Procedure Laterality Date  . ADRENALEXTOMY  02/2000  . CARDIAC CATHETERIZATION  2008   LAD 40, D1 OK, D2 50, CFX 90, RCA OK, EF 60%; Consider PCI PRN but the stenosis is quite complex and we would end up jailing several large posterolateral branches    . LEFT HEART CATHETERIZATION WITH CORONARY ANGIOGRAM  N/A 10/17/2012   Procedure: LEFT HEART CATHETERIZATION WITH CORONARY ANGIOGRAM;  Surgeon: Sherren Mocha, MD;  Location: [facility] CATH LAB;  Service: Cardiovascular;  Laterality: N/A;  . LOBECTOMY LEFT UPPER LOBE  08/15/2001  . PACEMAKER IMPLANT N/A 08/29/2016   Procedure: Pacemaker Implant;  Surgeon: Evans Lance, MD;  Location: [facility] CV LAB;  Service: Cardiovascular;  Laterality: N/A;  . Right VATS, mini thoracotomy, wedge resection of right upper  01/18/2004        Home Medications    Prior to Admission medications   Medication Sig Start Date End Date Taking? Authorizing Provider  atorvastatin (LIPITOR) 20 MG tablet Take 20 mg by mouth every evening.    Yes  [provider]  clonazePAM (KLONOPIN) 1 MG tablet Take 1 mg by mouth 3 (three) times daily. 8am, 2pm, 8 pm   Yes [provider]  ELIQUIS 5 MG TABS tablet TAKE 1 TABLET(5 MG) BY MOUTH TWICE DAILY Patient taking differently: Take 5 mg by mouth 2 (two) times daily.  05/20/18  Yes Evans Lance, MD  metoprolol succinate (TOPROL-XL) 25 MG 24 hr tablet TAKE 1 TABLET(25 MG) BY MOUTH DAILY Patient taking differently: Take 25 mg by mouth daily after breakfast.  01/15/18  Yes Weaver, Scott T, PA-C  Multiple Vitamins-Minerals (PRESERVISION AREDS 2) CAPS Take 1 capsule by mouth 2 (two) times daily.   Yes [provider]  nitroGLYCERIN (NITROSTAT) 0.4 MG SL tablet Place 1 tablet (0.4 mg total) under the tongue every 5 (five) minutes x 3 doses as needed for chest pain. 10/18/12  Yes Theora Gianotti, NP  Polyethyl Glycol-Propyl Glycol (SYSTANE OP) Place 1 drop into both eyes daily as needed (dry eyes).   Yes [provider]    Family History Family History  Problem Relation Age of Onset  . Heart attack Father   . Cancer Mother        lung  . Cancer Cousin        breast    Social History Social History   Tobacco Use  . Smoking status: Former Smoker    Packs/day: 1.00    Years: 40.00    Pack years: 40.00    Types: Cigarettes    Last attempt to quit: 08/15/2001    Years since quitting: 16.7  . Smokeless tobacco: Never Used  Substance Use Topics  . Alcohol use: No  . Drug use: No     Allergies   Codeine   Review of Systems Review of Systems  Gastrointestinal: Positive for abdominal pain and nausea.       Hernia   All other systems reviewed and are negative.    Physical Exam Updated Vital Signs BP (!) 154/73   Pulse (!) 59   Resp 19   SpO2 97%   Physical Exam Vitals signs and nursing note reviewed.  Constitutional:      Appearance: He is well-developed.     Comments: Non toxic.  No significant distress, pain noted with sitting up  on the bed and repositioning.  HENT:     Head: Normocephalic and atraumatic.     Nose: Nose normal.  Eyes:     Conjunctiva/sclera: Conjunctivae normal.     Pupils: Pupils are equal, round, and reactive to light.  Neck:     Musculoskeletal: Normal range of motion.  Cardiovascular:     Rate and Rhythm: Normal rate and regular rhythm.     Heart sounds: Normal heart sounds.  Pulmonary:  Effort: Pulmonary effort is normal.     Breath sounds: Normal breath sounds.  Abdominal:     General: Bowel sounds are normal.     Palpations: Abdomen is soft.     Tenderness: There is abdominal tenderness. There is guarding.     Hernia: A hernia is present.     Comments: Tennis ball sized bulging to the right groin above inguinal crease, firm.  Question slight purple discoloration to the overlying skin.  Significant pain with mild pressure.  Diffuse lower abdominal and suprapubic tenderness with guarding.  Active bowel sounds to lower quadrants.  Negative Murphy's.  No CVA tenderness.  Palpable femoral pulses, symmetric bilaterally.  Musculoskeletal: Normal range of motion.  Skin:    General: Skin is warm and dry.     Capillary Refill: Capillary refill takes less than 2 seconds.  Neurological:     Mental Status: He is alert and oriented to person, place, and time.  Psychiatric:        Behavior: Behavior normal.        Thought Content: Thought content normal.        Judgment: Judgment normal.      ED Treatments / Results  Labs (all labs ordered are listed, but only abnormal results are displayed) Labs Reviewed  CBC WITH DIFFERENTIAL/PLATELET - Abnormal; Notable for the following components:      Result Value   Neutro Abs 9.3 (*)    Lymphs Abs 0.6 (*)    All other components within normal limits  COMPREHENSIVE METABOLIC PANEL - Abnormal; Notable for the following components:   Glucose, Bld 121 (*)    Total Bilirubin 1.5 (*)    All other components within normal limits  LIPASE, BLOOD    URINALYSIS, ROUTINE W REFLEX MICROSCOPIC  URINALYSIS, COMPLETE (UACMP) WITH MICROSCOPIC  LACTIC ACID, PLASMA  COMPREHENSIVE METABOLIC PANEL  CBC  APTT  PROTIME-INR  I-STAT TROPONIN, ED    EKG None  Radiology Ct Abdomen Pelvis W Contrast  Result Date: 05/24/2018 CLINICAL DATA:  Hernia EXAM: CT ABDOMEN AND PELVIS WITH CONTRAST TECHNIQUE: Multidetector CT imaging of the abdomen and pelvis was performed using the standard protocol following bolus administration of intravenous contrast. CONTRAST:  176mL OMNIPAQUE IOHEXOL 300 MG/ML  SOLN COMPARISON:  PET CT 05/17/2017, CT 10/16/2012 FINDINGS: Lower chest: Lung bases demonstrate irregular nodule with surrounding ground-glass density at the right lower lobe, this appears increased as compared with prior PET-CT. This is incompletely visualized. No pleural effusion. Subpleural calcification in the posterior right base. Borderline cardiomegaly. Intracardiac pacing leads. Mild distal esophageal thickening. Hepatobiliary: No focal liver abnormality is seen. No gallstones, gallbladder wall thickening, or biliary dilatation. Pancreas: Unremarkable. No pancreatic ductal dilatation or surrounding inflammatory changes. Spleen: Normal in size without focal abnormality. Adrenals/Urinary Tract: Left adrenal gland is normal. Surgical clips in the region of right adrenal gland. No hydronephrosis. The bladder is normal. Stomach/Bowel: The stomach is nonenlarged. No dilated small bowel. A portion of the sigmoid colon is now contained within a right inguinal hernia. There is mild wall thickening of the bowel within the hernia sac. Vascular/Lymphatic: Nonaneurysmal aorta. Moderate aortic atherosclerosis. No significantly enlarged lymph nodes Reproductive: Enlarged prostate gland Other: No free air. No significant free fluid. Interval right inguinal hernia containing moderate to large amount of fat and a portion of the sigmoid colon. There is mesenteric vascular congestion  within the hernia sac. The bowel that is within the sac demonstrates edema and wall thickening. There is moderate fluid in the hernia  sac. Musculoskeletal: Advanced degenerative change at L4-L5 with 11 mm anterolisthesis of L4 on L5 and chronic appearing bilateral pars defect. IMPRESSION: 1. Interval finding of moderate right inguinal hernia, now containing mesenteric fat and a portion of the sigmoid colon. The sigmoid colon contained within the hernia demonstrates wall thickening. There is edema and moderate fluid within the hernia sac in addition to mesenteric vascular congestion, all consistent with incarcerated hernia. There is no obstruction related to the hernia. 2. Irregular mass with surrounding ground-glass density in the right lower lobe, incompletely visualized. 3. Prior right adrenalectomy 4. Prostatomegaly Electronically Signed   By: Donavan Foil M.D.   On: 05/24/2018 20:28    Procedures Procedures (including critical care time)  Medications Ordered in ED Medications  0.9 %  sodium chloride infusion (has no administration in time range)  acetaminophen (TYLENOL) tablet 650 mg (has no administration in time range)    Or  acetaminophen (TYLENOL) suppository 650 mg (has no administration in time range)  oxyCODONE (Oxy IR/ROXICODONE) immediate release tablet 5-10 mg (has no administration in time range)  HYDROmorphone (DILAUDID) injection 0.5 mg (has no administration in time range)  methocarbamol (ROBAXIN) 500 mg in dextrose 5 % 50 mL IVPB (has no administration in time range)  diphenhydrAMINE (BENADRYL) 12.5 MG/5ML elixir 12.5 mg (has no administration in time range)    Or  diphenhydrAMINE (BENADRYL) injection 12.5 mg (has no administration in time range)  ondansetron (ZOFRAN-ODT) disintegrating tablet 4 mg (has no administration in time range)    Or  ondansetron (ZOFRAN) injection 4 mg (has no administration in time range)  metoprolol tartrate (LOPRESSOR) injection 5 mg (has no  administration in time range)  hydrALAZINE (APRESOLINE) injection 10 mg (has no administration in time range)  clonazePAM (KLONOPIN) tablet 1 mg (has no administration in time range)  metoprolol succinate (TOPROL-XL) 24 hr tablet 25 mg (has no administration in time range)  polyethylene glycol 0.4% and propylene glycol 0.3% (SYSTANE) ophthalmic gel (has no administration in time range)  HYDROmorphone (DILAUDID) injection 1 mg (1 mg Intravenous Given 05/24/18 1743)  ondansetron (ZOFRAN) injection 4 mg (4 mg Intravenous Given 05/24/18 1743)  HYDROmorphone (DILAUDID) injection 1 mg (1 mg Intravenous Given 05/24/18 1951)  iohexol (OMNIPAQUE) 300 MG/ML solution 100 mL (100 mLs Intravenous Contrast Given 05/24/18 1939)     Initial Impression / Assessment and Plan / ED Course  I have reviewed the triage vital signs and the nursing notes.  Pertinent labs & imaging results that were available during my care of the patient were reviewed by me and considered in my medical decision making (see chart for details).  Clinical Course as of May 23 2198  Fri May 24, 2018  1920 Re-evaluated pt after dilaudid. Feels slightly better. Attempted reduction of hernia but pt did not tolerate this, asked me to stop with light pressure.  Pending CTAP   [CG]  2055 IMPRESSION: 1. Interval finding of moderate right inguinal hernia, now containing mesenteric fat and a portion of the sigmoid colon. The sigmoid colon contained within the hernia demonstrates wall thickening. There is edema and moderate fluid within the hernia sac in addition to mesenteric vascular congestion, all consistent with incarcerated hernia. There is no obstruction related to the hernia. 2. Irregular mass with surrounding ground-glass density in the right lower lobe, incompletely visualized. 3. Prior right adrenalectomy 4. Prostatomegaly  CT ABDOMEN PELVIS W CONTRAST [CG]    Clinical Course User Index [CG] Kinnie Feil, PA-C  ddx  includes hernia complication.  Labs, UA, CTAP.  2055: CT confirms incarcerated hernia.  Larger, irregular nodule to the right lower lobe concerning for progression of lung cancer.  Consult general surgery.  2200: Dr Kae Heller has admitted pt. Likely OR tomorrow.  Final Clinical Impressions(s) / ED Diagnoses   Final diagnoses:  Incarcerated hernia    ED Discharge Orders    None       Arlean Hopping 05/24/18 2200    Julianne Rice, MD 05/31/18 [phone]

## 2018-05-24 NOTE — Progress Notes (Signed)
Received patient from ED, AOx4, VSS with elevated BP at 159/94 d/t pain at 5/10 but refused PRN pain medication at this time, oriented to room, bed control, call light and plan of care. Pre-op procedure done.  Patient now resting on bed comfortably.  Will monitor.

## 2018-05-25 ENCOUNTER — Other Ambulatory Visit: Payer: Self-pay

## 2018-05-25 LAB — CBC
HCT: 43 % (ref 39.0–52.0)
Hemoglobin: 14.1 g/dL (ref 13.0–17.0)
MCH: 29.5 pg (ref 26.0–34.0)
MCHC: 32.8 g/dL (ref 30.0–36.0)
MCV: 90 fL (ref 80.0–100.0)
PLATELETS: 136 10*3/uL — AB (ref 150–400)
RBC: 4.78 MIL/uL (ref 4.22–5.81)
RDW: 13 % (ref 11.5–15.5)
WBC: 8.8 10*3/uL (ref 4.0–10.5)
nRBC: 0 % (ref 0.0–0.2)

## 2018-05-25 LAB — URINALYSIS, ROUTINE W REFLEX MICROSCOPIC
Bilirubin Urine: NEGATIVE
GLUCOSE, UA: NEGATIVE mg/dL
Hgb urine dipstick: NEGATIVE
Ketones, ur: 20 mg/dL — AB
LEUKOCYTE UA: NEGATIVE
NITRITE: NEGATIVE
Protein, ur: NEGATIVE mg/dL
Specific Gravity, Urine: >1.046 — ABNORMAL HIGH (ref 1.005–1.030)
pH: 5 (ref 5.0–8.0)

## 2018-05-25 LAB — PROTIME-INR
INR: 1.3 — ABNORMAL HIGH (ref 0.8–1.2)
Prothrombin Time: 15.8 seconds — ABNORMAL HIGH (ref 11.4–15.2)

## 2018-05-25 LAB — COMPREHENSIVE METABOLIC PANEL
ALK PHOS: 61 U/L (ref 38–126)
ALT: 14 U/L (ref 0–44)
AST: 16 U/L (ref 15–41)
Albumin: 3.5 g/dL (ref 3.5–5.0)
Anion gap: 3 — ABNORMAL LOW (ref 5–15)
BILIRUBIN TOTAL: 0.9 mg/dL (ref 0.3–1.2)
BUN: 12 mg/dL (ref 8–23)
CALCIUM: 8.3 mg/dL — AB (ref 8.9–10.3)
CO2: 27 mmol/L (ref 22–32)
Chloride: 106 mmol/L (ref 98–111)
Creatinine, Ser: 0.96 mg/dL (ref 0.61–1.24)
GFR calc Af Amer: >60 mL/min (ref >60)
GFR calc non Af Amer: >60 mL/min (ref >60)
Glucose, Bld: 120 mg/dL — ABNORMAL HIGH (ref 70–99)
Potassium: 4.2 mmol/L (ref 3.5–5.1)
Sodium: 136 mmol/L (ref 135–145)
TOTAL PROTEIN: 5.9 g/dL — AB (ref 6.5–8.1)

## 2018-05-25 LAB — SURGICAL PCR SCREEN
MRSA, PCR: NEGATIVE
STAPHYLOCOCCUS AUREUS: NEGATIVE

## 2018-05-25 LAB — APTT: aPTT: 39 seconds — ABNORMAL HIGH (ref 24–36)

## 2018-05-25 LAB — LACTIC ACID, PLASMA: Lactic Acid, Venous: 1.2 mmol/L (ref 0.5–1.9)

## 2018-05-25 NOTE — Progress Notes (Signed)
Patient ID: Benjamin [name], male   DOB: [birth year], 73 y.o.   MRN: [id number] [facility] Surgery Progress Note:   * No surgery found *  Subjective: Mental status is clear.  Discussed positions that he can assume to try to get this RIH to reduce Objective: Vital signs in last 24 hours: Temp:  [97.4 F (36.3 C)-98.3 F (36.8 C)] 98.3 F (36.8 C) (03/07 0556) Pulse Rate:  [52-66] 54 (03/07 0556) Resp:  [18-20] 18 (03/07 0556) BP: (143-175)/(73-107) 158/91 (03/07 0556) SpO2:  [95 %-98 %] 96 % (03/07 0556) Weight:  [91.1 kg] 91.1 kg (03/06 2354)  Intake/Output from previous day: 03/06 0701 - 03/07 0700 In: 645.5 [P.O.:60; I.V.:585.5] Out: 750 [Urine:750] Intake/Output this shift: No intake/output data recorded.  Physical Exam: Work of breathing is not labored.   Right inguinal hernia is present;  He has stinging in the region.  No flatus but gurgling.    Lab Results:  Results for orders placed or performed during the hospital encounter of 05/24/18 (from the past 48 hour(s))  CBC with Differential     Status: Abnormal   Collection Time: 05/24/18  5:46 PM  Result Value Ref Range   WBC 10.4 4.0 - 10.5 K/uL   RBC 5.45 4.22 - 5.81 MIL/uL   Hemoglobin 16.2 13.0 - 17.0 g/dL   HCT 49.5 39.0 - 52.0 %   MCV 90.8 80.0 - 100.0 fL   MCH 29.7 26.0 - 34.0 pg   MCHC 32.7 30.0 - 36.0 g/dL   RDW 13.1 11.5 - 15.5 %   Platelets 161 150 - 400 K/uL   nRBC 0.0 0.0 - 0.2 %   Neutrophils Relative % 90 %   Neutro Abs 9.3 (H) 1.7 - 7.7 K/uL   Lymphocytes Relative 6 %   Lymphs Abs 0.6 (L) 0.7 - 4.0 K/uL   Monocytes Relative 4 %   Monocytes Absolute 0.4 0.1 - 1.0 K/uL   Eosinophils Relative 0 %   Eosinophils Absolute 0.0 0.0 - 0.5 K/uL   Basophils Relative 0 %   Basophils Absolute 0.0 0.0 - 0.1 K/uL   Immature Granulocytes 0 %   Abs Immature Granulocytes 0.02 0.00 - 0.07 K/uL    Comment: Performed at [facility], [address], [place], [place] [address]  Comprehensive metabolic panel      Status: Abnormal   Collection Time: 05/24/18  5:46 PM  Result Value Ref Range   Sodium 138 135 - 145 mmol/L   Potassium 4.1 3.5 - 5.1 mmol/L   Chloride 103 98 - 111 mmol/L   CO2 24 22 - 32 mmol/L   Glucose, Bld 121 (H) 70 - 99 mg/dL   BUN 13 8 - 23 mg/dL   Creatinine, Ser 0.94 0.61 - 1.24 mg/dL   Calcium 9.1 8.9 - 10.3 mg/dL   Total Protein 7.5 6.5 - 8.1 g/dL   Albumin 4.4 3.5 - 5.0 g/dL   AST 26 15 - 41 U/L   ALT 12 0 - 44 U/L   Alkaline Phosphatase 82 38 - 126 U/L   Total Bilirubin 1.5 (H) 0.3 - 1.2 mg/dL   GFR calc non Af Amer >60 >60 mL/min   GFR calc Af Amer >60 >60 mL/min   Anion gap 11 5 - 15    Comment: Performed at [facility] [address], [place], [place] [address]  Lipase, blood     Status: None   Collection Time: 05/24/18  5:46 PM  Result Value Ref Range   Lipase 27 11 - 51 U/L    Comment: Performed at [facility] [address] [place], [place] [address]  I-stat troponin, ED     Status: None   Collection Time: 05/24/18  6:06 PM  Result Value Ref Range   Troponin i, poc 0.01 0.00 - 0.08 ng/mL   Comment 3            Comment: Due to the release kinetics of cTnI, a negative result within the first hours of the onset of symptoms does not rule out myocardial infarction with certainty. If myocardial infarction is still suspected, repeat the test at appropriate intervals.   Surgical pcr screen     Status: None   Collection Time: 05/24/18 11:22 PM  Result Value Ref Range   MRSA, PCR NEGATIVE NEGATIVE   Staphylococcus aureus NEGATIVE NEGATIVE    Comment: (NOTE) The Xpert SA Assay (FDA approved for NASAL specimens in patients 53 years of age and older), is one component of a comprehensive surveillance program. It is not intended to diagnose infection nor to guide or monitor treatment. Performed at [facility], [place] [address] [place], [place] [address]   Lactic acid, plasma     Status: None   Collection Time: 05/25/18 12:24 AM   Result Value Ref Range   Lactic Acid, Venous 1.2 0.5 - 1.9 mmol/L    Comment: Performed at [facility] [address] [place], [place] [address]  Comprehensive metabolic panel     Status: Abnormal   Collection Time: 05/25/18 12:24 AM  Result Value Ref Range   Sodium 136 135 - 145 mmol/L   Potassium 4.2 3.5 - 5.1 mmol/L   Chloride 106 98 - 111 mmol/L   CO2 27 22 - 32 mmol/L   Glucose, Bld 120 (H) 70 - 99 mg/dL   BUN 12 8 - 23 mg/dL   Creatinine, Ser 0.96 0.61 - 1.24 mg/dL   Calcium 8.3 (L) 8.9 - 10.3 mg/dL   Total Protein 5.9 (L) 6.5 - 8.1 g/dL   Albumin 3.5 3.5 - 5.0 g/dL   AST 16 15 - 41 U/L   ALT 14 0 - 44 U/L   Alkaline Phosphatase 61 38 - 126 U/L   Total Bilirubin 0.9 0.3 - 1.2 mg/dL   GFR calc non Af Amer >60 >60 mL/min   GFR calc Af Amer >60 >60 mL/min   Anion gap 3 (L) 5 - 15    Comment: Performed at [facility], [place] [address] [place], [place] [address]  CBC     Status: Abnormal   Collection Time: 05/25/18 12:24 AM  Result Value Ref Range   WBC 8.8 4.0 - 10.5 K/uL   RBC 4.78 4.22 - 5.81 MIL/uL   Hemoglobin 14.1 13.0 - 17.0 g/dL   HCT 43.0 39.0 - 52.0 %   MCV 90.0 80.0 - 100.0 fL   MCH 29.5 26.0 - 34.0 pg   MCHC 32.8 30.0 - 36.0 g/dL   RDW 13.0 11.5 - 15.5 %   Platelets 136 (L) 150 - 400 K/uL    Comment: REPEATED TO VERIFY   nRBC 0.0 0.0 - 0.2 %    Comment: Performed at [facility], [place] [address] [place], [place] [address]  APTT     Status: Abnormal   Collection Time: 05/25/18 12:24 AM  Result Value Ref Range   aPTT 39 (H) 24 - 36 seconds  Comment:        IF BASELINE aPTT IS ELEVATED, SUGGEST PATIENT RISK ASSESSMENT BE USED TO DETERMINE APPROPRIATE ANTICOAGULANT THERAPY. Performed at [facility], [place] [address] [place], [place] [address]   Protime-INR     Status: Abnormal   Collection Time: 05/25/18 12:24 AM  Result Value Ref Range   Prothrombin Time 15.8 (H) 11.4 - 15.2 seconds   INR 1.3 (H) 0.8 - 1.2    Comment:  (NOTE) INR goal varies based on device and disease states. Performed at [facility], [place] [address], [place], [place] [address]   Urinalysis, Routine w reflex microscopic     Status: Abnormal   Collection Time: 05/25/18  3:12 AM  Result Value Ref Range   Color, Urine YELLOW YELLOW   APPearance CLEAR CLEAR   Specific Gravity, Urine >1.046 (H) 1.005 - 1.030   pH 5.0 5.0 - 8.0   Glucose, UA NEGATIVE NEGATIVE mg/dL   Hgb urine dipstick NEGATIVE NEGATIVE   Bilirubin Urine NEGATIVE NEGATIVE   Ketones, ur 20 (A) NEGATIVE mg/dL   Protein, ur NEGATIVE NEGATIVE mg/dL   Nitrite NEGATIVE NEGATIVE   Leukocytes,Ua NEGATIVE NEGATIVE    Comment: Performed at [facility] [address] [place], [place] [address]    Radiology/Results: Ct Abdomen Pelvis W Contrast  Result Date: 05/24/2018 CLINICAL DATA:  Hernia EXAM: CT ABDOMEN AND PELVIS WITH CONTRAST TECHNIQUE: Multidetector CT imaging of the abdomen and pelvis was performed using the standard protocol following bolus administration of intravenous contrast. CONTRAST:  124mL OMNIPAQUE IOHEXOL 300 MG/ML  SOLN COMPARISON:  PET CT 05/17/2017, CT 10/16/2012 FINDINGS: Lower chest: Lung bases demonstrate irregular nodule with surrounding ground-glass density at the right lower lobe, this appears increased as compared with prior PET-CT. This is incompletely visualized. No pleural effusion. Subpleural calcification in the posterior right base. Borderline cardiomegaly. Intracardiac pacing leads. Mild distal esophageal thickening. Hepatobiliary: No focal liver abnormality is seen. No gallstones, gallbladder wall thickening, or biliary dilatation. Pancreas: Unremarkable. No pancreatic ductal dilatation or surrounding inflammatory changes. Spleen: Normal in size without focal abnormality. Adrenals/Urinary Tract: Left adrenal gland is normal. Surgical clips in the region of right adrenal gland. No hydronephrosis. The bladder is normal. Stomach/Bowel: The  stomach is nonenlarged. No dilated small bowel. A portion of the sigmoid colon is now contained within a right inguinal hernia. There is mild wall thickening of the bowel within the hernia sac. Vascular/Lymphatic: Nonaneurysmal aorta. Moderate aortic atherosclerosis. No significantly enlarged lymph nodes Reproductive: Enlarged prostate gland Other: No free air. No significant free fluid. Interval right inguinal hernia containing moderate to large amount of fat and a portion of the sigmoid colon. There is mesenteric vascular congestion within the hernia sac. The bowel that is within the sac demonstrates edema and wall thickening. There is moderate fluid in the hernia sac. Musculoskeletal: Advanced degenerative change at L4-L5 with 11 mm anterolisthesis of L4 on L5 and chronic appearing bilateral pars defect. IMPRESSION: 1. Interval finding of moderate right inguinal hernia, now containing mesenteric fat and a portion of the sigmoid colon. The sigmoid colon contained within the hernia demonstrates wall thickening. There is edema and moderate fluid within the hernia sac in addition to mesenteric vascular congestion, all consistent with incarcerated hernia. There is no obstruction related to the hernia. 2. Irregular mass with surrounding ground-glass density in the right lower lobe, incompletely visualized. 3. Prior right adrenalectomy 4. Prostatomegaly Electronically Signed   By: Donavan Foil M.D.   On:  05/24/2018 20:28    Anti-infectives: Anti-infectives (From admission, onward)   None      Assessment/Plan: Problem List: Patient Active Problem List   Diagnosis Date Noted  . Incarcerated inguinal hernia 05/24/2018  . Complete heart block ([place]) 08/29/2016  . 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 Pneumothorax after biopsy 08/24/2015  . Hyperlipidemia 09/14/2014  . Mobitz type 1 second degree atrioventricular block 10/17/2012  . Hx of NSTEMI tx with DES to RCA  and DES to LCx in 2014 10/16/2012  . Chest pain, atypical 07/02/2012  . Malignant neoplasm of hilus of lung ([place])   . Adenopathy   . Anxiety   . Arthritis   . Hx of melanoma of skin   . Permanent atrial fibrillation   . CAD (coronary artery disease)     Day one off Eloquis.  Will probably need repair Sunday or Monday at the latest.  Will recheck lab.   * No surgery found *    LOS: 0 days   Matt B. Hassell Done, MD, [facility] [phone] beeper [phone]  05/25/2018 8:37 AM

## 2018-05-25 NOTE — Care Management Obs Status (Signed)
[facility] NOTIFICATION   Patient Details  Name: Benjamin [name] MRN: [id number] Date of Birth: January 07, 1946   Medicare Observation Status Notification Given:  Yes    Claudie Leach, RN 05/25/2018, 4:45 PM

## 2018-05-26 DIAGNOSIS — Z85820 Personal history of malignant melanoma of skin: Secondary | ICD-10-CM | POA: Diagnosis not present

## 2018-05-26 DIAGNOSIS — F419 Anxiety disorder, unspecified: Secondary | ICD-10-CM | POA: Diagnosis present

## 2018-05-26 DIAGNOSIS — Z8249 Family history of ischemic heart disease and other diseases of the circulatory system: Secondary | ICD-10-CM | POA: Diagnosis not present

## 2018-05-26 DIAGNOSIS — Z801 Family history of malignant neoplasm of trachea, bronchus and lung: Secondary | ICD-10-CM | POA: Diagnosis not present

## 2018-05-26 DIAGNOSIS — Z79899 Other long term (current) drug therapy: Secondary | ICD-10-CM | POA: Diagnosis not present

## 2018-05-26 DIAGNOSIS — I10 Essential (primary) hypertension: Secondary | ICD-10-CM | POA: Diagnosis present

## 2018-05-26 DIAGNOSIS — I4821 Permanent atrial fibrillation: Secondary | ICD-10-CM | POA: Diagnosis present

## 2018-05-26 DIAGNOSIS — Z803 Family history of malignant neoplasm of breast: Secondary | ICD-10-CM | POA: Diagnosis not present

## 2018-05-26 DIAGNOSIS — Z885 Allergy status to narcotic agent status: Secondary | ICD-10-CM | POA: Diagnosis not present

## 2018-05-26 DIAGNOSIS — Z950 Presence of cardiac pacemaker: Secondary | ICD-10-CM | POA: Diagnosis not present

## 2018-05-26 DIAGNOSIS — Z7901 Long term (current) use of anticoagulants: Secondary | ICD-10-CM | POA: Diagnosis not present

## 2018-05-26 DIAGNOSIS — Z955 Presence of coronary angioplasty implant and graft: Secondary | ICD-10-CM | POA: Diagnosis not present

## 2018-05-26 DIAGNOSIS — N401 Enlarged prostate with lower urinary tract symptoms: Secondary | ICD-10-CM | POA: Diagnosis present

## 2018-05-26 DIAGNOSIS — R3911 Hesitancy of micturition: Secondary | ICD-10-CM | POA: Diagnosis present

## 2018-05-26 DIAGNOSIS — M1990 Unspecified osteoarthritis, unspecified site: Secondary | ICD-10-CM | POA: Diagnosis present

## 2018-05-26 DIAGNOSIS — K4030 Unilateral inguinal hernia, with obstruction, without gangrene, not specified as recurrent: Secondary | ICD-10-CM | POA: Diagnosis present

## 2018-05-26 DIAGNOSIS — I442 Atrioventricular block, complete: Secondary | ICD-10-CM | POA: Diagnosis present

## 2018-05-26 DIAGNOSIS — Z87891 Personal history of nicotine dependence: Secondary | ICD-10-CM | POA: Diagnosis not present

## 2018-05-26 DIAGNOSIS — K5909 Other constipation: Secondary | ICD-10-CM | POA: Diagnosis present

## 2018-05-26 DIAGNOSIS — E785 Hyperlipidemia, unspecified: Secondary | ICD-10-CM | POA: Diagnosis present

## 2018-05-26 DIAGNOSIS — Z85118 Personal history of other malignant neoplasm of bronchus and lung: Secondary | ICD-10-CM | POA: Diagnosis not present

## 2018-05-26 DIAGNOSIS — H3530 Unspecified macular degeneration: Secondary | ICD-10-CM | POA: Diagnosis present

## 2018-05-26 DIAGNOSIS — I252 Old myocardial infarction: Secondary | ICD-10-CM | POA: Diagnosis not present

## 2018-05-26 DIAGNOSIS — K460 Unspecified abdominal hernia with obstruction, without gangrene: Secondary | ICD-10-CM | POA: Diagnosis present

## 2018-05-26 DIAGNOSIS — I2510 Atherosclerotic heart disease of native coronary artery without angina pectoris: Secondary | ICD-10-CM | POA: Diagnosis present

## 2018-05-26 LAB — BASIC METABOLIC PANEL
Anion gap: 7 (ref 5–15)
BUN: 13 mg/dL (ref 8–23)
CO2: 25 mmol/L (ref 22–32)
Calcium: 7.9 mg/dL — ABNORMAL LOW (ref 8.9–10.3)
Chloride: 107 mmol/L (ref 98–111)
Creatinine, Ser: 1.09 mg/dL (ref 0.61–1.24)
GFR calc Af Amer: >60 mL/min (ref >60)
GFR calc non Af Amer: >60 mL/min (ref >60)
GLUCOSE: 96 mg/dL (ref 70–99)
Potassium: 3.8 mmol/L (ref 3.5–5.1)
Sodium: 139 mmol/L (ref 135–145)

## 2018-05-26 LAB — CBC WITH DIFFERENTIAL/PLATELET
Abs Immature Granulocytes: 0.02 10*3/uL (ref 0.00–0.07)
Basophils Absolute: 0 10*3/uL (ref 0.0–0.1)
Basophils Relative: 0 %
EOS ABS: 0.3 10*3/uL (ref 0.0–0.5)
EOS PCT: 5 %
HCT: 41.8 % (ref 39.0–52.0)
Hemoglobin: 13.5 g/dL (ref 13.0–17.0)
Immature Granulocytes: 0 %
LYMPHS PCT: 14 %
Lymphs Abs: 0.9 10*3/uL (ref 0.7–4.0)
MCH: 29.6 pg (ref 26.0–34.0)
MCHC: 32.3 g/dL (ref 30.0–36.0)
MCV: 91.7 fL (ref 80.0–100.0)
Monocytes Absolute: 0.8 10*3/uL (ref 0.1–1.0)
Monocytes Relative: 12 %
Neutro Abs: 4.3 10*3/uL (ref 1.7–7.7)
Neutrophils Relative %: 69 %
Platelets: 123 10*3/uL — ABNORMAL LOW (ref 150–400)
RBC: 4.56 MIL/uL (ref 4.22–5.81)
RDW: 13.2 % (ref 11.5–15.5)
WBC: 6.3 10*3/uL (ref 4.0–10.5)
nRBC: 0 % (ref 0.0–0.2)

## 2018-05-26 LAB — LACTIC ACID, PLASMA: Lactic Acid, Venous: 0.8 mmol/L (ref 0.5–1.9)

## 2018-05-26 NOTE — Progress Notes (Signed)
Patient ID: Benjamin [name], male   DOB: [birth year], 73 y.o.   MRN: [id number] [facility] Surgery Progress Note:   * No surgery found *  Subjective: Mental status is clear.  Discussed his hx of lung cancer since 2003.   Objective: Vital signs in last 24 hours: Temp:  [97.8 F (36.6 C)-98 F (36.7 C)] 98 F (36.7 C) (03/08 0502) Pulse Rate:  [59-60] 60 (03/08 0502) Resp:  [17-18] 17 (03/08 0502) BP: (128-142)/(64-86) 142/86 (03/08 0502) SpO2:  [96 %-99 %] 96 % (03/08 0502)  Intake/Output from previous day: 03/07 0701 - 03/08 0700 In: 2463.8 [P.O.:240; I.V.:2223.8] Out: 400 [Urine:400] Intake/Output this shift: No intake/output data recorded.  Physical Exam: Work of breathing is not labored.  Hernia reduced yesterday.    Lab Results:  Results for orders placed or performed during the hospital encounter of 05/24/18 (from the past 48 hour(s))  CBC with Differential     Status: Abnormal   Collection Time: 05/24/18  5:46 PM  Result Value Ref Range   WBC 10.4 4.0 - 10.5 K/uL   RBC 5.45 4.22 - 5.81 MIL/uL   Hemoglobin 16.2 13.0 - 17.0 g/dL   HCT 49.5 39.0 - 52.0 %   MCV 90.8 80.0 - 100.0 fL   MCH 29.7 26.0 - 34.0 pg   MCHC 32.7 30.0 - 36.0 g/dL   RDW 13.1 11.5 - 15.5 %   Platelets 161 150 - 400 K/uL   nRBC 0.0 0.0 - 0.2 %   Neutrophils Relative % 90 %   Neutro Abs 9.3 (H) 1.7 - 7.7 K/uL   Lymphocytes Relative 6 %   Lymphs Abs 0.6 (L) 0.7 - 4.0 K/uL   Monocytes Relative 4 %   Monocytes Absolute 0.4 0.1 - 1.0 K/uL   Eosinophils Relative 0 %   Eosinophils Absolute 0.0 0.0 - 0.5 K/uL   Basophils Relative 0 %   Basophils Absolute 0.0 0.0 - 0.1 K/uL   Immature Granulocytes 0 %   Abs Immature Granulocytes 0.02 0.00 - 0.07 K/uL    Comment: Performed at [facility], [address] [place], [place] [address]  Comprehensive metabolic panel     Status: Abnormal   Collection Time: 05/24/18  5:46 PM  Result Value Ref Range   Sodium 138 135 - 145 mmol/L   Potassium 4.1 3.5  - 5.1 mmol/L   Chloride 103 98 - 111 mmol/L   CO2 24 22 - 32 mmol/L   Glucose, Bld 121 (H) 70 - 99 mg/dL   BUN 13 8 - 23 mg/dL   Creatinine, Ser 0.94 0.61 - 1.24 mg/dL   Calcium 9.1 8.9 - 10.3 mg/dL   Total Protein 7.5 6.5 - 8.1 g/dL   Albumin 4.4 3.5 - 5.0 g/dL   AST 26 15 - 41 U/L   ALT 12 0 - 44 U/L   Alkaline Phosphatase 82 38 - 126 U/L   Total Bilirubin 1.5 (H) 0.3 - 1.2 mg/dL   GFR calc non Af Amer >60 >60 mL/min   GFR calc Af Amer >60 >60 mL/min   Anion gap 11 5 - 15    Comment: Performed at [facility] [address] [place], [place] [address]  Lipase, blood     Status: None   Collection Time: 05/24/18  5:46 PM  Result Value Ref Range   Lipase 27 11 - 51 U/L    Comment: Performed at [facility], [place],  [place] [address]  I-stat troponin, ED     Status: None   Collection Time: 05/24/18  6:06 PM  Result Value Ref Range   Troponin i, poc 0.01 0.00 - 0.08 ng/mL   Comment 3            Comment: Due to the release kinetics of cTnI, a negative result within the first hours of the onset of symptoms does not rule out myocardial infarction with certainty. If myocardial infarction is still suspected, repeat the test at appropriate intervals.   Surgical pcr screen     Status: None   Collection Time: 05/24/18 11:22 PM  Result Value Ref Range   MRSA, PCR NEGATIVE NEGATIVE   Staphylococcus aureus NEGATIVE NEGATIVE    Comment: (NOTE) The Xpert SA Assay (FDA approved for NASAL specimens in patients 59 years of age and older), is one component of a comprehensive surveillance program. It is not intended to diagnose infection nor to guide or monitor treatment. Performed at [facility], [place] [address], [place], [place] [address]   Lactic acid, plasma     Status: None   Collection Time: 05/25/18 12:24 AM  Result Value Ref Range   Lactic Acid, Venous 1.2 0.5 - 1.9 mmol/L    Comment: Performed at [facility] [address],  [place], [place] [address]  Comprehensive metabolic panel     Status: Abnormal   Collection Time: 05/25/18 12:24 AM  Result Value Ref Range   Sodium 136 135 - 145 mmol/L   Potassium 4.2 3.5 - 5.1 mmol/L   Chloride 106 98 - 111 mmol/L   CO2 27 22 - 32 mmol/L   Glucose, Bld 120 (H) 70 - 99 mg/dL   BUN 12 8 - 23 mg/dL   Creatinine, Ser 0.96 0.61 - 1.24 mg/dL   Calcium 8.3 (L) 8.9 - 10.3 mg/dL   Total Protein 5.9 (L) 6.5 - 8.1 g/dL   Albumin 3.5 3.5 - 5.0 g/dL   AST 16 15 - 41 U/L   ALT 14 0 - 44 U/L   Alkaline Phosphatase 61 38 - 126 U/L   Total Bilirubin 0.9 0.3 - 1.2 mg/dL   GFR calc non Af Amer >60 >60 mL/min   GFR calc Af Amer >60 >60 mL/min   Anion gap 3 (L) 5 - 15    Comment: Performed at [facility], [place] [address] [place], [place] [address]  CBC     Status: Abnormal   Collection Time: 05/25/18 12:24 AM  Result Value Ref Range   WBC 8.8 4.0 - 10.5 K/uL   RBC 4.78 4.22 - 5.81 MIL/uL   Hemoglobin 14.1 13.0 - 17.0 g/dL   HCT 43.0 39.0 - 52.0 %   MCV 90.0 80.0 - 100.0 fL   MCH 29.5 26.0 - 34.0 pg   MCHC 32.8 30.0 - 36.0 g/dL   RDW 13.0 11.5 - 15.5 %   Platelets 136 (L) 150 - 400 K/uL    Comment: REPEATED TO VERIFY   nRBC 0.0 0.0 - 0.2 %    Comment: Performed at [facility], [place] [address] [place], [place] [address]  APTT     Status: Abnormal   Collection Time: 05/25/18 12:24 AM  Result Value Ref Range   aPTT 39 (H) 24 - 36 seconds    Comment:        IF BASELINE aPTT IS ELEVATED, SUGGEST PATIENT RISK ASSESSMENT BE USED TO DETERMINE APPROPRIATE ANTICOAGULANT THERAPY.  Performed at [facility], [place] [address], [place], [place] [address]   Protime-INR     Status: Abnormal   Collection Time: 05/25/18 12:24 AM  Result Value Ref Range   Prothrombin Time 15.8 (H) 11.4 - 15.2 seconds   INR 1.3 (H) 0.8 - 1.2    Comment: (NOTE) INR goal varies based on device and disease states. Performed at [facility], [place] [address], [place], [place] [address]    Urinalysis, Routine w reflex microscopic     Status: Abnormal   Collection Time: 05/25/18  3:12 AM  Result Value Ref Range   Color, Urine YELLOW YELLOW   APPearance CLEAR CLEAR   Specific Gravity, Urine >1.046 (H) 1.005 - 1.030   pH 5.0 5.0 - 8.0   Glucose, UA NEGATIVE NEGATIVE mg/dL   Hgb urine dipstick NEGATIVE NEGATIVE   Bilirubin Urine NEGATIVE NEGATIVE   Ketones, ur 20 (A) NEGATIVE mg/dL   Protein, ur NEGATIVE NEGATIVE mg/dL   Nitrite NEGATIVE NEGATIVE   Leukocytes,Ua NEGATIVE NEGATIVE    Comment: Performed at [facility] [address] [place], [place] [address]  CBC with Differential/Platelet     Status: Abnormal   Collection Time: 05/26/18  4:54 AM  Result Value Ref Range   WBC 6.3 4.0 - 10.5 K/uL   RBC 4.56 4.22 - 5.81 MIL/uL   Hemoglobin 13.5 13.0 - 17.0 g/dL   HCT 41.8 39.0 - 52.0 %   MCV 91.7 80.0 - 100.0 fL   MCH 29.6 26.0 - 34.0 pg   MCHC 32.3 30.0 - 36.0 g/dL   RDW 13.2 11.5 - 15.5 %   Platelets 123 (L) 150 - 400 K/uL    Comment: REPEATED TO VERIFY   nRBC 0.0 0.0 - 0.2 %   Neutrophils Relative % 69 %   Neutro Abs 4.3 1.7 - 7.7 K/uL   Lymphocytes Relative 14 %   Lymphs Abs 0.9 0.7 - 4.0 K/uL   Monocytes Relative 12 %   Monocytes Absolute 0.8 0.1 - 1.0 K/uL   Eosinophils Relative 5 %   Eosinophils Absolute 0.3 0.0 - 0.5 K/uL   Basophils Relative 0 %   Basophils Absolute 0.0 0.0 - 0.1 K/uL   Immature Granulocytes 0 %   Abs Immature Granulocytes 0.02 0.00 - 0.07 K/uL    Comment: Performed at [facility] [address] [place],  [address]  Basic metabolic panel     Status: Abnormal   Collection Time: 05/26/18  4:54 AM  Result Value Ref Range   Sodium 139 135 - 145 mmol/L   Potassium 3.8 3.5 - 5.1 mmol/L   Chloride 107 98 - 111 mmol/L   CO2 25 22 - 32 mmol/L   Glucose, Bld 96 70 - 99 mg/dL   BUN 13 8 - 23 mg/dL   Creatinine, Ser 1.09 0.61 - 1.24 mg/dL   Calcium 7.9 (L) 8.9 - 10.3 mg/dL   GFR calc non Af Amer >60 >60 mL/min    GFR calc Af Amer >60 >60 mL/min   Anion gap 7 5 - 15    Comment: Performed at [facility], [place] [address], [place], [place] [address]  Lactic acid, plasma     Status: None   Collection Time: 05/26/18  4:54 AM  Result Value Ref Range   Lactic Acid, Venous 0.8 0.5 - 1.9 mmol/L    Comment: Performed at [facility] [address] [place], [place]  [address]    Radiology/Results: Ct Abdomen Pelvis W Contrast  Result Date: 05/24/2018 CLINICAL DATA:  Hernia EXAM: CT ABDOMEN AND PELVIS WITH CONTRAST TECHNIQUE: Multidetector CT imaging of the abdomen and pelvis was performed using the standard protocol following bolus administration of intravenous contrast. CONTRAST:  143mL OMNIPAQUE IOHEXOL 300 MG/ML  SOLN COMPARISON:  PET CT 05/17/2017, CT 10/16/2012 FINDINGS: Lower chest: Lung bases demonstrate irregular nodule with surrounding ground-glass density at the right lower lobe, this appears increased as compared with prior PET-CT. This is incompletely visualized. No pleural effusion. Subpleural calcification in the posterior right base. Borderline cardiomegaly. Intracardiac pacing leads. Mild distal esophageal thickening. Hepatobiliary: No focal liver abnormality is seen. No gallstones, gallbladder wall thickening, or biliary dilatation. Pancreas: Unremarkable. No pancreatic ductal dilatation or surrounding inflammatory changes. Spleen: Normal in size without focal abnormality. Adrenals/Urinary Tract: Left adrenal gland is normal. Surgical clips in the region of right adrenal gland. No hydronephrosis. The bladder is normal. Stomach/Bowel: The stomach is nonenlarged. No dilated small bowel. A portion of the sigmoid colon is now contained within a right inguinal hernia. There is mild wall thickening of the bowel within the hernia sac. Vascular/Lymphatic: Nonaneurysmal aorta. Moderate aortic atherosclerosis. No significantly enlarged lymph nodes Reproductive: Enlarged prostate gland Other: No free air.  No significant free fluid. Interval right inguinal hernia containing moderate to large amount of fat and a portion of the sigmoid colon. There is mesenteric vascular congestion within the hernia sac. The bowel that is within the sac demonstrates edema and wall thickening. There is moderate fluid in the hernia sac. Musculoskeletal: Advanced degenerative change at L4-L5 with 11 mm anterolisthesis of L4 on L5 and chronic appearing bilateral pars defect. IMPRESSION: 1. Interval finding of moderate right inguinal hernia, now containing mesenteric fat and a portion of the sigmoid colon. The sigmoid colon contained within the hernia demonstrates wall thickening. There is edema and moderate fluid within the hernia sac in addition to mesenteric vascular congestion, all consistent with incarcerated hernia. There is no obstruction related to the hernia. 2. Irregular mass with surrounding ground-glass density in the right lower lobe, incompletely visualized. 3. Prior right adrenalectomy 4. Prostatomegaly Electronically Signed   By: Donavan Foil M.D.   On: 05/24/2018 20:28    Anti-infectives: Anti-infectives (From admission, onward)   None      Assessment/Plan: Problem List: Patient Active Problem List   Diagnosis Date Noted  . Incarcerated inguinal hernia 05/24/2018  . Complete heart block ([place]) 08/29/2016  . 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 Pneumothorax after biopsy 08/24/2015  . Hyperlipidemia 09/14/2014  . Mobitz type 1 second degree atrioventricular block 10/17/2012  . Hx of NSTEMI tx with DES to RCA and DES to LCx in 2014 10/16/2012  . Chest pain, atypical 07/02/2012  . Malignant neoplasm of hilus of lung ([place])   . Adenopathy   . Anxiety   . Arthritis   . Hx of melanoma of skin   . Permanent atrial fibrillation   . CAD (coronary artery disease)     Labs show nl lactic acid and WBC nl.  Will make NPO for Dr. Brantley Stage to do tomorrow.   * No  surgery found *    LOS: 0 days   Matt B. Hassell Done, MD, [facility] [phone] beeper [phone]  05/26/2018 8:28 AM

## 2018-05-27 ENCOUNTER — Inpatient Hospital Stay (HOSPITAL_COMMUNITY): Payer: Medicare Other | Admitting: Certified Registered Nurse Anesthetist

## 2018-05-27 ENCOUNTER — Encounter (HOSPITAL_COMMUNITY): Admission: EM | Disposition: A | Discharge status: Home / Self Care | Payer: Self-pay | Source: Ambulatory Visit

## 2018-05-27 ENCOUNTER — Encounter (HOSPITAL_COMMUNITY): Payer: Self-pay | Admitting: *Deleted

## 2018-05-27 HISTORY — PX: HERNIA REPAIR: SHX51

## 2018-05-27 HISTORY — PX: INGUINAL HERNIA REPAIR: SHX194

## 2018-05-27 SURGERY — REPAIR, HERNIA, INGUINAL, ADULT
Anesthesia: Regional | Site: Groin | Laterality: Right

## 2018-05-27 MED ORDER — OXYCODONE HCL 5 MG/5ML PO SOLN: 5.0000 mg | Freq: Once | ORAL | Status: AC | PRN

## 2018-05-27 MED ORDER — LACTATED RINGERS IV SOLN
INTRAVENOUS | Status: DC
  Administered 2018-05-27: 09:00:00 via INTRAVENOUS

## 2018-05-27 MED ORDER — ONDANSETRON HCL 4 MG/2ML IJ SOLN
INTRAMUSCULAR | Status: DC | PRN
  Administered 2018-05-27: 4 mg via INTRAVENOUS

## 2018-05-27 MED ORDER — PROPOFOL 10 MG/ML IV BOLUS
INTRAVENOUS | Status: DC | PRN
  Administered 2018-05-27: 140 mg via INTRAVENOUS

## 2018-05-27 MED ORDER — FENTANYL CITRATE (PF) 250 MCG/5ML IJ SOLN
INTRAMUSCULAR | Status: DC | PRN
  Administered 2018-05-27 (×2): 50 ug via INTRAVENOUS

## 2018-05-27 MED ORDER — OXYCODONE HCL 5 MG PO TABS
5.0000 mg | ORAL_TABLET | Freq: Once | ORAL | Status: AC | PRN
  Administered 2018-05-27: 5 mg via ORAL

## 2018-05-27 MED ORDER — ONDANSETRON HCL 4 MG/2ML IJ SOLN
INTRAMUSCULAR | Status: AC
  Filled 2018-05-27: qty 2

## 2018-05-27 MED ORDER — DEXAMETHASONE SODIUM PHOSPHATE 10 MG/ML IJ SOLN
INTRAMUSCULAR | Status: AC
  Filled 2018-05-27: qty 1

## 2018-05-27 MED ORDER — LIDOCAINE 2% (20 MG/ML) 5 ML SYRINGE
INTRAMUSCULAR | Status: AC
  Filled 2018-05-27: qty 5

## 2018-05-27 MED ORDER — ACETAMINOPHEN 500 MG PO TABS: 1000.0000 mg | ORAL_TABLET | Freq: Once | ORAL | Status: DC | PRN

## 2018-05-27 MED ORDER — FENTANYL CITRATE (PF) 250 MCG/5ML IJ SOLN
INTRAMUSCULAR | Status: AC
  Filled 2018-05-27: qty 5

## 2018-05-27 MED ORDER — CEFAZOLIN SODIUM-DEXTROSE 2-3 GM-%(50ML) IV SOLR
INTRAVENOUS | Status: DC | PRN
  Administered 2018-05-27: 2 g via INTRAVENOUS

## 2018-05-27 MED ORDER — PHENYLEPHRINE 40 MCG/ML (10ML) SYRINGE FOR IV PUSH (FOR BLOOD PRESSURE SUPPORT)
PREFILLED_SYRINGE | INTRAVENOUS | Status: AC
  Filled 2018-05-27: qty 10

## 2018-05-27 MED ORDER — PROPOFOL 10 MG/ML IV BOLUS
INTRAVENOUS | Status: AC
  Filled 2018-05-27: qty 20

## 2018-05-27 MED ORDER — CEFAZOLIN SODIUM 1 G IJ SOLR
INTRAMUSCULAR | Status: AC
  Filled 2018-05-27: qty 20

## 2018-05-27 MED ORDER — LIDOCAINE 2% (20 MG/ML) 5 ML SYRINGE
INTRAMUSCULAR | Status: DC | PRN
  Administered 2018-05-27: 60 mg via INTRAVENOUS

## 2018-05-27 MED ORDER — ROCURONIUM BROMIDE 10 MG/ML (PF) SYRINGE
PREFILLED_SYRINGE | INTRAVENOUS | Status: DC | PRN
  Administered 2018-05-27: 50 mg via INTRAVENOUS

## 2018-05-27 MED ORDER — DEXAMETHASONE SODIUM PHOSPHATE 10 MG/ML IJ SOLN
INTRAMUSCULAR | Status: DC | PRN
  Administered 2018-05-27: 10 mg via INTRAVENOUS

## 2018-05-27 MED ORDER — ACETAMINOPHEN 10 MG/ML IV SOLN: 1000.0000 mg | Freq: Once | INTRAVENOUS | Status: DC | PRN

## 2018-05-27 MED ORDER — BUPIVACAINE-EPINEPHRINE (PF) 0.25% -1:200000 IJ SOLN
INTRAMUSCULAR | Status: AC
  Filled 2018-05-27: qty 30

## 2018-05-27 MED ORDER — PHENYLEPHRINE 40 MCG/ML (10ML) SYRINGE FOR IV PUSH (FOR BLOOD PRESSURE SUPPORT)
PREFILLED_SYRINGE | INTRAVENOUS | Status: DC | PRN
  Administered 2018-05-27: 80 ug via INTRAVENOUS
  Administered 2018-05-27: 40 ug via INTRAVENOUS
  Administered 2018-05-27: 80 ug via INTRAVENOUS

## 2018-05-27 MED ORDER — ACETAMINOPHEN 160 MG/5ML PO SOLN: 1000.0000 mg | Freq: Once | ORAL | Status: DC | PRN

## 2018-05-27 MED ORDER — FENTANYL CITRATE (PF) 100 MCG/2ML IJ SOLN: 25.0000 ug | INTRAMUSCULAR | Status: DC | PRN

## 2018-05-27 MED ORDER — BUPIVACAINE-EPINEPHRINE 0.25% -1:200000 IJ SOLN
INTRAMUSCULAR | Status: DC | PRN
  Administered 2018-05-27: 6 mL

## 2018-05-27 MED ORDER — OXYCODONE HCL 5 MG PO TABS
ORAL_TABLET | ORAL | Status: AC
  Filled 2018-05-27: qty 1

## 2018-05-27 MED ORDER — OXYCODONE-ACETAMINOPHEN 5-325 MG PO TABS: 1.0000 | ORAL_TABLET | ORAL | Status: DC | PRN

## 2018-05-27 MED ORDER — 0.9 % SODIUM CHLORIDE (POUR BTL) OPTIME
TOPICAL | Status: DC | PRN
  Administered 2018-05-27: 1000 mL

## 2018-05-27 MED ORDER — SUGAMMADEX SODIUM 200 MG/2ML IV SOLN
INTRAVENOUS | Status: DC | PRN
  Administered 2018-05-27: 200 mg via INTRAVENOUS

## 2018-05-27 MED ORDER — LACTATED RINGERS IV SOLN
INTRAVENOUS | Status: DC | PRN
  Administered 2018-05-27 (×2): via INTRAVENOUS

## 2018-05-27 MED ORDER — ROCURONIUM BROMIDE 50 MG/5ML IV SOSY
PREFILLED_SYRINGE | INTRAVENOUS | Status: AC
  Filled 2018-05-27: qty 5

## 2018-05-27 SURGICAL SUPPLY — 40 items
ADH SKN CLS APL DERMABOND .7 (GAUZE/BANDAGES/DRESSINGS) ×2
BLADE CLIPPER SURG (BLADE) ×2 IMPLANT
CANISTER SUCT 3000ML PPV (MISCELLANEOUS) IMPLANT
CHLORAPREP W/TINT 26ML (MISCELLANEOUS) ×3 IMPLANT
COVER SURGICAL LIGHT HANDLE (MISCELLANEOUS) ×3 IMPLANT
COVER WAND RF STERILE (DRAPES) ×1 IMPLANT
DERMABOND ADVANCED (GAUZE/BANDAGES/DRESSINGS) ×4
DERMABOND ADVANCED .7 DNX12 (GAUZE/BANDAGES/DRESSINGS) ×1 IMPLANT
DRAIN PENROSE 1/2X12 LTX STRL (WOUND CARE) ×2 IMPLANT
DRAPE LAPAROTOMY TRNSV 102X78 (DRAPE) ×3 IMPLANT
ELECT REM PT RETURN 9FT ADLT (ELECTROSURGICAL) ×3
ELECTRODE REM PT RTRN 9FT ADLT (ELECTROSURGICAL) ×1 IMPLANT
GLOVE BIO SURGEON STRL SZ8 (GLOVE) ×3 IMPLANT
GLOVE BIOGEL PI IND STRL 8 (GLOVE) ×1 IMPLANT
GLOVE BIOGEL PI INDICATOR 8 (GLOVE) ×2
GOWN STRL REUS W/ TWL LRG LVL3 (GOWN DISPOSABLE) ×1 IMPLANT
GOWN STRL REUS W/ TWL XL LVL3 (GOWN DISPOSABLE) ×1 IMPLANT
GOWN STRL REUS W/TWL LRG LVL3 (GOWN DISPOSABLE) ×6
GOWN STRL REUS W/TWL XL LVL3 (GOWN DISPOSABLE) ×3
KIT BASIN OR (CUSTOM PROCEDURE TRAY) ×3 IMPLANT
KIT TURNOVER KIT B (KITS) ×3 IMPLANT
MESH HERNIA SYS ULTRAPRO LRG (Mesh General) ×2 IMPLANT
NDL HYPO 25GX1X1/2 BEV (NEEDLE) ×1 IMPLANT
NEEDLE HYPO 25GX1X1/2 BEV (NEEDLE) ×3 IMPLANT
NS IRRIG 1000ML POUR BTL (IV SOLUTION) ×3 IMPLANT
PACK GENERAL/GYN (CUSTOM PROCEDURE TRAY) ×3 IMPLANT
PAD ARMBOARD 7.5X6 YLW CONV (MISCELLANEOUS) ×3 IMPLANT
PENCIL SMOKE EVACUATOR (MISCELLANEOUS) ×3 IMPLANT
SUT MNCRL AB 4-0 PS2 18 (SUTURE) ×3 IMPLANT
SUT NOVA NAB DX-16 0-1 5-0 T12 (SUTURE) ×7 IMPLANT
SUT SILK 2 0 SH (SUTURE) ×2 IMPLANT
SUT VIC AB 0 CT1 27 (SUTURE)
SUT VIC AB 0 CT1 27XBRD ANBCTR (SUTURE) IMPLANT
SUT VIC AB 2-0 SH 27 (SUTURE) ×3
SUT VIC AB 2-0 SH 27X BRD (SUTURE) ×1 IMPLANT
SUT VIC AB 3-0 SH 18 (SUTURE) ×3 IMPLANT
SUT VICRYL AB 3 0 TIES (SUTURE) ×3 IMPLANT
SYR CONTROL 10ML LL (SYRINGE) ×3 IMPLANT
TOWEL OR 17X24 6PK STRL BLUE (TOWEL DISPOSABLE) ×3 IMPLANT
TOWEL OR 17X26 10 PK STRL BLUE (TOWEL DISPOSABLE) ×3 IMPLANT

## 2018-05-27 NOTE — Transfer of Care (Signed)
Immediate Anesthesia Transfer of Care Note  Patient: Benjamin [name]  Procedure(s) Performed: INCARCERATED RIGHT INGUINAL HERNIA REPAIR (Right Groin)  Patient Location: PACU  Anesthesia Type:General and Regional  Level of Consciousness: awake, alert  and oriented  Airway & Oxygen Therapy: Patient Spontanous Breathing  Post-op Assessment: Report given to RN, Post -op Vital signs reviewed and stable and Patient moving all extremities X 4  Post vital signs: Reviewed and stable  Last Vitals:  Vitals Value Taken Time  BP    Temp    Pulse 68 05/27/2018 11:36 AM  Resp 14 05/27/2018 11:36 AM  SpO2 98 % 05/27/2018 11:36 AM  Vitals shown include unvalidated device data.  Last Pain:  Vitals:   05/27/18 0428  TempSrc: Oral  PainSc:          Complications: No apparent anesthesia complications

## 2018-05-27 NOTE — Op Note (Signed)
Preoperative diagnosis: History of incarcerated right inguinal hernia  Postoperative diagnosis reducible indirect right inguinal hernia   Procedure: Repair of right inguinal hernia with ultra Pro hernia mesh  Surgeon: Erroll Luna, MD  Anesthesia: General with 0.25% Sensorcaine local with epinephrine and TEP block per anesthesia  EBL: 10 cc  Specimen: None  Drains: None  IV fluids: Per anesthesia record  Indications for procedure: The patient's of 73 year old male mid to the weekend with an intermittently incarcerated right inguinal hernia.  This reduced but he requires repair due to intermittent episodes of incarceration and pain.  He was on Eliquis and this was held for 2 days.  Risk, benefits and other options of treatment as well as potential laparoscopic versus open repairs were discussed and the use of mesh and long-term expectation of mesh use as well as potential complications of mesh were discussed at length.The risk of hernia repair include bleeding,  Infection,   Recurrence of the hernia,  Mesh use, chronic pain,  Organ injury,  Bowel injury,  Bladder injury,   nerve injury with numbness around the incision,  Death,  and worsening of preexisting  medical problems.  The alternatives to surgery have been discussed as well..  Long term expectations of both operative and non operative treatments have been discussed.   The patient agrees to proceed.   Description of procedure: The patient was met in the holding area.  The right side was marked as the correct side.  He was taken back to the operating room placed supine upon the operating table.  After induction of general esthesia a right TEP block was placed per anesthesia.  This area was then prepped and draped in sterile fashion timeout was done.  Proper side, patient procedure were verified.  Local anesthetic consisting of 0.25% Sensorcaine was infiltrated along the right inguinal crease.  Incision was made dissection was carried down  to the aponeurosis of the external oblique.  The fibers were opened through the external ring with Metzenbaum scissors and the cord structures were visualized.  Quarter inch Penrose drain was placed around the cord structures to protect both the cord structures and the ilioinguinal nerve.  There is a large indirect defect and this sac was dissected off the cord and reduced back into the preperitoneal space.  A large Prolene hernia system was used with the inner leaflet placed in the preperitoneal space and deployed.  The the onlay was placed onto the floor the inguinal canal and a slit was cut for the cord structures.  This was secured to the shelving edge of the inguinal ligament, the pubic tubercle and the conjoined tendon with #1 Novafil.  The mesh was closed around the cord structures with #1 Novafil.  This left ample room for both the ilioinguinal nerve and cord structures to exit without tension or constriction.  Hemostasis was achieved.  2-0 Vicryl was used to close the aponeurosis of the external oblique.  3-0 Vicryl was used to approximate Scarpa's fascia and 4-0 Monocryl was used to close the skin in a subcuticular fashion.  Dermabond applied.  All final counts found to be correct.  The patient was awoke extubated taken to recovery in satisfactory condition.

## 2018-05-27 NOTE — H&P (View-Only) (Signed)
Day of Surgery   Subjective/Chief Complaint: NONE  Feels well Hernia staying in     Objective: Vital signs in last 24 hours: Temp:  [97.7 F (36.5 C)-98.3 F (36.8 C)] 98.3 F (36.8 C) (03/09 0428) Pulse Rate:  [60-77] 72 (03/09 0428) Resp:  [17-20] 18 (03/09 0428) BP: (137-161)/(79-91) 137/79 (03/09 0428) SpO2:  [92 %-100 %] 100 % (03/09 0428) Last BM Date: 05/25/18  Intake/Output from previous day: 03/08 0701 - 03/09 0700 In: 2707.4 [P.O.:680; I.V.:2027.4] Out: 3075 [Urine:3075] Intake/Output this shift: No intake/output data recorded.  General appearance: alert and cooperative Resp: clear to auscultation bilaterally GI: reducible RIH   Lab Results:  Recent Labs    05/25/18 0024 05/26/18 0454  WBC 8.8 6.3  HGB 14.1 13.5  HCT 43.0 41.8  PLT 136* 123*   BMET Recent Labs    05/25/18 0024 05/26/18 0454  NA 136 139  K 4.2 3.8  CL 106 107  CO2 27 25  GLUCOSE 120* 96  BUN 12 13  CREATININE 0.96 1.09  CALCIUM 8.3* 7.9*   PT/INR Recent Labs    05/25/18 0024  LABPROT 15.8*  INR 1.3*   ABG No results for input(s): PHART, HCO3 in the last 72 hours.  Invalid input(s): PCO2, PO2  Studies/Results: No results found.  Anti-infectives: Anti-infectives (From admission, onward)   None      Assessment/Plan: . Incarcerated inguinal hernia 05/24/2018  . 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 Pneumothorax after biopsy 08/24/2015  . Hyperlipidemia 09/14/2014  . Mobitz type 1 second degree atrioventricular block 10/17/2012  . Hx of NSTEMI tx with DES to RCA and DES to LCx in 2014 10/16/2012  . Chest pain, atypical 07/02/2012  . Malignant neoplasm of hilus of lung ([place])   . Adenopathy   . Anxiety   . Arthritis   . Hx of melanoma of skin   . Permanent atrial fibrillation   . CAD (coronary artery disease    Or FOR RIH MESH  The risk of hernia repair include  bleeding,  Infection,   Recurrence of the hernia,  Mesh use, PRO AND CONS OF MESH AND REASONS FOR USE  chronic pain,  Organ injury,  Bowel injury,  Bladder injury,   nerve injury with numbness around the incision,  Death,  and worsening of preexisting  medical problems.  The alternatives to surgery have been discussed as well..  Long term expectations of both operative and non operative treatments have been discussed.   The patient agrees to proceed.   LOS: 1 day    Benjamin [name] 05/27/2018

## 2018-05-27 NOTE — Anesthesia Procedure Notes (Signed)

## 2018-05-27 NOTE — Anesthesia Preprocedure Evaluation (Addendum)
Anesthesia Evaluation  Patient identified by MRN, date of birth, ID band Patient awake    Reviewed: Allergy & Precautions, NPO status , Patient's Chart, lab work & pertinent test results, reviewed documented beta blocker date and time   History of Anesthesia Complications Negative for: history of anesthetic complications  Airway Mallampati: II  TM Distance: >3 FB Neck ROM: Full    Dental  (+) Teeth Intact, Dental Advisory Given   Pulmonary former smoker,  H/o lung ca   breath sounds clear to auscultation- rhonchi       Cardiovascular hypertension, Pt. on medications and Pt. on home beta blockers (-) angina+ CAD, + Past MI and + Cardiac Stents  + dysrhythmias Atrial Fibrillation + pacemaker  Rhythm:Irregular  MI 2014 s/p 2 stents   Neuro/Psych PSYCHIATRIC DISORDERS negative neurological ROS     GI/Hepatic Neg liver ROS, right inginal hernia   Endo/Other  negative endocrine ROS  Renal/GU negative Renal ROS     Musculoskeletal  (+) Arthritis ,   Abdominal   Peds  Hematology negative hematology ROS (+)   Anesthesia Other Findings 2018 tte:  - Left ventricle: The cavity size was normal. Wall thickness was   increased in a pattern of moderate LVH. Systolic function was   normal. The estimated ejection fraction was in the range of 55%   to 60%. Wall motion was normal; there were no regional wall   motion abnormalities. - Aortic valve: There was trivial regurgitation. - Left atrium: The atrium was moderately dilated. - Right ventricle: The cavity size was mildly dilated. - Right atrium: The atrium was mildly dilated. - Pulmonary arteries: Systolic pressure was mildly increased.  Reproductive/Obstetrics                            Anesthesia Physical Anesthesia Plan  ASA: III  Anesthesia Plan: General and Regional   Post-op Pain Management:  Regional for Post-op pain   Induction:  Intravenous  PONV Risk Score and Plan: 2 and Ondansetron and Dexamethasone  Airway Management Planned: Oral ETT  Additional Equipment: None  Intra-op Plan:   Post-operative Plan: Extubation in OR  Informed Consent: I have reviewed the patients History and Physical, chart, labs and discussed the procedure including the risks, benefits and alternatives for the proposed anesthesia with the patient or authorized representative who has indicated his/her understanding and acceptance.     Dental advisory given  Plan Discussed with: CRNA and Surgeon  Anesthesia Plan Comments:         Anesthesia Quick Evaluation

## 2018-05-27 NOTE — Interval H&P Note (Signed)
History and Physical Interval Note:  05/27/2018 9:38 AM  Benjamin [name]  has presented today for surgery, with the diagnosis of right inginal hernia.  The various methods of treatment have been discussed with the patient and family. After consideration of risks, benefits and other options for treatment, the patient has consented to  Procedure(s): INCARCERATED HERNIA REPAIR INGUINAL ADULT OPEN (Right) as a surgical intervention.  The patient's history has been reviewed, patient examined, no change in status, stable for surgery.  I have reviewed the patient's chart and labs.  Questions were answered to the patient's satisfaction.     [place]

## 2018-05-27 NOTE — Progress Notes (Signed)
Day of Surgery   Subjective/Chief Complaint: NONE  Feels well Hernia staying in     Objective: Vital signs in last 24 hours: Temp:  [97.7 F (36.5 C)-98.3 F (36.8 C)] 98.3 F (36.8 C) (03/09 0428) Pulse Rate:  [60-77] 72 (03/09 0428) Resp:  [17-20] 18 (03/09 0428) BP: (137-161)/(79-91) 137/79 (03/09 0428) SpO2:  [92 %-100 %] 100 % (03/09 0428) Last BM Date: 05/25/18  Intake/Output from previous day: 03/08 0701 - 03/09 0700 In: 2707.4 [P.O.:680; I.V.:2027.4] Out: 3075 [Urine:3075] Intake/Output this shift: No intake/output data recorded.  General appearance: alert and cooperative Resp: clear to auscultation bilaterally GI: reducible RIH   Lab Results:  Recent Labs    05/25/18 0024 05/26/18 0454  WBC 8.8 6.3  HGB 14.1 13.5  HCT 43.0 41.8  PLT 136* 123*   BMET Recent Labs    05/25/18 0024 05/26/18 0454  NA 136 139  K 4.2 3.8  CL 106 107  CO2 27 25  GLUCOSE 120* 96  BUN 12 13  CREATININE 0.96 1.09  CALCIUM 8.3* 7.9*   PT/INR Recent Labs    05/25/18 0024  LABPROT 15.8*  INR 1.3*   ABG No results for input(s): PHART, HCO3 in the last 72 hours.  Invalid input(s): PCO2, PO2  Studies/Results: No results found.  Anti-infectives: Anti-infectives (From admission, onward)   None      Assessment/Plan: . Incarcerated inguinal hernia 05/24/2018  . 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 Pneumothorax after biopsy 08/24/2015  . Hyperlipidemia 09/14/2014  . Mobitz type 1 second degree atrioventricular block 10/17/2012  . Hx of NSTEMI tx with DES to RCA and DES to LCx in 2014 10/16/2012  . Chest pain, atypical 07/02/2012  . Malignant neoplasm of hilus of lung ([place])   . Adenopathy   . Anxiety   . Arthritis   . Hx of melanoma of skin   . Permanent atrial fibrillation   . CAD (coronary artery disease    Or FOR RIH MESH  The risk of hernia repair include  bleeding,  Infection,   Recurrence of the hernia,  Mesh use, PRO AND CONS OF MESH AND REASONS FOR USE  chronic pain,  Organ injury,  Bowel injury,  Bladder injury,   nerve injury with numbness around the incision,  Death,  and worsening of preexisting  medical problems.  The alternatives to surgery have been discussed as well..  Long term expectations of both operative and non operative treatments have been discussed.   The patient agrees to proceed.   LOS: 1 day    [place] Miron 05/27/2018

## 2018-05-28 ENCOUNTER — Encounter (HOSPITAL_COMMUNITY): Payer: Self-pay | Admitting: Surgery

## 2018-05-28 MED ORDER — TRAMADOL HCL 50 MG PO TABS: 50.0000 mg | ORAL_TABLET | Freq: Four times a day (QID) | ORAL | 0 refills | Status: DC | PRN

## 2018-05-28 MED ORDER — ACETAMINOPHEN 325 MG PO TABS: 650.0000 mg | ORAL_TABLET | Freq: Four times a day (QID) | ORAL | Status: AC | PRN

## 2018-05-28 NOTE — Progress Notes (Signed)
[name] to be D/C'd  per MD order. Discussed with the patient and all questions fully answered.  VSS, Skin clean, dry and intact without evidence of skin break down, no evidence of skin tears noted.  IV catheter discontinued intact. Site without signs and symptoms of complications. Dressing and pressure applied.  An After Visit Summary was printed and given to the patient. Patient received prescription.  D/c education completed with patient/family including follow up instructions, medication list, d/c activities limitations if indicated, with other d/c instructions as indicated by MD - patient able to verbalize understanding, all questions fully answered.   Patient instructed to return to ED, call 911, or call MD for any changes in condition.   Patient to be escorted via [place], and D/C home via private auto.

## 2018-05-28 NOTE — Discharge Summary (Signed)
[facility] Discharge Summary   Patient ID: Benjamin [name] MRN: [id number] DOB/AGE: 75-Jan-1947 75 y.o.  Admit date: 05/24/2018 Discharge date: 05/28/2018  Admitting Diagnosis: Incarcerated right inguinal hernia  Discharge Diagnosis Patient Active Problem List   Diagnosis Date Noted  . Incarcerated inguinal hernia 05/24/2018  . 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 Pneumothorax after biopsy 08/24/2015  . Hyperlipidemia 09/14/2014  . Mobitz type 1 second degree atrioventricular block 10/17/2012  . Hx of NSTEMI tx with DES to RCA and DES to LCx in 2014 10/16/2012  . Chest pain, atypical 07/02/2012  . Malignant neoplasm of hilus of lung ([place])   . Adenopathy   . Anxiety   . Arthritis   . Hx of melanoma of skin   . Permanent atrial fibrillation   . CAD (coronary artery disease)     Consultants None  Imaging: No results found.  Procedures Dr. Brantley Stage (05/27/18) - Open right inguinal hernia repair with mesh  Hospital Course:  Patient is a 73 year old male who presented to [facility] with right groin pain.  Workup showed incarcerated right inguinal hernia.  Patient was admitted but could not undergo immediate surgery secondary to anticoagulation. He  underwent procedure listed above once eliquis had worn off.  Tolerated procedure well and was transferred to the floor.  Diet was advanced as tolerated.  On POD#1, the patient was voiding well, tolerating diet, ambulating well, pain well controlled, vital signs stable, incisions c/d/i and felt stable for discharge home.  Patient will follow up in our office in 3 weeks and knows to call with questions or concerns.  He will call to confirm appointment date/time.    Physical Exam: General:  Alert, NAD, pleasant, comfortable Abd:  Soft, ND, non-tender GU: R inguinal incision c/d/i, no scrotal swelling or ecchymosis   Allergies as of 05/28/2018    Reactions   Codeine Other (See Comments)   Dizziness, stomach pain      Medication List    STOP taking these medications   SYSTANE OP     TAKE these medications   acetaminophen 325 MG tablet Commonly known as:  TYLENOL Take 2 tablets (650 mg total) by mouth every 6 (six) hours as needed for mild pain (or temp > 100).   atorvastatin 20 MG tablet Commonly known as:  LIPITOR Take 20 mg by mouth every evening.   clonazePAM 1 MG tablet Commonly known as:  KLONOPIN Take 1 mg by mouth 3 (three) times daily. 8am, 2pm, 8 pm   Eliquis 5 MG Tabs tablet Generic drug:  apixaban TAKE 1 TABLET(5 MG) BY MOUTH TWICE DAILY What changed:  See the new instructions.   metoprolol succinate 25 MG 24 hr tablet Commonly known as:  TOPROL-XL TAKE 1 TABLET(25 MG) BY MOUTH DAILY What changed:  See the new instructions.   nitroGLYCERIN 0.4 MG SL tablet Commonly known as:  NITROSTAT Place 1 tablet (0.4 mg total) under the tongue every 5 (five) minutes x 3 doses as needed for chest pain.   PreserVision AREDS 2 Caps Take 1 capsule by mouth 2 (two) times daily.   traMADol 50 MG tablet Commonly known as:  Ultram Take 1 tablet (50 mg total) by mouth every 6 (six) hours as needed for moderate pain or severe pain.        Follow-up Information    [facility], [facility]. Go on 06/18/2018.   Specialty:  General Surgery Why:  Follow up scheduled for 10:00 AM. Please arrive 30 min prior to appointment time. Bring photo ID and insurance information with you.  Contact information: [place] [address] [phone]           Signed: Brigid Re, [facility] 05/28/2018, 10:42 AM Pager: [phone] Consults: [phone]

## 2018-05-28 NOTE — Discharge Instructions (Signed)
RESUME ELIQUIS AS PRESCRIBED ON 05/29/18   [facility] _______[facility]  UMBILICAL OR INGUINAL HERNIA REPAIR: POST OP INSTRUCTIONS  Always review your discharge instruction sheet given to you by the facility where your surgery was performed. IF YOU HAVE DISABILITY OR FAMILY LEAVE FORMS, YOU MUST BRING THEM TO THE OFFICE FOR PROCESSING.   DO NOT GIVE THEM TO YOUR DOCTOR.  1. A  prescription for pain medication may be given to you upon discharge.  Take your pain medication as prescribed, if needed.  If narcotic pain medicine is not needed, then you may take acetaminophen (Tylenol) or ibuprofen (Advil) as needed. 2. Take your usually prescribed medications unless otherwise directed. If you need a refill on your pain medication, please contact your pharmacy.  They will contact our office to request authorization. Prescriptions will not be filled after 5 pm or on week-ends. 3. You should follow a light diet the first 24 hours after arrival home, such as soup and crackers, etc.  Be sure to include lots of fluids daily.  Resume your normal diet the day after surgery. 4.Most patients will experience some swelling and bruising around the umbilicus or in the groin and scrotum.  Ice packs and reclining will help.  Swelling and bruising can take several days to resolve.  6. It is common to experience some constipation if taking pain medication after surgery.  Increasing fluid intake and taking a stool softener (such as Colace) will usually help or prevent this problem from occurring.  A mild laxative (Milk of Magnesia or Miralax) should be taken according to package directions if there are no bowel movements after 48 hours. 7. Unless discharge instructions indicate otherwise, you may remove your bandages 24-48 hours after surgery, and you may shower at that time.  You may have steri-strips (small skin tapes) in place directly over the incision.  These strips should be left on the skin for 7-10 days.   If your surgeon used skin glue on the incision, you may shower in 24 hours.  The glue will flake off over the next 2-3 weeks.  Any sutures or staples will be removed at the office during your follow-up visit. 8. ACTIVITIES:  You may resume regular (light) daily activities beginning the next day--such as daily self-care, walking, climbing stairs--gradually increasing activities as tolerated.  You may have sexual intercourse when it is comfortable.  Refrain from any heavy lifting or straining until approved by your doctor.  a.You may drive when you are no longer taking prescription pain medication, you can comfortably wear a seatbelt, and you can safely maneuver your car and apply brakes.   9.You should see your doctor in the office for a follow-up appointment approximately 2-3 weeks after your surgery.  Make sure that you call for this appointment within a day or two after you arrive home to insure a convenient appointment time.  WHEN TO CALL YOUR DOCTOR: 1. Fever over 101.0 2. Inability to urinate 3. Nausea and/or vomiting 4. Extreme swelling or bruising 5. Continued bleeding from incision. 6. Increased pain, redness, or drainage from the incision  The clinic staff is available to answer your questions during regular business hours.  Please dont hesitate to call and ask to speak to one of the nurses for clinical concerns.  If you have a medical emergency, go to the nearest emergency room or call 911.  A surgeon from [facility] is always on call at the hospital   [address]  [place], [place], [place]  [address] ?  [address], [place], [place]   [address] [phone] ? [phone] ? FAX [phone] Web site: [email or web address]

## 2018-05-30 MED ORDER — ROPIVACAINE HCL 7.5 MG/ML IJ SOLN
INTRAMUSCULAR | Status: DC | PRN
  Administered 2018-05-27: 30 mL via PERINEURAL

## 2018-05-30 NOTE — Anesthesia Postprocedure Evaluation (Signed)
Anesthesia Post Note  Patient: TRESHUN WOLD  Procedure(s) Performed: INCARCERATED RIGHT INGUINAL HERNIA REPAIR (Right Groin)     Patient location during evaluation: PACU Anesthesia Type: Regional and General Level of consciousness: awake and alert Pain management: pain level controlled Vital Signs Assessment: post-procedure vital signs reviewed and stable Respiratory status: spontaneous breathing, nonlabored ventilation, respiratory function stable and patient connected to nasal cannula oxygen Cardiovascular status: blood pressure returned to baseline and stable Postop Assessment: no apparent nausea or vomiting Anesthetic complications: no    Last Vitals:  Vitals:   05/27/18 2201 05/28/18 0440  BP: 120/76 122/81  Pulse: 60 64  Resp: 18 18  Temp: 36.4 C 36.6 C  SpO2: 94% 97%    Last Pain:  Vitals:   05/28/18 0949  TempSrc:   PainSc: 1                  Jeris Roser

## 2018-05-30 NOTE — Anesthesia Procedure Notes (Signed)
Anesthesia Regional Block: TAP block   Pre-Anesthetic Checklist: ,, timeout performed, Correct Patient, Correct Site, Correct Laterality, Correct Procedure, Correct Position, site marked, Risks and benefits discussed,  Surgical consent,  Pre-op evaluation,  At surgeon's request and post-op pain management  Laterality: Right  Prep: chloraprep       Needles:  Injection technique: Single-shot     Needle Length: 9cm  Needle Gauge: 22     Additional Needles: Arrow StimuQuik ECHO Echogenic Stimulating PNB Needle  Procedures:,,,, ultrasound used (permanent image in chart),,,,  Narrative:  Start time: 05/27/2018 9:11 AM End time: 05/27/2018 9:14 AM Injection made incrementally with aspirations every 5 mL.  Performed by: Personally  Anesthesiologist: Oleta Mouse, MD

## 2018-06-27 ENCOUNTER — Other Ambulatory Visit: Payer: Self-pay

## 2018-06-27 ENCOUNTER — Encounter: Payer: Medicare Other | Admitting: *Deleted

## 2018-07-05 ENCOUNTER — Encounter: Payer: Self-pay | Admitting: Cardiology

## 2018-07-10 ENCOUNTER — Other Ambulatory Visit: Payer: Self-pay | Admitting: Geriatric Medicine

## 2018-07-10 ENCOUNTER — Ambulatory Visit
Admission: RE | Admit: 2018-07-10 | Discharge: 2018-07-10 | Disposition: A | Discharge status: Home / Self Care | Payer: Medicare Other | Source: Ambulatory Visit | Attending: Geriatric Medicine | Admitting: Geriatric Medicine

## 2018-07-10 ENCOUNTER — Other Ambulatory Visit: Payer: Self-pay

## 2018-07-10 DIAGNOSIS — C3431 Malignant neoplasm of lower lobe, right bronchus or lung: Secondary | ICD-10-CM

## 2018-07-10 DIAGNOSIS — R29898 Other symptoms and signs involving the musculoskeletal system: Secondary | ICD-10-CM

## 2018-07-10 MED ORDER — IOPAMIDOL (ISOVUE-300) INJECTION 61%
75.0000 mL | Freq: Once | INTRAVENOUS | Status: AC | PRN
  Administered 2018-07-10: 14:00:00 75 mL via INTRAVENOUS

## 2018-07-18 ENCOUNTER — Other Ambulatory Visit: Payer: Self-pay | Admitting: Neurological Surgery

## 2018-07-22 ENCOUNTER — Telehealth: Payer: Self-pay

## 2018-07-22 NOTE — Progress Notes (Signed)
[place], [address] [place] [address] Phone: [phone] Fax: [phone]  [place], [address] AT [facility] [address] [place] [address] Phone: [phone] Fax: [phone]      Your procedure is scheduled on May 7  Report to [facility] Main Entrance "A" Then check in at Admitting at 1040 A.M.  Call this number if you have problems the morning of surgery:  [phone]  Call [phone] if you have any questions prior to your surgery date Monday-Friday 8am-4pm    Remember:  Do not eat or drink after midnight.    Take these medicines the morning of surgery with A SIP OF WATER  clonazePAM (KLONOPIN metoprolol succinate (TOPROL-XL)  nitroGLYCERIN (NITROSTAT) only if needed for active chest pain  7 days prior to surgery STOP taking any Aspirin (unless otherwise instructed by your surgeon), Aleve, Naproxen, Ibuprofen, Motrin, Advil, Goody's, BC's, all herbal medications, fish oil, and all vitamins.    The Morning of Surgery  Do not wear jewelry  Do not wear lotions, powders, or perfumes/colognes, or deodorant  Do not shave 48 hours prior to surgery.  Men may shave face and neck.  Do not bring valuables to the hospital.  [facility] is not responsible for any belongings or valuables.  If you are a smoker, DO NOT Smoke 24 hours prior to surgery  REMEMBER THAT YOU MUST SOMEONE TO TRANSPORT YOU HOME AFTER SURGERY IF YOU ARE DISCHARGED THE SAME DAY OF SURGERY  YOU WILL ALSO NEED SOMEONE TO STAY WITH YOU FOR 24 HOURS AFTER SURGERY IF YOU ARE DISCHARGED THE SAME DAY OF SURGERY   Contacts, glasses, hearing aids, dentures or bridgework may not be worn into surgery.   If you wear a CPAP at night please bring your mask, tubing, and machine the morning of surgery   Leave your suitcase in the car.  After surgery it may be brought to your  room.  For patients admitted to the hospital, discharge time will be determined by your treatment team.  Patients discharged the day of surgery will not be allowed to drive home.    Special instructions:   [place]- Preparing For Surgery  Before surgery, you can play an important role. Because skin is not sterile, your skin needs to be as free of germs as possible. You can reduce the number of germs on your skin by washing with CHG (chlorahexidine gluconate) Soap before surgery.  CHG is an antiseptic cleaner which kills germs and bonds with the skin to continue killing germs even after washing.    Oral Hygiene is also important to reduce your risk of infection.  Remember - BRUSH YOUR TEETH THE MORNING OF SURGERY WITH YOUR REGULAR TOOTHPASTE  Please do not use if you have an allergy to CHG or antibacterial soaps. If your skin becomes reddened/irritated stop using the CHG.  Do not shave (including legs and underarms) for at least 48 hours prior to first CHG shower. It is OK to shave your face.  Please follow these instructions carefully.   1. Shower the NIGHT BEFORE SURGERY and the MORNING OF SURGERY with CHG Soap.   2. If you chose to wash your hair, wash your hair first as usual with your normal shampoo.  3. After you shampoo, rinse your hair and body thoroughly to remove the shampoo.  4. Use CHG as you would  any other liquid soap. You can apply CHG directly to the skin and wash gently with a scrungie or a clean washcloth.   5. Apply the CHG Soap to your body ONLY FROM THE NECK DOWN.  Do not use on open wounds or open sores. Avoid contact with your eyes, ears, mouth and genitals (private parts). Wash Face and genitals (private parts)  with your normal soap.   6. Wash thoroughly, paying special attention to the area where your surgery will be performed.  7. Thoroughly rinse your body with warm water from the neck down.  8. DO NOT shower/wash with your normal soap after using and  rinsing off the CHG Soap.  9. Pat yourself dry with a CLEAN TOWEL.  10. Wear CLEAN PAJAMAS to bed the night before surgery, wear comfortable clothes the morning of surgery  11. Place CLEAN SHEETS on your bed the night of your first shower and DO NOT SLEEP WITH PETS.    Day of Surgery:  Do not apply any deodorants/lotions.  Please wear clean clothes to the hospital/surgery center.   Remember to brush your teeth WITH YOUR REGULAR TOOTHPASTE.   Please read over the following fact sheets that you were given.

## 2018-07-22 NOTE — Telephone Encounter (Signed)
   [facility] Pre-operative Risk Assessment    Request for surgical clearance:  1. What type of surgery is being performed? L4-5 Laminectomy   2. When is this surgery scheduled? 07-25-18   3. What type of clearance is required (medical clearance vs. Pharmacy clearance to hold med vs. Both)? Both  4. Are there any medications that need to be held prior to surgery and how long? Eliquis? No specific time noted   5. Practice name and name of physician performing surgery? [facility].. Dr. Kristeen Miss  6. What is your office phone number [phone]    7.   What is your office fax number [phone]  8.   Anesthesia type (None, local, MAC, general) ?

## 2018-07-23 ENCOUNTER — Other Ambulatory Visit: Payer: Self-pay

## 2018-07-23 ENCOUNTER — Encounter (HOSPITAL_COMMUNITY): Payer: Self-pay

## 2018-07-23 ENCOUNTER — Encounter (HOSPITAL_COMMUNITY)
Admission: RE | Admit: 2018-07-23 | Discharge: 2018-07-23 | Disposition: A | Discharge status: Home / Self Care | Payer: Medicare Other | Source: Ambulatory Visit | Attending: Neurological Surgery | Admitting: Neurological Surgery

## 2018-07-23 DIAGNOSIS — Z01812 Encounter for preprocedural laboratory examination: Secondary | ICD-10-CM | POA: Insufficient documentation

## 2018-07-23 HISTORY — DX: Presence of cardiac pacemaker: Z95.0

## 2018-07-23 LAB — SURGICAL PCR SCREEN
MRSA, PCR: NEGATIVE
Staphylococcus aureus: NEGATIVE

## 2018-07-23 LAB — CBC
HCT: 45.6 % (ref 39.0–52.0)
Hemoglobin: 15.1 g/dL (ref 13.0–17.0)
MCH: 30.1 pg (ref 26.0–34.0)
MCHC: 33.1 g/dL (ref 30.0–36.0)
MCV: 91 fL (ref 80.0–100.0)
Platelets: 161 10*3/uL (ref 150–400)
RBC: 5.01 MIL/uL (ref 4.22–5.81)
RDW: 13.2 % (ref 11.5–15.5)
WBC: 6.7 10*3/uL (ref 4.0–10.5)
nRBC: 0 % (ref 0.0–0.2)

## 2018-07-23 LAB — BASIC METABOLIC PANEL
Anion gap: 8 (ref 5–15)
BUN: 17 mg/dL (ref 8–23)
CO2: 28 mmol/L (ref 22–32)
Calcium: 8.7 mg/dL — ABNORMAL LOW (ref 8.9–10.3)
Chloride: 103 mmol/L (ref 98–111)
Creatinine, Ser: 1.05 mg/dL (ref 0.61–1.24)
GFR calc Af Amer: >60 mL/min (ref >60)
GFR calc non Af Amer: >60 mL/min (ref >60)
Glucose, Bld: 166 mg/dL — ABNORMAL HIGH (ref 70–99)
Potassium: 3.8 mmol/L (ref 3.5–5.1)
Sodium: 139 mmol/L (ref 135–145)

## 2018-07-23 LAB — PROTIME-INR
INR: 1.2 (ref 0.8–1.2)
Prothrombin Time: 15 seconds (ref 11.4–15.2)

## 2018-07-23 NOTE — Progress Notes (Signed)
Anesthesia Chart Review:  Case:  [id number] Date/Time:  07/25/18 1226   Procedure:  Lumbar 4-5 Laminectomy (N/A Back) - Lumbar 4-5 Laminectomy   Anesthesia type:  General   Pre-op diagnosis:  Cauda Equina syndrome   Location:  [facility] OR ROOM 18 / [place] OR   Surgeon:  Kristeen Miss, MD      DISCUSSION: Patient is a 73 year old male scheduled for the above procedure. Recently seen by PCP for RLE weakness (imaging below). Neurosurgery recommended above surgery. DX: Cauda Equina Syndrome.   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  - S/p right inguinal hernia repair for incarcerated hernia on 05/27/18.  Normal PPM function 02/2018. Last Eliquis 07/21/18 which was dicussed with cardiology (see 07/23/18 note by Roby Lofts, PA-C). He denied SOB, cough, fever, chest pain at PAT RN visit. He does have a history of lung cancer as mentioned above with radiation and resection of LUL and RUL (partial), but did tolerate GETA for urgent/emergent hernia repair in March. Current case is posted for ~ 2 hours. Anesthesiologist to evaluate on the day of surgery, but if no acute changes then I would anticipate that he can proceed as planned.    VS: BP (!) 143/67   Pulse 65   Temp 36.7 C   Resp 20   Ht 5\' 11"  (1.803 m)   Wt 90.7 kg   SpO2 100%   BMI 27.89 kg/m     PROVIDERS: Lajean Manes, MD is PCP - Mertie Moores, MD is cardiologist. Last visit 01/14/18 with Richardson Dopp, PA-C. No angina. Six month follow-up  planned.   Cristopher Peru, MD is EP cardiologist. Last seen 02/19/18 by Tommye Standard, PA-C. PPM function intact, no programming changes made. Tyler Pita, MD is RAD-ONC. Last visit 06/18/17.   Betsy Coder, MD is HEM-ONC. Last visit seen was 12/24/14. Modesto Charon, MD is CT surgeon. Continued oncology follow-up recommended with PRN CT surgery at 02/29/16 visit.   LABS: Labs reviewed: Acceptable for surgery. (all labs ordered are listed, but only abnormal results are displayed)  Labs Reviewed  BASIC METABOLIC PANEL - Abnormal; Notable for the following components:      Result Value   Glucose, Bld 166 (*)    Calcium 8.7 (*)    All other components within normal limits  SURGICAL PCR SCREEN  CBC  PROTIME-INR    IMAGES: CT chest 07/10/18: IMPRESSION: - Treatment changes of the bilateral lungs without evidence of progression/recurrence. - Unchanged appearance of left upper lobectomy, residual scarring/tissue in the left hilar region extending to the pleura. - Unchanged appearance of 2 separate solid/sub solid masses of the right lower lobe, presumably treated sites of recurrence. - There is a new sub solid/ground-glass nodule of the anterior aspect of the right upper lobe, nonspecific, and potentially inflammatory. Recommend attention on future follow-up studies. - Similar appearance of circumferential thickening of the midthoracic esophagus, presumably inflammatory. Recommend correlation with a history of GERD. - Aortic Atherosclerosis (ICD10-I70.0) and Emphysema (ICD10-J43.9). Associated coronary artery disease.  CT L-spine 07/10/18: IMPRESSION: - The dominant  abnormality is at L4-5, where grade 2 spondylolisthesis is observed, related to BILATERAL L4 pars defects, superimposed facet arthropathy with bony overgrowth, and calcified central disc extrusion. Chronic arthrodesis across the L4-5 interspace, with severe spinal stenosis. BILATERAL L4 and L5 neural  impingement; asymmetrically severe impingement of the RIGHT L4 nerve root is noted related to calcified disc material. See discussion above. - Multilevel spondylosis as described. Potentially symptomatic neural impingement at other levels, including L1-2, and L2-3 as described. - Osseous metastatic disease to the lumbar spine is not observed. - Aortic Atherosclerosis (ICD10-I70.0).   EKG: 09/07/17: Ventricular paced rhythm. Underlying afib.   CV: Echo 05/11/16: Study Conclusions - Left ventricle: The cavity size was normal. Wall thickness was   increased in a pattern of moderate LVH. Systolic function was   normal. The estimated ejection fraction was in the range of 55%   to 60%. Wall motion was normal; there were no regional wall   motion abnormalities. - Aortic valve: There was trivial regurgitation. - Left atrium: The atrium was moderately dilated. - Right ventricle: The cavity size was mildly dilated. - Right atrium: The atrium was mildly dilated. - Pulmonary arteries: Systolic pressure was mildly increased. Impressions: - Normal LV systolic function; moderate LVH; sclerotic aortic valve   with trace AI; mildly dilated aortic root; moderate LAE; mild RAE   and RVE; mild TR with mildly elevated pulmonary pressure.  Nuclear stress test 08/13/13: Overall Impression:  Intermediate risk stress nuclear study with a scar in the basal and mid inferolateral walls and apical lateral wall with possible mild periinfarct ischemia. The interpretantion of the study is affected by significant extracardiac activity.  LV Ejection Fraction: Study not gated.  LV Wall Motion:  Study not gated (Dr. Acie Fredrickson reviewed results and evaluated Mr. Julien Girt. Patient was limited by his lung disease but with no further angina. Symptoms felt stable and medical therapy recommended at that time.)  Cardiac cath 10/17/12: LM: No obstructive disease. LAD: 50% mid LAD. 50% proximal D1. D2/D3 are small with mild proximal  disease. CX: 100% proximal CX after a very early moderate caliber OM. OM! 30%. After PCI, the mid AV groove CX is seen to fill three OM branches. AV groove CX with diffuse 80-90% stenosis between the takeoff of the OM2 and OM3 branches, but appearance same as in 2008 cath. RCA: 90% mid stenosis with appearance of unstable plaque. LV: LVEF 50-55%. Impression: 1. Triple vessel CAD with NSTEMI secondary to acute occlusion of Circumflex.  2. Severe stenosis, unstable plaque mid RCA 3. NSTEMI 4. Preserved LV systolic function 5. Successful PTCA/DES x 1 mid RCA 6. Successful PTCA/DES x 1 proximal Circumflex Recommendations: He will need dual anti-platelet therapy with ASA and Effient for one year. Continue statin. No beta blocker with heart block.    Past Medical History:  Diagnosis Date  . Adenopathy    RIGHT ADRENAL  . Anxiety   . Arthritis    KNEES/HANDS  . CAD (coronary artery disease)    a. Prior known CTO of LCX with recanalizationby cath 2008;  b. 09/2012 NSTEMI/Cath/PCI: LM nl, LAD 9m, D1 50p, LCX 100p (2.75x16 Promus Premier DES), 80-76m, OM1 30, RCA 73m (3.0x16 Promus Premier DES), EF 50-55%.  [name] History of echocardiogram    Echo 2/18: Moderate LVH, EF 55-60, normal wall motion, trivial AI, moderate LAE, mild RVE, mild RAE, mildly elevated pulmonary pressure  . Hx of melanoma of skin   . 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 Non-small cell lung cancer (  [place])    a. s/p resection and XRT.  [name] PAF (paroxysmal atrial fibrillation) ([place])    a. not currently on anticoagulation (09/2012)  . Presence of permanent cardiac pacemaker   . 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 ADRENALEXTOMY  02/2000  . CARDIAC CATHETERIZATION  2008   LAD 40, D1 OK, D2 50, CFX 90, RCA OK, EF 60%; Consider PCI PRN but the stenosis is quite complex and we would end up jailing several large posterolateral branches    . HERNIA REPAIR   05/27/2018  . INGUINAL HERNIA REPAIR Right 05/27/2018   Procedure: INCARCERATED RIGHT INGUINAL HERNIA REPAIR;  Surgeon: Erroll Luna, MD;  Location: [facility];  Service: General;  Laterality: Right;  . LEFT HEART CATHETERIZATION WITH CORONARY ANGIOGRAM N/A 10/17/2012   Procedure: LEFT HEART CATHETERIZATION WITH CORONARY ANGIOGRAM;  Surgeon: Sherren Mocha, MD;  Location[facility] CATH LAB;  Service: Cardiovascular;  Laterality: N/A;  . LOBECTOMY LEFT UPPER LOBE  08/15/2001  . PACEMAKER IMPLANT N/A 08/29/2016   Procedure: Pacemaker Implant;  Surgeon: Evans Lance, MD;  Location: [facility] CV LAB;  Service: Cardiovascular;  Laterality: N/A;  . Right VATS, mini thoracotomy, wedge resection of right upper  01/18/2004    MEDICATIONS: . acetaminophen (TYLENOL) 325 MG tablet  . atorvastatin (LIPITOR) 20 MG tablet  . clonazePAM (KLONOPIN) 1 MG tablet  . ELIQUIS 5 MG TABS tablet  . metoprolol succinate (TOPROL-XL) 25 MG 24 hr tablet  . Multiple Vitamins-Minerals (PRESERVISION AREDS 2) CAPS  . nitroGLYCERIN (NITROSTAT) 0.4 MG SL tablet  . traMADol (ULTRAM) 50 MG tablet   No current facility-administered medications for this encounter.     Myra Gianotti, PA-C Surgical Short Stay/Anesthesiology [facility] Phone [phone] [facility] Phone [phone] 07/23/2018 4:11 PM

## 2018-07-23 NOTE — Progress Notes (Signed)
PCP - Dr. Lajean Manes Cardiologist - Dr. Acie Fredrickson  Chest x-ray - N/A EKG - 09/07/17 Stress Test - denies ECHO - 05/11/16 Cardiac Cath - 10/17/12  Sleep Study - denies   Blood Thinner Instructions: last dose of Eliquis 07/21/18 Aspirin Instructions: Patient instructed to hold all Aspirin, NSAID's, herbal medications, fish oil and vitamins 7 days prior to surgery.   Anesthesia review: cardiac history  Patient denies shortness of breath, fever, cough and chest pain at PAT appointment   Patient aware following surgery he will need to be in the care of another adult for 24 hours, cannot drive, operate heavy machinery, drink ETOH, or make any legal decisions.  Patient verbalized understanding of instructions that were given to them at the PAT appointment. Patient was also instructed that they will need to review over the PAT instructions again at home before surgery.

## 2018-07-23 NOTE — Telephone Encounter (Signed)
   Primary Cardiologist: Mertie Moores, MD  Chart reviewed as part of pre-operative protocol coverage. Patient called to discuss pharmacy recommendations to hold eliquis 3 days prior to his upcoming surgery. Patient states he began holding eliquis 07/22/2018 per his surgeons recommendations. Instructed patient to continue holding until his surgeon clears him to restart this medication.   I will route this recommendation to the requesting party via Epic fax function and remove from pre-op pool.  Please call with questions.  Abigail Butts, PA-C 07/23/2018, 8:59 AM

## 2018-07-23 NOTE — Telephone Encounter (Signed)
Pt takes Eliquis for afib with CHADS2VASc score of 3 (age x2, CAD). Renal function is normal. Recommend holding Eliquis for 3 days prior to spinal procedure per protocol. Received clearance 2 days prior to procedure - will need to ensure pt started holding Eliquis yesterday.

## 2018-07-23 NOTE — Anesthesia Preprocedure Evaluation (Addendum)
Anesthesia Evaluation  Patient identified by MRN, date of birth, ID band Patient awake    Reviewed: Allergy & Precautions, NPO status , Patient's Chart, lab work & pertinent test results, reviewed documented beta blocker date and time   Airway Mallampati: III  TM Distance: >3 FB Neck ROM: Full    Dental no notable dental hx. (+) Teeth Intact, Dental Advisory Given   Pulmonary neg pulmonary ROS, former smoker,    Pulmonary exam normal breath sounds clear to auscultation       Cardiovascular + CAD, + Past MI and + Cardiac Stents  Normal cardiovascular exam+ dysrhythmias + pacemaker  Rhythm:Regular Rate:Normal     Neuro/Psych PSYCHIATRIC DISORDERS Anxiety negative neurological ROS     GI/Hepatic negative GI ROS, Neg liver ROS,   Endo/Other  negative endocrine ROS  Renal/GU negative Renal ROS  negative genitourinary   Musculoskeletal  (+) Arthritis , Osteoarthritis,    Abdominal   Peds negative pediatric ROS (+)  Hematology negative hematology ROS (+)   Anesthesia Other Findings   Reproductive/Obstetrics negative OB ROS                           Anesthesia Physical Anesthesia Plan  ASA: III  Anesthesia Plan: General   Post-op Pain Management:    Induction: Intravenous  PONV Risk Score and Plan: 2 and Ondansetron, Dexamethasone and Treatment may vary due to age or medical condition  Airway Management Planned: Oral ETT  Additional Equipment:   Intra-op Plan:   Post-operative Plan: Extubation in OR  Informed Consent: I have reviewed the patients History and Physical, chart, labs and discussed the procedure including the risks, benefits and alternatives for the proposed anesthesia with the patient or authorized representative who has indicated his/her understanding and acceptance.     Dental advisory given  Plan Discussed with: CRNA  Anesthesia Plan Comments: (PAT note written  07/23/2018 by Myra Gianotti, PA-C. )       Anesthesia Quick Evaluation

## 2018-07-25 ENCOUNTER — Encounter (HOSPITAL_COMMUNITY): Payer: Self-pay | Admitting: *Deleted

## 2018-07-25 ENCOUNTER — Telehealth: Payer: Self-pay

## 2018-07-25 ENCOUNTER — Ambulatory Visit (HOSPITAL_COMMUNITY): Payer: Medicare Other

## 2018-07-25 ENCOUNTER — Other Ambulatory Visit: Payer: Self-pay

## 2018-07-25 ENCOUNTER — Encounter (HOSPITAL_COMMUNITY)
Admission: RE | Disposition: A | Discharge status: Skilled Nursing Facility | Payer: Self-pay | Source: Home / Self Care | Attending: Neurological Surgery

## 2018-07-25 ENCOUNTER — Ambulatory Visit (HOSPITAL_COMMUNITY): Payer: Medicare Other | Admitting: Vascular Surgery

## 2018-07-25 ENCOUNTER — Inpatient Hospital Stay (HOSPITAL_COMMUNITY)
Admission: RE | Admit: 2018-07-25 | Discharge: 2018-07-31 | DRG: 515 | Disposition: A | Discharge status: Skilled Nursing Facility | Payer: Medicare Other | Attending: Neurological Surgery | Admitting: Neurological Surgery

## 2018-07-25 ENCOUNTER — Ambulatory Visit (HOSPITAL_COMMUNITY): Payer: Medicare Other | Admitting: Certified Registered"

## 2018-07-25 DIAGNOSIS — I255 Ischemic cardiomyopathy: Secondary | ICD-10-CM | POA: Diagnosis present

## 2018-07-25 DIAGNOSIS — Z955 Presence of coronary angioplasty implant and graft: Secondary | ICD-10-CM

## 2018-07-25 DIAGNOSIS — I442 Atrioventricular block, complete: Secondary | ICD-10-CM | POA: Diagnosis not present

## 2018-07-25 DIAGNOSIS — M48061 Spinal stenosis, lumbar region without neurogenic claudication: Secondary | ICD-10-CM | POA: Diagnosis present

## 2018-07-25 DIAGNOSIS — R509 Fever, unspecified: Secondary | ICD-10-CM

## 2018-07-25 DIAGNOSIS — G629 Polyneuropathy, unspecified: Secondary | ICD-10-CM | POA: Diagnosis present

## 2018-07-25 DIAGNOSIS — Z419 Encounter for procedure for purposes other than remedying health state, unspecified: Secondary | ICD-10-CM | POA: Diagnosis not present

## 2018-07-25 DIAGNOSIS — Z7901 Long term (current) use of anticoagulants: Secondary | ICD-10-CM | POA: Diagnosis not present

## 2018-07-25 DIAGNOSIS — R531 Weakness: Secondary | ICD-10-CM | POA: Diagnosis present

## 2018-07-25 DIAGNOSIS — A419 Sepsis, unspecified organism: Secondary | ICD-10-CM | POA: Diagnosis not present

## 2018-07-25 DIAGNOSIS — I4821 Permanent atrial fibrillation: Secondary | ICD-10-CM | POA: Diagnosis not present

## 2018-07-25 DIAGNOSIS — G834 Cauda equina syndrome: Secondary | ICD-10-CM | POA: Diagnosis present

## 2018-07-25 DIAGNOSIS — N390 Urinary tract infection, site not specified: Secondary | ICD-10-CM | POA: Diagnosis not present

## 2018-07-25 DIAGNOSIS — M4316 Spondylolisthesis, lumbar region: Secondary | ICD-10-CM | POA: Diagnosis present

## 2018-07-25 DIAGNOSIS — I252 Old myocardial infarction: Secondary | ICD-10-CM | POA: Diagnosis not present

## 2018-07-25 DIAGNOSIS — Z885 Allergy status to narcotic agent status: Secondary | ICD-10-CM

## 2018-07-25 DIAGNOSIS — Z20828 Contact with and (suspected) exposure to other viral communicable diseases: Secondary | ICD-10-CM | POA: Diagnosis not present

## 2018-07-25 DIAGNOSIS — Z85118 Personal history of other malignant neoplasm of bronchus and lung: Secondary | ICD-10-CM | POA: Diagnosis not present

## 2018-07-25 DIAGNOSIS — Z950 Presence of cardiac pacemaker: Secondary | ICD-10-CM

## 2018-07-25 DIAGNOSIS — Z923 Personal history of irradiation: Secondary | ICD-10-CM

## 2018-07-25 DIAGNOSIS — I2510 Atherosclerotic heart disease of native coronary artery without angina pectoris: Secondary | ICD-10-CM | POA: Diagnosis present

## 2018-07-25 DIAGNOSIS — M47816 Spondylosis without myelopathy or radiculopathy, lumbar region: Secondary | ICD-10-CM | POA: Diagnosis not present

## 2018-07-25 DIAGNOSIS — R600 Localized edema: Secondary | ICD-10-CM

## 2018-07-25 DIAGNOSIS — M109 Gout, unspecified: Secondary | ICD-10-CM | POA: Diagnosis not present

## 2018-07-25 HISTORY — PX: LUMBAR LAMINECTOMY/DECOMPRESSION MICRODISCECTOMY: SHX5026

## 2018-07-25 HISTORY — DX: Dependence on other enabling machines and devices: Z99.89

## 2018-07-25 SURGERY — LUMBAR LAMINECTOMY/DECOMPRESSION MICRODISCECTOMY 1 LEVEL
Anesthesia: General | Site: Back

## 2018-07-25 MED ORDER — ONDANSETRON HCL 4 MG/2ML IJ SOLN
INTRAMUSCULAR | Status: AC
  Filled 2018-07-25: qty 2

## 2018-07-25 MED ORDER — SENNA 8.6 MG PO TABS
1.0000 | ORAL_TABLET | Freq: Two times a day (BID) | ORAL | Status: DC
  Administered 2018-07-25 – 2018-07-29 (×8): 8.6 mg via ORAL
  Filled 2018-07-25 (×9): qty 1

## 2018-07-25 MED ORDER — SUCCINYLCHOLINE CHLORIDE 200 MG/10ML IV SOSY
PREFILLED_SYRINGE | INTRAVENOUS | Status: DC | PRN
  Administered 2018-07-25: 100 mg via INTRAVENOUS

## 2018-07-25 MED ORDER — ACETAMINOPHEN 650 MG RE SUPP
650.0000 mg | RECTAL | Status: DC | PRN
  Filled 2018-07-25: qty 1

## 2018-07-25 MED ORDER — DEXAMETHASONE SODIUM PHOSPHATE 10 MG/ML IJ SOLN
INTRAMUSCULAR | Status: AC
  Filled 2018-07-25: qty 1

## 2018-07-25 MED ORDER — SUCCINYLCHOLINE CHLORIDE 200 MG/10ML IV SOSY
PREFILLED_SYRINGE | INTRAVENOUS | Status: AC
  Filled 2018-07-25: qty 10

## 2018-07-25 MED ORDER — FENTANYL CITRATE (PF) 250 MCG/5ML IJ SOLN
INTRAMUSCULAR | Status: DC | PRN
  Administered 2018-07-25 (×3): 50 ug via INTRAVENOUS

## 2018-07-25 MED ORDER — DEXAMETHASONE SODIUM PHOSPHATE 10 MG/ML IJ SOLN
INTRAMUSCULAR | Status: DC | PRN
  Administered 2018-07-25: 10 mg via INTRAVENOUS

## 2018-07-25 MED ORDER — SODIUM CHLORIDE 0.9 % IV SOLN: 250.0000 mL | INTRAVENOUS | Status: DC

## 2018-07-25 MED ORDER — FLEET ENEMA 7-19 GM/118ML RE ENEM: 1.0000 | ENEMA | Freq: Once | RECTAL | Status: DC | PRN

## 2018-07-25 MED ORDER — LIDOCAINE-EPINEPHRINE 1 %-1:100000 IJ SOLN
INTRAMUSCULAR | Status: DC | PRN
  Administered 2018-07-25: 5 mL

## 2018-07-25 MED ORDER — MORPHINE SULFATE (PF) 2 MG/ML IV SOLN: 2.0000 mg | INTRAVENOUS | Status: DC | PRN

## 2018-07-25 MED ORDER — SODIUM CHLORIDE 0.9 % IV SOLN
INTRAVENOUS | Status: DC | PRN
  Administered 2018-07-25: 20 ug/min via INTRAVENOUS

## 2018-07-25 MED ORDER — CHLORHEXIDINE GLUCONATE CLOTH 2 % EX PADS: 6.0000 | MEDICATED_PAD | Freq: Once | CUTANEOUS | Status: DC

## 2018-07-25 MED ORDER — PROPOFOL 10 MG/ML IV BOLUS
INTRAVENOUS | Status: DC | PRN
  Administered 2018-07-25: 130 mg via INTRAVENOUS

## 2018-07-25 MED ORDER — THROMBIN 5000 UNITS EX SOLR
OROMUCOSAL | Status: DC | PRN
  Administered 2018-07-25: 13:00:00 via TOPICAL

## 2018-07-25 MED ORDER — ALUM & MAG HYDROXIDE-SIMETH 200-200-20 MG/5ML PO SUSP: 30.0000 mL | Freq: Four times a day (QID) | ORAL | Status: DC | PRN

## 2018-07-25 MED ORDER — LIDOCAINE 2% (20 MG/ML) 5 ML SYRINGE
INTRAMUSCULAR | Status: DC | PRN
  Administered 2018-07-25: 100 mg via INTRAVENOUS

## 2018-07-25 MED ORDER — ROCURONIUM BROMIDE 10 MG/ML (PF) SYRINGE
PREFILLED_SYRINGE | INTRAVENOUS | Status: AC
  Filled 2018-07-25: qty 10

## 2018-07-25 MED ORDER — OXYCODONE-ACETAMINOPHEN 5-325 MG PO TABS
1.0000 | ORAL_TABLET | ORAL | Status: DC | PRN
  Administered 2018-07-25 – 2018-07-26 (×2): 1 via ORAL
  Administered 2018-07-27: 2 via ORAL
  Filled 2018-07-25: qty 1
  Filled 2018-07-25: qty 2
  Filled 2018-07-25: qty 1
  Filled 2018-07-25: qty 2

## 2018-07-25 MED ORDER — POLYETHYLENE GLYCOL 3350 17 G PO PACK
17.0000 g | PACK | Freq: Every day | ORAL | Status: DC | PRN
  Administered 2018-07-27 – 2018-07-28 (×2): 17 g via ORAL
  Filled 2018-07-25 (×2): qty 1

## 2018-07-25 MED ORDER — PROPOFOL 10 MG/ML IV BOLUS
INTRAVENOUS | Status: AC
  Filled 2018-07-25: qty 20

## 2018-07-25 MED ORDER — BISACODYL 10 MG RE SUPP: 10.0000 mg | Freq: Every day | RECTAL | Status: DC | PRN

## 2018-07-25 MED ORDER — ACETAMINOPHEN 325 MG PO TABS
650.0000 mg | ORAL_TABLET | ORAL | Status: DC | PRN
  Administered 2018-07-26 – 2018-07-27 (×2): 650 mg via ORAL
  Administered 2018-07-28: 325 mg via ORAL
  Administered 2018-07-29 – 2018-07-30 (×4): 650 mg via ORAL
  Filled 2018-07-25 (×8): qty 2

## 2018-07-25 MED ORDER — MENTHOL 3 MG MT LOZG: 1.0000 | LOZENGE | OROMUCOSAL | Status: DC | PRN

## 2018-07-25 MED ORDER — PRESERVISION AREDS 2 PO CAPS: 1.0000 | ORAL_CAPSULE | Freq: Two times a day (BID) | ORAL | Status: DC

## 2018-07-25 MED ORDER — BUPIVACAINE HCL (PF) 0.5 % IJ SOLN
INTRAMUSCULAR | Status: DC | PRN
  Administered 2018-07-25: 20 mL
  Administered 2018-07-25: 5 mL

## 2018-07-25 MED ORDER — CEFAZOLIN SODIUM-DEXTROSE 2-4 GM/100ML-% IV SOLN
INTRAVENOUS | Status: AC
  Filled 2018-07-25: qty 100

## 2018-07-25 MED ORDER — THROMBIN 5000 UNITS EX SOLR
CUTANEOUS | Status: AC
  Filled 2018-07-25: qty 5000

## 2018-07-25 MED ORDER — FENTANYL CITRATE (PF) 250 MCG/5ML IJ SOLN
INTRAMUSCULAR | Status: AC
  Filled 2018-07-25: qty 5

## 2018-07-25 MED ORDER — ONDANSETRON HCL 4 MG/2ML IJ SOLN: 4.0000 mg | Freq: Four times a day (QID) | INTRAMUSCULAR | Status: DC | PRN

## 2018-07-25 MED ORDER — CEFAZOLIN SODIUM-DEXTROSE 2-4 GM/100ML-% IV SOLN
2.0000 g | Freq: Three times a day (TID) | INTRAVENOUS | Status: AC
  Administered 2018-07-25 – 2018-07-26 (×2): 2 g via INTRAVENOUS
  Filled 2018-07-25 (×2): qty 100

## 2018-07-25 MED ORDER — ATORVASTATIN CALCIUM 10 MG PO TABS
20.0000 mg | ORAL_TABLET | Freq: Every day | ORAL | Status: DC
  Administered 2018-07-25 – 2018-07-31 (×7): 20 mg via ORAL
  Filled 2018-07-25 (×7): qty 2

## 2018-07-25 MED ORDER — FUROSEMIDE 10 MG/ML IJ SOLN
20.0000 mg | Freq: Once | INTRAMUSCULAR | Status: AC
  Administered 2018-07-25: 20 mg via INTRAVENOUS
  Filled 2018-07-25: qty 2

## 2018-07-25 MED ORDER — METHOCARBAMOL 500 MG PO TABS
500.0000 mg | ORAL_TABLET | Freq: Four times a day (QID) | ORAL | Status: DC | PRN
  Administered 2018-07-25 – 2018-07-30 (×3): 500 mg via ORAL
  Filled 2018-07-25 (×3): qty 1

## 2018-07-25 MED ORDER — ROCURONIUM BROMIDE 10 MG/ML (PF) SYRINGE
PREFILLED_SYRINGE | INTRAVENOUS | Status: DC | PRN
  Administered 2018-07-25: 50 mg via INTRAVENOUS

## 2018-07-25 MED ORDER — NITROGLYCERIN 0.4 MG SL SUBL: 0.4000 mg | SUBLINGUAL_TABLET | SUBLINGUAL | Status: DC | PRN

## 2018-07-25 MED ORDER — ACETAMINOPHEN 10 MG/ML IV SOLN
1000.0000 mg | Freq: Once | INTRAVENOUS | Status: AC
  Administered 2018-07-25: 1000 mg via INTRAVENOUS

## 2018-07-25 MED ORDER — FENTANYL CITRATE (PF) 100 MCG/2ML IJ SOLN: 25.0000 ug | INTRAMUSCULAR | Status: DC | PRN

## 2018-07-25 MED ORDER — SODIUM CHLORIDE 0.9% FLUSH
3.0000 mL | Freq: Two times a day (BID) | INTRAVENOUS | Status: DC
  Administered 2018-07-25 – 2018-07-31 (×13): 3 mL via INTRAVENOUS

## 2018-07-25 MED ORDER — BUPIVACAINE HCL (PF) 0.5 % IJ SOLN
INTRAMUSCULAR | Status: AC
  Filled 2018-07-25: qty 30

## 2018-07-25 MED ORDER — LIDOCAINE 2% (20 MG/ML) 5 ML SYRINGE
INTRAMUSCULAR | Status: AC
  Filled 2018-07-25: qty 5

## 2018-07-25 MED ORDER — METOPROLOL SUCCINATE ER 25 MG PO TB24
25.0000 mg | ORAL_TABLET | Freq: Every day | ORAL | Status: DC
  Administered 2018-07-26 – 2018-07-31 (×5): 25 mg via ORAL
  Filled 2018-07-25 (×6): qty 1

## 2018-07-25 MED ORDER — SODIUM CHLORIDE 0.9 % IV SOLN
INTRAVENOUS | Status: DC | PRN
  Administered 2018-07-25: 13:00:00

## 2018-07-25 MED ORDER — METHOCARBAMOL 1000 MG/10ML IJ SOLN
500.0000 mg | Freq: Four times a day (QID) | INTRAVENOUS | Status: DC | PRN
  Filled 2018-07-25: qty 5

## 2018-07-25 MED ORDER — ONDANSETRON HCL 4 MG/2ML IJ SOLN
INTRAMUSCULAR | Status: DC | PRN
  Administered 2018-07-25: 4 mg via INTRAVENOUS

## 2018-07-25 MED ORDER — THROMBIN 5000 UNITS EX SOLR
CUTANEOUS | Status: DC | PRN
  Administered 2018-07-25: 10000 [IU] via TOPICAL

## 2018-07-25 MED ORDER — CEFAZOLIN SODIUM-DEXTROSE 2-4 GM/100ML-% IV SOLN
2.0000 g | INTRAVENOUS | Status: AC
  Administered 2018-07-25: 2 g via INTRAVENOUS

## 2018-07-25 MED ORDER — LIDOCAINE-EPINEPHRINE 1 %-1:100000 IJ SOLN
INTRAMUSCULAR | Status: AC
  Filled 2018-07-25: qty 1

## 2018-07-25 MED ORDER — THROMBIN 5000 UNITS EX SOLR
CUTANEOUS | Status: AC
  Filled 2018-07-25: qty 10000

## 2018-07-25 MED ORDER — HEMOSTATIC AGENTS (NO CHARGE) OPTIME
TOPICAL | Status: DC | PRN
  Administered 2018-07-25: 1 via TOPICAL

## 2018-07-25 MED ORDER — ONDANSETRON HCL 4 MG PO TABS: 4.0000 mg | ORAL_TABLET | Freq: Four times a day (QID) | ORAL | Status: DC | PRN

## 2018-07-25 MED ORDER — LACTATED RINGERS IV SOLN
INTRAVENOUS | Status: DC
  Administered 2018-07-25: 11:00:00 via INTRAVENOUS

## 2018-07-25 MED ORDER — 0.9 % SODIUM CHLORIDE (POUR BTL) OPTIME
TOPICAL | Status: DC | PRN
  Administered 2018-07-25: 1000 mL

## 2018-07-25 MED ORDER — DOCUSATE SODIUM 100 MG PO CAPS
100.0000 mg | ORAL_CAPSULE | Freq: Two times a day (BID) | ORAL | Status: DC
  Administered 2018-07-25 – 2018-07-31 (×12): 100 mg via ORAL
  Filled 2018-07-25 (×13): qty 1

## 2018-07-25 MED ORDER — CLONAZEPAM 1 MG PO TABS
1.0000 mg | ORAL_TABLET | Freq: Three times a day (TID) | ORAL | Status: DC
  Administered 2018-07-25 – 2018-07-31 (×19): 1 mg via ORAL
  Filled 2018-07-25 (×19): qty 1

## 2018-07-25 MED ORDER — ACETAMINOPHEN 10 MG/ML IV SOLN
INTRAVENOUS | Status: AC
  Filled 2018-07-25: qty 100

## 2018-07-25 MED ORDER — SUGAMMADEX SODIUM 200 MG/2ML IV SOLN
INTRAVENOUS | Status: DC | PRN
  Administered 2018-07-25: 181.4 mg via INTRAVENOUS

## 2018-07-25 MED ORDER — SODIUM CHLORIDE 0.9% FLUSH: 3.0000 mL | INTRAVENOUS | Status: DC | PRN

## 2018-07-25 MED ORDER — PHENOL 1.4 % MT LIQD: 1.0000 | OROMUCOSAL | Status: DC | PRN

## 2018-07-25 SURGICAL SUPPLY — 49 items
ADH SKN CLS APL DERMABOND .7 (GAUZE/BANDAGES/DRESSINGS) ×1
BAG DECANTER FOR FLEXI CONT (MISCELLANEOUS) ×3 IMPLANT
BLADE CLIPPER SURG (BLADE) IMPLANT
BUR ACORN 6.0 (BURR) IMPLANT
BUR ACORN 6.0MM (BURR)
BUR MATCHSTICK NEURO 3.0 LAGG (BURR) ×3 IMPLANT
CANISTER SUCT 3000ML PPV (MISCELLANEOUS) ×3 IMPLANT
COVER WAND RF STERILE (DRAPES) ×3 IMPLANT
DECANTER SPIKE VIAL GLASS SM (MISCELLANEOUS) ×3 IMPLANT
DERMABOND ADVANCED (GAUZE/BANDAGES/DRESSINGS) ×2
DERMABOND ADVANCED .7 DNX12 (GAUZE/BANDAGES/DRESSINGS) ×1 IMPLANT
DEVICE DISSECT PLASMABLAD 3.0S (MISCELLANEOUS) ×1 IMPLANT
DRAPE HALF SHEET 40X57 (DRAPES) IMPLANT
DRAPE LAPAROTOMY T 102X78X121 (DRAPES) ×3 IMPLANT
DRAPE MICROSCOPE LEICA (MISCELLANEOUS) IMPLANT
DRSG OPSITE POSTOP 4X6 (GAUZE/BANDAGES/DRESSINGS) ×2 IMPLANT
DURAPREP 26ML APPLICATOR (WOUND CARE) ×3 IMPLANT
ELECT REM PT RETURN 9FT ADLT (ELECTROSURGICAL) ×3
ELECTRODE REM PT RTRN 9FT ADLT (ELECTROSURGICAL) ×1 IMPLANT
GAUZE 4X4 16PLY RFD (DISPOSABLE) IMPLANT
GAUZE SPONGE 4X4 12PLY STRL (GAUZE/BANDAGES/DRESSINGS) ×3 IMPLANT
GLOVE BIOGEL PI IND STRL 8.5 (GLOVE) ×1 IMPLANT
GLOVE BIOGEL PI INDICATOR 8.5 (GLOVE) ×2
GLOVE ECLIPSE 8.5 STRL (GLOVE) ×3 IMPLANT
GOWN STRL REUS W/ TWL LRG LVL3 (GOWN DISPOSABLE) IMPLANT
GOWN STRL REUS W/ TWL XL LVL3 (GOWN DISPOSABLE) IMPLANT
GOWN STRL REUS W/TWL 2XL LVL3 (GOWN DISPOSABLE) ×3 IMPLANT
GOWN STRL REUS W/TWL LRG LVL3 (GOWN DISPOSABLE)
GOWN STRL REUS W/TWL XL LVL3 (GOWN DISPOSABLE)
KIT BASIN OR (CUSTOM PROCEDURE TRAY) ×3 IMPLANT
KIT TURNOVER KIT B (KITS) ×3 IMPLANT
NDL SPNL 20GX3.5 QUINCKE YW (NEEDLE) IMPLANT
NEEDLE HYPO 22GX1.5 SAFETY (NEEDLE) ×3 IMPLANT
NEEDLE SPNL 20GX3.5 QUINCKE YW (NEEDLE) IMPLANT
NS IRRIG 1000ML POUR BTL (IV SOLUTION) ×3 IMPLANT
PACK LAMINECTOMY NEURO (CUSTOM PROCEDURE TRAY) ×3 IMPLANT
PAD ARMBOARD 7.5X6 YLW CONV (MISCELLANEOUS) ×9 IMPLANT
PATTIES SURGICAL .5 X1 (DISPOSABLE) ×3 IMPLANT
PLASMABLADE 3.0S (MISCELLANEOUS) ×3
RUBBERBAND STERILE (MISCELLANEOUS) IMPLANT
SPONGE SURGIFOAM ABS GEL SZ50 (HEMOSTASIS) ×3 IMPLANT
SUT VIC AB 1 CT1 18XBRD ANBCTR (SUTURE) ×1 IMPLANT
SUT VIC AB 1 CT1 8-18 (SUTURE) ×3
SUT VIC AB 2-0 CP2 18 (SUTURE) ×3 IMPLANT
SUT VIC AB 3-0 SH 8-18 (SUTURE) ×3 IMPLANT
TOWEL GREEN STERILE (TOWEL DISPOSABLE) ×3 IMPLANT
TOWEL GREEN STERILE FF (TOWEL DISPOSABLE) ×3 IMPLANT
TRAY FOLEY MTR SLVR 16FR STAT (SET/KITS/TRAYS/PACK) ×2 IMPLANT
WATER STERILE IRR 1000ML POUR (IV SOLUTION) ×3 IMPLANT

## 2018-07-25 NOTE — Op Note (Signed)
Date of surgery: 07/25/2018 Preoperative diagnosis: Lumbar spondylolisthesis with stenosis L4-L5 Postoperative diagnosis: Same Procedure: Lumbar laminectomy L4-L5 with decompression of the L4 and L5 nerve roots Surgeon: Kristeen Miss M.D. Assistant: None Anesthesia: Gen. endotracheal Indications: Benjamin [name] is a 73 year old individual whose had significant lumbar spondylosis and spondylolisthesis with severe stenosis causing a cauda equina syndrome.  Patient has autofused L4 and L5.  He was advised regarding the need for surgical decompression.  Patient has number of comorbidities including severe fluid retention with peripheral edema atrial fibrillation he has placement of a pacemaker also.  Procedure: Patient was brought to the operating room supine on a stretcher. After the smooth induction of general endotracheal anesthesia he was turned prone onto the operating table. The back was prepped with alcohol and DuraPrep and draped in a sterile fashion. Localizing radiographs identified the interspace at L4-L5. A midline incision was created and carried down to the lumbar dorsal fascia which was opened on either side of midline at this level. The dissection was carried out over the interlaminar space and the facet joints at L4-5. A self-retaining retractor was placed in the wound. A high-speed drill was then used to remove the inferior margin of the lamina out to the medial wall the facet performing the initial portion of the dissection. The yellow ligament was then taken up and removed. Common dural tube was identified and dissection was carefully undertaken removing redundant yellow ligament and overgrown facet from the superior facet of L4 and the laminar arch of L3. A foraminotomy was created over the L4 nerve root.  Once an adequate decompression was identified and secured, hemostasis and the soft tissues obtained meticulously and when verified retractor was removed the wound was irrigated copiously  with antibiotic irrigating solution, and then the lumbar dorsal fascia was closed with #1 Vicryl in interrupted fashion.  20 mL of half percent Marcaine was injected into the paraspinous fascia. 2-0 Vicryl was used to close the subcutaneous fascia and 3-0 Vicryl was used to close the subcuticular skin. Blood loss was estimated as 200 mL. The patient was returned to the recovery room in stable condition

## 2018-07-25 NOTE — H&P (Signed)
CHIEF COMPLAINT: Bilateral leg weakness, balance difficulties, and difficulty walking, worsening in the past month.  HISTORY OF PRESENT ILLNESS: Benjamin [name]. Benjamin [name] is a 73 year old right-handed individual, who notes that he has had a complicated medical history.  He notes that he has beaten lung cancer on 5 or 6 occasions, starting in 2003.  He had initial presentation with cancer on the left side and Dr. Arlyce Dice operated on him.  Then, he had the procedure repeated on the right side in 2005.  Since that time, he has had a number of radiation treatments.  He has had a pacemaker that was placed in 2018 and he has been on a chronic blood thinner with Eliquis since that time. Just recently in March, he had surgery to repair an inguinal hernia on the right side by Dr. Christie Beckers and afterward he had severe swelling in that right lower extremity.  Severe swelling in both lower extremities has been a problem to some degree secondary aggravated by gout, which he has significantly.  He notes that his current medications include Metoprolol EQ, Eliquis, Atorvastatin, and Clonazepam.  He notes an allergy to Codeine.  He notes that on and off he has had some issues of his back and he notes that he has had a chronic hip issue for a number of years, and was told that ultimately he may require a hip replacement. Here recently though in the last month or so he notes that the strength in his legs has given way, such that he has great difficulty with his balance and his ability to ambulate independently has gone away. Here in the last week or two his brother had brought him a rolling walker and he needs to use this to maintain his balance.  He finds that activities of daily living, such as shaving himself and cleaning himself in the morning, are nearly impossible because he has great difficulty standing to do these things.  Furthermore, he notes that since the hernia surgery he feels his bowel control and his bladder control  has changed, in that starting a urinary stream is greatly difficult for him.  He does not quite feel like he is emptying all the way. Because of the complaints of the weakness in his legs, Dr. Lajean Manes had ordered a CT scan that was performed on the 22nd.  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 He has severe subarticular stenosis for the L5 nerve root, in addition to bilateral L4 nerve root stenosis in the foramina.  I note that with the bony fusion that is present, he should have good stability of his spine and the biggest problem that he has is nerve root impingement.  PHYSICAL EXAMINATION: Today, I note that the patient has no dorsiflexor function on the right side.  He has trace extensor hallucis longus function on the left side, graded at 2/5.  His iliopsoas and his quads have good strength proximally.  He has absent reflexes in the patellae and the Achilles both. Gastroc strength is 4/5 bilaterally.  He has marked 4+ edema in the right leg and 3+ edema in the left leg.  His upper extremity strength and reflexes are intact.  His cranial nerve examination is normal.  SYSTEMS REVIEW: Notable for the balance disturbance, history of cataracts, some shortness of breath, swelling in his feet and hands, leg pain while walking, an irregular pulse, arthritis, leg weakness, back pain, leg pain, joint  pain and swelling, and the difficulty with urinary stream on a 14-point review sheet.  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  He is at the point where he is getting along with great difficulty using a walker, and I believe that he needs surgical decompression of L4-L5 to be done urgently.  The bowel and bladder problems are also bothersome, and may be part of a cauda equina syndrome that he is experiencing.  We will  plan on scheduling the surgery at the earliest convenience.  I did discuss with him that he may require some period of rehabilitation afterward, as nerves that have been through this process have actually been injured over a long period of time and the fact that his syndrome is acutely changing may not improve suddenly after we do the surgical decompression.  Concerns about the surgery include his underlying medical conditions, that is his shortness of breath with the lung cancer and his irregular heart rate, likely related to some atrial fibrillation.  Certainly, the fact that we are going through the coronavirus pandemic is of bother also, but given the fact that his level of function is deteriorating to this degree I believe that early decompression would be in his best interest at this time.  We will plan on scheduling the surgery at the earliest convenience.  There are also the acute surgical risks, which include the potential for instability at L4-5; and if this is found, he would need a stabilization procedure and the potential for spinal fluid leak, all of which has been discussed .

## 2018-07-25 NOTE — Telephone Encounter (Signed)
Left message for pt to call back about options for upcoming appt for a virtual visit.

## 2018-07-25 NOTE — Progress Notes (Signed)
Called on-call [employer] representative about order to have pacemaker interrogated. Representative stated they will be here tomorrow to complete order. Will continue to monitor.

## 2018-07-25 NOTE — Social Work (Signed)
CSW acknowledging consult for SNF placement. Will follow for therapy recommendations.   Letishia Elliott, MSW, LCSWA [facility] [phone]   

## 2018-07-25 NOTE — H&P (Signed)
[facility] Medical Consultation  MAJD TISSUE UQJ:[id number] DOB: 07/23/1945 DOA: 07/25/2018 PCP: Lajean Manes, MD   Requesting physician: Dr. Ellene Route Date of consultation: 07/25/2018 Reason for consultation: Medical management  Impression/Recommendations Active Problems:   Permanent atrial fibrillation   Complete heart block (HCC)   Cauda equina syndrome (HCC)   Bilateral lower extremity edema    1. Cauda equina syndrome: Status post lumbar laminectomy of L4 and L5 with decompression of L4-5 nerve roots. -Per neurosurgery   2. Lower extremity swelling: Patient noted to have a least 2+ pitting edema of the bilateral lower extremities.  He does not appear to be any acute respiratory distress.  Reported not to be responsive oral Lasix previously.  Last EF noted EF of 55 to 60% with no significant wall motion abnormalities in 04/2016.  Less likely DVT is patient had been on anticoagulation.  Exact cause of patient's lower extremity swelling not known at this time. -Elevate lower extremity -Place TED hose -Strict intake and output -Give furosemide 20 mg IV x1 dose now, reassess and determine if additional IV diuresis is warranted -Check BNP and TSH in a.m. -If concern for heart failure may warrant repeat echocardiogram  3. Complete heart block s/p pacemaker: Patient follows with Dr. Lovena Le of cardiology.  It appears his pacemaker was last interrogated in 08/2017.  Malfunction of the pacemaker could be possible cause for lower extremity swelling. -Have pacemaker interrogated  4. Atrial fibrillation on chronic anticoagulation: Patient previously on Eliquis for history of atrial fibrillation.  Heart rates appear rate controlled but are noted to be in the 50s. -Continue metoprolol as tolerated -Follow-up telemetry overnight  -Restart Eliquis when okayed by neurosurgery  5. History of lung cancer:: Patient with history of non-small cell lung cancer with multiple recurrences which  patient has been treated with lobectomy and radiation.  CT scan of the chestFrom 4/22 noted new sub-solid groundglass nodule in the anterior aspect of the right upper lobe. -Recommend continued outpatient follow-up and surveillance monitoring  6. Neuropathy: Patient complaining of numbness tingling in the lower extremities.  Likely secondary to nerve impingement.  He reports that he would like to see if symptoms resolve after surgery. -May benefit from being started on gabapentin if symptoms persist   I will follow- up again tomorrow. Please contact me if I can be of assistance in the meanwhile. Thank you for this consultation.  Chief Complaint: Lower extremity weakness  HPI:  Benjamin [name] is a 73 y.o. male with medical history significant of CAD s/p DES, non-small cell lung cancer with multiple recurrences s/p lobectomy and radiation, A. fib on Eliquis, complete heart block w/ PPM; who presented for lower extremity weakness.  In March of this year he had inguinal hernia repair on the right side by Dr. Brantley Stage.  Since that time he is noted that he has had increasing lower extremity swelling.  He has been having dyspnea on exertion, but relates it to his history of lung cancer that has been relatively unchanged.  Notes associated symptoms of decreased strength in his lower extremities, difficulty with balance, incomplete emptying of his bladder, change in bowel control, and and numbness tingling sensations in both legs.  He notes that his primary had tried furosemide to treat him with up to 120 mg without any change in lower extremity swelling.  Due to his other symptoms his primary had also ordered a CT scan of the lumbar spine on 4/22 to evaluate symptoms further and was found to have  grade 2 spondylo-lithiasis with stenosis at L4-5 causing nerve root impingement concerning for cauda equina syndrome.  Patient underwent lumbar laminectomy of L4 and L5 with decompression of L4 and L5 nerve roots.   Patient tolerated the procedure well.  [facility] was called to evaluate for patient's overall medical management.   Review of Systems  Constitutional: Negative for malaise/fatigue and weight loss.  Eyes: Negative for pain and discharge.  Respiratory: Positive for shortness of breath.   Cardiovascular: Positive for leg swelling. Negative for chest pain.  Musculoskeletal: Positive for joint pain.  Neurological: Positive for sensory change.    Past Medical History:  Diagnosis Date  . Adenopathy    RIGHT ADRENAL  . Anxiety   . Arthritis    KNEES/HANDS  . CAD (coronary artery disease)    a. Prior known CTO of LCX with recanalizationby cath 2008;  b. 09/2012 NSTEMI/Cath/PCI: LM nl, LAD 52m, D1 50p, LCX 100p (2.75x16 Promus Premier DES), 80-73m, OM1 30, RCA 28m (3.0x16 Promus Premier DES), EF 50-55%.  [name] History of echocardiogram    Echo 2/18: Moderate LVH, EF 55-60, normal wall motion, trivial AI, moderate LAE, mild RVE, mild RAE, mildly elevated pulmonary pressure  . Hx of melanoma of skin   . 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 Non-small cell lung cancer ([place])    a. s/p resection and XRT.  [name] PAF (paroxysmal atrial fibrillation) ([place])    a. not currently on anticoagulation (09/2012)  . Presence of permanent cardiac pacemaker   . 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 Uses walker    Past Surgical History:  Procedure Laterality Date  . ADRENALEXTOMY  02/2000  . CARDIAC CATHETERIZATION  2008   LAD 40, D1 OK, D2 50, CFX 90, RCA OK, EF 60%; Consider PCI PRN but the stenosis is quite complex and we would end up jailing several large posterolateral branches    . HERNIA REPAIR  05/27/2018  . INGUINAL HERNIA REPAIR Right 05/27/2018   Procedure: INCARCERATED RIGHT INGUINAL HERNIA REPAIR;  Surgeon: Erroll Luna, MD;  Location: [facility];  Service: General;  Laterality: Right;  . LEFT HEART CATHETERIZATION WITH CORONARY ANGIOGRAM N/A 10/17/2012   Procedure: LEFT HEART CATHETERIZATION  WITH CORONARY ANGIOGRAM;  Surgeon: Sherren Mocha, MD;  Location: [facility] CATH LAB;  Service: Cardiovascular;  Laterality: N/A;  . LOBECTOMY LEFT UPPER LOBE  08/15/2001  . PACEMAKER IMPLANT N/A 08/29/2016   Procedure: Pacemaker Implant;  Surgeon: Evans Lance, MD;  Location: [facility] CV LAB;  Service: Cardiovascular;  Laterality: N/A;  . Right VATS, mini thoracotomy, wedge resection of right upper  01/18/2004   Social History:  reports that he quit smoking about 16 years ago. His smoking use included cigarettes. He has a 40.00 pack-year smoking history. He has never used smokeless tobacco. He reports that he does not drink alcohol or use drugs.  Allergies  Allergen Reactions  . Codeine Other (See Comments)    Dizziness, stomach pain   Family History  Problem Relation Age of Onset  . Heart attack Father   . Cancer Mother        lung  . Cancer Cousin        breast    Prior to Admission medications   Medication Sig Start Date End Date Taking? Authorizing Provider  acetaminophen (TYLENOL) 325 MG tablet Take 2 tablets (650 mg total) by mouth every 6 (six) hours as needed for mild pain (or temp > 100). 05/28/18  Yes Rayburn,  Kelly A, PA-C  atorvastatin (LIPITOR) 20 MG tablet Take 20 mg by mouth daily.    Yes [provider]  clonazePAM (KLONOPIN) 1 MG tablet Take 1 mg by mouth 3 (three) times daily. 8am, 2pm, 8pm   Yes [provider]  ELIQUIS 5 MG TABS tablet TAKE 1 TABLET(5 MG) BY MOUTH TWICE DAILY Patient taking differently: Take 5 mg by mouth 2 (two) times daily.  05/20/18  Yes Evans Lance, MD  metoprolol succinate (TOPROL-XL) 25 MG 24 hr tablet TAKE 1 TABLET(25 MG) BY MOUTH DAILY Patient taking differently: Take 25 mg by mouth daily after breakfast.  01/15/18  Yes Weaver, Scott T, PA-C  Multiple Vitamins-Minerals (PRESERVISION AREDS 2) CAPS Take 1 capsule by mouth 2 (two) times daily.   Yes [provider]  nitroGLYCERIN (NITROSTAT) 0.4 MG SL tablet Place 1  tablet (0.4 mg total) under the tongue every 5 (five) minutes x 3 doses as needed for chest pain. 10/18/12   Theora Gianotti, NP  traMADol (ULTRAM) 50 MG tablet Take 1 tablet (50 mg total) by mouth every 6 (six) hours as needed for moderate pain or severe pain. Patient not taking: Reported on 07/19/2018 05/28/18 05/28/19  Rayburn, Floyce Stakes, PA-C   Physical Exam:  Constitutional: Elderly male in no NAD, calm, comfortable Vitals:   07/25/18 1546 07/25/18 1615 07/25/18 1654 07/25/18 2015  BP: 135/84  (!) 146/93 116/73  Pulse: 63  (!) 54 60  Resp: 18  16 18   Temp:  (!) 97.4 F (36.3 C) 97.6 F (36.4 C) (!) 97.4 F (36.3 C)  TempSrc:   Oral Oral  SpO2: 100%  100% 97%  Weight:      Height:       Eyes: PERRL, lids and conjunctivae normal ENMT: Mucous membranes are moist. Posterior pharynx clear of any exudate or lesions.  Neck: normal, supple, no masses, no thyromegaly Respiratory: clear to auscultation bilaterally, no wheezing, no crackles. Normal respiratory effort. No accessory muscle use.  Cardiovascular: Bradycardic, no murmurs / rubs / gallops.  2-3+ pitting bilateral lower extremity edema. 2+ pedal pulses. No carotid bruits.  Abdomen: no tenderness, no masses palpated. No hepatosplenomegaly. Bowel sounds positive.  Musculoskeletal: no clubbing / cyanosis. No joint deformity upper and lower extremities. Good ROM, no contractures. Normal muscle tone.  Skin: no rashes, lesions, ulcers. No induration Neurologic: CN 2-12 grossly intact. Sensation intact, DTR normal. Strength 5/5 in all 4.  Psychiatric: Normal judgment and insight. Alert and oriented x 3. Normal mood.   Labs on Admission:  Basic Metabolic Panel: Recent Labs  Lab 07/23/18 1126  NA 139  K 3.8  CL 103  CO2 28  GLUCOSE 166*  BUN 17  CREATININE 1.05  CALCIUM 8.7*   Liver Function Tests: No results for input(s): AST, ALT, ALKPHOS, BILITOT, PROT, ALBUMIN in the last 168 hours. No results for input(s): LIPASE,  AMYLASE in the last 168 hours. No results for input(s): AMMONIA in the last 168 hours. CBC: Recent Labs  Lab 07/23/18 1126  WBC 6.7  HGB 15.1  HCT 45.6  MCV 91.0  PLT 161   Cardiac Enzymes: No results for input(s): CKTOTAL, CKMB, CKMBINDEX, TROPONINI in the last 168 hours. BNP: Invalid input(s): POCBNP CBG: No results for input(s): GLUCAP in the last 168 hours.  Radiological Exams on Admission: Dg Lumbar Spine 1 View  Result Date: 07/25/2018 CLINICAL DATA:  Posterior laminectomy EXAM: LUMBAR SPINE - 1 VIEW COMPARISON:  Spine CT 07/10/2018 FINDINGS: Single lateral intraoperative  view of lumbar spine. Skin spreaders posterior to the L5 vertebral body. IMPRESSION: Intraoperative view as above. Electronically Signed   By: Suzy Bouchard M.D.   On: 07/25/2018 13:56      Time spent: >45 minutes  [facility] Pager [phone]  If 7PM-7AM, please contact night-coverage [email or web address] Password [email or web address] 07/25/2018, 8:25 PM

## 2018-07-25 NOTE — Anesthesia Postprocedure Evaluation (Signed)
Anesthesia Post Note  Patient: Benjamin [name]  Procedure(s) Performed: Lumbar Four-Five Laminectomy (N/A Back)     Patient location during evaluation: PACU Anesthesia Type: General Level of consciousness: awake and alert Pain management: pain level controlled Vital Signs Assessment: post-procedure vital signs reviewed and stable Respiratory status: spontaneous breathing, nonlabored ventilation, respiratory function stable and patient connected to nasal cannula oxygen Cardiovascular status: blood pressure returned to baseline and stable Postop Assessment: no apparent nausea or vomiting Anesthetic complications: no    Last Vitals:  Vitals:   07/25/18 1546 07/25/18 1615  BP: 135/84   Pulse: 63   Resp: 18   Temp:  (!) 36.3 C  SpO2: 100%     Last Pain:  Vitals:   07/25/18 1615  TempSrc:   PainSc: 3                  Chelsey L Woodrum

## 2018-07-25 NOTE — Anesthesia Procedure Notes (Signed)
Procedure Name: Intubation Date/Time: 07/25/2018 12:12 PM Performed by: Renato Shin, CRNA Pre-anesthesia Checklist: Patient identified, Emergency Drugs available, Suction available and Patient being monitored Patient Re-evaluated:Patient Re-evaluated prior to induction Oxygen Delivery Method: Circle system utilized Preoxygenation: Pre-oxygenation with 100% oxygen Induction Type: IV induction and Rapid sequence Laryngoscope Size: Miller and 2 Grade View: Grade I Tube type: Oral Tube size: 7.5 mm Number of attempts: 1 Airway Equipment and Method: Stylet Placement Confirmation: ETT inserted through vocal cords under direct vision,  breath sounds checked- equal and bilateral and positive ETCO2 Secured at: 22 cm Tube secured with: Tape Dental Injury: Teeth and Oropharynx as per pre-operative assessment

## 2018-07-25 NOTE — Transfer of Care (Signed)
Immediate Anesthesia Transfer of Care Note  Patient: Benjamin [name]  Procedure(s) Performed: Lumbar Four-Five Laminectomy (N/A Back)  Patient Location: PACU  Anesthesia Type:General  Level of Consciousness: awake, drowsy and patient cooperative  Airway & Oxygen Therapy: Patient Spontanous Breathing and Patient connected to face mask oxygen  Post-op Assessment: Report given to RN and Post -op Vital signs reviewed and stable  Post vital signs: Reviewed and stable  Last Vitals:  Vitals Value Taken Time  BP 118/87 07/25/2018  2:17 PM  Temp    Pulse 78 07/25/2018  2:19 PM  Resp 12 07/25/2018  2:19 PM  SpO2 99 % 07/25/2018  2:19 PM  Vitals shown include unvalidated device data.  Last Pain:  Vitals:   07/25/18 1057  TempSrc:   PainSc: 6       Patients Stated Pain Goal: 3 (01/65/53 7482)  Complications: No apparent anesthesia complications

## 2018-07-26 ENCOUNTER — Inpatient Hospital Stay (HOSPITAL_COMMUNITY): Payer: Medicare Other

## 2018-07-26 ENCOUNTER — Encounter (HOSPITAL_COMMUNITY): Payer: Self-pay | Admitting: Neurological Surgery

## 2018-07-26 DIAGNOSIS — I442 Atrioventricular block, complete: Secondary | ICD-10-CM

## 2018-07-26 LAB — BASIC METABOLIC PANEL
Anion gap: 13 (ref 5–15)
BUN: 16 mg/dL (ref 8–23)
CO2: 23 mmol/L (ref 22–32)
Calcium: 8.6 mg/dL — ABNORMAL LOW (ref 8.9–10.3)
Chloride: 102 mmol/L (ref 98–111)
Creatinine, Ser: 0.93 mg/dL (ref 0.61–1.24)
GFR calc Af Amer: >60 mL/min (ref >60)
GFR calc non Af Amer: >60 mL/min (ref >60)
Glucose, Bld: 143 mg/dL — ABNORMAL HIGH (ref 70–99)
Potassium: 3.9 mmol/L (ref 3.5–5.1)
Sodium: 138 mmol/L (ref 135–145)

## 2018-07-26 LAB — CBC
HCT: 41.8 % (ref 39.0–52.0)
Hemoglobin: 13.9 g/dL (ref 13.0–17.0)
MCH: 29.8 pg (ref 26.0–34.0)
MCHC: 33.3 g/dL (ref 30.0–36.0)
MCV: 89.5 fL (ref 80.0–100.0)
Platelets: 139 10*3/uL — ABNORMAL LOW (ref 150–400)
RBC: 4.67 MIL/uL (ref 4.22–5.81)
RDW: 13.1 % (ref 11.5–15.5)
WBC: 13.8 10*3/uL — ABNORMAL HIGH (ref 4.0–10.5)
nRBC: 0 % (ref 0.0–0.2)

## 2018-07-26 LAB — BRAIN NATRIURETIC PEPTIDE: B Natriuretic Peptide: 188.3 pg/mL — ABNORMAL HIGH (ref 0.0–100.0)

## 2018-07-26 LAB — ECHOCARDIOGRAM COMPLETE
Height: 71 in
Weight: 3188.73 oz

## 2018-07-26 LAB — TSH: TSH: 0.408 u[IU]/mL (ref 0.350–4.500)

## 2018-07-26 MED ORDER — FUROSEMIDE 10 MG/ML IJ SOLN
40.0000 mg | Freq: Every day | INTRAMUSCULAR | Status: DC
  Administered 2018-07-26 – 2018-07-31 (×6): 40 mg via INTRAVENOUS
  Filled 2018-07-26 (×6): qty 4

## 2018-07-26 NOTE — Progress Notes (Signed)
  Echocardiogram 2D Echocardiogram has been performed.  Jennette Dubin 07/26/2018, 11:57 AM

## 2018-07-26 NOTE — Progress Notes (Signed)
PROGRESS NOTE    Benjamin [name]  NKN:[id number] DOB: [birth year] DOA: 07/25/2018 PCP: Lajean Manes, MD    Brief Narrative:  73 y.o. male with medical history significant of CAD s/p DES, non-small cell lung cancer with multiple recurrences s/p lobectomy and radiation, A. fib on Eliquis, complete heart block w/ PPM; who presented for lower extremity weakness.  In March of this year he had inguinal hernia repair on the right side by Dr. Brantley Stage.  Since that time he is noted that he has had increasing lower extremity swelling.  He has been having dyspnea on exertion, but relates it to his history of lung cancer that has been relatively unchanged.  Notes associated symptoms of decreased strength in his lower extremities, difficulty with balance, incomplete emptying of his bladder, change in bowel control, and and numbness tingling sensations in both legs.  He notes that his primary had tried furosemide to treat him with up to 120 mg without any change in lower extremity swelling.  Due to his other symptoms his primary had also ordered a CT scan of the lumbar spine on 4/22 to evaluate symptoms further and was found to have grade 2 spondylo-lithiasis with stenosis at L4-5 causing nerve root impingement concerning for cauda equina syndrome.  Patient underwent lumbar laminectomy of L4 and L5 with decompression of L4 and L5 nerve roots.  Patient tolerated the procedure well.  TRH was called to evaluate for patient's overall medical management.  Assessment & Plan:   Principal Problem:   Cauda equina syndrome (HCC) Active Problems:   Permanent atrial fibrillation   Complete heart block (HCC)   Bilateral lower extremity edema   1. Cauda equina syndrome: Status post lumbar laminectomy of L4 and L5 with decompression of L4-5 nerve roots. -Continuing plan per Neurosurgery -plans for rehab in process  2. Lower extremity swelling likely secondary to acute diastolic CHF, chronicity is unknown 1. BNP noted to  be elevated at 188.3 2. 2d echo reviewed, normal LVEF 3. Pt has hx of CAD s/p stent in 2014. 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 Pt reports worsening swelling over the past 1-2 months. Cardiology clinic notes reviewed. Pt noted to have trace BLE edema at the ankles at that time 5. Given IV lasix overnight with very good improvement 6. Will continue daily IV lasix as tolerated 7. Repeat BMET in AM 8. Would check daily weights 9. When discharged, recommend follow up with primary cardiologist, Dr. Acie Fredrickson  3. Complete heart block s/p pacemaker: Patient follows with Dr. Lovena Le of cardiology.  It appears his pacemaker was last interrogated in 08/2017.  -Pacemaker interrogated, will review  4. Atrial fibrillation on chronic anticoagulation: Patient previously on Eliquis for history of atrial fibrillation.  Heart rates appear rate controlled but are noted to be in the 50s. -Continue metoprolol as tolerated -Recommend to restart Eliquis when okayed by neurosurgery  5. History of lung cancer:: Patient with history of non-small cell lung cancer with multiple recurrences which patient has been treated with lobectomy and radiation.  CT scan of the chestFrom 4/22 noted new sub-solid groundglass nodule in the anterior aspect of the right upper lobe. -Recommend continued outpatient work up  6. Neuropathy: Patient complaining of numbness tingling in the lower extremities.  Likely secondary to nerve impingement.  He reports that he would like to see if symptoms resolve after surgery. -Consider gabapentin if symptoms persist  DVT prophylaxis: SCD's Code Status: Full Family Communication: Pt in room, family not at bedside  Disposition Plan: Rehab being considered  Consultants:     Procedures:     Antimicrobials: Anti-infectives (From admission, onward)   Start     Dose/Rate Route Frequency Ordered Stop   07/25/18 2000  ceFAZolin (ANCEF) IVPB 2g/100 mL premix     2  g 200 mL/hr over 30 Minutes Intravenous Every 8 hours 07/25/18 1655 07/26/18 0355   07/25/18 1246  bacitracin 50,000 Units in sodium chloride 0.9 % 500 mL irrigation  Status:  Discontinued       As needed 07/25/18 1247 07/25/18 1411   07/25/18 1045  ceFAZolin (ANCEF) IVPB 2g/100 mL premix     2 g 200 mL/hr over 30 Minutes Intravenous On call to O.R. 07/25/18 1033 07/25/18 1244   07/25/18 1041  ceFAZolin (ANCEF) 2-4 GM/100ML-% IVPB    Note to Pharmacy:  Marga Melnick   : cabinet override      07/25/18 1041 07/25/18 1214       Subjective: Reports LE swelling is much improved  Objective: Vitals:   07/26/18 0500 07/26/18 0747 07/26/18 0756 07/26/18 1120  BP:  117/60  114/68  Pulse:  66  60  Resp:  16  16  Temp:   97.8 F (36.6 C) 98.1 F (36.7 C)  TempSrc:   Oral Oral  SpO2:  95%  96%  Weight: 90.4 kg     Height:        Intake/Output Summary (Last 24 hours) at 07/26/2018 1528 Last data filed at 07/26/2018 1300 Gross per 24 hour  Intake 730 ml  Output 2450 ml  Net -1720 ml   Filed Weights   07/25/18 1057 07/26/18 0500  Weight: 90.7 kg 90.4 kg    Examination:  General exam: Appears calm and comfortable  Respiratory system: Clear to auscultation. Respiratory effort normal. Cardiovascular system: S1 & S2 heard, RRR.  Gastrointestinal system: Abdomen is nondistended, soft and nontender. No organomegaly or masses felt. Normal bowel sounds heard. Central nervous system: Alert and oriented. No focal neurological deficits. Extremities: Symmetric 5 x 5 power, 2+pitting LE edema much improved, per pt Skin: No rashes, lesions Psychiatry: Judgement and insight appear normal. Mood & affect appropriate.   Data Reviewed: I have personally reviewed following labs and imaging studies  CBC: Recent Labs  Lab 07/23/18 1126 07/26/18 0218  WBC 6.7 13.8*  HGB 15.1 13.9  HCT 45.6 41.8  MCV 91.0 89.5  PLT 161 024*   Basic Metabolic Panel: Recent Labs  Lab 07/23/18 1126 07/26/18  0218  NA 139 138  K 3.8 3.9  CL 103 102  CO2 28 23  GLUCOSE 166* 143*  BUN 17 16  CREATININE 1.05 0.93  CALCIUM 8.7* 8.6*   GFR: Estimated Creatinine Clearance: 78.9 mL/min (by C-G formula based on SCr of 0.93 mg/dL). Liver Function Tests: No results for input(s): AST, ALT, ALKPHOS, BILITOT, PROT, ALBUMIN in the last 168 hours. No results for input(s): LIPASE, AMYLASE in the last 168 hours. No results for input(s): AMMONIA in the last 168 hours. Coagulation Profile: Recent Labs  Lab 07/23/18 1126  INR 1.2   Cardiac Enzymes: No results for input(s): CKTOTAL, CKMB, CKMBINDEX, TROPONINI in the last 168 hours. BNP (last 3 results) No results for input(s): PROBNP in the last 8760 hours. HbA1C: No results for input(s): HGBA1C in the last 72 hours. CBG: No results for input(s): GLUCAP in the last 168 hours. Lipid Profile: No results for input(s): CHOL, HDL, LDLCALC, TRIG, CHOLHDL, LDLDIRECT in the last 72 hours. Thyroid Function  Tests: Recent Labs    07/26/18 0218  TSH 0.408   Anemia Panel: No results for input(s): VITAMINB12, FOLATE, FERRITIN, TIBC, IRON, RETICCTPCT in the last 72 hours. Sepsis Labs: No results for input(s): PROCALCITON, LATICACIDVEN in the last 168 hours.  Recent Results (from the past 240 hour(s))  Surgical pcr screen     Status: None   Collection Time: 07/23/18 11:26 AM  Result Value Ref Range Status   MRSA, PCR NEGATIVE NEGATIVE Final   Staphylococcus aureus NEGATIVE NEGATIVE Final    Comment: (NOTE) The Xpert SA Assay (FDA approved for NASAL specimens in patients 71 years of age and older), is one component of a comprehensive surveillance program. It is not intended to diagnose infection nor to guide or monitor treatment. Performed at [facility], [place] [address] [place], [place] [address]      Radiology Studies: Dg Lumbar Spine 1 View  Result Date: 07/25/2018 CLINICAL DATA:  Posterior laminectomy EXAM: LUMBAR SPINE - 1 VIEW  COMPARISON:  Spine CT 07/10/2018 FINDINGS: Single lateral intraoperative view of lumbar spine. Skin spreaders posterior to the L5 vertebral body. IMPRESSION: Intraoperative view as above. Electronically Signed   By: Suzy Bouchard M.D.   On: 07/25/2018 13:56    Scheduled Meds: . atorvastatin  20 mg Oral Daily  . clonazePAM  1 mg Oral TID  . docusate sodium  100 mg Oral BID  . furosemide  40 mg Intravenous Daily  . metoprolol succinate  25 mg Oral QPC breakfast  . senna  1 tablet Oral BID  . sodium chloride flush  3 mL Intravenous Q12H   Continuous Infusions: . sodium chloride    . methocarbamol (ROBAXIN) IV       LOS: 1 day   Marylu Lund, MD [facility] Pager On Amion  If 7PM-7AM, please contact night-coverage 07/26/2018, 3:28 PM

## 2018-07-26 NOTE — Progress Notes (Signed)
Patient ID: Benjamin [name], male   DOB: 1945/10/26, 73 y.o.   MRN: [id number] Vital signs are stable Motor function demonstrates 4 out of 5 strength to confrontation in the iliopsoas and quads 3 out of 5 in the tibialis anterior and gastrocs Incision is clean and dry Patient notes modest back pain He notes that his leg swelling is considerably better after IV Lasix Appreciate the help of the hospitalist I will ask rehabilitation medicine to see patient for evaluation for comprehensive inpatient rehabilitation.

## 2018-07-26 NOTE — Evaluation (Addendum)
Physical Therapy Evaluation Patient Details Name: Benjamin [name] MRN: [id number] DOB: 1946-01-20 Today's Date: 07/26/2018   History of Present Illness  Patient is a 73 y/o male who presents with BLE weakness, balance difficulties and difficulty walking concerning for cauda equina syndrome. Now s/p L4-5 laminectomy. PMH includes lung ca, pacemaker, PAF, CAD.   Clinical Impression  Patient presents with pain, generalized weakness, impaired sensation, dyspnea on exertion and impaired mobility s/p above. Pt independent PTA and lives alone using RW for mobility. Reports multiple falls in last few weeks due to legs feeling like spaghetti. Today, pt requires Min-Mod A for transfers and gait training with multiple standing rest breaks due to fatigue and DOE. Noted to have right ankle instability, rolling at times during ambulation and reports having leg length discrepancy (request pt have shoes and insert). Pt not safe to return home alone. Education re: back precautions, importance of mobility. Would benefit from SNF to maximize independence and mobility prior to return home.    Follow Up Recommendations SNF;Supervision for mobility/OOB;Supervision/Assistance - 24 hour    Equipment Recommendations  None recommended by PT    Recommendations for Other Services       Precautions / Restrictions Precautions Precautions: Back Precaution Booklet Issued: No Precaution Comments: Reviewed back precautions Restrictions Weight Bearing Restrictions: No      Mobility  Bed Mobility               General bed mobility comments: Sitting in chair upon PT arrival.   Transfers Overall transfer level: Needs assistance Equipment used: Rolling walker (2 wheeled) Transfers: Sit to/from Stand Sit to Stand: Mod assist         General transfer comment: assist to power to standing with cues for hand placement.   Ambulation/Gait Ambulation/Gait assistance: Min assist Gait Distance (Feet): 100  Feet Assistive device: Rolling walker (2 wheeled) Gait Pattern/deviations: Step-through pattern;Trunk flexed;Decreased dorsiflexion - right     General Gait Details: Slow, unsteady gait with decreased foot clearance and step length LLE; Right ankle instability noted during stance phase. Hx of leg length discrepancy. RLE. 6 standing rest breaks, cues for RW management and proximity. 2-3/4 DOE. Sp02 93% on RA. Pt leaning on walker handles.  Stairs            Wheelchair Mobility    Modified Rankin (Stroke Patients Only)       Balance Overall balance assessment: Needs assistance;History of Falls Sitting-balance support: Feet supported;No upper extremity supported Sitting balance-Leahy Scale: Fair     Standing balance support: During functional activity;Bilateral upper extremity supported Standing balance-Leahy Scale: Poor Standing balance comment: Requires BUE support in standing.                              Pertinent Vitals/Pain Pain Assessment: Faces Faces Pain Scale: Hurts a little bit Pain Location: back Pain Descriptors / Indicators: Operative site guarding;Sore Pain Intervention(s): Monitored during session;Repositioned    Home Living Family/patient expects to be discharged to:: Private residence Living Arrangements: Alone Available Help at Discharge: Family;Available PRN/intermittently Type of Home: Apartment Home Access: Level entry     Home Layout: One level Home Equipment: Walker - 2 wheels      Prior Function Level of Independence: Independent with assistive device(s)         Comments: Uses RW for ambulation. Able to perform ADLs. Multiple falls at home last few weeks, legs feel like spaghetti     Hand  Dominance        Extremity/Trunk Assessment   Upper Extremity Assessment Upper Extremity Assessment: Defer to OT evaluation    Lower Extremity Assessment Lower Extremity Assessment: RLE deficits/detail;LLE deficits/detail RLE  Deficits / Details: Swelling present; decreased ankle AROM RLE Sensation: decreased light touch;decreased proprioception LLE Sensation: decreased light touch;decreased proprioception       Communication   Communication: HOH  Cognition Arousal/Alertness: Awake/alert Behavior During Therapy: WFL for tasks assessed/performed Overall Cognitive Status: Within Functional Limits for tasks assessed                                 General Comments: for basic mobility tasks; repeats stories. Question some safety awareness as pt wanting to go home.      General Comments General comments (skin integrity, edema, etc.): 2-3/4 DOE throughout with mutiple standing rest breaks needed.     Exercises     Assessment/Plan    PT Assessment Patient needs continued PT services  PT Problem List Decreased strength;Decreased balance;Decreased knowledge of precautions;Pain;Cardiopulmonary status limiting activity;Decreased mobility;Decreased knowledge of use of DME;Decreased range of motion;Decreased activity tolerance;Impaired sensation;Decreased skin integrity;Decreased safety awareness       PT Treatment Interventions Functional mobility training;Balance training;Patient/family education;Therapeutic activities;Gait training;Therapeutic exercise;DME instruction;Neuromuscular re-education;Stair training    PT Goals (Current goals can be found in the Care Plan section)  Acute Rehab PT Goals Patient Stated Goal: to get better and be independent at home PT Goal Formulation: With patient Time For Goal Achievement: 08/09/18 Potential to Achieve Goals: Good    Frequency Min 5X/week   Barriers to discharge Decreased caregiver support lives alone    Co-evaluation               AM-PAC PT "6 Clicks" Mobility  Outcome Measure Help needed turning from your back to your side while in a flat bed without using bedrails?: A Little Help needed moving from lying on your back to sitting on the  side of a flat bed without using bedrails?: A Little Help needed moving to and from a bed to a chair (including a wheelchair)?: A Lot Help needed standing up from a chair using your arms (e.g., wheelchair or bedside chair)?: A Lot Help needed to walk in hospital room?: A Little Help needed climbing 3-5 steps with a railing? : A Lot 6 Click Score: 15    End of Session Equipment Utilized During Treatment: Gait belt Activity Tolerance: Patient tolerated treatment well Patient left: in chair;with call bell/phone within reach;with chair alarm set Nurse Communication: Mobility status PT Visit Diagnosis: Unsteadiness on feet (R26.81);Muscle weakness (generalized) (M62.81);Difficulty in walking, not elsewhere classified (R26.2);History of falling (Z91.81);Pain Pain - part of body: (back)    Time: 1448-1856 PT Time Calculation (min) (ACUTE ONLY): 29 min   Charges:   PT Evaluation $PT Eval Moderate Complexity: 1 Mod PT Treatments $Gait Training: 8-22 mins        Wray Kearns, PT, DPT Acute Rehabilitation Services Pager [phone] Office [place] 07/26/2018, 12:25 PM

## 2018-07-27 LAB — BASIC METABOLIC PANEL
Anion gap: 10 (ref 5–15)
BUN: 23 mg/dL (ref 8–23)
CO2: 29 mmol/L (ref 22–32)
Calcium: 8.4 mg/dL — ABNORMAL LOW (ref 8.9–10.3)
Chloride: 101 mmol/L (ref 98–111)
Creatinine, Ser: 1.09 mg/dL (ref 0.61–1.24)
GFR calc Af Amer: >60 mL/min (ref >60)
GFR calc non Af Amer: >60 mL/min (ref >60)
Glucose, Bld: 95 mg/dL (ref 70–99)
Potassium: 4 mmol/L (ref 3.5–5.1)
Sodium: 140 mmol/L (ref 135–145)

## 2018-07-27 MED ORDER — APIXABAN 5 MG PO TABS
5.0000 mg | ORAL_TABLET | Freq: Two times a day (BID) | ORAL | Status: DC
  Administered 2018-07-27 – 2018-07-31 (×10): 5 mg via ORAL
  Filled 2018-07-27 (×10): qty 1

## 2018-07-27 MED ORDER — TAMSULOSIN HCL 0.4 MG PO CAPS
0.4000 mg | ORAL_CAPSULE | Freq: Every day | ORAL | Status: DC
  Administered 2018-07-27 – 2018-07-31 (×5): 0.4 mg via ORAL
  Filled 2018-07-27 (×5): qty 1

## 2018-07-27 MED ORDER — MAGNESIUM HYDROXIDE 400 MG/5ML PO SUSP: 30.0000 mL | Freq: Every day | ORAL | Status: DC | PRN

## 2018-07-27 NOTE — Progress Notes (Signed)
Physical Therapy Treatment Patient Details Name: Benjamin [name] MRN: [id number] DOB: May 27, 1945 Today's Date: 07/27/2018    History of Present Illness Patient is a 73 y/o male who presents with BLE weakness, balance difficulties and difficulty walking concerning for cauda equina syndrome. Now s/p L4-5 laminectomy. PMH includes lung ca, pacemaker, PAF, CAD.     PT Comments    Pt maintaining functional mobility. Ambulating x 75 feet with walker and min assist. Continues with decreased lower extremity sensation, weakness, increased peripheral edema, balance impairments, and poor endurance. Continue to recommend SNF for ongoing Physical Therapy.     Follow Up Recommendations  SNF;Supervision for mobility/OOB;Supervision/Assistance - 24 hour     Equipment Recommendations  None recommended by PT    Recommendations for Other Services       Precautions / Restrictions Precautions Precautions: Back;Fall Precaution Booklet Issued: Yes (comment) Restrictions Weight Bearing Restrictions: No    Mobility  Bed Mobility Overal bed mobility: Needs Assistance Bed Mobility: Sit to Sidelying         Sit to sidelying: Mod assist General bed mobility comments: ModA for BLE negotiation back into bed  Transfers Overall transfer level: Needs assistance Equipment used: Rolling walker (2 wheeled) Transfers: Sit to/from Stand Sit to Stand: Min assist         General transfer comment: Min assist to power up to stand  Ambulation/Gait Ambulation/Gait assistance: Min assist Gait Distance (Feet): 75 Feet Assistive device: Rolling walker (2 wheeled) Gait Pattern/deviations: Step-through pattern;Trunk flexed;Decreased dorsiflexion - right Gait velocity: decr Gait velocity interpretation: <1.8 ft/sec, indicate of risk for recurrent falls General Gait Details: Cues for upright posture and walker proximity. Continues with right ankle instability during mid stance. 2 standing rest breaks, leaning  forearms on walker towards end of ambulation due to fatigue.    Stairs             Wheelchair Mobility    Modified Rankin (Stroke Patients Only)       Balance Overall balance assessment: Needs assistance;History of Falls Sitting-balance support: Feet supported;No upper extremity supported Sitting balance-Leahy Scale: Fair     Standing balance support: During functional activity;Bilateral upper extremity supported Standing balance-Leahy Scale: Poor                              Cognition Arousal/Alertness: Awake/alert Behavior During Therapy: WFL for tasks assessed/performed Overall Cognitive Status: Within Functional Limits for tasks assessed                                        Exercises      General Comments        Pertinent Vitals/Pain Pain Assessment: Faces Faces Pain Scale: Hurts even more Pain Location: back Pain Descriptors / Indicators: Operative site guarding;Sore Pain Intervention(s): Limited activity within patient's tolerance;Monitored during session    Home Living                      Prior Function            PT Goals (current goals can now be found in the care plan section) Acute Rehab PT Goals Patient Stated Goal: to get better and be independent at home Potential to Achieve Goals: Good Progress towards PT goals: Progressing toward goals    Frequency    Min 5X/week  PT Plan Current plan remains appropriate    Co-evaluation              AM-PAC PT "6 Clicks" Mobility   Outcome Measure  Help needed turning from your back to your side while in a flat bed without using bedrails?: A Lot Help needed moving from lying on your back to sitting on the side of a flat bed without using bedrails?: A Little Help needed moving to and from a bed to a chair (including a wheelchair)?: A Little Help needed standing up from a chair using your arms (e.g., wheelchair or bedside chair)?: A  Little Help needed to walk in hospital room?: A Little Help needed climbing 3-5 steps with a railing? : A Lot 6 Click Score: 16    End of Session Equipment Utilized During Treatment: Gait belt Activity Tolerance: Patient tolerated treatment well Patient left: with call bell/phone within reach;in bed Nurse Communication: Mobility status PT Visit Diagnosis: Unsteadiness on feet (R26.81);Muscle weakness (generalized) (M62.81);Difficulty in walking, not elsewhere classified (R26.2);History of falling (Z91.81);Pain     Time: 7711-6579 PT Time Calculation (min) (ACUTE ONLY): 18 min  Charges:  $Gait Training: 8-22 mins                    Ellamae Sia, [place], DPT Acute Rehabilitation Services Pager [phone] Office [phone]    Willy Eddy 07/27/2018, 10:41 AM

## 2018-07-27 NOTE — TOC Initial Note (Signed)
Transition of Care [facility]) - Initial/Assessment Note    Patient Details  Name: Benjamin [name] MRN: [id number] Date of Birth: May 24, 1945  Transition of Care [facility]) CM/SW Contact:    Oretha Milch, LCSW Phone Number: 07/27/2018, 10:56 AM  Clinical Narrative: CSW met with patient and called patient's brother to gather collateral to assess patient's social environment. CSW noted patient reported he understood PT/OT recommendation to SNF and was apprehensive but open to it. CSW noted he had concerns regarding his insurance coverage, location, and COVID concerns. CSW informed him of Medicare coverage for SNF and provided additional information for the referral process. CSW received consent to call brother and complete initial paperwork but wanted to speak with CSW in PM regarding being referred out. CSW noted patient's preference for [facility] and openness to [facility] facilities. CSW spoke with brother and gathered information regarding patient living alone and him not being physically able to provide care for his brother. CSW was informed by brother that he spoke with the patient and is encouraging him to go to a SNF.                Expected Discharge Plan: Skilled Nursing Facility Barriers to Discharge: [place] (PASRR), Insurance Authorization, Unsafe home situation   Patient Goals and CMS Choice Patient states their goals for this hospitalization and ongoing recovery are:: "I want to be able to get home, do what I need to get there." CMS Medicare.gov Compare Post Acute Care list provided to:: Patient Choice offered to / list presented to : Patient  Expected Discharge Plan and Services Expected Discharge Plan: [place] Choice: [place] arrangements for the past 2 months: Single Family Home                                      Prior Living Arrangements/Services Living arrangements for the past 2  months: Single Family Home Lives with:: Self Patient language and need for interpreter reviewed:: No Do you feel safe going back to the place where you live?: Yes      Need for Family Participation in Patient Care: No (Comment) Care giver support system in place?: No (comment)   Criminal Activity/Legal Involvement Pertinent to Current Situation/Hospitalization: No - Comment as needed  Activities of Daily Living Home Assistive Devices/Equipment: Eyeglasses, Gilford Rile (specify type) ADL Screening (condition at time of admission) Patient's cognitive ability adequate to safely complete daily activities?: Yes Is the patient deaf or have difficulty hearing?: No Does the patient have difficulty seeing, even when wearing glasses/contacts?: No Does the patient have difficulty concentrating, remembering, or making decisions?: No Patient able to express need for assistance with ADLs?: Yes Does the patient have difficulty dressing or bathing?: No Independently performs ADLs?: Yes (appropriate for developmental age) Does the patient have difficulty walking or climbing stairs?: Yes Weakness of Legs: None Weakness of Arms/Hands: None  Permission Sought/Granted Permission sought to share information with : Family Supports Permission granted to share information with : Yes, Verbal Permission Granted  Share Information with NAME: Ezariah Nace  Permission granted to share info w AGENCY: Not provided at this time  Permission granted to share info w Relationship: Brother  Permission granted to share info w Contact Information: [phone]  Emotional Assessment Appearance:: Appears older than stated age, Developmentally appropriate Attitude/Demeanor/Rapport: Engaged, Self-Confident Affect (typically observed): Accepting, Adaptable,  Appropriate Orientation: : Oriented to Self, Oriented to Place, Oriented to Situation, Oriented to  Time Alcohol / Substance Use: Not Applicable Psych Involvement: No  (comment)  Admission diagnosis:  Cauda Equina syndrome Patient Active Problem List   Diagnosis Date Noted  . Cauda equina syndrome ([place]) 07/25/2018  . Bilateral lower extremity edema 07/25/2018  . Incarcerated inguinal hernia 05/24/2018  . Complete heart block ([place]) 08/29/2016  . 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 Pneumothorax after biopsy 08/24/2015  . Hyperlipidemia 09/14/2014  . Mobitz type 1 second degree atrioventricular block 10/17/2012  . Hx of NSTEMI tx with DES to RCA and DES to LCx in 2014 10/16/2012  . Chest pain, atypical 07/02/2012  . Malignant neoplasm of hilus of lung ([place])   . Adenopathy   . Anxiety   . Arthritis   . Hx of melanoma of skin   . Permanent atrial fibrillation   . CAD (coronary artery disease)    PCP:  Lajean Manes, MD Pharmacy:   [place], [place] - [address] [place] [address] Phone: [phone] Fax: [phone]  [facility] [place], [place] [address] [address] AT [facility] [address] [place] [address] Phone: [phone] Fax: [phone]     Social Determinants of Health (SDOH) Interventions    Readmission Risk Interventions No flowsheet data found.

## 2018-07-27 NOTE — Progress Notes (Signed)
ANTICOAGULATION CONSULT NOTE - Initial Consult  Pharmacy Consult for Apixaban Indication: atrial fibrillation  Allergies  Allergen Reactions  . Codeine Other (See Comments)    Dizziness, stomach pain   Patient Measurements: Height: 5\' 11"  (180.3 cm) Weight: 199 lb 4.7 oz (90.4 kg) IBW/kg (Calculated) : 75.3 Vital Signs: Temp: 97.5 F (36.4 C) (05/09 0817) Temp Source: Oral (05/09 0817) BP: 131/73 (05/09 0424) Pulse Rate: 63 (05/09 0424) Labs: Recent Labs    07/26/18 0218 07/27/18 0304  HGB 13.9  --   HCT 41.8  --   PLT 139*  --   CREATININE 0.93 1.09    Estimated Creatinine Clearance: 67.3 mL/min (by C-G formula based on SCr of 1.09 mg/dL).   Medical History: Past Medical History:  Diagnosis Date  . Adenopathy    RIGHT ADRENAL  . Anxiety   . Arthritis    KNEES/HANDS  . CAD (coronary artery disease)    a. Prior known CTO of LCX with recanalizationby cath 2008;  b. 09/2012 NSTEMI/Cath/PCI: LM nl, LAD 73m, D1 50p, LCX 100p (2.75x16 Promus Premier DES), 80-15m, OM1 30, RCA 40m (3.0x16 Promus Premier DES), EF 50-55%.  [name] History of echocardiogram    Echo 2/18: Moderate LVH, EF 55-60, normal wall motion, trivial AI, moderate LAE, mild RVE, mild RAE, mildly elevated pulmonary pressure  . Hx of melanoma of skin   . 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 Non-small cell lung cancer ([place])    a. s/p resection and XRT.  [name] PAF (paroxysmal atrial fibrillation) ([place])    a. not currently on anticoagulation (09/2012)  . Presence of permanent cardiac pacemaker   . 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 Uses walker     Assessment: 73 year old male admitted 5/7 for spinal surgery which was performed that day. Patient has a history of atrial fibrillation on Apixaban prior to admission. Apixaban was held pre-operatively x3 days (last dose prior to admission on 07/21/18). Pharmacy has been consulted by Neurosurgery to resume Apixaban post-operatively day #2. CBC on  post-operative day #1 was within normal limits. SCr is stable. No bleeding noted.   Goal of Therapy:  Monitor platelets by anticoagulation protocol: Yes   Plan:  Resume Apixaban 5mg  po BID Monitor CBC, renal function, and for any signs or symptoms of bleeding.   Sloan Leiter, PharmD, BCPS, BCCCP Clinical Pharmacist Please refer to [facility] for [place] numbers 07/27/2018,9:55 AM

## 2018-07-27 NOTE — NC FL2 (Signed)
[place] LEVEL OF CARE SCREENING TOOL     IDENTIFICATION  Patient Name: Benjamin [name] Birthdate: 07-28-1945 Sex: male Admission Date (Current Location): 07/25/2018  [facility] and [place] Number:  Herbalist and Address:  [facility], [place] [address], [place], [place] [address]      Provider Number: [id number]  Attending Physician Name and Address:  Kristeen Miss, MD  Relative Name and Phone Number:  Davyn Morandi, [phone]    Current Level of Care: Hospital Recommended Level of Care: [place] Prior Approval Number:    Date Approved/Denied:   PASRR Number: Pending  Discharge Plan: SNF(Home once appropriate)    Current Diagnoses: Patient Active Problem List   Diagnosis Date Noted  . Cauda equina syndrome ([place]) 07/25/2018  . Bilateral lower extremity edema 07/25/2018  . Incarcerated inguinal hernia 05/24/2018  . Complete heart block ([place]) 08/29/2016  . 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 Pneumothorax after biopsy 08/24/2015  . Hyperlipidemia 09/14/2014  . Mobitz type 1 second degree atrioventricular block 10/17/2012  . Hx of NSTEMI tx with DES to RCA and DES to LCx in 2014 10/16/2012  . Chest pain, atypical 07/02/2012  . Malignant neoplasm of hilus of lung ([place])   . Adenopathy   . Anxiety   . Arthritis   . Hx of melanoma of skin   . Permanent atrial fibrillation   . CAD (coronary artery disease)     Orientation RESPIRATION BLADDER Height & Weight     Self, Time, Situation, Place  Normal(Room) Indwelling catheter, Incontinent(Possibly) Weight: 199 lb 4.7 oz (90.4 kg) Height:  5\' 11"  (180.3 cm)  BEHAVIORAL SYMPTOMS/MOOD NEUROLOGICAL BOWEL NUTRITION STATUS      Incontinent(Possibly) Diet(Heart healthy)  AMBULATORY STATUS COMMUNICATION OF NEEDS Skin   Extensive Assist Verbally Surgical wounds(Back from recent surgery, no special care involved)                       Personal Care Assistance Level of Assistance  Bathing, Feeding, Dressing Bathing Assistance: Maximum assistance Feeding assistance: Limited assistance Dressing Assistance: Limited assistance     Functional Limitations Info             SPECIAL CARE FACTORS FREQUENCY  PT (By licensed PT), OT (By licensed OT)     PT Frequency: 5x weekly OT Frequency: 5x weekly            Contractures Contractures Info: Not present    Additional Factors Info  Allergies   Allergies Info: Codeine           Current Medications (07/27/2018):  This is the current hospital active medication list Current Facility-Administered Medications  Medication Dose Route Frequency Provider Last Rate Last Dose  . 0.9 %  sodium chloride infusion  250 mL Intravenous Continuous Kristeen Miss, MD      . acetaminophen (TYLENOL) tablet 650 mg  650 mg Oral Q4H PRN Kristeen Miss, MD   650 mg at 07/26/18 2353   Or  . acetaminophen (TYLENOL) suppository 650 mg  650 mg Rectal Q4H PRN Kristeen Miss, MD      . alum & mag hydroxide-simeth (MAALOX/MYLANTA) 200-200-20 MG/5ML suspension 30 mL  30 mL Oral Q6H PRN Kristeen Miss, MD      . apixaban (ELIQUIS) tablet 5 mg  5 mg Oral BID Priscella Mann, RPH      . atorvastatin (LIPITOR) tablet 20 mg  20 mg Oral Daily Kristeen Miss, MD   20 mg at 07/27/18 0844  . bisacodyl (DULCOLAX) suppository 10 mg  10 mg Rectal Daily PRN Kristeen Miss, MD      . clonazePAM Bobbye [place]) tablet 1 mg  1 mg Oral TID Kristeen Miss, MD   1 mg at 07/27/18 0844  . docusate sodium (COLACE) capsule 100 mg  100 mg Oral BID Kristeen Miss, MD   100 mg at 07/27/18 0844  . furosemide (LASIX) injection 40 mg  40 mg Intravenous Daily Donne Hazel, MD   40 mg at 07/27/18 0845  . magnesium hydroxide (MILK OF MAGNESIA) suspension 30 mL  30 mL Oral Daily PRN Kristeen Miss, MD      . menthol-cetylpyridinium (CEPACOL) lozenge 3 mg  1 lozenge Oral PRN Kristeen Miss, MD       Or  . phenol  (CHLORASEPTIC) mouth spray 1 spray  1 spray Mouth/Throat PRN Kristeen Miss, MD      . methocarbamol (ROBAXIN) tablet 500 mg  500 mg Oral Q6H PRN Kristeen Miss, MD   500 mg at 07/25/18 1757   Or  . methocarbamol (ROBAXIN) 500 mg in dextrose 5 % 50 mL IVPB  500 mg Intravenous Q6H PRN Kristeen Miss, MD      . metoprolol succinate (TOPROL-XL) 24 hr tablet 25 mg  25 mg Oral QPC breakfast Kristeen Miss, MD   25 mg at 07/27/18 0844  . morphine 2 MG/ML injection 2-4 mg  2-4 mg Intravenous Q2H PRN Kristeen Miss, MD      . nitroGLYCERIN (NITROSTAT) SL tablet 0.4 mg  0.4 mg Sublingual Q5 Min x 3 PRN Kristeen Miss, MD      . ondansetron (ZOFRAN) tablet 4 mg  4 mg Oral Q6H PRN Kristeen Miss, MD       Or  . ondansetron (ZOFRAN) injection 4 mg  4 mg Intravenous Q6H PRN Kristeen Miss, MD      . oxyCODONE-acetaminophen (PERCOCET/ROXICET) 5-325 MG per tablet 1-2 tablet  1-2 tablet Oral Q3H PRN Kristeen Miss, MD   2 tablet at 07/27/18 0844  . polyethylene glycol (MIRALAX / GLYCOLAX) packet 17 g  17 g Oral Daily PRN Kristeen Miss, MD   17 g at 07/27/18 0844  . senna (SENOKOT) tablet 8.6 mg  1 tablet Oral BID Kristeen Miss, MD   8.6 mg at 07/27/18 0845  . sodium chloride flush (NS) 0.9 % injection 3 mL  3 mL Intravenous Austin Miles, MD   3 mL at 07/27/18 0846  . sodium chloride flush (NS) 0.9 % injection 3 mL  3 mL Intravenous PRN Kristeen Miss, MD      . sodium phosphate (FLEET) 7-19 GM/118ML enema 1 enema  1 enema Rectal Once PRN Kristeen Miss, MD      . tamsulosin (FLOMAX) capsule 0.4 mg  0.4 mg Oral Daily Kristeen Miss, MD         Discharge Medications: Please see discharge summary for a list of discharge medications.  Relevant Imaging Results:  Relevant Lab Results:   Additional [place], LCSW

## 2018-07-27 NOTE — Progress Notes (Addendum)
Occupational Therapy Evaluation Patient Details Name: Benjamin [name] MRN: [id number] DOB: Jul 19, 1945 Today's Date: 07/27/2018    History of Present Illness Patient is a 73 y/o male who presents with BLE weakness, balance difficulties and difficulty walking concerning for cauda equina syndrome. Now s/p L4-5 laminectomy. PMH includes lung ca, pacemaker, PAF, CAD.    Clinical Impression   PTA, pt was living at home alone, and reports he was independent with ADL/IADL and modified independent with AD with functional mobility. Pt currently requires minA for ADL and functional mobility at RW level. Pt requires heavy support on BUE limiting his ability to safely complete ADL while standing. He continues to report decreased sensation, weakness, and slight edema in BLE. Due to decline in current level of function, pt would benefit from acute OT to address established goals to facilitate safe D/C to venue listed below. At this time, recommend SNF follow-up. Will continue to follow acutely.     Follow Up Recommendations  SNF;Supervision/Assistance - 24 hour    Equipment Recommendations  3 in 1 bedside commode    Recommendations for Other Services PT consult     Precautions / Restrictions Precautions Precautions: Back;Fall Precaution Booklet Issued: Yes (comment) Precaution Comments: Reviewed back precautions Restrictions Weight Bearing Restrictions: No      Mobility Bed Mobility Overal bed mobility: Needs Assistance Bed Mobility: Sit to Sidelying         Sit to sidelying: Mod assist General bed mobility comments: ModA for BLE negotiation back into bed  Transfers Overall transfer level: Needs assistance Equipment used: Rolling walker (2 wheeled) Transfers: Sit to/from Stand Sit to Stand: Min assist         General transfer comment: Min assist to power up to stand    Balance Overall balance assessment: Needs assistance;History of Falls Sitting-balance support: Feet  supported;No upper extremity supported Sitting balance-Leahy Scale: Fair     Standing balance support: During functional activity;Bilateral upper extremity supported Standing balance-Leahy Scale: Poor Standing balance comment: Requires BUE support in standing.                            ADL either performed or assessed with clinical judgement   ADL Overall ADL's : Needs assistance/impaired Eating/Feeding: Set up;Sitting   Grooming: Minimal assistance Grooming Details (indicate cue type and reason): pt unable to static stand unsupported for longer than 30seconds Upper Body Bathing: Set up;Sitting   Lower Body Bathing: Minimal assistance;Sit to/from stand   Upper Body Dressing : Set up;Sitting   Lower Body Dressing: Minimal assistance;Sit to/from stand Lower Body Dressing Details (indicate cue type and reason): pt able to adjust socks with figure-4 technique while sitting EOB Toilet Transfer: Minimal assistance;RW;Ambulation Toilet Transfer Details (indicate cue type and reason): simulated transfer from/return to EOB;pt requires heavy reliance on UE strength;minA for stability in initial standing and descending onto seat         Functional mobility during ADLs: Minimal assistance;Rolling walker General ADL Comments: pt requires minA for stability and vc for safe hand placement;pt highly motivated to continue task, required vc to return to sitting when increasing fatigue     Vision         Perception     Praxis      Pertinent Vitals/Pain Pain Assessment: Faces Faces Pain Scale: Hurts little more Pain Location: back Pain Descriptors / Indicators: Operative site guarding;Sore Pain Intervention(s): Limited activity within patient's tolerance;Monitored during session     Hand  Dominance Right   Extremity/Trunk Assessment Upper Extremity Assessment Upper Extremity Assessment: Overall WFL for tasks assessed   Lower Extremity Assessment Lower Extremity  Assessment: RLE deficits/detail RLE Deficits / Details: Swelling present; decreased ankle AROM;noted ankle rolling with ambulation RLE Sensation: decreased light touch;decreased proprioception RLE Coordination: decreased fine motor;decreased gross motor LLE Sensation: decreased light touch;decreased proprioception       Communication Communication Communication: HOH   Cognition Arousal/Alertness: Awake/alert Behavior During Therapy: WFL for tasks assessed/performed Overall Cognitive Status: Within Functional Limits for tasks assessed                                 General Comments: pt verbalized good safety awareness, stated he did not "feel safe enough to go home" discussed SNF d/c stated he "didn't want to go, but I know its what i need to do"   General Comments  pt required vc for adherence to back precautions;able to recall 1/3 precautions;2/4 to 3/4 DOE during functional activity with multiple standing rest breaks required;with mobiltiy pt reported episodes of dizziness, pt reports he had "2 pain pills" states he feels dizziness is secondary to medication    Exercises Exercises: Other exercises Other Exercises Other Exercises: general LE exercises prior to mobility Other Exercises: general UE supine prior to mobility   Shoulder Instructions      Home Living Family/patient expects to be discharged to:: Private residence Living Arrangements: Alone Available Help at Discharge: Family;Available PRN/intermittently Type of Home: Apartment Home Access: Level entry     Home Layout: One level     Bathroom Shower/Tub: Walk-in shower(some step in)   Biochemist, clinical: Standard     Home Equipment: Environmental consultant - 2 wheels          Prior Functioning/Environment Level of Independence: Independent with assistive device(s)        Comments: Uses RW for ambulation. Able to perform ADLs. Multiple falls at home last few weeks, legs feel like "spaghetti"        OT  Problem List: Decreased strength;Decreased range of motion;Decreased activity tolerance;Impaired balance (sitting and/or standing);Decreased safety awareness;Decreased knowledge of use of DME or AE;Pain;Impaired sensation      OT Treatment/Interventions: Self-care/ADL training;Therapeutic exercise;Energy conservation;DME and/or AE instruction;Therapeutic activities;Patient/family education;Balance training    OT Goals(Current goals can be found in the care plan section) Acute Rehab OT Goals Patient Stated Goal: to get better and be independent at home OT Goal Formulation: With patient Potential to Achieve Goals: Good ADL Goals Pt Will Perform Grooming: with modified independence;standing Pt Will Perform Lower Body Bathing: with modified independence;sit to/from stand Pt Will Perform Lower Body Dressing: with modified independence;sit to/from stand Pt Will Transfer to Toilet: with modified independence Pt Will Perform Tub/Shower Transfer: with modified independence  OT Frequency: Min 2X/week   Barriers to D/C: Decreased caregiver support  pt reports he does not have family/friends to assist following d/c       Co-evaluation              AM-PAC OT "6 Clicks" Daily Activity     Outcome Measure Help from another person eating meals?: None Help from another person taking care of personal grooming?: A Little Help from another person toileting, which includes using toliet, bedpan, or urinal?: A Little Help from another person bathing (including washing, rinsing, drying)?: A Little Help from another person to put on and taking off regular upper body clothing?: None Help from another  person to put on and taking off regular lower body clothing?: A Little 6 Click Score: 20   End of Session Equipment Utilized During Treatment: Gait belt;Rolling walker Nurse Communication: Mobility status  Activity Tolerance: Patient tolerated treatment well;Patient limited by fatigue Patient left:     OT Visit Diagnosis: Unsteadiness on feet (R26.81);Other abnormalities of gait and mobility (R26.89);Muscle weakness (generalized) (M62.81);History of falling (Z91.81);Pain Pain - part of body: (back operative site)                Time: 2957-4734 OT Time Calculation (min): 45 min Charges:  OT General Charges $OT Visit: 1 Visit OT Evaluation $OT Eval Low Complexity: 1 Low OT Treatments $Self Care/Home Management : 23-37 mins $Therapeutic Exercise: 8-22 mins  Dorinda Hill OTR/L Acute Rehabilitation Services Office: [place] 07/27/2018, 3:22 PM

## 2018-07-27 NOTE — Progress Notes (Signed)
PROGRESS NOTE    EL PILE  CZY:[id number] DOB: [birth year] DOA: 07/25/2018 PCP: Lajean Manes, MD    Brief Narrative:  73 y.o. male with medical history significant of CAD s/p DES, non-small cell lung cancer with multiple recurrences s/p lobectomy and radiation, A. fib on Eliquis, complete heart block w/ PPM; who presented for lower extremity weakness.  In March of this year he had inguinal hernia repair on the right side by Dr. Brantley Stage.  Since that time he is noted that he has had increasing lower extremity swelling.  He has been having dyspnea on exertion, but relates it to his history of lung cancer that has been relatively unchanged.  Notes associated symptoms of decreased strength in his lower extremities, difficulty with balance, incomplete emptying of his bladder, change in bowel control, and and numbness tingling sensations in both legs.  He notes that his primary had tried furosemide to treat him with up to 120 mg without any change in lower extremity swelling.  Due to his other symptoms his primary had also ordered a CT scan of the lumbar spine on 4/22 to evaluate symptoms further and was found to have grade 2 spondylo-lithiasis with stenosis at L4-5 causing nerve root impingement concerning for cauda equina syndrome.  Patient underwent lumbar laminectomy of L4 and L5 with decompression of L4 and L5 nerve roots.  Patient tolerated the procedure well.  TRH was called to evaluate for patient's overall medical management.  Assessment & Plan:   Principal Problem:   Cauda equina syndrome (HCC) Active Problems:   Permanent atrial fibrillation   Complete heart block (HCC)   Bilateral lower extremity edema   1. Cauda equina syndrome: Status post lumbar laminectomy of L4 and L5 with decompression of L4-5 nerve roots. -Continuing plan per Neurosurgery -planning rehab on discharge  2. Lower extremity swelling likely secondary to acute diastolic CHF, chronicity is unknown 1. BNP noted  to be elevated at 188.3 2. 2d echo reviewed, normal LVEF 3. Pt has hx of CAD s/p stent in 2014. 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 Pt reports worsening swelling over the past 1-2 months. Cardiology clinic notes reviewed. Pt noted to have trace BLE edema at the ankles at that time 5. Given IV lasix with good results thus far 6. Will continue daily IV lasix as tolerated 7. Repeat BMET in AM 8. Would check daily weights 9. When discharged, recommend follow up with primary cardiologist, Dr. Acie Fredrickson  3. Complete heart block s/p pacemaker: Patient follows with Dr. Lovena Le of cardiology.  It appears his pacemaker was last interrogated in 08/2017.  -Pacemaker interrogated on consult  4. Atrial fibrillation on chronic anticoagulation: Patient previously on Eliquis for history of atrial fibrillation.  Heart rates appear rate controlled but are noted to be in the 50s. -Continue metoprolol as tolerated -Eliquis resumed by Neurosurgery  5. History of lung cancer:: Patient with history of non-small cell lung cancer with multiple recurrences which patient has been treated with lobectomy and radiation.  CT scan of the chestFrom 4/22 noted new sub-solid groundglass nodule in the anterior aspect of the right upper lobe. -Recommend continued outpatient work up on discharge  6. Neuropathy: Patient complaining of numbness tingling in the lower extremities.  Likely secondary to nerve impingement.  He reports that he would like to see if symptoms resolve after surgery. -Consider gabapentin if symptoms persist  DVT prophylaxis: SCD's Code Status: Full Family Communication: Pt in room, family not at bedside Disposition Plan: Rehab  being considered  Consultants:     Procedures:     Antimicrobials: Anti-infectives (From admission, onward)   Start     Dose/Rate Route Frequency Ordered Stop   07/25/18 2000  ceFAZolin (ANCEF) IVPB 2g/100 mL premix     2 g 200 mL/hr over 30  Minutes Intravenous Every 8 hours 07/25/18 1655 07/26/18 0355   07/25/18 1246  bacitracin 50,000 Units in sodium chloride 0.9 % 500 mL irrigation  Status:  Discontinued       As needed 07/25/18 1247 07/25/18 1411   07/25/18 1045  ceFAZolin (ANCEF) IVPB 2g/100 mL premix     2 g 200 mL/hr over 30 Minutes Intravenous On call to O.R. 07/25/18 1033 07/25/18 1244   07/25/18 1041  ceFAZolin (ANCEF) 2-4 GM/100ML-% IVPB    Note to Pharmacy:  Marga Melnick   : cabinet override      07/25/18 1041 07/25/18 1214      Subjective: LE swelling continuing to improve  Objective: Vitals:   07/26/18 2250 07/27/18 0424 07/27/18 0817 07/27/18 1246  BP: 106/61 131/73    Pulse: (!) 58 63    Resp: 18 20    Temp: 98.1 F (36.7 C) 98.3 F (36.8 C) (!) 97.5 F (36.4 C) 97.7 F (36.5 C)  TempSrc: Oral Oral Oral Oral  SpO2: 94% 98% 100%   Weight:      Height:        Intake/Output Summary (Last 24 hours) at 07/27/2018 1519 Last data filed at 07/27/2018 1400 Gross per 24 hour  Intake 940 ml  Output 1950 ml  Net -1010 ml   Filed Weights   07/25/18 1057 07/26/18 0500  Weight: 90.7 kg 90.4 kg    Examination: General exam: Awake, laying in bed, in nad Respiratory system: Normal respiratory effort, no wheezing Cardiovascular system: regular rate, s1, s2 Gastrointestinal system: Soft, nondistended, positive BS Central nervous system: CN2-12 grossly intact, strength intact Extremities: Perfused, no clubbing, LE edema improving Skin: Normal skin turgor, no notable skin lesions seen Psychiatry: Mood normal // no visual hallucinations   Data Reviewed: I have personally reviewed following labs and imaging studies  CBC: Recent Labs  Lab 07/23/18 1126 07/26/18 0218  WBC 6.7 13.8*  HGB 15.1 13.9  HCT 45.6 41.8  MCV 91.0 89.5  PLT 161 240*   Basic Metabolic Panel: Recent Labs  Lab 07/23/18 1126 07/26/18 0218 07/27/18 0304  NA 139 138 140  K 3.8 3.9 4.0  CL 103 102 101  CO2 28 23 29   GLUCOSE  166* 143* 95  BUN 17 16 23   CREATININE 1.05 0.93 1.09  CALCIUM 8.7* 8.6* 8.4*   GFR: Estimated Creatinine Clearance: 67.3 mL/min (by C-G formula based on SCr of 1.09 mg/dL). Liver Function Tests: No results for input(s): AST, ALT, ALKPHOS, BILITOT, PROT, ALBUMIN in the last 168 hours. No results for input(s): LIPASE, AMYLASE in the last 168 hours. No results for input(s): AMMONIA in the last 168 hours. Coagulation Profile: Recent Labs  Lab 07/23/18 1126  INR 1.2   Cardiac Enzymes: No results for input(s): CKTOTAL, CKMB, CKMBINDEX, TROPONINI in the last 168 hours. BNP (last 3 results) No results for input(s): PROBNP in the last 8760 hours. HbA1C: No results for input(s): HGBA1C in the last 72 hours. CBG: No results for input(s): GLUCAP in the last 168 hours. Lipid Profile: No results for input(s): CHOL, HDL, LDLCALC, TRIG, CHOLHDL, LDLDIRECT in the last 72 hours. Thyroid Function Tests: Recent Labs    07/26/18 0218  TSH 0.408   Anemia Panel: No results for input(s): VITAMINB12, FOLATE, FERRITIN, TIBC, IRON, RETICCTPCT in the last 72 hours. Sepsis Labs: No results for input(s): PROCALCITON, LATICACIDVEN in the last 168 hours.  Recent Results (from the past 240 hour(s))  Surgical pcr screen     Status: None   Collection Time: 07/23/18 11:26 AM  Result Value Ref Range Status   MRSA, PCR NEGATIVE NEGATIVE Final   Staphylococcus aureus NEGATIVE NEGATIVE Final    Comment: (NOTE) The Xpert SA Assay (FDA approved for NASAL specimens in patients 31 years of age and older), is one component of a comprehensive surveillance program. It is not intended to diagnose infection nor to guide or monitor treatment. Performed at [facility], [place] [address], [place], [place] [address]      Radiology Studies: No results found.  Scheduled Meds: . apixaban  5 mg Oral BID  . atorvastatin  20 mg Oral Daily  . clonazePAM  1 mg Oral TID  . docusate sodium  100 mg Oral BID  .  furosemide  40 mg Intravenous Daily  . metoprolol succinate  25 mg Oral QPC breakfast  . senna  1 tablet Oral BID  . sodium chloride flush  3 mL Intravenous Q12H  . tamsulosin  0.4 mg Oral Daily   Continuous Infusions: . sodium chloride    . methocarbamol (ROBAXIN) IV       LOS: 2 days   Marylu Lund, MD [facility] Pager On Amion  If 7PM-7AM, please contact night-coverage 07/27/2018, 3:19 PM

## 2018-07-27 NOTE — Progress Notes (Signed)
Patient ID: Benjamin [name], male   DOB: 1945/10/31, 73 y.o.   MRN: [id number] Vital signs are stable Patient's peripheral edema is further improved today Still has limited sensation and motor function of lower extremities If not a candidate for inpatient rehab will likely need placement in the SNF as he essentially lives alone His situation continues to improve physically We will ask social services to work on discharge planning possibly for early next week

## 2018-07-28 DIAGNOSIS — Z419 Encounter for procedure for purposes other than remedying health state, unspecified: Secondary | ICD-10-CM

## 2018-07-28 LAB — BASIC METABOLIC PANEL
Anion gap: 8 (ref 5–15)
BUN: 22 mg/dL (ref 8–23)
CO2: 27 mmol/L (ref 22–32)
Calcium: 8.4 mg/dL — ABNORMAL LOW (ref 8.9–10.3)
Chloride: 101 mmol/L (ref 98–111)
Creatinine, Ser: 0.95 mg/dL (ref 0.61–1.24)
GFR calc Af Amer: >60 mL/min (ref >60)
GFR calc non Af Amer: >60 mL/min (ref >60)
Glucose, Bld: 134 mg/dL — ABNORMAL HIGH (ref 70–99)
Potassium: 4.1 mmol/L (ref 3.5–5.1)
Sodium: 136 mmol/L (ref 135–145)

## 2018-07-28 NOTE — Progress Notes (Signed)
PROGRESS NOTE    Benjamin [name]  ZDG:[id number] DOB: 11/27/1945 DOA: 07/25/2018 PCP: Lajean Manes, MD    Brief Narrative:  73 y.o. male with medical history significant of CAD s/p DES, non-small cell lung cancer with multiple recurrences s/p lobectomy and radiation, A. fib on Eliquis, complete heart block w/ PPM; who presented for lower extremity weakness.  In March of this year he had inguinal hernia repair on the right side by Dr. Brantley Stage.  Since that time he is noted that he has had increasing lower extremity swelling.  He has been having dyspnea on exertion, but relates it to his history of lung cancer that has been relatively unchanged.  Notes associated symptoms of decreased strength in his lower extremities, difficulty with balance, incomplete emptying of his bladder, change in bowel control, and and numbness tingling sensations in both legs.  He notes that his primary had tried furosemide to treat him with up to 120 mg without any change in lower extremity swelling.  Due to his other symptoms his primary had also ordered a CT scan of the lumbar spine on 4/22 to evaluate symptoms further and was found to have grade 2 spondylo-lithiasis with stenosis at L4-5 causing nerve root impingement concerning for cauda equina syndrome.  Patient underwent lumbar laminectomy of L4 and L5 with decompression of L4 and L5 nerve roots.  Patient tolerated the procedure well.  TRH was called to evaluate for patient's overall medical management.  Assessment & Plan:   Principal Problem:   Cauda equina syndrome (HCC) Active Problems:   Permanent atrial fibrillation   Complete heart block (HCC)   Bilateral lower extremity edema   1. Cauda equina syndrome: Status post lumbar laminectomy of L4 and L5 with decompression of L4-5 nerve roots. -Continuing plan per Neurosurgery -Plans for rehab on d/c. SW following  2. Lower extremity swelling likely secondary to acute diastolic CHF, chronicity is unknown 1.  BNP noted to be elevated at 188.3 2. 2d echo reviewed, normal LVEF 3. Pt has hx of CAD s/p stent in 2014. 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 Pt reports worsening swelling over the past 1-2 months. Cardiology clinic notes reviewed. Pt noted to have trace BLE edema at the ankles at that time 5. Given IV lasix with good results thus far. Swelling continuing to improve 6. Will continue daily IV lasix as tolerated until time of discharge, when anticipate transition to PO lasix at that time 7. Repeat BMET in AM 8. Would check daily weights 9. When discharged, recommend follow up with primary cardiologist, Dr. Acie Fredrickson  3. Complete heart block s/p pacemaker: Patient follows with Dr. Lovena Le of cardiology.  It appears his pacemaker was last interrogated in 08/2017.  -Pacemaker interrogated on consult  4. Atrial fibrillation on chronic anticoagulation: Patient previously on Eliquis for history of atrial fibrillation.  Heart rates appear rate controlled but are noted to be in the 50s. -Continue metoprolol as tolerated -Eliquis resumed by Neurosurgery. No evidence of bleeding  5. History of lung cancer:: Patient with history of non-small cell lung cancer with multiple recurrences which patient has been treated with lobectomy and radiation.  CT scan of the chestFrom 4/22 noted new sub-solid groundglass nodule in the anterior aspect of the right upper lobe. -Recommend continued outpatient work up on discharge  6. Neuropathy: Patient complaining of numbness tingling in the lower extremities.  Likely secondary to nerve impingement.  He reports that he would like to see if symptoms resolve after surgery. -  seems stable at present  DVT prophylaxis: SCD's Code Status: Full Family Communication: Pt in room, family not at bedside Disposition Plan: Rehab being considered  Consultants:     Procedures:     Antimicrobials: Anti-infectives (From admission, onward)   Start      Dose/Rate Route Frequency Ordered Stop   07/25/18 2000  ceFAZolin (ANCEF) IVPB 2g/100 mL premix     2 g 200 mL/hr over 30 Minutes Intravenous Every 8 hours 07/25/18 1655 07/26/18 0355   07/25/18 1246  bacitracin 50,000 Units in sodium chloride 0.9 % 500 mL irrigation  Status:  Discontinued       As needed 07/25/18 1247 07/25/18 1411   07/25/18 1045  ceFAZolin (ANCEF) IVPB 2g/100 mL premix     2 g 200 mL/hr over 30 Minutes Intravenous On call to O.R. 07/25/18 1033 07/25/18 1244   07/25/18 1041  ceFAZolin (ANCEF) 2-4 GM/100ML-% IVPB    Note to Pharmacy:  Marga Melnick   : cabinet override      07/25/18 1041 07/25/18 1214      Subjective: Without complaints. States swelling is improving  Objective: Vitals:   07/28/18 0010 07/28/18 0522 07/28/18 0729 07/28/18 1127  BP: 119/66 (!) 146/81 136/71 113/75  Pulse: 61 65 68 68  Resp: 18 18 16 16   Temp: 98.3 F (36.8 C) 98.4 F (36.9 C) 98.3 F (36.8 C) 98.3 F (36.8 C)  TempSrc: Oral Oral Oral Oral  SpO2: 96% 97% 95% 96%  Weight:      Height:        Intake/Output Summary (Last 24 hours) at 07/28/2018 1452 Last data filed at 07/28/2018 1300 Gross per 24 hour  Intake 780 ml  Output 500 ml  Net 280 ml   Filed Weights   07/25/18 1057 07/26/18 0500  Weight: 90.7 kg 90.4 kg    Examination: General exam: Awake, laying in bed, in nad Respiratory system: Normal respiratory effort, no wheezing Cardiovascular system: regular rate, s1, s2 Gastrointestinal system: Soft, nondistended, positive BS Central nervous system: CN2-12 grossly intact, strength intact Extremities: Perfused, no clubbing, BLE edema much improved Skin: Normal skin turgor, no notable skin lesions seen Psychiatry: Mood normal // no visual hallucinations   Data Reviewed: I have personally reviewed following labs and imaging studies  CBC: Recent Labs  Lab 07/23/18 1126 07/26/18 0218  WBC 6.7 13.8*  HGB 15.1 13.9  HCT 45.6 41.8  MCV 91.0 89.5  PLT 161 139*    Basic Metabolic Panel: Recent Labs  Lab 07/23/18 1126 07/26/18 0218 07/27/18 0304 07/28/18 0820  NA 139 138 140 136  K 3.8 3.9 4.0 4.1  CL 103 102 101 101  CO2 28 23 29 27   GLUCOSE 166* 143* 95 134*  BUN 17 16 23 22   CREATININE 1.05 0.93 1.09 0.95  CALCIUM 8.7* 8.6* 8.4* 8.4*   GFR: Estimated Creatinine Clearance: 77.3 mL/min (by C-G formula based on SCr of 0.95 mg/dL). Liver Function Tests: No results for input(s): AST, ALT, ALKPHOS, BILITOT, PROT, ALBUMIN in the last 168 hours. No results for input(s): LIPASE, AMYLASE in the last 168 hours. No results for input(s): AMMONIA in the last 168 hours. Coagulation Profile: Recent Labs  Lab 07/23/18 1126  INR 1.2   Cardiac Enzymes: No results for input(s): CKTOTAL, CKMB, CKMBINDEX, TROPONINI in the last 168 hours. BNP (last 3 results) No results for input(s): PROBNP in the last 8760 hours. HbA1C: No results for input(s): HGBA1C in the last 72 hours. CBG: No results for  input(s): GLUCAP in the last 168 hours. Lipid Profile: No results for input(s): CHOL, HDL, LDLCALC, TRIG, CHOLHDL, LDLDIRECT in the last 72 hours. Thyroid Function Tests: Recent Labs    07/26/18 0218  TSH 0.408   Anemia Panel: No results for input(s): VITAMINB12, FOLATE, FERRITIN, TIBC, IRON, RETICCTPCT in the last 72 hours. Sepsis Labs: No results for input(s): PROCALCITON, LATICACIDVEN in the last 168 hours.  Recent Results (from the past 240 hour(s))  Surgical pcr screen     Status: None   Collection Time: 07/23/18 11:26 AM  Result Value Ref Range Status   MRSA, PCR NEGATIVE NEGATIVE Final   Staphylococcus aureus NEGATIVE NEGATIVE Final    Comment: (NOTE) The Xpert SA Assay (FDA approved for NASAL specimens in patients 95 years of age and older), is one component of a comprehensive surveillance program. It is not intended to diagnose infection nor to guide or monitor treatment. Performed at [facility], [place] [address]  [place], [place] [address]      Radiology Studies: No results found.  Scheduled Meds: . apixaban  5 mg Oral BID  . atorvastatin  20 mg Oral Daily  . clonazePAM  1 mg Oral TID  . docusate sodium  100 mg Oral BID  . furosemide  40 mg Intravenous Daily  . metoprolol succinate  25 mg Oral QPC breakfast  . senna  1 tablet Oral BID  . sodium chloride flush  3 mL Intravenous Q12H  . tamsulosin  0.4 mg Oral Daily   Continuous Infusions: . sodium chloride    . methocarbamol (ROBAXIN) IV       LOS: 3 days   Marylu Lund, MD [facility] Pager On Amion  If 7PM-7AM, please contact night-coverage 07/28/2018, 2:52 PM

## 2018-07-28 NOTE — TOC Progression Note (Signed)
Transition of Care [facility]) - Progression Note    Patient Details  Name: Benjamin [name] MRN: [id number] Date of Birth: [birth year]  Transition of Care [facility]) CM/SW [place], [place] Phone Number: 07/28/2018, 1:37 PM  Clinical Narrative:   CSW informed patient of his bed offers with the [facility] and [facility]. CSW noted patient's reservations as he did not know much about the facility. CSW provided patient with facility ratings. CSW noted patient affirmed he wanted to wait to see if [facility] accepted him. CSW discussed needing to be ready for discharge when medically appropriate. CSW noted patient did not provide approval for either facility. CSW informed him [facility] staff will continue to follow-up with patient to encourage developing a discharge plan.   CSW notes per speaking with patient's brother he has a history of indeciciveness. CSW attempted to utilize motivational skills to come to a decision. CSW noted patient was adamant about waiting and informed patient they will continue to follow-up with him.    Expected Discharge Plan: Skilled Nursing Facility Barriers to Discharge: [place] (PASRR), Insurance Authorization, Unsafe home situation  Expected Discharge Plan and Services Expected Discharge Plan: [place] Choice: [place] arrangements for the past 2 months: Single Family Home                                       Social Determinants of Health (SDOH) Interventions    Readmission Risk Interventions No flowsheet data found.

## 2018-07-28 NOTE — Progress Notes (Signed)
Pt unable to void post foley cath removal. Per bladder scan at approx 2220, 312 cc noted. Pt informed that bladder scan would be repeated around midnight. When room entered to perform the scan, 100 cc of yellow urine noted in collection bag. Situation discussed with pt, UOP will be reassessed at 0400. Of note, pt reported transient loss of feeling to his legs- currently reports full sensation. Pt requested to inform staff if loss of feeling returns.

## 2018-07-28 NOTE — Progress Notes (Signed)
At 0400, 300 cc UOP noted in collection bag. Pt called at 0500, condom catheter dislodged. Large amount of unmeasured urine noted. Peri care provided, barrier cream applied. New condom catheter applied, linen and gown change performed

## 2018-07-28 NOTE — Progress Notes (Signed)
Neurosurgery Service Progress Note  Subjective: No acute events overnight, having stable burning dysesthesias in the ankles bilaterally, which he states he had for weeks preop   Objective: Vitals:   07/27/18 2055 07/28/18 0010 07/28/18 0522 07/28/18 0729  BP: 124/78 119/66 (!) 146/81 136/71  Pulse: 60 61 65 68  Resp: 18 18 18 16   Temp: 98.1 F (36.7 C) 98.3 F (36.8 C) 98.4 F (36.9 C) 98.3 F (36.8 C)  TempSrc: Oral Oral Oral Oral  SpO2: 97% 96% 97% 95%  Weight:      Height:       Temp (24hrs), Avg:98.1 F (36.7 C), Min:97.7 F (36.5 C), Max:98.4 F (36.9 C)  CBC Latest Ref Rng & Units 07/26/2018 07/23/2018 05/26/2018  WBC 4.0 - 10.5 K/uL 13.8(H) 6.7 6.3  Hemoglobin 13.0 - 17.0 g/dL 13.9 15.1 13.5  Hematocrit 39.0 - 52.0 % 41.8 45.6 41.8  Platelets 150 - 400 K/uL 139(L) 161 123(L)   BMP Latest Ref Rng & Units 07/28/2018 07/27/2018 07/26/2018  Glucose 70 - 99 mg/dL 134(H) 95 143(H)  BUN 8 - 23 mg/dL 22 23 16   Creatinine 0.61 - 1.24 mg/dL 0.95 1.09 0.93  BUN/Creat Ratio 10 - 24 - - -  Sodium 135 - 145 mmol/L 136 140 138  Potassium 3.5 - 5.1 mmol/L 4.1 4.0 3.9  Chloride 98 - 111 mmol/L 101 101 102  CO2 22 - 32 mmol/L 27 29 23   Calcium 8.9 - 10.3 mg/dL 8.4(L) 8.4(L) 8.6(L)    Intake/Output Summary (Last 24 hours) at 07/28/2018 0920 Last data filed at 07/28/2018 0600 Gross per 24 hour  Intake 1000 ml  Output 300 ml  Net 700 ml    Current Facility-Administered Medications:  .  0.9 %  sodium chloride infusion, 250 mL, Intravenous, Continuous, Elsner, Henry, MD .  acetaminophen (TYLENOL) tablet 650 mg, 650 mg, Oral, Q4H PRN, 325 mg at 07/28/18 0910 **OR** acetaminophen (TYLENOL) suppository 650 mg, 650 mg, Rectal, Q4H PRN, Kristeen Miss, MD .  alum & mag hydroxide-simeth (MAALOX/MYLANTA) 200-200-20 MG/5ML suspension 30 mL, 30 mL, Oral, Q6H PRN, Kristeen Miss, MD .  apixaban (ELIQUIS) tablet 5 mg, 5 mg, Oral, BID, Millen, Jessica B, RPH, 5 mg at 07/28/18 0910 .  atorvastatin  (LIPITOR) tablet 20 mg, 20 mg, Oral, Daily, Kristeen Miss, MD, 20 mg at 07/28/18 0910 .  bisacodyl (DULCOLAX) suppository 10 mg, 10 mg, Rectal, Daily PRN, Kristeen Miss, MD .  clonazePAM Bobbye Charleston) tablet 1 mg, 1 mg, Oral, TID, Kristeen Miss, MD, 1 mg at 07/28/18 0910 .  docusate sodium (COLACE) capsule 100 mg, 100 mg, Oral, BID, Kristeen Miss, MD, 100 mg at 07/28/18 0910 .  furosemide (LASIX) injection 40 mg, 40 mg, Intravenous, Daily, Donne Hazel, MD, 40 mg at 07/28/18 0913 .  magnesium hydroxide (MILK OF MAGNESIA) suspension 30 mL, 30 mL, Oral, Daily PRN, Kristeen Miss, MD .  menthol-cetylpyridinium (CEPACOL) lozenge 3 mg, 1 lozenge, Oral, PRN **OR** phenol (CHLORASEPTIC) mouth spray 1 spray, 1 spray, Mouth/Throat, PRN, Kristeen Miss, MD .  methocarbamol (ROBAXIN) tablet 500 mg, 500 mg, Oral, Q6H PRN, 500 mg at 07/25/18 1757 **OR** methocarbamol (ROBAXIN) 500 mg in dextrose 5 % 50 mL IVPB, 500 mg, Intravenous, Q6H PRN, Kristeen Miss, MD .  metoprolol succinate (TOPROL-XL) 24 hr tablet 25 mg, 25 mg, Oral, QPC breakfast, Kristeen Miss, MD, 25 mg at 07/28/18 0910 .  morphine 2 MG/ML injection 2-4 mg, 2-4 mg, Intravenous, Q2H PRN, Kristeen Miss, MD .  nitroGLYCERIN (NITROSTAT) SL tablet  0.4 mg, 0.4 mg, Sublingual, Q5 Min x 3 PRN, Kristeen Miss, MD .  ondansetron (ZOFRAN) tablet 4 mg, 4 mg, Oral, Q6H PRN **OR** ondansetron (ZOFRAN) injection 4 mg, 4 mg, Intravenous, Q6H PRN, Kristeen Miss, MD .  oxyCODONE-acetaminophen (PERCOCET/ROXICET) 5-325 MG per tablet 1-2 tablet, 1-2 tablet, Oral, Q3H PRN, Kristeen Miss, MD, 2 tablet at 07/27/18 0844 .  polyethylene glycol (MIRALAX / GLYCOLAX) packet 17 g, 17 g, Oral, Daily PRN, Kristeen Miss, MD, 17 g at 07/28/18 0910 .  senna (SENOKOT) tablet 8.6 mg, 1 tablet, Oral, BID, Kristeen Miss, MD, 8.6 mg at 07/28/18 0910 .  sodium chloride flush (NS) 0.9 % injection 3 mL, 3 mL, Intravenous, Q12H, Kristeen Miss, MD, 3 mL at 07/27/18 2137 .  sodium chloride flush  (NS) 0.9 % injection 3 mL, 3 mL, Intravenous, PRN, Kristeen Miss, MD .  sodium phosphate (FLEET) 7-19 GM/118ML enema 1 enema, 1 enema, Rectal, Once PRN, Kristeen Miss, MD .  tamsulosin (FLOMAX) capsule 0.4 mg, 0.4 mg, Oral, Daily, Kristeen Miss, MD, 0.4 mg at 07/28/18 7841   Physical Exam: Strength 3/5 distally, 4/5 proximally in BLE, +b/l lower extremity edema  Assessment & Plan: 73 y.o. man with cauda equina s/p decompression, recovering well.  -SNF placement pending -no changes in neurosurgical plan of care, getting lasix from hospitalists for edema  Benjamin [name]  07/28/18 9:20 AM

## 2018-07-28 NOTE — Progress Notes (Signed)
Physical Therapy Treatment Patient Details Name: Benjamin [name] MRN: [id number] DOB: [birth year] Today's Date: 07/28/2018    History of Present Illness Patient is a 73 y/o male who presents with BLE weakness, balance difficulties and difficulty walking concerning for cauda equina syndrome. Now s/p L4-5 laminectomy. PMH includes lung ca, pacemaker, PAF, CAD.     PT Comments    Patient progressing well towards PT goals. Improved ambulation distance today with min A for balance/safety. Pt has difficulty standing from standard and low surfaces requiring assist. Also noted to have some difficulty with ADL tasks at sink requiring external support. Needs cues to maintain upright and prevent flexing trunk. Noted to have weak posterior chain and trunk musculature. 2-3/4 DOE with Sp02 >90% on RA. Fatigues after session. Reviewed back precautions.  Will continue to follow.   Follow Up Recommendations  SNF;Supervision for mobility/OOB;Supervision/Assistance - 24 hour     Equipment Recommendations  None recommended by PT    Recommendations for Other Services       Precautions / Restrictions Precautions Precautions: Back;Fall Precaution Booklet Issued: Yes (comment) Precaution Comments: Reviewed back precautions Restrictions Weight Bearing Restrictions: No    Mobility  Bed Mobility Overal bed mobility: Needs Assistance Bed Mobility: Rolling;Sidelying to Sit Rolling: Min assist Sidelying to sit: Mod assist;HOB elevated       General bed mobility comments: Cues for log roll technique, assist with trunk to get upright.  Transfers Overall transfer level: Needs assistance Equipment used: Rolling walker (2 wheeled) Transfers: Sit to/from Stand Sit to Stand: Min assist         General transfer comment: Min assist to power up to stand from EOB x1, from toilet x1, from chair x1, transferred to chair post ambulation.  Ambulation/Gait Ambulation/Gait assistance: Min assist Gait Distance  (Feet): 100 Feet Assistive device: Rolling walker (2 wheeled) Gait Pattern/deviations: Step-through pattern;Trunk flexed;Decreased dorsiflexion - right;Step-to pattern Gait velocity: decr   General Gait Details: Slow, unsteady gait with right ankle instability during mid stance and decreased DF RLE. 3 standing rest breaks. 2-3/4 DOE.    Stairs             Wheelchair Mobility    Modified Rankin (Stroke Patients Only)       Balance Overall balance assessment: Needs assistance;History of Falls Sitting-balance support: Feet supported;No upper extremity supported Sitting balance-Leahy Scale: Fair     Standing balance support: During functional activity;Bilateral upper extremity supported;Single extremity supported Standing balance-Leahy Scale: Poor Standing balance comment: Requires at least 1 UE support in standing vs external support when attempting to wash hands. Fatigues very quickly and cannot maintain standing upright, leaning on sink despite cues. Very weak posterior chair and back musculature.                            Cognition Arousal/Alertness: Awake/alert Behavior During Therapy: WFL for tasks assessed/performed Overall Cognitive Status: Within Functional Limits for tasks assessed                                        Exercises      General Comments        Pertinent Vitals/Pain Pain Assessment: Faces Faces Pain Scale: Hurts little more Pain Location: back Pain Descriptors / Indicators: Operative site guarding;Sore Pain Intervention(s): Monitored during session;Repositioned    Home Living  Prior Function            PT Goals (current goals can now be found in the care plan section) Progress towards PT goals: Progressing toward goals    Frequency    Min 5X/week      PT Plan Current plan remains appropriate    Co-evaluation              AM-PAC PT "6 Clicks" Mobility    Outcome Measure  Help needed turning from your back to your side while in a flat bed without using bedrails?: A Little Help needed moving from lying on your back to sitting on the side of a flat bed without using bedrails?: A Lot Help needed moving to and from a bed to a chair (including a wheelchair)?: A Lot Help needed standing up from a chair using your arms (e.g., wheelchair or bedside chair)?: A Lot Help needed to walk in hospital room?: A Little Help needed climbing 3-5 steps with a railing? : A Lot 6 Click Score: 14    End of Session Equipment Utilized During Treatment: Gait belt Activity Tolerance: Patient tolerated treatment well Patient left: in chair;with call bell/phone within reach Nurse Communication: Mobility status PT Visit Diagnosis: Unsteadiness on feet (R26.81);Muscle weakness (generalized) (M62.81);Difficulty in walking, not elsewhere classified (R26.2);History of falling (Z91.81);Pain Pain - part of body: (back)     Time: 1423-1500 PT Time Calculation (min) (ACUTE ONLY): 37 min  Charges:  $Gait Training: 8-22 mins $Therapeutic Activity: 8-22 mins                     Wray Kearns, [place], DPT Acute Rehabilitation Services Pager [phone] Office [place] 07/28/2018, 3:19 PM

## 2018-07-29 ENCOUNTER — Inpatient Hospital Stay (HOSPITAL_COMMUNITY): Payer: Medicare Other

## 2018-07-29 DIAGNOSIS — N390 Urinary tract infection, site not specified: Secondary | ICD-10-CM

## 2018-07-29 DIAGNOSIS — A419 Sepsis, unspecified organism: Secondary | ICD-10-CM

## 2018-07-29 LAB — CBC WITH DIFFERENTIAL/PLATELET
Abs Immature Granulocytes: 0.14 10*3/uL — ABNORMAL HIGH (ref 0.00–0.07)
Basophils Absolute: 0 10*3/uL (ref 0.0–0.1)
Basophils Relative: 0 %
Eosinophils Absolute: 0 10*3/uL (ref 0.0–0.5)
Eosinophils Relative: 0 %
HCT: 38.7 % — ABNORMAL LOW (ref 39.0–52.0)
Hemoglobin: 12.6 g/dL — ABNORMAL LOW (ref 13.0–17.0)
Immature Granulocytes: 1 %
Lymphocytes Relative: 1 %
Lymphs Abs: 0.2 10*3/uL — ABNORMAL LOW (ref 0.7–4.0)
MCH: 29.3 pg (ref 26.0–34.0)
MCHC: 32.6 g/dL (ref 30.0–36.0)
MCV: 90 fL (ref 80.0–100.0)
Monocytes Absolute: 1.3 10*3/uL — ABNORMAL HIGH (ref 0.1–1.0)
Monocytes Relative: 7 %
Neutro Abs: 17 10*3/uL — ABNORMAL HIGH (ref 1.7–7.7)
Neutrophils Relative %: 91 %
Platelets: 143 10*3/uL — ABNORMAL LOW (ref 150–400)
RBC: 4.3 MIL/uL (ref 4.22–5.81)
RDW: 13.6 % (ref 11.5–15.5)
WBC: 18.6 10*3/uL — ABNORMAL HIGH (ref 4.0–10.5)
nRBC: 0 % (ref 0.0–0.2)

## 2018-07-29 LAB — BASIC METABOLIC PANEL
Anion gap: 12 (ref 5–15)
BUN: 23 mg/dL (ref 8–23)
CO2: 29 mmol/L (ref 22–32)
Calcium: 8.6 mg/dL — ABNORMAL LOW (ref 8.9–10.3)
Chloride: 97 mmol/L — ABNORMAL LOW (ref 98–111)
Creatinine, Ser: 1 mg/dL (ref 0.61–1.24)
GFR calc Af Amer: >60 mL/min (ref >60)
GFR calc non Af Amer: >60 mL/min (ref >60)
Glucose, Bld: 112 mg/dL — ABNORMAL HIGH (ref 70–99)
Potassium: 3.9 mmol/L (ref 3.5–5.1)
Sodium: 138 mmol/L (ref 135–145)

## 2018-07-29 LAB — URINALYSIS, ROUTINE W REFLEX MICROSCOPIC
Bilirubin Urine: NEGATIVE
Glucose, UA: NEGATIVE mg/dL
Ketones, ur: 5 mg/dL — AB
Nitrite: NEGATIVE
Protein, ur: 100 mg/dL — AB
RBC / HPF: >50 RBC/hpf — ABNORMAL HIGH (ref 0–5)
Specific Gravity, Urine: 1.018 (ref 1.005–1.030)
WBC, UA: >50 WBC/hpf — ABNORMAL HIGH (ref 0–5)
pH: 8 (ref 5.0–8.0)

## 2018-07-29 LAB — ABO/RH: ABO/RH(D): O POS

## 2018-07-29 LAB — SARS CORONAVIRUS 2 BY RT PCR (HOSPITAL ORDER, PERFORMED IN ~~LOC~~ HOSPITAL LAB): SARS Coronavirus 2: NEGATIVE

## 2018-07-29 MED ORDER — SODIUM CHLORIDE 0.9 % IV SOLN
1.0000 g | INTRAVENOUS | Status: DC
  Administered 2018-07-29: 1 g via INTRAVENOUS
  Filled 2018-07-29 (×2): qty 10

## 2018-07-29 NOTE — NC FL2 (Addendum)
[place] LEVEL OF CARE SCREENING TOOL     IDENTIFICATION  Patient Name: Benjamin [name] Birthdate: 12/18/1945 Sex: male Admission Date (Current Location): 07/25/2018  [facility] and [place] Number:  Herbalist and Address:  [facility], [place] [address] [place], [place] [address]      Provider Number: [id number]  Attending Physician Name and Address:  Kristeen Miss, MD  Relative Name and Phone Number:  Shenouda Genova, [phone]    Current Level of Care: Hospital Recommended Level of Care: [place] Prior Approval Number:    Date Approved/Denied:   PASRR Number: Pending  Discharge Plan: SNF(Home once appropriate)    Current Diagnoses: Patient Active Problem List   Diagnosis Date Noted  . Cauda equina syndrome ([place]) 07/25/2018  . Bilateral lower extremity edema 07/25/2018  . Incarcerated inguinal hernia 05/24/2018  . Complete heart block ([place]) 08/29/2016  . 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 Pneumothorax after biopsy 08/24/2015  . Hyperlipidemia 09/14/2014  . Mobitz type 1 second degree atrioventricular block 10/17/2012  . Hx of NSTEMI tx with DES to RCA and DES to LCx in 2014 10/16/2012  . Chest pain, atypical 07/02/2012  . Malignant neoplasm of hilus of lung ([place])   . Adenopathy   . Anxiety   . Arthritis   . Hx of melanoma of skin   . Permanent atrial fibrillation   . CAD (coronary artery disease)     Orientation RESPIRATION BLADDER Height & Weight     Self, Time, Situation, Place  Normal(Room) Indwelling catheter, Incontinent(Possibly) Weight: 201 lb 1 oz (91.2 kg) Height:  5\' 11"  (180.3 cm)  BEHAVIORAL SYMPTOMS/MOOD NEUROLOGICAL BOWEL NUTRITION STATUS      Incontinent(Possibly) Diet(Heart healthy)  AMBULATORY STATUS COMMUNICATION OF NEEDS Skin   Extensive Assist Verbally Surgical wounds(Back from recent surgery, no special care involved)                        Personal Care Assistance Level of Assistance  Bathing, Feeding, Dressing Bathing Assistance: Maximum assistance Feeding assistance: Limited assistance Dressing Assistance: Limited assistance     Functional Limitations Info             [place]  PT (By licensed PT), OT (By licensed OT)     PT Frequency: 5x weekly OT Frequency: 5x weekly            Contractures Contractures Info: Not present    Additional Factors Info  Allergies   Allergies Info: Codeine           Current Medications (07/29/2018):  This is the current hospital active medication list Current Facility-Administered Medications  Medication Dose Route Frequency Provider Last Rate Last Dose  . 0.9 %  sodium chloride infusion  250 mL Intravenous Continuous Kristeen Miss, MD      . acetaminophen (TYLENOL) tablet 650 mg  650 mg Oral Q4H PRN Kristeen Miss, MD   650 mg at 07/29/18 3267   Or  . acetaminophen (TYLENOL) suppository 650 mg  650 mg Rectal Q4H PRN Kristeen Miss, MD      . alum & mag hydroxide-simeth (MAALOX/MYLANTA) 200-200-20 MG/5ML suspension 30 mL  30 mL Oral Q6H PRN Kristeen Miss, MD      . apixaban (ELIQUIS) tablet 5 mg  5 mg Oral BID Sloan Leiter B, RPH   5 mg at 07/29/18 0847  . atorvastatin (  LIPITOR) tablet 20 mg  20 mg Oral Daily Kristeen Miss, MD   20 mg at 07/29/18 0847  . bisacodyl (DULCOLAX) suppository 10 mg  10 mg Rectal Daily PRN Kristeen Miss, MD      . cefTRIAXone (ROCEPHIN) 1 g in sodium chloride 0.9 % 100 mL IVPB  1 g Intravenous Q24H Donne Hazel, MD      . clonazePAM Bobbye [place]) tablet 1 mg  1 mg Oral TID Kristeen Miss, MD   1 mg at 07/29/18 0847  . docusate sodium (COLACE) capsule 100 mg  100 mg Oral BID Kristeen Miss, MD   100 mg at 07/29/18 0847  . furosemide (LASIX) injection 40 mg  40 mg Intravenous Daily Donne Hazel, MD   40 mg at 07/29/18 0848  . magnesium hydroxide (MILK OF MAGNESIA) suspension 30 mL  30 mL Oral Daily PRN  Kristeen Miss, MD      . menthol-cetylpyridinium (CEPACOL) lozenge 3 mg  1 lozenge Oral PRN Kristeen Miss, MD       Or  . phenol (CHLORASEPTIC) mouth spray 1 spray  1 spray Mouth/Throat PRN Kristeen Miss, MD      . methocarbamol (ROBAXIN) tablet 500 mg  500 mg Oral Q6H PRN Kristeen Miss, MD   500 mg at 07/29/18 4097   Or  . methocarbamol (ROBAXIN) 500 mg in dextrose 5 % 50 mL IVPB  500 mg Intravenous Q6H PRN Kristeen Miss, MD      . metoprolol succinate (TOPROL-XL) 24 hr tablet 25 mg  25 mg Oral QPC breakfast Kristeen Miss, MD   25 mg at 07/28/18 0910  . morphine 2 MG/ML injection 2-4 mg  2-4 mg Intravenous Q2H PRN Kristeen Miss, MD      . nitroGLYCERIN (NITROSTAT) SL tablet 0.4 mg  0.4 mg Sublingual Q5 Min x 3 PRN Kristeen Miss, MD      . ondansetron (ZOFRAN) tablet 4 mg  4 mg Oral Q6H PRN Kristeen Miss, MD       Or  . ondansetron (ZOFRAN) injection 4 mg  4 mg Intravenous Q6H PRN Kristeen Miss, MD      . oxyCODONE-acetaminophen (PERCOCET/ROXICET) 5-325 MG per tablet 1-2 tablet  1-2 tablet Oral Q3H PRN Kristeen Miss, MD   2 tablet at 07/27/18 0844  . polyethylene glycol (MIRALAX / GLYCOLAX) packet 17 g  17 g Oral Daily PRN Kristeen Miss, MD   17 g at 07/28/18 0910  . senna (SENOKOT) tablet 8.6 mg  1 tablet Oral BID Kristeen Miss, MD   8.6 mg at 07/29/18 0847  . sodium chloride flush (NS) 0.9 % injection 3 mL  3 mL Intravenous Austin Miles, MD   3 mL at 07/28/18 2152  . sodium chloride flush (NS) 0.9 % injection 3 mL  3 mL Intravenous PRN Kristeen Miss, MD      . sodium phosphate (FLEET) 7-19 GM/118ML enema 1 enema  1 enema Rectal Once PRN Kristeen Miss, MD      . tamsulosin (FLOMAX) capsule 0.4 mg  0.4 mg Oral Daily Kristeen Miss, MD   0.4 mg at 07/29/18 3532     Discharge Medications: Please see discharge summary for a list of discharge medications.  Relevant Imaging Results:  Relevant Lab Results:   Additional Information  SS# [place], [place]

## 2018-07-29 NOTE — TOC Progression Note (Signed)
Transition of Care [facility]) - Progression Note    Patient Details  Name: Benjamin [name] MRN: [id number] Date of Birth: [birth year]  Transition of [facility] CM/SW [place], [place] Phone Number: 07/29/2018, 1:06 PM  Clinical Narrative:    CSW called and spoke with pt on phone to give update. Pt currently being treated with additional abx. COVID test is negative. Pt updated that [facility] can offer- pt hesitant with all offers stating he "hasnt decided yet." Let pt know that when he was medically stable that he needed to have made a decision regarding which facility offer he would like to accept.   PASRR also pending.    Expected Discharge Plan: Skilled Nursing Facility Barriers to Discharge: [place] (PASRR), Insurance Authorization, Unsafe home situation  Expected Discharge Plan and Services Expected Discharge Plan: [place] Choice: [place] arrangements for the past 2 months: Single Family Home  Social Determinants of Health (SDOH) Interventions    Readmission Risk Interventions No flowsheet data found.

## 2018-07-29 NOTE — Social Work (Signed)
Received message from [facility] admissions Claiborne Bill[place], CIR is signing off.  Reached out to pt brother [name] at pt request to discuss rehab offers.  HIPAA compliant message left for Ronnie at [phone].  CSW continuing to follow for support with disposition when medically appropriate.  Westley Hummer, MSW, [place] Work [phone]

## 2018-07-29 NOTE — Progress Notes (Signed)
PROGRESS NOTE    Benjamin [name]  KDX:[id number] DOB: April 15, 1945 DOA: 07/25/2018 PCP: Lajean Manes, MD    Brief Narrative:  73 y.o. male with medical history significant of CAD s/p DES, non-small cell lung cancer with multiple recurrences s/p lobectomy and radiation, A. fib on Eliquis, complete heart block w/ PPM; who presented for lower extremity weakness.  In March of this year he had inguinal hernia repair on the right side by Dr. Brantley Stage.  Since that time he is noted that he has had increasing lower extremity swelling.  He has been having dyspnea on exertion, but relates it to his history of lung cancer that has been relatively unchanged.  Notes associated symptoms of decreased strength in his lower extremities, difficulty with balance, incomplete emptying of his bladder, change in bowel control, and and numbness tingling sensations in both legs.  He notes that his primary had tried furosemide to treat him with up to 120 mg without any change in lower extremity swelling.  Due to his other symptoms his primary had also ordered a CT scan of the lumbar spine on 4/22 to evaluate symptoms further and was found to have grade 2 spondylo-lithiasis with stenosis at L4-5 causing nerve root impingement concerning for cauda equina syndrome.  Patient underwent lumbar laminectomy of L4 and L5 with decompression of L4 and L5 nerve roots.  Patient tolerated the procedure well.  TRH was called to evaluate for patient's overall medical management.  Assessment & Plan:   Principal Problem:   Cauda equina syndrome (HCC) Active Problems:   Permanent atrial fibrillation   Complete heart block (HCC)   Bilateral lower extremity edema   1. Cauda equina syndrome: Status post lumbar laminectomy of L4 and L5 with decompression of L4-5 nerve roots. -Continuing plan per Neurosurgery -Plans for rehab on d/c. SW following  2. Lower extremity swelling likely secondary to acute diastolic CHF, chronicity is unknown 1.  BNP noted to be elevated at 188.3 2. 2d echo reviewed, normal LVEF 3. Pt has hx of CAD s/p stent in 2014. 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 Pt reports worsening swelling over the past 1-2 months. Cardiology clinic notes reviewed. Pt noted to have trace BLE edema at the ankles at that time 5. Given IV lasix with good results thus far. Swelling continuing to improve 6. Will continue daily IV lasix as tolerated until time of discharge, when anticipate transition to PO lasix at that time 7. Recheck bmet in AM 8. When discharged, recommend follow up with primary cardiologist, Dr. Acie Fredrickson  3. Complete heart block s/p pacemaker: Patient follows with Dr. Lovena Le of cardiology.  It appears his pacemaker was last interrogated in 08/2017.  -Pacemaker interrogated on consult  4. Atrial fibrillation on chronic anticoagulation: Patient previously on Eliquis for history of atrial fibrillation.  Heart rates appear rate controlled but are noted to be in the 50s. -Continue metoprolol as tolerated -Eliquis resumed by Neurosurgery. No evidence of bleeding at this time  5. History of lung cancer:: Patient with history of non-small cell lung cancer with multiple recurrences which patient has been treated with lobectomy and radiation.  CT scan of the chestFrom 4/22 noted new sub-solid groundglass nodule in the anterior aspect of the right upper lobe. -Recommend continued outpatient work up on discharge  6. Neuropathy: Patient complaining of numbness tingling in the lower extremities.  Likely secondary to nerve impingement.  He reports that he would like to see if symptoms resolve after surgery. -seems stable  currently  7. Sepsis with UTI not present on admission -New problem - Overnight noted to be febrile with WBC up to 18k -COVID test ordered and reviewed, negative -Ordered and reviewed CXR. Clear -Ordered and reviewed UA. Findings suggestive of UTI -Started empiric rocephin  -Follow up on cultures  DVT prophylaxis: SCD's Code Status: Full Family Communication: Pt in room, family not at bedside Disposition Plan: Rehab being considered  Consultants:     Procedures:     Antimicrobials: Anti-infectives (From admission, onward)   Start     Dose/Rate Route Frequency Ordered Stop   07/29/18 0930  cefTRIAXone (ROCEPHIN) 1 g in sodium chloride 0.9 % 100 mL IVPB     1 g 200 mL/hr over 30 Minutes Intravenous Every 24 hours 07/29/18 0922     07/25/18 2000  ceFAZolin (ANCEF) IVPB 2g/100 mL premix     2 g 200 mL/hr over 30 Minutes Intravenous Every 8 hours 07/25/18 1655 07/26/18 0355   07/25/18 1246  bacitracin 50,000 Units in sodium chloride 0.9 % 500 mL irrigation  Status:  Discontinued       As needed 07/25/18 1247 07/25/18 1411   07/25/18 1045  ceFAZolin (ANCEF) IVPB 2g/100 mL premix     2 g 200 mL/hr over 30 Minutes Intravenous On call to O.R. 07/25/18 1033 07/25/18 1244   07/25/18 1041  ceFAZolin (ANCEF) 2-4 GM/100ML-% IVPB    Note to Pharmacy:  Marga Melnick   : cabinet override      07/25/18 1041 07/25/18 1214      Subjective: Complains of chills and sweats overnight  Objective: Vitals:   07/29/18 0604 07/29/18 0726 07/29/18 1146 07/29/18 1224  BP:  (!) 96/53  95/71  Pulse: 66 (!) 57  62  Resp: 20 16  16   Temp: (!) 103.1 F (39.5 C) 100.3 F (37.9 C) 98.3 F (36.8 C)   TempSrc: Oral Oral Oral   SpO2: 100% 93%  96%  Weight: 91.2 kg     Height:        Intake/Output Summary (Last 24 hours) at 07/29/2018 1557 Last data filed at 07/29/2018 1250 Gross per 24 hour  Intake 720 ml  Output 1350 ml  Net -630 ml   Filed Weights   07/25/18 1057 07/26/18 0500 07/29/18 0604  Weight: 90.7 kg 90.4 kg 91.2 kg    Examination: General exam: Conversant, in no acute distress Respiratory system: normal chest rise, clear, no audible wheezing Cardiovascular system: regular rhythm, s1-s2 Gastrointestinal system: Nondistended, nontender, pos BS  Central nervous system: No seizures, no tremors Extremities: No cyanosis, no joint deformities Skin: No rashes, no pallor Psychiatry: Affect normal // no auditory hallucinations   Data Reviewed: I have personally reviewed following labs and imaging studies  CBC: Recent Labs  Lab 07/23/18 1126 07/26/18 0218 07/29/18 1246  WBC 6.7 13.8* 18.6*  NEUTROABS  --   --  17.0*  HGB 15.1 13.9 12.6*  HCT 45.6 41.8 38.7*  MCV 91.0 89.5 90.0  PLT 161 139* 505*   Basic Metabolic Panel: Recent Labs  Lab 07/23/18 1126 07/26/18 0218 07/27/18 0304 07/28/18 0820 07/29/18 0429  NA 139 138 140 136 138  K 3.8 3.9 4.0 4.1 3.9  CL 103 102 101 101 97*  CO2 28 23 29 27 29   GLUCOSE 166* 143* 95 134* 112*  BUN 17 16 23 22 23   CREATININE 1.05 0.93 1.09 0.95 1.00  CALCIUM 8.7* 8.6* 8.4* 8.4* 8.6*   GFR: Estimated Creatinine Clearance: 73.8 mL/min (by  C-G formula based on SCr of 1 mg/dL). Liver Function Tests: No results for input(s): AST, ALT, ALKPHOS, BILITOT, PROT, ALBUMIN in the last 168 hours. No results for input(s): LIPASE, AMYLASE in the last 168 hours. No results for input(s): AMMONIA in the last 168 hours. Coagulation Profile: Recent Labs  Lab 07/23/18 1126  INR 1.2   Cardiac Enzymes: No results for input(s): CKTOTAL, CKMB, CKMBINDEX, TROPONINI in the last 168 hours. BNP (last 3 results) No results for input(s): PROBNP in the last 8760 hours. HbA1C: No results for input(s): HGBA1C in the last 72 hours. CBG: No results for input(s): GLUCAP in the last 168 hours. Lipid Profile: No results for input(s): CHOL, HDL, LDLCALC, TRIG, CHOLHDL, LDLDIRECT in the last 72 hours. Thyroid Function Tests: No results for input(s): TSH, T4TOTAL, FREET4, T3FREE, THYROIDAB in the last 72 hours. Anemia Panel: No results for input(s): VITAMINB12, FOLATE, FERRITIN, TIBC, IRON, RETICCTPCT in the last 72 hours. Sepsis Labs: No results for input(s): PROCALCITON, LATICACIDVEN in the last 168 hours.   Recent Results (from the past 240 hour(s))  Surgical pcr screen     Status: None   Collection Time: 07/23/18 11:26 AM  Result Value Ref Range Status   MRSA, PCR NEGATIVE NEGATIVE Final   Staphylococcus aureus NEGATIVE NEGATIVE Final    Comment: (NOTE) The Xpert SA Assay (FDA approved for NASAL specimens in patients 45 years of age and older), is one component of a comprehensive surveillance program. It is not intended to diagnose infection nor to guide or monitor treatment. Performed at [facility], [place] [address] [place], [place] [address]   SARS Coronavirus 2 (CEPHEID - Performed in [facility] hospital lab), Hosp Order     Status: None   Collection Time: 07/29/18  7:51 AM  Result Value Ref Range Status   SARS Coronavirus 2 NEGATIVE NEGATIVE Final    Comment: (NOTE) If result is NEGATIVE SARS-CoV-2 target nucleic acids are NOT DETECTED. The SARS-CoV-2 RNA is generally detectable in upper and lower  respiratory specimens during the acute phase of infection. The lowest  concentration of SARS-CoV-2 viral copies this assay can detect is 250  copies / mL. A negative result does not preclude SARS-CoV-2 infection  and should not be used as the sole basis for treatment or other  patient management decisions.  A negative result may occur with  improper specimen collection / handling, submission of specimen other  than nasopharyngeal swab, presence of viral mutation(s) within the  areas targeted by this assay, and inadequate number of viral copies  (<250 copies / mL). A negative result must be combined with clinical  observations, patient history, and epidemiological information. If result is POSITIVE SARS-CoV-2 target nucleic acids are DETECTED. The SARS-CoV-2 RNA is generally detectable in upper and lower  respiratory specimens dur ing the acute phase of infection.  Positive  results are indicative of active infection with SARS-CoV-2.  Clinical  correlation with patient  history and other diagnostic information is  necessary to determine patient infection status.  Positive results do  not rule out bacterial infection or co-infection with other viruses. If result is PRESUMPTIVE POSTIVE SARS-CoV-2 nucleic acids MAY BE PRESENT.   A presumptive positive result was obtained on the submitted specimen  and confirmed on repeat testing.  While 2019 novel coronavirus  (SARS-CoV-2) nucleic acids may be present in the submitted sample  additional confirmatory testing may be necessary for epidemiological  and / or clinical management purposes  to differentiate between  SARS-CoV-2 and other Sarbecovirus currently known to infect humans.  If clinically indicated additional testing with an alternate test  methodology [phone]) is advised. The SARS-CoV-2 RNA is generally  detectable in upper and lower respiratory sp ecimens during the acute  phase of infection. The expected result is Negative. Fact Sheet for Patients:  StrictlyIdeas.no Fact Sheet for Healthcare Providers: BankingDealers.co.za This test is not yet approved or cleared by the [place] FDA and has been authorized for detection and/or diagnosis of SARS-CoV-2 by FDA under an Emergency Use Authorization (EUA).  This EUA will remain in effect (meaning this test can be used) for the duration of the COVID-19 declaration under Section 564(b)(1) of the Act, 21 U.S.C. section 360bbb-3(b)(1), unless the authorization is terminated or revoked sooner. Performed at [facility], [place] [address], [place], [place] [address]      Radiology Studies: Dg Chest Port 1 View  Result Date: 07/29/2018 CLINICAL DATA:  Fever.  History multiple lung cancers. EXAM: PORTABLE CHEST 1 VIEW COMPARISON:  08/30/2016 chest radiograph. FINDINGS: Stable configuration of 2 lead left subclavian pacemaker. Surgical clips overlie the left upper hilum. Stable cardiomediastinal silhouette  with normal heart size. No pneumothorax. No pleural effusion. Stable volume loss in the left hemithorax. Irregular nodular opacities in the mid and upper right lung are unchanged from recent chest CT scout topogram. Consolidation in peripheral atypical left lung is chronic. No acute consolidative airspace disease. No pulmonary edema. IMPRESSION: 1. No acute cardiopulmonary disease. 2. Stable post treatment changes in the upper left hemithorax. 3. Stable irregular nodular opacities in the mid and upper right lung, please see recent chest CT report 07/10/2018 for details. Electronically Signed   By: Ilona Sorrel M.D.   On: 07/29/2018 09:16    Scheduled Meds: . apixaban  5 mg Oral BID  . atorvastatin  20 mg Oral Daily  . clonazePAM  1 mg Oral TID  . docusate sodium  100 mg Oral BID  . furosemide  40 mg Intravenous Daily  . metoprolol succinate  25 mg Oral QPC breakfast  . senna  1 tablet Oral BID  . sodium chloride flush  3 mL Intravenous Q12H  . tamsulosin  0.4 mg Oral Daily   Continuous Infusions: . sodium chloride    . cefTRIAXone (ROCEPHIN)  IV 1 g (07/29/18 1331)  . methocarbamol (ROBAXIN) IV       LOS: 4 days   Marylu Lund, MD [facility] Pager On Amion  If 7PM-7AM, please contact night-coverage 07/29/2018, 3:57 PM

## 2018-07-29 NOTE — Progress Notes (Signed)
Nurse called to room by tech, Patient new onset fever. Pupils unequal but reactive. Look to flowsheet. Neurosurgery and [facility] notified.

## 2018-07-29 NOTE — Progress Notes (Signed)
Physical Therapy Treatment Patient Details Name: Benjamin [name] MRN: [id number] DOB: Mar 28, 1945 Today's Date: 07/29/2018    History of Present Illness Patient is a 72 y/o male who presents with BLE weakness, balance difficulties and difficulty walking concerning for cauda equina syndrome. Now s/p L4-5 laminectomy. PMH includes lung ca, pacemaker, PAF, CAD.     PT Comments    Patient maintaining mobility today. Spiked a fever last night and does not feel as good as yesterday. Tolerated gait training with Min A for balance/safety but fatigues faster today. Requires standing rest breaks and noted to have 2-3/4 DOE during mobility; VSS. Continues to require cues for hand placement and assist to rise. Reviewed back precautions. BP soft today but asymptomatic. Supine BP 86/64, Sitting BP 99/58. Pt left on toilet to try to have a BM, Rn notified. Will follow.   Follow Up Recommendations  SNF;Supervision for mobility/OOB;Supervision/Assistance - 24 hour     Equipment Recommendations  None recommended by PT    Recommendations for Other Services       Precautions / Restrictions Precautions Precautions: Back;Fall Precaution Booklet Issued: Yes (comment) Precaution Comments: Reviewed back precautions Restrictions Weight Bearing Restrictions: No    Mobility  Bed Mobility Overal bed mobility: Needs Assistance Bed Mobility: Rolling;Sidelying to Sit Rolling: Min assist Sidelying to sit: Mod assist;HOB elevated       General bed mobility comments: Cues for log roll technique, assist with trunk to get upright.  Transfers Overall transfer level: Needs assistance Equipment used: Rolling walker (2 wheeled) Transfers: Sit to/from Stand Sit to Stand: Min assist;Mod assist         General transfer comment: Assist to power up to stand from EOB x1,  from chair x2, transferred to toilet post ambulation. Needs cues for hand placement.  Ambulation/Gait Ambulation/Gait assistance: Min  assist Gait Distance (Feet): 50 Feet Assistive device: Rolling walker (2 wheeled) Gait Pattern/deviations: Step-through pattern;Trunk flexed;Decreased dorsiflexion - right;Step-to pattern Gait velocity: decr   General Gait Details: Slow, unsteady gait with right ankle instability during mid stance and decreased DF RLE. 2 standing rest breaks. 2-3/4 DOE.    Stairs             Wheelchair Mobility    Modified Rankin (Stroke Patients Only)       Balance Overall balance assessment: Needs assistance;History of Falls Sitting-balance support: Feet supported;No upper extremity supported Sitting balance-Leahy Scale: Fair     Standing balance support: During functional activity;Bilateral upper extremity supported;Single extremity supported Standing balance-Leahy Scale: Poor Standing balance comment: requires UE support in standing.                            Cognition Arousal/Alertness: Awake/alert Behavior During Therapy: WFL for tasks assessed/performed Overall Cognitive Status: Within Functional Limits for tasks assessed                                        Exercises      General Comments General comments (skin integrity, edema, etc.): Pt spiked fever last night; ruled out for Covid. Feels weaker today.      Pertinent Vitals/Pain Pain Assessment: 0-10 Pain Score: 6  Pain Location: back Pain Descriptors / Indicators: Operative site guarding;Sore Pain Intervention(s): Monitored during session;Repositioned    Home Living  Prior Function            PT Goals (current goals can now be found in the care plan section) Progress towards PT goals: Not progressing toward goals - comment(secondary to fever, fatigue)    Frequency    Min 5X/week      PT Plan Current plan remains appropriate    Co-evaluation              AM-PAC PT "6 Clicks" Mobility   Outcome Measure  Help needed turning from your  back to your side while in a flat bed without using bedrails?: A Little Help needed moving from lying on your back to sitting on the side of a flat bed without using bedrails?: A Lot Help needed moving to and from a bed to a chair (including a wheelchair)?: A Lot Help needed standing up from a chair using your arms (e.g., wheelchair or bedside chair)?: A Lot Help needed to walk in hospital room?: A Little Help needed climbing 3-5 steps with a railing? : A Lot 6 Click Score: 12    End of Session Equipment Utilized During Treatment: Gait belt Activity Tolerance: Patient limited by fatigue Patient left: Other (comment)(on toilet; RN aware) Nurse Communication: Mobility status;Other (comment)(Pt on toilet) PT Visit Diagnosis: Unsteadiness on feet (R26.81);Muscle weakness (generalized) (M62.81);Difficulty in walking, not elsewhere classified (R26.2);History of falling (Z91.81);Pain Pain - part of body: (back)     Time: 5110-2111 PT Time Calculation (min) (ACUTE ONLY): 25 min  Charges:  $Gait Training: 8-22 mins $Therapeutic Activity: 8-22 mins                     Wray Kearns, [place], DPT Acute Rehabilitation Services Pager [phone] Office [place] 07/29/2018, 12:01 PM

## 2018-07-29 NOTE — Progress Notes (Signed)
Inpatient Rehab Admissions:  Inpatient Rehab Consult received.  I met with pt at the bedside for rehabilitation assessment. Noted current recommendations are for SNF. While I do feel pt would make great gains on rehab and would be able to tolerate the program,  I don't think he is able to return to a Mod I level after a short CIR stay. Pt has no caregiver support at this time and feel he would be best served at a longer term rehab facility to allow for maximal time for return of function. Pt aware of SNF recommendation. AC will sign off. CM/SW aware.  Please call if questions.  Jhonnie Garner, OTR/L  Rehab Admissions Coordinator  [phone] 07/29/2018 4:29 PM

## 2018-07-29 NOTE — Progress Notes (Signed)
Patient ID: Benjamin [name], male   DOB: [birth year], 73 y.o.   MRN: [id number] Febrile this morning to 103 Patient complains of generally feeling weak and poor today. Appears that he has a UTI Rocephin started per hospitalist service We will continue to observe

## 2018-07-30 LAB — BASIC METABOLIC PANEL
Anion gap: 9 (ref 5–15)
BUN: 24 mg/dL — ABNORMAL HIGH (ref 8–23)
CO2: 27 mmol/L (ref 22–32)
Calcium: 8.5 mg/dL — ABNORMAL LOW (ref 8.9–10.3)
Chloride: 100 mmol/L (ref 98–111)
Creatinine, Ser: 1.18 mg/dL (ref 0.61–1.24)
GFR calc Af Amer: >60 mL/min (ref >60)
GFR calc non Af Amer: >60 mL/min (ref >60)
Glucose, Bld: 145 mg/dL — ABNORMAL HIGH (ref 70–99)
Potassium: 4.2 mmol/L (ref 3.5–5.1)
Sodium: 136 mmol/L (ref 135–145)

## 2018-07-30 LAB — GLUCOSE, CAPILLARY: Glucose-Capillary: 129 mg/dL — ABNORMAL HIGH (ref 70–99)

## 2018-07-30 LAB — BLOOD CULTURE ID PANEL (REFLEXED)

## 2018-07-30 LAB — CBC
HCT: 41.9 % (ref 39.0–52.0)
Hemoglobin: 13.8 g/dL (ref 13.0–17.0)
MCH: 29.6 pg (ref 26.0–34.0)
MCHC: 32.9 g/dL (ref 30.0–36.0)
MCV: 89.9 fL (ref 80.0–100.0)
Platelets: 148 10*3/uL — ABNORMAL LOW (ref 150–400)
RBC: 4.66 MIL/uL (ref 4.22–5.81)
RDW: 13.6 % (ref 11.5–15.5)
WBC: 17.2 10*3/uL — ABNORMAL HIGH (ref 4.0–10.5)
nRBC: 0 % (ref 0.0–0.2)

## 2018-07-30 MED ORDER — SODIUM CHLORIDE 0.9 % IV SOLN
2.0000 g | INTRAVENOUS | Status: DC
  Administered 2018-07-30: 2 g via INTRAVENOUS
  Filled 2018-07-30 (×2): qty 20

## 2018-07-30 MED ORDER — HYDROMORPHONE HCL 1 MG/ML IJ SOLN
INTRAMUSCULAR | Status: AC
  Filled 2018-07-30: qty 1

## 2018-07-30 NOTE — Progress Notes (Signed)
  Patient ID: Benjamin [name], male   DOB: 24-Sep-1945, 73 y.o.   MRN: [id number] Vital signs are stable Patient notes that he is having difficulty with continence of urine and bowel Awaiting decision from him on which skilled nursing facility he would like to be discharged to His incision is clean and dry He notes a minimum of back pain. We will transfer to skilled nursing facility to continue rehab.

## 2018-07-30 NOTE — Progress Notes (Signed)
Occupational Therapy Treatment Patient Details Name: Benjamin [name] MRN: [id number] DOB: [birth year] Today's Date: 07/30/2018    History of present illness Patient is a 73 y/o male who presents with BLE weakness, balance difficulties and difficulty walking concerning for cauda equina syndrome. Now s/p L4-5 laminectomy. Pt now with sepsis due to UTI 5/11. PMH includes lung ca, pacemaker, PAF, CAD.    OT comments  Patient progressing slowly.  Completing transfers with min assist to min guard at times, but requires cueing for hand placement and safety.  Grooming standing at sink with increased tolerance to static standing but requires max cueing for adherence to precautions (habitual forward lean), completing tasks seated as fatigues.  Able to recall 3/3 precautions with 1 verbal cue (to recall lifting), but max cueing to adhere to functionally during ADls and mobility with walker mgmt.  Will continue to follow.     Follow Up Recommendations  SNF;Supervision/Assistance - 24 hour    Equipment Recommendations  3 in 1 bedside commode    Recommendations for Other Services      Precautions / Restrictions Precautions Precautions: Back;Fall Precaution Comments: Reviewed back precautions, pt able to recall 3/3 with 1 verbal cue but requires cueing to adhere to during functional tasks  Restrictions Weight Bearing Restrictions: No       Mobility Bed Mobility               General bed mobility comments: OOB upon entry  Transfers Overall transfer level: Needs assistance Equipment used: Rolling walker (2 wheeled) Transfers: Sit to/from Stand Sit to Stand: Min assist         General transfer comment: min assist to power up into standing with cueing for hand placement, safety and balance     Balance Overall balance assessment: Needs assistance;History of Falls Sitting-balance support: Feet supported;No upper extremity supported Sitting balance-Leahy Scale: Fair     Standing  balance support: During functional activity;Bilateral upper extremity supported;Single extremity supported Standing balance-Leahy Scale: Poor Standing balance comment: requires UE support in standing.                           ADL either performed or assessed with clinical judgement   ADL Overall ADL's : Needs assistance/impaired     Grooming: Min guard;Sitting;Standing;Wash/dry face;Oral care Grooming Details (indicate cue type and reason): initated oral care standing at sink with max cueing for posture, completed tasks seated due to fatigue and poor adherance to precautions (forward bending)                 Toilet Transfer: Minimal assistance;RW Toilet Transfer Details (indicate cue type and reason): simulated to/from recliner          Functional mobility during ADLs: Minimal assistance;Rolling walker General ADL Comments: pt continues to be limited by decreased act tolerance, generalized weakness, adherance to precautions      Vision       Perception     Praxis      Cognition Arousal/Alertness: Awake/alert Behavior During Therapy: WFL for tasks assessed/performed Overall Cognitive Status: Within Functional Limits for tasks assessed                                          Exercises     Shoulder Instructions       General Comments      Pertinent Vitals/  Pain       Pain Assessment: Faces Faces Pain Scale: Hurts little more Pain Location: back Pain Descriptors / Indicators: Operative site guarding;Sore Pain Intervention(s): Monitored during session;Repositioned  Home Living                                          Prior Functioning/Environment              Frequency  Min 2X/week        Progress Toward Goals  OT Goals(current goals can now be found in the care plan section)  Progress towards OT goals: Progressing toward goals  Acute Rehab OT Goals Patient Stated Goal: to get better and be  independent at home OT Goal Formulation: With patient Potential to Achieve Goals: Good  Plan Discharge plan remains appropriate;Frequency remains appropriate    Co-evaluation                 AM-PAC OT "6 Clicks" Daily Activity     Outcome Measure   Help from another person eating meals?: None Help from another person taking care of personal grooming?: A Little Help from another person toileting, which includes using toliet, bedpan, or urinal?: A Little Help from another person bathing (including washing, rinsing, drying)?: A Little Help from another person to put on and taking off regular upper body clothing?: None Help from another person to put on and taking off regular lower body clothing?: A Little 6 Click Score: 20    End of Session Equipment Utilized During Treatment: Gait belt;Rolling walker  OT Visit Diagnosis: Unsteadiness on feet (R26.81);Other abnormalities of gait and mobility (R26.89);Muscle weakness (generalized) (M62.81);History of falling (Z91.81);Pain Pain - part of body: (back)   Activity Tolerance Patient tolerated treatment well;Patient limited by fatigue   Patient Left in chair;with call bell/phone within reach   Nurse Communication Mobility status        Time: 8110-3159 OT Time Calculation (min): 32 min  Charges: OT General Charges $OT Visit: 1 Visit OT Treatments $Self Care/Home Management : 23-37 mins  Delight Stare, [place] Pager [phone] Office [phone]     Delight Stare 07/30/2018, 4:53 PM

## 2018-07-30 NOTE — Progress Notes (Addendum)
Physical Therapy Treatment Patient Details Name: RAMAL ECKHARDT MRN: [id number] DOB: 07/01/1945 Today's Date: 07/30/2018    History of Present Illness Patient is a 73 y/o male who presents with BLE weakness, balance difficulties and difficulty walking concerning for cauda equina syndrome. Now s/p L4-5 laminectomy. Pt now with sepsis due to UTI 5/11. PMH includes lung ca, pacemaker, PAF, CAD.     PT Comments    Patient feeling better today after a good night sleep. Reports having sharp electric sensation when donning socks. Pt continues to have some BLE weakness and difficulty standing requiring assist and cues for hand placement. Pt incontinent of stool walking in hallway without awareness. Cleaned up/changed gown requiring multiple stands. Of NOTE, pt with new cough today. Pt known to therapist for multiple days and this is brand new. Continues to have 2-3/4 DOE with mobility, worsened today with less activity. Reviewed back precautions. Pt reports difficulty washing hands at sink and doing ADL tasks without bending due to weakness. Will continue to follow.   Follow Up Recommendations  SNF;Supervision for mobility/OOB;Supervision/Assistance - 24 hour     Equipment Recommendations  None recommended by PT    Recommendations for Other Services       Precautions / Restrictions Precautions Precautions: Back;Fall Precaution Booklet Issued: Yes (comment) Precaution Comments: Reviewed back precautions Restrictions Weight Bearing Restrictions: No    Mobility  Bed Mobility Overal bed mobility: Needs Assistance Bed Mobility: Rolling;Sidelying to Sit Rolling: Min guard Sidelying to sit: Min assist;HOB elevated       General bed mobility comments: Cues for log roll technique, assist with trunk to get upright. Increased time and heavy use of rail.  Transfers Overall transfer level: Needs assistance Equipment used: Rolling walker (2 wheeled) Transfers: Sit to/from Stand Sit to Stand:  Mod assist;From elevated surface         General transfer comment: Assist to power up to stand from EOB x1,  from chair x2, from toilet x6 for pericare/clean up with cues for hand placement/technique; transferred to chair post ambulation. Needs cues for hand placement.  Ambulation/Gait Ambulation/Gait assistance: Min assist Gait Distance (Feet): 70 Feet Assistive device: Rolling walker (2 wheeled) Gait Pattern/deviations: Step-through pattern;Trunk flexed;Decreased dorsiflexion - right;Step-to pattern;Narrow base of support;Shuffle Gait velocity: decr   General Gait Details: Slow, unsteady gait with right ankle instability during mid stance, narrow BoS and decreased DF RLE. Also with decreased foot clearance bilaterally. 2-3 standing rest breaks. 2-3/4 DOE. Incontinent of stool during ambulation without awareness.    Stairs             Wheelchair Mobility    Modified Rankin (Stroke Patients Only)       Balance Overall balance assessment: Needs assistance;History of Falls Sitting-balance support: Feet supported;No upper extremity supported Sitting balance-Leahy Scale: Fair     Standing balance support: During functional activity;Bilateral upper extremity supported;Single extremity supported Standing balance-Leahy Scale: Poor Standing balance comment: requires UE support in standing.                            Cognition Arousal/Alertness: Awake/alert Behavior During Therapy: WFL for tasks assessed/performed Overall Cognitive Status: Within Functional Limits for tasks assessed                                 General Comments: Pt incontinent of stool in hallway, did not feel like it was happening.  Exercises      General Comments        Pertinent Vitals/Pain Pain Assessment: 0-10 Pain Score: 4  Pain Location: back Pain Descriptors / Indicators: Operative site guarding;Sore Pain Intervention(s): Monitored during  session;Repositioned    Home Living                      Prior Function            PT Goals (current goals can now be found in the care plan section) Progress towards PT goals: Progressing toward goals    Frequency    Min 5X/week      PT Plan Current plan remains appropriate    Co-evaluation              AM-PAC PT "6 Clicks" Mobility   Outcome Measure  Help needed turning from your back to your side while in a flat bed without using bedrails?: A Little Help needed moving from lying on your back to sitting on the side of a flat bed without using bedrails?: A Little Help needed moving to and from a bed to a chair (including a wheelchair)?: A Lot Help needed standing up from a chair using your arms (e.g., wheelchair or bedside chair)?: A Lot Help needed to walk in hospital room?: A Little Help needed climbing 3-5 steps with a railing? : A Lot 6 Click Score: 15    End of Session Equipment Utilized During Treatment: Gait belt Activity Tolerance: Patient tolerated treatment well Patient left: in chair;with call bell/phone within reach Nurse Communication: Mobility status PT Visit Diagnosis: Unsteadiness on feet (R26.81);Muscle weakness (generalized) (M62.81);Difficulty in walking, not elsewhere classified (R26.2);History of falling (Z91.81);Pain Pain - part of body: (back)     Time: 1572-6203 PT Time Calculation (min) (ACUTE ONLY): 40 min  Charges:  $Gait Training: 8-22 mins $Therapeutic Activity: 23-37 mins                     Wray Kearns, [place], DPT Acute Rehabilitation Services Pager [phone] Office [place] 07/30/2018, 8:51 AM

## 2018-07-30 NOTE — Progress Notes (Signed)
PHARMACY - PHYSICIAN COMMUNICATION CRITICAL VALUE ALERT - BLOOD CULTURE IDENTIFICATION (BCID)  Benjamin [name] is an 73 y.o. male who presented to [facility] on 07/25/2018 with a chief complaint of lower extremity weakness  Name of physician (or Provider) Contacted: Tylene Fantasia ([facility])  Current antibiotics: Ceftriaxone 1g IV q24h  Changes to prescribed antibiotics recommended:  Increase Ceftriaxone to 2g IV q24h   Results for orders placed or performed during the hospital encounter of 07/25/18  Blood Culture ID Panel (Reflexed) (Collected: 07/29/2018  7:48 AM)  Result Value Ref Range   Enterococcus species NOT DETECTED NOT DETECTED   Listeria monocytogenes NOT DETECTED NOT DETECTED   Staphylococcus species NOT DETECTED NOT DETECTED   Staphylococcus aureus (BCID) NOT DETECTED NOT DETECTED   Streptococcus species NOT DETECTED NOT DETECTED   Streptococcus agalactiae NOT DETECTED NOT DETECTED   Streptococcus pneumoniae NOT DETECTED NOT DETECTED   Streptococcus pyogenes NOT DETECTED NOT DETECTED   Acinetobacter baumannii NOT DETECTED NOT DETECTED   Enterobacteriaceae species DETECTED (A) NOT DETECTED   Enterobacter cloacae complex NOT DETECTED NOT DETECTED   Escherichia coli DETECTED (A) NOT DETECTED   Klebsiella oxytoca NOT DETECTED NOT DETECTED   Klebsiella pneumoniae NOT DETECTED NOT DETECTED   Proteus species NOT DETECTED NOT DETECTED   Serratia marcescens NOT DETECTED NOT DETECTED   Carbapenem resistance NOT DETECTED NOT DETECTED   Haemophilus influenzae NOT DETECTED NOT DETECTED   Neisseria meningitidis NOT DETECTED NOT DETECTED   Pseudomonas aeruginosa NOT DETECTED NOT DETECTED   Candida albicans NOT DETECTED NOT DETECTED   Candida glabrata NOT DETECTED NOT DETECTED   Candida krusei NOT DETECTED NOT DETECTED   Candida parapsilosis NOT DETECTED NOT DETECTED   Candida tropicalis NOT DETECTED NOT DETECTED    Narda Bonds 07/30/2018  1:09 AM

## 2018-07-30 NOTE — TOC Progression Note (Addendum)
Transition of Care [facility]) - Progression Note    Patient Details  Name: SULIMAN TERMINI MRN: [id number] Date of Birth: [birth year]  [facility]) CM/SW [place], [place] Phone Number: 07/30/2018, 11:56 AM  Clinical Narrative:    3:00pm- Spoke with pt over telephone and he has spoken to his brother about rehab offers, we discussed offers again, pt aware of all 4 offers. He still does not give any indication regarding which facility he wants. We discussed that pt MUST make a decision by tomorrow morning for [place] to get in touch with facility and ensure they still have a bed available. Pt seems to understand. Will follow up tomorrow morning.  11:56am-Spoke with pt brother [name] on phone, updated him with the four facility choices. He did state that pt mother passed at [facility] so he may be hesitant about going there but that pt should be able to make this decision by himself. He states he will call pt and discuss the options as well. Only other request is that pt PT call him as he feels that pt is not able to give him an accurate picture of how he is doing functionally. PT Shauna secure messaged and will contact brother.   Expected Discharge Plan: Skilled Nursing Facility Barriers to Discharge: [place] (PASRR), Insurance Authorization, Unsafe home situation  Expected Discharge Plan and Services Expected Discharge Plan: [place] Choice: [place] arrangements for the past 2 months: Single Family Home     Social Determinants of Health (SDOH) Interventions    Readmission Risk Interventions No flowsheet data found.

## 2018-07-30 NOTE — Progress Notes (Signed)
PROGRESS NOTE    Benjamin [name]  YOV:[id number] DOB: Jan 01, 1946 DOA: 07/25/2018 PCP: Lajean Manes, MD    Brief Narrative:  73 y.o. male with medical history significant of CAD s/p DES, non-small cell lung cancer with multiple recurrences s/p lobectomy and radiation, A. fib on Eliquis, complete heart block w/ PPM; who presented for lower extremity weakness.  In March of this year he had inguinal hernia repair on the right side by Dr. Brantley Stage.  Since that time he is noted that he has had increasing lower extremity swelling.  He has been having dyspnea on exertion, but relates it to his history of lung cancer that has been relatively unchanged.  Notes associated symptoms of decreased strength in his lower extremities, difficulty with balance, incomplete emptying of his bladder, change in bowel control, and and numbness tingling sensations in both legs.  He notes that his primary had tried furosemide to treat him with up to 120 mg without any change in lower extremity swelling.  Due to his other symptoms his primary had also ordered a CT scan of the lumbar spine on 4/22 to evaluate symptoms further and was found to have grade 2 spondylo-lithiasis with stenosis at L4-5 causing nerve root impingement concerning for cauda equina syndrome.  Patient underwent lumbar laminectomy of L4 and L5 with decompression of L4 and L5 nerve roots.  Patient tolerated the procedure well.  TRH was called to evaluate for patient's overall medical management.  Assessment & Plan:   Principal Problem:   Cauda equina syndrome (HCC) Active Problems:   Permanent atrial fibrillation   Complete heart block (HCC)   Bilateral lower extremity edema   1. Cauda equina syndrome: Status post lumbar laminectomy of L4 and L5 with decompression of L4-5 nerve roots. -Continuing plan per Neurosurgery -Plans for rehab on d/c. SW is following  2. Lower extremity swelling likely secondary to acute diastolic CHF, chronicity is unknown 1.  BNP noted to be elevated at 188.3 2. 2d echo reviewed, normal LVEF 3. Pt has hx of CAD s/p stent in 2014. 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 Pt reports worsening swelling over the past 1-2 months. Cardiology clinic notes reviewed. Pt noted to have trace BLE edema at the ankles at that time 5. Given IV lasix with good results thus far. Swelling continuing to improve 6. Will continue daily IV lasix as tolerated until time of discharge, when anticipate transition to PO lasix at that time 7. Recheck bmet in AM 8. On discharge, recommend follow up with primary cardiologist, Dr. Acie Fredrickson  3. Complete heart block s/p pacemaker: Patient follows with Dr. Lovena Le of cardiology.  It appears his pacemaker was last interrogated in 08/2017.  1. Seems stable at present  4. Atrial fibrillation on chronic anticoagulation: Patient previously on Eliquis for history of atrial fibrillation.  Heart rates appear rate controlled but are noted to be in the 50s. -Continue metoprolol as tolerated -Eliquis resumed by Neurosurgery. Without bleeding at this time  5. History of lung cancer:: Patient with history of non-small cell lung cancer with multiple recurrences which patient has been treated with lobectomy and radiation.  CT scan of the chestFrom 4/22 noted new sub-solid groundglass nodule in the anterior aspect of the right upper lobe. -Recommend continued outpatient work up at time of discharge  6. Neuropathy: Patient complaining of numbness tingling in the lower extremities.  Likely secondary to nerve impingement.  He reports that he would like to see if symptoms resolve after surgery. -  seems stable  currently  7. Sepsis with gm neg UTI and bacteremia not present on admission - On 5/11, noted to be febrile with WBC up to 18k -COVID test ordered and reviewed, negative -Ordered and reviewed CXR. Clear -Ordered and reviewed UA. Findings suggestive of UTI -Started empiric rocephin  -Urine cx and blood cx pos for gm neg rods. Clinically improving with current abx, will continue -Follow above speciation and sensitivities and adjust abx accordingly. Anticipate 10-14 days of abx given bacteremia  DVT prophylaxis: SCD's Code Status: Full Family Communication: Pt in room, family not at bedside Disposition Plan: Rehab pending when medically stable  Consultants:     Procedures:     Antimicrobials: Anti-infectives (From admission, onward)   Start     Dose/Rate Route Frequency Ordered Stop   07/30/18 0800  cefTRIAXone (ROCEPHIN) 2 g in sodium chloride 0.9 % 100 mL IVPB     2 g 200 mL/hr over 30 Minutes Intravenous Every 24 hours 07/30/18 0105     07/29/18 0930  cefTRIAXone (ROCEPHIN) 1 g in sodium chloride 0.9 % 100 mL IVPB  Status:  Discontinued     1 g 200 mL/hr over 30 Minutes Intravenous Every 24 hours 07/29/18 0922 07/30/18 0105   07/25/18 2000  ceFAZolin (ANCEF) IVPB 2g/100 mL premix     2 g 200 mL/hr over 30 Minutes Intravenous Every 8 hours 07/25/18 1655 07/26/18 0355   07/25/18 1246  bacitracin 50,000 Units in sodium chloride 0.9 % 500 mL irrigation  Status:  Discontinued       As needed 07/25/18 1247 07/25/18 1411   07/25/18 1045  ceFAZolin (ANCEF) IVPB 2g/100 mL premix     2 g 200 mL/hr over 30 Minutes Intravenous On call to O.R. 07/25/18 1033 07/25/18 1244   07/25/18 1041  ceFAZolin (ANCEF) 2-4 GM/100ML-% IVPB    Note to Pharmacy:  Marga Melnick   : cabinet override      07/25/18 1041 07/25/18 1214      Subjective: Reports feeling better today  Objective: Vitals:   07/30/18 0500 07/30/18 0741 07/30/18 1225 07/30/18 1605  BP:  (!) 142/72 97/63 126/72  Pulse:  61 60 60  Resp:  20    Temp:  (!) 97.3 F (36.3 C) 98.6 F (37 C) 99.9 F (37.7 C)  TempSrc:  Oral Oral Oral  SpO2:  (!) 66% 94%   Weight: 88.3 kg     Height:        Intake/Output Summary (Last 24 hours) at 07/30/2018 1640 Last data filed at 07/30/2018 0300 Gross per 24 hour   Intake -  Output 600 ml  Net -600 ml   Filed Weights   07/26/18 0500 07/29/18 0604 07/30/18 0500  Weight: 90.4 kg 91.2 kg 88.3 kg    Examination: General exam: Conversant, in no acute distress Respiratory system: normal chest rise, clear, no audible wheezing Cardiovascular system: regular rhythm, s1-s2 Gastrointestinal system: Nondistended, nontender, pos BS Central nervous system: No seizures, no tremors Extremities: No cyanosis, no joint deformities Skin: No rashes, no pallor Psychiatry: Affect normal // no auditory hallucinations   Data Reviewed: I have personally reviewed following labs and imaging studies  CBC: Recent Labs  Lab 07/26/18 0218 07/29/18 1246 07/30/18 0858  WBC 13.8* 18.6* 17.2*  NEUTROABS  --  17.0*  --   HGB 13.9 12.6* 13.8  HCT 41.8 38.7* 41.9  MCV 89.5 90.0 89.9  PLT 139* 143* 616*   Basic Metabolic Panel: Recent Labs  Lab 07/26/18  8366 07/27/18 0304 07/28/18 0820 07/29/18 0429 07/30/18 0858  NA 138 140 136 138 136  K 3.9 4.0 4.1 3.9 4.2  CL 102 101 101 97* 100  CO2 23 29 27 29 27   GLUCOSE 143* 95 134* 112* 145*  BUN 16 23 22 23  24*  CREATININE 0.93 1.09 0.95 1.00 1.18  CALCIUM 8.6* 8.4* 8.4* 8.6* 8.5*   GFR: Estimated Creatinine Clearance: 57.6 mL/min (by C-G formula based on SCr of 1.18 mg/dL). Liver Function Tests: No results for input(s): AST, ALT, ALKPHOS, BILITOT, PROT, ALBUMIN in the last 168 hours. No results for input(s): LIPASE, AMYLASE in the last 168 hours. No results for input(s): AMMONIA in the last 168 hours. Coagulation Profile: No results for input(s): INR, PROTIME in the last 168 hours. Cardiac Enzymes: No results for input(s): CKTOTAL, CKMB, CKMBINDEX, TROPONINI in the last 168 hours. BNP (last 3 results) No results for input(s): PROBNP in the last 8760 hours. HbA1C: No results for input(s): HGBA1C in the last 72 hours. CBG: Recent Labs  Lab 07/30/18 1223  GLUCAP 129*   Lipid Profile: No results for  input(s): CHOL, HDL, LDLCALC, TRIG, CHOLHDL, LDLDIRECT in the last 72 hours. Thyroid Function Tests: No results for input(s): TSH, T4TOTAL, FREET4, T3FREE, THYROIDAB in the last 72 hours. Anemia Panel: No results for input(s): VITAMINB12, FOLATE, FERRITIN, TIBC, IRON, RETICCTPCT in the last 72 hours. Sepsis Labs: No results for input(s): PROCALCITON, LATICACIDVEN in the last 168 hours.  Recent Results (from the past 240 hour(s))  Surgical pcr screen     Status: None   Collection Time: 07/23/18 11:26 AM  Result Value Ref Range Status   MRSA, PCR NEGATIVE NEGATIVE Final   Staphylococcus aureus NEGATIVE NEGATIVE Final    Comment: (NOTE) The Xpert SA Assay (FDA approved for NASAL specimens in patients 44 years of age and older), is one component of a comprehensive surveillance program. It is not intended to diagnose infection nor to guide or monitor treatment. Performed at [facility], [place] [address], [place], [place] [address]   Culture, Urine     Status: Abnormal (Preliminary result)   Collection Time: 07/29/18  7:37 AM  Result Value Ref Range Status   Specimen Description URINE, CLEAN CATCH  Final   Special Requests   Final    NONE Performed at [facility], [place] [address], [place], [place] [address]    Culture >=100,000 COLONIES/mL ESCHERICHIA COLI (A)  Final   Report Status PENDING  Incomplete  Culture, blood (routine x 2)     Status: None (Preliminary result)   Collection Time: 07/29/18  7:45 AM  Result Value Ref Range Status   Specimen Description BLOOD RIGHT ANTECUBITAL  Final   Special Requests   Final    BOTTLES DRAWN AEROBIC ONLY Blood Culture adequate volume   Culture   Final    NO GROWTH 1 DAY Performed at [facility], [place] [address], [place], [place] [address]    Report Status PENDING  Incomplete  Culture, blood (routine x 2)     Status: None (Preliminary result)   Collection Time: 07/29/18  7:48 AM  Result Value Ref Range Status   Specimen  Description BLOOD LEFT ANTECUBITAL  Final   Special Requests   Final    BOTTLES DRAWN AEROBIC AND ANAEROBIC Blood Culture adequate volume   Culture  Setup Time   Final    ANAEROBIC BOTTLE ONLY GRAM NEGATIVE RODS CRITICAL RESULT CALLED TO, READ BACK BY  AND VERIFIED WITHKarsten [facility] 07/30/18 0046 JDW Performed at [facility], [place] [address] [place], [place] [address]    Culture GRAM NEGATIVE RODS  Final   Report Status PENDING  Incomplete  Blood Culture ID Panel (Reflexed)     Status: Abnormal   Collection Time: 07/29/18  7:48 AM  Result Value Ref Range Status   Enterococcus species NOT DETECTED NOT DETECTED Final   Listeria monocytogenes NOT DETECTED NOT DETECTED Final   Staphylococcus species NOT DETECTED NOT DETECTED Final   Staphylococcus aureus (BCID) NOT DETECTED NOT DETECTED Final   Streptococcus species NOT DETECTED NOT DETECTED Final   Streptococcus agalactiae NOT DETECTED NOT DETECTED Final   Streptococcus pneumoniae NOT DETECTED NOT DETECTED Final   Streptococcus pyogenes NOT DETECTED NOT DETECTED Final   Acinetobacter baumannii NOT DETECTED NOT DETECTED Final   Enterobacteriaceae species DETECTED (A) NOT DETECTED Final    Comment: Enterobacteriaceae represent a large family of gram-negative bacteria, not a single organism. CRITICAL RESULT CALLED TO, READ BACK BY AND VERIFIED WITH: J LEDFORD PHARMD 07/30/18 0046 JDW    Enterobacter cloacae complex NOT DETECTED NOT DETECTED Final   Escherichia coli DETECTED (A) NOT DETECTED Final    Comment: CRITICAL RESULT CALLED TO, READ BACK BY AND VERIFIED WITH: J LEDFORD PHARMD 07/30/18 0046 JDW    Klebsiella oxytoca NOT DETECTED NOT DETECTED Final   Klebsiella pneumoniae NOT DETECTED NOT DETECTED Final   Proteus species NOT DETECTED NOT DETECTED Final   Serratia marcescens NOT DETECTED NOT DETECTED Final   Carbapenem resistance NOT DETECTED NOT DETECTED Final   Haemophilus influenzae NOT DETECTED NOT DETECTED Final    Neisseria meningitidis NOT DETECTED NOT DETECTED Final   Pseudomonas aeruginosa NOT DETECTED NOT DETECTED Final   Candida albicans NOT DETECTED NOT DETECTED Final   Candida glabrata NOT DETECTED NOT DETECTED Final   Candida krusei NOT DETECTED NOT DETECTED Final   Candida parapsilosis NOT DETECTED NOT DETECTED Final   Candida tropicalis NOT DETECTED NOT DETECTED Final    Comment: Performed at [facility], [place] [address], [place], [place] [address]  SARS Coronavirus 2 (CEPHEID - Performed in [facility]), Hosp Order     Status: None   Collection Time: 07/29/18  7:51 AM  Result Value Ref Range Status   SARS Coronavirus 2 NEGATIVE NEGATIVE Final    Comment: (NOTE) If result is NEGATIVE SARS-CoV-2 target nucleic acids are NOT DETECTED. The SARS-CoV-2 RNA is generally detectable in upper and lower  respiratory specimens during the acute phase of infection. The lowest  concentration of SARS-CoV-2 viral copies this assay can detect is 250  copies / mL. A negative result does not preclude SARS-CoV-2 infection  and should not be used as the sole basis for treatment or other  patient management decisions.  A negative result may occur with  improper specimen collection / handling, submission of specimen other  than nasopharyngeal swab, presence of viral mutation(s) within the  areas targeted by this assay, and inadequate number of viral copies  (<250 copies / mL). A negative result must be combined with clinical  observations, patient history, and epidemiological information. If result is POSITIVE SARS-CoV-2 target nucleic acids are DETECTED. The SARS-CoV-2 RNA is generally detectable in upper and lower  respiratory specimens dur ing the acute phase of infection.  Positive  results are indicative of active infection with SARS-CoV-2.  Clinical  correlation with patient history and other diagnostic information is  necessary to determine  patient infection status.  Positive  results do  not rule out bacterial infection or co-infection with other viruses. If result is PRESUMPTIVE POSTIVE SARS-CoV-2 nucleic acids MAY BE PRESENT.   A presumptive positive result was obtained on the submitted specimen  and confirmed on repeat testing.  While 2019 novel coronavirus  (SARS-CoV-2) nucleic acids may be present in the submitted sample  additional confirmatory testing may be necessary for epidemiological  and / or clinical management purposes  to differentiate between  SARS-CoV-2 and other Sarbecovirus currently known to infect humans.  If clinically indicated additional testing with an alternate test  methodology [phone]) is advised. The SARS-CoV-2 RNA is generally  detectable in upper and lower respiratory sp ecimens during the acute  phase of infection. The expected result is Negative. Fact Sheet for Patients:  StrictlyIdeas.no Fact Sheet for Healthcare Providers: BankingDealers.co.za This test is not yet approved or cleared by the [place] FDA and has been authorized for detection and/or diagnosis of SARS-CoV-2 by FDA under an Emergency Use Authorization (EUA).  This EUA will remain in effect (meaning this test can be used) for the duration of the COVID-19 declaration under Section 564(b)(1) of the Act, 21 U.S.C. section 360bbb-3(b)(1), unless the authorization is terminated or revoked sooner. Performed at [facility], [place] [address], [place], [place] [address]      Radiology Studies: Dg Chest Port 1 View  Result Date: 07/29/2018 CLINICAL DATA:  Fever.  History multiple lung cancers. EXAM: PORTABLE CHEST 1 VIEW COMPARISON:  08/30/2016 chest radiograph. FINDINGS: Stable configuration of 2 lead left subclavian pacemaker. Surgical clips overlie the left upper hilum. Stable cardiomediastinal silhouette with normal heart size. No pneumothorax. No pleural effusion. Stable volume loss in the left hemithorax.  Irregular nodular opacities in the mid and upper right lung are unchanged from recent chest CT scout topogram. Consolidation in peripheral atypical left lung is chronic. No acute consolidative airspace disease. No pulmonary edema. IMPRESSION: 1. No acute cardiopulmonary disease. 2. Stable post treatment changes in the upper left hemithorax. 3. Stable irregular nodular opacities in the mid and upper right lung, please see recent chest CT report 07/10/2018 for details. Electronically Signed   By: Ilona Sorrel M.D.   On: 07/29/2018 09:16    Scheduled Meds: . apixaban  5 mg Oral BID  . atorvastatin  20 mg Oral Daily  . clonazePAM  1 mg Oral TID  . docusate sodium  100 mg Oral BID  . furosemide  40 mg Intravenous Daily  . metoprolol succinate  25 mg Oral QPC breakfast  . senna  1 tablet Oral BID  . sodium chloride flush  3 mL Intravenous Q12H  . tamsulosin  0.4 mg Oral Daily   Continuous Infusions: . sodium chloride    . cefTRIAXone (ROCEPHIN)  IV 2 g (07/30/18 0630)  . methocarbamol (ROBAXIN) IV       LOS: 5 days   Marylu Lund, MD [facility] Pager On Amion  If 7PM-7AM, please contact night-coverage 07/30/2018, 4:40 PM

## 2018-07-30 NOTE — Care Management Important Message (Signed)
Important Message  Patient Details  Name: Benjamin [name] MRN: [id number] Date of Birth: 1945/09/15   Medicare Important Message Given:  Yes    Orbie Pyo 07/30/2018, 3:13 PM

## 2018-07-30 NOTE — Progress Notes (Signed)
ANTICOAGULATION CONSULT NOTE - Initial Consult  Pharmacy Consult for Apixaban Indication: atrial fibrillation  Allergies  Allergen Reactions  . Codeine Other (See Comments)    Dizziness, stomach pain   Patient Measurements: Height: 5\' 11"  (180.3 cm) Weight: 194 lb 10.7 oz (88.3 kg) IBW/kg (Calculated) : 75.3 Vital Signs: Temp: 97.3 F (36.3 C) (05/12 0741) Temp Source: Oral (05/12 0741) BP: 142/72 (05/12 0741) Pulse Rate: 61 (05/12 0741) Labs: Recent Labs    07/28/18 0820 07/29/18 0429 07/29/18 1246  HGB  --   --  12.6*  HCT  --   --  38.7*  PLT  --   --  143*  CREATININE 0.95 1.00  --     Estimated Creatinine Clearance: 68 mL/min (by C-G formula based on SCr of 1 mg/dL).   Medical History: Past Medical History:  Diagnosis Date  . Adenopathy    RIGHT ADRENAL  . Anxiety   . Arthritis    KNEES/HANDS  . CAD (coronary artery disease)    a. Prior known CTO of LCX with recanalizationby cath 2008;  b. 09/2012 NSTEMI/Cath/PCI: LM nl, LAD 35m, D1 50p, LCX 100p (2.75x16 Promus Premier DES), 80-46m, OM1 30, RCA 75m (3.0x16 Promus Premier DES), EF 50-55%.  [name] History of echocardiogram    Echo 2/18: Moderate LVH, EF 55-60, normal wall motion, trivial AI, moderate LAE, mild RVE, mild RAE, mildly elevated pulmonary pressure  . Hx of melanoma of skin   . 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 Non-small cell lung cancer ([place])    a. s/p resection and XRT.  [name] PAF (paroxysmal atrial fibrillation) ([place])    a. not currently on anticoagulation (09/2012)  . Presence of permanent cardiac pacemaker   . 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 Uses walker     Assessment: 73 year old male admitted 5/7 for spinal surgery which was performed that day. Patient has a history of atrial fibrillation on Apixaban prior to admission. Apixaban was held pre-operatively x3 days (last dose prior to admission on 07/21/18). Pharmacy has been consulted by Neurosurgery to resume Apixaban  post-operatively day #2.   H/H with slight trend down. Platelets 143. SCr is stable. No bleeding noted.   Goal of Therapy:  Monitor platelets by anticoagulation protocol: Yes   Plan:  Continue Apixaban 5mg  po BID Monitor CBC, renal function, and for any signs or symptoms of bleeding.   Sloan Leiter, PharmD, BCPS, BCCCP Clinical Pharmacist Please refer to [facility] for [place] numbers 07/30/2018,9:02 AM

## 2018-07-31 ENCOUNTER — Inpatient Hospital Stay
Admission: RE | Admit: 2018-07-31 | Discharge: 2018-08-04 | Disposition: A | Discharge status: Other Acute Inpatient Hospital | Payer: Medicare Other | Source: Ambulatory Visit | Attending: Internal Medicine | Admitting: Internal Medicine

## 2018-07-31 LAB — BASIC METABOLIC PANEL
Anion gap: 9 (ref 5–15)
BUN: 29 mg/dL — ABNORMAL HIGH (ref 8–23)
CO2: 27 mmol/L (ref 22–32)
Calcium: 8.2 mg/dL — ABNORMAL LOW (ref 8.9–10.3)
Chloride: 97 mmol/L — ABNORMAL LOW (ref 98–111)
Creatinine, Ser: 1.16 mg/dL (ref 0.61–1.24)
GFR calc Af Amer: >60 mL/min (ref >60)
GFR calc non Af Amer: >60 mL/min (ref >60)
Glucose, Bld: 124 mg/dL — ABNORMAL HIGH (ref 70–99)
Potassium: 3.7 mmol/L (ref 3.5–5.1)
Sodium: 133 mmol/L — ABNORMAL LOW (ref 135–145)

## 2018-07-31 LAB — URINE CULTURE: Culture: >=100000 — AB

## 2018-07-31 LAB — CBC
HCT: 38.5 % — ABNORMAL LOW (ref 39.0–52.0)
Hemoglobin: 12.9 g/dL — ABNORMAL LOW (ref 13.0–17.0)
MCH: 29.9 pg (ref 26.0–34.0)
MCHC: 33.5 g/dL (ref 30.0–36.0)
MCV: 89.3 fL (ref 80.0–100.0)
Platelets: 139 10*3/uL — ABNORMAL LOW (ref 150–400)
RBC: 4.31 MIL/uL (ref 4.22–5.81)
RDW: 13.4 % (ref 11.5–15.5)
WBC: 8.9 10*3/uL (ref 4.0–10.5)
nRBC: 0 % (ref 0.0–0.2)

## 2018-07-31 LAB — CULTURE, BLOOD (ROUTINE X 2): Special Requests: ADEQUATE

## 2018-07-31 MED ORDER — CIPROFLOXACIN HCL 500 MG PO TABS
500.0000 mg | ORAL_TABLET | Freq: Two times a day (BID) | ORAL | Status: DC
  Administered 2018-07-31: 500 mg via ORAL
  Filled 2018-07-31: qty 1

## 2018-07-31 MED ORDER — CIPROFLOXACIN IN D5W 400 MG/200ML IV SOLN
400.0000 mg | Freq: Two times a day (BID) | INTRAVENOUS | Status: DC
  Filled 2018-07-31 (×2): qty 200

## 2018-07-31 MED ORDER — OXYCODONE-ACETAMINOPHEN 5-325 MG PO TABS: 1.0000 | ORAL_TABLET | ORAL | 0 refills | Status: DC | PRN

## 2018-07-31 MED ORDER — CIPROFLOXACIN HCL 500 MG PO TABS: 500.0000 mg | ORAL_TABLET | Freq: Two times a day (BID) | ORAL | 0 refills | Status: DC

## 2018-07-31 NOTE — Progress Notes (Signed)
Physical Therapy Treatment Patient Details Name: Benjamin [name] MRN: [id number] DOB: 1946/01/20 Today's Date: 07/31/2018    History of Present Illness Patient is a 73 y/o male who presents with BLE weakness, balance difficulties and difficulty walking concerning for cauda equina syndrome. Now s/p L4-5 laminectomy. Pt now with sepsis due to UTI 5/11. PMH includes lung ca, pacemaker, PAF, CAD.     PT Comments    Pt progressing slowly towards his physical therapy goals. Requiring decreased assistance for transfers into standing. Pt ambulating x 50 feet with walker and min assist. Further distance limited by bowel urgency. Rest of session focused on ankle strengthening to assist with gait training. Continue to recommend SNF for ongoing Physical Therapy.      Follow Up Recommendations  SNF;Supervision for mobility/OOB;Supervision/Assistance - 24 hour     Equipment Recommendations  None recommended by PT    Recommendations for Other Services       Precautions / Restrictions Precautions Precautions: Back;Fall Precaution Comments: Reviewed back precautions, pt able to recall 3/3 with 1 verbal cue but requires cueing to adhere to during functional tasks  Restrictions Weight Bearing Restrictions: No    Mobility  Bed Mobility Overal bed mobility: Needs Assistance     Sidelying to sit: Min guard;HOB elevated     Sit to sidelying: Mod assist General bed mobility comments: modA for assisting BLE back into bed  Transfers Overall transfer level: Needs assistance Equipment used: Rolling walker (2 wheeled) Transfers: Sit to/from Stand Sit to Stand: Min assist         General transfer comment: min assist to power up into standing from edge of bed and toilet with cueing for hand placement, safety and balance   Ambulation/Gait Ambulation/Gait assistance: Min assist Gait Distance (Feet): 50 Feet Assistive device: Rolling walker (2 wheeled) Gait Pattern/deviations: Trunk  flexed;Decreased dorsiflexion - right;Step-to pattern;Narrow base of support;Shuffle Gait velocity: decr Gait velocity interpretation: <1.8 ft/sec, indicate of risk for recurrent falls General Gait Details: cues for wider BOS. trunk flexed posture throughout with poor proximity to walker. continued right ankle instability during midstance   Stairs             Wheelchair Mobility    Modified Rankin (Stroke Patients Only)       Balance Overall balance assessment: Needs assistance;History of Falls Sitting-balance support: Feet supported;No upper extremity supported Sitting balance-Leahy Scale: Fair     Standing balance support: During functional activity;Bilateral upper extremity supported;Single extremity supported Standing balance-Leahy Scale: Poor Standing balance comment: requires UE support in standing.                            Cognition Arousal/Alertness: Awake/alert Behavior During Therapy: WFL for tasks assessed/performed Overall Cognitive Status: Within Functional Limits for tasks assessed                                        Exercises General Exercises - Lower Extremity Toe Raises: 10 reps;Both;Seated Heel Raises: 10 reps;Both;Seated Other Exercises Other Exercises: Seated: right ankle eversion ROM    General Comments        Pertinent Vitals/Pain Pain Assessment: Faces Faces Pain Scale: Hurts little more Pain Location: back Pain Descriptors / Indicators: Operative site guarding;Sore Pain Intervention(s): Monitored during session    Home Living  Prior Function            PT Goals (current goals can now be found in the care plan section) Acute Rehab PT Goals Patient Stated Goal: to get better and be independent at home Potential to Achieve Goals: Good Progress towards PT goals: Progressing toward goals    Frequency    Min 5X/week      PT Plan Current plan remains appropriate     Co-evaluation              AM-PAC PT "6 Clicks" Mobility   Outcome Measure  Help needed turning from your back to your side while in a flat bed without using bedrails?: A Little Help needed moving from lying on your back to sitting on the side of a flat bed without using bedrails?: A Little Help needed moving to and from a bed to a chair (including a wheelchair)?: A Little Help needed standing up from a chair using your arms (e.g., wheelchair or bedside chair)?: A Little Help needed to walk in hospital room?: A Little Help needed climbing 3-5 steps with a railing? : A Lot 6 Click Score: 17    End of Session Equipment Utilized During Treatment: Gait belt Activity Tolerance: Patient tolerated treatment well Patient left: in bed;with call bell/phone within reach;with bed alarm set Nurse Communication: Mobility status PT Visit Diagnosis: Unsteadiness on feet (R26.81);Muscle weakness (generalized) (M62.81);Difficulty in walking, not elsewhere classified (R26.2);History of falling (Z91.81);Pain     Time: 1030-1100 PT Time Calculation (min) (ACUTE ONLY): 30 min  Charges:  $Gait Training: 8-22 mins $Therapeutic Activity: 8-22 mins                     Ellamae Sia, PT, DPT Acute Rehabilitation Services Pager [phone] Office [phone]   Willy Eddy 07/31/2018, 11:14 AM

## 2018-07-31 NOTE — Progress Notes (Addendum)
MD notified to verify discharge written prescriptions left compared to discharge summary.  Clarified the need to remove Ultram/tramadol 50 mg tabs by mouth every 6 hours from discharge summary.   Also verified that MD just wishes the Percocet/oxycodone-acetaminophen to be Rxds when contacted. This was already signed and left in the chart.    MD is in clinic and will address it as soon as he is able.    Simmie Davies RN  [phone] Called report to [place] Campbell at [place] [facility] pending MD/discharge summary issues. Pt aware of situation as well. Simmie Davies RN

## 2018-07-31 NOTE — TOC Transition Note (Signed)
Transition of Care [facility]) - CM/SW Discharge Note   Patient Details  Name: Benjamin [name] MRN: [id number] Date of Birth: 10/01/1945  Transition of Care [facility]) CM/SW Contact:  Alexander Mt, [place] Phone Number: 07/31/2018, 10:29 AM   Clinical Narrative:    Pt has all offers, chose [facility].  Call placed to [facility] at [facility]- negative COVID screen complete. Spoke with pt brother [name] and he is in agreement with plan.  Need to ensure signed controlled prescriptions prior to dc.  Final next level of care: [place] Barriers to Discharge: Barriers Resolved   Patient Goals and CMS Choice Patient states their goals for this hospitalization and ongoing recovery are:: "I want to be able to get home, do what I need to get there." CMS Medicare.gov Compare Post Acute Care list provided to:: Patient Choice offered to / list presented to : Patient  Discharge Placement PASRR number recieved: 07/31/18            Patient chooses bed at: [facility] Patient to be transferred to facility by: [place] Name of family member notified: pt brother [name] Patient and family notified of of transfer: 07/31/18  Discharge Plan and Services     Post Acute Care Choice: [place]                               Social Determinants of Health (SDOH) Interventions     Readmission Risk Interventions No flowsheet data found.

## 2018-07-31 NOTE — Discharge Summary (Signed)
Physician Discharge Summary  Patient ID: Benjamin [name] MRN: [id number] DOB/AGE: March 24, 1945 73 y.o.  Admit date: 07/25/2018 Discharge date: 07/31/2018  Admission Diagnoses: Cauda equina syndrome, atrial fibrillation, congestive heart failure, bowel and bladder incontinence  Discharge Diagnoses: Cauda equina syndrome, atrial fibrillation, congestive heart failure, bacteremia, urinary tract infection, bowel and bladder incontinence Principal Problem:   Cauda equina syndrome (HCC) Active Problems:   Permanent atrial fibrillation   Complete heart block (HCC)   Bilateral lower extremity edema   Discharged Condition: fair  Hospital Course: Patient was admitted to undergo surgical decompression at L4-L5 where he had a high-grade stenosis.  He presented with a cauda equina syndrome.  He also had severe fluid retention in both his lower extremities.  He was seen in consultation with the hospitalist and they placed him on appropriate amount of diuretics which improved his overall cardiac status and his peripheral edema.  He was noted to be continent of bowel and bladder.  He had a catheter placed for the first 24 hours after surgery because of this.  He developed a urinary tract infection with bacteremia.  Cultures were positive and he has been on IV antibiotics in the form of Keflex however it was suggested to switch him to Cipro.  He will be discharged on oral Cipro.  Consults: Hospitalist  Significant Diagnostic Studies: Urine cultures blood cultures  Treatments: surgery: Laminectomy L4-5.  Discharge Exam: Blood pressure 133/68, pulse 61, temperature 99 F (37.2 C), temperature source Oral, resp. rate 16, height 5\' 11"  (1.803 m), weight 88.2 kg, SpO2 96 %. Incision is clean and dry motor function is intact  Disposition: Discharge disposition: 03-Skilled Nursing Facility       Discharge Instructions    AMB referral to CHF clinic   Complete by:  As directed    Call MD for:  persistant  nausea and vomiting   Complete by:  As directed    Call MD for:  redness, tenderness, or signs of infection (pain, swelling, redness, odor or green/yellow discharge around incision site)   Complete by:  As directed    Call MD for:  severe uncontrolled pain   Complete by:  As directed    Diet - low sodium heart healthy   Complete by:  As directed    Incentive spirometry RT   Complete by:  As directed    Increase activity slowly   Complete by:  As directed      Allergies as of 07/31/2018      Reactions   Codeine Other (See Comments)   Dizziness, stomach pain      Medication List    TAKE these medications   acetaminophen 325 MG tablet Commonly known as:  TYLENOL Take 2 tablets (650 mg total) by mouth every 6 (six) hours as needed for mild pain (or temp > 100).   atorvastatin 20 MG tablet Commonly known as:  LIPITOR Take 20 mg by mouth daily.   ciprofloxacin 500 MG tablet Commonly known as:  Cipro Take 1 tablet (500 mg total) by mouth 2 (two) times daily.   clonazePAM 1 MG tablet Commonly known as:  KLONOPIN Take 1 mg by mouth 3 (three) times daily. 8am, 2pm, 8pm   Eliquis 5 MG Tabs tablet Generic drug:  apixaban TAKE 1 TABLET(5 MG) BY MOUTH TWICE DAILY What changed:  See the new instructions.   metoprolol succinate 25 MG 24 hr tablet Commonly known as:  TOPROL-XL TAKE 1 TABLET(25 MG) BY MOUTH DAILY What changed:  See the new instructions.   nitroGLYCERIN 0.4 MG SL tablet Commonly known as:  NITROSTAT Place 1 tablet (0.4 mg total) under the tongue every 5 (five) minutes x 3 doses as needed for chest pain.   oxyCODONE-acetaminophen 5-325 MG tablet Commonly known as:  PERCOCET/ROXICET Take 1-2 tablets by mouth every 3 (three) hours as needed for moderate pain or severe pain.   PreserVision AREDS 2 Caps Take 1 capsule by mouth 2 (two) times daily.   traMADol 50 MG tablet Commonly known as:  Ultram Take 1 tablet (50 mg total) by mouth every 6 (six) hours as needed  for moderate pain or severe pain.        Signed: Blanchie Dessert Demosthenes Virnig 07/31/2018, 9:23 AM

## 2018-07-31 NOTE — Social Work (Signed)
Clinical Social Worker facilitated patient discharge including contacting patient family and facility to confirm patient discharge plans.  Clinical information faxed to facility and family agreeable with plan.  CSW will arrange ambulance transport when pt dc summary updated via [facility] to [facility]. RN to call [phone]  with report prior to discharge.  Clinical Social Worker will sign off for now as social work intervention is no longer needed. Please consult [place] again if new need arises.  Westley Hummer, MSW, [place] Social Worker [phone]

## 2018-08-01 ENCOUNTER — Encounter: Payer: Self-pay | Admitting: Adult Health

## 2018-08-01 ENCOUNTER — Other Ambulatory Visit: Payer: Self-pay | Admitting: Adult Health

## 2018-08-01 ENCOUNTER — Non-Acute Institutional Stay (SKILLED_NURSING_FACILITY): Payer: Medicare Other | Admitting: Adult Health

## 2018-08-01 DIAGNOSIS — C3400 Malignant neoplasm of unspecified main bronchus: Secondary | ICD-10-CM | POA: Diagnosis not present

## 2018-08-01 DIAGNOSIS — I4821 Permanent atrial fibrillation: Secondary | ICD-10-CM

## 2018-08-01 DIAGNOSIS — G834 Cauda equina syndrome: Secondary | ICD-10-CM

## 2018-08-01 DIAGNOSIS — B9629 Other Escherichia coli [E. coli] as the cause of diseases classified elsewhere: Secondary | ICD-10-CM | POA: Insufficient documentation

## 2018-08-01 DIAGNOSIS — I442 Atrioventricular block, complete: Secondary | ICD-10-CM | POA: Diagnosis not present

## 2018-08-01 DIAGNOSIS — I2510 Atherosclerotic heart disease of native coronary artery without angina pectoris: Secondary | ICD-10-CM | POA: Diagnosis not present

## 2018-08-01 DIAGNOSIS — E785 Hyperlipidemia, unspecified: Secondary | ICD-10-CM

## 2018-08-01 DIAGNOSIS — Z1612 Extended spectrum beta lactamase (ESBL) resistance: Secondary | ICD-10-CM

## 2018-08-01 DIAGNOSIS — F419 Anxiety disorder, unspecified: Secondary | ICD-10-CM

## 2018-08-01 DIAGNOSIS — N390 Urinary tract infection, site not specified: Secondary | ICD-10-CM

## 2018-08-01 MED ORDER — CLONAZEPAM 1 MG PO TABS: 1.0000 mg | ORAL_TABLET | Freq: Three times a day (TID) | ORAL | 0 refills | Status: DC

## 2018-08-01 MED ORDER — OXYCODONE-ACETAMINOPHEN 5-325 MG PO TABS: 1.0000 | ORAL_TABLET | Freq: Three times a day (TID) | ORAL | 0 refills | Status: AC | PRN

## 2018-08-01 NOTE — Progress Notes (Signed)
Location:   [place] Room Number: 160 P Place of Service:  SNF (31)   CODE STATUS: Full Code  Allergies  Allergen Reactions  . Codeine Other (See Comments)    Dizziness, stomach pain    Chief Complaint  Patient presents with  . Hospitalization Follow-up    Hospital follow up    HPI:  He is a 73 year old man who has been hospitalized from 07-25-18 through 07-31-18. He was hospitalized for a L4-L5 laminectomy/decompression and microdiscectomy. He presented with cauda equina syndrome. He did develop an uti with bacteremia while hospitalized which is being treated with cipro. He states that his pain is managed and has not required use of his percocet. He denies any constipation or diarrhea. He denies any insomnia. There are no reports of fevers present. His goal is to return back home. He will continue to be followed for his chronic illnesses including: afib; hyperlipidemia; anxiety   Past Medical History:  Diagnosis Date  . Adenopathy    RIGHT ADRENAL  . Anxiety   . Arthritis    KNEES/HANDS  . CAD (coronary artery disease)    a. Prior known CTO of LCX with recanalizationby cath 2008;  b. 09/2012 NSTEMI/Cath/PCI: LM nl, LAD 25m, D1 50p, LCX 100p (2.75x16 Promus Premier DES), 80-34m, OM1 30, RCA 25m (3.0x16 Promus Premier DES), EF 50-55%.  [name] History of echocardiogram    Echo 2/18: Moderate LVH, EF 55-60, normal wall motion, trivial AI, moderate LAE, mild RVE, mild RAE, mildly elevated pulmonary pressure  . Hx of melanoma of skin   . 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 Non-small cell lung cancer ([place])    a. s/p resection and XRT.  [name] PAF (paroxysmal atrial fibrillation) ([place])    a. not currently on anticoagulation (09/2012)  . Presence of permanent cardiac pacemaker   . 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 Uses walker     Past Surgical History:  Procedure Laterality Date  . ADRENALEXTOMY  02/2000  . CARDIAC CATHETERIZATION   2008   LAD 40, D1 OK, D2 50, CFX 90, RCA OK, EF 60%; Consider PCI PRN but the stenosis is quite complex and we would end up jailing several large posterolateral branches    . HERNIA REPAIR  05/27/2018  . INGUINAL HERNIA REPAIR Right 05/27/2018   Procedure: INCARCERATED RIGHT INGUINAL HERNIA REPAIR;  Surgeon: Erroll Luna, MD;  Location: [facility];  Service: General;  Laterality: Right;  . LEFT HEART CATHETERIZATION WITH CORONARY ANGIOGRAM N/A 10/17/2012   Procedure: LEFT HEART CATHETERIZATION WITH CORONARY ANGIOGRAM;  Surgeon: Sherren Mocha, MD;  Location: [facility] CATH LAB;  Service: Cardiovascular;  Laterality: N/A;  . LOBECTOMY LEFT UPPER LOBE  08/15/2001  . LUMBAR LAMINECTOMY/DECOMPRESSION MICRODISCECTOMY N/A 07/25/2018   Procedure: Lumbar Four-Five Laminectomy;  Surgeon: Kristeen Miss, MD;  Location: [place];  Service: Neurosurgery;  Laterality: N/A;  Lumbar Four-Five Laminectomy  . PACEMAKER IMPLANT N/A 08/29/2016   Procedure: Pacemaker Implant;  Surgeon: Evans Lance, MD;  Location: [place] CV LAB;  Service: Cardiovascular;  Laterality: N/A;  . Right VATS, mini thoracotomy, wedge resection of right upper  01/18/2004    Social History   Socioeconomic History  . Marital status: Divorced    Spouse name: Not on file  . Number of children: Not on file  . Years of education: Not on file  . Highest education level: Not on file  Occupational History  . Occupation: Retired  Employer: [employer]  Social Needs  . Financial resource strain: Not on file  . Food insecurity:    Worry: Not on file    Inability: Not on file  . Transportation needs:    Medical: Not on file    Non-medical: Not on file  Tobacco Use  . Smoking status: Former Smoker    Packs/day: 1.00    Years: 40.00    Pack years: 40.00    Types: Cigarettes    Last attempt to quit: 08/15/2001    Years since quitting: 16.9  . Smokeless tobacco: Never Used  Substance and Sexual Activity  . Alcohol use: No  . Drug use:  No  . Sexual activity: Not Currently  Lifestyle  . Physical activity:    Days per week: Not on file    Minutes per session: Not on file  . Stress: Not on file  Relationships  . Social connections:    Talks on phone: Not on file    Gets together: Not on file    Attends religious service: Not on file    Active member of club or organization: Not on file    Attends meetings of clubs or organizations: Not on file    Relationship status: Not on file  . Intimate partner violence:    Fear of current or ex partner: Not on file    Emotionally abused: Not on file    Physically abused: Not on file    Forced sexual activity: Not on file  Other Topics Concern  . Not on file  Social History Narrative  . Not on file   Family History  Problem Relation Age of Onset  . Heart attack Father   . Cancer Mother        lung  . Cancer Cousin        breast      VITAL SIGNS BP (!) 149/51   Pulse 65   Temp 98 F (36.7 C)   Resp 18   Ht 5\' 11"  (1.803 m)   Wt 193 lb 1.6 oz (87.6 kg)   BMI 26.93 kg/m   Outpatient Encounter Medications as of 08/01/2018  Medication Sig  . acetaminophen (TYLENOL) 325 MG tablet Take 2 tablets (650 mg total) by mouth every 6 (six) hours as needed for mild pain (or temp > 100).  [name] apixaban (ELIQUIS) 5 MG TABS tablet Take 5 mg by mouth 2 (two) times daily.  [name] atorvastatin (LIPITOR) 20 MG tablet Take 20 mg by mouth daily.   . ciprofloxacin (CIPRO) 500 MG tablet Take 1 tablet (500 mg total) by mouth 2 (two) times daily.  . clonazePAM (KLONOPIN) 1 MG tablet Take 1 mg by mouth 3 (three) times daily. 8am, 2pm, 8pm  . LACTOBACILLUS PO Take 1 tablet by mouth 2 (two) times a day. X 2 weeks  . metoprolol tartrate (LOPRESSOR) 25 MG tablet Take 25 mg by mouth daily.  . Multiple Vitamins-Minerals (PRESERVISION AREDS 2) CAPS Take 1 capsule by mouth 2 (two) times daily.  . nitroGLYCERIN (NITROSTAT) 0.4 MG SL tablet Place 1 tablet (0.4 mg total) under the tongue every 5 (five)  minutes x 3 doses as needed for chest pain.  . NON FORMULARY Diet Type:  NAS  . oxyCODONE-acetaminophen (PERCOCET/ROXICET) 5-325 MG tablet Take 1-2 tablets by mouth every 4 (four) hours as needed for severe pain.   No facility-administered encounter medications on file as of 08/01/2018.      SIGNIFICANT DIAGNOSTIC EXAMS  TODAY:  07-29-18: chest x-ray:  1. No acute cardiopulmonary disease. 2. Stable post treatment changes in the upper left hemithorax. 3. Stable irregular nodular opacities in the mid and upper right lung, please see recent chest CT report 07/10/2018 for details.  LABS REVIEWED TODAY:   07-26-18 wbc 13.8; hgb 13.9; hct 41.8; mcv 89.5 ;plt 139; glucose 143; bun 16; creat 0.93; k+ 3.9; na++ 138; ca 8.6 tsh 0.408 07-29-18: wbc 18.6; hgb 12.6; hct 38.7; mcv 90.0; plt 143  glucose 112; bun 23; creat 1.00; k+ 3.9; na++ 138; ca 8.6 urine culture: e-coli ESBL: cipro   Blood culture: enterobacteriaceae species  07-31-18: wbc 8.9; hgb 12.9; hct 38.5; mcv 89.3 ;plt 139; glucose 124; bun 29; creat 1.16; k+ 3.7; na++ 133; ca 8.2   Review of Systems  Constitutional: Negative for malaise/fatigue.  Respiratory: Negative for cough and shortness of breath.   Cardiovascular: Negative for chest pain, palpitations and leg swelling.  Gastrointestinal: Negative for abdominal pain, constipation and heartburn.       Incontinent of bowel   Genitourinary:       Incontinent of urine  Musculoskeletal: Negative for back pain, joint pain and myalgias.  Skin: Negative.   Neurological: Positive for weakness. Negative for dizziness.       Bilateral lower extremity weakness and numbness   Psychiatric/Behavioral: The patient is not nervous/anxious.    Physical Exam Constitutional:      General: He is not in acute distress.    Appearance: He is well-developed and overweight. He is not diaphoretic.  Neck:     Musculoskeletal: Neck supple.     Thyroid: No thyromegaly.  Cardiovascular:     Rate and  Rhythm: Normal rate and regular rhythm.     Pulses: Normal pulses.     Heart sounds: Normal heart sounds.     Comments: Psychologist, forensic present  Pulmonary:     Effort: Pulmonary effort is normal. No respiratory distress.     Breath sounds: Normal breath sounds.     Comments: History of left upper lobectomy and right upper lobe wedge resection  Abdominal:     General: Bowel sounds are normal. There is no distension.     Palpations: Abdomen is soft.     Tenderness: There is no abdominal tenderness.  Musculoskeletal:     Right lower leg: Edema present.     Left lower leg: Edema present.     Comments: Trace bilateral lower extremities Is able to move all extremities Has bilateral lower extremity weakness   Lymphadenopathy:     Cervical: No cervical adenopathy.  Skin:    General: Skin is warm and dry.     Comments: Incision line without signs of infection   Neurological:     Mental Status: He is alert and oriented to person, place, and time.  Psychiatric:        Mood and Affect: Mood normal.       ASSESSMENT/ PLAN:  TODAY:   1.  Permanent atrial fibrillation:/ complete heart block: is status post pace maker insertion: will continue lopressor 25 mg twice daily forrate control; and is on long term eliquis 5 mg twice daily   2. Coronary artery disease involving native coronary artery of native heart without angina pectoris: is stable will continue lopressor 25 mg twice daily has prn ntg is on eliquis and statin  3. Malignant neoplasm of hilus of lung unspecified laterality: is status post surgical and radiation therapy; will monitor  4. Cauda equina syndrome: is status  post L4-L5 laminectomy: will continue therapy as directed and will follow up with neurosurgeon as indicated will lower percocet to 5/325 mg every 8 hours as needed for 7 days. Will monitor. He has an external cath due to inability to hold urine; continues with bowel incontinence and bilateral lower extremity numbness  5.  Hyperlipidemia unspecified hyperlipidemia type: is stable will continue lipitor 20 mg daily   6. Anxiety: is stable will continue klonopin 1 mg three times daily   7.  UTI due to extended spectrum beta lactamase (ESBL) producing escherichia coli: will complete cipro and will monitor his status.         MD is aware of resident's narcotic use and is in agreement with current plan of care. We will attempt to wean resident as apropriate   Ok Edwards NP [facility]  Contact [phone] Monday through Friday 8am- 5pm  After hours call [phone]

## 2018-08-01 NOTE — Progress Notes (Signed)
Patient discharged to  [facility] via [facility] [place]. [name] Personal belongings with patient.

## 2018-08-02 ENCOUNTER — Non-Acute Institutional Stay (SKILLED_NURSING_FACILITY): Payer: Medicare Other | Admitting: Adult Health

## 2018-08-02 ENCOUNTER — Encounter: Payer: Self-pay | Admitting: Adult Health

## 2018-08-02 DIAGNOSIS — B9629 Other Escherichia coli [E. coli] as the cause of diseases classified elsewhere: Secondary | ICD-10-CM

## 2018-08-02 DIAGNOSIS — Z1612 Extended spectrum beta lactamase (ESBL) resistance: Secondary | ICD-10-CM

## 2018-08-02 DIAGNOSIS — N390 Urinary tract infection, site not specified: Secondary | ICD-10-CM | POA: Diagnosis not present

## 2018-08-02 DIAGNOSIS — G834 Cauda equina syndrome: Secondary | ICD-10-CM | POA: Diagnosis not present

## 2018-08-02 NOTE — Progress Notes (Signed)
Location:    [place] Room Number: 160/P Place of Service:  SNF (31)   CODE STATUS: Full Code  Allergies  Allergen Reactions  . Codeine Other (See Comments)    Dizziness, stomach pain    Chief Complaint  Patient presents with  . Acute Visit    72 Hour Care Plan    HPI:  We have come together for his 76 hour care plan meeting. His goal is to return back home. He will need a 3;1 and shower chair. He has all other needed dme. He will need home health for pt/ot. He had been hospitalized for a L4-L5 laminectomy. He is here for short term rehab. He denies any changes in his appetite; no uncontrolled pain no constipation. There are no reports of fevers present.    Past Medical History:  Diagnosis Date  . Adenopathy    RIGHT ADRENAL  . Anxiety   . Arthritis    KNEES/HANDS  . CAD (coronary artery disease)    a. Prior known CTO of LCX with recanalizationby cath 2008;  b. 09/2012 NSTEMI/Cath/PCI: LM nl, LAD 19m, D1 50p, LCX 100p (2.75x16 Promus Premier DES), 80-74m, OM1 30, RCA 52m (3.0x16 Promus Premier DES), EF 50-55%.  [name] History of echocardiogram    Echo 2/18: Moderate LVH, EF 55-60, normal wall motion, trivial AI, moderate LAE, mild RVE, mild RAE, mildly elevated pulmonary pressure  . Hx of melanoma of skin   . 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 Non-small cell lung cancer ([place])    a. s/p resection and XRT.  [name] PAF (paroxysmal atrial fibrillation) ([place])    a. not currently on anticoagulation (09/2012)  . Presence of permanent cardiac pacemaker   . 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 Uses walker     Past Surgical History:  Procedure Laterality Date  . ADRENALEXTOMY  02/2000  . CARDIAC CATHETERIZATION  2008   LAD 40, D1 OK, D2 50, CFX 90, RCA OK, EF 60%; Consider PCI PRN but the stenosis is quite complex and we would end up jailing several large posterolateral branches    . HERNIA REPAIR  05/27/2018  . INGUINAL HERNIA REPAIR  Right 05/27/2018   Procedure: INCARCERATED RIGHT INGUINAL HERNIA REPAIR;  Surgeon: Erroll Luna, MD;  Location: [place];  Service: General;  Laterality: Right;  . LEFT HEART CATHETERIZATION WITH CORONARY ANGIOGRAM N/A 10/17/2012   Procedure: LEFT HEART CATHETERIZATION WITH CORONARY ANGIOGRAM;  Surgeon: Sherren Mocha, MD;  Location: [facility] CATH LAB;  Service: Cardiovascular;  Laterality: N/A;  . LOBECTOMY LEFT UPPER LOBE  08/15/2001  . LUMBAR LAMINECTOMY/DECOMPRESSION MICRODISCECTOMY N/A 07/25/2018   Procedure: Lumbar Four-Five Laminectomy;  Surgeon: Kristeen Miss, MD;  Location: [place];  Service: Neurosurgery;  Laterality: N/A;  Lumbar Four-Five Laminectomy  . PACEMAKER IMPLANT N/A 08/29/2016   Procedure: Pacemaker Implant;  Surgeon: Evans Lance, MD;  Location: [place] CV LAB;  Service: Cardiovascular;  Laterality: N/A;  . Right VATS, mini thoracotomy, wedge resection of right upper  01/18/2004    Social History   Socioeconomic History  . Marital status: Divorced    Spouse name: Not on file  . Number of children: Not on file  . Years of education: Not on file  . Highest education level: Not on file  Occupational History  . Occupation: Retired    Fish farm manager: [employer]  . Financial resource strain: Not on file  . Food insecurity:    Worry:  Not on file    Inability: Not on file  . Transportation needs:    Medical: Not on file    Non-medical: Not on file  Tobacco Use  . Smoking status: Former Smoker    Packs/day: 1.00    Years: 40.00    Pack years: 40.00    Types: Cigarettes    Last attempt to quit: 08/15/2001    Years since quitting: 16.9  . Smokeless tobacco: Never Used  Substance and Sexual Activity  . Alcohol use: No  . Drug use: No  . Sexual activity: Not Currently  Lifestyle  . Physical activity:    Days per week: Not on file    Minutes per session: Not on file  . Stress: Not on file  Relationships  . Social connections:    Talks on phone: Not on  file    Gets together: Not on file    Attends religious service: Not on file    Active member of club or organization: Not on file    Attends meetings of clubs or organizations: Not on file    Relationship status: Not on file  . Intimate partner violence:    Fear of current or ex partner: Not on file    Emotionally abused: Not on file    Physically abused: Not on file    Forced sexual activity: Not on file  Other Topics Concern  . Not on file  Social History Narrative  . Not on file   Family History  Problem Relation Age of Onset  . Heart attack Father   . Cancer Mother        lung  . Cancer Cousin        breast      VITAL SIGNS BP 120/71   Pulse 68   Temp 98 F (36.7 C) (Oral)   Resp 20   Ht 5\' 11"  (1.803 m)   Wt 201 lb 1.6 oz (91.2 kg)   BMI 28.05 kg/m   Outpatient Encounter Medications as of 08/02/2018  Medication Sig  . acetaminophen (TYLENOL) 325 MG tablet Take 2 tablets (650 mg total) by mouth every 6 (six) hours as needed for mild pain (or temp > 100).  [name] apixaban (ELIQUIS) 5 MG TABS tablet Take 5 mg by mouth 2 (two) times daily.  [name] atorvastatin (LIPITOR) 20 MG tablet Take 20 mg by mouth daily.   . ciprofloxacin (CIPRO) 500 MG tablet Take 1 tablet (500 mg total) by mouth 2 (two) times daily.  . clonazePAM (KLONOPIN) 1 MG tablet Take 1 tablet (1 mg total) by mouth 3 (three) times daily. 8am, 2pm, 8pm  . LACTOBACILLUS PO Take 1 tablet by mouth 2 (two) times a day. X 2 weeks  . metoprolol tartrate (LOPRESSOR) 25 MG tablet Take 25 mg by mouth daily.  . Multiple Vitamins-Minerals (PRESERVISION AREDS 2) CAPS Take 1 capsule by mouth 2 (two) times daily.  . nitroGLYCERIN (NITROSTAT) 0.4 MG SL tablet Place 1 tablet (0.4 mg total) under the tongue every 5 (five) minutes x 3 doses as needed for chest pain.  . NON FORMULARY Diet Type:  NAS  . oxyCODONE-acetaminophen (PERCOCET/ROXICET) 5-325 MG tablet Take 1 tablet by mouth every 8 (eight) hours as needed for up to 5 days  for severe pain.   No facility-administered encounter medications on file as of 08/02/2018.      SIGNIFICANT DIAGNOSTIC EXAMS  PREVIOUS:   07-29-18: chest x-ray:  1. No acute cardiopulmonary disease. 2. Stable post treatment  changes in the upper left hemithorax. 3. Stable irregular nodular opacities in the mid and upper right lung, please see recent chest CT report 07/10/2018 for details.  NO NEW EXAMS.   LABS REVIEWED PREVIOUS:   07-26-18 wbc 13.8; hgb 13.9; hct 41.8; mcv 89.5 ;plt 139; glucose 143; bun 16; creat 0.93; k+ 3.9; na++ 138; ca 8.6 tsh 0.408 07-29-18: wbc 18.6; hgb 12.6; hct 38.7; mcv 90.0; plt 143  glucose 112; bun 23; creat 1.00; k+ 3.9; na++ 138; ca 8.6 urine culture: e-coli ESBL: cipro   Blood culture: enterobacteriaceae species  07-31-18: wbc 8.9; hgb 12.9; hct 38.5; mcv 89.3 ;plt 139; glucose 124; bun 29; creat 1.16; k+ 3.7; na++ 133; ca 8.2   NO NEW LABS.   Review of Systems  Constitutional: Negative for malaise/fatigue.  Respiratory: Negative for cough and shortness of breath.   Cardiovascular: Negative for chest pain, palpitations and leg swelling.  Gastrointestinal: Negative for abdominal pain, constipation and heartburn.       Improving incontinence of bowel  Genitourinary:       Improving urinary incontinence   Musculoskeletal: Negative for back pain, joint pain and myalgias.  Skin: Negative.   Neurological: Positive for weakness. Negative for dizziness.       Bilateral lower extremity weakness and numbness   Psychiatric/Behavioral: The patient is not nervous/anxious.     Physical Exam Constitutional:      General: He is not in acute distress.    Appearance: He is well-developed. He is not diaphoretic.  Neck:     Musculoskeletal: Neck supple.     Thyroid: No thyromegaly.  Cardiovascular:     Rate and Rhythm: Normal rate and regular rhythm.     Pulses: Normal pulses.     Heart sounds: Normal heart sounds.     Comments: Psychologist, forensic present   Pulmonary:     Effort: Pulmonary effort is normal. No respiratory distress.     Breath sounds: Normal breath sounds.     Comments: History of left upper lobectomy and right upper lobe wedge resection  Abdominal:     General: Bowel sounds are normal. There is no distension.     Palpations: Abdomen is soft.     Tenderness: There is no abdominal tenderness.  Musculoskeletal:     Right lower leg: Edema present.     Left lower leg: Edema present.     Comments: Trace bilateral lower extremities Is able to move all extremities Has bilateral lower extremity weakness    Lymphadenopathy:     Cervical: No cervical adenopathy.  Skin:    General: Skin is warm and dry.     Comments: Incision line without signs of infection    Neurological:     Mental Status: He is alert and oriented to person, place, and time.  Psychiatric:        Mood and Affect: Mood normal.       ASSESSMENT/ PLAN:  TODAY:   1. Cauda equina syndrome 2. UTI due to extended spectrum beta lactamase (ESBL) producing escherichia coli  Will continue therapy as directed Will continue current medications Will need 3:1 and shower chair upon discharge with home health Will continue  To monitor his status.       MD is aware of resident's narcotic use and is in agreement with current plan of care. We will attempt to wean resident as apropriate   Ok Edwards NP [facility]  Contact [phone] Monday through Friday 8am- 5pm  After hours  call [phone]

## 2018-08-03 LAB — CULTURE, BLOOD (ROUTINE X 2)
Culture: NO GROWTH
Special Requests: ADEQUATE

## 2018-08-04 ENCOUNTER — Encounter (HOSPITAL_COMMUNITY): Payer: Self-pay | Admitting: *Deleted

## 2018-08-04 ENCOUNTER — Inpatient Hospital Stay
Admission: RE | Admit: 2018-08-04 | Discharge: 2018-08-08 | Disposition: A | Discharge status: Other Acute Inpatient Hospital | Payer: Medicare Other | Source: Ambulatory Visit | Attending: Internal Medicine | Admitting: Internal Medicine

## 2018-08-04 ENCOUNTER — Emergency Department (HOSPITAL_COMMUNITY)
Admission: EM | Admit: 2018-08-04 | Discharge: 2018-08-04 | Disposition: A | Discharge status: Home / Self Care | Payer: Medicare Other | Attending: Emergency Medicine | Admitting: Emergency Medicine

## 2018-08-04 DIAGNOSIS — I2510 Atherosclerotic heart disease of native coronary artery without angina pectoris: Secondary | ICD-10-CM | POA: Insufficient documentation

## 2018-08-04 DIAGNOSIS — Z79899 Other long term (current) drug therapy: Secondary | ICD-10-CM | POA: Insufficient documentation

## 2018-08-04 DIAGNOSIS — R339 Retention of urine, unspecified: Secondary | ICD-10-CM

## 2018-08-04 LAB — URINALYSIS, ROUTINE W REFLEX MICROSCOPIC
Bacteria, UA: NONE SEEN
Bilirubin Urine: NEGATIVE
Glucose, UA: NEGATIVE mg/dL
Ketones, ur: NEGATIVE mg/dL
Leukocytes,Ua: NEGATIVE
Nitrite: NEGATIVE
Protein, ur: NEGATIVE mg/dL
Specific Gravity, Urine: 1.017 (ref 1.005–1.030)
pH: 6 (ref 5.0–8.0)

## 2018-08-04 MED ORDER — TAMSULOSIN HCL 0.4 MG PO CAPS: 0.4000 mg | ORAL_CAPSULE | Freq: Every day | ORAL | 0 refills | Status: AC

## 2018-08-04 MED ORDER — TAMSULOSIN HCL 0.4 MG PO CAPS: 0.4000 mg | ORAL_CAPSULE | Freq: Every day | ORAL | 0 refills | Status: DC

## 2018-08-04 MED ORDER — TAMSULOSIN HCL 0.4 MG PO CAPS: 0.4000 mg | ORAL_CAPSULE | Freq: Every day | ORAL | Status: DC

## 2018-08-04 NOTE — ED Triage Notes (Signed)
Patient arrived from [place] [facility], staff notified this unit patient has urinary rentention of nearly 1077ml by their bladder scanner.  Attempt twice for foley catheter with no success.

## 2018-08-04 NOTE — ED Provider Notes (Signed)
[facility] EMERGENCY DEPARTMENT Provider Note   CSN: [id number] Arrival date & time: 08/04/18  1329    History   Chief Complaint Chief Complaint  Patient presents with  . Urinary Retention    HPI Benjamin [name] is a 73 y.o. male.     HPI  73 year old male, he presents to the hospital today with a complaint of urinary retention.  The patient had recently been admitted to the hospital after having surgery on his lumbar spine.  This surgery was performed on May 7 as the patient was having some signs of neurologic difficulties with his legs including weakness and some difficulty urinating.  He has since been at the nursing facility doing rehabilitation however was unable to urinate this morning and had an abdominal fullness, the staff at the facility could not pass a urinary catheter and thus they sent him to the hospital for this retention.  The patient states that on arrival a Foley catheter was placed and he is now completely symptom-free and feeling great.  He denies any increase in weakness or numbness of his legs, has no other complaints, his back pain is well controlled.  He has not had any fevers or chills or abdominal discomfort at this time.  Past Medical History:  Diagnosis Date  . Adenopathy    RIGHT ADRENAL  . Anxiety   . Arthritis    KNEES/HANDS  . CAD (coronary artery disease)    a. Prior known CTO of LCX with recanalizationby cath 2008;  b. 09/2012 NSTEMI/Cath/PCI: LM nl, LAD 35m, D1 50p, LCX 100p (2.75x16 Promus Premier DES), 80-97m, OM1 30, RCA 44m (3.0x16 Promus Premier DES), EF 50-55%.  [name] History of echocardiogram    Echo 2/18: Moderate LVH, EF 55-60, normal wall motion, trivial AI, moderate LAE, mild RVE, mild RAE, mildly elevated pulmonary pressure  . Hx of melanoma of skin   . 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 Non-small cell lung cancer ([place])    a. s/p resection and XRT.  [name] PAF (paroxysmal atrial fibrillation) ([place])    a. not currently on anticoagulation  (09/2012)  . Presence of permanent cardiac pacemaker   . 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 Uses walker     Patient Active Problem List   Diagnosis Date Noted  . UTI due to extended-spectrum beta lactamase (ESBL) producing Escherichia coli 08/01/2018  . Cauda equina syndrome ([place]) 07/25/2018  . Bilateral lower extremity edema 07/25/2018  . Incarcerated inguinal hernia 05/24/2018  . Complete heart block ([place]) 08/29/2016  . 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 Pneumothorax after biopsy 08/24/2015  . Hyperlipidemia 09/14/2014  . Mobitz type 1 second degree atrioventricular block 10/17/2012  . Hx of NSTEMI tx with DES to RCA and DES to LCx in 2014 10/16/2012  . Chest pain, atypical 07/02/2012  . Malignant neoplasm of hilus of lung ([place])   . Adenopathy   . Anxiety   . Arthritis   . Hx of melanoma of skin   . Permanent atrial fibrillation   . CAD (coronary artery disease)     Past Surgical History:  Procedure Laterality Date  . ADRENALEXTOMY  02/2000  . CARDIAC CATHETERIZATION  2008   LAD 40, D1 OK, D2 50, CFX 90, RCA OK, EF 60%; Consider PCI PRN but the stenosis is quite complex and we would end up jailing several large posterolateral branches    . HERNIA REPAIR  05/27/2018  .  INGUINAL HERNIA REPAIR Right 05/27/2018   Procedure: INCARCERATED RIGHT INGUINAL HERNIA REPAIR;  Surgeon: Erroll Luna, MD;  Location: [place];  Service: General;  Laterality: Right;  . LEFT HEART CATHETERIZATION WITH CORONARY ANGIOGRAM N/A 10/17/2012   Procedure: LEFT HEART CATHETERIZATION WITH CORONARY ANGIOGRAM;  Surgeon: Sherren Mocha, MD;  Location: [facility] CATH LAB;  Service: Cardiovascular;  Laterality: N/A;  . LOBECTOMY LEFT UPPER LOBE  08/15/2001  . LUMBAR LAMINECTOMY/DECOMPRESSION MICRODISCECTOMY N/A 07/25/2018   Procedure: Lumbar Four-Five Laminectomy;  Surgeon: Kristeen Miss, MD;  Location: [place];  Service: Neurosurgery;  Laterality: N/A;   Lumbar Four-Five Laminectomy  . PACEMAKER IMPLANT N/A 08/29/2016   Procedure: Pacemaker Implant;  Surgeon: Evans Lance, MD;  Location: [place] CV LAB;  Service: Cardiovascular;  Laterality: N/A;  . Right VATS, mini thoracotomy, wedge resection of right upper  01/18/2004        Home Medications    Prior to Admission medications   Medication Sig Start Date End Date Taking? Authorizing Provider  acetaminophen (TYLENOL) 325 MG tablet Take 2 tablets (650 mg total) by mouth every 6 (six) hours as needed for mild pain (or temp > 100). 05/28/18  Yes [place], Floyce Stakes, PA-C  apixaban (ELIQUIS) 5 MG TABS tablet Take 5 mg by mouth 2 (two) times daily. 07/31/18  Yes [provider]  atorvastatin (LIPITOR) 20 MG tablet Take 20 mg by mouth daily.    Yes [provider]  ciprofloxacin (CIPRO) 500 MG tablet Take 1 tablet (500 mg total) by mouth 2 (two) times daily. 07/31/18  Yes Kristeen Miss, MD  clonazePAM (KLONOPIN) 1 MG tablet Take 1 tablet (1 mg total) by mouth 3 (three) times daily. 8am, 2pm, 8pm 08/01/18  Yes Gerlene Fee, NP  LACTOBACILLUS PO Take 1 tablet by mouth 2 (two) times a day. X 2 weeks 07/31/18 08/13/18 Yes [provider]  metoprolol tartrate (LOPRESSOR) 25 MG tablet Take 25 mg by mouth daily. 07/31/18  Yes [provider]  Multiple Vitamins-Minerals (PRESERVISION AREDS 2) CAPS Take 1 capsule by mouth 2 (two) times daily.   Yes [provider]  nitroGLYCERIN (NITROSTAT) 0.4 MG SL tablet Place 1 tablet (0.4 mg total) under the tongue every 5 (five) minutes x 3 doses as needed for chest pain. 10/18/12  Yes Theora Gianotti, NP  NON FORMULARY Diet Type:  NAS 07/31/18  Yes [provider]  oxyCODONE-acetaminophen (PERCOCET/ROXICET) 5-325 MG tablet Take 1 tablet by mouth every 8 (eight) hours as needed for up to 5 days for severe pain. 08/01/18 08/06/18 Yes Gerlene Fee, NP  tamsulosin (FLOMAX) 0.4 MG CAPS capsule Take 1 capsule  (0.4 mg total) by mouth daily for 14 days. 08/04/18 08/18/18  Noemi Chapel, MD    Family History Family History  Problem Relation Age of Onset  . Heart attack Father   . Cancer Mother        lung  . Cancer Cousin        breast    Social History Social History   Tobacco Use  . Smoking status: Former Smoker    Packs/day: 1.00    Years: 40.00    Pack years: 40.00    Types: Cigarettes    Last attempt to quit: 08/15/2001    Years since quitting: 16.9  . Smokeless tobacco: Never Used  Substance Use Topics  . Alcohol use: No  . Drug use: No     Allergies   Codeine   Review of Systems Review of  Systems  All other systems reviewed and are negative.    Physical Exam Updated Vital Signs BP (!) 141/87 (BP Location: Right Arm)   Pulse 62   Temp 97.8 F (36.6 C) (Oral)   Resp 18   Ht 1.803 m (5\' 11" )   Wt 87.5 kg   SpO2 100%   BMI 26.92 kg/m   Physical Exam Vitals signs and nursing note reviewed.  Constitutional:      General: He is not in acute distress.    Appearance: He is well-developed.  HENT:     Head: Normocephalic and atraumatic.     Mouth/Throat:     Pharynx: No oropharyngeal exudate.  Eyes:     General: No scleral icterus.       Right eye: No discharge.        Left eye: No discharge.     Conjunctiva/sclera: Conjunctivae normal.     Pupils: Pupils are equal, round, and reactive to light.  Neck:     Musculoskeletal: Normal range of motion and neck supple.     Thyroid: No thyromegaly.     Vascular: No JVD.  Cardiovascular:     Rate and Rhythm: Normal rate and regular rhythm.     Heart sounds: Normal heart sounds. No murmur. No friction rub. No gallop.   Pulmonary:     Effort: Pulmonary effort is normal. No respiratory distress.     Breath sounds: Normal breath sounds. No wheezing or rales.  Abdominal:     General: Bowel sounds are normal. There is no distension.     Palpations: Abdomen is soft. There is no mass.     Tenderness: There is no  abdominal tenderness.     Comments: Totally soft and nontender abdomen.  There is no masses, no pulsating masses, no tenderness, inguinal hernia repair on the right scar intact, clean and dry  Genitourinary:    Comments: Normal-appearing penis scrotum and testicles, Foley catheter well-seated in the penile urethra Musculoskeletal: Normal range of motion.        General: No tenderness.  Lymphadenopathy:     Cervical: No cervical adenopathy.  Skin:    General: Skin is warm and dry.     Findings: No erythema or rash.  Neurological:     Mental Status: He is alert.     Coordination: Coordination normal.     Comments: The patient speech is normal, upper extremities with normal strength and sensation, lower extremities are slightly weak bilaterally, the patient states this is the same as it has been ever since before surgery.  Psychiatric:        Behavior: Behavior normal.      ED Treatments / Results  Labs (all labs ordered are listed, but only abnormal results are displayed) Labs Reviewed  URINALYSIS, ROUTINE W REFLEX MICROSCOPIC - Abnormal; Notable for the following components:      Result Value   Hgb urine dipstick SMALL (*)    All other components within normal limits  URINE CULTURE    EKG None  Radiology No results found.  Procedures Procedures (including critical care time)  Medications Ordered in ED Medications - No data to display   Initial Impression / Assessment and Plan / ED Course  I have reviewed the triage vital signs and the nursing notes.  Pertinent labs & imaging results that were available during my care of the patient were reviewed by me and considered in my medical decision making (see chart for details).  The patient symptoms seem to be related to urinary retention.  Given recent surgery and likely Foley catheter during procedure will check for urine infection however this may just be related to medications or prostatic hypertrophy.  May need  to start on Flomax if not already taking.  Urinalysis ordered.  uA clean,  Can f/u with Urology outpt Start flomax  Final Clinical Impressions(s) / ED Diagnoses   Final diagnoses:  Urinary retention    ED Discharge Orders         Ordered    tamsulosin (FLOMAX) 0.4 MG CAPS capsule  Daily     08/04/18 1445           Noemi Chapel, MD 08/04/18 1447

## 2018-08-04 NOTE — ED Notes (Signed)
Report given to [facility] staff

## 2018-08-04 NOTE — Discharge Instructions (Signed)
Please start taking Flomax 1 capsule daily and follow-up with the urologist within 1 week.  At that point the catheter may be able to be removed.  If you should develop fevers, increasing pain or if the catheter is not working in the nursing staff cannot get it to work you should come back to the hospital for recheck.

## 2018-08-04 NOTE — ED Notes (Signed)
950 ML emptied from foley bag

## 2018-08-05 ENCOUNTER — Non-Acute Institutional Stay (SKILLED_NURSING_FACILITY): Payer: Medicare Other | Admitting: Internal Medicine

## 2018-08-05 ENCOUNTER — Encounter: Payer: Self-pay | Admitting: Internal Medicine

## 2018-08-05 ENCOUNTER — Ambulatory Visit: Payer: Medicare Other | Admitting: Cardiovascular Disease

## 2018-08-05 DIAGNOSIS — C3400 Malignant neoplasm of unspecified main bronchus: Secondary | ICD-10-CM | POA: Diagnosis not present

## 2018-08-05 DIAGNOSIS — A498 Other bacterial infections of unspecified site: Secondary | ICD-10-CM | POA: Diagnosis not present

## 2018-08-05 DIAGNOSIS — F419 Anxiety disorder, unspecified: Secondary | ICD-10-CM

## 2018-08-05 DIAGNOSIS — I442 Atrioventricular block, complete: Secondary | ICD-10-CM

## 2018-08-05 DIAGNOSIS — I4821 Permanent atrial fibrillation: Secondary | ICD-10-CM

## 2018-08-05 DIAGNOSIS — R339 Retention of urine, unspecified: Secondary | ICD-10-CM

## 2018-08-05 DIAGNOSIS — E785 Hyperlipidemia, unspecified: Secondary | ICD-10-CM

## 2018-08-05 DIAGNOSIS — G834 Cauda equina syndrome: Secondary | ICD-10-CM

## 2018-08-05 LAB — URINE CULTURE: Culture: NO GROWTH

## 2018-08-05 NOTE — Progress Notes (Signed)
Provider:  Veleta Miners, MD Location:  [place] Room Number: 160 P Place of Service:  SNF (31)  PCP: Lajean Manes, MD Patient Care Team: Lajean Manes, MD as PCP - General (Internal Medicine) Nahser, Wonda Cheng, MD as PCP - Cardiology (Cardiology) Evans Lance, MD as PCP - Electrophysiology (Cardiology) Nicanor Alcon, MD (Thoracic Surgery) Tyler Pita, MD (Radiation Oncology) Lajean Manes, MD (Internal Medicine)  Extended Emergency Contact Information Primary Emergency Contact: Boggess,Ronnie Address: [address], [place] Phone: [phone] Mobile Phone: [phone] Relation: Brother  Code Status: Full Code Goals of Care: Advanced Directive information Advanced Directives 08/05/2018  Does Patient Have a Medical Advance Directive? -  Type of Advance Directive -  Does patient want to make changes to medical advance directive? No - Patient declined  Would patient like information on creating a medical advance directive? No - Patient declined  Pre-existing out of facility DNR order (yellow form or pink MOST form) -      Chief Complaint  Patient presents with  . New Admit To SNF    Admission    HPI: Patient is a 73 y.o. male seen today for admission to [facility] for therapy He was in the hospital from 05/07-05/13 for Laminectomy for Cauda Equina Syndrome and ESBL Bacteremia  Patient has history of CAD status post DES, non-small cell lung cancer with multiple recurrences s/p lobectomy and radiation therapy, A. fib on Eliquis, complete heart block with PPM, Hyperlipidemia and anxiety Patient Was having issues with low back pain and inability to walk for last few weeks.  He found that even doing the daily activities was getting hard for him as he could not stand.  Eventually he also started having bladder and bowel issues.  He was seen by his PCP who had a CT scan done which showed that he had a severe  subarticular stenosis for L5 nerve root in addition to bilateral L4 nerve root stenosis. Went laminectomy L4-L5 on  The left lower extremity swelling secondary to acute diastolic CHF he was treated with Lasix and he responded and improved. Back few days postop he developed high-grade fever and his Blood culture grew ESBL E. coli  His urine was also positive for that.  Really switched to oral Cipro. He is now in SNF for therapy. He has not had any more fever or chills.  But over the weekend patient developed urinary retention and was sent to ED as the nurses were unable to put Foley catheter. They got 950 cc of urine in ED and he was started on Flomax Independent before the surgery.  Does not have any kids but has a younger brother who helps him Past Medical History:  Diagnosis Date  . Adenopathy    RIGHT ADRENAL  . Anxiety   . Arthritis    KNEES/HANDS  . CAD (coronary artery disease)    a. Prior known CTO of LCX with recanalizationby cath 2008;  b. 09/2012 NSTEMI/Cath/PCI: LM nl, LAD 10m, D1 50p, LCX 100p (2.75x16 Promus Premier DES), 80-63m, OM1 30, RCA 90m (3.0x16 Promus Premier DES), EF 50-55%.  [name] History of echocardiogram    Echo 2/18: Moderate LVH, EF 55-60, normal wall motion, trivial AI, moderate LAE, mild RVE, mild RAE, mildly elevated pulmonary pressure  . Hx of melanoma of skin   . 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 Non-small cell lung cancer ([place])  a. s/p resection and XRT.  [name] PAF (paroxysmal atrial fibrillation) ([place])    a. not currently on anticoagulation (09/2012)  . Presence of permanent cardiac pacemaker   . 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 Uses walker    Past Surgical History:  Procedure Laterality Date  . ADRENALEXTOMY  02/2000  . CARDIAC CATHETERIZATION  2008   LAD 40, D1 OK, D2 50, CFX 90, RCA OK, EF 60%; Consider PCI PRN but the stenosis is quite complex and we would end up jailing several large posterolateral branches    . HERNIA  REPAIR  05/27/2018  . INGUINAL HERNIA REPAIR Right 05/27/2018   Procedure: INCARCERATED RIGHT INGUINAL HERNIA REPAIR;  Surgeon: Erroll Luna, MD;  Location: [place];  Service: General;  Laterality: Right;  . LEFT HEART CATHETERIZATION WITH CORONARY ANGIOGRAM N/A 10/17/2012   Procedure: LEFT HEART CATHETERIZATION WITH CORONARY ANGIOGRAM;  Surgeon: Sherren Mocha, MD;  Location: [facility] CATH LAB;  Service: Cardiovascular;  Laterality: N/A;  . LOBECTOMY LEFT UPPER LOBE  08/15/2001  . LUMBAR LAMINECTOMY/DECOMPRESSION MICRODISCECTOMY N/A 07/25/2018   Procedure: Lumbar Four-Five Laminectomy;  Surgeon: Kristeen Miss, MD;  Location: [place];  Service: Neurosurgery;  Laterality: N/A;  Lumbar Four-Five Laminectomy  . PACEMAKER IMPLANT N/A 08/29/2016   Procedure: Pacemaker Implant;  Surgeon: Evans Lance, MD;  Location: [place] CV LAB;  Service: Cardiovascular;  Laterality: N/A;  . Right VATS, mini thoracotomy, wedge resection of right upper  01/18/2004    reports that he quit smoking about 16 years ago. His smoking use included cigarettes. He has a 40.00 pack-year smoking history. He has never used smokeless tobacco. He reports that he does not drink alcohol or use drugs. Social History   Socioeconomic History  . Marital status: Divorced    Spouse name: Not on file  . Number of children: Not on file  . Years of education: Not on file  . Highest education level: Not on file  Occupational History  . Occupation: Retired    Fish farm manager: [employer]  . Financial resource strain: Not on file  . Food insecurity:    Worry: Not on file    Inability: Not on file  . Transportation needs:    Medical: Not on file    Non-medical: Not on file  Tobacco Use  . Smoking status: Former Smoker    Packs/day: 1.00    Years: 40.00    Pack years: 40.00    Types: Cigarettes    Last attempt to quit: 08/15/2001    Years since quitting: 16.9  . Smokeless tobacco: Never Used  Substance and Sexual  Activity  . Alcohol use: No  . Drug use: No  . Sexual activity: Not Currently  Lifestyle  . Physical activity:    Days per week: Not on file    Minutes per session: Not on file  . Stress: Not on file  Relationships  . Social connections:    Talks on phone: Not on file    Gets together: Not on file    Attends religious service: Not on file    Active member of club or organization: Not on file    Attends meetings of clubs or organizations: Not on file    Relationship status: Not on file  . Intimate partner violence:    Fear of current or ex partner: Not on file    Emotionally abused: Not on file    Physically abused: Not on file  Forced sexual activity: Not on file  Other Topics Concern  . Not on file  Social History Narrative  . Not on file    Functional Status Survey:    Family History  Problem Relation Age of Onset  . Heart attack Father   . Cancer Mother        lung  . Cancer Cousin        breast    Health Maintenance  Topic Date Due  . COLONOSCOPY  09/01/2018 (Originally 04/21/1993)  . Hepatitis C Screening  09/02/2018 (Originally Dec 11, 1945)  . INFLUENZA VACCINE  10/19/2018  . TETANUS/TDAP  08/16/2023  . PNA vac Low Risk Adult  Completed    Allergies  Allergen Reactions  . Codeine Other (See Comments)    Dizziness, stomach pain    Outpatient Encounter Medications as of 08/05/2018  Medication Sig  . acetaminophen (TYLENOL) 325 MG tablet Take 2 tablets (650 mg total) by mouth every 6 (six) hours as needed for mild pain (or temp > 100).  [name] apixaban (ELIQUIS) 5 MG TABS tablet Take 5 mg by mouth 2 (two) times daily.  [name] atorvastatin (LIPITOR) 20 MG tablet Take 20 mg by mouth daily.   . clonazePAM (KLONOPIN) 1 MG tablet Take 1 tablet (1 mg total) by mouth 3 (three) times daily. 8am, 2pm, 8pm  . LACTOBACILLUS PO Take 1 tablet by mouth 2 (two) times a day. X 2 weeks  . metoprolol tartrate (LOPRESSOR) 25 MG tablet Take 25 mg by mouth daily.  . nitroGLYCERIN  (NITROSTAT) 0.4 MG SL tablet Place 1 tablet (0.4 mg total) under the tongue every 5 (five) minutes x 3 doses as needed for chest pain.  . NON FORMULARY Diet Type:  NAS  . oxyCODONE-acetaminophen (PERCOCET/ROXICET) 5-325 MG tablet Take 1 tablet by mouth every 8 (eight) hours as needed for up to 5 days for severe pain.  . tamsulosin (FLOMAX) 0.4 MG CAPS capsule Take 1 capsule (0.4 mg total) by mouth daily for 14 days.  . [DISCONTINUED] ciprofloxacin (CIPRO) 500 MG tablet Take 1 tablet (500 mg total) by mouth 2 (two) times daily. (Patient not taking: Reported on 08/05/2018)  . [DISCONTINUED] Multiple Vitamins-Minerals (PRESERVISION AREDS 2) CAPS Take 1 capsule by mouth 2 (two) times daily.   No facility-administered encounter medications on file as of 08/05/2018.     Review of Systems  Constitutional: Negative.   HENT: Negative.   Respiratory: Negative.   Cardiovascular: Negative.   Gastrointestinal: Negative.   Genitourinary: Positive for difficulty urinating.  Musculoskeletal: Positive for back pain.  Skin: Negative.   Neurological: Positive for weakness.  Psychiatric/Behavioral: Negative.     Vitals:   08/05/18 1033  BP: 115/67  Pulse: 76  Resp: 20  Temp: (!) 97.2 F (36.2 C)  Weight: 201 lb 1.6 oz (91.2 kg)  Height: 5\' 11"  (1.803 m)   Body mass index is 28.05 kg/m. Physical Exam Constitutional:      Appearance: Normal appearance.  HENT:     Head: Normocephalic.     Nose: Nose normal.     Mouth/Throat:     Mouth: Mucous membranes are moist.     Pharynx: Oropharynx is clear.  Eyes:     Pupils: Pupils are equal, round, and reactive to light.  Neck:     Musculoskeletal: Neck supple.  Cardiovascular:     Rate and Rhythm: Normal rate and regular rhythm.     Pulses: Normal pulses.     Heart sounds: Normal heart sounds. No murmur.  Pulmonary:     Effort: Pulmonary effort is normal. No respiratory distress.     Breath sounds: Normal breath sounds. No wheezing or rales.   Abdominal:     General: Abdomen is flat. Bowel sounds are normal.     Palpations: Abdomen is soft.  Musculoskeletal:        General: No swelling.  Skin:    General: Skin is warm and dry.  Neurological:     Mental Status: He is alert and oriented to person, place, and time.     Comments: Has 4/5 Strength in Bilateral LE  Psychiatric:        Mood and Affect: Mood normal.        Thought Content: Thought content normal.        Judgment: Judgment normal.     Labs reviewed: Basic Metabolic Panel: Recent Labs    07/29/18 0429 07/30/18 0858 07/31/18 0213  NA 138 136 133*  K 3.9 4.2 3.7  CL 97* 100 97*  CO2 29 27 27   GLUCOSE 112* 145* 124*  BUN 23 24* 29*  CREATININE 1.00 1.18 1.16  CALCIUM 8.6* 8.5* 8.2*   Liver Function Tests: Recent Labs    03/05/18 1427 05/24/18 1746 05/25/18 0024  AST 13* 26 16  ALT 13 12 14   ALKPHOS 73 82 61  BILITOT 1.0 1.5* 0.9  PROT 6.6 7.5 5.9*  ALBUMIN 3.9 4.4 3.5   Recent Labs    05/24/18 1746  LIPASE 27   No results for input(s): AMMONIA in the last 8760 hours. CBC: Recent Labs    05/24/18 1746  05/26/18 0454  07/29/18 1246 07/30/18 0858 07/31/18 0213  WBC 10.4   < > 6.3   < > 18.6* 17.2* 8.9  NEUTROABS 9.3*  --  4.3  --  17.0*  --   --   HGB 16.2   < > 13.5   < > 12.6* 13.8 12.9*  HCT 49.5   < > 41.8   < > 38.7* 41.9 38.5*  MCV 90.8   < > 91.7   < > 90.0 89.9 89.3  PLT 161   < > 123*   < > 143* 148* 139*   < > = values in this interval not displayed.   Cardiac Enzymes: No results for input(s): CKTOTAL, CKMB, CKMBINDEX, TROPONINI in the last 8760 hours. BNP: Invalid input(s): POCBNP Lab Results  Component Value Date   HGBA1C 5.5 10/16/2012   Lab Results  Component Value Date   TSH 0.408 07/26/2018   No results found for: VITAMINB12 No results found for: FOLATE No results found for: IRON, TIBC, FERRITIN  Imaging and Procedures obtained prior to SNF admission: No results found.  Assessment/Plan Infection due  to extended spectrum beta lactamase (ESBL) producing Enterobacteriaceae bacterium Continue on Cipro for total of 10 days Afebrile White Count back to normal Cauda equina syndrome (HCC) S/P Laminectomy Pain seems to be better on Percocet Working with therapy Able to do transfers  Permanent atrial fibrillation Rate control on Lopressor On Eliquis  Complete heart block (HCC) S/P Pacemaker  Malignant neoplasm of hilus of lung, S/P Radiation therapy Follow[facility]  Hyperlipidemia, On Statin  Anxiety  Continue Klonopin  Urinary retention S/P Foley Cathter On Flomax Will do Voiding trial in few days If fails then chronic Catheter and Follow up with Urology     Family/ staff Communication:   Labs/tests ordered: Total time spent in this patient care encounter was  45_  minutes; greater than 50% of the visit spent counseling patient and staff, reviewing records , Labs and coordinating care for problems addressed at this encounter.

## 2018-08-06 ENCOUNTER — Non-Acute Institutional Stay (SKILLED_NURSING_FACILITY): Payer: Medicare Other | Admitting: Adult Health

## 2018-08-06 ENCOUNTER — Encounter: Payer: Self-pay | Admitting: Adult Health

## 2018-08-06 DIAGNOSIS — R600 Localized edema: Secondary | ICD-10-CM | POA: Diagnosis not present

## 2018-08-06 DIAGNOSIS — G834 Cauda equina syndrome: Secondary | ICD-10-CM

## 2018-08-06 DIAGNOSIS — J3089 Other allergic rhinitis: Secondary | ICD-10-CM | POA: Diagnosis not present

## 2018-08-06 NOTE — Progress Notes (Signed)
Location:   [place] Room Number: 160 P Place of Service:  SNF (31)   CODE STATUS: Full Code  Allergies  Allergen Reactions  . Codeine Other (See Comments)    Dizziness, stomach pain    Chief Complaint  Patient presents with  . Acute Visit    Pain Management    HPI:  His pain is well managed; he is wanting to stop the percocet as he is not using this medication. He does have some sinus congestion with clear sputum production; has a cough; no shortness of breath; no sore throat. He has lower extremity edema present with right worse than left. There are no reports of fevers.   Past Medical History:  Diagnosis Date  . Adenopathy    RIGHT ADRENAL  . Anxiety   . Arthritis    KNEES/HANDS  . CAD (coronary artery disease)    a. Prior known CTO of LCX with recanalizationby cath 2008;  b. 09/2012 NSTEMI/Cath/PCI: LM nl, LAD 13m, D1 50p, LCX 100p (2.75x16 Promus Premier DES), 80-47m, OM1 30, RCA 47m (3.0x16 Promus Premier DES), EF 50-55%.  [name] History of echocardiogram    Echo 2/18: Moderate LVH, EF 55-60, normal wall motion, trivial AI, moderate LAE, mild RVE, mild RAE, mildly elevated pulmonary pressure  . Hx of melanoma of skin   . 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 Non-small cell lung cancer ([place])    a. s/p resection and XRT.  [name] PAF (paroxysmal atrial fibrillation) ([place])    a. not currently on anticoagulation (09/2012)  . Presence of permanent cardiac pacemaker   . 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 Uses walker     Past Surgical History:  Procedure Laterality Date  . ADRENALEXTOMY  02/2000  . CARDIAC CATHETERIZATION  2008   LAD 40, D1 OK, D2 50, CFX 90, RCA OK, EF 60%; Consider PCI PRN but the stenosis is quite complex and we would end up jailing several large posterolateral branches    . HERNIA REPAIR  05/27/2018  . INGUINAL HERNIA REPAIR Right 05/27/2018   Procedure: INCARCERATED RIGHT INGUINAL HERNIA REPAIR;  Surgeon:  Erroll Luna, MD;  Location: [place];  Service: General;  Laterality: Right;  . LEFT HEART CATHETERIZATION WITH CORONARY ANGIOGRAM N/A 10/17/2012   Procedure: LEFT HEART CATHETERIZATION WITH CORONARY ANGIOGRAM;  Surgeon: Sherren Mocha, MD;  Location: [facility] CATH LAB;  Service: Cardiovascular;  Laterality: N/A;  . LOBECTOMY LEFT UPPER LOBE  08/15/2001  . LUMBAR LAMINECTOMY/DECOMPRESSION MICRODISCECTOMY N/A 07/25/2018   Procedure: Lumbar Four-Five Laminectomy;  Surgeon: Kristeen Miss, MD;  Location: [place];  Service: Neurosurgery;  Laterality: N/A;  Lumbar Four-Five Laminectomy  . PACEMAKER IMPLANT N/A 08/29/2016   Procedure: Pacemaker Implant;  Surgeon: Evans Lance, MD;  Location: [place] CV LAB;  Service: Cardiovascular;  Laterality: N/A;  . Right VATS, mini thoracotomy, wedge resection of right upper  01/18/2004    Social History   Socioeconomic History  . Marital status: Divorced    Spouse name: Not on file  . Number of children: Not on file  . Years of education: Not on file  . Highest education level: Not on file  Occupational History  . Occupation: Retired    Fish farm manager: [employer]  . Financial resource strain: Not on file  . Food insecurity:    Worry: Not on file    Inability: Not on file  . Transportation needs:    Medical: Not  on file    Non-medical: Not on file  Tobacco Use  . Smoking status: Former Smoker    Packs/day: 1.00    Years: 40.00    Pack years: 40.00    Types: Cigarettes    Last attempt to quit: 08/15/2001    Years since quitting: 16.9  . Smokeless tobacco: Never Used  Substance and Sexual Activity  . Alcohol use: No  . Drug use: No  . Sexual activity: Not Currently  Lifestyle  . Physical activity:    Days per week: Not on file    Minutes per session: Not on file  . Stress: Not on file  Relationships  . Social connections:    Talks on phone: Not on file    Gets together: Not on file    Attends religious service: Not on file     Active member of club or organization: Not on file    Attends meetings of clubs or organizations: Not on file    Relationship status: Not on file  . Intimate partner violence:    Fear of current or ex partner: Not on file    Emotionally abused: Not on file    Physically abused: Not on file    Forced sexual activity: Not on file  Other Topics Concern  . Not on file  Social History Narrative  . Not on file   Family History  Problem Relation Age of Onset  . Heart attack Father   . Cancer Mother        lung  . Cancer Cousin        breast      VITAL SIGNS BP (!) 161/92   Pulse 71   Temp 97.8 F (36.6 C)   Resp 20   Ht 5\' 11"  (1.803 m)   Wt 196 lb 9.6 oz (89.2 kg)   BMI 27.42 kg/m   Outpatient Encounter Medications as of 08/06/2018  Medication Sig  . acetaminophen (TYLENOL) 325 MG tablet Take 2 tablets (650 mg total) by mouth every 6 (six) hours as needed for mild pain (or temp > 100).  [name] apixaban (ELIQUIS) 5 MG TABS tablet Take 5 mg by mouth 2 (two) times daily.  [name] atorvastatin (LIPITOR) 20 MG tablet Take 20 mg by mouth daily.   . ciprofloxacin (CIPRO) 500 MG tablet Take 500 mg by mouth 2 (two) times daily.  . clonazePAM (KLONOPIN) 1 MG tablet Take 1 tablet (1 mg total) by mouth 3 (three) times daily. 8am, 2pm, 8pm  . LACTOBACILLUS PO Take 1 tablet by mouth 2 (two) times a day. X 2 weeks  . metoprolol tartrate (LOPRESSOR) 25 MG tablet Take 25 mg by mouth daily.  . nitroGLYCERIN (NITROSTAT) 0.4 MG SL tablet Place 1 tablet (0.4 mg total) under the tongue every 5 (five) minutes x 3 doses as needed for chest pain.  . NON FORMULARY Diet Type:  NAS  . oxyCODONE-acetaminophen (PERCOCET/ROXICET) 5-325 MG tablet Take 1 tablet by mouth every 8 (eight) hours as needed for up to 5 days for severe pain.  . tamsulosin (FLOMAX) 0.4 MG CAPS capsule Take 1 capsule (0.4 mg total) by mouth daily for 14 days.   No facility-administered encounter medications on file as of 08/06/2018.       SIGNIFICANT DIAGNOSTIC EXAMS  PREVIOUS:   07-29-18: chest x-ray:  1. No acute cardiopulmonary disease. 2. Stable post treatment changes in the upper left hemithorax. 3. Stable irregular nodular opacities in the mid and upper right lung, please  see recent chest CT report 07/10/2018 for details.  NO NEW EXAMS.   LABS REVIEWED PREVIOUS:   07-26-18 wbc 13.8; hgb 13.9; hct 41.8; mcv 89.5 ;plt 139; glucose 143; bun 16; creat 0.93; k+ 3.9; na++ 138; ca 8.6 tsh 0.408 07-29-18: wbc 18.6; hgb 12.6; hct 38.7; mcv 90.0; plt 143  glucose 112; bun 23; creat 1.00; k+ 3.9; na++ 138; ca 8.6 urine culture: e-coli ESBL: cipro   Blood culture: enterobacteriaceae species  07-31-18: wbc 8.9; hgb 12.9; hct 38.5; mcv 89.3 ;plt 139; glucose 124; bun 29; creat 1.16; k+ 3.7; na++ 133; ca 8.2   NO NEW LABS.   Review of Systems  Constitutional: Negative for malaise/fatigue.  HENT: Positive for congestion.   Respiratory: Positive for cough and sputum production. Negative for shortness of breath.   Cardiovascular: Positive for leg swelling. Negative for chest pain and palpitations.  Gastrointestinal: Negative for abdominal pain, constipation and heartburn.  Musculoskeletal: Negative for back pain, joint pain and myalgias.  Skin: Negative.   Neurological: Positive for weakness. Negative for dizziness.       Has bilateral lower extremity weakness present is getting better;  Numbness is getting better  Psychiatric/Behavioral: The patient is not nervous/anxious.     Physical Exam Constitutional:      General: He is not in acute distress.    Appearance: He is well-developed. He is not diaphoretic.  Neck:     Musculoskeletal: Neck supple.     Thyroid: No thyromegaly.  Cardiovascular:     Rate and Rhythm: Normal rate and regular rhythm.     Pulses: Normal pulses.     Heart sounds: Normal heart sounds.     Comments: Has pace maker Pulmonary:     Effort: Pulmonary effort is normal. No respiratory distress.      Breath sounds: Normal breath sounds.     Comments:  History of left upper lobectomy and right upper lobe wedge resection  Abdominal:     General: Bowel sounds are normal. There is no distension.     Palpations: Abdomen is soft.     Tenderness: There is no abdominal tenderness.  Musculoskeletal:     Right lower leg: Edema present.     Left lower leg: Edema present.     Comments: Trace to 1+  bilateral lower extremity edema R>L Is able to move all extremities Has bilateral lower extremity weakness     Lymphadenopathy:     Cervical: No cervical adenopathy.  Skin:    General: Skin is warm and dry.  Neurological:     Mental Status: He is alert and oriented to person, place, and time.  Psychiatric:        Mood and Affect: Mood normal.        ASSESSMENT/ PLAN:  TODAY:   1. Chronic nonseasonal allergic rhinitis 2. Cauda equina syndrome 3. Bilateral lower extremity edema  Will begin claritin 10 mg nightly  Will stop percocet Will begin knee high ted hose in the AM and off in the PM  Will monitor his status      MD is aware of resident's narcotic use and is in agreement with current plan of care. We will attempt to wean resident as apropriate   Ok Edwards NP [facility]  Contact [phone] Monday through Friday 8am- 5pm  After hours call [phone]

## 2018-08-07 ENCOUNTER — Non-Acute Institutional Stay (SKILLED_NURSING_FACILITY): Payer: Medicare Other | Admitting: Adult Health

## 2018-08-07 ENCOUNTER — Encounter: Payer: Self-pay | Admitting: Adult Health

## 2018-08-07 DIAGNOSIS — E785 Hyperlipidemia, unspecified: Secondary | ICD-10-CM

## 2018-08-07 DIAGNOSIS — N390 Urinary tract infection, site not specified: Secondary | ICD-10-CM | POA: Diagnosis not present

## 2018-08-07 DIAGNOSIS — G834 Cauda equina syndrome: Secondary | ICD-10-CM | POA: Diagnosis not present

## 2018-08-07 DIAGNOSIS — Z1612 Extended spectrum beta lactamase (ESBL) resistance: Secondary | ICD-10-CM

## 2018-08-07 DIAGNOSIS — B9629 Other Escherichia coli [E. coli] as the cause of diseases classified elsewhere: Secondary | ICD-10-CM | POA: Diagnosis not present

## 2018-08-07 NOTE — Progress Notes (Signed)
Location:   [facility] Room Number: 160 P Place of Service:  SNF (31)   CODE STATUS: Full Code  Allergies  Allergen Reactions  . Codeine Other (See Comments)    Dizziness, stomach pain    Chief Complaint  Patient presents with  . Medical Management of Chronic Issues    Cauda equina syndrome; UTI due to extended spectrum beta lactamase (ESBL) producing escherichia coli; hyperlipidemia unspecified hyperlipidemia type. Weekly follow up for the first 30 days post hospitalization     HPI:  He is a 73 year old long short term patient of this facility being seen for the management of his chronic illnesses: cauda equina syndrome; uti; hyperlipidemia. He does have sinus congestion does not want to take any medications at this time. He denies any uncontrolled pain. No changes in his appetite; no insomnia. There are no reports of fevers present.   Past Medical History:  Diagnosis Date  . Adenopathy    RIGHT ADRENAL  . Anxiety   . Arthritis    KNEES/HANDS  . CAD (coronary artery disease)    a. Prior known CTO of LCX with recanalizationby cath 2008;  b. 09/2012 NSTEMI/Cath/PCI: LM nl, LAD 20m, D1 50p, LCX 100p (2.75x16 Promus Premier DES), 80-73m, OM1 30, RCA 89m (3.0x16 Promus Premier DES), EF 50-55%.  [name] History of echocardiogram    Echo 2/18: Moderate LVH, EF 55-60, normal wall motion, trivial AI, moderate LAE, mild RVE, mild RAE, mildly elevated pulmonary pressure  . Hx of melanoma of skin   . 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 Non-small cell lung cancer ([place])    a. s/p resection and XRT.  [name] PAF (paroxysmal atrial fibrillation) ([place])    a. not currently on anticoagulation (09/2012)  . Presence of permanent cardiac pacemaker   . 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 Uses walker     Past Surgical History:  Procedure Laterality Date  . ADRENALEXTOMY  02/2000  . CARDIAC CATHETERIZATION  2008   LAD 40, D1 OK, D2 50, CFX 90, RCA OK,  EF 60%; Consider PCI PRN but the stenosis is quite complex and we would end up jailing several large posterolateral branches    . HERNIA REPAIR  05/27/2018  . INGUINAL HERNIA REPAIR Right 05/27/2018   Procedure: INCARCERATED RIGHT INGUINAL HERNIA REPAIR;  Surgeon: Erroll Luna, MD;  Location: [facility] [facility];  Service: General;  Laterality: Right;  . LEFT HEART CATHETERIZATION WITH CORONARY ANGIOGRAM N/A 10/17/2012   Procedure: LEFT HEART CATHETERIZATION WITH CORONARY ANGIOGRAM;  Surgeon: Sherren Mocha, MD;  Location: [facility] CATH LAB;  Service: Cardiovascular;  Laterality: N/A;  . LOBECTOMY LEFT UPPER LOBE  08/15/2001  . LUMBAR LAMINECTOMY/DECOMPRESSION MICRODISCECTOMY N/A 07/25/2018   Procedure: Lumbar Four-Five Laminectomy;  Surgeon: Kristeen Miss, MD;  Location: [facility];  Service: Neurosurgery;  Laterality: N/A;  Lumbar Four-Five Laminectomy  . PACEMAKER IMPLANT N/A 08/29/2016   Procedure: Pacemaker Implant;  Surgeon: Evans Lance, MD;  Location: [place] CV LAB;  Service: Cardiovascular;  Laterality: N/A;  . Right VATS, mini thoracotomy, wedge resection of right upper  01/18/2004    Social History   Socioeconomic History  . Marital status: Divorced    Spouse name: Not on file  . Number of children: Not on file  . Years of education: Not on file  . Highest education level: Not on file  Occupational History  . Occupation: Retired    Fish farm manager: [employer]  .  Financial resource strain: Not on file  . Food insecurity:    Worry: Not on file    Inability: Not on file  . Transportation needs:    Medical: Not on file    Non-medical: Not on file  Tobacco Use  . Smoking status: Former Smoker    Packs/day: 1.00    Years: 40.00    Pack years: 40.00    Types: Cigarettes    Last attempt to quit: 08/15/2001    Years since quitting: 16.9  . Smokeless tobacco: Never Used  Substance and Sexual Activity  . Alcohol use: No  . Drug use: No  . Sexual activity: Not Currently   Lifestyle  . Physical activity:    Days per week: Not on file    Minutes per session: Not on file  . Stress: Not on file  Relationships  . Social connections:    Talks on phone: Not on file    Gets together: Not on file    Attends religious service: Not on file    Active member of club or organization: Not on file    Attends meetings of clubs or organizations: Not on file    Relationship status: Not on file  . Intimate partner violence:    Fear of current or ex partner: Not on file    Emotionally abused: Not on file    Physically abused: Not on file    Forced sexual activity: Not on file  Other Topics Concern  . Not on file  Social History Narrative  . Not on file   Family History  Problem Relation Age of Onset  . Heart attack Father   . Cancer Mother        lung  . Cancer Cousin        breast      VITAL SIGNS BP 128/74   Pulse 74   Temp 98 F (36.7 C)   Resp 18   Ht 5\' 11"  (1.803 m)   Wt 196 lb 9.6 oz (89.2 kg)   BMI 27.42 kg/m   Outpatient Encounter Medications as of 08/07/2018  Medication Sig  . acetaminophen (TYLENOL) 325 MG tablet Take 2 tablets (650 mg total) by mouth every 6 (six) hours as needed for mild pain (or temp > 100).  [name] apixaban (ELIQUIS) 5 MG TABS tablet Take 5 mg by mouth 2 (two) times daily.  [name] atorvastatin (LIPITOR) 20 MG tablet Take 20 mg by mouth daily.   . ciprofloxacin (CIPRO) 500 MG tablet Take 500 mg by mouth 2 (two) times daily.  . clonazePAM (KLONOPIN) 1 MG tablet Take 1 tablet (1 mg total) by mouth 3 (three) times daily. 8am, 2pm, 8pm  . LACTOBACILLUS PO Take 1 tablet by mouth 2 (two) times a day. X 2 weeks  . loratadine (CLARITIN) 10 MG tablet Take 10 mg by mouth at bedtime.  . metoprolol tartrate (LOPRESSOR) 25 MG tablet Take 25 mg by mouth daily.  . nitroGLYCERIN (NITROSTAT) 0.4 MG SL tablet Place 1 tablet (0.4 mg total) under the tongue every 5 (five) minutes x 3 doses as needed for chest pain.  . NON FORMULARY Diet Type:  NAS   . tamsulosin (FLOMAX) 0.4 MG CAPS capsule Take 1 capsule (0.4 mg total) by mouth daily for 14 days.   No facility-administered encounter medications on file as of 08/07/2018.      SIGNIFICANT DIAGNOSTIC EXAMS  PREVIOUS:   07-29-18: chest x-ray:  1. No acute cardiopulmonary disease. 2. Stable post  treatment changes in the upper left hemithorax. 3. Stable irregular nodular opacities in the mid and upper right lung, please see recent chest CT report 07/10/2018 for details.  NO NEW EXAMS.   LABS REVIEWED PREVIOUS:   07-26-18 wbc 13.8; hgb 13.9; hct 41.8; mcv 89.5 ;plt 139; glucose 143; bun 16; creat 0.93; k+ 3.9; na++ 138; ca 8.6 tsh 0.408 07-29-18: wbc 18.6; hgb 12.6; hct 38.7; mcv 90.0; plt 143  glucose 112; bun 23; creat 1.00; k+ 3.9; na++ 138; ca 8.6 urine culture: e-coli ESBL: cipro   Blood culture: enterobacteriaceae species  07-31-18: wbc 8.9; hgb 12.9; hct 38.5; mcv 89.3 ;plt 139; glucose 124; bun 29; creat 1.16; k+ 3.7; na++ 133; ca 8.2   NO NEW LABS.    Review of Systems  Constitutional: Negative for malaise/fatigue.  HENT: Positive for congestion.   Respiratory: Negative for cough and shortness of breath.   Cardiovascular: Negative for chest pain, palpitations and leg swelling.  Gastrointestinal: Negative for abdominal pain, constipation and heartburn.  Musculoskeletal: Negative for back pain, joint pain and myalgias.  Skin: Negative.   Neurological: Positive for weakness. Negative for dizziness.       Leg weakness and numbness   Psychiatric/Behavioral: The patient is not nervous/anxious.      Physical Exam Constitutional:      General: He is not in acute distress.    Appearance: He is well-developed. He is not diaphoretic.  Neck:     Musculoskeletal: Neck supple.     Thyroid: No thyromegaly.  Cardiovascular:     Rate and Rhythm: Normal rate and regular rhythm.     Pulses: Normal pulses.     Heart sounds: Normal heart sounds.     Comments:  Psychologist, forensic present   Pulmonary:     Effort: Pulmonary effort is normal. No respiratory distress.     Breath sounds: Normal breath sounds.     Comments: History of left upper lobectomy and right upper lobe wedge resection  Abdominal:     General: Bowel sounds are normal. There is no distension.     Palpations: Abdomen is soft.     Tenderness: There is no abdominal tenderness.  Musculoskeletal:     Right lower leg: Edema present.     Left lower leg: Edema present.     Comments: Trace bilateral lower extremity edema R>L Is able to move all extremities Has bilateral lower extremity weakness    Lymphadenopathy:     Cervical: No cervical adenopathy.  Skin:    General: Skin is warm and dry.  Neurological:     Mental Status: He is alert and oriented to person, place, and time.  Psychiatric:        Mood and Affect: Mood normal.       ASSESSMENT/ PLAN:  TODAY:   1. Cauda equina syndrome: is status post L4-L5 laminectomy will continue therapy as directed and will follow up with neurosurgery as indicated will monitor  2. UTI due to extended spectrum beta lactamase (ESBL) producing escherichia cole: will complete cipro and will monitor his status.   3. Hyperlipidemia unspecified hyperlipidemia type: is stable will continue lipitor 20 mg daily   PREVIOUS  4. Anxiety: is stable will continue klonopin 1 mg three times daily   5. Chronic non-seasonal allergic rhinitis: is stable will stop claritin due to his request.   6.  Permanent atrial fibrillation:/ complete heart block: is status post pace maker insertion: will continue lopressor 25 mg  daily for rate control;  and is on long term eliquis 5 mg twice daily   7. Coronary artery disease involving native coronary artery of native heart without angina pectoris: is stable will continue lopressor 25 mg  daily has prn ntg is on eliquis and statin  8. Malignant neoplasm of hilus of lung unspecified laterality: is status post surgical and radiation therapy;  will monitor    MD is aware of resident's narcotic use and is in agreement with current plan of care. We will attempt to wean resident as apropriate   Ok Edwards NP [facility]  Contact [phone] Monday through Friday 8am- 5pm  After hours call [phone]

## 2018-08-08 ENCOUNTER — Emergency Department (HOSPITAL_COMMUNITY)
Admission: EM | Admit: 2018-08-08 | Discharge: 2018-08-08 | Disposition: A | Discharge status: Home / Self Care | Payer: Medicare Other | Attending: Emergency Medicine | Admitting: Emergency Medicine

## 2018-08-08 ENCOUNTER — Other Ambulatory Visit: Payer: Self-pay

## 2018-08-08 ENCOUNTER — Encounter (HOSPITAL_COMMUNITY): Payer: Self-pay | Admitting: Emergency Medicine

## 2018-08-08 ENCOUNTER — Inpatient Hospital Stay
Admission: RE | Admit: 2018-08-08 | Discharge: 2018-11-26 | Disposition: A | Discharge status: Other Acute Inpatient Hospital | Payer: Medicare Other | Source: Ambulatory Visit | Attending: Internal Medicine | Admitting: Internal Medicine

## 2018-08-08 DIAGNOSIS — I2510 Atherosclerotic heart disease of native coronary artery without angina pectoris: Secondary | ICD-10-CM | POA: Diagnosis not present

## 2018-08-08 DIAGNOSIS — T83091A Other mechanical complication of indwelling urethral catheter, initial encounter: Secondary | ICD-10-CM | POA: Diagnosis not present

## 2018-08-08 DIAGNOSIS — Z7901 Long term (current) use of anticoagulants: Secondary | ICD-10-CM | POA: Insufficient documentation

## 2018-08-08 DIAGNOSIS — Z79899 Other long term (current) drug therapy: Secondary | ICD-10-CM | POA: Diagnosis not present

## 2018-08-08 DIAGNOSIS — Z950 Presence of cardiac pacemaker: Secondary | ICD-10-CM | POA: Diagnosis not present

## 2018-08-08 DIAGNOSIS — C3431 Malignant neoplasm of lower lobe, right bronchus or lung: Secondary | ICD-10-CM

## 2018-08-08 DIAGNOSIS — M48062 Spinal stenosis, lumbar region with neurogenic claudication: Principal | ICD-10-CM

## 2018-08-08 DIAGNOSIS — R339 Retention of urine, unspecified: Secondary | ICD-10-CM | POA: Diagnosis present

## 2018-08-08 DIAGNOSIS — Z85828 Personal history of other malignant neoplasm of skin: Secondary | ICD-10-CM | POA: Insufficient documentation

## 2018-08-08 DIAGNOSIS — J3089 Other allergic rhinitis: Secondary | ICD-10-CM | POA: Insufficient documentation

## 2018-08-08 DIAGNOSIS — Z85118 Personal history of other malignant neoplasm of bronchus and lung: Secondary | ICD-10-CM | POA: Insufficient documentation

## 2018-08-08 DIAGNOSIS — G8220 Paraplegia, unspecified: Secondary | ICD-10-CM

## 2018-08-08 DIAGNOSIS — Y733 Surgical instruments, materials and gastroenterology and urology devices (including sutures) associated with adverse incidents: Secondary | ICD-10-CM | POA: Insufficient documentation

## 2018-08-08 DIAGNOSIS — Z87891 Personal history of nicotine dependence: Secondary | ICD-10-CM | POA: Insufficient documentation

## 2018-08-08 NOTE — ED Notes (Signed)
Deflated catheter balloon,advanced to y site.  re inflated with 10cc ns.  Irrigated catheter tubing and placed new collection bag.  One large clot irrigated from catheter tubing.  Urine is flowing freely at this time, 200cc pink tinged urine in collection bag.

## 2018-08-08 NOTE — ED Triage Notes (Signed)
Pt sent over by [facility] for urinary retention and blood in Foley catheter bag. Pt had a Foley catheter placed on Sunday and pt was sent to [facility] center. Pt was due to have catheter removed today. When staff removed catheter, pt was unable to urinate. They attempted to place another Foley and was unable to get urine return. They blew up Foley balloon and blood came out. Pt appears to just have blood in Foley bag. Pt denies any pain at this time.

## 2018-08-08 NOTE — ED Notes (Signed)
[facility] to come pick up patient

## 2018-08-09 ENCOUNTER — Encounter (HOSPITAL_COMMUNITY)
Admission: RE | Admit: 2018-08-09 | Discharge: 2018-08-09 | Disposition: A | Discharge status: Home / Self Care | Payer: Medicare Other | Source: Skilled Nursing Facility | Attending: *Deleted | Admitting: *Deleted

## 2018-08-09 LAB — SARS CORONAVIRUS 2 BY RT PCR (HOSPITAL ORDER, PERFORMED IN ~~LOC~~ HOSPITAL LAB): SARS Coronavirus 2: NEGATIVE

## 2018-08-13 ENCOUNTER — Encounter (HOSPITAL_COMMUNITY)
Admission: RE | Admit: 2018-08-13 | Discharge: 2018-08-13 | Disposition: A | Discharge status: Home / Self Care | Payer: Medicare Other | Source: Skilled Nursing Facility | Attending: Internal Medicine | Admitting: Internal Medicine

## 2018-08-13 DIAGNOSIS — R339 Retention of urine, unspecified: Secondary | ICD-10-CM | POA: Insufficient documentation

## 2018-08-13 DIAGNOSIS — Z48811 Encounter for surgical aftercare following surgery on the nervous system: Secondary | ICD-10-CM | POA: Insufficient documentation

## 2018-08-13 DIAGNOSIS — R7881 Bacteremia: Secondary | ICD-10-CM | POA: Insufficient documentation

## 2018-08-13 DIAGNOSIS — R600 Localized edema: Secondary | ICD-10-CM | POA: Insufficient documentation

## 2018-08-13 DIAGNOSIS — J309 Allergic rhinitis, unspecified: Secondary | ICD-10-CM | POA: Insufficient documentation

## 2018-08-13 LAB — BASIC METABOLIC PANEL
Anion gap: 8 (ref 5–15)
BUN: 19 mg/dL (ref 8–23)
CO2: 25 mmol/L (ref 22–32)
Calcium: 8 mg/dL — ABNORMAL LOW (ref 8.9–10.3)
Chloride: 101 mmol/L (ref 98–111)
Creatinine, Ser: 0.76 mg/dL (ref 0.61–1.24)
GFR calc Af Amer: >60 mL/min (ref >60)
GFR calc non Af Amer: >60 mL/min (ref >60)
Glucose, Bld: 103 mg/dL — ABNORMAL HIGH (ref 70–99)
Potassium: 4.1 mmol/L (ref 3.5–5.1)
Sodium: 134 mmol/L — ABNORMAL LOW (ref 135–145)

## 2018-08-13 LAB — CBC WITH DIFFERENTIAL/PLATELET
Abs Immature Granulocytes: 0.03 10*3/uL (ref 0.00–0.07)
Basophils Absolute: 0 10*3/uL (ref 0.0–0.1)
Basophils Relative: 1 %
Eosinophils Absolute: 0.4 10*3/uL (ref 0.0–0.5)
Eosinophils Relative: 4 %
HCT: 39.4 % (ref 39.0–52.0)
Hemoglobin: 12.7 g/dL — ABNORMAL LOW (ref 13.0–17.0)
Immature Granulocytes: 0 %
Lymphocytes Relative: 12 %
Lymphs Abs: 1 10*3/uL (ref 0.7–4.0)
MCH: 29.1 pg (ref 26.0–34.0)
MCHC: 32.2 g/dL (ref 30.0–36.0)
MCV: 90.4 fL (ref 80.0–100.0)
Monocytes Absolute: 1 10*3/uL (ref 0.1–1.0)
Monocytes Relative: 11 %
Neutro Abs: 6.2 10*3/uL (ref 1.7–7.7)
Neutrophils Relative %: 72 %
Platelets: 278 10*3/uL (ref 150–400)
RBC: 4.36 MIL/uL (ref 4.22–5.81)
RDW: 13.1 % (ref 11.5–15.5)
WBC: 8.6 10*3/uL (ref 4.0–10.5)
nRBC: 0 % (ref 0.0–0.2)

## 2018-08-13 NOTE — ED Provider Notes (Signed)
[facility] EMERGENCY DEPARTMENT Provider Note   CSN: [id number] Arrival date & time: 08/08/18  1634    History   Chief Complaint Chief Complaint  Patient presents with  . Urinary Retention    HPI Benjamin [name] is a 73 y.o. male.     HPI   74yM with foley problems. Changes by nursing facility today and hasn't been working since. Some blood noted in tubing and also some discomfort. No other acute complaints.   Past Medical History:  Diagnosis Date  . Adenopathy    RIGHT ADRENAL  . Anxiety   . Arthritis    KNEES/HANDS  . CAD (coronary artery disease)    a. Prior known CTO of LCX with recanalizationby cath 2008;  b. 09/2012 NSTEMI/Cath/PCI: LM nl, LAD 19m, D1 50p, LCX 100p (2.75x16 Promus Premier DES), 80-27m, OM1 30, RCA 3m (3.0x16 Promus Premier DES), EF 50-55%.  [name] History of echocardiogram    Echo 2/18: Moderate LVH, EF 55-60, normal wall motion, trivial AI, moderate LAE, mild RVE, mild RAE, mildly elevated pulmonary pressure  . Hx of melanoma of skin   . 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 Non-small cell lung cancer ([place])    a. s/p resection and XRT.  [name] PAF (paroxysmal atrial fibrillation) ([place])    a. not currently on anticoagulation (09/2012)  . Presence of permanent cardiac pacemaker   . 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 Uses walker     Patient Active Problem List   Diagnosis Date Noted  . Chronic non-seasonal allergic rhinitis 08/08/2018  . Urinary retention 08/05/2018  . UTI due to extended-spectrum beta lactamase (ESBL) producing Escherichia coli 08/01/2018  . Cauda equina syndrome ([place]) 07/25/2018  . Bilateral lower extremity edema 07/25/2018  . Incarcerated inguinal hernia 05/24/2018  . Complete heart block ([place]) 08/29/2016  . 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 Pneumothorax after biopsy 08/24/2015  . Hyperlipidemia 09/14/2014  . Mobitz type 1 second degree atrioventricular block  10/17/2012  . Hx of NSTEMI tx with DES to RCA and DES to LCx in 2014 10/16/2012  . Chest pain, atypical 07/02/2012  . Malignant neoplasm of hilus of lung ([place])   . Adenopathy   . Anxiety   . Arthritis   . Hx of melanoma of skin   . Permanent atrial fibrillation   . CAD (coronary artery disease)     Past Surgical History:  Procedure Laterality Date  . ADRENALEXTOMY  02/2000  . CARDIAC CATHETERIZATION  2008   LAD 40, D1 OK, D2 50, CFX 90, RCA OK, EF 60%; Consider PCI PRN but the stenosis is quite complex and we would end up jailing several large posterolateral branches    . HERNIA REPAIR  05/27/2018  . INGUINAL HERNIA REPAIR Right 05/27/2018   Procedure: INCARCERATED RIGHT INGUINAL HERNIA REPAIR;  Surgeon: Erroll Luna, MD;  Location: [place];  Service: General;  Laterality: Right;  . LEFT HEART CATHETERIZATION WITH CORONARY ANGIOGRAM N/A 10/17/2012   Procedure: LEFT HEART CATHETERIZATION WITH CORONARY ANGIOGRAM;  Surgeon: Sherren Mocha, MD;  Location: [facility] CATH LAB;  Service: Cardiovascular;  Laterality: N/A;  . LOBECTOMY LEFT UPPER LOBE  08/15/2001  . LUMBAR LAMINECTOMY/DECOMPRESSION MICRODISCECTOMY N/A 07/25/2018   Procedure: Lumbar Four-Five Laminectomy;  Surgeon: Kristeen Miss, MD;  Location: [place];  Service: Neurosurgery;  Laterality: N/A;  Lumbar Four-Five Laminectomy  . PACEMAKER IMPLANT N/A 08/29/2016   Procedure: Pacemaker Implant;  Surgeon: Cristopher Peru  W, MD;  Location: [place] CV LAB;  Service: Cardiovascular;  Laterality: N/A;  . Right VATS, mini thoracotomy, wedge resection of right upper  01/18/2004        Home Medications    Prior to Admission medications   Medication Sig Start Date End Date Taking? Authorizing Provider  acetaminophen (TYLENOL) 325 MG tablet Take 2 tablets (650 mg total) by mouth every 6 (six) hours as needed for mild pain (or temp > 100). 05/28/18   Rayburn, Floyce Stakes, PA-C  apixaban (ELIQUIS) 5 MG TABS tablet Take 5 mg by mouth 2 (two) times daily.  07/31/18   [provider]  atorvastatin (LIPITOR) 20 MG tablet Take 20 mg by mouth daily.     [provider]  clonazePAM (KLONOPIN) 1 MG tablet Take 1 tablet (1 mg total) by mouth 3 (three) times daily. 8am, 2pm, 8pm 08/01/18   Gerlene Fee, NP  LACTOBACILLUS PO Take 1 tablet by mouth 2 (two) times a day. X 2 weeks 07/31/18 08/13/18  [provider]  loratadine (CLARITIN) 10 MG tablet Take 10 mg by mouth at bedtime. 08/06/18   [provider]  metoprolol tartrate (LOPRESSOR) 25 MG tablet Take 25 mg by mouth daily. 07/31/18   [provider]  nitroGLYCERIN (NITROSTAT) 0.4 MG SL tablet Place 1 tablet (0.4 mg total) under the tongue every 5 (five) minutes x 3 doses as needed for chest pain. 10/18/12   Theora Gianotti, NP  NON FORMULARY Diet Type:  NAS 07/31/18   [provider]  tamsulosin (FLOMAX) 0.4 MG CAPS capsule Take 1 capsule (0.4 mg total) by mouth daily for 14 days. 08/04/18 08/18/18  Noemi Chapel, MD    Family History Family History  Problem Relation Age of Onset  . Heart attack Father   . Cancer Mother        lung  . Cancer Cousin        breast    Social History Social History   Tobacco Use  . Smoking status: Former Smoker    Packs/day: 1.00    Years: 40.00    Pack years: 40.00    Types: Cigarettes    Last attempt to quit: 08/15/2001    Years since quitting: 17.0  . Smokeless tobacco: Never Used  Substance Use Topics  . Alcohol use: No  . Drug use: No     Allergies   Codeine   Review of Systems Review of Systems  All systems reviewed and negative, other than as noted in HPI.  Physical Exam Updated Vital Signs BP 133/85 (BP Location: Left Arm)   Pulse 80   Temp 98.1 F (36.7 C) (Oral)   Resp 16   Ht 5\' 11"  (1.803 m)   Wt 89 kg   SpO2 100%   BMI 27.37 kg/m   Physical Exam Vitals signs and nursing note reviewed.  Constitutional:      General: He is not in acute distress.    Appearance: He  is well-developed.  HENT:     Head: Normocephalic and atraumatic.  Eyes:     General:        Right eye: No discharge.        Left eye: No discharge.     Conjunctiva/sclera: Conjunctivae normal.  Neck:     Musculoskeletal: Neck supple.  Cardiovascular:     Rate and Rhythm: Normal rate and regular rhythm.     Heart sounds: Normal heart sounds. No murmur. No friction rub. No  gallop.   Pulmonary:     Effort: Pulmonary effort is normal. No respiratory distress.     Breath sounds: Normal breath sounds.  Abdominal:     General: There is no distension.     Palpations: Abdomen is soft.     Tenderness: There is no abdominal tenderness.  Genitourinary:    Comments: Foley in place. Likely incorrectly positioned given how much cathter tubing is exposed. Small amount of gross blood in tubing. No significant urine. Mild suprapubic tenderness. No distension.  Musculoskeletal:        General: No tenderness.  Skin:    General: Skin is warm and dry.  Neurological:     Mental Status: He is alert.  Psychiatric:        Behavior: Behavior normal.        Thought Content: Thought content normal.      ED Treatments / Results  Labs (all labs ordered are listed, but only abnormal results are displayed) Labs Reviewed - No data to display  EKG None  Radiology No results found.  Procedures Procedures (including critical care time)  Medications Ordered in ED Medications - No data to display   Initial Impression / Assessment and Plan / ED Course  I have reviewed the triage vital signs and the nursing notes.  Pertinent labs & imaging results that were available during my care of the patient were reviewed by me and considered in my medical decision making (see chart for details).        73yM with malfunctioning foley. Malposition in urethra. Deflated by nursing. Advanced and irrigated. Some clots free flowing urine and now pink. Pt feeling better.   Final Clinical Impressions(s) / ED  Diagnoses   Final diagnoses:  Obstruction of Foley catheter, initial encounter [facility])  Urinary retention    ED Discharge Orders    None       Virgel Manifold, MD 08/13/18 [phone]

## 2018-08-14 ENCOUNTER — Non-Acute Institutional Stay (SKILLED_NURSING_FACILITY): Payer: Medicare Other | Admitting: Adult Health

## 2018-08-14 ENCOUNTER — Encounter: Payer: Self-pay | Admitting: Adult Health

## 2018-08-14 DIAGNOSIS — I442 Atrioventricular block, complete: Secondary | ICD-10-CM | POA: Diagnosis not present

## 2018-08-14 DIAGNOSIS — I4821 Permanent atrial fibrillation: Secondary | ICD-10-CM

## 2018-08-14 DIAGNOSIS — G834 Cauda equina syndrome: Secondary | ICD-10-CM | POA: Diagnosis not present

## 2018-08-14 DIAGNOSIS — F419 Anxiety disorder, unspecified: Secondary | ICD-10-CM

## 2018-08-14 NOTE — Progress Notes (Signed)
Location:   [place] Room Number: 160 P Place of Service:  SNF (31)   CODE STATUS: Full Code  Allergies  Allergen Reactions  . Codeine Other (See Comments)    Dizziness, stomach pain    Chief Complaint  Patient presents with  . Medical Management of Chronic Issues    Permanent atrial fibrillation; complete heart block; cauda quina syndrome; anxiety. Weekly follow up for the first 30 days post hospitalization. Care plan meeting.     HPI:  He is a 73 year old short term rehab patient being seen for the management of his chronic illnesses: afib; heart block; cauda quina syndrome; anxiety.  He denies any excessive anxiety. He does have some occasional bowel incontinence. Denies any uncontrolled pain. There are no reports of fevers present.  We have come together for his care plan meeting. There is family present via phone. He is weak; he requires moderate to max assist with adls. He has been able to ambulate a few steps with assistance. His goal is to return home; however; more than likely he will require long term placement.   Past Medical History:  Diagnosis Date  . Adenopathy    RIGHT ADRENAL  . Anxiety   . Arthritis    KNEES/HANDS  . CAD (coronary artery disease)    a. Prior known CTO of LCX with recanalizationby cath 2008;  b. 09/2012 NSTEMI/Cath/PCI: LM nl, LAD 61m, D1 50p, LCX 100p (2.75x16 Promus Premier DES), 80-30m, OM1 30, RCA 16m (3.0x16 Promus Premier DES), EF 50-55%.  [name] History of echocardiogram    Echo 2/18: Moderate LVH, EF 55-60, normal wall motion, trivial AI, moderate LAE, mild RVE, mild RAE, mildly elevated pulmonary pressure  . Hx of melanoma of skin   . 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 Non-small cell lung cancer ([place])    a. s/p resection and XRT.  [name] PAF (paroxysmal atrial fibrillation) ([place])    a. not currently on anticoagulation (09/2012)  . Presence of permanent cardiac pacemaker   . 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 Uses walker     Past Surgical History:  Procedure Laterality Date  . ADRENALEXTOMY  02/2000  . CARDIAC CATHETERIZATION  2008   LAD 40, D1 OK, D2 50, CFX 90, RCA OK, EF 60%; Consider PCI PRN but the stenosis is quite complex and we would end up jailing several large posterolateral branches    . HERNIA REPAIR  05/27/2018  . INGUINAL HERNIA REPAIR Right 05/27/2018   Procedure: INCARCERATED RIGHT INGUINAL HERNIA REPAIR;  Surgeon: Erroll Luna, MD;  Location: [place];  Service: General;  Laterality: Right;  . LEFT HEART CATHETERIZATION WITH CORONARY ANGIOGRAM N/A 10/17/2012   Procedure: LEFT HEART CATHETERIZATION WITH CORONARY ANGIOGRAM;  Surgeon: Sherren Mocha, MD;  Location: [facility] CATH LAB;  Service: Cardiovascular;  Laterality: N/A;  . LOBECTOMY LEFT UPPER LOBE  08/15/2001  . LUMBAR LAMINECTOMY/DECOMPRESSION MICRODISCECTOMY N/A 07/25/2018   Procedure: Lumbar Four-Five Laminectomy;  Surgeon: Kristeen Miss, MD;  Location: [place];  Service: Neurosurgery;  Laterality: N/A;  Lumbar Four-Five Laminectomy  . PACEMAKER IMPLANT N/A 08/29/2016   Procedure: Pacemaker Implant;  Surgeon: Evans Lance, MD;  Location: [place] CV LAB;  Service: Cardiovascular;  Laterality: N/A;  . Right VATS, mini thoracotomy, wedge resection of right upper  01/18/2004    Social History   Socioeconomic History  . Marital status: Divorced    Spouse name: Not on file  .  Number of children: Not on file  . Years of education: Not on file  . Highest education level: Not on file  Occupational History  . Occupation: Retired    Fish farm manager: [employer]  . Financial resource strain: Not on file  . Food insecurity:    Worry: Not on file    Inability: Not on file  . Transportation needs:    Medical: Not on file    Non-medical: Not on file  Tobacco Use  . Smoking status: Former Smoker    Packs/day: 1.00    Years: 40.00    Pack years: 40.00    Types: Cigarettes    Last attempt to  quit: 08/15/2001    Years since quitting: 17.0  . Smokeless tobacco: Never Used  Substance and Sexual Activity  . Alcohol use: No  . Drug use: No  . Sexual activity: Not Currently  Lifestyle  . Physical activity:    Days per week: Not on file    Minutes per session: Not on file  . Stress: Not on file  Relationships  . Social connections:    Talks on phone: Not on file    Gets together: Not on file    Attends religious service: Not on file    Active member of club or organization: Not on file    Attends meetings of clubs or organizations: Not on file    Relationship status: Not on file  . Intimate partner violence:    Fear of current or ex partner: Not on file    Emotionally abused: Not on file    Physically abused: Not on file    Forced sexual activity: Not on file  Other Topics Concern  . Not on file  Social History Narrative  . Not on file   Family History  Problem Relation Age of Onset  . Heart attack Father   . Cancer Mother        lung  . Cancer Cousin        breast      VITAL SIGNS BP 115/72   Pulse 68   Temp (!) 97.2 F (36.2 C)   Resp 20   Ht 5\' 11"  (1.803 m)   Wt 197 lb 14.4 oz (89.8 kg)   BMI 27.60 kg/m   Outpatient Encounter Medications as of 08/14/2018  Medication Sig  . acetaminophen (TYLENOL) 325 MG tablet Take 2 tablets (650 mg total) by mouth every 6 (six) hours as needed for mild pain (or temp > 100).  [name] apixaban (ELIQUIS) 5 MG TABS tablet Take 5 mg by mouth 2 (two) times daily.  [name] atorvastatin (LIPITOR) 20 MG tablet Take 20 mg by mouth daily.   . clonazePAM (KLONOPIN) 1 MG tablet Take 1 tablet (1 mg total) by mouth 3 (three) times daily. 8am, 2pm, 8pm  . metoprolol tartrate (LOPRESSOR) 25 MG tablet Take 25 mg by mouth daily.  . nitroGLYCERIN (NITROSTAT) 0.4 MG SL tablet Place 1 tablet (0.4 mg total) under the tongue every 5 (five) minutes x 3 doses as needed for chest pain.  . NON FORMULARY Diet Type:  NAS  . tamsulosin (FLOMAX) 0.4 MG CAPS  capsule Take 1 capsule (0.4 mg total) by mouth daily for 14 days.  . [DISCONTINUED] loratadine (CLARITIN) 10 MG tablet Take 10 mg by mouth at bedtime.   No facility-administered encounter medications on file as of 08/14/2018.      SIGNIFICANT DIAGNOSTIC EXAMS  PREVIOUS:   07-29-18: chest x-ray:  1. No acute cardiopulmonary disease. 2. Stable post treatment changes in the upper left hemithorax. 3. Stable irregular nodular opacities in the mid and upper right lung, please see recent chest CT report 07/10/2018 for details.  NO NEW EXAMS.   LABS REVIEWED PREVIOUS:   07-26-18 wbc 13.8; hgb 13.9; hct 41.8; mcv 89.5 ;plt 139; glucose 143; bun 16; creat 0.93; k+ 3.9; na++ 138; ca 8.6 tsh 0.408 07-29-18: wbc 18.6; hgb 12.6; hct 38.7; mcv 90.0; plt 143  glucose 112; bun 23; creat 1.00; k+ 3.9; na++ 138; ca 8.6 urine culture: e-coli ESBL: cipro   Blood culture: enterobacteriaceae species  07-31-18: wbc 8.9; hgb 12.9; hct 38.5; mcv 89.3 ;plt 139; glucose 124; bun 29; creat 1.16; k+ 3.7; na++ 133; ca 8.2   TODAY  07-31-18: urine culture: none 08-14-18: wbc 8.6; hgb 12.7; hct 39.4; mcv 90.4 ;plt 278; glucose 103; bun 19; creat 0.76; k+ 4.1; na++ 134; ca 8.0    Review of Systems  Constitutional: Negative for malaise/fatigue.  Respiratory: Negative for cough and shortness of breath.   Cardiovascular: Negative for chest pain, palpitations and leg swelling.  Gastrointestinal: Negative for abdominal pain, constipation and heartburn.  Musculoskeletal: Negative for back pain, joint pain and myalgias.  Skin: Negative.   Neurological: Positive for weakness. Negative for dizziness.       Bilateral leg numbness and weakness   Psychiatric/Behavioral: The patient is not nervous/anxious.       Physical Exam Constitutional:      General: He is not in acute distress.    Appearance: He is well-developed. He is not diaphoretic.  Neck:     Musculoskeletal: Neck supple.     Thyroid: No thyromegaly.   Cardiovascular:     Rate and Rhythm: Normal rate and regular rhythm.     Heart sounds: Normal heart sounds.     Comments: Psychologist, forensic present  Pulmonary:     Effort: Pulmonary effort is normal. No respiratory distress.     Breath sounds: Normal breath sounds.     Comments: History of left upper lobectomy and right upper lobe wedge resection  Abdominal:     General: Bowel sounds are normal. There is no distension.     Palpations: Abdomen is soft.     Tenderness: There is no abdominal tenderness.  Genitourinary:    Comments: Foley  Musculoskeletal:     Right lower leg: Edema present.     Left lower leg: Edema present.     Comments: Trace bilateral lower extremity edema R>L Is able to move all extremities Has bilateral lower extremity weakness     Lymphadenopathy:     Cervical: No cervical adenopathy.  Skin:    General: Skin is warm and dry.  Neurological:     Mental Status: He is alert and oriented to person, place, and time.  Psychiatric:        Mood and Affect: Mood normal.       ASSESSMENT/ PLAN:  TODAY:   1. Cauda equina syndrome: is status post L4-L5 laminectomy is weak; will continue therapy as directed and will follow up with neurosurgery as indicated will monitor  2. Anxiety: is stable will continue klonopin 1 mg three times daily   3. Permanent atrial fibrillation/complete heart block:  Status post pace maker: is stable will continue lopressor 25 mg daily for rate control is on long term eliquis 5 mg twice daily   PREVIOUS  4. Hyperlipidemia unspecified hyperlipidemia type: is stable will continue lipitor 20 mg  daily   5. Chronic non-seasonal allergic rhinitis: is stable will monitor   6. Coronary artery disease involving native coronary artery of native heart without angina pectoris: is stable will continue lopressor 25 mg  daily has prn ntg is on eliquis and statin  7. Malignant neoplasm of hilus of lung unspecified laterality: is status post surgical and  radiation therapy; will monitor  8. Urine retention: is stable; has foley at this time awaiting urology consult.    His family is going to discuss with him long term care such as SNF or assisted living.  Is due to urology on 08-19-18.   MD is aware of resident's narcotic use and is in agreement with current plan of care. We will attempt to wean resident as apropriate   Ok Edwards NP [facility]  Contact [phone] Monday through Friday 8am- 5pm  After hours call [phone]

## 2018-08-22 ENCOUNTER — Encounter: Payer: Self-pay | Admitting: Adult Health

## 2018-08-22 ENCOUNTER — Non-Acute Institutional Stay (SKILLED_NURSING_FACILITY): Payer: Medicare Other | Admitting: Adult Health

## 2018-08-22 DIAGNOSIS — I2510 Atherosclerotic heart disease of native coronary artery without angina pectoris: Secondary | ICD-10-CM

## 2018-08-22 DIAGNOSIS — G834 Cauda equina syndrome: Secondary | ICD-10-CM

## 2018-08-22 DIAGNOSIS — E785 Hyperlipidemia, unspecified: Secondary | ICD-10-CM | POA: Diagnosis not present

## 2018-08-22 NOTE — Progress Notes (Signed)
Location:   [place] Room Number: 143 P Place of Service:  SNF (31)   CODE STATUS: Full Code  Allergies  Allergen Reactions  . Codeine Other (See Comments)    Dizziness, stomach pain    Chief Complaint  Patient presents with  . Medical Management of Chronic Issues    Coronary artery disease involving native coronary artery of native heart without angina pectoris; hyperlipidemia unspecified hyperlipidemia type; cauda quina syndrome. Weekly follow up for the first 30 days post hospitalization.     HPI:  He is a 73 year old short term rehab patient being seen for the management of his chronic illnesses: cad; hyperlipidemia; cauda quina. He denies any pain; he continues to have numbness and weakness in both his lower extremities. He is unable to stand. He denies any changes in appetite; is sleeping well at night. There are no reports of fevers present.   Past Medical History:  Diagnosis Date  . Adenopathy    RIGHT ADRENAL  . Anxiety   . Arthritis    KNEES/HANDS  . CAD (coronary artery disease)    a. Prior known CTO of LCX with recanalizationby cath 2008;  b. 09/2012 NSTEMI/Cath/PCI: LM nl, LAD 7m, D1 50p, LCX 100p (2.75x16 Promus Premier DES), 80-28m, OM1 30, RCA 64m (3.0x16 Promus Premier DES), EF 50-55%.  [name] History of echocardiogram    Echo 2/18: Moderate LVH, EF 55-60, normal wall motion, trivial AI, moderate LAE, mild RVE, mild RAE, mildly elevated pulmonary pressure  . Hx of melanoma of skin   . 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 Non-small cell lung cancer ([place])    a. s/p resection and XRT.  [name] PAF (paroxysmal atrial fibrillation) ([place])    a. not currently on anticoagulation (09/2012)  . Presence of permanent cardiac pacemaker   . 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 Uses walker     Past Surgical History:  Procedure Laterality Date  . ADRENALEXTOMY  02/2000  . CARDIAC CATHETERIZATION  2008   LAD 40, D1 OK, D2 50,  CFX 90, RCA OK, EF 60%; Consider PCI PRN but the stenosis is quite complex and we would end up jailing several large posterolateral branches    . HERNIA REPAIR  05/27/2018  . INGUINAL HERNIA REPAIR Right 05/27/2018   Procedure: INCARCERATED RIGHT INGUINAL HERNIA REPAIR;  Surgeon: Erroll Luna, MD;  Location: [place];  Service: General;  Laterality: Right;  . LEFT HEART CATHETERIZATION WITH CORONARY ANGIOGRAM N/A 10/17/2012   Procedure: LEFT HEART CATHETERIZATION WITH CORONARY ANGIOGRAM;  Surgeon: Sherren Mocha, MD;  Location: [facility] CATH LAB;  Service: Cardiovascular;  Laterality: N/A;  . LOBECTOMY LEFT UPPER LOBE  08/15/2001  . LUMBAR LAMINECTOMY/DECOMPRESSION MICRODISCECTOMY N/A 07/25/2018   Procedure: Lumbar Four-Five Laminectomy;  Surgeon: Kristeen Miss, MD;  Location: [place];  Service: Neurosurgery;  Laterality: N/A;  Lumbar Four-Five Laminectomy  . PACEMAKER IMPLANT N/A 08/29/2016   Procedure: Pacemaker Implant;  Surgeon: Evans Lance, MD;  Location: [place] CV LAB;  Service: Cardiovascular;  Laterality: N/A;  . Right VATS, mini thoracotomy, wedge resection of right upper  01/18/2004    Social History   Socioeconomic History  . Marital status: Divorced    Spouse name: Not on file  . Number of children: Not on file  . Years of education: Not on file  . Highest education level: Not on file  Occupational History  . Occupation: Retired    Fish farm manager: [employer]  Social Needs  . Financial resource strain: Not on file  . Food insecurity:    Worry: Not on file    Inability: Not on file  . Transportation needs:    Medical: Not on file    Non-medical: Not on file  Tobacco Use  . Smoking status: Former Smoker    Packs/day: 1.00    Years: 40.00    Pack years: 40.00    Types: Cigarettes    Last attempt to quit: 08/15/2001    Years since quitting: 17.0  . Smokeless tobacco: Never Used  Substance and Sexual Activity  . Alcohol use: No  . Drug use: No  . Sexual activity: Not  Currently  Lifestyle  . Physical activity:    Days per week: Not on file    Minutes per session: Not on file  . Stress: Not on file  Relationships  . Social connections:    Talks on phone: Not on file    Gets together: Not on file    Attends religious service: Not on file    Active member of club or organization: Not on file    Attends meetings of clubs or organizations: Not on file    Relationship status: Not on file  . Intimate partner violence:    Fear of current or ex partner: Not on file    Emotionally abused: Not on file    Physically abused: Not on file    Forced sexual activity: Not on file  Other Topics Concern  . Not on file  Social History Narrative  . Not on file   Family History  Problem Relation Age of Onset  . Heart attack Father   . Cancer Mother        lung  . Cancer Cousin        breast      VITAL SIGNS BP 117/64   Pulse 66   Temp 98 F (36.7 C)   Resp 18   Ht 5\' 11"  (1.803 m)   Wt 192 lb 6.4 oz (87.3 kg)   BMI 26.83 kg/m   Outpatient Encounter Medications as of 08/22/2018  Medication Sig  . acetaminophen (TYLENOL) 325 MG tablet Take 2 tablets (650 mg total) by mouth every 6 (six) hours as needed for mild pain (or temp > 100).  [name] apixaban (ELIQUIS) 5 MG TABS tablet Take 5 mg by mouth 2 (two) times daily.  [name] atorvastatin (LIPITOR) 20 MG tablet Take 20 mg by mouth daily.   . clonazePAM (KLONOPIN) 1 MG tablet Take 1 tablet (1 mg total) by mouth 3 (three) times daily. 8am, 2pm, 8pm  . magnesium hydroxide (MILK OF MAGNESIA) 400 MG/5ML suspension Take 30 mLs by mouth daily as needed for mild constipation.  . metoprolol tartrate (LOPRESSOR) 25 MG tablet Take 25 mg by mouth daily.  . nitroGLYCERIN (NITROSTAT) 0.4 MG SL tablet Place 1 tablet (0.4 mg total) under the tongue every 5 (five) minutes x 3 doses as needed for chest pain.  . NON FORMULARY Diet Type:  NAS  . polyethylene glycol (MIRALAX / GLYCOLAX) 17 g packet Take 17 g by mouth daily.  .  tamsulosin (FLOMAX) 0.4 MG CAPS capsule Take 0.4 mg by mouth every evening.   No facility-administered encounter medications on file as of 08/22/2018.      SIGNIFICANT DIAGNOSTIC EXAMS  PREVIOUS:   07-29-18: chest x-ray:  1. No acute cardiopulmonary disease. 2. Stable post treatment changes in the upper left hemithorax. 3. Stable irregular nodular  opacities in the mid and upper right lung, please see recent chest CT report 07/10/2018 for details.  NO NEW EXAMS.   LABS REVIEWED PREVIOUS:   07-26-18 wbc 13.8; hgb 13.9; hct 41.8; mcv 89.5 ;plt 139; glucose 143; bun 16; creat 0.93; k+ 3.9; na++ 138; ca 8.6 tsh 0.408 07-29-18: wbc 18.6; hgb 12.6; hct 38.7; mcv 90.0; plt 143  glucose 112; bun 23; creat 1.00; k+ 3.9; na++ 138; ca 8.6 urine culture: e-coli ESBL: cipro   Blood culture: enterobacteriaceae species  07-31-18: wbc 8.9; hgb 12.9; hct 38.5; mcv 89.3 ;plt 139; glucose 124; bun 29; creat 1.16; k+ 3.7; na++ 133; ca 8.2  07-31-18: urine culture: none 08-13-18: wbc 8.6; hgb 12.7; hct 39.4; mcv 90.4 ;plt 278; glucose 103; bun 19; creat 0.76; k+ 4.1; na++ 134; ca 8.0   NO NEW LABS.    Review of Systems  Constitutional: Negative for malaise/fatigue.  Respiratory: Negative for cough and shortness of breath.   Cardiovascular: Negative for chest pain, palpitations and leg swelling.  Gastrointestinal: Positive for constipation. Negative for abdominal pain and heartburn.  Musculoskeletal: Negative for back pain, joint pain and myalgias.  Skin: Negative.   Neurological: Positive for weakness. Negative for dizziness.       Has bilateral lower extremity weakness and numbness   Psychiatric/Behavioral: The patient is not nervous/anxious.    Physical Exam Constitutional:      General: He is not in acute distress.    Appearance: He is well-developed. He is not diaphoretic.  Neck:     Musculoskeletal: Neck supple.     Thyroid: No thyromegaly.  Cardiovascular:     Rate and Rhythm: Normal rate and  regular rhythm.     Pulses: Normal pulses.     Heart sounds: Normal heart sounds.     Comments: Pace maker  Pulmonary:     Effort: Pulmonary effort is normal. No respiratory distress.     Breath sounds: Normal breath sounds.     Comments: History of left upper lobectomy and right upper lobe wedge resection  Abdominal:     General: Bowel sounds are normal. There is no distension.     Palpations: Abdomen is soft.     Tenderness: There is no abdominal tenderness.  Genitourinary:    Comments: Has foley  Musculoskeletal: Normal range of motion.     Right lower leg: Edema present.     Left lower leg: Edema present.     Comments:  1+ bilateral lower extremity edema  Is able to move all extremities Has bilateral lower extremity weakness      Lymphadenopathy:     Cervical: No cervical adenopathy.  Skin:    General: Skin is warm and dry.  Neurological:     Mental Status: He is alert and oriented to person, place, and time.  Psychiatric:        Mood and Affect: Mood normal.     ASSESSMENT/ PLAN:  TODAY:   1. Cauda equina syndrome: is status post L4-L5 laminectomy is weak; will continue therapy as directed and will follow up with neurosurgery as indicated will monitor he continues to have bilateral lower extremity weakness and numbness   2. Hyperlipidemia unspecified hyperlipidemia type: is stable will continue lipitor 20 mg daily  3. Coronary artery disease involving native coronary artery of native heart without angina pectoris: is stable will continue lopressor 25 mg daily and has prn ntg. Is on statin and eliquis    PREVIOUS  4. Chronic non-seasonal allergic rhinitis:  is stable will monitor   5. Malignant neoplasm of hilus of lung unspecified laterality: is status post surgical and radiation therapy; will monitor  6. Urine retention: is stable; has foley will continue flomax daily .   7. Anxiety: is stable will continue klonopin 1 mg three times daily   8. Permanent atrial  fibrillation/complete heart block:  Status post pace maker: is stable will continue lopressor 25 mg daily for rate control is on long term eliquis 5 mg twice daily   9. Chronic constipation: is without change was stared on miralax 17 gm daily on 08-21-18.      MD is aware of resident's narcotic use and is in agreement with current plan of care. We will attempt to wean resident as apropriate   Ok Edwards NP [facility]  Contact [phone] Monday through Friday 8am- 5pm  After hours call [phone]

## 2018-08-26 ENCOUNTER — Other Ambulatory Visit: Payer: Self-pay | Admitting: Adult Health

## 2018-08-26 MED ORDER — CLONAZEPAM 1 MG PO TABS: 1.0000 mg | ORAL_TABLET | Freq: Three times a day (TID) | ORAL | 0 refills | Status: DC

## 2018-08-28 ENCOUNTER — Non-Acute Institutional Stay (SKILLED_NURSING_FACILITY): Payer: Medicare Other | Admitting: Adult Health

## 2018-08-28 ENCOUNTER — Other Ambulatory Visit: Payer: Self-pay | Admitting: Adult Health

## 2018-08-28 ENCOUNTER — Encounter: Payer: Self-pay | Admitting: Adult Health

## 2018-08-28 DIAGNOSIS — I2510 Atherosclerotic heart disease of native coronary artery without angina pectoris: Secondary | ICD-10-CM

## 2018-08-28 DIAGNOSIS — G834 Cauda equina syndrome: Secondary | ICD-10-CM | POA: Diagnosis not present

## 2018-08-28 DIAGNOSIS — I4821 Permanent atrial fibrillation: Secondary | ICD-10-CM | POA: Diagnosis not present

## 2018-08-28 DIAGNOSIS — R339 Retention of urine, unspecified: Secondary | ICD-10-CM

## 2018-08-28 NOTE — Progress Notes (Signed)
Location:   [facility] Room Number: 143 P Place of Service:  SNF (31)   CODE STATUS: Full Code  Allergies  Allergen Reactions  . Codeine Other (See Comments)    Dizziness, stomach pain    Chief Complaint  Patient presents with  . Acute Visit    Care Plan Meeting    HPI:  We have come together for his care plan meeting. He does have his brother present via phone. His BIMS 15/15 depression 8/30.  He has given up his "room" he had prior to his hospitalization. The goal of his care is for possible assisted living; pace and other programs to help him maintain his independence. He is unable to stand; he has no feelings in his legs. He is able to use slide board easily. He requires assistance for his lower body dressing etc. He doe shave knee pain when he is out of bed for prolonged periods of time. He denies that his pain is out of control. There are no reports of fevers present.   Past Medical History:  Diagnosis Date  . Adenopathy    RIGHT ADRENAL  . Anxiety   . Arthritis    KNEES/HANDS  . CAD (coronary artery disease)    a. Prior known CTO of LCX with recanalizationby cath 2008;  b. 09/2012 NSTEMI/Cath/PCI: LM nl, LAD 82m, D1 50p, LCX 100p (2.75x16 Promus Premier DES), 80-75m, OM1 30, RCA 51m (3.0x16 Promus Premier DES), EF 50-55%.  [name] History of echocardiogram    Echo 2/18: Moderate LVH, EF 55-60, normal wall motion, trivial AI, moderate LAE, mild RVE, mild RAE, mildly elevated pulmonary pressure  . Hx of melanoma of skin   . 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 Non-small cell lung cancer ([place])    a. s/p resection and XRT.  [name] PAF (paroxysmal atrial fibrillation) ([place])    a. not currently on anticoagulation (09/2012)  . Presence of permanent cardiac pacemaker   . 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 Uses walker     Past Surgical History:  Procedure Laterality Date  . ADRENALEXTOMY  02/2000  . CARDIAC CATHETERIZATION  2008   LAD 40, D1 OK, D2 50, CFX 90, RCA OK, EF 60%; Consider PCI PRN but the stenosis is quite complex and we would end up jailing several large posterolateral branches    . HERNIA REPAIR  05/27/2018  . INGUINAL HERNIA REPAIR Right 05/27/2018   Procedure: INCARCERATED RIGHT INGUINAL HERNIA REPAIR;  Surgeon: Erroll Luna, MD;  Location: [facility];  Service: General;  Laterality: Right;  . LEFT HEART CATHETERIZATION WITH CORONARY ANGIOGRAM N/A 10/17/2012   Procedure: LEFT HEART CATHETERIZATION WITH CORONARY ANGIOGRAM;  Surgeon: Sherren Mocha, MD;  Location: [facility] CATH LAB;  Service: Cardiovascular;  Laterality: N/A;  . LOBECTOMY LEFT UPPER LOBE  08/15/2001  . LUMBAR LAMINECTOMY/DECOMPRESSION MICRODISCECTOMY N/A 07/25/2018   Procedure: Lumbar Four-Five Laminectomy;  Surgeon: Kristeen Miss, MD;  Location: [facility];  Service: Neurosurgery;  Laterality: N/A;  Lumbar Four-Five Laminectomy  . PACEMAKER IMPLANT N/A 08/29/2016   Procedure: Pacemaker Implant;  Surgeon: Evans Lance, MD;  Location: [facility] CV LAB;  Service: Cardiovascular;  Laterality: N/A;  . Right VATS, mini thoracotomy, wedge resection of right upper  01/18/2004    Social History   Socioeconomic History  . Marital status: Divorced    Spouse name: Not on file  . Number of children: Not on file  . Years of education: Not on  file  . Highest education level: Not on file  Occupational History  . Occupation: Retired    Fish farm manager: [employer]  . Financial resource strain: Not on file  . Food insecurity:    Worry: Not on file    Inability: Not on file  . Transportation needs:    Medical: Not on file    Non-medical: Not on file  Tobacco Use  . Smoking status: Former Smoker    Packs/day: 1.00    Years: 40.00    Pack years: 40.00    Types: Cigarettes    Last attempt to quit: 08/15/2001    Years since quitting: 17.0  . Smokeless tobacco: Never Used  Substance and Sexual Activity  . Alcohol use: No  . Drug use: No  .  Sexual activity: Not Currently  Lifestyle  . Physical activity:    Days per week: Not on file    Minutes per session: Not on file  . Stress: Not on file  Relationships  . Social connections:    Talks on phone: Not on file    Gets together: Not on file    Attends religious service: Not on file    Active member of club or organization: Not on file    Attends meetings of clubs or organizations: Not on file    Relationship status: Not on file  . Intimate partner violence:    Fear of current or ex partner: Not on file    Emotionally abused: Not on file    Physically abused: Not on file    Forced sexual activity: Not on file  Other Topics Concern  . Not on file  Social History Narrative  . Not on file   Family History  Problem Relation Age of Onset  . Heart attack Father   . Cancer Mother        lung  . Cancer Cousin        breast      VITAL SIGNS BP 127/71   Pulse 60   Temp (!) 97.2 F (36.2 C)   Resp 20   Ht 5\' 11"  (1.803 m)   Wt 192 lb 6.4 oz (87.3 kg)   BMI 26.83 kg/m   Outpatient Encounter Medications as of 08/28/2018  Medication Sig  . acetaminophen (TYLENOL) 325 MG tablet Take 2 tablets (650 mg total) by mouth every 6 (six) hours as needed for mild pain (or temp > 100).  [name] apixaban (ELIQUIS) 5 MG TABS tablet Take 5 mg by mouth 2 (two) times daily.  [name] atorvastatin (LIPITOR) 20 MG tablet Take 20 mg by mouth daily.   . bisacodyl (DULCOLAX) 10 MG suppository Place 10 mg rectally daily.  . clonazePAM (KLONOPIN) 1 MG tablet Take 1 tablet (1 mg total) by mouth 3 (three) times daily. 8am, 2pm, 8pm  . magnesium hydroxide (MILK OF MAGNESIA) 400 MG/5ML suspension Take 30 mLs by mouth daily as needed for mild constipation.  . metoprolol tartrate (LOPRESSOR) 25 MG tablet Take 25 mg by mouth daily.  . nitroGLYCERIN (NITROSTAT) 0.4 MG SL tablet Place 1 tablet (0.4 mg total) under the tongue every 5 (five) minutes x 3 doses as needed for chest pain.  . NON FORMULARY Diet Type:   NAS  . polyethylene glycol (MIRALAX / GLYCOLAX) 17 g packet Take 17 g by mouth daily.  . tamsulosin (FLOMAX) 0.4 MG CAPS capsule Take 0.4 mg by mouth every evening.   No facility-administered encounter medications on file as of  08/28/2018.      SIGNIFICANT DIAGNOSTIC EXAMS  PREVIOUS:   07-29-18: chest x-ray:  1. No acute cardiopulmonary disease. 2. Stable post treatment changes in the upper left hemithorax. 3. Stable irregular nodular opacities in the mid and upper right lung, please see recent chest CT report 07/10/2018 for details.  NO NEW EXAMS.   LABS REVIEWED PREVIOUS:   07-26-18 wbc 13.8; hgb 13.9; hct 41.8; mcv 89.5 ;plt 139; glucose 143; bun 16; creat 0.93; k+ 3.9; na++ 138; ca 8.6 tsh 0.408 07-29-18: wbc 18.6; hgb 12.6; hct 38.7; mcv 90.0; plt 143  glucose 112; bun 23; creat 1.00; k+ 3.9; na++ 138; ca 8.6 urine culture: e-coli ESBL: cipro   Blood culture: enterobacteriaceae species  07-31-18: wbc 8.9; hgb 12.9; hct 38.5; mcv 89.3 ;plt 139; glucose 124; bun 29; creat 1.16; k+ 3.7; na++ 133; ca 8.2  07-31-18: urine culture: none 08-13-18: wbc 8.6; hgb 12.7; hct 39.4; mcv 90.4 ;plt 278; glucose 103; bun 19; creat 0.76; k+ 4.1; na++ 134; ca 8.0   NO NEW LABS.    Review of Systems  Constitutional: Negative for malaise/fatigue.  Respiratory: Negative for cough and shortness of breath.   Cardiovascular: Negative for chest pain, palpitations and leg swelling.  Gastrointestinal: Negative for abdominal pain, constipation and heartburn.  Musculoskeletal: Negative for back pain, joint pain and myalgias.  Skin: Negative.   Neurological: Positive for weakness. Negative for dizziness.       Has bilateral lower extremity weakness and numbness present.   Psychiatric/Behavioral: The patient is not nervous/anxious.      Physical Exam Constitutional:      General: He is not in acute distress.    Appearance: He is well-developed. He is not diaphoretic.  Neck:     Musculoskeletal: Neck  supple.     Thyroid: No thyromegaly.  Cardiovascular:     Rate and Rhythm: Normal rate and regular rhythm.     Pulses: Normal pulses.     Heart sounds: Normal heart sounds.     Comments: Pace maker  Pulmonary:     Effort: Pulmonary effort is normal. No respiratory distress.     Breath sounds: Normal breath sounds.     Comments: History of left upper lobectomy and right upper lobe wedge resection  Abdominal:     General: Bowel sounds are normal. There is no distension.     Palpations: Abdomen is soft.     Tenderness: There is no abdominal tenderness.  Genitourinary:    Comments: Foley  Musculoskeletal:     Right lower leg: Edema present.     Left lower leg: Edema present.     Comments:  1+ bilateral lower extremity edema  Is able to move all extremities Has bilateral lower extremity weakness       Lymphadenopathy:     Cervical: No cervical adenopathy.  Skin:    General: Skin is warm and dry.  Neurological:     Mental Status: He is alert and oriented to person, place, and time.  Psychiatric:        Mood and Affect: Mood normal.     ASSESSMENT/ PLAN:  TODAY:   1. Cauda equina syndrome: is status post L4-L5 laminectomy 2. Coronary artery disease involving native coronary artery of native heart without angina pectoris:  3. Urine retention:  4. Permanent atrial fibrillation/complete heart block:    Will continue therapy as directed Will continue his current medication regimen Will continue to monitor his status Will continue to work upon a  discharge plan for him.    MD is aware of resident's narcotic use and is in agreement with current plan of care. We will attempt to wean resident as apropriate   Ok Edwards NP [facility] Adult [facility]  Contact [phone] Monday through Friday 8am- 5pm  After hours call [phone]

## 2018-08-29 ENCOUNTER — Encounter: Payer: Self-pay | Admitting: Adult Health

## 2018-08-29 ENCOUNTER — Non-Acute Institutional Stay (SKILLED_NURSING_FACILITY): Payer: Medicare Other | Admitting: Adult Health

## 2018-08-29 ENCOUNTER — Other Ambulatory Visit: Payer: Self-pay | Admitting: Adult Health

## 2018-08-29 DIAGNOSIS — I442 Atrioventricular block, complete: Secondary | ICD-10-CM

## 2018-08-29 DIAGNOSIS — I4821 Permanent atrial fibrillation: Secondary | ICD-10-CM

## 2018-08-29 DIAGNOSIS — K5909 Other constipation: Secondary | ICD-10-CM | POA: Diagnosis not present

## 2018-08-29 DIAGNOSIS — F419 Anxiety disorder, unspecified: Secondary | ICD-10-CM | POA: Diagnosis not present

## 2018-08-29 MED ORDER — CLONAZEPAM 0.5 MG PO TABS: 0.5000 mg | ORAL_TABLET | Freq: Three times a day (TID) | ORAL | 0 refills | Status: DC

## 2018-08-29 NOTE — Progress Notes (Signed)
Location:   [facility] Room Number: 143 P Place of Service:  SNF (31)   CODE STATUS: Full Code  Allergies  Allergen Reactions  . Codeine Other (See Comments)    Dizziness, stomach pain    Chief Complaint  Patient presents with  . Medical Management of Chronic Issues    Anxiety chronic constipation; permanent atrial fibrillation; complete heart block.  Weekly follow up for the first 30 days post hospitalization  / Psychotropic review    HPI:  He is a 73 year old short term rehab patient being seen for the management of his chronic illnesses: anxiety; constipation; afib; complete heart block. He has been on long term klonopin at 1 mg three times daily.  He continues to do poorly in therapy; except for the use of a slide board. He denies any issues with anxiety denies any uncontrolled pain. There are no changes in his appetite; no reports of insomnia. We will try to wean him off klonopin.   Past Medical History:  Diagnosis Date  . Adenopathy    RIGHT ADRENAL  . Anxiety   . Arthritis    KNEES/HANDS  . CAD (coronary artery disease)    a. Prior known CTO of LCX with recanalizationby cath 2008;  b. 09/2012 NSTEMI/Cath/PCI: LM nl, LAD 68m, D1 50p, LCX 100p (2.75x16 Promus Premier DES), 80-60m, OM1 30, RCA 44m (3.0x16 Promus Premier DES), EF 50-55%.  [name] History of echocardiogram    Echo 2/18: Moderate LVH, EF 55-60, normal wall motion, trivial AI, moderate LAE, mild RVE, mild RAE, mildly elevated pulmonary pressure  . Hx of melanoma of skin   . 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 Non-small cell lung cancer ([place])    a. s/p resection and XRT.  [name] PAF (paroxysmal atrial fibrillation) ([place])    a. not currently on anticoagulation (09/2012)  . Presence of permanent cardiac pacemaker   . 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 Uses walker     Past Surgical History:  Procedure Laterality Date  . ADRENALEXTOMY  02/2000  . CARDIAC  CATHETERIZATION  2008   LAD 40, D1 OK, D2 50, CFX 90, RCA OK, EF 60%; Consider PCI PRN but the stenosis is quite complex and we would end up jailing several large posterolateral branches    . HERNIA REPAIR  05/27/2018  . INGUINAL HERNIA REPAIR Right 05/27/2018   Procedure: INCARCERATED RIGHT INGUINAL HERNIA REPAIR;  Surgeon: Erroll Luna, MD;  Location: [facility];  Service: General;  Laterality: Right;  . LEFT HEART CATHETERIZATION WITH CORONARY ANGIOGRAM N/A 10/17/2012   Procedure: LEFT HEART CATHETERIZATION WITH CORONARY ANGIOGRAM;  Surgeon: Sherren Mocha, MD;  Location: [facility] CATH LAB;  Service: Cardiovascular;  Laterality: N/A;  . LOBECTOMY LEFT UPPER LOBE  08/15/2001  . LUMBAR LAMINECTOMY/DECOMPRESSION MICRODISCECTOMY N/A 07/25/2018   Procedure: Lumbar Four-Five Laminectomy;  Surgeon: Kristeen Miss, MD;  Location: [facility];  Service: Neurosurgery;  Laterality: N/A;  Lumbar Four-Five Laminectomy  . PACEMAKER IMPLANT N/A 08/29/2016   Procedure: Pacemaker Implant;  Surgeon: Evans Lance, MD;  Location: [facility] CV LAB;  Service: Cardiovascular;  Laterality: N/A;  . Right VATS, mini thoracotomy, wedge resection of right upper  01/18/2004    Social History   Socioeconomic History  . Marital status: Divorced    Spouse name: Not on file  . Number of children: Not on file  . Years of education: Not on file  . Highest education level: Not on  file  Occupational History  . Occupation: Retired    Fish farm manager: [employer]  . Financial resource strain: Not on file  . Food insecurity    Worry: Not on file    Inability: Not on file  . Transportation needs    Medical: Not on file    Non-medical: Not on file  Tobacco Use  . Smoking status: Former Smoker    Packs/day: 1.00    Years: 40.00    Pack years: 40.00    Types: Cigarettes    Quit date: 08/15/2001    Years since quitting: 17.0  . Smokeless tobacco: Never Used  Substance and Sexual Activity  . Alcohol use: No  . Drug  use: No  . Sexual activity: Not Currently  Lifestyle  . Physical activity    Days per week: Not on file    Minutes per session: Not on file  . Stress: Not on file  Relationships  . Social Herbalist on phone: Not on file    Gets together: Not on file    Attends religious service: Not on file    Active member of club or organization: Not on file    Attends meetings of clubs or organizations: Not on file    Relationship status: Not on file  . Intimate partner violence    Fear of current or ex partner: Not on file    Emotionally abused: Not on file    Physically abused: Not on file    Forced sexual activity: Not on file  Other Topics Concern  . Not on file  Social History Narrative  . Not on file   Family History  Problem Relation Age of Onset  . Heart attack Father   . Cancer Mother        lung  . Cancer Cousin        breast      VITAL SIGNS BP 102/62   Pulse 71   Temp (!) 97.5 F (36.4 C)   Resp 20   Ht 5\' 11"  (1.803 m)   Wt 192 lb 6.4 oz (87.3 kg)   BMI 26.83 kg/m   Outpatient Encounter Medications as of 08/29/2018  Medication Sig  . acetaminophen (TYLENOL) 325 MG tablet Take 2 tablets (650 mg total) by mouth every 6 (six) hours as needed for mild pain (or temp > 100).  [name] apixaban (ELIQUIS) 5 MG TABS tablet Take 5 mg by mouth 2 (two) times daily.  [name] atorvastatin (LIPITOR) 20 MG tablet Take 20 mg by mouth daily.   Roseanne Kaufman Peru-Castor Oil (VENELEX) OINT Apply topically to sacrum every shift until resolved  . bisacodyl (DULCOLAX) 10 MG suppository Place 10 mg rectally daily.  . clonazePAM (KLONOPIN) 1 MG tablet Take 1 tablet (1 mg total) by mouth 3 (three) times daily. 8am, 2pm, 8pm  . magnesium hydroxide (MILK OF MAGNESIA) 400 MG/5ML suspension Take 30 mLs by mouth daily as needed for mild constipation.  . metoprolol tartrate (LOPRESSOR) 25 MG tablet Take 25 mg by mouth daily.  . nitroGLYCERIN (NITROSTAT) 0.4 MG SL tablet Place 1 tablet (0.4 mg total)  under the tongue every 5 (five) minutes x 3 doses as needed for chest pain.  . NON FORMULARY Diet Type:  NAS  . polyethylene glycol (MIRALAX / GLYCOLAX) 17 g packet Take 17 g by mouth daily.  . tamsulosin (FLOMAX) 0.4 MG CAPS capsule Take 0.4 mg by mouth every evening.   No facility-administered encounter  medications on file as of 08/29/2018.      SIGNIFICANT DIAGNOSTIC EXAMS   PREVIOUS:   07-29-18: chest x-ray:  1. No acute cardiopulmonary disease. 2. Stable post treatment changes in the upper left hemithorax. 3. Stable irregular nodular opacities in the mid and upper right lung, please see recent chest CT report 07/10/2018 for details.  NO NEW EXAMS.   LABS REVIEWED PREVIOUS:   07-26-18 wbc 13.8; hgb 13.9; hct 41.8; mcv 89.5 ;plt 139; glucose 143; bun 16; creat 0.93; k+ 3.9; na++ 138; ca 8.6 tsh 0.408 07-29-18: wbc 18.6; hgb 12.6; hct 38.7; mcv 90.0; plt 143  glucose 112; bun 23; creat 1.00; k+ 3.9; na++ 138; ca 8.6 urine culture: e-coli ESBL: cipro   Blood culture: enterobacteriaceae species  07-31-18: wbc 8.9; hgb 12.9; hct 38.5; mcv 89.3 ;plt 139; glucose 124; bun 29; creat 1.16; k+ 3.7; na++ 133; ca 8.2  07-31-18: urine culture: none 08-13-18: wbc 8.6; hgb 12.7; hct 39.4; mcv 90.4 ;plt 278; glucose 103; bun 19; creat 0.76; k+ 4.1; na++ 134; ca 8.0   NO NEW LABS.    Review of Systems  Constitutional: Negative for malaise/fatigue.  Respiratory: Negative for cough and shortness of breath.   Cardiovascular: Negative for chest pain, palpitations and leg swelling.  Gastrointestinal: Negative for abdominal pain, constipation and heartburn.  Musculoskeletal: Negative for back pain, joint pain and myalgias.  Skin: Negative.   Neurological: Positive for weakness. Negative for dizziness.       Bilateral lower extremity weakness and numbness   Psychiatric/Behavioral: The patient is not nervous/anxious.      Physical Exam Constitutional:      General: He is not in acute distress.     Appearance: He is well-developed. He is not diaphoretic.  Neck:     Musculoskeletal: Neck supple.     Thyroid: No thyromegaly.  Cardiovascular:     Rate and Rhythm: Normal rate and regular rhythm.     Pulses: Normal pulses.     Heart sounds: Normal heart sounds.     Comments: Pace maker  Pulmonary:     Effort: Pulmonary effort is normal. No respiratory distress.     Breath sounds: Normal breath sounds.     Comments: History of left upper lobectomy and right upper lobe wedge resection  Abdominal:     General: Bowel sounds are normal. There is no distension.     Palpations: Abdomen is soft.     Tenderness: There is no abdominal tenderness.  Genitourinary:    Comments: foley Musculoskeletal:     Right lower leg: Edema present.     Left lower leg: Edema present.     Comments: 1+ bilateral lower extremity edema  Is able to move all extremities Has bilateral lower extremity weakness        Lymphadenopathy:     Cervical: No cervical adenopathy.  Skin:    General: Skin is warm and dry.  Neurological:     Mental Status: He is alert and oriented to person, place, and time.  Psychiatric:        Mood and Affect: Mood normal.      ASSESSMENT/ PLAN:  TODAY:   1. Anxiety: is stable; at this time will lower klonopin to 0.5 mg three times daily and will monitor his status.   2. Permanent atrial fibrillation/complete heart block: is status post pace maker: is stable will continue lopressor 25 mg daily for rate control and is on long term eliquis 5 mg twice daily  3. Chronic constipation: is stable will continue miralax daily   PREVIOUS  4. Chronic non-seasonal allergic rhinitis: is stable will monitor   5. Malignant neoplasm of hilus of lung unspecified laterality: is status post surgical and radiation therapy; will monitor  6. Urine retention: is stable; has foley will continue flomax daily .   7. Cauda equina syndrome: is status post L4-L5 laminectomy is weak; will continue  therapy as directed and will follow up with neurosurgery as indicated will monitor he continues to have bilateral lower extremity weakness and numbness   8. Hyperlipidemia unspecified hyperlipidemia type: is stable will continue lipitor 20 mg daily  9. Coronary artery disease involving native coronary artery of native heart without angina pectoris: is stable will continue lopressor 25 mg daily and has prn ntg. Is on statin and eliquis    MD is aware of resident's narcotic use and is in agreement with current plan of care. We will attempt to wean resident as apropriate   Ok Edwards NP [facility] Adult Medicine  Contact [phone] Monday through Friday 8am- 5pm  After hours call [phone]

## 2018-08-30 ENCOUNTER — Telehealth: Payer: Self-pay

## 2018-08-30 NOTE — Telephone Encounter (Signed)
Pt answered No to all Covid-19 screening questions. I advised the pt to wear a mask and to come by himself if he can. The pt states his brother has to come with him because he is in a wheelchair. I told him his brother can come and he will have to wear a mask. The pt verbalized understanding.

## 2018-08-30 NOTE — Telephone Encounter (Signed)
LMOVM      COVID-19 Pre-Screening Questions:  . In the past 7 to 10 days have you had a cough,  shortness of breath, headache, congestion, fever (100 or greater) body aches, chills, sore throat, or sudden loss of taste or sense of smell? . Have you been around anyone with known Covid 19. . Have you been around anyone who is awaiting Covid 19 test results in the past 7 to 10 days? . Have you been around anyone who has been exposed to Covid 19, or has mentioned symptoms of Covid 19 within the past 7 to 10 days?  If you have any concerns/questions about symptoms patients report during screening (either on the phone or at [facility]). Contact the provider seeing the patient or DOD for further guidance.  If neither are available contact a member of the leadership team.

## 2018-08-31 DIAGNOSIS — K5909 Other constipation: Secondary | ICD-10-CM | POA: Insufficient documentation

## 2018-09-02 ENCOUNTER — Telehealth: Payer: Self-pay | Admitting: Cardiovascular Disease

## 2018-09-02 NOTE — Telephone Encounter (Signed)
Yes, per amber we are hoping to start [facility] back next month

## 2018-09-02 NOTE — Telephone Encounter (Signed)
New message   Patient would like to know if he can go to [facility] to have a wound check? Please call.

## 2018-09-02 NOTE — Telephone Encounter (Signed)
Pt is recovering from a back surgery at the [facility] center and wanted to know if he could be seen at [facility].  Let pt know we will be seeing patients in [place] again starting next month. He wishes to reschedule and be seen in July in [place]. He said any day but 09/25/2018, as he sees his back surgeon that day.   Thanks!  Legrand Como [address]" [place], [place] 09/02/2018 3:38 PM

## 2018-09-02 NOTE — Telephone Encounter (Signed)
Per [facility] 09/07/17 "Does not transmit from home". Not clear if this means the device doesn't work or if the pt was unable to send them. Either way he's at a rehab facility for the time being, but hopefully we can re-address remotes when he comes in.

## 2018-09-03 ENCOUNTER — Encounter: Payer: Self-pay | Admitting: Adult Health

## 2018-09-03 ENCOUNTER — Other Ambulatory Visit: Payer: Self-pay | Admitting: Adult Health

## 2018-09-03 ENCOUNTER — Non-Acute Institutional Stay (SKILLED_NURSING_FACILITY): Payer: Medicare Other | Admitting: Adult Health

## 2018-09-03 DIAGNOSIS — R600 Localized edema: Secondary | ICD-10-CM

## 2018-09-03 DIAGNOSIS — F419 Anxiety disorder, unspecified: Secondary | ICD-10-CM

## 2018-09-03 MED ORDER — CLONAZEPAM 1 MG PO TABS: 1.0000 mg | ORAL_TABLET | Freq: Three times a day (TID) | ORAL | 0 refills | Status: DC

## 2018-09-03 NOTE — Progress Notes (Signed)
Location:   [place] Room Number: 143 P Place of Service:  SNF (31)   CODE STATUS: Full Code  Allergies  Allergen Reactions  . Codeine Other (See Comments)    Dizziness, stomach pain    Chief Complaint  Patient presents with  . Acute Visit    Anxiety / Edema    HPI:  He had his klonopin was lowered this past week for his gradual does reduction. His anxiety has worsened since lowering his klonopin. He is also complaining of worsening bilateral lower extremity edema. He denies any chest pain or shortness of breath. He does have some insomnia present.   Past Medical History:  Diagnosis Date  . Adenopathy    RIGHT ADRENAL  . Anxiety   . Arthritis    KNEES/HANDS  . CAD (coronary artery disease)    a. Prior known CTO of LCX with recanalizationby cath 2008;  b. 09/2012 NSTEMI/Cath/PCI: LM nl, LAD 61m, D1 50p, LCX 100p (2.75x16 Promus Premier DES), 80-62m, OM1 30, RCA 97m (3.0x16 Promus Premier DES), EF 50-55%.  [name] History of echocardiogram    Echo 2/18: Moderate LVH, EF 55-60, normal wall motion, trivial AI, moderate LAE, mild RVE, mild RAE, mildly elevated pulmonary pressure  . Hx of melanoma of skin   . 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 Non-small cell lung cancer ([place])    a. s/p resection and XRT.  [name] PAF (paroxysmal atrial fibrillation) ([place])    a. not currently on anticoagulation (09/2012)  . Presence of permanent cardiac pacemaker   . 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 Uses walker     Past Surgical History:  Procedure Laterality Date  . ADRENALEXTOMY  02/2000  . CARDIAC CATHETERIZATION  2008   LAD 40, D1 OK, D2 50, CFX 90, RCA OK, EF 60%; Consider PCI PRN but the stenosis is quite complex and we would end up jailing several large posterolateral branches    . HERNIA REPAIR  05/27/2018  . INGUINAL HERNIA REPAIR Right 05/27/2018   Procedure: INCARCERATED RIGHT INGUINAL HERNIA REPAIR;  Surgeon: Erroll Luna, MD;   Location: [place];  Service: General;  Laterality: Right;  . LEFT HEART CATHETERIZATION WITH CORONARY ANGIOGRAM N/A 10/17/2012   Procedure: LEFT HEART CATHETERIZATION WITH CORONARY ANGIOGRAM;  Surgeon: Sherren Mocha, MD;  Location: [facility] CATH LAB;  Service: Cardiovascular;  Laterality: N/A;  . LOBECTOMY LEFT UPPER LOBE  08/15/2001  . LUMBAR LAMINECTOMY/DECOMPRESSION MICRODISCECTOMY N/A 07/25/2018   Procedure: Lumbar Four-Five Laminectomy;  Surgeon: Kristeen Miss, MD;  Location: [place];  Service: Neurosurgery;  Laterality: N/A;  Lumbar Four-Five Laminectomy  . PACEMAKER IMPLANT N/A 08/29/2016   Procedure: Pacemaker Implant;  Surgeon: Evans Lance, MD;  Location: [place] CV LAB;  Service: Cardiovascular;  Laterality: N/A;  . Right VATS, mini thoracotomy, wedge resection of right upper  01/18/2004    Social History   Socioeconomic History  . Marital status: Divorced    Spouse name: Not on file  . Number of children: Not on file  . Years of education: Not on file  . Highest education level: Not on file  Occupational History  . Occupation: Retired    Fish farm manager: [employer]  . Financial resource strain: Not on file  . Food insecurity    Worry: Not on file    Inability: Not on file  . Transportation needs    Medical: Not on file    Non-medical: Not on  file  Tobacco Use  . Smoking status: Former Smoker    Packs/day: 1.00    Years: 40.00    Pack years: 40.00    Types: Cigarettes    Quit date: 08/15/2001    Years since quitting: 17.0  . Smokeless tobacco: Never Used  Substance and Sexual Activity  . Alcohol use: No  . Drug use: No  . Sexual activity: Not Currently  Lifestyle  . Physical activity    Days per week: Not on file    Minutes per session: Not on file  . Stress: Not on file  Relationships  . Social Herbalist on phone: Not on file    Gets together: Not on file    Attends religious service: Not on file    Active member of club or  organization: Not on file    Attends meetings of clubs or organizations: Not on file    Relationship status: Not on file  . Intimate partner violence    Fear of current or ex partner: Not on file    Emotionally abused: Not on file    Physically abused: Not on file    Forced sexual activity: Not on file  Other Topics Concern  . Not on file  Social History Narrative  . Not on file   Family History  Problem Relation Age of Onset  . Heart attack Father   . Cancer Mother        lung  . Cancer Cousin        breast      VITAL SIGNS BP (!) 141/74   Pulse 60   Temp 98.5 F (36.9 C)   Resp 20   Ht 5\' 11"  (1.803 m)   Wt 195 lb 6.4 oz (88.6 kg)   BMI 27.25 kg/m   Outpatient Encounter Medications as of 09/03/2018  Medication Sig  . acetaminophen (TYLENOL) 325 MG tablet Take 2 tablets (650 mg total) by mouth every 6 (six) hours as needed for mild pain (or temp > 100).  [name] apixaban (ELIQUIS) 5 MG TABS tablet Take 5 mg by mouth 2 (two) times daily.  [name] atorvastatin (LIPITOR) 20 MG tablet Take 20 mg by mouth daily.   Roseanne Kaufman Peru-Castor Oil (VENELEX) OINT Apply topically to sacrum every shift until resolved  . clonazePAM (KLONOPIN) 0.5 MG tablet Take 1 tablet (0.5 mg total) by mouth 3 (three) times daily.  . magnesium hydroxide (MILK OF MAGNESIA) 400 MG/5ML suspension Take 30 mLs by mouth daily as needed for mild constipation.  . metoprolol tartrate (LOPRESSOR) 25 MG tablet Take 25 mg by mouth daily.  . nitroGLYCERIN (NITROSTAT) 0.4 MG SL tablet Place 1 tablet (0.4 mg total) under the tongue every 5 (five) minutes x 3 doses as needed for chest pain.  . NON FORMULARY Diet Type:  NAS  . polyethylene glycol (MIRALAX / GLYCOLAX) 17 g packet Take 17 g by mouth daily.  . tamsulosin (FLOMAX) 0.4 MG CAPS capsule Take 0.4 mg by mouth every evening.  . [DISCONTINUED] bisacodyl (DULCOLAX) 10 MG suppository Place 10 mg rectally daily.   No facility-administered encounter medications on file as of  09/03/2018.      SIGNIFICANT DIAGNOSTIC EXAMS  PREVIOUS:   07-29-18: chest x-ray:  1. No acute cardiopulmonary disease. 2. Stable post treatment changes in the upper left hemithorax. 3. Stable irregular nodular opacities in the mid and upper right lung, please see recent chest CT report 07/10/2018 for details.  NO NEW EXAMS.  LABS REVIEWED PREVIOUS:   07-26-18 wbc 13.8; hgb 13.9; hct 41.8; mcv 89.5 ;plt 139; glucose 143; bun 16; creat 0.93; k+ 3.9; na++ 138; ca 8.6 tsh 0.408 07-29-18: wbc 18.6; hgb 12.6; hct 38.7; mcv 90.0; plt 143  glucose 112; bun 23; creat 1.00; k+ 3.9; na++ 138; ca 8.6 urine culture: e-coli ESBL: cipro   Blood culture: enterobacteriaceae species  07-31-18: wbc 8.9; hgb 12.9; hct 38.5; mcv 89.3 ;plt 139; glucose 124; bun 29; creat 1.16; k+ 3.7; na++ 133; ca 8.2  07-31-18: urine culture: none 08-13-18: wbc 8.6; hgb 12.7; hct 39.4; mcv 90.4 ;plt 278; glucose 103; bun 19; creat 0.76; k+ 4.1; na++ 134; ca 8.0   NO NEW LABS.    Review of Systems  Constitutional: Negative for malaise/fatigue.  Respiratory: Negative for cough and shortness of breath.   Cardiovascular: Negative for chest pain, palpitations and leg swelling.  Gastrointestinal: Negative for abdominal pain, constipation and heartburn.  Musculoskeletal: Negative for back pain, joint pain and myalgias.  Skin: Negative.   Neurological: Positive for weakness. Negative for dizziness.       Bilateral lower extremity weakness and numbness    Psychiatric/Behavioral: The patient is nervous/anxious and has insomnia.      Physical Exam Constitutional:      General: He is not in acute distress.    Appearance: He is well-developed. He is not diaphoretic.  Neck:     Musculoskeletal: Neck supple.     Thyroid: No thyromegaly.  Cardiovascular:     Rate and Rhythm: Normal rate and regular rhythm.     Pulses: Normal pulses.     Heart sounds: Normal heart sounds.     Comments: Pace maker  Pulmonary:     Effort:  Pulmonary effort is normal. No respiratory distress.     Breath sounds: Normal breath sounds.     Comments: History of left upper lobectomy and right upper lobe wedge resection  Abdominal:     General: Bowel sounds are normal. There is no distension.     Palpations: Abdomen is soft.     Tenderness: There is no abdominal tenderness.  Musculoskeletal:     Right lower leg: Edema present.     Left lower leg: Edema present.     Comments: 2+ bilateral lower extremity edema  Is able to move all extremities He has bilateral lower extremity numbness  Has bilateral lower extremity weakness         Lymphadenopathy:     Cervical: No cervical adenopathy.  Skin:    General: Skin is warm and dry.  Neurological:     Mental Status: He is alert and oriented to person, place, and time.  Psychiatric:        Mood and Affect: Mood normal.      ASSESSMENT/ PLAN:  TODAY:   1. Anxiety 2. Bilateral lower extremity edema  Is worse Has failed his GDR will return his klonopin to 1 mg three times daily  Will beign lasix 40 mg daily with k+ 10 meq daily Will check bmp on 09-10-18      MD is aware of resident's narcotic use and is in agreement with current plan of care. We will attempt to wean resident as apropriate   Ok Edwards NP [facility] Adult Medicine  Contact [phone] Monday through Friday 8am- 5pm  After hours call [phone]

## 2018-09-04 ENCOUNTER — Telehealth: Payer: Self-pay | Admitting: Cardiovascular Disease

## 2018-09-04 ENCOUNTER — Other Ambulatory Visit: Payer: Self-pay | Admitting: Radiation Oncology

## 2018-09-04 NOTE — Telephone Encounter (Signed)
New message   Patient has questions about his device and if he can wait until July to have a pacer check in July in [place] per the previous message. Please call.

## 2018-09-04 NOTE — Telephone Encounter (Signed)
Spoke w/ pt and he informed me that he doesn't have his home monitor. He lives in a facility now and the monitor is packed up in his brothers basement. Pt has an appt w/ GT at the [place] office 11/12/2018. He wants to know if this is too long to wait to have his device checked. Pt has an appt w/ Dr. Cathie Olden on 10/21/2018. Pt last PPM check was 09/08/2018. Patient now lives at [facility] in [place]. He believes this will be a permanent residence for him. Instructed pt that I will call [employer] and have him a new monitor ordered.   Called [employer] and ordered pt a new monitor and had it shipped to the [facility].

## 2018-09-05 ENCOUNTER — Non-Acute Institutional Stay (SKILLED_NURSING_FACILITY): Payer: Medicare Other | Admitting: Adult Health

## 2018-09-05 ENCOUNTER — Telehealth: Payer: Self-pay | Admitting: *Deleted

## 2018-09-05 ENCOUNTER — Telehealth: Payer: Self-pay | Admitting: Radiation Oncology

## 2018-09-05 DIAGNOSIS — R339 Retention of urine, unspecified: Secondary | ICD-10-CM

## 2018-09-05 DIAGNOSIS — E785 Hyperlipidemia, unspecified: Secondary | ICD-10-CM

## 2018-09-05 DIAGNOSIS — C3400 Malignant neoplasm of unspecified main bronchus: Secondary | ICD-10-CM | POA: Diagnosis not present

## 2018-09-05 NOTE — Telephone Encounter (Signed)
Called patient to inform that he doesn't need to come for fu on 09-09-18 and his next scan will be in Dec. 2020, lvm for a return call

## 2018-09-05 NOTE — Telephone Encounter (Signed)
Pt had recent CT chest in April ordered by PCP. Since this was early, I reviewed and we can push out to 6 months after, which would be October 2020. I let him know his scan from April was reassuring however. He is in agreement and will continue with his PT plans at the SNF he's currently in.

## 2018-09-09 ENCOUNTER — Ambulatory Visit: Payer: Medicare Other | Admitting: Radiation Oncology

## 2018-09-10 ENCOUNTER — Encounter: Payer: Self-pay | Admitting: Adult Health

## 2018-09-10 ENCOUNTER — Other Ambulatory Visit (HOSPITAL_COMMUNITY)
Admission: RE | Admit: 2018-09-10 | Discharge: 2018-09-10 | Disposition: A | Discharge status: Home / Self Care | Payer: Medicare Other | Source: Skilled Nursing Facility | Attending: Adult Health | Admitting: Adult Health

## 2018-09-10 DIAGNOSIS — I509 Heart failure, unspecified: Secondary | ICD-10-CM | POA: Insufficient documentation

## 2018-09-10 LAB — BASIC METABOLIC PANEL
Anion gap: 9 (ref 5–15)
BUN: 19 mg/dL (ref 8–23)
CO2: 26 mmol/L (ref 22–32)
Calcium: 8.3 mg/dL — ABNORMAL LOW (ref 8.9–10.3)
Chloride: 101 mmol/L (ref 98–111)
Creatinine, Ser: 0.86 mg/dL (ref 0.61–1.24)
GFR calc Af Amer: >60 mL/min (ref >60)
GFR calc non Af Amer: >60 mL/min (ref >60)
Glucose, Bld: 99 mg/dL (ref 70–99)
Potassium: 3.7 mmol/L (ref 3.5–5.1)
Sodium: 136 mmol/L (ref 135–145)

## 2018-09-10 NOTE — Progress Notes (Signed)
Location:   [place] Room Number: 143 P Place of Service:  SNF (31)   CODE STATUS: Full Code  Allergies  Allergen Reactions  . Codeine Other (See Comments)    Dizziness, stomach pain    Chief Complaint  Patient presents with  . Medical Management of Chronic Issues       Urine retention: Malignant neoplasm of hilus of lung unspecified laterality:Hyperlipidemia; unspecified hyperlipidemia:     HPI:  He is a 73 year old long term resident of this facility being seen for the management of his chronic illnesses: urine retention; lung cancer; hyperlipidemia. He denies any uncontrolled pain; no change in his appetite. He denies any anxiety or insomnia. He did not tolerate the wean from his klonopin. There are no reports of fevers present.   Past Medical History:  Diagnosis Date  . Adenopathy    RIGHT ADRENAL  . Anxiety   . Arthritis    KNEES/HANDS  . CAD (coronary artery disease)    a. Prior known CTO of LCX with recanalizationby cath 2008;  b. 09/2012 NSTEMI/Cath/PCI: LM nl, LAD 32m, D1 50p, LCX 100p (2.75x16 Promus Premier DES), 80-60m, OM1 30, RCA 29m (3.0x16 Promus Premier DES), EF 50-55%.  [name] History of echocardiogram    Echo 2/18: Moderate LVH, EF 55-60, normal wall motion, trivial AI, moderate LAE, mild RVE, mild RAE, mildly elevated pulmonary pressure  . Hx of melanoma of skin   . 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 Non-small cell lung cancer ([place])    a. s/p resection and XRT.  [name] PAF (paroxysmal atrial fibrillation) ([place])    a. not currently on anticoagulation (09/2012)  . Presence of permanent cardiac pacemaker   . 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 Uses walker     Past Surgical History:  Procedure Laterality Date  . ADRENALEXTOMY  02/2000  . CARDIAC CATHETERIZATION  2008   LAD 40, D1 OK, D2 50, CFX 90, RCA OK, EF 60%; Consider PCI PRN but the stenosis is quite complex and we would end up jailing several large  posterolateral branches    . HERNIA REPAIR  05/27/2018  . INGUINAL HERNIA REPAIR Right 05/27/2018   Procedure: INCARCERATED RIGHT INGUINAL HERNIA REPAIR;  Surgeon: Erroll Luna, MD;  Location: [place];  Service: General;  Laterality: Right;  . LEFT HEART CATHETERIZATION WITH CORONARY ANGIOGRAM N/A 10/17/2012   Procedure: LEFT HEART CATHETERIZATION WITH CORONARY ANGIOGRAM;  Surgeon: Sherren Mocha, MD;  Location: [facility] CATH LAB;  Service: Cardiovascular;  Laterality: N/A;  . LOBECTOMY LEFT UPPER LOBE  08/15/2001  . LUMBAR LAMINECTOMY/DECOMPRESSION MICRODISCECTOMY N/A 07/25/2018   Procedure: Lumbar Four-Five Laminectomy;  Surgeon: Kristeen Miss, MD;  Location: [place];  Service: Neurosurgery;  Laterality: N/A;  Lumbar Four-Five Laminectomy  . PACEMAKER IMPLANT N/A 08/29/2016   Procedure: Pacemaker Implant;  Surgeon: Evans Lance, MD;  Location: [place] CV LAB;  Service: Cardiovascular;  Laterality: N/A;  . Right VATS, mini thoracotomy, wedge resection of right upper  01/18/2004    Social History   Socioeconomic History  . Marital status: Divorced    Spouse name: Not on file  . Number of children: Not on file  . Years of education: Not on file  . Highest education level: Not on file  Occupational History  . 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 Financial resource strain: Not on file  . Food insecurity    Worry:  Not on file    Inability: Not on file  . Transportation needs    Medical: Not on file    Non-medical: Not on file  Tobacco Use  . Smoking status: Former Smoker    Packs/day: 1.00    Years: 40.00    Pack years: 40.00    Types: Cigarettes    Quit date: 08/15/2001    Years since quitting: 17.0  . Smokeless tobacco: Never Used  Substance and Sexual Activity  . Alcohol use: No  . Drug use: No  . Sexual activity: Not Currently  Lifestyle  . Physical activity    Days per week: Not on file    Minutes per session: Not on file  . Stress: Not on  file  Relationships  . Social Herbalist on phone: Not on file    Gets together: Not on file    Attends religious service: Not on file    Active member of club or organization: Not on file    Attends meetings of clubs or organizations: Not on file    Relationship status: Not on file  . Intimate partner violence    Fear of current or ex partner: Not on file    Emotionally abused: Not on file    Physically abused: Not on file    Forced sexual activity: Not on file  Other Topics Concern  . Not on file  Social History Narrative  . Not on file   Family History  Problem Relation Age of Onset  . Heart attack Father   . Cancer Mother        lung  . Cancer Cousin        breast      VITAL SIGNS BP 116/68   Pulse 67   Temp 97.7 F (36.5 C)   Resp 20   Ht 5\' 11"  (1.803 m)   Wt 192 lb 6.4 oz (87.3 kg)   BMI 26.83 kg/m   Outpatient Encounter Medications as of 09/05/2018  Medication Sig  . acetaminophen (TYLENOL) 325 MG tablet Take 2 tablets (650 mg total) by mouth every 6 (six) hours as needed for mild pain (or temp > 100).  [name] apixaban (ELIQUIS) 5 MG TABS tablet Take 5 mg by mouth 2 (two) times daily.  [name] atorvastatin (LIPITOR) 20 MG tablet Take 20 mg by mouth daily.   Roseanne Kaufman Peru-Castor Oil (VENELEX) OINT Apply topically to sacrum every shift until resolved  . clonazePAM (KLONOPIN) 1 MG tablet Take 1 tablet (1 mg total) by mouth 3 (three) times daily.  . furosemide (LASIX) 40 MG tablet Take 40 mg by mouth daily.  . magnesium hydroxide (MILK OF MAGNESIA) 400 MG/5ML suspension Take 30 mLs by mouth daily as needed for mild constipation.  . metoprolol tartrate (LOPRESSOR) 25 MG tablet Take 25 mg by mouth daily.  . nitroGLYCERIN (NITROSTAT) 0.4 MG SL tablet Place 1 tablet (0.4 mg total) under the tongue every 5 (five) minutes x 3 doses as needed for chest pain.  . NON FORMULARY Diet Type:  NAS  . polyethylene glycol (MIRALAX / GLYCOLAX) 17 g packet Take 17 g by mouth  daily.  . potassium chloride (K-DUR) 10 MEQ tablet Take 10 mEq by mouth daily.  . tamsulosin (FLOMAX) 0.4 MG CAPS capsule Take 0.4 mg by mouth every evening.   No facility-administered encounter medications on file as of 09/05/2018.      SIGNIFICANT DIAGNOSTIC EXAMS  PREVIOUS:   07-29-18: chest x-ray:  1. No acute cardiopulmonary disease. 2. Stable post treatment changes in the upper left hemithorax. 3. Stable irregular nodular opacities in the mid and upper right lung, please see recent chest CT report 07/10/2018 for details.  NO NEW EXAMS.   LABS REVIEWED PREVIOUS:   07-26-18 wbc 13.8; hgb 13.9; hct 41.8; mcv 89.5 ;plt 139; glucose 143; bun 16; creat 0.93; k+ 3.9; na++ 138; ca 8.6 tsh 0.408 07-29-18: wbc 18.6; hgb 12.6; hct 38.7; mcv 90.0; plt 143  glucose 112; bun 23; creat 1.00; k+ 3.9; na++ 138; ca 8.6 urine culture: e-coli ESBL: cipro   Blood culture: enterobacteriaceae species  07-31-18: wbc 8.9; hgb 12.9; hct 38.5; mcv 89.3 ;plt 139; glucose 124; bun 29; creat 1.16; k+ 3.7; na++ 133; ca 8.2  07-31-18: urine culture: none 08-13-18: wbc 8.6; hgb 12.7; hct 39.4; mcv 90.4 ;plt 278; glucose 103; bun 19; creat 0.76; k+ 4.1; na++ 134; ca 8.0   NO NEW LABS.    Review of Systems  Constitutional: Negative for malaise/fatigue.  Respiratory: Negative for cough and shortness of breath.   Cardiovascular: Negative for chest pain, palpitations and leg swelling.  Gastrointestinal: Negative for abdominal pain, constipation and heartburn.  Musculoskeletal: Negative for back pain, joint pain and myalgias.  Skin: Negative.   Neurological: Positive for weakness. Negative for dizziness.       Bilateral lower extremity numbness and weakness   Psychiatric/Behavioral: The patient is not nervous/anxious.      Physical Exam Constitutional:      General: He is not in acute distress.    Appearance: He is well-developed. He is not diaphoretic.  Neck:     Musculoskeletal: Neck supple.     Thyroid:  No thyromegaly.  Cardiovascular:     Rate and Rhythm: Normal rate and regular rhythm.     Pulses: Normal pulses.     Heart sounds: Normal heart sounds.     Comments: Psychologist, forensic in place  Pulmonary:     Effort: Pulmonary effort is normal. No respiratory distress.     Breath sounds: Normal breath sounds.     Comments: History of left upper lobectomy and right upper lobe wedge resection  Abdominal:     General: Bowel sounds are normal. There is no distension.     Palpations: Abdomen is soft.     Tenderness: There is no abdominal tenderness.  Genitourinary:    Comments: Has foley  Musculoskeletal:     Right lower leg: Edema present.     Left lower leg: Edema present.     Comments: 2+ bilateral lower extremity edema  Is able to move all extremities He has bilateral lower extremity numbness  Has bilateral lower extremity weakness          Lymphadenopathy:     Cervical: No cervical adenopathy.  Skin:    General: Skin is warm and dry.  Neurological:     Mental Status: He is alert and oriented to person, place, and time.  Psychiatric:        Mood and Affect: Mood normal.     ASSESSMENT/ PLAN:  TODAY:   1. Urine retention: is stable has long term foley will continue flomax daily   2. Malignant neoplasm of hilus of lung unspecified laterality: is status pot surgical and radiation therapy will monitor   3. Hyperlipidemia; unspecified hyperlipidemia: is table will continue lipitor 20 mg daily   PREVIOUS  4. Chronic non-seasonal allergic rhinitis: is stable will monitor   5. Cauda equina syndrome: is  status post L4-L5 laminectomy is weak; will continue therapy as directed and will follow up with neurosurgery as indicated will monitor he continues to have bilateral lower extremity weakness and numbness   6. Coronary artery disease involving native coronary artery of native heart without angina pectoris: is stable will continue lopressor 25 mg daily and has prn ntg. Is on statin and  eliquis   7. Anxiety: is stable; will continue klonopin 1 mg three times daily has failed wean in June 2020.    8. Permanent atrial fibrillation/complete heart block: is status post pace maker: is stable will continue lopressor 25 mg daily for rate control and is on long term eliquis 5 mg twice daily   9. Chronic constipation: is stable will continue miralax daily    MD is aware of resident's narcotic use and is in agreement with current plan of care. We will attempt to wean resident as apropriate   Ok Edwards NP [facility] Adult Medicine  Contact [phone] Monday through Friday 8am- 5pm  After hours call [phone]

## 2018-09-12 ENCOUNTER — Encounter: Payer: Self-pay | Admitting: Adult Health

## 2018-09-13 NOTE — Telephone Encounter (Signed)
Spoke with Joseph Art, patient's nurse at the [facility]. She does not think monitor has been received yet. Advised it can take 7-10 business days. She requests a call back early next week.

## 2018-09-16 NOTE — Telephone Encounter (Signed)
Spoke w/ pt nurse. Monitor has not been received.   Called [employer] to get an ETA on pt home monitor. They stated that the monitor was delivered on 6/24 to [place], [place]. She belives the monitor may have been delivered to the hospital instead of the [facility]. Tracking number provided by [employer]: Tracking number [id number]  Called [facility] center back and LM for pt nurse to return call.   When the nurse calls back let her know that the monitor was delivered on 6/24 but it may have been delivered to the hospital instead of the [facility] center. If they can not find the monitor call us back so we can order a new monitor.

## 2018-09-19 NOTE — Telephone Encounter (Signed)
Spoke with Benjamin [name], pt's nurse at the [facility]. Benjamin [name] reports she spoke with someone from our office and a new monitor was ordered. Confirmed in [employer] that new monitor was ordered on 09/18/18. Per Benjamin [name], she was told it should arrive in 5-7 business days. Will f/u next week if transmission not received.

## 2018-09-24 ENCOUNTER — Ambulatory Visit (INDEPENDENT_AMBULATORY_CARE_PROVIDER_SITE_OTHER): Payer: Medicare Other | Admitting: *Deleted

## 2018-09-24 DIAGNOSIS — I442 Atrioventricular block, complete: Secondary | ICD-10-CM | POA: Diagnosis not present

## 2018-09-24 LAB — CUP PACEART REMOTE DEVICE CHECK
Battery Remaining Longevity: 106 mo
Battery Voltage: 3 V
Brady Statistic AP VP Percent: 0.02 %
Brady Statistic AP VS Percent: 91.05 %
Brady Statistic AS VP Percent: 0 %
Brady Statistic AS VS Percent: 8.93 %
Brady Statistic RA Percent Paced: 90.98 %
Brady Statistic RV Percent Paced: 0.03 %
Date Time Interrogation Session: 20200707001059
Implantable Lead Implant Date: 20180612
Implantable Lead Implant Date: 20180612
Implantable Lead Location: 753860
Implantable Lead Location: 753860
Implantable Lead Model: 3830
Implantable Lead Model: 5076
Implantable Pulse Generator Implant Date: 20180612
Lead Channel Impedance Value: 304 Ohm
Lead Channel Impedance Value: 361 Ohm
Lead Channel Impedance Value: 437 Ohm
Lead Channel Impedance Value: 437 Ohm
Lead Channel Sensing Intrinsic Amplitude: 18 mV
Lead Channel Sensing Intrinsic Amplitude: 18 mV
Lead Channel Sensing Intrinsic Amplitude: 2.75 mV
Lead Channel Sensing Intrinsic Amplitude: 2.75 mV
Lead Channel Setting Pacing Amplitude: 2 V
Lead Channel Setting Pacing Amplitude: 2 V
Lead Channel Setting Pacing Pulse Width: 0.4 ms
Lead Channel Setting Sensing Sensitivity: 0.9 mV

## 2018-09-24 NOTE — Telephone Encounter (Signed)
Transmission received 09-23-2018

## 2018-09-24 NOTE — Telephone Encounter (Signed)
Transmission added to schedule for processing.

## 2018-09-26 ENCOUNTER — Ambulatory Visit (HOSPITAL_COMMUNITY)
Admission: RE | Admit: 2018-09-26 | Discharge: 2018-09-26 | Disposition: A | Discharge status: Home / Self Care | Payer: Medicare Other | Source: Ambulatory Visit | Attending: Neurological Surgery | Admitting: Neurological Surgery

## 2018-09-26 DIAGNOSIS — M48062 Spinal stenosis, lumbar region with neurogenic claudication: Secondary | ICD-10-CM | POA: Diagnosis present

## 2018-10-03 ENCOUNTER — Encounter: Payer: Self-pay | Admitting: Adult Health

## 2018-10-03 ENCOUNTER — Non-Acute Institutional Stay (SKILLED_NURSING_FACILITY): Payer: Medicare Other | Admitting: Adult Health

## 2018-10-03 DIAGNOSIS — I2510 Atherosclerotic heart disease of native coronary artery without angina pectoris: Secondary | ICD-10-CM | POA: Diagnosis not present

## 2018-10-03 DIAGNOSIS — G834 Cauda equina syndrome: Secondary | ICD-10-CM | POA: Diagnosis not present

## 2018-10-03 DIAGNOSIS — F419 Anxiety disorder, unspecified: Secondary | ICD-10-CM | POA: Diagnosis not present

## 2018-10-03 NOTE — Progress Notes (Signed)
Location:   [facility] Room Number: 127 D Place of Service:  SNF (31)   CODE STATUS: Full Code  Allergies  Allergen Reactions  . Codeine Other (See Comments)    Dizziness, stomach pain    Chief Complaint  Patient presents with  . Medical Management of Chronic Issues       Cauda equina syndrome:  Coronary artery disease involving native coronary of native heart without angina pectoris:  Anxiety     HPI:  He is a 73 year old long term resident of this facility being seen for the management of his chronic illnesses: cauda equina syndrome; cad; anxiety. He denies any uncontrolled pain; no changes in his appetite; he continues to have bilateral lower extremity weakness and numbness.   Past Medical History:  Diagnosis Date  . Adenopathy    RIGHT ADRENAL  . Anxiety   . Arthritis    KNEES/HANDS  . CAD (coronary artery disease)    a. Prior known CTO of LCX with recanalizationby cath 2008;  b. 09/2012 NSTEMI/Cath/PCI: LM nl, LAD 49m, D1 50p, LCX 100p (2.75x16 Promus Premier DES), 80-60m, OM1 30, RCA 52m (3.0x16 Promus Premier DES), EF 50-55%.  [name] History of echocardiogram    Echo 2/18: Moderate LVH, EF 55-60, normal wall motion, trivial AI, moderate LAE, mild RVE, mild RAE, mildly elevated pulmonary pressure  . Hx of melanoma of skin   . 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 Malignant neoplasm of hilus of lung (HCC)    NON-SMALL-CELL    . Non-small cell lung cancer ([place])    a. s/p resection and XRT.  [name] PAF (paroxysmal atrial fibrillation) ([place])    a. not currently on anticoagulation (09/2012)  . Presence of permanent cardiac pacemaker   . 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 Uses walker     Past Surgical History:  Procedure Laterality Date  . ADRENALEXTOMY  02/2000  . CARDIAC CATHETERIZATION  2008   LAD 40, D1 OK, D2 50, CFX 90, RCA OK, EF 60%; Consider PCI PRN but the stenosis is quite complex and we would end up jailing several  large posterolateral branches    . HERNIA REPAIR  05/27/2018  . INGUINAL HERNIA REPAIR Right 05/27/2018   Procedure: INCARCERATED RIGHT INGUINAL HERNIA REPAIR;  Surgeon: Erroll Luna, MD;  Location: [place];  Service: General;  Laterality: Right;  . LEFT HEART CATHETERIZATION WITH CORONARY ANGIOGRAM N/A 10/17/2012   Procedure: LEFT HEART CATHETERIZATION WITH CORONARY ANGIOGRAM;  Surgeon: Sherren Mocha, MD;  Location: [facility] CATH LAB;  Service: Cardiovascular;  Laterality: N/A;  . LOBECTOMY LEFT UPPER LOBE  08/15/2001  . LUMBAR LAMINECTOMY/DECOMPRESSION MICRODISCECTOMY N/A 07/25/2018   Procedure: Lumbar Four-Five Laminectomy;  Surgeon: Kristeen Miss, MD;  Location: [place];  Service: Neurosurgery;  Laterality: N/A;  Lumbar Four-Five Laminectomy  . PACEMAKER IMPLANT N/A 08/29/2016   Procedure: Pacemaker Implant;  Surgeon: Evans Lance, MD;  Location: [place] CV LAB;  Service: Cardiovascular;  Laterality: N/A;  . Right VATS, mini thoracotomy, wedge resection of right upper  01/18/2004    Social History   Socioeconomic History  . Marital status: Divorced    Spouse name: Not on file  . Number of children: Not on file  . Years of education: Not on file  . Highest education level: Not on file  Occupational History  . Occupation: Retired    Fish farm manager: [employer]  . Financial resource strain: Not on  file  . Food insecurity    Worry: Not on file    Inability: Not on file  . Transportation needs    Medical: Not on file    Non-medical: Not on file  Tobacco Use  . Smoking status: Former Smoker    Packs/day: 1.00    Years: 40.00    Pack years: 40.00    Types: Cigarettes    Quit date: 08/15/2001    Years since quitting: 17.1  . Smokeless tobacco: Never Used  Substance and Sexual Activity  . Alcohol use: No  . Drug use: No  . Sexual activity: Not Currently  Lifestyle  . Physical activity    Days per week: Not on file    Minutes per session: Not on file  . Stress: Not  on file  Relationships  . Social Herbalist on phone: Not on file    Gets together: Not on file    Attends religious service: Not on file    Active member of club or organization: Not on file    Attends meetings of clubs or organizations: Not on file    Relationship status: Not on file  . Intimate partner violence    Fear of current or ex partner: Not on file    Emotionally abused: Not on file    Physically abused: Not on file    Forced sexual activity: Not on file  Other Topics Concern  . Not on file  Social History Narrative  . Not on file   Family History  Problem Relation Age of Onset  . Heart attack Father   . Cancer Mother        lung  . Cancer Cousin        breast      VITAL SIGNS BP 123/74   Pulse 78   Temp (!) 96.8 F (36 C)   Resp 20   Ht 5\' 11"  (1.803 m)   Wt 192 lb 9.6 oz (87.4 kg)   BMI 26.86 kg/m   Outpatient Encounter Medications as of 10/03/2018  Medication Sig  . acetaminophen (TYLENOL) 325 MG tablet Take 2 tablets (650 mg total) by mouth every 6 (six) hours as needed for mild pain (or temp > 100).  [name] apixaban (ELIQUIS) 5 MG TABS tablet Take 5 mg by mouth 2 (two) times daily.  [name] atorvastatin (LIPITOR) 20 MG tablet Take 20 mg by mouth daily.   Roseanne Kaufman Peru-Castor Oil (VENELEX) OINT Apply topically to sacrum every shift until resolved  . clonazePAM (KLONOPIN) 1 MG tablet Take 1 tablet (1 mg total) by mouth 3 (three) times daily.  . furosemide (LASIX) 40 MG tablet Take 40 mg by mouth daily.  . magnesium hydroxide (MILK OF MAGNESIA) 400 MG/5ML suspension Take 30 mLs by mouth daily as needed for mild constipation.  . metoprolol tartrate (LOPRESSOR) 25 MG tablet Take 25 mg by mouth daily.  . nitroGLYCERIN (NITROSTAT) 0.4 MG SL tablet Place 1 tablet (0.4 mg total) under the tongue every 5 (five) minutes x 3 doses as needed for chest pain.  . NON FORMULARY Diet Type:  NAS  . polyethylene glycol (MIRALAX / GLYCOLAX) 17 g packet Take 17 g by mouth  daily.  . potassium chloride (K-DUR) 10 MEQ tablet Take 10 mEq by mouth daily.  . tamsulosin (FLOMAX) 0.4 MG CAPS capsule Take 0.4 mg by mouth every evening.   No facility-administered encounter medications on file as of 10/03/2018.      SIGNIFICANT  DIAGNOSTIC EXAMS  PREVIOUS:   07-29-18: chest x-ray:  1. No acute cardiopulmonary disease. 2. Stable post treatment changes in the upper left hemithorax. 3. Stable irregular nodular opacities in the mid and upper right lung, please see recent chest CT report 07/10/2018 for details.  NO NEW EXAMS.   LABS REVIEWED PREVIOUS:   07-26-18 wbc 13.8; hgb 13.9; hct 41.8; mcv 89.5 ;plt 139; glucose 143; bun 16; creat 0.93; k+ 3.9; na++ 138; ca 8.6 tsh 0.408 07-29-18: wbc 18.6; hgb 12.6; hct 38.7; mcv 90.0; plt 143  glucose 112; bun 23; creat 1.00; k+ 3.9; na++ 138; ca 8.6 urine culture: e-coli ESBL: cipro   Blood culture: enterobacteriaceae species  07-31-18: wbc 8.9; hgb 12.9; hct 38.5; mcv 89.3 ;plt 139; glucose 124; bun 29; creat 1.16; k+ 3.7; na++ 133; ca 8.2  07-31-18: urine culture: none 08-13-18: wbc 8.6; hgb 12.7; hct 39.4; mcv 90.4 ;plt 278; glucose 103; bun 19; creat 0.76; k+ 4.1; na++ 134; ca 8.0   TODAY:   09-10-18: glucose 99; bun 19; creat 0.86 ;k+ 3.7; na++ 136; ca 8.3     Review of Systems  Constitutional: Negative for malaise/fatigue.  Respiratory: Negative for cough and shortness of breath.   Cardiovascular: Negative for chest pain, palpitations and leg swelling.  Gastrointestinal: Negative for abdominal pain, constipation and heartburn.  Musculoskeletal: Negative for back pain, joint pain and myalgias.  Skin: Negative.   Neurological: Positive for weakness. Negative for dizziness.        Bilateral lower extremity numbness and weakness    Psychiatric/Behavioral: The patient is not nervous/anxious.     Physical Exam Constitutional:      General: He is not in acute distress.    Appearance: He is well-developed. He is not  diaphoretic.  Neck:     Musculoskeletal: Neck supple.     Thyroid: No thyromegaly.  Cardiovascular:     Rate and Rhythm: Normal rate and regular rhythm.     Pulses: Normal pulses.     Heart sounds: Normal heart sounds.     Comments: Pace maker  Pulmonary:     Effort: Pulmonary effort is normal. No respiratory distress.     Breath sounds: Normal breath sounds.     Comments: History of left upper lobectomy and right upper lobe wedge resection  Abdominal:     General: Bowel sounds are normal. There is no distension.     Palpations: Abdomen is soft.     Tenderness: There is no abdominal tenderness.  Genitourinary:    Comments: Has foley  Musculoskeletal:     Right lower leg: Edema present.     Left lower leg: Edema present.     Comments: 2+ bilateral lower extremity edema  Is able to move all extremities He has bilateral lower extremity numbness  Has bilateral lower extremity weakness      Lymphadenopathy:     Cervical: No cervical adenopathy.  Skin:    General: Skin is warm and dry.  Neurological:     Mental Status: He is alert and oriented to person, place, and time.  Psychiatric:        Mood and Affect: Mood normal.      ASSESSMENT/ PLAN:  TODAY:   1. Cauda equina syndrome: is status post L4-L5 laminectomy is without change  he continues to have bilateral lower extremity weakness and numbness.   2. Coronary artery disease involving native coronary of native heart without angina pectoris: is stable will continue lopressor 25 mg daily has  prn ntg; is on statin and eliquis   3. Anxiety is stable will continue klonopin 1 mg three times daily failed wean in June 2020.   PREVIOUS  4. Chronic non-seasonal allergic rhinitis: is stable will monitor   5. Permanent atrial fibrillation/complete heart block: is status post pace maker: is stable will continue lopressor 25 mg daily for rate control and is on long term eliquis 5 mg twice daily   6. Chronic constipation: is stable  will continue miralax daily   7. Urine retention: is stable has long term foley will continue flomax daily   8. Malignant neoplasm of hilus of lung unspecified laterality: is status pot surgical and radiation therapy will monitor   9. Hyperlipidemia; unspecified hyperlipidemia: is table will continue lipitor 20 mg daily   Will check lipids; hep c panel and will guaiac stool X 2    MD is aware of resident's narcotic use and is in agreement with current plan of care. We will attempt to wean resident as apropriate   Ok Edwards NP [facility]  Contact [phone] Monday through Friday 8am- 5pm  After hours call [phone]

## 2018-10-04 ENCOUNTER — Encounter: Payer: Self-pay | Admitting: Cardiology

## 2018-10-04 NOTE — Progress Notes (Signed)
Remote pacemaker transmission.   

## 2018-10-05 ENCOUNTER — Encounter (HOSPITAL_COMMUNITY)
Admission: AD | Admit: 2018-10-05 | Discharge: 2018-10-05 | Disposition: A | Discharge status: Home / Self Care | Payer: Medicare Other | Source: Skilled Nursing Facility | Attending: Adult Health | Admitting: Adult Health

## 2018-10-05 DIAGNOSIS — B182 Chronic viral hepatitis C: Secondary | ICD-10-CM | POA: Insufficient documentation

## 2018-10-05 DIAGNOSIS — R195 Other fecal abnormalities: Secondary | ICD-10-CM | POA: Diagnosis not present

## 2018-10-05 DIAGNOSIS — R192 Visible peristalsis: Secondary | ICD-10-CM | POA: Diagnosis not present

## 2018-10-05 LAB — OCCULT BLOOD X 1 CARD TO LAB, STOOL: Fecal Occult Bld: NEGATIVE

## 2018-10-06 ENCOUNTER — Encounter (HOSPITAL_COMMUNITY)
Admission: AD | Admit: 2018-10-06 | Discharge: 2018-10-06 | Disposition: A | Discharge status: Home / Self Care | Payer: Medicare Other | Source: Skilled Nursing Facility

## 2018-10-06 DIAGNOSIS — R195 Other fecal abnormalities: Secondary | ICD-10-CM | POA: Diagnosis not present

## 2018-10-07 ENCOUNTER — Encounter (HOSPITAL_COMMUNITY)
Admission: RE | Admit: 2018-10-07 | Discharge: 2018-10-07 | Disposition: A | Discharge status: Home / Self Care | Payer: Medicare Other | Source: Skilled Nursing Facility | Attending: Adult Health | Admitting: Adult Health

## 2018-10-07 DIAGNOSIS — R195 Other fecal abnormalities: Secondary | ICD-10-CM | POA: Diagnosis not present

## 2018-10-07 LAB — LIPID PANEL
Cholesterol: 116 mg/dL (ref 0–200)
HDL: 38 mg/dL — ABNORMAL LOW (ref >40)
LDL Cholesterol: 59 mg/dL (ref 0–99)
Total CHOL/HDL Ratio: 3.1 RATIO
Triglycerides: 95 mg/dL (ref <150)
VLDL: 19 mg/dL (ref 0–40)

## 2018-10-07 LAB — OCCULT BLOOD X 1 CARD TO LAB, STOOL: Fecal Occult Bld: POSITIVE — AB

## 2018-10-08 ENCOUNTER — Encounter (HOSPITAL_COMMUNITY)
Admission: AD | Admit: 2018-10-08 | Discharge: 2018-10-08 | Disposition: A | Discharge status: Home / Self Care | Payer: Medicare Other | Source: Skilled Nursing Facility

## 2018-10-08 DIAGNOSIS — R195 Other fecal abnormalities: Secondary | ICD-10-CM | POA: Diagnosis not present

## 2018-10-09 LAB — HEPATITIS C ANTIBODY: HCV Ab: 0.4 s/co ratio (ref 0.0–0.9)

## 2018-10-17 ENCOUNTER — Other Ambulatory Visit (HOSPITAL_COMMUNITY)
Admission: AD | Admit: 2018-10-17 | Discharge: 2018-10-17 | Disposition: A | Discharge status: Home / Self Care | Payer: Medicare Other | Source: Skilled Nursing Facility | Attending: Adult Health | Admitting: Adult Health

## 2018-10-17 DIAGNOSIS — K625 Hemorrhage of anus and rectum: Secondary | ICD-10-CM | POA: Insufficient documentation

## 2018-10-18 LAB — OCCULT BLOOD X 1 CARD TO LAB, STOOL: Fecal Occult Bld: NEGATIVE

## 2018-10-21 ENCOUNTER — Ambulatory Visit: Payer: Medicare Other | Admitting: Cardiovascular Disease

## 2018-10-29 ENCOUNTER — Encounter: Payer: Self-pay | Admitting: Adult Health

## 2018-10-29 ENCOUNTER — Non-Acute Institutional Stay (SKILLED_NURSING_FACILITY): Payer: Medicare Other | Admitting: Adult Health

## 2018-10-29 DIAGNOSIS — K5909 Other constipation: Secondary | ICD-10-CM

## 2018-10-29 DIAGNOSIS — R339 Retention of urine, unspecified: Secondary | ICD-10-CM | POA: Diagnosis not present

## 2018-10-29 DIAGNOSIS — I4821 Permanent atrial fibrillation: Secondary | ICD-10-CM | POA: Diagnosis not present

## 2018-10-29 DIAGNOSIS — I442 Atrioventricular block, complete: Secondary | ICD-10-CM | POA: Diagnosis not present

## 2018-10-29 NOTE — Progress Notes (Signed)
Location:   [facility] Room Number: 127 D Place of Service:  SNF (31)   CODE STATUS: Full Code  Allergies  Allergen Reactions  . Codeine Other (See Comments)    Dizziness, stomach pain    Chief Complaint  Patient presents with  . Medical Management of Chronic Issues         Permanent atrial fibrillation/complete heart block Chronic constipation:  Urine retention:    HPI:  He is a 73 year old long term resident of this facility being seen for the management of his chronic illnesses; afib; complete heart block; constipation urine retention. He denies any uncontrolled pain; no constipation; no changes in his appetite.   Past Medical History:  Diagnosis Date  . Adenopathy    RIGHT ADRENAL  . Anxiety   . Arthritis    KNEES/HANDS  . CAD (coronary artery disease)    a. Prior known CTO of LCX with recanalizationby cath 2008;  b. 09/2012 NSTEMI/Cath/PCI: LM nl, LAD 72m, D1 50p, LCX 100p (2.75x16 Promus Premier DES), 80-14m, OM1 30, RCA 59m (3.0x16 Promus Premier DES), EF 50-55%.  [name] History of echocardiogram    Echo 2/18: Moderate LVH, EF 55-60, normal wall motion, trivial AI, moderate LAE, mild RVE, mild RAE, mildly elevated pulmonary pressure  . Hx of melanoma of skin   . 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 Malignant neoplasm of hilus of lung (HCC)    NON-SMALL-CELL    . Non-small cell lung cancer ([place])    a. s/p resection and XRT.  [name] PAF (paroxysmal atrial fibrillation) ([place])    a. not currently on anticoagulation (09/2012)  . Presence of permanent cardiac pacemaker   . 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 Uses walker     Past Surgical History:  Procedure Laterality Date  . ADRENALEXTOMY  02/2000  . CARDIAC CATHETERIZATION  2008   LAD 40, D1 OK, D2 50, CFX 90, RCA OK, EF 60%; Consider PCI PRN but the stenosis is quite complex and we would end up jailing several large posterolateral branches    . HERNIA REPAIR  05/27/2018   . INGUINAL HERNIA REPAIR Right 05/27/2018   Procedure: INCARCERATED RIGHT INGUINAL HERNIA REPAIR;  Surgeon: Erroll Luna, MD;  Location: [place];  Service: General;  Laterality: Right;  . LEFT HEART CATHETERIZATION WITH CORONARY ANGIOGRAM N/A 10/17/2012   Procedure: LEFT HEART CATHETERIZATION WITH CORONARY ANGIOGRAM;  Surgeon: Sherren Mocha, MD;  Location: [facility] CATH LAB;  Service: Cardiovascular;  Laterality: N/A;  . LOBECTOMY LEFT UPPER LOBE  08/15/2001  . LUMBAR LAMINECTOMY/DECOMPRESSION MICRODISCECTOMY N/A 07/25/2018   Procedure: Lumbar Four-Five Laminectomy;  Surgeon: Kristeen Miss, MD;  Location: [place];  Service: Neurosurgery;  Laterality: N/A;  Lumbar Four-Five Laminectomy  . PACEMAKER IMPLANT N/A 08/29/2016   Procedure: Pacemaker Implant;  Surgeon: Evans Lance, MD;  Location: [place] CV LAB;  Service: Cardiovascular;  Laterality: N/A;  . Right VATS, mini thoracotomy, wedge resection of right upper  01/18/2004    Social History   Socioeconomic History  . Marital status: Divorced    Spouse name: Not on file  . Number of children: Not on file  . Years of education: Not on file  . Highest education level: Not on file  Occupational History  . Occupation: Retired    Fish farm manager: [employer]  . Financial resource strain: Not on file  . Food insecurity    Worry: Not on file  Inability: Not on file  . Transportation needs    Medical: Not on file    Non-medical: Not on file  Tobacco Use  . Smoking status: Former Smoker    Packs/day: 1.00    Years: 40.00    Pack years: 40.00    Types: Cigarettes    Quit date: 08/15/2001    Years since quitting: 17.2  . Smokeless tobacco: Never Used  Substance and Sexual Activity  . Alcohol use: No  . Drug use: No  . Sexual activity: Not Currently  Lifestyle  . Physical activity    Days per week: Not on file    Minutes per session: Not on file  . Stress: Not on file  Relationships  . Social Herbalist on  phone: Not on file    Gets together: Not on file    Attends religious service: Not on file    Active member of club or organization: Not on file    Attends meetings of clubs or organizations: Not on file    Relationship status: Not on file  . Intimate partner violence    Fear of current or ex partner: Not on file    Emotionally abused: Not on file    Physically abused: Not on file    Forced sexual activity: Not on file  Other Topics Concern  . Not on file  Social History Narrative  . Not on file   Family History  Problem Relation Age of Onset  . Heart attack Father   . Cancer Mother        lung  . Cancer Cousin        breast      VITAL SIGNS BP 121/79   Pulse (!) 59   Temp 97.7 F (36.5 C)   Resp 20   Ht 5\' 11"  (1.803 m)   Wt 198 lb 6.4 oz (90 kg)   BMI 27.67 kg/m   Outpatient Encounter Medications as of 10/29/2018  Medication Sig  . acetaminophen (TYLENOL) 325 MG tablet Take 2 tablets (650 mg total) by mouth every 6 (six) hours as needed for mild pain (or temp > 100).  [name] apixaban (ELIQUIS) 5 MG TABS tablet Take 5 mg by mouth 2 (two) times daily.  [name] atorvastatin (LIPITOR) 20 MG tablet Take 20 mg by mouth daily.   Roseanne Kaufman Peru-Castor Oil (VENELEX) OINT Apply topically to sacrum every shift until resolved  . clonazePAM (KLONOPIN) 1 MG tablet Take 1 tablet (1 mg total) by mouth 3 (three) times daily.  . furosemide (LASIX) 40 MG tablet Take 40 mg by mouth daily.  . magnesium hydroxide (MILK OF MAGNESIA) 400 MG/5ML suspension Take 30 mLs by mouth daily as needed for mild constipation.  . metoprolol tartrate (LOPRESSOR) 25 MG tablet Take 25 mg by mouth daily.  . nitroGLYCERIN (NITROSTAT) 0.4 MG SL tablet Place 1 tablet (0.4 mg total) under the tongue every 5 (five) minutes x 3 doses as needed for chest pain.  . NON FORMULARY Diet Type:  NAS  . polyethylene glycol (MIRALAX / GLYCOLAX) 17 g packet Take 17 g by mouth daily.  . potassium chloride (K-DUR) 10 MEQ tablet Take 10  mEq by mouth daily.  . tamsulosin (FLOMAX) 0.4 MG CAPS capsule Take 0.4 mg by mouth every evening.   No facility-administered encounter medications on file as of 10/29/2018.      SIGNIFICANT DIAGNOSTIC EXAMS  PREVIOUS:   07-29-18: chest x-ray:  1. No acute cardiopulmonary disease.  2. Stable post treatment changes in the upper left hemithorax. 3. Stable irregular nodular opacities in the mid and upper right lung, please see recent chest CT report 07/10/2018 for details.  NO NEW EXAMS.   LABS REVIEWED PREVIOUS:   07-26-18 wbc 13.8; hgb 13.9; hct 41.8; mcv 89.5 ;plt 139; glucose 143; bun 16; creat 0.93; k+ 3.9; na++ 138; ca 8.6 tsh 0.408 07-29-18: wbc 18.6; hgb 12.6; hct 38.7; mcv 90.0; plt 143  glucose 112; bun 23; creat 1.00; k+ 3.9; na++ 138; ca 8.6 urine culture: e-coli ESBL: cipro   Blood culture: enterobacteriaceae species  07-31-18: wbc 8.9; hgb 12.9; hct 38.5; mcv 89.3 ;plt 139; glucose 124; bun 29; creat 1.16; k+ 3.7; na++ 133; ca 8.2  07-31-18: urine culture: none 08-13-18: wbc 8.6; hgb 12.7; hct 39.4; mcv 90.4 ;plt 278; glucose 103; bun 19; creat 0.76; k+ 4.1; na++ 134; ca 8.0  09-10-18: glucose 99; bun 19; creat 0.86 ;k+ 3.7; na++ 136; ca 8.3   LABS REVIEWED  10-05-18: guaiac neg 10-06-27: guaiac positive 10-07-18: chol 116; bun 59; trig 95; hdl 38 10-08-18: hep C neg 10-17-18: guaiac neg    Review of Systems  Constitutional: Negative for malaise/fatigue.  Respiratory: Negative for cough and shortness of breath.   Cardiovascular: Negative for chest pain, palpitations and leg swelling.  Gastrointestinal: Negative for abdominal pain, constipation and heartburn.  Musculoskeletal: Negative for back pain, joint pain and myalgias.  Skin: Negative.   Neurological: Negative for dizziness.       Bilateral lower extremity numbness and weakness  Psychiatric/Behavioral: The patient is not nervous/anxious.     Physical Exam Constitutional:      General: He is not in acute distress.     Appearance: He is well-developed. He is not diaphoretic.  Neck:     Musculoskeletal: Neck supple.     Thyroid: No thyromegaly.  Cardiovascular:     Rate and Rhythm: Normal rate and regular rhythm.     Pulses: Normal pulses.     Heart sounds: Normal heart sounds.     Comments: Pace maker Pulmonary:     Effort: Pulmonary effort is normal. No respiratory distress.     Breath sounds: Normal breath sounds.  Abdominal:     General: Bowel sounds are normal. There is no distension.     Palpations: Abdomen is soft.     Tenderness: There is no abdominal tenderness.  Genitourinary:    Comments: foley Musculoskeletal:     Right lower leg: Edema present.     Left lower leg: Edema present.     Comments: 2+ bilateral lower extremity edema  Is able to move all extremities He has bilateral lower extremity numbness  Has bilateral lower extremity weakness       Lymphadenopathy:     Cervical: No cervical adenopathy.  Skin:    General: Skin is warm and dry.  Neurological:     Mental Status: He is alert and oriented to person, place, and time.  Psychiatric:        Mood and Affect: Mood normal.       ASSESSMENT/ PLAN:  TODAY:   1. Permanent atrial fibrillation/complete heart block is status post pace maker: is stable will continue lopressor 25 mg daily for rate control is on long term eliquis 5 mg twice daily   2. Chronic constipation: is stable will continue miralax daily   3. Urine retention: is stable has long term foley will continue flomax daily   PREVIOUS  4. Chronic  non-seasonal allergic rhinitis: is stable will monitor   5. Malignant neoplasm of hilus of lung unspecified laterality: is status pot surgical and radiation therapy will monitor   6. Hyperlipidemia; unspecified hyperlipidemia: is stable LDL 59 will continue lipitor 20 mg daily   7. Bilateral lower extremity edema: is stable will continue lasix 40 mg daily with k+ 10 meq daily   8. Cauda equina syndrome: is  status post L4-L5 laminectomy is without change  he continues to have bilateral lower extremity weakness and numbness.   9. Coronary artery disease involving native coronary of native heart without angina pectoris: is stable will continue lopressor 25 mg daily has prn ntg; is on statin and eliquis   10. Anxiety is stable will continue klonopin 1 mg three times daily failed wean in June 2020.     MD is aware of resident's narcotic use and is in agreement with current plan of care. We will attempt to wean resident as apropriate   Ok Edwards NP [facility]  Contact [phone] Monday through Friday 8am- 5pm  After hours call [phone]

## 2018-11-04 ENCOUNTER — Other Ambulatory Visit: Payer: Self-pay | Admitting: Adult Health

## 2018-11-04 MED ORDER — CLONAZEPAM 1 MG PO TABS: 1.0000 mg | ORAL_TABLET | Freq: Three times a day (TID) | ORAL | 0 refills | Status: DC

## 2018-11-11 ENCOUNTER — Encounter: Payer: Self-pay | Admitting: Adult Health

## 2018-11-11 ENCOUNTER — Non-Acute Institutional Stay (SKILLED_NURSING_FACILITY): Payer: Medicare Other | Admitting: Adult Health

## 2018-11-11 DIAGNOSIS — R600 Localized edema: Secondary | ICD-10-CM | POA: Diagnosis not present

## 2018-11-11 NOTE — Progress Notes (Signed)
Location:    [facility] Room Number: 127/D Place of Service:  SNF (31)   CODE STATUS: Full Code  Allergies  Allergen Reactions  . Codeine Other (See Comments)    Dizziness, stomach pain    Chief Complaint  Patient presents with  . Acute Visit    Edema    HPI:  He is complaining of worsening lower extremity edema. He is presently using ace wraps to his lower extremities to manage his edema. He is taking diuretics without relief. He denies any shortness of breath; no chest pain; insomnia.   Past Medical History:  Diagnosis Date  . Adenopathy    RIGHT ADRENAL  . Anxiety   . Arthritis    KNEES/HANDS  . CAD (coronary artery disease)    a. Prior known CTO of LCX with recanalizationby cath 2008;  b. 09/2012 NSTEMI/Cath/PCI: LM nl, LAD 64m, D1 50p, LCX 100p (2.75x16 Promus Premier DES), 80-14m, OM1 30, RCA 42m (3.0x16 Promus Premier DES), EF 50-55%.  [name] History of echocardiogram    Echo 2/18: Moderate LVH, EF 55-60, normal wall motion, trivial AI, moderate LAE, mild RVE, mild RAE, mildly elevated pulmonary pressure  . Hx of melanoma of skin   . 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 Malignant neoplasm of hilus of lung (HCC)    NON-SMALL-CELL    . Non-small cell lung cancer ([place])    a. s/p resection and XRT.  [name] PAF (paroxysmal atrial fibrillation) ([place])    a. not currently on anticoagulation (09/2012)  . Presence of permanent cardiac pacemaker   . 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 Uses walker     Past Surgical History:  Procedure Laterality Date  . ADRENALEXTOMY  02/2000  . CARDIAC CATHETERIZATION  2008   LAD 40, D1 OK, D2 50, CFX 90, RCA OK, EF 60%; Consider PCI PRN but the stenosis is quite complex and we would end up jailing several large posterolateral branches    . HERNIA REPAIR  05/27/2018  . INGUINAL HERNIA REPAIR Right 05/27/2018   Procedure: INCARCERATED RIGHT INGUINAL HERNIA REPAIR;  Surgeon: Erroll Luna, MD;   Location: [place];  Service: General;  Laterality: Right;  . LEFT HEART CATHETERIZATION WITH CORONARY ANGIOGRAM N/A 10/17/2012   Procedure: LEFT HEART CATHETERIZATION WITH CORONARY ANGIOGRAM;  Surgeon: Sherren Mocha, MD;  Location: [facility] CATH LAB;  Service: Cardiovascular;  Laterality: N/A;  . LOBECTOMY LEFT UPPER LOBE  08/15/2001  . LUMBAR LAMINECTOMY/DECOMPRESSION MICRODISCECTOMY N/A 07/25/2018   Procedure: Lumbar Four-Five Laminectomy;  Surgeon: Kristeen Miss, MD;  Location: [place];  Service: Neurosurgery;  Laterality: N/A;  Lumbar Four-Five Laminectomy  . PACEMAKER IMPLANT N/A 08/29/2016   Procedure: Pacemaker Implant;  Surgeon: Evans Lance, MD;  Location: [place] CV LAB;  Service: Cardiovascular;  Laterality: N/A;  . Right VATS, mini thoracotomy, wedge resection of right upper  01/18/2004    Social History   Socioeconomic History  . Marital status: Divorced    Spouse name: Not on file  . Number of children: Not on file  . Years of education: Not on file  . Highest education level: Not on file  Occupational History  . Occupation: Retired    Fish farm manager: [employer]  . Financial resource strain: Not on file  . Food insecurity    Worry: Not on file    Inability: Not on file  . Transportation needs    Medical: Not on file  Non-medical: Not on file  Tobacco Use  . Smoking status: Former Smoker    Packs/day: 1.00    Years: 40.00    Pack years: 40.00    Types: Cigarettes    Quit date: 08/15/2001    Years since quitting: 17.2  . Smokeless tobacco: Never Used  Substance and Sexual Activity  . Alcohol use: No  . Drug use: No  . Sexual activity: Not Currently  Lifestyle  . Physical activity    Days per week: Not on file    Minutes per session: Not on file  . Stress: Not on file  Relationships  . Social Herbalist on phone: Not on file    Gets together: Not on file    Attends religious service: Not on file    Active member of club or  organization: Not on file    Attends meetings of clubs or organizations: Not on file    Relationship status: Not on file  . Intimate partner violence    Fear of current or ex partner: Not on file    Emotionally abused: Not on file    Physically abused: Not on file    Forced sexual activity: Not on file  Other Topics Concern  . Not on file  Social History Narrative  . Not on file   Family History  Problem Relation Age of Onset  . Heart attack Father   . Cancer Mother        lung  . Cancer Cousin        breast      VITAL SIGNS BP 125/82   Pulse 63   Temp (!) 97 F (36.1 C) (Oral)   Resp 18   Ht 5\' 11"  (1.803 m)   Wt 198 lb 6.4 oz (90 kg)   SpO2 94%   BMI 27.67 kg/m   Outpatient Encounter Medications as of 11/11/2018  Medication Sig  . acetaminophen (TYLENOL) 325 MG tablet Take 2 tablets (650 mg total) by mouth every 6 (six) hours as needed for mild pain (or temp > 100).  [name] apixaban (ELIQUIS) 5 MG TABS tablet Take 5 mg by mouth 2 (two) times daily.  [name] atorvastatin (LIPITOR) 20 MG tablet Take 20 mg by mouth daily.   Roseanne Kaufman Peru-Castor Oil (VENELEX) OINT Apply topically to sacrum every shift until resolved  . clonazePAM (KLONOPIN) 1 MG tablet Take 1 tablet (1 mg total) by mouth 3 (three) times daily.  . furosemide (LASIX) 40 MG tablet Take 40 mg by mouth daily.  . magnesium hydroxide (MILK OF MAGNESIA) 400 MG/5ML suspension Take 30 mLs by mouth daily as needed for mild constipation.  . metoprolol tartrate (LOPRESSOR) 25 MG tablet Take 25 mg by mouth daily.  . nitroGLYCERIN (NITROSTAT) 0.4 MG SL tablet Place 1 tablet (0.4 mg total) under the tongue every 5 (five) minutes x 3 doses as needed for chest pain.  . NON FORMULARY Diet Type: Regular,  NAS, Consistent Carbohydrate  . polyethylene glycol (MIRALAX / GLYCOLAX) 17 g packet Take 17 g by mouth daily.  [name] spironolactone (ALDACTONE) 25 MG tablet Take 25 mg by mouth daily.  . tamsulosin (FLOMAX) 0.4 MG CAPS capsule Take 0.4  mg by mouth every evening.  . [DISCONTINUED] potassium chloride (K-DUR) 10 MEQ tablet Take 10 mEq by mouth daily.   No facility-administered encounter medications on file as of 11/11/2018.      SIGNIFICANT DIAGNOSTIC EXAMS   PREVIOUS:   07-29-18: chest x-ray:  1. No acute cardiopulmonary disease. 2. Stable post treatment changes in the upper left hemithorax. 3. Stable irregular nodular opacities in the mid and upper right lung, please see recent chest CT report 07/10/2018 for details.  NO NEW EXAMS.   LABS REVIEWED PREVIOUS:   07-26-18 wbc 13.8; hgb 13.9; hct 41.8; mcv 89.5 ;plt 139; glucose 143; bun 16; creat 0.93; k+ 3.9; na++ 138; ca 8.6 tsh 0.408 07-29-18: wbc 18.6; hgb 12.6; hct 38.7; mcv 90.0; plt 143  glucose 112; bun 23; creat 1.00; k+ 3.9; na++ 138; ca 8.6 urine culture: e-coli ESBL: cipro   Blood culture: enterobacteriaceae species  07-31-18: wbc 8.9; hgb 12.9; hct 38.5; mcv 89.3 ;plt 139; glucose 124; bun 29; creat 1.16; k+ 3.7; na++ 133; ca 8.2  07-31-18: urine culture: none 08-13-18: wbc 8.6; hgb 12.7; hct 39.4; mcv 90.4 ;plt 278; glucose 103; bun 19; creat 0.76; k+ 4.1; na++ 134; ca 8.0  09-10-18: glucose 99; bun 19; creat 0.86 ;k+ 3.7; na++ 136; ca 8.3  10-05-18: guaiac neg 10-06-27: guaiac positive 10-07-18: chol 116; bun 59; trig 95; hdl 38 10-08-18: hep C neg 10-17-18: guaiac neg   NO NEW LABS.   Review of Systems  Constitutional: Negative for malaise/fatigue.  Respiratory: Negative for cough and shortness of breath.   Cardiovascular: Positive for leg swelling. Negative for chest pain and palpitations.  Gastrointestinal: Negative for abdominal pain, constipation and heartburn.  Musculoskeletal: Negative for back pain, joint pain and myalgias.  Skin: Negative.   Neurological: Negative for dizziness.       Bilateral lower extremity numbness and weakness   Psychiatric/Behavioral: The patient is not nervous/anxious.     Physical Exam Constitutional:      General: He  is not in acute distress.    Appearance: He is well-developed. He is not diaphoretic.  Neck:     Musculoskeletal: Neck supple.     Thyroid: No thyromegaly.  Cardiovascular:     Rate and Rhythm: Normal rate and regular rhythm.     Pulses: Normal pulses.     Heart sounds: Normal heart sounds.     Comments: Pace maker Pulmonary:     Effort: Pulmonary effort is normal. No respiratory distress.     Breath sounds: Normal breath sounds.  Abdominal:     General: Bowel sounds are normal. There is no distension.     Palpations: Abdomen is soft.     Tenderness: There is no abdominal tenderness.  Genitourinary:    Comments: Foley  Musculoskeletal:     Right lower leg: Edema present.     Left lower leg: Edema present.     Comments: Is able to move all extremities He has bilateral lower extremity numbness  Has bilateral lower extremity weakness       3+ bilateral lower extremity edema  Ace wraps in place   Lymphadenopathy:     Cervical: No cervical adenopathy.  Skin:    General: Skin is warm and dry.  Neurological:     Mental Status: He is alert and oriented to person, place, and time.  Psychiatric:        Mood and Affect: Mood normal.     ASSESSMENT/ PLAN:  TODAY:   1. Bilateral lower extremity edema: is worse: will continue lasix 40 mg daily will start aldactone 25 mg daily and will stop k+. Will check BMP  11-18-18    MD is aware of resident's narcotic use and is in agreement with current plan of care. We will attempt  to wean resident as apropriate   Ok Edwards NP [facility]  Contact [phone] Monday through Friday 8am- 5pm  After hours call [phone]

## 2018-11-12 ENCOUNTER — Ambulatory Visit (INDEPENDENT_AMBULATORY_CARE_PROVIDER_SITE_OTHER): Payer: Medicare Other | Admitting: Internal Medicine

## 2018-11-12 VITALS — BP 122/72 | Ht 71.0 in | Wt 210.0 lb

## 2018-11-12 DIAGNOSIS — Z950 Presence of cardiac pacemaker: Secondary | ICD-10-CM

## 2018-11-12 DIAGNOSIS — I482 Chronic atrial fibrillation, unspecified: Secondary | ICD-10-CM

## 2018-11-12 DIAGNOSIS — I2510 Atherosclerotic heart disease of native coronary artery without angina pectoris: Secondary | ICD-10-CM

## 2018-11-12 LAB — CUP PACEART INCLINIC DEVICE CHECK
Battery Remaining Longevity: 106 mo
Battery Voltage: 3 V
Brady Statistic AP VP Percent: 0.02 %
Brady Statistic AP VS Percent: 91.83 %
Brady Statistic AS VP Percent: 0 %
Brady Statistic AS VS Percent: 8.15 %
Brady Statistic RA Percent Paced: 91.77 %
Brady Statistic RV Percent Paced: 0.02 %
Date Time Interrogation Session: 20200825104818
Implantable Lead Implant Date: 20180612
Implantable Lead Implant Date: 20180612
Implantable Lead Location: 753860
Implantable Lead Location: 753860
Implantable Lead Model: 3830
Implantable Lead Model: 5076
Implantable Pulse Generator Implant Date: 20180612
Lead Channel Impedance Value: 323 Ohm
Lead Channel Impedance Value: 399 Ohm
Lead Channel Impedance Value: 456 Ohm
Lead Channel Impedance Value: 494 Ohm
Lead Channel Sensing Intrinsic Amplitude: 13.375 mV
Lead Channel Sensing Intrinsic Amplitude: 18 mV
Lead Channel Sensing Intrinsic Amplitude: 3.25 mV
Lead Channel Sensing Intrinsic Amplitude: 3.25 mV
Lead Channel Setting Pacing Amplitude: 2 V
Lead Channel Setting Pacing Amplitude: 2 V
Lead Channel Setting Pacing Pulse Width: 0.4 ms
Lead Channel Setting Sensing Sensitivity: 0.9 mV

## 2018-11-12 NOTE — Patient Instructions (Signed)
Medication Instructions:  Your physician recommends that you continue on your current medications as directed. Please refer to the Current Medication list given to you today.  If you need a refill on your cardiac medications before your next appointment, please call your pharmacy.   Lab work: NONE   If you have labs (blood work) drawn today and your tests are completely normal, you will receive your results only by: . MyChart Message (if you have MyChart) OR . A paper copy in the mail If you have any lab test that is abnormal or we need to change your treatment, we will call you to review the results.  Testing/Procedures: NONE   Follow-Up: At [facility], you and your health needs are our priority.  As part of our continuing mission to provide you with exceptional heart care, we have created designated Provider Care Teams.  These Care Teams include your primary Cardiologist (physician) and Advanced Practice Providers (APPs -  Physician Assistants and Nurse Practitioners) who all work together to provide you with the care you need, when you need it. You will need a follow up appointment in 1 years.  Please call our office 2 months in advance to schedule this appointment.  You may see Gregg Taylor, MD or one of the following Advanced Practice Providers on your designated Care Team:   Amber Seiler, NP . Renee Ursuy, PA-C  Any Other Special Instructions Will Be Listed Below (If Applicable). Thank you for choosing [facility]!     

## 2018-11-12 NOTE — Progress Notes (Signed)
HPI [name] returns today for followup. He is a pleasant 73 yo man with a h/o bronchoalveolar cell CA of the lung, chronic atrial fib, CHB, s/p PPM insertion. In the interim he has had back surgery and has undergone surgical decompression of L4-L5. He has struggled with weakness in his legs. He has some incontinence and uses a foley catheter. He denies fever or chills. He has had problems with lower extremity edema and his legs are wrapped.  Allergies  Allergen Reactions  . Codeine Other (See Comments)    Dizziness, stomach pain     Current Outpatient Medications  Medication Sig Dispense Refill  . clonazePAM (KLONOPIN) 1 MG tablet Take 1 tablet (1 mg total) by mouth 3 (three) times daily. 90 tablet 0   No current facility-administered medications for this visit.      Past Medical History:  Diagnosis Date  . Adenopathy    RIGHT ADRENAL  . Anxiety   . Arthritis    KNEES/HANDS  . CAD (coronary artery disease)    a. Prior known CTO of LCX with recanalizationby cath 2008;  b. 09/2012 NSTEMI/Cath/PCI: LM nl, LAD 32m, D1 50p, LCX 100p (2.75x16 Promus Premier DES), 80-68m, OM1 30, RCA 36m (3.0x16 Promus Premier DES), EF 50-55%.  [name] History of echocardiogram    Echo 2/18: Moderate LVH, EF 55-60, normal wall motion, trivial AI, moderate LAE, mild RVE, mild RAE, mildly elevated pulmonary pressure  . Hx of melanoma of skin   . 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 Malignant neoplasm of hilus of lung (HCC)    NON-SMALL-CELL    . Non-small cell lung cancer ([place])    a. s/p resection and XRT.  [name] PAF (paroxysmal atrial fibrillation) ([place])    a. not currently on anticoagulation (09/2012)  . Presence of permanent cardiac pacemaker   . 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 Uses walker     ROS:   All systems reviewed and negative except as noted in the HPI.   Past Surgical History:  Procedure Laterality Date  . ADRENALEXTOMY  02/2000  . CARDIAC CATHETERIZATION   2008   LAD 40, D1 OK, D2 50, CFX 90, RCA OK, EF 60%; Consider PCI PRN but the stenosis is quite complex and we would end up jailing several large posterolateral branches    . HERNIA REPAIR  05/27/2018  . INGUINAL HERNIA REPAIR Right 05/27/2018   Procedure: INCARCERATED RIGHT INGUINAL HERNIA REPAIR;  Surgeon: Erroll Luna, MD;  Location: [place];  Service: General;  Laterality: Right;  . LEFT HEART CATHETERIZATION WITH CORONARY ANGIOGRAM N/A 10/17/2012   Procedure: LEFT HEART CATHETERIZATION WITH CORONARY ANGIOGRAM;  Surgeon: Sherren Mocha, MD;  Location: [facility] CATH LAB;  Service: Cardiovascular;  Laterality: N/A;  . LOBECTOMY LEFT UPPER LOBE  08/15/2001  . LUMBAR LAMINECTOMY/DECOMPRESSION MICRODISCECTOMY N/A 07/25/2018   Procedure: Lumbar Four-Five Laminectomy;  Surgeon: Kristeen Miss, MD;  Location: [place];  Service: Neurosurgery;  Laterality: N/A;  Lumbar Four-Five Laminectomy  . PACEMAKER IMPLANT N/A 08/29/2016   Procedure: Pacemaker Implant;  Surgeon: Evans Lance, MD;  Location: [place] CV LAB;  Service: Cardiovascular;  Laterality: N/A;  . Right VATS, mini thoracotomy, wedge resection of right upper  01/18/2004     Family History  Problem Relation Age of Onset  . Heart attack Father   . Cancer Mother        lung  . Cancer Cousin  breast     Social History   Socioeconomic History  . Marital status: Divorced    Spouse name: Not on file  . Number of children: Not on file  . Years of education: Not on file  . Highest education level: Not on file  Occupational History  . Occupation: Retired    Fish farm manager: [employer]  . Financial resource strain: Not on file  . Food insecurity    Worry: Not on file    Inability: Not on file  . Transportation needs    Medical: Not on file    Non-medical: Not on file  Tobacco Use  . Smoking status: Former Smoker    Packs/day: 1.00    Years: 40.00    Pack years: 40.00    Types: Cigarettes    Quit date:  08/15/2001    Years since quitting: 17.2  . Smokeless tobacco: Never Used  Substance and Sexual Activity  . Alcohol use: No  . Drug use: No  . Sexual activity: Not Currently  Lifestyle  . Physical activity    Days per week: Not on file    Minutes per session: Not on file  . Stress: Not on file  Relationships  . Social Herbalist on phone: Not on file    Gets together: Not on file    Attends religious service: Not on file    Active member of club or organization: Not on file    Attends meetings of clubs or organizations: Not on file    Relationship status: Not on file  . Intimate partner violence    Fear of current or ex partner: Not on file    Emotionally abused: Not on file    Physically abused: Not on file    Forced sexual activity: Not on file  Other Topics Concern  . Not on file  Social History Narrative  . Not on file     BP 122/72   Ht 5\' 11"  (1.803 m)   Wt 210 lb (95.3 kg)   BMI 29.29 kg/m   Physical Exam:  Stable elderly appearing NAD HEENT: Unremarkable Neck:  No JVD, no thyromegally Lymphatics:  No adenopathy Back:  No CVA tenderness Lungs:  Clear with minimal basilar rales HEART:  Regular rate rhythm, no murmurs, no rubs, no clicks Abd:  soft, positive bowel sounds, no organomegally, no rebound, no guarding Ext:  2 plus pulses, 3+ edema, no cyanosis, no clubbing Skin:  No rashes no nodules Neuro:  CN II through XII intact, motor grossly intact   DEVICE  Normal device function.  See PaceArt for details.   Assess/Plan: 1. Atrial fib - his VR is well controlled. 2. CHB - he is asymptomatic, s/p PPM insertion. He is dependent. 3. PPM - his medtronic DDD PM is working normally. His atrial lead is pacing the his bundle with satisfactory thresholds. We will recheck in several months.  Mikle Bosworth.D.

## 2018-11-13 ENCOUNTER — Non-Acute Institutional Stay (SKILLED_NURSING_FACILITY): Payer: Medicare Other | Admitting: Adult Health

## 2018-11-13 ENCOUNTER — Encounter: Payer: Self-pay | Admitting: Adult Health

## 2018-11-13 DIAGNOSIS — G834 Cauda equina syndrome: Secondary | ICD-10-CM

## 2018-11-13 DIAGNOSIS — I4821 Permanent atrial fibrillation: Secondary | ICD-10-CM | POA: Diagnosis not present

## 2018-11-13 DIAGNOSIS — R600 Localized edema: Secondary | ICD-10-CM | POA: Diagnosis not present

## 2018-11-13 NOTE — Progress Notes (Signed)
Location:    [place] Room Number: 127/D Place of Service:  SNF (31)   CODE STATUS: Full Code  Allergies  Allergen Reactions  . Codeine Other (See Comments)    Dizziness, stomach pain    Chief Complaint  Patient presents with  . Acute Visit    Care Plan Meeting    HPI:  We have come together for his care plan meeting. BIM 15/15 mood 8/30. He has not had any falls. His weight is stable. He continues to have lower extremity edema. There are no reports of uncontrolled pain; his appetite is good. He denies any anxiety or depressive thoughts.   Past Medical History:  Diagnosis Date  . Adenopathy    RIGHT ADRENAL  . Anxiety   . Arthritis    KNEES/HANDS  . CAD (coronary artery disease)    a. Prior known CTO of LCX with recanalizationby cath 2008;  b. 09/2012 NSTEMI/Cath/PCI: LM nl, LAD 77m, D1 50p, LCX 100p (2.75x16 Promus Premier DES), 80-28m, OM1 30, RCA 16m (3.0x16 Promus Premier DES), EF 50-55%.  [name] History of echocardiogram    Echo 2/18: Moderate LVH, EF 55-60, normal wall motion, trivial AI, moderate LAE, mild RVE, mild RAE, mildly elevated pulmonary pressure  . Hx of melanoma of skin   . 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 Malignant neoplasm of hilus of lung (HCC)    NON-SMALL-CELL    . Non-small cell lung cancer ([place])    a. s/p resection and XRT.  [name] PAF (paroxysmal atrial fibrillation) ([place])    a. not currently on anticoagulation (09/2012)  . Presence of permanent cardiac pacemaker   . 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 Uses walker     Past Surgical History:  Procedure Laterality Date  . ADRENALEXTOMY  02/2000  . CARDIAC CATHETERIZATION  2008   LAD 40, D1 OK, D2 50, CFX 90, RCA OK, EF 60%; Consider PCI PRN but the stenosis is quite complex and we would end up jailing several large posterolateral branches    . HERNIA REPAIR  05/27/2018  . INGUINAL HERNIA REPAIR Right 05/27/2018   Procedure: INCARCERATED RIGHT INGUINAL  HERNIA REPAIR;  Surgeon: Erroll Luna, MD;  Location: [place];  Service: General;  Laterality: Right;  . LEFT HEART CATHETERIZATION WITH CORONARY ANGIOGRAM N/A 10/17/2012   Procedure: LEFT HEART CATHETERIZATION WITH CORONARY ANGIOGRAM;  Surgeon: Sherren Mocha, MD;  Location: [facility] CATH LAB;  Service: Cardiovascular;  Laterality: N/A;  . LOBECTOMY LEFT UPPER LOBE  08/15/2001  . LUMBAR LAMINECTOMY/DECOMPRESSION MICRODISCECTOMY N/A 07/25/2018   Procedure: Lumbar Four-Five Laminectomy;  Surgeon: Kristeen Miss, MD;  Location: [place];  Service: Neurosurgery;  Laterality: N/A;  Lumbar Four-Five Laminectomy  . PACEMAKER IMPLANT N/A 08/29/2016   Procedure: Pacemaker Implant;  Surgeon: Evans Lance, MD;  Location: [place] CV LAB;  Service: Cardiovascular;  Laterality: N/A;  . Right VATS, mini thoracotomy, wedge resection of right upper  01/18/2004    Social History   Socioeconomic History  . Marital status: Divorced    Spouse name: Not on file  . Number of children: Not on file  . Years of education: Not on file  . Highest education level: Not on file  Occupational History  . Occupation: Retired    Fish farm manager: [employer]  . Financial resource strain: Not on file  . Food insecurity    Worry: Not on file    Inability: Not on file  .  Transportation needs    Medical: Not on file    Non-medical: Not on file  Tobacco Use  . Smoking status: Former Smoker    Packs/day: 1.00    Years: 40.00    Pack years: 40.00    Types: Cigarettes    Quit date: 08/15/2001    Years since quitting: 17.2  . Smokeless tobacco: Never Used  Substance and Sexual Activity  . Alcohol use: No  . Drug use: No  . Sexual activity: Not Currently  Lifestyle  . Physical activity    Days per week: Not on file    Minutes per session: Not on file  . Stress: Not on file  Relationships  . Social Herbalist on phone: Not on file    Gets together: Not on file    Attends religious service: Not  on file    Active member of club or organization: Not on file    Attends meetings of clubs or organizations: Not on file    Relationship status: Not on file  . Intimate partner violence    Fear of current or ex partner: Not on file    Emotionally abused: Not on file    Physically abused: Not on file    Forced sexual activity: Not on file  Other Topics Concern  . Not on file  Social History Narrative  . Not on file   Family History  Problem Relation Age of Onset  . Heart attack Father   . Cancer Mother        lung  . Cancer Cousin        breast      VITAL SIGNS BP 125/82   Pulse 63   Temp 98 F (36.7 C) (Oral)   Resp 18   Ht 5\' 11"  (1.803 m)   Wt 198 lb 6.4 oz (90 kg)   SpO2 94%   BMI 27.67 kg/m   Outpatient Encounter Medications as of 11/13/2018  Medication Sig  . acetaminophen (TYLENOL) 325 MG tablet Take 2 tablets (650 mg total) by mouth every 6 (six) hours as needed for mild pain (or temp > 100).  [name] apixaban (ELIQUIS) 5 MG TABS tablet Take 5 mg by mouth 2 (two) times daily.  [name] atorvastatin (LIPITOR) 20 MG tablet Take 20 mg by mouth daily.   Roseanne Kaufman Peru-Castor Oil (VENELEX) OINT Apply topically to sacrum every shift until resolved  . clonazePAM (KLONOPIN) 1 MG tablet Take 1 tablet (1 mg total) by mouth 3 (three) times daily.  . furosemide (LASIX) 40 MG tablet Take 40 mg by mouth daily.  . magnesium hydroxide (MILK OF MAGNESIA) 400 MG/5ML suspension Take 30 mLs by mouth daily as needed for mild constipation.  . metoprolol tartrate (LOPRESSOR) 25 MG tablet Take 25 mg by mouth daily.  . nitroGLYCERIN (NITROSTAT) 0.4 MG SL tablet Place 1 tablet (0.4 mg total) under the tongue every 5 (five) minutes x 3 doses as needed for chest pain.  . NON FORMULARY Diet Type: Regular,  NAS, Consistent Carbohydrate  . polyethylene glycol (MIRALAX / GLYCOLAX) 17 g packet Take 17 g by mouth daily.  [name] spironolactone (ALDACTONE) 25 MG tablet Take 25 mg by mouth daily.  . tamsulosin  (FLOMAX) 0.4 MG CAPS capsule Take 0.4 mg by mouth every evening.   No facility-administered encounter medications on file as of 11/13/2018.      SIGNIFICANT DIAGNOSTIC EXAMS   PREVIOUS:   07-29-18: chest x-ray:  1. No acute cardiopulmonary  disease. 2. Stable post treatment changes in the upper left hemithorax. 3. Stable irregular nodular opacities in the mid and upper right lung, please see recent chest CT report 07/10/2018 for details.  NO NEW EXAMS.   LABS REVIEWED PREVIOUS:   07-26-18 wbc 13.8; hgb 13.9; hct 41.8; mcv 89.5 ;plt 139; glucose 143; bun 16; creat 0.93; k+ 3.9; na++ 138; ca 8.6 tsh 0.408 07-29-18: wbc 18.6; hgb 12.6; hct 38.7; mcv 90.0; plt 143  glucose 112; bun 23; creat 1.00; k+ 3.9; na++ 138; ca 8.6 urine culture: e-coli ESBL: cipro   Blood culture: enterobacteriaceae species  07-31-18: wbc 8.9; hgb 12.9; hct 38.5; mcv 89.3 ;plt 139; glucose 124; bun 29; creat 1.16; k+ 3.7; na++ 133; ca 8.2  07-31-18: urine culture: none 08-13-18: wbc 8.6; hgb 12.7; hct 39.4; mcv 90.4 ;plt 278; glucose 103; bun 19; creat 0.76; k+ 4.1; na++ 134; ca 8.0  09-10-18: glucose 99; bun 19; creat 0.86 ;k+ 3.7; na++ 136; ca 8.3  10-05-18: guaiac neg 10-06-27: guaiac positive 10-07-18: chol 116; bun 59; trig 95; hdl 38 10-08-18: hep C neg 10-17-18: guaiac neg   NO NEW LABS.    Review of Systems  Constitutional: Negative for malaise/fatigue.  Respiratory: Negative for cough and shortness of breath.   Cardiovascular: Positive for leg swelling. Negative for chest pain and palpitations.  Gastrointestinal: Negative for abdominal pain, constipation and heartburn.  Musculoskeletal: Negative for back pain, joint pain and myalgias.  Skin: Negative.   Neurological: Negative for dizziness.       Bilateral lower extremity numbness and weakness    Psychiatric/Behavioral: The patient is not nervous/anxious.      Physical Exam Constitutional:      General: He is not in acute distress.    Appearance: He  is well-developed. He is not diaphoretic.  Neck:     Musculoskeletal: Neck supple.     Thyroid: No thyromegaly.  Cardiovascular:     Rate and Rhythm: Normal rate and regular rhythm.     Pulses: Normal pulses.     Heart sounds: Normal heart sounds.     Comments: Pace maker  Pulmonary:     Effort: Pulmonary effort is normal. No respiratory distress.     Breath sounds: Normal breath sounds.  Abdominal:     General: Bowel sounds are normal. There is no distension.     Palpations: Abdomen is soft.     Tenderness: There is no abdominal tenderness.  Genitourinary:    Comments: Foley  Musculoskeletal:     Right lower leg: Edema present.     Left lower leg: Edema present.     Comments: He has bilateral lower extremity numbness  Has bilateral lower extremity weakness       3+ bilateral lower extremity edema  Ace wraps in place    Lymphadenopathy:     Cervical: No cervical adenopathy.  Skin:    General: Skin is warm and dry.  Neurological:     Mental Status: He is alert and oriented to person, place, and time.  Psychiatric:        Mood and Affect: Mood normal.      ASSESSMENT/ PLAN:  TODAY;   1. Cauda equina syndrome 2. Permanent atrial fibrillation 3. Bilateral lower extremity edema  Will continue current medications Will continue current plan of care Will continue to monitor his status.      MD is aware of resident's narcotic use and is in agreement with current plan of care. We will attempt  to wean resident as apropriate   Ok Edwards NP [facility]  Contact [phone] Monday through Friday 8am- 5pm  After hours call [phone]

## 2018-11-15 ENCOUNTER — Telehealth: Payer: Self-pay | Admitting: *Deleted

## 2018-11-15 NOTE — Telephone Encounter (Signed)
Clinical pharmacist to review this urgent clearance

## 2018-11-15 NOTE — Telephone Encounter (Signed)
   [facility] Pre-operative Risk Assessment    Request for surgical clearance:  1. What type of surgery is being performed? LUMBAR MYELOGRAM/POST MYELOGRAM CT SCAN AND FOR POSSIBLE LUMBAR SPINE SURGERY   2. When is this surgery scheduled?  TBD   3. What type of clearance is required (medical clearance vs. Pharmacy clearance to hold med vs. Both)?  BOTH  4. Are there any medications that need to be held prior to surgery and how long? ELIQUIS 3-5 DAYS   5. Practice name and name of physician performing surgery?  DR. Ellene Route / [facility]    6. What is your office phone number [phone]    7.   What is your office fax number [phone] ATTN:  JESSICA  8.   Anesthesia type (None, local, MAC, general) ?  N/A   Jeanann Lewandowsky 11/15/2018, 3:32 PM  _________________________________________________________________   (provider comments below)

## 2018-11-18 ENCOUNTER — Other Ambulatory Visit (HOSPITAL_COMMUNITY)
Admission: RE | Admit: 2018-11-18 | Discharge: 2018-11-18 | Disposition: A | Discharge status: Home / Self Care | Payer: Medicare Other | Source: Ambulatory Visit | Attending: Adult Health | Admitting: Adult Health

## 2018-11-18 ENCOUNTER — Encounter: Payer: Self-pay | Admitting: Adult Health

## 2018-11-18 ENCOUNTER — Other Ambulatory Visit: Payer: Self-pay | Admitting: Neurological Surgery

## 2018-11-18 ENCOUNTER — Non-Acute Institutional Stay (SKILLED_NURSING_FACILITY): Payer: Medicare Other | Admitting: Adult Health

## 2018-11-18 DIAGNOSIS — C3431 Malignant neoplasm of lower lobe, right bronchus or lung: Secondary | ICD-10-CM

## 2018-11-18 DIAGNOSIS — I509 Heart failure, unspecified: Secondary | ICD-10-CM | POA: Insufficient documentation

## 2018-11-18 DIAGNOSIS — R600 Localized edema: Secondary | ICD-10-CM

## 2018-11-18 DIAGNOSIS — G834 Cauda equina syndrome: Secondary | ICD-10-CM

## 2018-11-18 LAB — BASIC METABOLIC PANEL
Anion gap: 8 (ref 5–15)
BUN: 19 mg/dL (ref 8–23)
CO2: 24 mmol/L (ref 22–32)
Calcium: 8.3 mg/dL — ABNORMAL LOW (ref 8.9–10.3)
Chloride: 100 mmol/L (ref 98–111)
Creatinine, Ser: 0.65 mg/dL (ref 0.61–1.24)
GFR calc Af Amer: >60 mL/min (ref >60)
GFR calc non Af Amer: >60 mL/min (ref >60)
Glucose, Bld: 109 mg/dL — ABNORMAL HIGH (ref 70–99)
Potassium: 3.9 mmol/L (ref 3.5–5.1)
Sodium: 132 mmol/L — ABNORMAL LOW (ref 135–145)

## 2018-11-18 NOTE — Progress Notes (Signed)
Location:    [place] Room Number: 127/D Place of Service:  SNF (31)   CODE STATUS: Full Code  Allergies  Allergen Reactions  . Codeine Other (See Comments)    Dizziness, stomach pain    Chief Complaint  Patient presents with  . Acute Visit    Leg Pain    HPI:  He is complaining of bilateral lower extremity burning pain. He states that the pain is severe enough to interfere with his quality of life during the day. He is having difficulty sleeping at night due to the pain. He continues to have significant lower extremity edema. He has been declining the use of scd. He is having wheezing without cough or congestion. There are no reports of fevers present.    Past Medical History:  Diagnosis Date  . Adenopathy    RIGHT ADRENAL  . Anxiety   . Arthritis    KNEES/HANDS  . CAD (coronary artery disease)    a. Prior known CTO of LCX with recanalizationby cath 2008;  b. 09/2012 NSTEMI/Cath/PCI: LM nl, LAD 9m, D1 50p, LCX 100p (2.75x16 Promus Premier DES), 80-14m, OM1 30, RCA 59m (3.0x16 Promus Premier DES), EF 50-55%.  [name] History of echocardiogram    Echo 2/18: Moderate LVH, EF 55-60, normal wall motion, trivial AI, moderate LAE, mild RVE, mild RAE, mildly elevated pulmonary pressure  . Hx of melanoma of skin   . 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 Malignant neoplasm of hilus of lung (HCC)    NON-SMALL-CELL    . Non-small cell lung cancer ([place])    a. s/p resection and XRT.  [name] PAF (paroxysmal atrial fibrillation) ([place])    a. not currently on anticoagulation (09/2012)  . Presence of permanent cardiac pacemaker   . 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 Uses walker     Past Surgical History:  Procedure Laterality Date  . ADRENALEXTOMY  02/2000  . CARDIAC CATHETERIZATION  2008   LAD 40, D1 OK, D2 50, CFX 90, RCA OK, EF 60%; Consider PCI PRN but the stenosis is quite complex and we would end up jailing several large posterolateral  branches    . HERNIA REPAIR  05/27/2018  . INGUINAL HERNIA REPAIR Right 05/27/2018   Procedure: INCARCERATED RIGHT INGUINAL HERNIA REPAIR;  Surgeon: Erroll Luna, MD;  Location: [place];  Service: General;  Laterality: Right;  . LEFT HEART CATHETERIZATION WITH CORONARY ANGIOGRAM N/A 10/17/2012   Procedure: LEFT HEART CATHETERIZATION WITH CORONARY ANGIOGRAM;  Surgeon: Sherren Mocha, MD;  Location: [facility] CATH LAB;  Service: Cardiovascular;  Laterality: N/A;  . LOBECTOMY LEFT UPPER LOBE  08/15/2001  . LUMBAR LAMINECTOMY/DECOMPRESSION MICRODISCECTOMY N/A 07/25/2018   Procedure: Lumbar Four-Five Laminectomy;  Surgeon: Kristeen Miss, MD;  Location: [place];  Service: Neurosurgery;  Laterality: N/A;  Lumbar Four-Five Laminectomy  . PACEMAKER IMPLANT N/A 08/29/2016   Procedure: Pacemaker Implant;  Surgeon: Evans Lance, MD;  Location: [place] CV LAB;  Service: Cardiovascular;  Laterality: N/A;  . Right VATS, mini thoracotomy, wedge resection of right upper  01/18/2004    Social History   Socioeconomic History  . Marital status: Divorced    Spouse name: Not on file  . Number of children: Not on file  . Years of education: Not on file  . Highest education level: Not on file  Occupational History  . Occupation: Retired    Fish farm manager: [place]  . Financial resource strain: Not on  file  . Food insecurity    Worry: Not on file    Inability: Not on file  . Transportation needs    Medical: Not on file    Non-medical: Not on file  Tobacco Use  . Smoking status: Former Smoker    Packs/day: 1.00    Years: 40.00    Pack years: 40.00    Types: Cigarettes    Quit date: 08/15/2001    Years since quitting: 17.2  . Smokeless tobacco: Never Used  Substance and Sexual Activity  . Alcohol use: No  . Drug use: No  . Sexual activity: Not Currently  Lifestyle  . Physical activity    Days per week: Not on file    Minutes per session: Not on file  . Stress: Not on file   Relationships  . Social Herbalist on phone: Not on file    Gets together: Not on file    Attends religious service: Not on file    Active member of club or organization: Not on file    Attends meetings of clubs or organizations: Not on file    Relationship status: Not on file  . Intimate partner violence    Fear of current or ex partner: Not on file    Emotionally abused: Not on file    Physically abused: Not on file    Forced sexual activity: Not on file  Other Topics Concern  . Not on file  Social History Narrative  . Not on file   Family History  Problem Relation Age of Onset  . Heart attack Father   . Cancer Mother        lung  . Cancer Cousin        breast      VITAL SIGNS BP 102/69   Pulse 78   Temp 98.4 F (36.9 C) (Oral)   Resp 18   Ht 5\' 11"  (1.803 m)   Wt 198 lb 6.4 oz (90 kg)   SpO2 94%   BMI 27.67 kg/m   Outpatient Encounter Medications as of 11/18/2018  Medication Sig  . acetaminophen (TYLENOL) 325 MG tablet Take 2 tablets (650 mg total) by mouth every 6 (six) hours as needed for mild pain (or temp > 100).  [name] apixaban (ELIQUIS) 5 MG TABS tablet Take 5 mg by mouth 2 (two) times daily.  [name] atorvastatin (LIPITOR) 20 MG tablet Take 20 mg by mouth daily.   Roseanne Kaufman Peru-Castor Oil (VENELEX) OINT Apply topically to sacrum every shift until resolved  . clonazePAM (KLONOPIN) 1 MG tablet Take 1 tablet (1 mg total) by mouth 3 (three) times daily.  . furosemide (LASIX) 40 MG tablet Take 40 mg by mouth daily.  [name] gabapentin (NEURONTIN) 100 MG capsule Take 100 mg by mouth at bedtime.  [name] ipratropium-albuterol (DUONEB) 0.5-2.5 (3) MG/3ML SOLN Take 3 mLs by nebulization 2 (two) times daily.  . magnesium hydroxide (MILK OF MAGNESIA) 400 MG/5ML suspension Take 30 mLs by mouth daily as needed for mild constipation.  . metoprolol tartrate (LOPRESSOR) 25 MG tablet Take 25 mg by mouth daily.  . nitroGLYCERIN (NITROSTAT) 0.4 MG SL tablet Place 1 tablet (0.4 mg  total) under the tongue every 5 (five) minutes x 3 doses as needed for chest pain.  . NON FORMULARY Diet Type: Regular,  NAS, Consistent Carbohydrate  . NON FORMULARY ACE Wraps to BLE d/t increased edema Special Instructions: On during the day off at [facility] Once A Day  .  polyethylene glycol (MIRALAX / GLYCOLAX) 17 g packet Take 17 g by mouth daily.  [name] spironolactone (ALDACTONE) 25 MG tablet Take 25 mg by mouth daily.  . tamsulosin (FLOMAX) 0.4 MG CAPS capsule Take 0.4 mg by mouth every evening.   No facility-administered encounter medications on file as of 11/18/2018.      SIGNIFICANT DIAGNOSTIC EXAMS   PREVIOUS:   07-29-18: chest x-ray:  1. No acute cardiopulmonary disease. 2. Stable post treatment changes in the upper left hemithorax. 3. Stable irregular nodular opacities in the mid and upper right lung, please see recent chest CT report 07/10/2018 for details.  NO NEW EXAMS.   LABS REVIEWED PREVIOUS:   07-26-18 wbc 13.8; hgb 13.9; hct 41.8; mcv 89.5 ;plt 139; glucose 143; bun 16; creat 0.93; k+ 3.9; na++ 138; ca 8.6 tsh 0.408 07-29-18: wbc 18.6; hgb 12.6; hct 38.7; mcv 90.0; plt 143  glucose 112; bun 23; creat 1.00; k+ 3.9; na++ 138; ca 8.6 urine culture: e-coli ESBL: cipro   Blood culture: enterobacteriaceae species  07-31-18: wbc 8.9; hgb 12.9; hct 38.5; mcv 89.3 ;plt 139; glucose 124; bun 29; creat 1.16; k+ 3.7; na++ 133; ca 8.2  07-31-18: urine culture: none 08-13-18: wbc 8.6; hgb 12.7; hct 39.4; mcv 90.4 ;plt 278; glucose 103; bun 19; creat 0.76; k+ 4.1; na++ 134; ca 8.0  09-10-18: glucose 99; bun 19; creat 0.86 ;k+ 3.7; na++ 136; ca 8.3  10-05-18: guaiac neg 10-06-27: guaiac positive 10-07-18: chol 116; bun 59; trig 95; hdl 38 10-08-18: hep C neg 10-17-18: guaiac neg   TODAY;   11-18-18: glucose 109; bun 19; creat 0,65; k+ 3.8; na++ 138 ca 8.3     Review of Systems  Constitutional: Negative for malaise/fatigue.  Respiratory: Negative for cough and shortness of breath.    Cardiovascular: Positive for leg swelling. Negative for chest pain and palpitations.  Gastrointestinal: Negative for abdominal pain, constipation and heartburn.  Musculoskeletal: Negative for back pain, joint pain and myalgias.  Skin: Negative.   Neurological: Negative for dizziness.       Has burning pain in bilateral lower extremities   Psychiatric/Behavioral: The patient is not nervous/anxious.     Physical Exam Constitutional:      General: He is not in acute distress.    Appearance: He is well-developed. He is not diaphoretic.  Neck:     Musculoskeletal: Neck supple.     Thyroid: No thyromegaly.  Cardiovascular:     Rate and Rhythm: Normal rate and regular rhythm.     Pulses: Normal pulses.     Heart sounds: Normal heart sounds.     Comments: Pace maker  Pulmonary:     Effort: Pulmonary effort is normal. No respiratory distress.     Breath sounds: Wheezing present.  Abdominal:     General: Bowel sounds are normal. There is no distension.     Palpations: Abdomen is soft.     Tenderness: There is no abdominal tenderness.  Genitourinary:    Comments: Foley  Musculoskeletal: Normal range of motion.     Right lower leg: Edema present.     Left lower leg: Edema present.     Comments: He has bilateral lower extremity numbness  Has bilateral lower extremity weakness       3+ bilateral lower extremity edema  Ace wraps in place     Lymphadenopathy:     Cervical: No cervical adenopathy.  Skin:    General: Skin is warm and dry.  Neurological:  Mental Status: He is alert and oriented to person, place, and time.  Psychiatric:        Mood and Affect: Mood normal.       ASSESSMENT/ PLAN:  TODAY:   1. 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 Bilateral lower extremity edema 2. Cauda equina syndrome  I have strongly encouraged him to continue to use scd Will begin him on neurontin 100 mg nightly for pain management Will begin duoneb twice daily through  12-02-18 for his wheezes.      MD is aware of resident's narcotic use and is in agreement with current plan of care. We will attempt to wean resident as appropriate.  Ok Edwards NP [facility]  Contact [phone] Monday through Friday 8am- 5pm  After hours call [phone]

## 2018-11-18 NOTE — Telephone Encounter (Signed)
Pt takes Eliquis for afib with CHADS2VASc score of 3 (age x2, CAD). Renal function is normal. Ok to hold Eliquis for 3 days prior to spinal procedure.

## 2018-11-18 NOTE — Telephone Encounter (Signed)
   Primary Cardiologist: Mertie Moores, MD  Chart reviewed as part of pre-operative protocol coverage. Given past medical history and time since last visit, based on ACC/AHA guidelines, Benjamin [name] would be at acceptable risk for the planned procedure without further cardiovascular testing.   Ok to hold Eliquis for 3 days prior to spinal procedure.  I will route this recommendation to the requesting party via Epic fax function and remove from pre-op pool.  Please call with questions.  Lyda Jester, PA-C 11/18/2018, 9:23 AM

## 2018-11-21 ENCOUNTER — Non-Acute Institutional Stay (SKILLED_NURSING_FACILITY): Payer: Medicare Other | Admitting: Adult Health

## 2018-11-21 ENCOUNTER — Encounter: Payer: Self-pay | Admitting: Adult Health

## 2018-11-21 DIAGNOSIS — F419 Anxiety disorder, unspecified: Secondary | ICD-10-CM | POA: Diagnosis not present

## 2018-11-21 NOTE — Progress Notes (Signed)
Location:  [place] Room Number: 127-D Place of Service:  SNF (31)   CODE STATUS: Full Code  Allergies  Allergen Reactions  . Codeine Other (See Comments)    Dizziness, stomach pain    Chief Complaint  Patient presents with  . Acute Visit    Psychoactive medication review    HPI:  We have come together to discuss his psychoactive medication review. He is presently taking klonopin 1 mg three times daily. He failed his last wean in June. He has upcoming admission to the hospital; for more back surgery. He is somewhat nervous about the upcoming events. He has expressed some depressive thoughts; that he is essentially paralyzed from the waist down. He requires the use of a foley. We feel as though changing his medication will be inappropriate.   Past Medical History:  Diagnosis Date  . Adenopathy    RIGHT ADRENAL  . Anxiety   . Arthritis    KNEES/HANDS  . CAD (coronary artery disease)    a. Prior known CTO of LCX with recanalizationby cath 2008;  b. 09/2012 NSTEMI/Cath/PCI: LM nl, LAD 70m, D1 50p, LCX 100p (2.75x16 Promus Premier DES), 80-97m, OM1 30, RCA 17m (3.0x16 Promus Premier DES), EF 50-55%.  [name] History of echocardiogram    Echo 2/18: Moderate LVH, EF 55-60, normal wall motion, trivial AI, moderate LAE, mild RVE, mild RAE, mildly elevated pulmonary pressure  . Hx of melanoma of skin   . 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 Malignant neoplasm of hilus of lung (HCC)    NON-SMALL-CELL    . Non-small cell lung cancer ([place])    a. s/p resection and XRT.  [name] PAF (paroxysmal atrial fibrillation) ([place])    a. not currently on anticoagulation (09/2012)  . Presence of permanent cardiac pacemaker   . 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 Uses walker     Past Surgical History:  Procedure Laterality Date  . ADRENALEXTOMY  02/2000  . CARDIAC CATHETERIZATION  2008   LAD 40, D1 OK, D2 50, CFX 90, RCA OK, EF 60%; Consider PCI PRN but the  stenosis is quite complex and we would end up jailing several large posterolateral branches    . HERNIA REPAIR  05/27/2018  . INGUINAL HERNIA REPAIR Right 05/27/2018   Procedure: INCARCERATED RIGHT INGUINAL HERNIA REPAIR;  Surgeon: Erroll Luna, MD;  Location: [facility];  Service: General;  Laterality: Right;  . LEFT HEART CATHETERIZATION WITH CORONARY ANGIOGRAM N/A 10/17/2012   Procedure: LEFT HEART CATHETERIZATION WITH CORONARY ANGIOGRAM;  Surgeon: Sherren Mocha, MD;  Location: [facility] CATH LAB;  Service: Cardiovascular;  Laterality: N/A;  . LOBECTOMY LEFT UPPER LOBE  08/15/2001  . LUMBAR LAMINECTOMY/DECOMPRESSION MICRODISCECTOMY N/A 07/25/2018   Procedure: Lumbar Four-Five Laminectomy;  Surgeon: Kristeen Miss, MD;  Location: [place];  Service: Neurosurgery;  Laterality: N/A;  Lumbar Four-Five Laminectomy  . PACEMAKER IMPLANT N/A 08/29/2016   Procedure: Pacemaker Implant;  Surgeon: Evans Lance, MD;  Location: [facility] CV LAB;  Service: Cardiovascular;  Laterality: N/A;  . Right VATS, mini thoracotomy, wedge resection of right upper  01/18/2004    Social History   Socioeconomic History  . Marital status: Divorced    Spouse name: Not on file  . Number of children: Not on file  . Years of education: Not on file  . Highest education level: Not on file  Occupational History  . Occupation: Retired    Fish farm manager: [employer]  .  Financial resource strain: Not on file  . Food insecurity    Worry: Not on file    Inability: Not on file  . Transportation needs    Medical: Not on file    Non-medical: Not on file  Tobacco Use  . Smoking status: Former Smoker    Packs/day: 1.00    Years: 40.00    Pack years: 40.00    Types: Cigarettes    Quit date: 08/15/2001    Years since quitting: 17.2  . Smokeless tobacco: Never Used  Substance and Sexual Activity  . Alcohol use: No  . Drug use: No  . Sexual activity: Not Currently  Lifestyle  . Physical activity    Days per week:  Not on file    Minutes per session: Not on file  . Stress: Not on file  Relationships  . Social Herbalist on phone: Not on file    Gets together: Not on file    Attends religious service: Not on file    Active member of club or organization: Not on file    Attends meetings of clubs or organizations: Not on file    Relationship status: Not on file  . Intimate partner violence    Fear of current or ex partner: Not on file    Emotionally abused: Not on file    Physically abused: Not on file    Forced sexual activity: Not on file  Other Topics Concern  . Not on file  Social History Narrative  . Not on file   Family History  Problem Relation Age of Onset  . Heart attack Father   . Cancer Mother        lung  . Cancer Cousin        breast      VITAL SIGNS BP 102/69   Pulse 78   Temp 98.8 F (37.1 C) (Oral)   Resp 18   Ht 5\' 11"  (1.803 m)   Wt 194 lb 12.8 oz (88.4 kg)   SpO2 94%   BMI 27.17 kg/m   Outpatient Encounter Medications as of 11/21/2018  Medication Sig  . acetaminophen (TYLENOL) 325 MG tablet Take 2 tablets (650 mg total) by mouth every 6 (six) hours as needed for mild pain (or temp > 100).  [name] apixaban (ELIQUIS) 5 MG TABS tablet Take 5 mg by mouth 2 (two) times daily.  [name] atorvastatin (LIPITOR) 20 MG tablet Take 20 mg by mouth daily.   Roseanne Kaufman [place]-Castor Oil (VENELEX) OINT Apply topically to sacrum every shift until resolved  . clonazePAM (KLONOPIN) 1 MG tablet Take 1 tablet (1 mg total) by mouth 3 (three) times daily.  . furosemide (LASIX) 40 MG tablet Take 40 mg by mouth daily.  [name] gabapentin (NEURONTIN) 100 MG capsule Take 100 mg by mouth at bedtime.  [name] ipratropium-albuterol (DUONEB) 0.5-2.5 (3) MG/3ML SOLN Take 3 mLs by nebulization 2 (two) times daily.  . magnesium hydroxide (MILK OF MAGNESIA) 400 MG/5ML suspension Take 30 mLs by mouth daily as needed for mild constipation.  . metoprolol tartrate (LOPRESSOR) 25 MG tablet Take 25 mg by mouth  daily.  . nitroGLYCERIN (NITROSTAT) 0.4 MG SL tablet Place 1 tablet (0.4 mg total) under the tongue every 5 (five) minutes x 3 doses as needed for chest pain.  . NON FORMULARY Diet Type: Regular,  NAS, Consistent Carbohydrate  . NON FORMULARY ACE Wraps to BLE d/t increased edema Special Instructions: On during the day off at  [facility] Once A Day  . polyethylene glycol (MIRALAX / GLYCOLAX) 17 g packet Take 17 g by mouth daily.  [name] spironolactone (ALDACTONE) 25 MG tablet Take 25 mg by mouth daily.  . tamsulosin (FLOMAX) 0.4 MG CAPS capsule Take 0.4 mg by mouth every evening.   No facility-administered encounter medications on file as of 11/21/2018.      SIGNIFICANT DIAGNOSTIC EXAMS   PREVIOUS:   07-29-18: chest x-ray:  1. No acute cardiopulmonary disease. 2. Stable post treatment changes in the upper left hemithorax. 3. Stable irregular nodular opacities in the mid and upper right lung, please see recent chest CT report 07/10/2018 for details.  NO NEW EXAMS.   LABS REVIEWED PREVIOUS:   07-26-18 wbc 13.8; hgb 13.9; hct 41.8; mcv 89.5 ;plt 139; glucose 143; bun 16; creat 0.93; k+ 3.9; na++ 138; ca 8.6 tsh 0.408 07-29-18: wbc 18.6; hgb 12.6; hct 38.7; mcv 90.0; plt 143  glucose 112; bun 23; creat 1.00; k+ 3.9; na++ 138; ca 8.6 urine culture: e-coli ESBL: cipro   Blood culture: enterobacteriaceae species  07-31-18: wbc 8.9; hgb 12.9; hct 38.5; mcv 89.3 ;plt 139; glucose 124; bun 29; creat 1.16; k+ 3.7; na++ 133; ca 8.2  07-31-18: urine culture: none 08-13-18: wbc 8.6; hgb 12.7; hct 39.4; mcv 90.4 ;plt 278; glucose 103; bun 19; creat 0.76; k+ 4.1; na++ 134; ca 8.0  09-10-18: glucose 99; bun 19; creat 0.86 ;k+ 3.7; na++ 136; ca 8.3  10-05-18: guaiac neg 10-06-27: guaiac positive 10-07-18: chol 116; bun 59; trig 95; hdl 38 10-08-18: hep C neg 10-17-18: guaiac neg  11-18-18: glucose 109; bun 19; creat 0,65; k+ 3.8; na++ 138 ca 8.3  NO NEW LABS.      Review of Systems  Constitutional: Negative for  malaise/fatigue.  Respiratory: Negative for cough and shortness of breath.   Cardiovascular: Positive for leg swelling. Negative for chest pain and palpitations.  Gastrointestinal: Negative for abdominal pain, constipation and heartburn.  Musculoskeletal: Negative for back pain, joint pain and myalgias.  Skin: Negative.   Neurological: Negative for dizziness.  Psychiatric/Behavioral: Positive for depression. The patient is not nervous/anxious.    Physical Exam Constitutional:      General: He is not in acute distress.    Appearance: He is well-developed. He is not diaphoretic.  Neck:     Musculoskeletal: Neck supple.     Thyroid: No thyromegaly.  Cardiovascular:     Rate and Rhythm: Normal rate and regular rhythm.     Pulses: Normal pulses.     Heart sounds: Normal heart sounds.     Comments: Pace maker  Pulmonary:     Effort: Pulmonary effort is normal. No respiratory distress.     Breath sounds: Normal breath sounds.  Abdominal:     General: Bowel sounds are normal. There is no distension.     Palpations: Abdomen is soft.     Tenderness: There is no abdominal tenderness.  Genitourinary:    Comments: Foley  Musculoskeletal: Normal range of motion.     Comments: He has bilateral lower extremity numbness  Has significant  bilateral lower extremity weakness       3+ bilateral lower extremity edema  Ace wraps in place      Lymphadenopathy:     Cervical: No cervical adenopathy.  Skin:    General: Skin is warm and dry.  Neurological:     Mental Status: He is alert and oriented to person, place, and time.  Psychiatric:  Mood and Affect: Mood normal.       ASSESSMENT/ PLAN:  TODAY   1. Chronic anxiety: is without change in his status; at this time will continue klonopin 1 mg three times daily it is not appropriate to attempt another dose reduction at this time; will reconsider changes after his upcoming surgery.   MD is aware of resident's narcotic use and is in  agreement with current plan of care. We will attempt to wean resident as appropriate.  Ok Edwards NP [facility]  Contact [phone] Monday through Friday 8am- 5pm  After hours call [phone]

## 2018-11-23 DIAGNOSIS — F419 Anxiety disorder, unspecified: Secondary | ICD-10-CM | POA: Insufficient documentation

## 2018-11-26 ENCOUNTER — Other Ambulatory Visit (HOSPITAL_COMMUNITY): Payer: Self-pay | Admitting: Neurological Surgery

## 2018-11-26 ENCOUNTER — Encounter (HOSPITAL_COMMUNITY): Payer: Self-pay

## 2018-11-26 ENCOUNTER — Other Ambulatory Visit (HOSPITAL_COMMUNITY): Payer: Medicare Other

## 2018-11-26 ENCOUNTER — Ambulatory Visit (HOSPITAL_COMMUNITY)
Admit: 2018-11-26 | Discharge: 2018-11-26 | Disposition: A | Discharge status: Home / Self Care | Payer: Medicare Other | Attending: Neurological Surgery | Admitting: Neurological Surgery

## 2018-11-26 ENCOUNTER — Other Ambulatory Visit: Payer: Self-pay

## 2018-11-26 ENCOUNTER — Inpatient Hospital Stay (HOSPITAL_COMMUNITY)
Admission: AD | Admit: 2018-11-26 | Discharge: 2018-12-17 | DRG: 055 | Disposition: A | Discharge status: Skilled Nursing Facility | Payer: Medicare Other | Source: Ambulatory Visit | Attending: Internal Medicine | Admitting: Internal Medicine

## 2018-11-26 DIAGNOSIS — Z950 Presence of cardiac pacemaker: Secondary | ICD-10-CM

## 2018-11-26 DIAGNOSIS — I110 Hypertensive heart disease with heart failure: Secondary | ICD-10-CM | POA: Diagnosis present

## 2018-11-26 DIAGNOSIS — I252 Old myocardial infarction: Secondary | ICD-10-CM

## 2018-11-26 DIAGNOSIS — Z885 Allergy status to narcotic agent status: Secondary | ICD-10-CM

## 2018-11-26 DIAGNOSIS — G8220 Paraplegia, unspecified: Secondary | ICD-10-CM | POA: Diagnosis present

## 2018-11-26 DIAGNOSIS — M48062 Spinal stenosis, lumbar region with neurogenic claudication: Secondary | ICD-10-CM | POA: Diagnosis present

## 2018-11-26 DIAGNOSIS — E440 Moderate protein-calorie malnutrition: Secondary | ICD-10-CM | POA: Diagnosis present

## 2018-11-26 DIAGNOSIS — Z7189 Other specified counseling: Secondary | ICD-10-CM | POA: Diagnosis not present

## 2018-11-26 DIAGNOSIS — Z85828 Personal history of other malignant neoplasm of skin: Secondary | ICD-10-CM

## 2018-11-26 DIAGNOSIS — Z8249 Family history of ischemic heart disease and other diseases of the circulatory system: Secondary | ICD-10-CM | POA: Diagnosis not present

## 2018-11-26 DIAGNOSIS — M19042 Primary osteoarthritis, left hand: Secondary | ICD-10-CM | POA: Diagnosis present

## 2018-11-26 DIAGNOSIS — C7949 Secondary malignant neoplasm of other parts of nervous system: Secondary | ICD-10-CM | POA: Diagnosis present

## 2018-11-26 DIAGNOSIS — G834 Cauda equina syndrome: Secondary | ICD-10-CM | POA: Diagnosis present

## 2018-11-26 DIAGNOSIS — Z66 Do not resuscitate: Secondary | ICD-10-CM | POA: Diagnosis not present

## 2018-11-26 DIAGNOSIS — Z9889 Other specified postprocedural states: Secondary | ICD-10-CM

## 2018-11-26 DIAGNOSIS — G9520 Unspecified cord compression: Secondary | ICD-10-CM | POA: Diagnosis not present

## 2018-11-26 DIAGNOSIS — G893 Neoplasm related pain (acute) (chronic): Secondary | ICD-10-CM | POA: Diagnosis not present

## 2018-11-26 DIAGNOSIS — Z87891 Personal history of nicotine dependence: Secondary | ICD-10-CM

## 2018-11-26 DIAGNOSIS — I442 Atrioventricular block, complete: Secondary | ICD-10-CM | POA: Diagnosis present

## 2018-11-26 DIAGNOSIS — E871 Hypo-osmolality and hyponatremia: Secondary | ICD-10-CM | POA: Diagnosis not present

## 2018-11-26 DIAGNOSIS — M170 Bilateral primary osteoarthritis of knee: Secondary | ICD-10-CM | POA: Diagnosis present

## 2018-11-26 DIAGNOSIS — L89152 Pressure ulcer of sacral region, stage 2: Secondary | ICD-10-CM | POA: Diagnosis not present

## 2018-11-26 DIAGNOSIS — E222 Syndrome of inappropriate secretion of antidiuretic hormone: Secondary | ICD-10-CM | POA: Diagnosis present

## 2018-11-26 DIAGNOSIS — Z801 Family history of malignant neoplasm of trachea, bronchus and lung: Secondary | ICD-10-CM | POA: Diagnosis not present

## 2018-11-26 DIAGNOSIS — I480 Paroxysmal atrial fibrillation: Secondary | ICD-10-CM | POA: Diagnosis not present

## 2018-11-26 DIAGNOSIS — Z85820 Personal history of malignant melanoma of skin: Secondary | ICD-10-CM

## 2018-11-26 DIAGNOSIS — L8942 Pressure ulcer of contiguous site of back, buttock and hip, stage 2: Secondary | ICD-10-CM | POA: Diagnosis not present

## 2018-11-26 DIAGNOSIS — Z923 Personal history of irradiation: Secondary | ICD-10-CM

## 2018-11-26 DIAGNOSIS — N400 Enlarged prostate without lower urinary tract symptoms: Secondary | ICD-10-CM | POA: Diagnosis present

## 2018-11-26 DIAGNOSIS — C799 Secondary malignant neoplasm of unspecified site: Secondary | ICD-10-CM

## 2018-11-26 DIAGNOSIS — F419 Anxiety disorder, unspecified: Secondary | ICD-10-CM | POA: Diagnosis present

## 2018-11-26 DIAGNOSIS — M19041 Primary osteoarthritis, right hand: Secondary | ICD-10-CM | POA: Diagnosis present

## 2018-11-26 DIAGNOSIS — Z7901 Long term (current) use of anticoagulants: Secondary | ICD-10-CM

## 2018-11-26 DIAGNOSIS — Z515 Encounter for palliative care: Secondary | ICD-10-CM

## 2018-11-26 DIAGNOSIS — C3431 Malignant neoplasm of lower lobe, right bronchus or lung: Secondary | ICD-10-CM | POA: Diagnosis present

## 2018-11-26 DIAGNOSIS — R609 Edema, unspecified: Secondary | ICD-10-CM | POA: Diagnosis not present

## 2018-11-26 DIAGNOSIS — I4821 Permanent atrial fibrillation: Secondary | ICD-10-CM | POA: Diagnosis present

## 2018-11-26 DIAGNOSIS — I2510 Atherosclerotic heart disease of native coronary artery without angina pectoris: Secondary | ICD-10-CM | POA: Diagnosis not present

## 2018-11-26 DIAGNOSIS — E785 Hyperlipidemia, unspecified: Secondary | ICD-10-CM | POA: Diagnosis present

## 2018-11-26 DIAGNOSIS — L8990 Pressure ulcer of unspecified site, unspecified stage: Secondary | ICD-10-CM | POA: Insufficient documentation

## 2018-11-26 DIAGNOSIS — I5032 Chronic diastolic (congestive) heart failure: Secondary | ICD-10-CM | POA: Diagnosis present

## 2018-11-26 DIAGNOSIS — D696 Thrombocytopenia, unspecified: Secondary | ICD-10-CM | POA: Diagnosis present

## 2018-11-26 DIAGNOSIS — H353190 Nonexudative age-related macular degeneration, unspecified eye, stage unspecified: Secondary | ICD-10-CM | POA: Diagnosis present

## 2018-11-26 DIAGNOSIS — L89312 Pressure ulcer of right buttock, stage 2: Secondary | ICD-10-CM | POA: Diagnosis not present

## 2018-11-26 DIAGNOSIS — Z23 Encounter for immunization: Secondary | ICD-10-CM

## 2018-11-26 DIAGNOSIS — C3490 Malignant neoplasm of unspecified part of unspecified bronchus or lung: Secondary | ICD-10-CM | POA: Diagnosis not present

## 2018-11-26 DIAGNOSIS — Z20828 Contact with and (suspected) exposure to other viral communicable diseases: Secondary | ICD-10-CM | POA: Diagnosis present

## 2018-11-26 DIAGNOSIS — R008 Other abnormalities of heart beat: Secondary | ICD-10-CM | POA: Diagnosis not present

## 2018-11-26 DIAGNOSIS — Z6825 Body mass index (BMI) 25.0-25.9, adult: Secondary | ICD-10-CM

## 2018-11-26 LAB — CBC WITH DIFFERENTIAL/PLATELET
Abs Immature Granulocytes: 0.03 10*3/uL (ref 0.00–0.07)
Basophils Absolute: 0 10*3/uL (ref 0.0–0.1)
Basophils Relative: 0 %
Eosinophils Absolute: 0.5 10*3/uL (ref 0.0–0.5)
Eosinophils Relative: 6 %
HCT: 42.6 % (ref 39.0–52.0)
Hemoglobin: 14.6 g/dL (ref 13.0–17.0)
Immature Granulocytes: 0 %
Lymphocytes Relative: 6 %
Lymphs Abs: 0.5 10*3/uL — ABNORMAL LOW (ref 0.7–4.0)
MCH: 29.2 pg (ref 26.0–34.0)
MCHC: 34.3 g/dL (ref 30.0–36.0)
MCV: 85.2 fL (ref 80.0–100.0)
Monocytes Absolute: 0.7 10*3/uL (ref 0.1–1.0)
Monocytes Relative: 8 %
Neutro Abs: 6.9 10*3/uL (ref 1.7–7.7)
Neutrophils Relative %: 80 %
Platelets: 219 10*3/uL (ref 150–400)
RBC: 5 MIL/uL (ref 4.22–5.81)
RDW: 13.9 % (ref 11.5–15.5)
WBC: 8.6 10*3/uL (ref 4.0–10.5)
nRBC: 0 % (ref 0.0–0.2)

## 2018-11-26 LAB — COMPREHENSIVE METABOLIC PANEL
ALT: 15 U/L (ref 0–44)
AST: 16 U/L (ref 15–41)
Albumin: 3.1 g/dL — ABNORMAL LOW (ref 3.5–5.0)
Alkaline Phosphatase: 103 U/L (ref 38–126)
Anion gap: 10 (ref 5–15)
BUN: 14 mg/dL (ref 8–23)
CO2: 26 mmol/L (ref 22–32)
Calcium: 8.4 mg/dL — ABNORMAL LOW (ref 8.9–10.3)
Chloride: 88 mmol/L — ABNORMAL LOW (ref 98–111)
Creatinine, Ser: 0.72 mg/dL (ref 0.61–1.24)
GFR calc Af Amer: >60 mL/min (ref >60)
GFR calc non Af Amer: >60 mL/min (ref >60)
Glucose, Bld: 145 mg/dL — ABNORMAL HIGH (ref 70–99)
Potassium: 4 mmol/L (ref 3.5–5.1)
Sodium: 124 mmol/L — ABNORMAL LOW (ref 135–145)
Total Bilirubin: 1 mg/dL (ref 0.3–1.2)
Total Protein: 6.3 g/dL — ABNORMAL LOW (ref 6.5–8.1)

## 2018-11-26 MED ORDER — ONDANSETRON HCL 4 MG/2ML IJ SOLN: 4.0000 mg | Freq: Four times a day (QID) | INTRAMUSCULAR | Status: DC | PRN

## 2018-11-26 MED ORDER — ENOXAPARIN SODIUM 40 MG/0.4ML ~~LOC~~ SOLN
40.0000 mg | SUBCUTANEOUS | Status: DC
  Administered 2018-11-26 – 2018-11-27 (×2): 40 mg via SUBCUTANEOUS
  Filled 2018-11-26 (×2): qty 0.4

## 2018-11-26 MED ORDER — FUROSEMIDE 40 MG PO TABS: 40.0000 mg | ORAL_TABLET | Freq: Every day | ORAL | Status: DC

## 2018-11-26 MED ORDER — SODIUM CHLORIDE 0.9 % IV SOLN
INTRAVENOUS | Status: AC
  Administered 2018-11-27: via INTRAVENOUS

## 2018-11-26 MED ORDER — METOPROLOL TARTRATE 25 MG PO TABS
25.0000 mg | ORAL_TABLET | Freq: Every day | ORAL | Status: DC
  Administered 2018-11-27: 11:00:00 25 mg via ORAL
  Filled 2018-11-26: qty 1

## 2018-11-26 MED ORDER — FUROSEMIDE 10 MG/ML IJ SOLN
20.0000 mg | Freq: Every day | INTRAMUSCULAR | Status: DC
  Administered 2018-11-27 – 2018-11-30 (×4): 20 mg via INTRAVENOUS
  Filled 2018-11-26 (×4): qty 4

## 2018-11-26 MED ORDER — TAMSULOSIN HCL 0.4 MG PO CAPS
0.4000 mg | ORAL_CAPSULE | Freq: Every evening | ORAL | Status: DC
  Administered 2018-11-26 – 2018-12-16 (×21): 0.4 mg via ORAL
  Filled 2018-11-26 (×21): qty 1

## 2018-11-26 MED ORDER — IPRATROPIUM-ALBUTEROL 0.5-2.5 (3) MG/3ML IN SOLN
RESPIRATORY_TRACT | Status: AC
  Administered 2018-11-26: 20:00:00 3 mL via RESPIRATORY_TRACT
  Filled 2018-11-26: qty 3

## 2018-11-26 MED ORDER — DIAZEPAM 5 MG PO TABS
10.0000 mg | ORAL_TABLET | Freq: Once | ORAL | Status: AC
  Administered 2018-11-26: 10:00:00 10 mg via ORAL

## 2018-11-26 MED ORDER — HYDROCODONE-ACETAMINOPHEN 5-325 MG PO TABS
1.0000 | ORAL_TABLET | ORAL | Status: DC | PRN
  Administered 2018-11-26: 1 via ORAL

## 2018-11-26 MED ORDER — SPIRONOLACTONE 25 MG PO TABS
25.0000 mg | ORAL_TABLET | Freq: Every day | ORAL | Status: DC
  Administered 2018-11-27 – 2018-12-17 (×21): 25 mg via ORAL
  Filled 2018-11-26 (×21): qty 1

## 2018-11-26 MED ORDER — LIDOCAINE HCL (PF) 1 % IJ SOLN
INTRAMUSCULAR | Status: AC
  Filled 2018-11-26: qty 5

## 2018-11-26 MED ORDER — IPRATROPIUM-ALBUTEROL 0.5-2.5 (3) MG/3ML IN SOLN
3.0000 mL | Freq: Two times a day (BID) | RESPIRATORY_TRACT | Status: DC
  Administered 2018-11-26 – 2018-12-17 (×43): 3 mL via RESPIRATORY_TRACT
  Filled 2018-11-26 (×41): qty 3

## 2018-11-26 MED ORDER — MAGNESIUM HYDROXIDE 400 MG/5ML PO SUSP: 30.0000 mL | Freq: Every day | ORAL | Status: DC | PRN

## 2018-11-26 MED ORDER — NITROGLYCERIN 0.4 MG SL SUBL: 0.4000 mg | SUBLINGUAL_TABLET | SUBLINGUAL | Status: DC | PRN

## 2018-11-26 MED ORDER — ACETAMINOPHEN 325 MG PO TABS
650.0000 mg | ORAL_TABLET | Freq: Four times a day (QID) | ORAL | Status: DC | PRN
  Administered 2018-11-29 – 2018-12-09 (×13): 650 mg via ORAL
  Filled 2018-11-26 (×14): qty 2

## 2018-11-26 MED ORDER — HYDROCODONE-ACETAMINOPHEN 5-325 MG PO TABS
1.0000 | ORAL_TABLET | ORAL | Status: DC | PRN
  Administered 2018-11-28: 10:00:00 1 via ORAL
  Administered 2018-11-29: 2 via ORAL
  Administered 2018-12-02 – 2018-12-06 (×4): 1 via ORAL
  Administered 2018-12-07: 2 via ORAL
  Filled 2018-11-26: qty 1
  Filled 2018-11-26: qty 2
  Filled 2018-11-26 (×4): qty 1
  Filled 2018-11-26: qty 2

## 2018-11-26 MED ORDER — CLONAZEPAM 0.5 MG PO TABS
1.0000 mg | ORAL_TABLET | Freq: Three times a day (TID) | ORAL | Status: DC
  Administered 2018-11-26 – 2018-12-10 (×41): 1 mg via ORAL
  Filled 2018-11-26 (×41): qty 2

## 2018-11-26 MED ORDER — PRO-STAT SUGAR FREE PO LIQD
30.0000 mL | Freq: Two times a day (BID) | ORAL | Status: DC
  Administered 2018-11-26 – 2018-12-04 (×16): 30 mL via ORAL
  Filled 2018-11-26 (×16): qty 30

## 2018-11-26 MED ORDER — HYDROCODONE-ACETAMINOPHEN 5-325 MG PO TABS
ORAL_TABLET | ORAL | Status: AC
  Filled 2018-11-26: qty 1

## 2018-11-26 MED ORDER — GABAPENTIN 100 MG PO CAPS
100.0000 mg | ORAL_CAPSULE | Freq: Every day | ORAL | Status: DC
  Administered 2018-11-26 – 2018-12-09 (×14): 100 mg via ORAL
  Filled 2018-11-26 (×14): qty 1

## 2018-11-26 MED ORDER — DIAZEPAM 5 MG PO TABS
ORAL_TABLET | ORAL | Status: AC
  Filled 2018-11-26: qty 2

## 2018-11-26 MED ORDER — IOPAMIDOL (ISOVUE-200) INJECTION 41%
50.0000 mL | Freq: Once | INTRAVENOUS | Status: AC | PRN
  Administered 2018-11-26: 12 mL via INTRAVENOUS
  Filled 2018-11-26: qty 50

## 2018-11-26 MED ORDER — ATORVASTATIN CALCIUM 20 MG PO TABS
20.0000 mg | ORAL_TABLET | Freq: Every day | ORAL | Status: DC
  Administered 2018-11-27 – 2018-12-16 (×20): 20 mg via ORAL
  Filled 2018-11-26: qty 2
  Filled 2018-11-26 (×3): qty 1
  Filled 2018-11-26: qty 2
  Filled 2018-11-26: qty 1
  Filled 2018-11-26 (×2): qty 2
  Filled 2018-11-26 (×3): qty 1
  Filled 2018-11-26: qty 2
  Filled 2018-11-26 (×3): qty 1
  Filled 2018-11-26: qty 2
  Filled 2018-11-26 (×3): qty 1
  Filled 2018-11-26: qty 2
  Filled 2018-11-26: qty 1
  Filled 2018-11-26: qty 2

## 2018-11-26 MED ORDER — LIDOCAINE HCL (PF) 1 % IJ SOLN: 2.0000 mL | Freq: Once | INTRAMUSCULAR | Status: DC

## 2018-11-26 NOTE — H&P (Signed)
Benjamin [name] is an 73 y.o. male.   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  About 4 months ago he underwent surgical decompression of the severe stenosis and he was sent off to rehab despite efforts at rehab his legs have become progressively weaker.  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  Patient has a longstanding history of lung cancer that he has been on several occasions now.  He is being admitted to undergo further work-up and possibly obtaining an MRI.  The patient has an implanted pacemaker and cardiology will be consulted to see what can be done to obtain an MRI safely.  Past Medical History:  Diagnosis Date  . Adenopathy    RIGHT ADRENAL  . Anxiety   . Arthritis    KNEES/HANDS  . CAD (coronary artery disease)    a. Prior known CTO of LCX with recanalizationby cath 2008;  b. 09/2012 NSTEMI/Cath/PCI: LM nl, LAD 76m, D1 50p, LCX 100p (2.75x16 Promus Premier DES), 80-70m, OM1 30, RCA 37m (3.0x16 Promus Premier DES), EF 50-55%.  [name] History of echocardiogram    Echo 2/18: Moderate LVH, EF 55-60, normal wall motion, trivial AI, moderate LAE, mild RVE, mild RAE, mildly elevated pulmonary pressure  . Hx of melanoma of skin   . 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 Malignant neoplasm of hilus of lung (HCC)    NON-SMALL-CELL    . Non-small cell lung cancer ([place])    a. s/p resection and XRT.  [name] PAF (paroxysmal atrial fibrillation) ([place])    a. not currently on anticoagulation (09/2012)  . Presence of permanent cardiac pacemaker   . 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 Uses walker     Past Surgical History:  Procedure Laterality Date  . ADRENALEXTOMY  02/2000  . CARDIAC CATHETERIZATION  2008   LAD 40, D1 OK, D2  50, CFX 90, RCA OK, EF 60%; Consider PCI PRN but the stenosis is quite complex and we would end up jailing several large posterolateral branches    . HERNIA REPAIR  05/27/2018  . INGUINAL HERNIA REPAIR Right 05/27/2018   Procedure: INCARCERATED RIGHT INGUINAL HERNIA REPAIR;  Surgeon: Erroll Luna, MD;  Location: [place];  Service: General;  Laterality: Right;  . LEFT HEART CATHETERIZATION WITH CORONARY ANGIOGRAM N/A 10/17/2012   Procedure: LEFT HEART CATHETERIZATION WITH CORONARY ANGIOGRAM;  Surgeon: Sherren Mocha, MD;  Location: [facility] CATH LAB;  Service: Cardiovascular;  Laterality: N/A;  . LOBECTOMY LEFT UPPER LOBE  08/15/2001  . LUMBAR LAMINECTOMY/DECOMPRESSION MICRODISCECTOMY N/A 07/25/2018   Procedure: Lumbar Four-Five Laminectomy;  Surgeon: Kristeen Miss, MD;  Location: [place];  Service: Neurosurgery;  Laterality: N/A;  Lumbar Four-Five Laminectomy  . PACEMAKER IMPLANT N/A 08/29/2016   Procedure: Pacemaker Implant;  Surgeon: Evans Lance, MD;  Location: [place] CV LAB;  Service: Cardiovascular;  Laterality: N/A;  . Right VATS, mini thoracotomy, wedge resection of right upper  01/18/2004    Family History  Problem Relation Age of Onset  . Heart attack Father   . Cancer Mother        lung  . Cancer Cousin        breast   Social History:  reports that he quit smoking about 17 years ago. His smoking use  included cigarettes. He has a 40.00 pack-year smoking history. He has never used smokeless tobacco. He reports that he does not drink alcohol or use drugs.  Allergies:  Allergies  Allergen Reactions  . Codeine Other (See Comments)    Dizziness, stomach pain    Medications Prior to Admission  Medication Sig Dispense Refill  . acetaminophen (TYLENOL) 325 MG tablet Take 2 tablets (650 mg total) by mouth every 6 (six) hours as needed for mild pain (or temp > 100).    [name] apixaban (ELIQUIS) 5 MG TABS tablet Take 5 mg by mouth 2 (two) times daily.    [name] atorvastatin (LIPITOR) 20 MG tablet  Take 20 mg by mouth daily.     Roseanne Kaufman Peru-Castor Oil (VENELEX) OINT Apply topically to sacrum every shift until resolved    . clonazePAM (KLONOPIN) 1 MG tablet Take 1 tablet (1 mg total) by mouth 3 (three) times daily. 90 tablet 0  . furosemide (LASIX) 40 MG tablet Take 40 mg by mouth daily.    [name] gabapentin (NEURONTIN) 100 MG capsule Take 100 mg by mouth at bedtime.    [name] ipratropium-albuterol (DUONEB) 0.5-2.5 (3) MG/3ML SOLN Take 3 mLs by nebulization 2 (two) times daily.    . magnesium hydroxide (MILK OF MAGNESIA) 400 MG/5ML suspension Take 30 mLs by mouth daily as needed for mild constipation.    . metoprolol tartrate (LOPRESSOR) 25 MG tablet Take 25 mg by mouth daily.    . nitroGLYCERIN (NITROSTAT) 0.4 MG SL tablet Place 1 tablet (0.4 mg total) under the tongue every 5 (five) minutes x 3 doses as needed for chest pain. 25 tablet 3  . NON FORMULARY Diet Type: Regular,  NAS, Consistent Carbohydrate    . NON FORMULARY ACE Wraps to BLE d/t increased edema Special Instructions: On during the day off at [facility] Day    . polyethylene glycol (MIRALAX / GLYCOLAX) 17 g packet Take 17 g by mouth daily.    [name] spironolactone (ALDACTONE) 25 MG tablet Take 25 mg by mouth daily.    . tamsulosin (FLOMAX) 0.4 MG CAPS capsule Take 0.4 mg by mouth every evening.      No results found for this or any previous visit (from the past 48 hour(s)). Ct Thoracic Spine W Contrast  Result Date: 11/26/2018 CLINICAL DATA:  73 year old male with grade 2 lower lumbar spondylolisthesis status post posterior decompression of the lumbar spine in May this year. Evidence of solid ankylosis at the L4-L5 spondylolisthesis level, however worsening weakness and neurologic symptoms since the time of surgery. History of lung cancer with multiple recurrences. FLUOROSCOPY TIME:  1 minutes 12 seconds PROCEDURE: LUMBAR PUNCTURE FOR THORACIC AND LUMBAR MYELOGRAM Lumbar puncture and intrathecal contrast administration were performed by Dr.  Kristeen Miss who will separately report for the portion of the procedure. I personally supervised acquisition of the myelogram images. COMPARISON:  Postoperative lumbar spine CT 09/26/2018. Preoperative CT 07/10/2018. chest CT 07/10/2018. TECHNIQUE: Contiguous axial images were obtained through the Thoracic and Lumbar spine after the intrathecal infusion of infusion. Coronal and sagittal reconstructions were obtained of the axial image sets. FINDINGS: MYELOGRAM FINDINGS: Lumbar puncture was performed at the upper lumbar level. On the initial postcontrast images there is 5-6 millimeter round filling defect occupying the left half of the thecal sac at the L3 vertebral body level. This became more difficult to see over the course of the myelographic images and will be reported in the lumbar CT below. Stable spondylolisthesis of L4 on  L5. Stable vertebral height and alignment elsewhere including mild retrolisthesis of L1 on L2. Good lumbar intrathecal sac opacification, with no lumbar spinal stenosis from L2 to the sacrum. There is a defect on the ventral thecal sac at L1-L2 related to chronic disc osteophyte complex, appearing similar to that expected based on the July CT. A 2nd lateral view in mild Trendelenburg demonstrates contrast flow beyond this level, and normal appearing thecal sac patency at T12-L1. See additional post myelogram CT findings of the thoracic spine below. CT THORACIC MYELOGRAM FINDINGS: Decision was made following the acquisition of plain lumbar myelogram images to obtain CT post myelogram images also of the thoracic spine. Thoracic spine images were then obtained in a delayed fashion at 1241 hours (versus CT acquisition at 1133 hours of the lumbar spine. Only trace myelographic contrast is present in the thoracic spine, and seems to be mostly subdural space contrast. However, on axial series 7 images 68 through 110 there does appear to be a small amount of intrathecal thoracic contrast  delineating a nonenlarged thoracic spinal cord from the T7 through the T10 level. Additionally the conus from T11-T12 continues to have an unusual post myelographic appearance which seems unlikely just to the mixed contrast injection (over an hour delayed from the lumbar images). And furthermore there remains a nodular filling defect in the right thecal sac at T12 (in addition to the multifocal cauda equina nodularity described below). There is no evidence of degenerative thoracic spinal stenosis. No acute or suspicious osseous lesion is identified in the thoracic spine. Persistent masslike opacity at the left hilum and in the visible posterior left upper lung pleural space. Partially visible persistent right upper lobe curvilinear confluent opacity on series 6, image 40. No new or increased mediastinal lymph nodes are identified. CT LUMBAR MYELOGRAM FINDINGS: Stable visible abdominal viscera including Calcified aortic atherosclerosis. And right adrenal region surgical clips. Stable posterior paraspinal soft tissues. No acute osseous abnormality identified. Stable posterior postoperative changes at L3-L4 and L4-L5. Solid interbody ankylosis redemonstrated at L4-L5. Intact visible sacrum and SI joints. T12-L1: The tip of the conus is at this level, above which there is only trace subdural intraspinal contrast. A filling defect in the right thecal sac on series 5, image 41 was initially felt probably related to the mixed injection surrounding the exiting right T12 nerve - although that seems less likely given the persistence on the delayed thoracic CT described above. No spinal stenosis at this level, minor disc bulging and endplate spurring appear stable since July. L1-L2: Chronic mild retrolisthesis with disc space loss and circumferential disc osteophyte complex. Mild spinal stenosis and mild to moderate left lateral recess stenosis (series 5, image 57). Moderate bilateral L1 foraminal stenosis. This level appears  stable since July. L2-L3: Circumferential disc bulge and mild to moderate posterior element hypertrophy. No spinal stenosis. Borderline to mild left lateral recess stenosis. Mild to moderate left and borderline to mild right L2 foraminal stenosis. L3: Posterior to the L3 vertebral body to the left of midline there is a small 4-5 millimeter low-density filling defect associated with the cauda equina (series 5, image 80 and sagittal series 8, image 35), around which there is mildly hyperdense contrast, which appears similar to the epidural/subdural contrast also in this region (see bilateral exiting L3 nerve roots). Just cephalad of this lesion and near the midline there is a subtle 2 millimeter area of thickening of the cauda equina (series 5, image 76). L3-L4: Posterior decompression of this level. Circumferential disc  osteophyte complex and residual posterior element hypertrophy with widely patent thecal sac. Borderline to mild bilateral L3 foraminal stenosis. L4-L5: Chronic grade 2 spondylolisthesis with interbody ankylosis and posterior decompression. There is mildly irregular thickening of multiple cauda equina nerve roots also at this level (series 5, image 95) most of which are not displaced outwardly along the walls of the thecal sac. No spinal stenosis. No lateral recess stenosis. Moderate to severe bilateral L4 foraminal stenosis which is probably greater on the right. L5-S1:  Mild disc bulge and endplate spurring without stenosis. IMPRESSION: 1. 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 Mild chronic appearing lumbar spinal stenosis at L1-L2 is likely stable since that time, with decompressed L3-L4 and L4-L5 levels and no other significant lumbar spinal stenosis. 2. Non-specific thickening and nodularity of the cauda equina at the T12, L3, and L4 levels, with a superimposed unusual and bulbous appearance of the conus medullaris,  which though initially felt related to a mixed contrast injection at the time of lumbar CT images (1133 hours) remained unchanged on delayed thoracic spine images at 1241 hours. And with no interval cephalad migration of intrathecal contrast, despite absent CT evidence of any thoracic spinal stenosis. 3. This constellation of clinical and imaging findings therefore raises the possibility of intrathecal metastatic disease with involvement of the conus and cauda equina. These studies were discussed by telephone with Dr. Kristeen Miss on 11/26/2018 at 13:20. Electronically Signed   By: Genevie Ann M.D.   On: 11/26/2018 13:25   Ct Lumbar Spine W Contrast  Result Date: 11/26/2018 CLINICAL DATA:  73 year old male with grade 2 lower lumbar spondylolisthesis status post posterior decompression of the lumbar spine in May this year. Evidence of solid ankylosis at the L4-L5 spondylolisthesis level, however worsening weakness and neurologic symptoms since the time of surgery. History of lung cancer with multiple recurrences. FLUOROSCOPY TIME:  1 minutes 12 seconds PROCEDURE: LUMBAR PUNCTURE FOR THORACIC AND LUMBAR MYELOGRAM Lumbar puncture and intrathecal contrast administration were performed by Dr. Kristeen Miss who will separately report for the portion of the procedure. I personally supervised acquisition of the myelogram images. COMPARISON:  Postoperative lumbar spine CT 09/26/2018. Preoperative CT 07/10/2018. chest CT 07/10/2018. TECHNIQUE: Contiguous axial images were obtained through the Thoracic and Lumbar spine after the intrathecal infusion of infusion. Coronal and sagittal reconstructions were obtained of the axial image sets. FINDINGS: MYELOGRAM FINDINGS: Lumbar puncture was performed at the upper lumbar level. On the initial postcontrast images there is 5-6 millimeter round filling defect occupying the left half of the thecal sac at the L3 vertebral body level. This became more difficult to see over the course of the  myelographic images and will be reported in the lumbar CT below. Stable spondylolisthesis of L4 on L5. Stable vertebral height and alignment elsewhere including mild retrolisthesis of L1 on L2. Good lumbar intrathecal sac opacification, with no lumbar spinal stenosis from L2 to the sacrum. There is a defect on the ventral thecal sac at L1-L2 related to chronic disc osteophyte complex, appearing similar to that expected based on the July CT. A 2nd lateral view in mild Trendelenburg demonstrates contrast flow beyond this level, and normal appearing thecal sac patency at T12-L1. See additional post myelogram CT findings of the thoracic spine below. CT THORACIC MYELOGRAM FINDINGS: Decision was made following the acquisition of plain lumbar myelogram images to obtain CT post myelogram images also of the thoracic spine. Thoracic spine images were  then obtained in a delayed fashion at 1241 hours (versus CT acquisition at 1133 hours of the lumbar spine. Only trace myelographic contrast is present in the thoracic spine, and seems to be mostly subdural space contrast. However, on axial series 7 images 68 through 110 there does appear to be a small amount of intrathecal thoracic contrast delineating a nonenlarged thoracic spinal cord from the T7 through the T10 level. Additionally the conus from T11-T12 continues to have an unusual post myelographic appearance which seems unlikely just to the mixed contrast injection (over an hour delayed from the lumbar images). And furthermore there remains a nodular filling defect in the right thecal sac at T12 (in addition to the multifocal cauda equina nodularity described below). There is no evidence of degenerative thoracic spinal stenosis. No acute or suspicious osseous lesion is identified in the thoracic spine. Persistent masslike opacity at the left hilum and in the visible posterior left upper lung pleural space. Partially visible persistent right upper lobe curvilinear confluent  opacity on series 6, image 40. No new or increased mediastinal lymph nodes are identified. CT LUMBAR MYELOGRAM FINDINGS: Stable visible abdominal viscera including Calcified aortic atherosclerosis. And right adrenal region surgical clips. Stable posterior paraspinal soft tissues. No acute osseous abnormality identified. Stable posterior postoperative changes at L3-L4 and L4-L5. Solid interbody ankylosis redemonstrated at L4-L5. Intact visible sacrum and SI joints. T12-L1: The tip of the conus is at this level, above which there is only trace subdural intraspinal contrast. A filling defect in the right thecal sac on series 5, image 41 was initially felt probably related to the mixed injection surrounding the exiting right T12 nerve - although that seems less likely given the persistence on the delayed thoracic CT described above. No spinal stenosis at this level, minor disc bulging and endplate spurring appear stable since July. L1-L2: Chronic mild retrolisthesis with disc space loss and circumferential disc osteophyte complex. Mild spinal stenosis and mild to moderate left lateral recess stenosis (series 5, image 57). Moderate bilateral L1 foraminal stenosis. This level appears stable since July. L2-L3: Circumferential disc bulge and mild to moderate posterior element hypertrophy. No spinal stenosis. Borderline to mild left lateral recess stenosis. Mild to moderate left and borderline to mild right L2 foraminal stenosis. L3: Posterior to the L3 vertebral body to the left of midline there is a small 4-5 millimeter low-density filling defect associated with the cauda equina (series 5, image 80 and sagittal series 8, image 35), around which there is mildly hyperdense contrast, which appears similar to the epidural/subdural contrast also in this region (see bilateral exiting L3 nerve roots). Just cephalad of this lesion and near the midline there is a subtle 2 millimeter area of thickening of the cauda equina (series 5,  image 76). L3-L4: Posterior decompression of this level. Circumferential disc osteophyte complex and residual posterior element hypertrophy with widely patent thecal sac. Borderline to mild bilateral L3 foraminal stenosis. L4-L5: Chronic grade 2 spondylolisthesis with interbody ankylosis and posterior decompression. There is mildly irregular thickening of multiple cauda equina nerve roots also at this level (series 5, image 95) most of which are not displaced outwardly along the walls of the thecal sac. No spinal stenosis. No lateral recess stenosis. Moderate to severe bilateral L4 foraminal stenosis which is probably greater on the right. L5-S1:  Mild disc bulge and endplate spurring without stenosis. IMPRESSION: 1. 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 Mild chronic appearing lumbar  spinal stenosis at L1-L2 is likely stable since that time, with decompressed L3-L4 and L4-L5 levels and no other significant lumbar spinal stenosis. 2. Non-specific thickening and nodularity of the cauda equina at the T12, L3, and L4 levels, with a superimposed unusual and bulbous appearance of the conus medullaris, which though initially felt related to a mixed contrast injection at the time of lumbar CT images (1133 hours) remained unchanged on delayed thoracic spine images at 1241 hours. And with no interval cephalad migration of intrathecal contrast, despite absent CT evidence of any thoracic spinal stenosis. 3. This constellation of clinical and imaging findings therefore raises the possibility of intrathecal metastatic disease with involvement of the conus and cauda equina. These studies were discussed by telephone with Dr. Kristeen Miss on 11/26/2018 at 13:20. Electronically Signed   By: Genevie Ann M.D.   On: 11/26/2018 13:25   Dg Myelogram Lumbar  Result Date: 11/26/2018 CLINICAL DATA:  73 year old male with grade 2 lower lumbar spondylolisthesis  status post posterior decompression of the lumbar spine in May this year. Evidence of solid ankylosis at the L4-L5 spondylolisthesis level, however worsening weakness and neurologic symptoms since the time of surgery. History of lung cancer with multiple recurrences. FLUOROSCOPY TIME:  1 minutes 12 seconds PROCEDURE: LUMBAR PUNCTURE FOR THORACIC AND LUMBAR MYELOGRAM Lumbar puncture and intrathecal contrast administration were performed by Dr. Kristeen Miss who will separately report for the portion of the procedure. I personally supervised acquisition of the myelogram images. COMPARISON:  Postoperative lumbar spine CT 09/26/2018. Preoperative CT 07/10/2018. chest CT 07/10/2018. TECHNIQUE: Contiguous axial images were obtained through the Thoracic and Lumbar spine after the intrathecal infusion of infusion. Coronal and sagittal reconstructions were obtained of the axial image sets. FINDINGS: MYELOGRAM FINDINGS: Lumbar puncture was performed at the upper lumbar level. On the initial postcontrast images there is 5-6 millimeter round filling defect occupying the left half of the thecal sac at the L3 vertebral body level. This became more difficult to see over the course of the myelographic images and will be reported in the lumbar CT below. Stable spondylolisthesis of L4 on L5. Stable vertebral height and alignment elsewhere including mild retrolisthesis of L1 on L2. Good lumbar intrathecal sac opacification, with no lumbar spinal stenosis from L2 to the sacrum. There is a defect on the ventral thecal sac at L1-L2 related to chronic disc osteophyte complex, appearing similar to that expected based on the July CT. A 2nd lateral view in mild Trendelenburg demonstrates contrast flow beyond this level, and normal appearing thecal sac patency at T12-L1. See additional post myelogram CT findings of the thoracic spine below. CT THORACIC MYELOGRAM FINDINGS: Decision was made following the acquisition of plain lumbar myelogram  images to obtain CT post myelogram images also of the thoracic spine. Thoracic spine images were then obtained in a delayed fashion at 1241 hours (versus CT acquisition at 1133 hours of the lumbar spine. Only trace myelographic contrast is present in the thoracic spine, and seems to be mostly subdural space contrast. However, on axial series 7 images 68 through 110 there does appear to be a small amount of intrathecal thoracic contrast delineating a nonenlarged thoracic spinal cord from the T7 through the T10 level. Additionally the conus from T11-T12 continues to have an unusual post myelographic appearance which seems unlikely just to the mixed contrast injection (over an hour delayed from the lumbar images). And furthermore there remains a nodular filling defect in the right thecal sac at T12 (in addition to the  multifocal cauda equina nodularity described below). There is no evidence of degenerative thoracic spinal stenosis. No acute or suspicious osseous lesion is identified in the thoracic spine. Persistent masslike opacity at the left hilum and in the visible posterior left upper lung pleural space. Partially visible persistent right upper lobe curvilinear confluent opacity on series 6, image 40. No new or increased mediastinal lymph nodes are identified. CT LUMBAR MYELOGRAM FINDINGS: Stable visible abdominal viscera including Calcified aortic atherosclerosis. And right adrenal region surgical clips. Stable posterior paraspinal soft tissues. No acute osseous abnormality identified. Stable posterior postoperative changes at L3-L4 and L4-L5. Solid interbody ankylosis redemonstrated at L4-L5. Intact visible sacrum and SI joints. T12-L1: The tip of the conus is at this level, above which there is only trace subdural intraspinal contrast. A filling defect in the right thecal sac on series 5, image 41 was initially felt probably related to the mixed injection surrounding the exiting right T12 nerve - although that  seems less likely given the persistence on the delayed thoracic CT described above. No spinal stenosis at this level, minor disc bulging and endplate spurring appear stable since July. L1-L2: Chronic mild retrolisthesis with disc space loss and circumferential disc osteophyte complex. Mild spinal stenosis and mild to moderate left lateral recess stenosis (series 5, image 57). Moderate bilateral L1 foraminal stenosis. This level appears stable since July. L2-L3: Circumferential disc bulge and mild to moderate posterior element hypertrophy. No spinal stenosis. Borderline to mild left lateral recess stenosis. Mild to moderate left and borderline to mild right L2 foraminal stenosis. L3: Posterior to the L3 vertebral body to the left of midline there is a small 4-5 millimeter low-density filling defect associated with the cauda equina (series 5, image 80 and sagittal series 8, image 35), around which there is mildly hyperdense contrast, which appears similar to the epidural/subdural contrast also in this region (see bilateral exiting L3 nerve roots). Just cephalad of this lesion and near the midline there is a subtle 2 millimeter area of thickening of the cauda equina (series 5, image 76). L3-L4: Posterior decompression of this level. Circumferential disc osteophyte complex and residual posterior element hypertrophy with widely patent thecal sac. Borderline to mild bilateral L3 foraminal stenosis. L4-L5: Chronic grade 2 spondylolisthesis with interbody ankylosis and posterior decompression. There is mildly irregular thickening of multiple cauda equina nerve roots also at this level (series 5, image 95) most of which are not displaced outwardly along the walls of the thecal sac. No spinal stenosis. No lateral recess stenosis. Moderate to severe bilateral L4 foraminal stenosis which is probably greater on the right. L5-S1:  Mild disc bulge and endplate spurring without stenosis. IMPRESSION: 1. No new osseous abnormality in  the lumbar spine since July, with continued solid ankylosis of the L4-L5 spondylolisthesis. Mild chronic appearing lumbar spinal stenosis at L1-L2 is likely stable since that time, with decompressed L3-L4 and L4-L5 levels and no other significant lumbar spinal stenosis. 2. Non-specific thickening and nodularity of the cauda equina at the T12, L3, and L4 levels, with a superimposed unusual and bulbous appearance of the conus medullaris, which though initially felt related to a mixed contrast injection at the time of lumbar CT images (1133 hours) remained unchanged on delayed thoracic spine images at 1241 hours. And with no interval cephalad migration of intrathecal contrast, despite absent CT evidence of any thoracic spinal stenosis. 3. This constellation of clinical and imaging findings therefore raises the possibility of intrathecal metastatic disease with involvement of the conus and cauda  equina. These studies were discussed by telephone with Dr. Kristeen Miss on 11/26/2018 at 13:20. Electronically Signed   By: Genevie Ann M.D.   On: 11/26/2018 13:25    Review of Systems  Constitutional: Negative.   Eyes: Negative.   Respiratory: Positive for shortness of breath.   Cardiovascular: Positive for orthopnea, leg swelling and PND.  Gastrointestinal: Negative.   Genitourinary: Positive for dysuria.  Musculoskeletal: Positive for back pain.  Skin: Negative.   Neurological: Positive for focal weakness.  Endo/Heme/Allergies: Negative.     Blood pressure 124/64, pulse 66. Physical Exam  Constitutional: He is oriented to person, place, and time. He appears well-developed and well-nourished.  HENT:  Head: Normocephalic and atraumatic.  Eyes: Pupils are equal, round, and reactive to light. Conjunctivae and EOM are normal.  Cardiovascular: Normal rate and regular rhythm.  Respiratory: Effort normal and breath sounds normal.  GI: Soft. Bowel sounds are normal.  Neurological: He is oriented to person, place,  and time.  1 out of 5 strength in iliopsoas 2 out of 5 strength in the quads 2 out of 5 strength in the tibialis anterior and gastrocs 1 out of 5 strength in the hamstrings.  Absent reflexes in the patellae and the Achilles.  Sensory level is non-determinant.  Skin: Skin is dry.  Psychiatric: He has a normal mood and affect. Judgment and thought content normal.     Assessment/Plan Progressive paraplegia of both lower extremities.  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  Plan further work-up with MRI of the thoracic spine if able to be done safely otherwise repeat myelography may need to be considered.  Earleen Newport, MD 11/26/2018, 5:02 PM

## 2018-11-26 NOTE — Consult Note (Addendum)
TRH H&P    Patient Demographics:    Benjamin [name], is a 73 y.o. male  MRN: [id number]  DOB - 1945/06/26  Admit Date - 11/26/2018  Referring MD/NP/PA: Kristeen Miss  Outpatient Primary MD for the patient is Hennie Duos, MD Nahser, Arnette Norris - cardiology Cristopher Peru - cardiology Betsy Coder - oncology  Patient coming from:  home  Chief complaint-  Difficulty with progressive weakness in the lower ext   HPI:    Benjamin [name]  is a 73 y.o. male,  w hypertension, hyperlipidemia, CAD s/p DES LCx, PAfib , CHF (EF 55-60%),  CHB h/o pacer, Anxiety, Arthritis, h/o melanoma, h/o lung cancer (bronchoalveolar cell), w Lumbar laminectomy at L4-5 with decompression L4, L5 nerve roots 07/25/2018 presents with progressive weakness of the lower extremities.  Neurosurgery requesting consultation for management of medical issues such as hyponatremia, and lower ext edema.   CT Thoracic, Lumbar spine IMPRESSION: 1. 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 Mild chronic appearing lumbar spinal stenosis at L1-L2 is likely stable since that time, with decompressed L3-L4 and L4-L5 levels and no other significant lumbar spinal stenosis.  2. Non-specific thickening and nodularity of the cauda equina at the T12, L3, and L4 levels, with a superimposed unusual and bulbous appearance of the conus medullaris, which though initially felt related to a mixed contrast injection at the time of lumbar CT images (1133 hours) remained unchanged on delayed thoracic spine images at 1241 hours. And with no interval cephalad migration of intrathecal contrast, despite absent CT evidence of any thoracic spinal stenosis.  3. This constellation of clinical and imaging findings therefore raises the possibility of intrathecal metastatic disease with involvement of  the conus and cauda equina. These studies were discussed by telephone with Dr. Kristeen Miss on 11/26/2018 at 13:20.  Na 124, K 4.0, Bun 14, Creatinine 0.72 Alb 3.1 Ast 16, Alt 15, Alk phos 103, T. Bili 1.0 Wbc 8.6, Hgb 14.6, Plt 219  Neurosurgery asked that medicine act as the primary admitter since pt has significant hyponatremia.    Review of systems:    In addition to the HPI above,  No Fever-chills, No Headache, No changes with Vision or hearing, No problems swallowing food or Liquids, No Chest pain, Cough or Shortness of Breath, No Abdominal pain, No Nausea or Vomiting, bowel movements are regular, No Blood in stool or Urine, No dysuria, No new skin rashes or bruises, No new joints pains-aches,   No recent weight gain or loss, No polyuria, polydypsia or polyphagia, No significant Mental Stressors.  All other systems reviewed and are negative.    Past History of the following :    Past Medical History:  Diagnosis Date   Adenopathy    RIGHT ADRENAL   Anxiety    Arthritis    KNEES/HANDS   CAD (coronary artery disease)    a. Prior known CTO of LCX with recanalizationby cath 2008;  b. 09/2012 NSTEMI/Cath/PCI: LM nl, LAD 102m D1 50p, LCX  100p (2.75x16 Promus Premier DES), 80-34m OM1 30, RCA 916m3.0x16 Promus Premier DES), EF 50-55%.   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 s/p resection and XRT.   PAF (paroxysmal atrial fibrillation) ([facility]   a. 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   Uses walker       Past Surgical History:  Procedure Laterality Date    ADRENALEXTOMY  02/2000   CARDIAC CATHETERIZATION  2008   LAD 40, D1 OK, D2 50, CFX 90, RCA OK, EF 60%; Consider PCI PRN but the stenosis is quite complex and we would end up jailing several large posterolateral branches     HERNIA REPAIR  05/27/2018   INGUINAL HERNIA REPAIR Right 05/27/2018   Procedure: INCARCERATED RIGHT INGUINAL HERNIA REPAIR;  Surgeon: CoErroll LunaMD;  [facility] Service: General;  Laterality: Right;   LEFT HEART CATHETERIZATION WITH CORONARY ANGIOGRAM N/A 10/17/2012   Procedure: LEFT HEART CATHETERIZATION WITH CORONARY ANGIOGRAM;  Surgeon: MiSherren MochaMD;  [facility];  Service: Cardiovascular;  Laterality: N/A;   LOBECTOMY LEFT UPPER LOBE  08/15/2001   LUMBAR LAMINECTOMY/DECOMPRESSION MICRODISCECTOMY N/A 07/25/2018   Procedure: Lumbar Four-Five Laminectomy;  Surgeon: ElKristeen MissMD;  [facility] Service: Neurosurgery;  Laterality: N/A;  Lumbar Four-Five Laminectomy   PACEMAKER IMPLANT N/A 08/29/2016   Procedure: Pacemaker Implant;  Surgeon: TaEvans LanceMD;  [facility];  Service: Cardiovascular;  Laterality: N/A;   Right VATS, mini thoracotomy, wedge resection of right upper  01/18/2004      Social History:      Social History   Tobacco Use   Smoking status: Former Smoker    Packs/day: 1.00    Years: 40.00    Pack years: 40.00    Types: Cigarettes    Quit date: 08/15/2001    Years since quitting: 17.2   Smokeless tobacco: Never Used  Substance Use Topics   Alcohol use: No       Family History :     Family History  Problem Relation Age of Onset   Heart attack Father    Cancer Mother        lung   Cancer Cousin        breast       Home Medications:   Prior to Admission medications   Medication Sig Start Date End Date Taking? Authorizing Provider  acetaminophen (TYLENOL) 325 MG tablet Take 2 tablets (650 mg total) by mouth every 6 (six) hours as needed for mild pain (or temp >  100). 05/28/18   Rayburn, KeFloyce StakesPA-C  apixaban (ELIQUIS) 5 MG TABS tablet Take 5 mg by mouth 2 (two) times daily. 07/31/18   [provider]  atorvastatin (LIPITOR) 20 MG tablet Take 20 mg by mouth daily.     [provider]  BaJanne Labil ([facility] Apply topically to sacrum every shift until resolved 08/28/18   [provider]  clonazePAM (KLONOPIN) 1 MG tablet Take 1 tablet (1 mg total) by  mouth 3 (three) times daily. 11/04/18   Gerlene Fee, NP  furosemide (LASIX) 40 MG tablet Take 40 mg by mouth daily. 09/04/18   [provider]  gabapentin (NEURONTIN) 100 MG capsule Take 100 mg by mouth at bedtime.    [provider]  ipratropium-albuterol (DUONEB) 0.5-2.5 (3) MG/3ML SOLN Take 3 mLs by nebulization 2 (two) times daily.    [provider]  magnesium hydroxide (MILK OF MAGNESIA) 400 MG/5ML suspension Take 30 mLs by mouth daily as needed for mild constipation. 08/20/18   [provider]  metoprolol tartrate (LOPRESSOR) 25 MG tablet Take 25 mg by mouth daily. 07/31/18   [provider]  nitroGLYCERIN (NITROSTAT) 0.4 MG SL tablet Place 1 tablet (0.4 mg total) under the tongue every 5 (five) minutes x 3 doses as needed for chest pain. 10/18/12   Theora Gianotti, NP  NON FORMULARY Diet Type: Regular,  NAS, Consistent Carbohydrate 07/31/18   [provider]  NON FORMULARY ACE Wraps to BLE d/t increased edema Special Instructions: On during the day off at [facility] Once A Day    [provider]  polyethylene glycol (MIRALAX / GLYCOLAX) 17 g packet Take 17 g by mouth daily. 08/21/18   [provider]  spironolactone (ALDACTONE) 25 MG tablet Take 25 mg by mouth daily.    [provider]  tamsulosin (FLOMAX) 0.4 MG CAPS capsule Take 0.4 mg by mouth every evening. 08/20/18   [provider]     Allergies:     Allergies  Allergen Reactions   Codeine Other (See Comments)     Dizziness, stomach pain     Physical Exam:   Vitals  Blood pressure 123/73, pulse 60, temperature 97.6 F (36.4 C), temperature source Oral, resp. rate 18, SpO2 100 %.  1.  General: axoxo3  2. Psychiatric: euthymic  3. Neurologic: cn2-12 intact, reflexes 2+ symmetric, diffuse with no clonus, motor 5/5 in the bilateral upper extremities, and 0/5 in the bilateral lower ext  4. HEENMT:  Anicteric, pupils 1.30m symmetric, direct, consensual intact Neck: no jvd  5. Respiratory : CTAB  6. Cardiovascular : rrr s1, s2, no m/g/r  7. Gastrointestinal:  Abd: soft, nt, nd, +bs  8. Skin:  Ext: no c/c,  2+ edema pitting of the bilateral distal lower ext  9.Musculoskeletal:  Good ROM    Data Review:    CBC Recent Labs  Lab 11/26/18 1840  WBC 8.6  HGB 14.6  HCT 42.6  PLT 219  MCV 85.2  MCH 29.2  MCHC 34.3  RDW 13.9  LYMPHSABS 0.5*  MONOABS 0.7  EOSABS 0.5  BASOSABS 0.0   ------------------------------------------------------------------------------------------------------------------  Results for orders placed or performed during the hospital encounter of 11/26/18 (from the past 48 hour(s))  Comprehensive metabolic panel     Status: Abnormal   Collection Time: 11/26/18  6:40 PM  Result Value Ref Range   Sodium 124 (L) 135 - 145 mmol/L   Potassium 4.0 3.5 - 5.1 mmol/L   Chloride 88 (L) 98 - 111 mmol/L   CO2 26 22 - 32 mmol/L   Glucose, Bld 145 (H) 70 - 99 mg/dL   BUN 14 8 - 23 mg/dL   Creatinine, Ser 0.72 0.61 - 1.24 mg/dL   Calcium 8.4 (L) 8.9 - 10.3 mg/dL   Total Protein 6.3 (L) 6.5 - 8.1 g/dL   Albumin 3.1 (L) 3.5 - 5.0 g/dL   AST 16 15 - 41 U/L   ALT 15 0 -  44 U/L   Alkaline Phosphatase 103 38 - 126 U/L   Total Bilirubin 1.0 0.3 - 1.2 mg/dL   GFR calc non Af Amer >60 >60 mL/min   GFR calc Af Amer >60 >60 mL/min   Anion gap 10 5 - 15    Comment: Performed at [facility] [address] [place], [place] [address]  CBC WITH DIFFERENTIAL      Status: Abnormal   Collection Time: 11/26/18  6:40 PM  Result Value Ref Range   WBC 8.6 4.0 - 10.5 K/uL   RBC 5.00 4.22 - 5.81 MIL/uL   Hemoglobin 14.6 13.0 - 17.0 g/dL   HCT 42.6 39.0 - 52.0 %   MCV 85.2 80.0 - 100.0 fL   MCH 29.2 26.0 - 34.0 pg   MCHC 34.3 30.0 - 36.0 g/dL   RDW 13.9 11.5 - 15.5 %   Platelets 219 150 - 400 K/uL   nRBC 0.0 0.0 - 0.2 %   Neutrophils Relative % 80 %   Neutro Abs 6.9 1.7 - 7.7 K/uL   Lymphocytes Relative 6 %   Lymphs Abs 0.5 (L) 0.7 - 4.0 K/uL   Monocytes Relative 8 %   Monocytes Absolute 0.7 0.1 - 1.0 K/uL   Eosinophils Relative 6 %   Eosinophils Absolute 0.5 0.0 - 0.5 K/uL   Basophils Relative 0 %   Basophils Absolute 0.0 0.0 - 0.1 K/uL   Immature Granulocytes 0 %   Abs Immature Granulocytes 0.03 0.00 - 0.07 K/uL    Comment: Performed at [facility] [address], [place],  [address]    Chemistries  Recent Labs  Lab 11/26/18 1840  NA 124*  K 4.0  CL 88*  CO2 26  GLUCOSE 145*  BUN 14  CREATININE 0.72  CALCIUM 8.4*  AST 16  ALT 15  ALKPHOS 103  BILITOT 1.0   ------------------------------------------------------------------------------------------------------------------  ------------------------------------------------------------------------------------------------------------------ GFR: Estimated Creatinine Clearance: 85 mL/min (by C-G formula based on SCr of 0.72 mg/dL). Liver Function Tests: Recent Labs  Lab 11/26/18 1840  AST 16  ALT 15  ALKPHOS 103  BILITOT 1.0  PROT 6.3*  ALBUMIN 3.1*   No results for input(s): LIPASE, AMYLASE in the last 168 hours. No results for input(s): AMMONIA in the last 168 hours. Coagulation Profile: No results for input(s): INR, PROTIME in the last 168 hours. Cardiac Enzymes: No results for input(s): CKTOTAL, CKMB, CKMBINDEX, TROPONINI in the last 168 hours. BNP (last 3 results) No results for input(s): PROBNP in the last 8760 hours. HbA1C: No results for input(s):  HGBA1C in the last 72 hours. CBG: No results for input(s): GLUCAP in the last 168 hours. Lipid Profile: No results for input(s): CHOL, HDL, LDLCALC, TRIG, CHOLHDL, LDLDIRECT in the last 72 hours. Thyroid Function Tests: No results for input(s): TSH, T4TOTAL, FREET4, T3FREE, THYROIDAB in the last 72 hours. Anemia Panel: No results for input(s): VITAMINB12, FOLATE, FERRITIN, TIBC, IRON, RETICCTPCT in the last 72 hours.  --------------------------------------------------------------------------------------------------------------- Urine analysis:    Component Value Date/Time   COLORURINE YELLOW 08/04/2018 [place] 08/04/2018 1338   LABSPEC 1.017 08/04/2018 1338   PHURINE 6.0 08/04/2018 1338   GLUCOSEU NEGATIVE 08/04/2018 1338   HGBUR SMALL (A) 08/04/2018 [place] 08/04/2018 [place] 08/04/2018 [place] 08/04/2018 1338   NITRITE NEGATIVE 08/04/2018 [place] 08/04/2018 1338      Imaging Results:    Ct Thoracic  Spine W Contrast  Result Date: 11/26/2018 CLINICAL DATA:  73 year old male with grade 2 lower lumbar spondylolisthesis status post posterior decompression of the lumbar spine in May this year. Evidence of solid ankylosis at the L4-L5 spondylolisthesis level, however worsening weakness and neurologic symptoms since the time of surgery. History of lung cancer with multiple recurrences. FLUOROSCOPY TIME:  1 minutes 12 seconds PROCEDURE: LUMBAR PUNCTURE FOR THORACIC AND LUMBAR MYELOGRAM Lumbar puncture and intrathecal contrast administration were performed by Dr. Kristeen Miss who will separately report for the portion of the procedure. I personally supervised acquisition of the myelogram images. COMPARISON:  Postoperative lumbar spine CT 09/26/2018. Preoperative CT 07/10/2018. chest CT 07/10/2018. TECHNIQUE: Contiguous axial images were obtained through the Thoracic and Lumbar spine after the  intrathecal infusion of infusion. Coronal and sagittal reconstructions were obtained of the axial image sets. FINDINGS: MYELOGRAM FINDINGS: Lumbar puncture was performed at the upper lumbar level. On the initial postcontrast images there is 5-6 millimeter round filling defect occupying the left half of the thecal sac at the L3 vertebral body level. This became more difficult to see over the course of the myelographic images and will be reported in the lumbar CT below. Stable spondylolisthesis of L4 on L5. Stable vertebral height and alignment elsewhere including mild retrolisthesis of L1 on L2. Good lumbar intrathecal sac opacification, with no lumbar spinal stenosis from L2 to the sacrum. There is a defect on the ventral thecal sac at L1-L2 related to chronic disc osteophyte complex, appearing similar to that expected based on the July CT. A 2nd lateral view in mild Trendelenburg demonstrates contrast flow beyond this level, and normal appearing thecal sac patency at T12-L1. See additional post myelogram CT findings of the thoracic spine below. CT THORACIC MYELOGRAM FINDINGS: Decision was made following the acquisition of plain lumbar myelogram images to obtain CT post myelogram images also of the thoracic spine. Thoracic spine images were then obtained in a delayed fashion at 1241 hours (versus CT acquisition at 1133 hours of the lumbar spine. Only trace myelographic contrast is present in the thoracic spine, and seems to be mostly subdural space contrast. However, on axial series 7 images 68 through 110 there does appear to be a small amount of intrathecal thoracic contrast delineating a nonenlarged thoracic spinal cord from the T7 through the T10 level. Additionally the conus from T11-T12 continues to have an unusual post myelographic appearance which seems unlikely just to the mixed contrast injection (over an hour delayed from the lumbar images). And furthermore there remains a nodular filling defect in the  right thecal sac at T12 (in addition to the multifocal cauda equina nodularity described below). There is no evidence of degenerative thoracic spinal stenosis. No acute or suspicious osseous lesion is identified in the thoracic spine. Persistent masslike opacity at the left hilum and in the visible posterior left upper lung pleural space. Partially visible persistent right upper lobe curvilinear confluent opacity on series 6, image 40. No new or increased mediastinal lymph nodes are identified. CT LUMBAR MYELOGRAM FINDINGS: Stable visible abdominal viscera including Calcified aortic atherosclerosis. And right adrenal region surgical clips. Stable posterior paraspinal soft tissues. No acute osseous abnormality identified. Stable posterior postoperative changes at L3-L4 and L4-L5. Solid interbody ankylosis redemonstrated at L4-L5. Intact visible sacrum and SI joints. T12-L1: The tip of the conus is at this level, above which there is only trace subdural intraspinal contrast. A filling defect in the right thecal sac on series 5, image 41 was initially felt probably  related to the mixed injection surrounding the exiting right T12 nerve - although that seems less likely given the persistence on the delayed thoracic CT described above. No spinal stenosis at this level, minor disc bulging and endplate spurring appear stable since July. L1-L2: Chronic mild retrolisthesis with disc space loss and circumferential disc osteophyte complex. Mild spinal stenosis and mild to moderate left lateral recess stenosis (series 5, image 57). Moderate bilateral L1 foraminal stenosis. This level appears stable since July. L2-L3: Circumferential disc bulge and mild to moderate posterior element hypertrophy. No spinal stenosis. Borderline to mild left lateral recess stenosis. Mild to moderate left and borderline to mild right L2 foraminal stenosis. L3: Posterior to the L3 vertebral body to the left of midline there is a small 4-5 millimeter  low-density filling defect associated with the cauda equina (series 5, image 80 and sagittal series 8, image 35), around which there is mildly hyperdense contrast, which appears similar to the epidural/subdural contrast also in this region (see bilateral exiting L3 nerve roots). Just cephalad of this lesion and near the midline there is a subtle 2 millimeter area of thickening of the cauda equina (series 5, image 76). L3-L4: Posterior decompression of this level. Circumferential disc osteophyte complex and residual posterior element hypertrophy with widely patent thecal sac. Borderline to mild bilateral L3 foraminal stenosis. L4-L5: Chronic grade 2 spondylolisthesis with interbody ankylosis and posterior decompression. There is mildly irregular thickening of multiple cauda equina nerve roots also at this level (series 5, image 95) most of which are not displaced outwardly along the walls of the thecal sac. No spinal stenosis. No lateral recess stenosis. Moderate to severe bilateral L4 foraminal stenosis which is probably greater on the right. L5-S1:  Mild disc bulge and endplate spurring without stenosis. IMPRESSION: 1. 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 Mild chronic appearing lumbar spinal stenosis at L1-L2 is likely stable since that time, with decompressed L3-L4 and L4-L5 levels and no other significant lumbar spinal stenosis. 2. Non-specific thickening and nodularity of the cauda equina at the T12, L3, and L4 levels, with a superimposed unusual and bulbous appearance of the conus medullaris, which though initially felt related to a mixed contrast injection at the time of lumbar CT images (1133 hours) remained unchanged on delayed thoracic spine images at 1241 hours. And with no interval cephalad migration of intrathecal contrast, despite absent CT evidence of any thoracic spinal stenosis. 3. This constellation of clinical and  imaging findings therefore raises the possibility of intrathecal metastatic disease with involvement of the conus and cauda equina. These studies were discussed by telephone with Dr. Kristeen Miss on 11/26/2018 at 13:20. Electronically Signed   By: Genevie Ann M.D.   On: 11/26/2018 13:25   Ct Lumbar Spine W Contrast  Result Date: 11/26/2018 CLINICAL DATA:  73 year old male with grade 2 lower lumbar spondylolisthesis status post posterior decompression of the lumbar spine in May this year. Evidence of solid ankylosis at the L4-L5 spondylolisthesis level, however worsening weakness and neurologic symptoms since the time of surgery. History of lung cancer with multiple recurrences. FLUOROSCOPY TIME:  1 minutes 12 seconds PROCEDURE: LUMBAR PUNCTURE FOR THORACIC AND LUMBAR MYELOGRAM Lumbar puncture and intrathecal contrast administration were performed by Dr. Kristeen Miss who will separately report for the portion of the procedure. I personally supervised acquisition of the myelogram images. COMPARISON:  Postoperative lumbar spine CT 09/26/2018. Preoperative CT 07/10/2018. chest CT 07/10/2018. TECHNIQUE: Contiguous axial  images were obtained through the Thoracic and Lumbar spine after the intrathecal infusion of infusion. Coronal and sagittal reconstructions were obtained of the axial image sets. FINDINGS: MYELOGRAM FINDINGS: Lumbar puncture was performed at the upper lumbar level. On the initial postcontrast images there is 5-6 millimeter round filling defect occupying the left half of the thecal sac at the L3 vertebral body level. This became more difficult to see over the course of the myelographic images and will be reported in the lumbar CT below. Stable spondylolisthesis of L4 on L5. Stable vertebral height and alignment elsewhere including mild retrolisthesis of L1 on L2. Good lumbar intrathecal sac opacification, with no lumbar spinal stenosis from L2 to the sacrum. There is a defect on the ventral thecal sac at  L1-L2 related to chronic disc osteophyte complex, appearing similar to that expected based on the July CT. A 2nd lateral view in mild Trendelenburg demonstrates contrast flow beyond this level, and normal appearing thecal sac patency at T12-L1. See additional post myelogram CT findings of the thoracic spine below. CT THORACIC MYELOGRAM FINDINGS: Decision was made following the acquisition of plain lumbar myelogram images to obtain CT post myelogram images also of the thoracic spine. Thoracic spine images were then obtained in a delayed fashion at 1241 hours (versus CT acquisition at 1133 hours of the lumbar spine. Only trace myelographic contrast is present in the thoracic spine, and seems to be mostly subdural space contrast. However, on axial series 7 images 68 through 110 there does appear to be a small amount of intrathecal thoracic contrast delineating a nonenlarged thoracic spinal cord from the T7 through the T10 level. Additionally the conus from T11-T12 continues to have an unusual post myelographic appearance which seems unlikely just to the mixed contrast injection (over an hour delayed from the lumbar images). And furthermore there remains a nodular filling defect in the right thecal sac at T12 (in addition to the multifocal cauda equina nodularity described below). There is no evidence of degenerative thoracic spinal stenosis. No acute or suspicious osseous lesion is identified in the thoracic spine. Persistent masslike opacity at the left hilum and in the visible posterior left upper lung pleural space. Partially visible persistent right upper lobe curvilinear confluent opacity on series 6, image 40. No new or increased mediastinal lymph nodes are identified. CT LUMBAR MYELOGRAM FINDINGS: Stable visible abdominal viscera including Calcified aortic atherosclerosis. And right adrenal region surgical clips. Stable posterior paraspinal soft tissues. No acute osseous abnormality identified. Stable posterior  postoperative changes at L3-L4 and L4-L5. Solid interbody ankylosis redemonstrated at L4-L5. Intact visible sacrum and SI joints. T12-L1: The tip of the conus is at this level, above which there is only trace subdural intraspinal contrast. A filling defect in the right thecal sac on series 5, image 41 was initially felt probably related to the mixed injection surrounding the exiting right T12 nerve - although that seems less likely given the persistence on the delayed thoracic CT described above. No spinal stenosis at this level, minor disc bulging and endplate spurring appear stable since July. L1-L2: Chronic mild retrolisthesis with disc space loss and circumferential disc osteophyte complex. Mild spinal stenosis and mild to moderate left lateral recess stenosis (series 5, image 57). Moderate bilateral L1 foraminal stenosis. This level appears stable since July. L2-L3: Circumferential disc bulge and mild to moderate posterior element hypertrophy. No spinal stenosis. Borderline to mild left lateral recess stenosis. Mild to moderate left and borderline to mild right L2 foraminal stenosis. L3: Posterior to  the L3 vertebral body to the left of midline there is a small 4-5 millimeter low-density filling defect associated with the cauda equina (series 5, image 80 and sagittal series 8, image 35), around which there is mildly hyperdense contrast, which appears similar to the epidural/subdural contrast also in this region (see bilateral exiting L3 nerve roots). Just cephalad of this lesion and near the midline there is a subtle 2 millimeter area of thickening of the cauda equina (series 5, image 76). L3-L4: Posterior decompression of this level. Circumferential disc osteophyte complex and residual posterior element hypertrophy with widely patent thecal sac. Borderline to mild bilateral L3 foraminal stenosis. L4-L5: Chronic grade 2 spondylolisthesis with interbody ankylosis and posterior decompression. There is mildly  irregular thickening of multiple cauda equina nerve roots also at this level (series 5, image 95) most of which are not displaced outwardly along the walls of the thecal sac. No spinal stenosis. No lateral recess stenosis. Moderate to severe bilateral L4 foraminal stenosis which is probably greater on the right. L5-S1:  Mild disc bulge and endplate spurring without stenosis. IMPRESSION: 1. 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 Mild chronic appearing lumbar spinal stenosis at L1-L2 is likely stable since that time, with decompressed L3-L4 and L4-L5 levels and no other significant lumbar spinal stenosis. 2. Non-specific thickening and nodularity of the cauda equina at the T12, L3, and L4 levels, with a superimposed unusual and bulbous appearance of the conus medullaris, which though initially felt related to a mixed contrast injection at the time of lumbar CT images (1133 hours) remained unchanged on delayed thoracic spine images at 1241 hours. And with no interval cephalad migration of intrathecal contrast, despite absent CT evidence of any thoracic spinal stenosis. 3. This constellation of clinical and imaging findings therefore raises the possibility of intrathecal metastatic disease with involvement of the conus and cauda equina. These studies were discussed by telephone with Dr. Kristeen Miss on 11/26/2018 at 13:20. Electronically Signed   By: Genevie Ann M.D.   On: 11/26/2018 13:25   Dg Myelogram Lumbar  Result Date: 11/26/2018 CLINICAL DATA:  73 year old male with grade 2 lower lumbar spondylolisthesis status post posterior decompression of the lumbar spine in May this year. Evidence of solid ankylosis at the L4-L5 spondylolisthesis level, however worsening weakness and neurologic symptoms since the time of surgery. History of lung cancer with multiple recurrences. FLUOROSCOPY TIME:  1 minutes 12 seconds PROCEDURE: LUMBAR PUNCTURE  FOR THORACIC AND LUMBAR MYELOGRAM Lumbar puncture and intrathecal contrast administration were performed by Dr. Kristeen Miss who will separately report for the portion of the procedure. I personally supervised acquisition of the myelogram images. COMPARISON:  Postoperative lumbar spine CT 09/26/2018. Preoperative CT 07/10/2018. chest CT 07/10/2018. TECHNIQUE: Contiguous axial images were obtained through the Thoracic and Lumbar spine after the intrathecal infusion of infusion. Coronal and sagittal reconstructions were obtained of the axial image sets. FINDINGS: MYELOGRAM FINDINGS: Lumbar puncture was performed at the upper lumbar level. On the initial postcontrast images there is 5-6 millimeter round filling defect occupying the left half of the thecal sac at the L3 vertebral body level. This became more difficult to see over the course of the myelographic images and will be reported in the lumbar CT below. Stable spondylolisthesis of L4 on L5. Stable vertebral height and alignment elsewhere including mild retrolisthesis of L1 on L2. Good lumbar intrathecal sac opacification, with no lumbar spinal stenosis from L2 to the sacrum.  There is a defect on the ventral thecal sac at L1-L2 related to chronic disc osteophyte complex, appearing similar to that expected based on the July CT. A 2nd lateral view in mild Trendelenburg demonstrates contrast flow beyond this level, and normal appearing thecal sac patency at T12-L1. See additional post myelogram CT findings of the thoracic spine below. CT THORACIC MYELOGRAM FINDINGS: Decision was made following the acquisition of plain lumbar myelogram images to obtain CT post myelogram images also of the thoracic spine. Thoracic spine images were then obtained in a delayed fashion at 1241 hours (versus CT acquisition at 1133 hours of the lumbar spine. Only trace myelographic contrast is present in the thoracic spine, and seems to be mostly subdural space contrast. However, on axial  series 7 images 68 through 110 there does appear to be a small amount of intrathecal thoracic contrast delineating a nonenlarged thoracic spinal cord from the T7 through the T10 level. Additionally the conus from T11-T12 continues to have an unusual post myelographic appearance which seems unlikely just to the mixed contrast injection (over an hour delayed from the lumbar images). And furthermore there remains a nodular filling defect in the right thecal sac at T12 (in addition to the multifocal cauda equina nodularity described below). There is no evidence of degenerative thoracic spinal stenosis. No acute or suspicious osseous lesion is identified in the thoracic spine. Persistent masslike opacity at the left hilum and in the visible posterior left upper lung pleural space. Partially visible persistent right upper lobe curvilinear confluent opacity on series 6, image 40. No new or increased mediastinal lymph nodes are identified. CT LUMBAR MYELOGRAM FINDINGS: Stable visible abdominal viscera including Calcified aortic atherosclerosis. And right adrenal region surgical clips. Stable posterior paraspinal soft tissues. No acute osseous abnormality identified. Stable posterior postoperative changes at L3-L4 and L4-L5. Solid interbody ankylosis redemonstrated at L4-L5. Intact visible sacrum and SI joints. T12-L1: The tip of the conus is at this level, above which there is only trace subdural intraspinal contrast. A filling defect in the right thecal sac on series 5, image 41 was initially felt probably related to the mixed injection surrounding the exiting right T12 nerve - although that seems less likely given the persistence on the delayed thoracic CT described above. No spinal stenosis at this level, minor disc bulging and endplate spurring appear stable since July. L1-L2: Chronic mild retrolisthesis with disc space loss and circumferential disc osteophyte complex. Mild spinal stenosis and mild to moderate left  lateral recess stenosis (series 5, image 57). Moderate bilateral L1 foraminal stenosis. This level appears stable since July. L2-L3: Circumferential disc bulge and mild to moderate posterior element hypertrophy. No spinal stenosis. Borderline to mild left lateral recess stenosis. Mild to moderate left and borderline to mild right L2 foraminal stenosis. L3: Posterior to the L3 vertebral body to the left of midline there is a small 4-5 millimeter low-density filling defect associated with the cauda equina (series 5, image 80 and sagittal series 8, image 35), around which there is mildly hyperdense contrast, which appears similar to the epidural/subdural contrast also in this region (see bilateral exiting L3 nerve roots). Just cephalad of this lesion and near the midline there is a subtle 2 millimeter area of thickening of the cauda equina (series 5, image 76). L3-L4: Posterior decompression of this level. Circumferential disc osteophyte complex and residual posterior element hypertrophy with widely patent thecal sac. Borderline to mild bilateral L3 foraminal stenosis. L4-L5: Chronic grade 2 spondylolisthesis with interbody ankylosis and posterior  decompression. There is mildly irregular thickening of multiple cauda equina nerve roots also at this level (series 5, image 95) most of which are not displaced outwardly along the walls of the thecal sac. No spinal stenosis. No lateral recess stenosis. Moderate to severe bilateral L4 foraminal stenosis which is probably greater on the right. L5-S1:  Mild disc bulge and endplate spurring without stenosis. IMPRESSION: 1. 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 Mild chronic appearing lumbar spinal stenosis at L1-L2 is likely stable since that time, with decompressed L3-L4 and L4-L5 levels and no other significant lumbar spinal stenosis. 2. Non-specific thickening and nodularity of the cauda equina  at the T12, L3, and L4 levels, with a superimposed unusual and bulbous appearance of the conus medullaris, which though initially felt related to a mixed contrast injection at the time of lumbar CT images (1133 hours) remained unchanged on delayed thoracic spine images at 1241 hours. And with no interval cephalad migration of intrathecal contrast, despite absent CT evidence of any thoracic spinal stenosis. 3. This constellation of clinical and imaging findings therefore raises the possibility of intrathecal metastatic disease with involvement of the conus and cauda equina. These studies were discussed by telephone with Dr. Kristeen Miss on 11/26/2018 at 13:20. Electronically Signed   By: Genevie Ann M.D.   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 Cauda equina ? Metastatic disease to spine ? Paraneoplastic syndrome, ? GBS, ?Myasthenia/ Eaton-Lambert Check tsh, esr, cpk, ana, acetylcholine ab, consider check paraneoplastic antibodies Consider MRI brain w/ w/o contrast Consider NCT/EMG Consider LP Neurology consulted to help w/up  Hyponatremia Check serum osm, tsh, cortisol Check urine sodium, urine osm Hydrate with ns at 90m per hour x 5 hours Check cmp in am  Pafib Eliquis stopped by neurosurgery Defer to cardiology and neurosurgery on restart  CHB s/p pacer Cardiology consulted per neurosurgery to see if can obtain MRI  H/o CADs/p DES Lcx,  CHF (EF 55%) Cont Lipitor 277mpo qhs Cont Metoprolol 2560mo qday Cont Spironolactone 5m47m qday DC Lasix, Start Lasix 20mg20mqday, (oral lasix was not working probably due to low albumin) Try Torsemide 20mg 15mday or Bumex 1mg [id number] to see if works  better when converting to oral medication  LE edema STOP Gabapentin as can cause LE edema LE ultrasound r/o DVT Torsemide as above  Anxiety Cont Clonazepam   H/o Lung cancer Cont Duoneb bid Consider oncology consultation  Moderate protein calorie malnutrition prostat 30 mL po bid  Medicine to take over as primary though patient admitted by neurosurgery , [place] amenable  DVT Prophylaxis-    SCDs   AM Labs Ordered, also please review Full Orders  Family Communication: Admission, patients condition and plan of care including tests being ordered have been discussed with the patient  who indicate understanding and agree with the plan and Code Status.  Code Status:  Full Code per patient, notified brother that patient admitted to [facility]  [facility]: Based on patients clinical presentation and evaluation of above clinical data, I have made determination that patient meets Inpatient criteria at  this time. Pt unable to move his legs, may have cauda equina syndrome.  Pt may require iv steroids,  He will require iv ns to improve his hyponatremia Na=124,  Pt will require further investigation of inability to move legs as well as treatment.  Pt has high risk of clinical deterioration,  Will require > 2 nites stay.   Time spent in minutes :  70 minutes   Jani Gravel M.D on 11/26/2018 at 11:32 PM

## 2018-11-26 NOTE — Progress Notes (Addendum)
Received pt from Radiology (Myelogram) to stay with [place] until a bed is available- for admission.

## 2018-11-26 NOTE — Progress Notes (Signed)
Dr Ellene Route called and asked if pt can go to any med surg bed since [place] was requested. States yes any med surg is ok.

## 2018-11-26 NOTE — Consult Note (Signed)
NEURO HOSPITALIST CONSULT NOTE   Requestig physician: Dr. Ellene Route  Reason for Consult: BLE paralysis with sensory loss since May  History obtained from:   Patient and Chart    HPI:                                                                                                                                          Benjamin [name] is an 73 y.o. male with a history of non-small cell lung cancer and PAF on Eliquis at home (currently stopped this admission), presenting to [facility] for further evaluation of BLE flaccid paralysis with sensory loss since May. Symptoms began in April with rapidly progressive BLE weakness and sensory changes as well as B/B incontinence. The symptoms rapidly progressed over a period of about 5 weeks. He was evaluated by Neurosurgery (Dr. Ellene Route) and lumbar decompressive surgery was undertaken. The patient had no improvement after surgery. Per patient, he was told that given lack of improvement with surgery, the DDx has now shifted to possible metastatic CA involving the spine. He has been unable to obtain MRI so far due to pacemaker which was placed by Dr. Lovena Le approximately 3 years ago at the [facility] heart center. Regarding his cancer history, he was diagnosed with lung CA in 2003, has had surgeries for resection and recurrences, as well as XRT courses at 3 separate time points spanning from 2009 to 2019.   He states that he cannot move his BLE except for slight movement at knees and ankles. He has gradually lost BLE muscle mass since May of this year. His arms are asymptomatic. He cannot feel when he has a bowel movement or urinates. He also cannot feel anything when foley catheter is placed. He resides at a SNF, is unable to ambulate and requires max assistance for transfer from bed to wheelchair.   A CT myelogram was performed this admission, revealing the following: 1. 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 Mild chronic appearing lumbar spinal stenosis at L1-L2 is likely stable since that time, with decompressed L3-L4 and L4-L5 levels and no other significant lumbar spinal stenosis.  2. Non-specific thickening and nodularity of the cauda equina at the T12, L3, and L4 levels, with a superimposed unusual and bulbous appearance of the conus medullaris, which though initially felt related to a mixed contrast injection at the time of lumbar CT images (1133 hours) remained unchanged on delayed thoracic spine images at 1241 hours. And with no interval cephalad migration of intrathecal contrast, despite absent CT evidence of any thoracic spinal stenosis. 3. This constellation of clinical and imaging findings therefore raises the possibility of intrathecal metastatic disease with involvement of the conus and cauda equina  Past  Medical History:  Diagnosis Date  . Adenopathy    RIGHT ADRENAL  . Anxiety   . Arthritis    KNEES/HANDS  . CAD (coronary artery disease)    a. Prior known CTO of LCX with recanalizationby cath 2008;  b. 09/2012 NSTEMI/Cath/PCI: LM nl, LAD 45m, D1 50p, LCX 100p (2.75x16 Promus Premier DES), 80-29m, OM1 30, RCA 42m (3.0x16 Promus Premier DES), EF 50-55%.  [name] History of echocardiogram    Echo 2/18: Moderate LVH, EF 55-60, normal wall motion, trivial AI, moderate LAE, mild RVE, mild RAE, mildly elevated pulmonary pressure  . Hx of melanoma of skin   . 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 Malignant neoplasm of hilus of lung (HCC)    NON-SMALL-CELL    . Non-small cell lung cancer ([place])    a. s/p resection and XRT.  [name] PAF (paroxysmal atrial fibrillation) ([place])    a. not currently on anticoagulation (09/2012)  . Presence of permanent cardiac pacemaker   . 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 Uses walker     Past Surgical History:  Procedure Laterality Date  . ADRENALEXTOMY  02/2000  . CARDIAC CATHETERIZATION   2008   LAD 40, D1 OK, D2 50, CFX 90, RCA OK, EF 60%; Consider PCI PRN but the stenosis is quite complex and we would end up jailing several large posterolateral branches    . HERNIA REPAIR  05/27/2018  . INGUINAL HERNIA REPAIR Right 05/27/2018   Procedure: INCARCERATED RIGHT INGUINAL HERNIA REPAIR;  Surgeon: Erroll Luna, MD;  Location: [place];  Service: General;  Laterality: Right;  . LEFT HEART CATHETERIZATION WITH CORONARY ANGIOGRAM N/A 10/17/2012   Procedure: LEFT HEART CATHETERIZATION WITH CORONARY ANGIOGRAM;  Surgeon: Sherren Mocha, MD;  Location: [facility] CATH LAB;  Service: Cardiovascular;  Laterality: N/A;  . LOBECTOMY LEFT UPPER LOBE  08/15/2001  . LUMBAR LAMINECTOMY/DECOMPRESSION MICRODISCECTOMY N/A 07/25/2018   Procedure: Lumbar Four-Five Laminectomy;  Surgeon: Kristeen Miss, MD;  Location: [place];  Service: Neurosurgery;  Laterality: N/A;  Lumbar Four-Five Laminectomy  . PACEMAKER IMPLANT N/A 08/29/2016   Procedure: Pacemaker Implant;  Surgeon: Evans Lance, MD;  Location: [place] CV LAB;  Service: Cardiovascular;  Laterality: N/A;  . Right VATS, mini thoracotomy, wedge resection of right upper  01/18/2004    Family History  Problem Relation Age of Onset  . Heart attack Father   . Cancer Mother        lung  . Cancer Cousin        breast              Social History:  reports that he quit smoking about 17 years ago. His smoking use included cigarettes. He has a 40.00 pack-year smoking history. He has never used smokeless tobacco. He reports that he does not drink alcohol or use drugs.  Allergies  Allergen Reactions  . Codeine Other (See Comments)    Dizziness, stomach pain    MEDICATIONS:  Prior to Admission:  Medications Prior to Admission  Medication Sig Dispense Refill Last Dose  . acetaminophen (TYLENOL) 325 MG tablet Take 2 tablets (650 mg total) by mouth  every 6 (six) hours as needed for mild pain (or temp > 100).     [name] apixaban (ELIQUIS) 5 MG TABS tablet Take 5 mg by mouth 2 (two) times daily.     [name] atorvastatin (LIPITOR) 20 MG tablet Take 20 mg by mouth daily.      Roseanne Kaufman Peru-Castor Oil (VENELEX) OINT Apply topically to sacrum every shift until resolved     . clonazePAM (KLONOPIN) 1 MG tablet Take 1 tablet (1 mg total) by mouth 3 (three) times daily. 90 tablet 0   . furosemide (LASIX) 40 MG tablet Take 40 mg by mouth daily.     [name] gabapentin (NEURONTIN) 100 MG capsule Take 100 mg by mouth at bedtime.     [name] ipratropium-albuterol (DUONEB) 0.5-2.5 (3) MG/3ML SOLN Take 3 mLs by nebulization 2 (two) times daily.     . magnesium hydroxide (MILK OF MAGNESIA) 400 MG/5ML suspension Take 30 mLs by mouth daily as needed for mild constipation.     . metoprolol tartrate (LOPRESSOR) 25 MG tablet Take 25 mg by mouth daily.     . nitroGLYCERIN (NITROSTAT) 0.4 MG SL tablet Place 1 tablet (0.4 mg total) under the tongue every 5 (five) minutes x 3 doses as needed for chest pain. 25 tablet 3   . NON FORMULARY Diet Type: Regular,  NAS, Consistent Carbohydrate     . NON FORMULARY ACE Wraps to BLE d/t increased edema Special Instructions: On during the day off at [facility] Once A Day     . polyethylene glycol (MIRALAX / GLYCOLAX) 17 g packet Take 17 g by mouth daily.     [name] spironolactone (ALDACTONE) 25 MG tablet Take 25 mg by mouth daily.     . tamsulosin (FLOMAX) 0.4 MG CAPS capsule Take 0.4 mg by mouth every evening.      Scheduled: . atorvastatin  20 mg Oral Daily  . clonazePAM  1 mg Oral TID  . enoxaparin (LOVENOX) injection  40 mg Subcutaneous Q24H  . feeding supplement (PRO-STAT SUGAR FREE 64)  30 mL Oral BID  . furosemide  20 mg Intravenous Daily  . gabapentin  100 mg Oral QHS  . ipratropium-albuterol  3 mL Nebulization BID  . metoprolol tartrate  25 mg Oral Daily  . spironolactone  25 mg Oral Daily  . tamsulosin  0.4 mg Oral QPM      ROS:                                                                                                                                        Complains of diffuse fatigue and feeling sedated. Other symptoms as per HPI with comprehensive ROS otherwise negative.    Blood pressure 123/73, pulse  60, temperature 97.6 F (36.4 C), temperature source Oral, resp. rate 18, SpO2 100 %.   General Examination:                                                                                                       Physical Exam  HEENT-  [place]/AT    Lungs- Respirations unlabored Extremities- Edematous BLE, worse distally   Neurological Examination Mental Status: Alert, oriented, thought content appropriate.  Speech fluent without evidence of aphasia.  Able to follow all commands without difficulty. Cranial Nerves: II: Visual fields intact with no extinction to DSS. Fixates and tracks normally. PERRL.   III,IV, VI: No ptosis. EOMI without nystagmus.  V,VII: smile symmetric, facial temp sensation equal bilaterally VIII: hearing intact to conversation IX,X: Phonation intact.  XI: Symmetric XII: midline tongue extension Motor: BUE 4+/5 without asymmetry BLE with 0/5 hip flexion, minimal movement in flexion and extension at knees (1-2/5), minimal movement at ankles (1-2/5) and unable to wiggle toes. Tone is flaccid in BLE. There is significantly decreased muscle bulk of BLE, exhibiting a gradient that is worse distally. Sensory: Absent temp, pressure and FT sensation to BLE from mid-thigh distally. Slight preserved FT sensation to proximal thighs, mild loss of FT sensation to hips, normal sensation above approximately the T12 level.  FT and temp sensation intact to BUE.  Deep Tendon Reflexes: 2+ bilateral brachioradialis and biceps. Absent patellar and achilles reflexes bilaterally Plantars: Mute bilaterally  Cerebellar: No ataxia with FNF bilaterally. Unable to perform H-S.  Gait: Unable to assess.    Lab Results: Basic  Metabolic Panel: Recent Labs  Lab 11/26/18 1840  NA 124*  K 4.0  CL 88*  CO2 26  GLUCOSE 145*  BUN 14  CREATININE 0.72  CALCIUM 8.4*    CBC: Recent Labs  Lab 11/26/18 1840  WBC 8.6  NEUTROABS 6.9  HGB 14.6  HCT 42.6  MCV 85.2  PLT 219    Cardiac Enzymes: No results for input(s): CKTOTAL, CKMB, CKMBINDEX, TROPONINI in the last 168 hours.  Lipid Panel: No results for input(s): CHOL, TRIG, HDL, CHOLHDL, VLDL, LDLCALC in the last 168 hours.  Imaging: Ct Thoracic Spine W Contrast  Result Date: 11/26/2018 CLINICAL DATA:  73 year old male with grade 2 lower lumbar spondylolisthesis status post posterior decompression of the lumbar spine in May this year. Evidence of solid ankylosis at the L4-L5 spondylolisthesis level, however worsening weakness and neurologic symptoms since the time of surgery. History of lung cancer with multiple recurrences. FLUOROSCOPY TIME:  1 minutes 12 seconds PROCEDURE: LUMBAR PUNCTURE FOR THORACIC AND LUMBAR MYELOGRAM Lumbar puncture and intrathecal contrast administration were performed by Dr. Kristeen Miss who will separately report for the portion of the procedure. I personally supervised acquisition of the myelogram images. COMPARISON:  Postoperative lumbar spine CT 09/26/2018. Preoperative CT 07/10/2018. chest CT 07/10/2018. TECHNIQUE: Contiguous axial images were obtained through the Thoracic and Lumbar spine after the intrathecal infusion of infusion. Coronal and sagittal reconstructions were obtained of the axial image sets. FINDINGS: MYELOGRAM FINDINGS: Lumbar puncture was performed at the upper lumbar level.  On the initial postcontrast images there is 5-6 millimeter round filling defect occupying the left half of the thecal sac at the L3 vertebral body level. This became more difficult to see over the course of the myelographic images and will be reported in the lumbar CT below. Stable spondylolisthesis of L4 on L5. Stable vertebral height and alignment  elsewhere including mild retrolisthesis of L1 on L2. Good lumbar intrathecal sac opacification, with no lumbar spinal stenosis from L2 to the sacrum. There is a defect on the ventral thecal sac at L1-L2 related to chronic disc osteophyte complex, appearing similar to that expected based on the July CT. A 2nd lateral view in mild Trendelenburg demonstrates contrast flow beyond this level, and normal appearing thecal sac patency at T12-L1. See additional post myelogram CT findings of the thoracic spine below. CT THORACIC MYELOGRAM FINDINGS: Decision was made following the acquisition of plain lumbar myelogram images to obtain CT post myelogram images also of the thoracic spine. Thoracic spine images were then obtained in a delayed fashion at 1241 hours (versus CT acquisition at 1133 hours of the lumbar spine. Only trace myelographic contrast is present in the thoracic spine, and seems to be mostly subdural space contrast. However, on axial series 7 images 68 through 110 there does appear to be a small amount of intrathecal thoracic contrast delineating a nonenlarged thoracic spinal cord from the T7 through the T10 level. Additionally the conus from T11-T12 continues to have an unusual post myelographic appearance which seems unlikely just to the mixed contrast injection (over an hour delayed from the lumbar images). And furthermore there remains a nodular filling defect in the right thecal sac at T12 (in addition to the multifocal cauda equina nodularity described below). There is no evidence of degenerative thoracic spinal stenosis. No acute or suspicious osseous lesion is identified in the thoracic spine. Persistent masslike opacity at the left hilum and in the visible posterior left upper lung pleural space. Partially visible persistent right upper lobe curvilinear confluent opacity on series 6, image 40. No new or increased mediastinal lymph nodes are identified. CT LUMBAR MYELOGRAM FINDINGS: Stable visible  abdominal viscera including Calcified aortic atherosclerosis. And right adrenal region surgical clips. Stable posterior paraspinal soft tissues. No acute osseous abnormality identified. Stable posterior postoperative changes at L3-L4 and L4-L5. Solid interbody ankylosis redemonstrated at L4-L5. Intact visible sacrum and SI joints. T12-L1: The tip of the conus is at this level, above which there is only trace subdural intraspinal contrast. A filling defect in the right thecal sac on series 5, image 41 was initially felt probably related to the mixed injection surrounding the exiting right T12 nerve - although that seems less likely given the persistence on the delayed thoracic CT described above. No spinal stenosis at this level, minor disc bulging and endplate spurring appear stable since July. L1-L2: Chronic mild retrolisthesis with disc space loss and circumferential disc osteophyte complex. Mild spinal stenosis and mild to moderate left lateral recess stenosis (series 5, image 57). Moderate bilateral L1 foraminal stenosis. This level appears stable since July. L2-L3: Circumferential disc bulge and mild to moderate posterior element hypertrophy. No spinal stenosis. Borderline to mild left lateral recess stenosis. Mild to moderate left and borderline to mild right L2 foraminal stenosis. L3: Posterior to the L3 vertebral body to the left of midline there is a small 4-5 millimeter low-density filling defect associated with the cauda equina (series 5, image 80 and sagittal series 8, image 35), around which there is mildly  hyperdense contrast, which appears similar to the epidural/subdural contrast also in this region (see bilateral exiting L3 nerve roots). Just cephalad of this lesion and near the midline there is a subtle 2 millimeter area of thickening of the cauda equina (series 5, image 76). L3-L4: Posterior decompression of this level. Circumferential disc osteophyte complex and residual posterior element  hypertrophy with widely patent thecal sac. Borderline to mild bilateral L3 foraminal stenosis. L4-L5: Chronic grade 2 spondylolisthesis with interbody ankylosis and posterior decompression. There is mildly irregular thickening of multiple cauda equina nerve roots also at this level (series 5, image 95) most of which are not displaced outwardly along the walls of the thecal sac. No spinal stenosis. No lateral recess stenosis. Moderate to severe bilateral L4 foraminal stenosis which is probably greater on the right. L5-S1:  Mild disc bulge and endplate spurring without stenosis. IMPRESSION: 1. 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 Mild chronic appearing lumbar spinal stenosis at L1-L2 is likely stable since that time, with decompressed L3-L4 and L4-L5 levels and no other significant lumbar spinal stenosis. 2. Non-specific thickening and nodularity of the cauda equina at the T12, L3, and L4 levels, with a superimposed unusual and bulbous appearance of the conus medullaris, which though initially felt related to a mixed contrast injection at the time of lumbar CT images (1133 hours) remained unchanged on delayed thoracic spine images at 1241 hours. And with no interval cephalad migration of intrathecal contrast, despite absent CT evidence of any thoracic spinal stenosis. 3. This constellation of clinical and imaging findings therefore raises the possibility of intrathecal metastatic disease with involvement of the conus and cauda equina. These studies were discussed by telephone with Dr. Kristeen Miss on 11/26/2018 at 13:20. Electronically Signed   By: Genevie Ann M.D.   On: 11/26/2018 13:25   Ct Lumbar Spine W Contrast  Result Date: 11/26/2018 CLINICAL DATA:  73 year old male with grade 2 lower lumbar spondylolisthesis status post posterior decompression of the lumbar spine in May this year. Evidence of solid ankylosis at the L4-L5  spondylolisthesis level, however worsening weakness and neurologic symptoms since the time of surgery. History of lung cancer with multiple recurrences. FLUOROSCOPY TIME:  1 minutes 12 seconds PROCEDURE: LUMBAR PUNCTURE FOR THORACIC AND LUMBAR MYELOGRAM Lumbar puncture and intrathecal contrast administration were performed by Dr. Kristeen Miss who will separately report for the portion of the procedure. I personally supervised acquisition of the myelogram images. COMPARISON:  Postoperative lumbar spine CT 09/26/2018. Preoperative CT 07/10/2018. chest CT 07/10/2018. TECHNIQUE: Contiguous axial images were obtained through the Thoracic and Lumbar spine after the intrathecal infusion of infusion. Coronal and sagittal reconstructions were obtained of the axial image sets. FINDINGS: MYELOGRAM FINDINGS: Lumbar puncture was performed at the upper lumbar level. On the initial postcontrast images there is 5-6 millimeter round filling defect occupying the left half of the thecal sac at the L3 vertebral body level. This became more difficult to see over the course of the myelographic images and will be reported in the lumbar CT below. Stable spondylolisthesis of L4 on L5. Stable vertebral height and alignment elsewhere including mild retrolisthesis of L1 on L2. Good lumbar intrathecal sac opacification, with no lumbar spinal stenosis from L2 to the sacrum. There is a defect on the ventral thecal sac at L1-L2 related to chronic disc osteophyte complex, appearing similar to that expected based on the July CT. A 2nd lateral view in mild Trendelenburg demonstrates contrast  flow beyond this level, and normal appearing thecal sac patency at T12-L1. See additional post myelogram CT findings of the thoracic spine below. CT THORACIC MYELOGRAM FINDINGS: Decision was made following the acquisition of plain lumbar myelogram images to obtain CT post myelogram images also of the thoracic spine. Thoracic spine images were then obtained in a  delayed fashion at 1241 hours (versus CT acquisition at 1133 hours of the lumbar spine. Only trace myelographic contrast is present in the thoracic spine, and seems to be mostly subdural space contrast. However, on axial series 7 images 68 through 110 there does appear to be a small amount of intrathecal thoracic contrast delineating a nonenlarged thoracic spinal cord from the T7 through the T10 level. Additionally the conus from T11-T12 continues to have an unusual post myelographic appearance which seems unlikely just to the mixed contrast injection (over an hour delayed from the lumbar images). And furthermore there remains a nodular filling defect in the right thecal sac at T12 (in addition to the multifocal cauda equina nodularity described below). There is no evidence of degenerative thoracic spinal stenosis. No acute or suspicious osseous lesion is identified in the thoracic spine. Persistent masslike opacity at the left hilum and in the visible posterior left upper lung pleural space. Partially visible persistent right upper lobe curvilinear confluent opacity on series 6, image 40. No new or increased mediastinal lymph nodes are identified. CT LUMBAR MYELOGRAM FINDINGS: Stable visible abdominal viscera including Calcified aortic atherosclerosis. And right adrenal region surgical clips. Stable posterior paraspinal soft tissues. No acute osseous abnormality identified. Stable posterior postoperative changes at L3-L4 and L4-L5. Solid interbody ankylosis redemonstrated at L4-L5. Intact visible sacrum and SI joints. T12-L1: The tip of the conus is at this level, above which there is only trace subdural intraspinal contrast. A filling defect in the right thecal sac on series 5, image 41 was initially felt probably related to the mixed injection surrounding the exiting right T12 nerve - although that seems less likely given the persistence on the delayed thoracic CT described above. No spinal stenosis at this  level, minor disc bulging and endplate spurring appear stable since July. L1-L2: Chronic mild retrolisthesis with disc space loss and circumferential disc osteophyte complex. Mild spinal stenosis and mild to moderate left lateral recess stenosis (series 5, image 57). Moderate bilateral L1 foraminal stenosis. This level appears stable since July. L2-L3: Circumferential disc bulge and mild to moderate posterior element hypertrophy. No spinal stenosis. Borderline to mild left lateral recess stenosis. Mild to moderate left and borderline to mild right L2 foraminal stenosis. L3: Posterior to the L3 vertebral body to the left of midline there is a small 4-5 millimeter low-density filling defect associated with the cauda equina (series 5, image 80 and sagittal series 8, image 35), around which there is mildly hyperdense contrast, which appears similar to the epidural/subdural contrast also in this region (see bilateral exiting L3 nerve roots). Just cephalad of this lesion and near the midline there is a subtle 2 millimeter area of thickening of the cauda equina (series 5, image 76). L3-L4: Posterior decompression of this level. Circumferential disc osteophyte complex and residual posterior element hypertrophy with widely patent thecal sac. Borderline to mild bilateral L3 foraminal stenosis. L4-L5: Chronic grade 2 spondylolisthesis with interbody ankylosis and posterior decompression. There is mildly irregular thickening of multiple cauda equina nerve roots also at this level (series 5, image 95) most of which are not displaced outwardly along the walls of the thecal sac. No spinal  stenosis. No lateral recess stenosis. Moderate to severe bilateral L4 foraminal stenosis which is probably greater on the right. L5-S1:  Mild disc bulge and endplate spurring without stenosis. IMPRESSION: 1. 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 Mild chronic  appearing lumbar spinal stenosis at L1-L2 is likely stable since that time, with decompressed L3-L4 and L4-L5 levels and no other significant lumbar spinal stenosis. 2. Non-specific thickening and nodularity of the cauda equina at the T12, L3, and L4 levels, with a superimposed unusual and bulbous appearance of the conus medullaris, which though initially felt related to a mixed contrast injection at the time of lumbar CT images (1133 hours) remained unchanged on delayed thoracic spine images at 1241 hours. And with no interval cephalad migration of intrathecal contrast, despite absent CT evidence of any thoracic spinal stenosis. 3. This constellation of clinical and imaging findings therefore raises the possibility of intrathecal metastatic disease with involvement of the conus and cauda equina. These studies were discussed by telephone with Dr. Kristeen Miss on 11/26/2018 at 13:20. Electronically Signed   By: Genevie Ann M.D.   On: 11/26/2018 13:25   Dg Myelogram Lumbar  Result Date: 11/26/2018 CLINICAL DATA:  73 year old male with grade 2 lower lumbar spondylolisthesis status post posterior decompression of the lumbar spine in May this year. Evidence of solid ankylosis at the L4-L5 spondylolisthesis level, however worsening weakness and neurologic symptoms since the time of surgery. History of lung cancer with multiple recurrences. FLUOROSCOPY TIME:  1 minutes 12 seconds PROCEDURE: LUMBAR PUNCTURE FOR THORACIC AND LUMBAR MYELOGRAM Lumbar puncture and intrathecal contrast administration were performed by Dr. Kristeen Miss who will separately report for the portion of the procedure. I personally supervised acquisition of the myelogram images. COMPARISON:  Postoperative lumbar spine CT 09/26/2018. Preoperative CT 07/10/2018. chest CT 07/10/2018. TECHNIQUE: Contiguous axial images were obtained through the Thoracic and Lumbar spine after the intrathecal infusion of infusion. Coronal and sagittal reconstructions were  obtained of the axial image sets. FINDINGS: MYELOGRAM FINDINGS: Lumbar puncture was performed at the upper lumbar level. On the initial postcontrast images there is 5-6 millimeter round filling defect occupying the left half of the thecal sac at the L3 vertebral body level. This became more difficult to see over the course of the myelographic images and will be reported in the lumbar CT below. Stable spondylolisthesis of L4 on L5. Stable vertebral height and alignment elsewhere including mild retrolisthesis of L1 on L2. Good lumbar intrathecal sac opacification, with no lumbar spinal stenosis from L2 to the sacrum. There is a defect on the ventral thecal sac at L1-L2 related to chronic disc osteophyte complex, appearing similar to that expected based on the July CT. A 2nd lateral view in mild Trendelenburg demonstrates contrast flow beyond this level, and normal appearing thecal sac patency at T12-L1. See additional post myelogram CT findings of the thoracic spine below. CT THORACIC MYELOGRAM FINDINGS: Decision was made following the acquisition of plain lumbar myelogram images to obtain CT post myelogram images also of the thoracic spine. Thoracic spine images were then obtained in a delayed fashion at 1241 hours (versus CT acquisition at 1133 hours of the lumbar spine. Only trace myelographic contrast is present in the thoracic spine, and seems to be mostly subdural space contrast. However, on axial series 7 images 68 through 110 there does appear to be a small amount of intrathecal thoracic contrast delineating a nonenlarged thoracic spinal cord from the T7  through the T10 level. Additionally the conus from T11-T12 continues to have an unusual post myelographic appearance which seems unlikely just to the mixed contrast injection (over an hour delayed from the lumbar images). And furthermore there remains a nodular filling defect in the right thecal sac at T12 (in addition to the multifocal cauda equina nodularity  described below). There is no evidence of degenerative thoracic spinal stenosis. No acute or suspicious osseous lesion is identified in the thoracic spine. Persistent masslike opacity at the left hilum and in the visible posterior left upper lung pleural space. Partially visible persistent right upper lobe curvilinear confluent opacity on series 6, image 40. No new or increased mediastinal lymph nodes are identified. CT LUMBAR MYELOGRAM FINDINGS: Stable visible abdominal viscera including Calcified aortic atherosclerosis. And right adrenal region surgical clips. Stable posterior paraspinal soft tissues. No acute osseous abnormality identified. Stable posterior postoperative changes at L3-L4 and L4-L5. Solid interbody ankylosis redemonstrated at L4-L5. Intact visible sacrum and SI joints. T12-L1: The tip of the conus is at this level, above which there is only trace subdural intraspinal contrast. A filling defect in the right thecal sac on series 5, image 41 was initially felt probably related to the mixed injection surrounding the exiting right T12 nerve - although that seems less likely given the persistence on the delayed thoracic CT described above. No spinal stenosis at this level, minor disc bulging and endplate spurring appear stable since July. L1-L2: Chronic mild retrolisthesis with disc space loss and circumferential disc osteophyte complex. Mild spinal stenosis and mild to moderate left lateral recess stenosis (series 5, image 57). Moderate bilateral L1 foraminal stenosis. This level appears stable since July. L2-L3: Circumferential disc bulge and mild to moderate posterior element hypertrophy. No spinal stenosis. Borderline to mild left lateral recess stenosis. Mild to moderate left and borderline to mild right L2 foraminal stenosis. L3: Posterior to the L3 vertebral body to the left of midline there is a small 4-5 millimeter low-density filling defect associated with the cauda equina (series 5, image 80  and sagittal series 8, image 35), around which there is mildly hyperdense contrast, which appears similar to the epidural/subdural contrast also in this region (see bilateral exiting L3 nerve roots). Just cephalad of this lesion and near the midline there is a subtle 2 millimeter area of thickening of the cauda equina (series 5, image 76). L3-L4: Posterior decompression of this level. Circumferential disc osteophyte complex and residual posterior element hypertrophy with widely patent thecal sac. Borderline to mild bilateral L3 foraminal stenosis. L4-L5: Chronic grade 2 spondylolisthesis with interbody ankylosis and posterior decompression. There is mildly irregular thickening of multiple cauda equina nerve roots also at this level (series 5, image 95) most of which are not displaced outwardly along the walls of the thecal sac. No spinal stenosis. No lateral recess stenosis. Moderate to severe bilateral L4 foraminal stenosis which is probably greater on the right. L5-S1:  Mild disc bulge and endplate spurring without stenosis. IMPRESSION: 1. 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 Mild chronic appearing lumbar spinal stenosis at L1-L2 is likely stable since that time, with decompressed L3-L4 and L4-L5 levels and no other significant lumbar spinal stenosis. 2. Non-specific thickening and nodularity of the cauda equina at the T12, L3, and L4 levels, with a superimposed unusual and bulbous appearance of the conus medullaris, which though initially felt related to a mixed contrast injection at the time of lumbar CT images (  1133 hours) remained unchanged on delayed thoracic spine images at 1241 hours. And with no interval cephalad migration of intrathecal contrast, despite absent CT evidence of any thoracic spinal stenosis. 3. 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 These studies were discussed by telephone with Dr. Kristeen Miss on 11/26/2018 at 13:20. Electronically Signed   By: Genevie Ann M.D.   On: 11/26/2018 13:25    Assessment: Bilateral lower extremity weakness without improvement following lumbar laminectomy in May 1. Exam reveals flaccid paralysis of BLE with sensory loss extending to approximately T12 bilaterally. The exam findings correlate with the nodularity involving the cauda equina and enlargement of the conus medullaris, most likely representing metastatic disease.  2. 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  Recommendations: 1. May not be able to obtain MRI of the thoracic and lumbar spine given that the patient has a pacemaker. Cardiology has been consulted to determine if pacemaker is MRI compatible, and if so, whether it can be turned on and off safely for MRI. If MRI can be obtained, will need to perform with contrast.  2. PT/OT 3. Hematology/Oncology consultation 4. Unclear if LP would change management. Will wait for MRI results and discuss further with patient, who states that he would like to avoid an LP if possible.   Electronically signed: Dr. Kerney Elbe 11/26/2018, 11:49 PM

## 2018-11-26 NOTE — Procedures (Signed)
Benjamin [name] is a 73 year old individua with progressive severe stenosis that was decompressed 4 mo ago. Since he has gotten progressively worse and myelogram is necessary to reevaluate his spinal canal because he has an implanted pacemaker.  Pre op JQ:GBEEFE stenosis with decompression, paraplegia Post op OF:HQRF   Procedure:Lumbar myelogram Surgeon:Derion Kreiter Puncture level:L2-3 Fluid color:clear colorless Injection:iohexol 200 12 ml Findings:stenosis mild in lumbar spine.questrion of thoracic stenosis. For CT

## 2018-11-26 NOTE — Progress Notes (Signed)
Report called to [place] on 3w Pt brother called and informed of pt going to floor and room number.

## 2018-11-26 NOTE — Progress Notes (Signed)
Dr Ellene Route called and states ok for pt to sit up. Order placed for regular diet.

## 2018-11-27 ENCOUNTER — Inpatient Hospital Stay (HOSPITAL_COMMUNITY): Payer: Medicare Other

## 2018-11-27 LAB — CBC
HCT: 39.6 % (ref 39.0–52.0)
Hemoglobin: 13.8 g/dL (ref 13.0–17.0)
MCH: 29.4 pg (ref 26.0–34.0)
MCHC: 34.8 g/dL (ref 30.0–36.0)
MCV: 84.3 fL (ref 80.0–100.0)
Platelets: 186 10*3/uL (ref 150–400)
RBC: 4.7 MIL/uL (ref 4.22–5.81)
RDW: 13.8 % (ref 11.5–15.5)
WBC: 7.5 10*3/uL (ref 4.0–10.5)
nRBC: 0 % (ref 0.0–0.2)

## 2018-11-27 LAB — CK TOTAL AND CKMB (NOT AT ARMC)
CK, MB: 2.7 ng/mL (ref 0.5–5.0)
Relative Index: INVALID (ref 0.0–2.5)
Total CK: 76 U/L (ref 49–397)

## 2018-11-27 LAB — COMPREHENSIVE METABOLIC PANEL
ALT: 13 U/L (ref 0–44)
AST: 14 U/L — ABNORMAL LOW (ref 15–41)
Albumin: 2.9 g/dL — ABNORMAL LOW (ref 3.5–5.0)
Alkaline Phosphatase: 88 U/L (ref 38–126)
Anion gap: 9 (ref 5–15)
BUN: 14 mg/dL (ref 8–23)
CO2: 24 mmol/L (ref 22–32)
Calcium: 8.5 mg/dL — ABNORMAL LOW (ref 8.9–10.3)
Chloride: 89 mmol/L — ABNORMAL LOW (ref 98–111)
Creatinine, Ser: 0.68 mg/dL (ref 0.61–1.24)
GFR calc Af Amer: >60 mL/min (ref >60)
GFR calc non Af Amer: >60 mL/min (ref >60)
Glucose, Bld: 112 mg/dL — ABNORMAL HIGH (ref 70–99)
Potassium: 3.9 mmol/L (ref 3.5–5.1)
Sodium: 122 mmol/L — ABNORMAL LOW (ref 135–145)
Total Bilirubin: 0.8 mg/dL (ref 0.3–1.2)
Total Protein: 5.8 g/dL — ABNORMAL LOW (ref 6.5–8.1)

## 2018-11-27 LAB — TSH: TSH: 1.309 u[IU]/mL (ref 0.350–4.500)

## 2018-11-27 LAB — SEDIMENTATION RATE: Sed Rate: 6 mm/hr (ref 0–16)

## 2018-11-27 LAB — OSMOLALITY, URINE: Osmolality, Ur: 451 mOsm/kg (ref 300–900)

## 2018-11-27 LAB — OSMOLALITY: Osmolality: 256 mOsm/kg — ABNORMAL LOW (ref 275–295)

## 2018-11-27 LAB — SODIUM, URINE, RANDOM: Sodium, Ur: 64 mmol/L

## 2018-11-27 LAB — CORTISOL: Cortisol, Plasma: 13.5 ug/dL

## 2018-11-27 LAB — SARS CORONAVIRUS 2 (TAT 6-24 HRS): SARS Coronavirus 2: NEGATIVE

## 2018-11-27 MED ORDER — METOPROLOL SUCCINATE ER 25 MG PO TB24
25.0000 mg | ORAL_TABLET | Freq: Every day | ORAL | Status: DC
  Administered 2018-11-28 – 2018-12-17 (×20): 25 mg via ORAL
  Filled 2018-11-27 (×20): qty 1

## 2018-11-27 MED ORDER — SODIUM CHLORIDE 1 G PO TABS
1.0000 g | ORAL_TABLET | Freq: Three times a day (TID) | ORAL | Status: DC
  Administered 2018-11-27 – 2018-12-17 (×59): 1 g via ORAL
  Filled 2018-11-27 (×65): qty 1

## 2018-11-27 NOTE — Progress Notes (Signed)
Reviewed with [employer] and Dr. Lovena Le. Pt leads, generator, and lead configuration are SAFE for MRI. We will work with MRI team to make any necessary programming changes for procedure.  Legrand Como [address] [place], [place]

## 2018-11-27 NOTE — Progress Notes (Signed)
   Awaiting word from [employer] for MRI safety.   He has a HIS bundle lead in his RA port, which may be an MRI exception in an otherwise MRI safe device.   Will update as soon as I hear back.   Shirley Friar, PA-C  Pager: [phone]  11/27/2018 8:21 AM

## 2018-11-27 NOTE — Progress Notes (Signed)
PROGRESS NOTE    Benjamin [name]  DXI:[id number] DOB: 28-Aug-1945 DOA: 11/26/2018 PCP: Hennie Duos, MD    Brief Narrative:  73 year old male with history of non-small cell lung cancer and paroxysmal atrial fibrillation on Eliquis at home presented to the [facility] for further evaluation of bilateral lower extremity thoracic paralysis with sensory loss since last May.  He started having some symptoms since April, rapidly progressive bilateral lower extremity weakness and sensory changes as well as bowel and bladder incontinence.  Patient underwent lumbar decompression surgery.  He did not have improvement of symptoms.  Patient also has history of lung cancer, he had radiation therapies and treatment at least 6 times for the cancer since 2003 as per patient.  Due to persistent symptoms and need for further management he was brought to the hospital by neurosurgery, Dr. Ellene Route. Admitted to medical service with neurology and neurosurgery following.   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  Suspected metastatic disease to spine.  CT myelogram with suspicious obstructive lesion.  MRI of the spine today.  Followed by neurology and surgery.  Continue supportive care.  Will need Foley catheter.  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  Fluid restrictions 1200 mL a day.  Will start patient on sodium tablets.  He is already on Aldactone and IV furosemide that should help.  Currently no indication for treatment with hypertonic saline, will continue to closely monitor.  Paroxysmal atrial fibrillation: Currently sinus rhythm.  Eliquis on hold for anticipated surgical procedures.  Complete heart block status  post pacemaker: Cardiology consulted.  Reportedly the device is MRI compatible.  Patient undergoing MRI T-spine and L-spine.  Coronary artery disease: Stable.  Currently on atorvastatin and metoprolol.   DVT prophylaxis: Lovenox Code Status: Full code Family Communication: None Disposition Plan: Skilled nursing facility when stable   Consultants:   Neurosurgery  Neurology  Procedures:   CT myelogram, 11/26/2018  Antimicrobials:   None   Subjective: Patient was seen and examined.  He was eating lunch.  Denies any complaints.  Patient does state that he drinks plenty of water.  Objective: Vitals:   11/27/18 0324 11/27/18 0757 11/27/18 0808 11/27/18 1132  BP: 122/69  115/82 114/75  Pulse: 66 68 68 61  Resp: 18 18 18 20   Temp: 97.8 F (36.6 C)  97.6 F (36.4 C) (!) 97.5 F (36.4 C)  TempSrc: Oral  Axillary Oral  SpO2: 100% 98% 94% 98%    Intake/Output Summary (Last 24 hours) at 11/27/2018 1446 Last data filed at 11/27/2018 1250 Gross per 24 hour  Intake 647.26 ml  Output 2100 ml  Net -1452.74 ml   There were no vitals filed for this visit.  Examination:  General exam: Appears calm and comfortable  Respiratory system: Clear to auscultation. Respiratory effort normal. Cardiovascular system: S1 & S2 heard, RRR. No JVD, murmurs, rubs, gallops or clicks. No pedal edema. Gastrointestinal system: Abdomen is nondistended, soft and nontender. No organomegaly or masses felt. Normal bowel sounds heard. Central nervous system: Alert and oriented. No focal neurological deficits. Extremities: Upper extremities bilateral equal with slight generalized weakness. Lower extremities, hip movements 0/5.  Ankles and knees 2/5.  Absent superficial sensation.  Absent patellar  and Achilles reflexes bilaterally. Skin: No rashes, lesions or ulcers Psychiatry: Judgement and insight appear normal. Mood & affect appropriate.     Data Reviewed: I have personally reviewed following labs and  imaging studies  CBC: Recent Labs  Lab 11/26/18 1840 11/27/18 0316  WBC 8.6 7.5  NEUTROABS 6.9  --   HGB 14.6 13.8  HCT 42.6 39.6  MCV 85.2 84.3  PLT 219 161   Basic Metabolic Panel: Recent Labs  Lab 11/26/18 1840 11/27/18 0316  NA 124* 122*  K 4.0 3.9  CL 88* 89*  CO2 26 24  GLUCOSE 145* 112*  BUN 14 14  CREATININE 0.72 0.68  CALCIUM 8.4* 8.5*   GFR: Estimated Creatinine Clearance: 85 mL/min (by C-G formula based on SCr of 0.68 mg/dL). Liver Function Tests: Recent Labs  Lab 11/26/18 1840 11/27/18 0316  AST 16 14*  ALT 15 13  ALKPHOS 103 88  BILITOT 1.0 0.8  PROT 6.3* 5.8*  ALBUMIN 3.1* 2.9*   No results for input(s): LIPASE, AMYLASE in the last 168 hours. No results for input(s): AMMONIA in the last 168 hours. Coagulation Profile: No results for input(s): INR, PROTIME in the last 168 hours. Cardiac Enzymes: Recent Labs  Lab 11/27/18 0316  CKTOTAL 76  CKMB 2.7   BNP (last 3 results) No results for input(s): PROBNP in the last 8760 hours. HbA1C: No results for input(s): HGBA1C in the last 72 hours. CBG: No results for input(s): GLUCAP in the last 168 hours. Lipid Profile: No results for input(s): CHOL, HDL, LDLCALC, TRIG, CHOLHDL, LDLDIRECT in the last 72 hours. Thyroid Function Tests: Recent Labs    11/27/18 0316  TSH 1.309   Anemia Panel: No results for input(s): VITAMINB12, FOLATE, FERRITIN, TIBC, IRON, RETICCTPCT in the last 72 hours. Sepsis Labs: No results for input(s): PROCALCITON, LATICACIDVEN in the last 168 hours.  No results found for this or any previous visit (from the past 240 hour(s)).       Radiology Studies: Ct Thoracic Spine W Contrast  Result Date: 11/26/2018 CLINICAL DATA:  73 year old male with grade 2 lower lumbar spondylolisthesis status post posterior decompression of the lumbar spine in May this year. Evidence of solid ankylosis at the L4-L5 spondylolisthesis level, however worsening weakness and neurologic  symptoms since the time of surgery. History of lung cancer with multiple recurrences. FLUOROSCOPY TIME:  1 minutes 12 seconds PROCEDURE: LUMBAR PUNCTURE FOR THORACIC AND LUMBAR MYELOGRAM Lumbar puncture and intrathecal contrast administration were performed by Dr. Kristeen Miss who will separately report for the portion of the procedure. I personally supervised acquisition of the myelogram images. COMPARISON:  Postoperative lumbar spine CT 09/26/2018. Preoperative CT 07/10/2018. chest CT 07/10/2018. TECHNIQUE: Contiguous axial images were obtained through the Thoracic and Lumbar spine after the intrathecal infusion of infusion. Coronal and sagittal reconstructions were obtained of the axial image sets. FINDINGS: MYELOGRAM FINDINGS: Lumbar puncture was performed at the upper lumbar level. On the initial postcontrast images there is 5-6 millimeter round filling defect occupying the left half of the thecal sac at the L3 vertebral body level. This became more difficult to see over the course of the myelographic images and will be reported in the lumbar CT below. Stable spondylolisthesis of L4 on L5. Stable vertebral height and alignment elsewhere including mild retrolisthesis of L1 on L2. Good lumbar intrathecal sac opacification, with no lumbar spinal stenosis from L2 to the sacrum. There is a defect on the ventral thecal sac at L1-L2 related to chronic disc osteophyte complex,  appearing similar to that expected based on the July CT. A 2nd lateral view in mild Trendelenburg demonstrates contrast flow beyond this level, and normal appearing thecal sac patency at T12-L1. See additional post myelogram CT findings of the thoracic spine below. CT THORACIC MYELOGRAM FINDINGS: Decision was made following the acquisition of plain lumbar myelogram images to obtain CT post myelogram images also of the thoracic spine. Thoracic spine images were then obtained in a delayed fashion at 1241 hours (versus CT acquisition at 1133 hours  of the lumbar spine. Only trace myelographic contrast is present in the thoracic spine, and seems to be mostly subdural space contrast. However, on axial series 7 images 68 through 110 there does appear to be a small amount of intrathecal thoracic contrast delineating a nonenlarged thoracic spinal cord from the T7 through the T10 level. Additionally the conus from T11-T12 continues to have an unusual post myelographic appearance which seems unlikely just to the mixed contrast injection (over an hour delayed from the lumbar images). And furthermore there remains a nodular filling defect in the right thecal sac at T12 (in addition to the multifocal cauda equina nodularity described below). There is no evidence of degenerative thoracic spinal stenosis. No acute or suspicious osseous lesion is identified in the thoracic spine. Persistent masslike opacity at the left hilum and in the visible posterior left upper lung pleural space. Partially visible persistent right upper lobe curvilinear confluent opacity on series 6, image 40. No new or increased mediastinal lymph nodes are identified. CT LUMBAR MYELOGRAM FINDINGS: Stable visible abdominal viscera including Calcified aortic atherosclerosis. And right adrenal region surgical clips. Stable posterior paraspinal soft tissues. No acute osseous abnormality identified. Stable posterior postoperative changes at L3-L4 and L4-L5. Solid interbody ankylosis redemonstrated at L4-L5. Intact visible sacrum and SI joints. T12-L1: The tip of the conus is at this level, above which there is only trace subdural intraspinal contrast. A filling defect in the right thecal sac on series 5, image 41 was initially felt probably related to the mixed injection surrounding the exiting right T12 nerve - although that seems less likely given the persistence on the delayed thoracic CT described above. No spinal stenosis at this level, minor disc bulging and endplate spurring appear stable since  July. L1-L2: Chronic mild retrolisthesis with disc space loss and circumferential disc osteophyte complex. Mild spinal stenosis and mild to moderate left lateral recess stenosis (series 5, image 57). Moderate bilateral L1 foraminal stenosis. This level appears stable since July. L2-L3: Circumferential disc bulge and mild to moderate posterior element hypertrophy. No spinal stenosis. Borderline to mild left lateral recess stenosis. Mild to moderate left and borderline to mild right L2 foraminal stenosis. L3: Posterior to the L3 vertebral body to the left of midline there is a small 4-5 millimeter low-density filling defect associated with the cauda equina (series 5, image 80 and sagittal series 8, image 35), around which there is mildly hyperdense contrast, which appears similar to the epidural/subdural contrast also in this region (see bilateral exiting L3 nerve roots). Just cephalad of this lesion and near the midline there is a subtle 2 millimeter area of thickening of the cauda equina (series 5, image 76). L3-L4: Posterior decompression of this level. Circumferential disc osteophyte complex and residual posterior element hypertrophy with widely patent thecal sac. Borderline to mild bilateral L3 foraminal stenosis. L4-L5: Chronic grade 2 spondylolisthesis with interbody ankylosis and posterior decompression. There is mildly irregular thickening of multiple cauda equina nerve roots also at this level (series  5, image 95) most of which are not displaced outwardly along the walls of the thecal sac. No spinal stenosis. No lateral recess stenosis. Moderate to severe bilateral L4 foraminal stenosis which is probably greater on the right. L5-S1:  Mild disc bulge and endplate spurring without stenosis. IMPRESSION: 1. 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 Mild chronic appearing lumbar spinal stenosis at L1-L2 is likely stable since that  time, with decompressed L3-L4 and L4-L5 levels and no other significant lumbar spinal stenosis. 2. Non-specific thickening and nodularity of the cauda equina at the T12, L3, and L4 levels, with a superimposed unusual and bulbous appearance of the conus medullaris, which though initially felt related to a mixed contrast injection at the time of lumbar CT images (1133 hours) remained unchanged on delayed thoracic spine images at 1241 hours. And with no interval cephalad migration of intrathecal contrast, despite absent CT evidence of any thoracic spinal stenosis. 3. This constellation of clinical and imaging findings therefore raises the possibility of intrathecal metastatic disease with involvement of the conus and cauda equina. These studies were discussed by telephone with Dr. Kristeen Miss on 11/26/2018 at 13:20. Electronically Signed   By: Genevie Ann M.D.   On: 11/26/2018 13:25   Ct Lumbar Spine W Contrast  Result Date: 11/26/2018 CLINICAL DATA:  73 year old male with grade 2 lower lumbar spondylolisthesis status post posterior decompression of the lumbar spine in May this year. Evidence of solid ankylosis at the L4-L5 spondylolisthesis level, however worsening weakness and neurologic symptoms since the time of surgery. History of lung cancer with multiple recurrences. FLUOROSCOPY TIME:  1 minutes 12 seconds PROCEDURE: LUMBAR PUNCTURE FOR THORACIC AND LUMBAR MYELOGRAM Lumbar puncture and intrathecal contrast administration were performed by Dr. Kristeen Miss who will separately report for the portion of the procedure. I personally supervised acquisition of the myelogram images. COMPARISON:  Postoperative lumbar spine CT 09/26/2018. Preoperative CT 07/10/2018. chest CT 07/10/2018. TECHNIQUE: Contiguous axial images were obtained through the Thoracic and Lumbar spine after the intrathecal infusion of infusion. Coronal and sagittal reconstructions were obtained of the axial image sets. FINDINGS: MYELOGRAM FINDINGS:  Lumbar puncture was performed at the upper lumbar level. On the initial postcontrast images there is 5-6 millimeter round filling defect occupying the left half of the thecal sac at the L3 vertebral body level. This became more difficult to see over the course of the myelographic images and will be reported in the lumbar CT below. Stable spondylolisthesis of L4 on L5. Stable vertebral height and alignment elsewhere including mild retrolisthesis of L1 on L2. Good lumbar intrathecal sac opacification, with no lumbar spinal stenosis from L2 to the sacrum. There is a defect on the ventral thecal sac at L1-L2 related to chronic disc osteophyte complex, appearing similar to that expected based on the July CT. A 2nd lateral view in mild Trendelenburg demonstrates contrast flow beyond this level, and normal appearing thecal sac patency at T12-L1. See additional post myelogram CT findings of the thoracic spine below. CT THORACIC MYELOGRAM FINDINGS: Decision was made following the acquisition of plain lumbar myelogram images to obtain CT post myelogram images also of the thoracic spine. Thoracic spine images were then obtained in a delayed fashion at 1241 hours (versus CT acquisition at 1133 hours of the lumbar spine. Only trace myelographic contrast is present in the thoracic spine, and seems to be mostly subdural space contrast. However, on axial series 7 images 68 through 110  there does appear to be a small amount of intrathecal thoracic contrast delineating a nonenlarged thoracic spinal cord from the T7 through the T10 level. Additionally the conus from T11-T12 continues to have an unusual post myelographic appearance which seems unlikely just to the mixed contrast injection (over an hour delayed from the lumbar images). And furthermore there remains a nodular filling defect in the right thecal sac at T12 (in addition to the multifocal cauda equina nodularity described below). There is no evidence of degenerative thoracic  spinal stenosis. No acute or suspicious osseous lesion is identified in the thoracic spine. Persistent masslike opacity at the left hilum and in the visible posterior left upper lung pleural space. Partially visible persistent right upper lobe curvilinear confluent opacity on series 6, image 40. No new or increased mediastinal lymph nodes are identified. CT LUMBAR MYELOGRAM FINDINGS: Stable visible abdominal viscera including Calcified aortic atherosclerosis. And right adrenal region surgical clips. Stable posterior paraspinal soft tissues. No acute osseous abnormality identified. Stable posterior postoperative changes at L3-L4 and L4-L5. Solid interbody ankylosis redemonstrated at L4-L5. Intact visible sacrum and SI joints. T12-L1: The tip of the conus is at this level, above which there is only trace subdural intraspinal contrast. A filling defect in the right thecal sac on series 5, image 41 was initially felt probably related to the mixed injection surrounding the exiting right T12 nerve - although that seems less likely given the persistence on the delayed thoracic CT described above. No spinal stenosis at this level, minor disc bulging and endplate spurring appear stable since July. L1-L2: Chronic mild retrolisthesis with disc space loss and circumferential disc osteophyte complex. Mild spinal stenosis and mild to moderate left lateral recess stenosis (series 5, image 57). Moderate bilateral L1 foraminal stenosis. This level appears stable since July. L2-L3: Circumferential disc bulge and mild to moderate posterior element hypertrophy. No spinal stenosis. Borderline to mild left lateral recess stenosis. Mild to moderate left and borderline to mild right L2 foraminal stenosis. L3: Posterior to the L3 vertebral body to the left of midline there is a small 4-5 millimeter low-density filling defect associated with the cauda equina (series 5, image 80 and sagittal series 8, image 35), around which there is mildly  hyperdense contrast, which appears similar to the epidural/subdural contrast also in this region (see bilateral exiting L3 nerve roots). Just cephalad of this lesion and near the midline there is a subtle 2 millimeter area of thickening of the cauda equina (series 5, image 76). L3-L4: Posterior decompression of this level. Circumferential disc osteophyte complex and residual posterior element hypertrophy with widely patent thecal sac. Borderline to mild bilateral L3 foraminal stenosis. L4-L5: Chronic grade 2 spondylolisthesis with interbody ankylosis and posterior decompression. There is mildly irregular thickening of multiple cauda equina nerve roots also at this level (series 5, image 95) most of which are not displaced outwardly along the walls of the thecal sac. No spinal stenosis. No lateral recess stenosis. Moderate to severe bilateral L4 foraminal stenosis which is probably greater on the right. L5-S1:  Mild disc bulge and endplate spurring without stenosis. IMPRESSION: 1. 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 Mild chronic appearing lumbar spinal stenosis at L1-L2 is likely stable since that time, with decompressed L3-L4 and L4-L5 levels and no other significant lumbar spinal stenosis. 2. Non-specific thickening and nodularity of the cauda equina at the T12, L3, and L4 levels, with a superimposed unusual and bulbous appearance  of the conus medullaris, which though initially felt related to a mixed contrast injection at the time of lumbar CT images (1133 hours) remained unchanged on delayed thoracic spine images at 1241 hours. And with no interval cephalad migration of intrathecal contrast, despite absent CT evidence of any thoracic spinal stenosis. 3. This constellation of clinical and imaging findings therefore raises the possibility of intrathecal metastatic disease with involvement of the conus and cauda equina. These  studies were discussed by telephone with Dr. Kristeen Miss on 11/26/2018 at 13:20. Electronically Signed   By: Genevie Ann M.D.   On: 11/26/2018 13:25   Dg Myelogram Lumbar  Result Date: 11/26/2018 CLINICAL DATA:  73 year old male with grade 2 lower lumbar spondylolisthesis status post posterior decompression of the lumbar spine in May this year. Evidence of solid ankylosis at the L4-L5 spondylolisthesis level, however worsening weakness and neurologic symptoms since the time of surgery. History of lung cancer with multiple recurrences. FLUOROSCOPY TIME:  1 minutes 12 seconds PROCEDURE: LUMBAR PUNCTURE FOR THORACIC AND LUMBAR MYELOGRAM Lumbar puncture and intrathecal contrast administration were performed by Dr. Kristeen Miss who will separately report for the portion of the procedure. I personally supervised acquisition of the myelogram images. COMPARISON:  Postoperative lumbar spine CT 09/26/2018. Preoperative CT 07/10/2018. chest CT 07/10/2018. TECHNIQUE: Contiguous axial images were obtained through the Thoracic and Lumbar spine after the intrathecal infusion of infusion. Coronal and sagittal reconstructions were obtained of the axial image sets. FINDINGS: MYELOGRAM FINDINGS: Lumbar puncture was performed at the upper lumbar level. On the initial postcontrast images there is 5-6 millimeter round filling defect occupying the left half of the thecal sac at the L3 vertebral body level. This became more difficult to see over the course of the myelographic images and will be reported in the lumbar CT below. Stable spondylolisthesis of L4 on L5. Stable vertebral height and alignment elsewhere including mild retrolisthesis of L1 on L2. Good lumbar intrathecal sac opacification, with no lumbar spinal stenosis from L2 to the sacrum. There is a defect on the ventral thecal sac at L1-L2 related to chronic disc osteophyte complex, appearing similar to that expected based on the July CT. A 2nd lateral view in mild Trendelenburg  demonstrates contrast flow beyond this level, and normal appearing thecal sac patency at T12-L1. See additional post myelogram CT findings of the thoracic spine below. CT THORACIC MYELOGRAM FINDINGS: Decision was made following the acquisition of plain lumbar myelogram images to obtain CT post myelogram images also of the thoracic spine. Thoracic spine images were then obtained in a delayed fashion at 1241 hours (versus CT acquisition at 1133 hours of the lumbar spine. Only trace myelographic contrast is present in the thoracic spine, and seems to be mostly subdural space contrast. However, on axial series 7 images 68 through 110 there does appear to be a small amount of intrathecal thoracic contrast delineating a nonenlarged thoracic spinal cord from the T7 through the T10 level. Additionally the conus from T11-T12 continues to have an unusual post myelographic appearance which seems unlikely just to the mixed contrast injection (over an hour delayed from the lumbar images). And furthermore there remains a nodular filling defect in the right thecal sac at T12 (in addition to the multifocal cauda equina nodularity described below). There is no evidence of degenerative thoracic spinal stenosis. No acute or suspicious osseous lesion is identified in the thoracic spine. Persistent masslike opacity at the left hilum and in the visible posterior left upper lung pleural space. Partially visible  persistent right upper lobe curvilinear confluent opacity on series 6, image 40. No new or increased mediastinal lymph nodes are identified. CT LUMBAR MYELOGRAM FINDINGS: Stable visible abdominal viscera including Calcified aortic atherosclerosis. And right adrenal region surgical clips. Stable posterior paraspinal soft tissues. No acute osseous abnormality identified. Stable posterior postoperative changes at L3-L4 and L4-L5. Solid interbody ankylosis redemonstrated at L4-L5. Intact visible sacrum and SI joints. T12-L1: The tip of  the conus is at this level, above which there is only trace subdural intraspinal contrast. A filling defect in the right thecal sac on series 5, image 41 was initially felt probably related to the mixed injection surrounding the exiting right T12 nerve - although that seems less likely given the persistence on the delayed thoracic CT described above. No spinal stenosis at this level, minor disc bulging and endplate spurring appear stable since July. L1-L2: Chronic mild retrolisthesis with disc space loss and circumferential disc osteophyte complex. Mild spinal stenosis and mild to moderate left lateral recess stenosis (series 5, image 57). Moderate bilateral L1 foraminal stenosis. This level appears stable since July. L2-L3: Circumferential disc bulge and mild to moderate posterior element hypertrophy. No spinal stenosis. Borderline to mild left lateral recess stenosis. Mild to moderate left and borderline to mild right L2 foraminal stenosis. L3: Posterior to the L3 vertebral body to the left of midline there is a small 4-5 millimeter low-density filling defect associated with the cauda equina (series 5, image 80 and sagittal series 8, image 35), around which there is mildly hyperdense contrast, which appears similar to the epidural/subdural contrast also in this region (see bilateral exiting L3 nerve roots). Just cephalad of this lesion and near the midline there is a subtle 2 millimeter area of thickening of the cauda equina (series 5, image 76). L3-L4: Posterior decompression of this level. Circumferential disc osteophyte complex and residual posterior element hypertrophy with widely patent thecal sac. Borderline to mild bilateral L3 foraminal stenosis. L4-L5: Chronic grade 2 spondylolisthesis with interbody ankylosis and posterior decompression. There is mildly irregular thickening of multiple cauda equina nerve roots also at this level (series 5, image 95) most of which are not displaced outwardly along the  walls of the thecal sac. No spinal stenosis. No lateral recess stenosis. Moderate to severe bilateral L4 foraminal stenosis which is probably greater on the right. L5-S1:  Mild disc bulge and endplate spurring without stenosis. IMPRESSION: 1. 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 Mild chronic appearing lumbar spinal stenosis at L1-L2 is likely stable since that time, with decompressed L3-L4 and L4-L5 levels and no other significant lumbar spinal stenosis. 2. Non-specific thickening and nodularity of the cauda equina at the T12, L3, and L4 levels, with a superimposed unusual and bulbous appearance of the conus medullaris, which though initially felt related to a mixed contrast injection at the time of lumbar CT images (1133 hours) remained unchanged on delayed thoracic spine images at 1241 hours. And with no interval cephalad migration of intrathecal contrast, despite absent CT evidence of any thoracic spinal stenosis. 3. This constellation of clinical and imaging findings therefore raises the possibility of intrathecal metastatic disease with involvement of the conus and cauda equina. These studies were discussed by telephone with Dr. Kristeen Miss on 11/26/2018 at 13:20. Electronically Signed   By: Genevie Ann M.D.   On: 11/26/2018 13:25        Scheduled Meds: . atorvastatin  20 mg Oral Daily  .  clonazePAM  1 mg Oral TID  . enoxaparin (LOVENOX) injection  40 mg Subcutaneous Q24H  . feeding supplement (PRO-STAT SUGAR FREE 64)  30 mL Oral BID  . furosemide  20 mg Intravenous Daily  . gabapentin  100 mg Oral QHS  . ipratropium-albuterol  3 mL Nebulization BID  . metoprolol tartrate  25 mg Oral Daily  . sodium chloride  1 g Oral TID WC  . spironolactone  25 mg Oral Daily  . tamsulosin  0.4 mg Oral QPM   Continuous Infusions:   LOS: 1 day    Time spent: 25 minutes     Barb Merino, MD [facility] Pager  [phone]  If 7PM-7AM, please contact night-coverage [email or web address] Password [email or web address] 11/27/2018, 2:46 PM

## 2018-11-27 NOTE — Progress Notes (Deleted)
LE venous duplex pending June Leap, BS, RDMS, RVT

## 2018-11-27 NOTE — Progress Notes (Signed)
Patient ID: OBIE KALLENBACH, male   DOB: Feb 17, 1946, 73 y.o.   MRN: [id number] Appreciate the help of Dr. Cheral Marker.  Hopefully we can obtain an MRI of the thoracic spine if not and he requires further lumbar puncture he may be a candidate for a C1-C2 puncture with further myelography we can obtain CSF at that time.  I will perform that procedure if it becomes necessary.

## 2018-11-28 ENCOUNTER — Inpatient Hospital Stay (HOSPITAL_COMMUNITY): Payer: Medicare Other

## 2018-11-28 DIAGNOSIS — R609 Edema, unspecified: Secondary | ICD-10-CM

## 2018-11-28 LAB — BASIC METABOLIC PANEL
Anion gap: 10 (ref 5–15)
BUN: 19 mg/dL (ref 8–23)
CO2: 22 mmol/L (ref 22–32)
Calcium: 8.5 mg/dL — ABNORMAL LOW (ref 8.9–10.3)
Chloride: 92 mmol/L — ABNORMAL LOW (ref 98–111)
Creatinine, Ser: 0.62 mg/dL (ref 0.61–1.24)
GFR calc Af Amer: >60 mL/min (ref >60)
GFR calc non Af Amer: >60 mL/min (ref >60)
Glucose, Bld: 102 mg/dL — ABNORMAL HIGH (ref 70–99)
Potassium: 3.9 mmol/L (ref 3.5–5.1)
Sodium: 124 mmol/L — ABNORMAL LOW (ref 135–145)

## 2018-11-28 LAB — ANA: Anti Nuclear Antibody (ANA): NEGATIVE

## 2018-11-28 MED ORDER — GADOBUTROL 1 MMOL/ML IV SOLN
9.0000 mL | Freq: Once | INTRAVENOUS | Status: AC | PRN
  Administered 2018-11-28: 12:00:00 9 mL via INTRAVENOUS

## 2018-11-28 NOTE — Progress Notes (Addendum)
PROGRESS NOTE    Benjamin [name]  BWG:[id number] DOB: 07/06/1945 DOA: 11/26/2018 PCP: Hennie Duos, MD    Brief Narrative:  73 year old male with history of non-small cell lung cancer and paroxysmal atrial fibrillation on Eliquis at home presented to the [facility] for further evaluation of bilateral lower extremity thoracic paralysis with sensory loss since last May.  He started having some symptoms since April, rapidly progressive bilateral lower extremity weakness and sensory changes as well as bowel and bladder incontinence.  Patient underwent lumbar decompression surgery.  He did not have improvement of symptoms.  Patient also has history of lung cancer, he had radiation therapies and treatment at least 6 times for the cancer since 2003 as per patient.  Due to persistent symptoms and need for further management he was brought to the hospital by neurosurgery, Dr. Ellene Route. Admitted to medical service with neurology and neurosurgery following.   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  MRI consistent with multilevel spinal cancer.  No bony involvement.  History of lung cancer.  Followed by neurosurgery, may need biopsy.  Continue supportive care.  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  Discontinue Lovenox in anticipation for any surgery.  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  Fluid restrictions 1200 mL a day.  Started on salt tablets.  Sodium is 124. He is already on Aldactone and IV furosemide that should help.  Currently no indication for treatment with hypertonic saline, will continue to closely monitor.  Paroxysmal atrial fibrillation: Currently sinus  rhythm.  Eliquis on hold for anticipated surgical procedures.  Complete heart block status post pacemaker: Cardiology consulted.  Stable.   Chronic diastolic heart failure: euvolumic, stable.   Coronary artery disease: Stable.  Currently on atorvastatin and metoprolol.  Patient's brother was at the bedside.  We discussed about today's finding of MRI. Discussed with neurosurgery, Dr. Ellene Route who is going to talk to patient.  DVT prophylaxis: SCDs Code Status: Full code Family Communication: None Disposition Plan: Skilled nursing facility when stable, probably after biopsy.   Consultants:   Neurosurgery  Neurology, signed off  Procedures:   CT myelogram, 11/26/2018  Antimicrobials:   None   Subjective: Patient was seen and examined.  No overnight events.  Brother at bedside.  Sodium is 124.  Objective: Vitals:   11/28/18 0750 11/28/18 0954 11/28/18 1307 11/28/18 1541  BP: 121/71  117/70 117/73  Pulse: 63 78 64 61  Resp: 20  16 20   Temp: 97.6 F (36.4 C)  (!) 97.5 F (36.4 C) 98.1 F (36.7 C)  TempSrc: Oral  Oral Oral  SpO2: (!) 86%  100% (!) 89%    Intake/Output Summary (Last 24 hours) at 11/28/2018 1658 Last data filed at 11/28/2018 1550 Gross per 24 hour  Intake 240 ml  Output 5200 ml  Net -4960 ml   There were no vitals filed for this visit.  Examination:  General exam: Appears calm and comfortable  Respiratory system: Clear to auscultation. Respiratory effort normal. Cardiovascular system: S1 & S2 heard, RRR. No JVD, murmurs, rubs, gallops or clicks. No pedal edema. Gastrointestinal system: Abdomen is nondistended, soft and nontender. No organomegaly or masses felt. Normal bowel sounds  heard.  Foley catheter with clear urine. Central nervous system: Alert and oriented. No focal neurological deficits. Extremities: Upper extremities bilateral equal with slight generalized weakness. Lower extremities, hip movements 0/5.  Ankles and knees 2/5.  Absent  superficial sensation.  Absent patellar and Achilles reflexes bilaterally. Skin: No rashes, lesions or ulcers Psychiatry: Judgement and insight appear normal. Mood & affect appropriate.     Data Reviewed: I have personally reviewed following labs and imaging studies  CBC: Recent Labs  Lab 11/26/18 1840 11/27/18 0316  WBC 8.6 7.5  NEUTROABS 6.9  --   HGB 14.6 13.8  HCT 42.6 39.6  MCV 85.2 84.3  PLT 219 408   Basic Metabolic Panel: Recent Labs  Lab 11/26/18 1840 11/27/18 0316 11/28/18 0330  NA 124* 122* 124*  K 4.0 3.9 3.9  CL 88* 89* 92*  CO2 26 24 22   GLUCOSE 145* 112* 102*  BUN 14 14 19   CREATININE 0.72 0.68 0.62  CALCIUM 8.4* 8.5* 8.5*   GFR: Estimated Creatinine Clearance: 85 mL/min (by C-G formula based on SCr of 0.62 mg/dL). Liver Function Tests: Recent Labs  Lab 11/26/18 1840 11/27/18 0316  AST 16 14*  ALT 15 13  ALKPHOS 103 88  BILITOT 1.0 0.8  PROT 6.3* 5.8*  ALBUMIN 3.1* 2.9*   No results for input(s): LIPASE, AMYLASE in the last 168 hours. No results for input(s): AMMONIA in the last 168 hours. Coagulation Profile: No results for input(s): INR, PROTIME in the last 168 hours. Cardiac Enzymes: Recent Labs  Lab 11/27/18 0316  CKTOTAL 76  CKMB 2.7   BNP (last 3 results) No results for input(s): PROBNP in the last 8760 hours. HbA1C: No results for input(s): HGBA1C in the last 72 hours. CBG: No results for input(s): GLUCAP in the last 168 hours. Lipid Profile: No results for input(s): CHOL, HDL, LDLCALC, TRIG, CHOLHDL, LDLDIRECT in the last 72 hours. Thyroid Function Tests: Recent Labs    11/27/18 0316  TSH 1.309   Anemia Panel: No results for input(s): VITAMINB12, FOLATE, FERRITIN, TIBC, IRON, RETICCTPCT in the last 72 hours. Sepsis Labs: No results for input(s): PROCALCITON, LATICACIDVEN in the last 168 hours.  Recent Results (from the past 240 hour(s))  SARS CORONAVIRUS 2 (TAT 6-24 HRS) Nasopharyngeal Nasopharyngeal Swab      Status: None   Collection Time: 11/27/18  8:51 AM   Specimen: Nasopharyngeal Swab  Result Value Ref Range Status   SARS Coronavirus 2 NEGATIVE NEGATIVE Final    Comment: (NOTE) SARS-CoV-2 target nucleic acids are NOT DETECTED. The SARS-CoV-2 RNA is generally detectable in upper and lower respiratory specimens during the acute phase of infection. Negative results do not preclude SARS-CoV-2 infection, do not rule out co-infections with other pathogens, and should not be used as the sole basis for treatment or other patient management decisions. Negative results must be combined with clinical observations, patient history, and epidemiological information. The expected result is Negative. Fact Sheet for Patients: SugarRoll.be Fact Sheet for Healthcare Providers: https://www.woods-mathews.com/ This test is not yet approved or cleared by the [place] FDA and  has been authorized for detection and/or diagnosis of SARS-CoV-2 by FDA under an Emergency Use Authorization (EUA). This EUA will remain  in effect (meaning this test can be used) for the duration of the COVID-19 declaration under Section 56 4(b)(1) of the Act, 21 U.S.C. section 360bbb-3(b)(1), unless the authorization is terminated or revoked sooner. Performed at [facility], [place] [address] [place], [place] [address]  Radiology Studies: Mr Thoracic Spine W Wo Contrast  Result Date: 11/28/2018 CLINICAL DATA:  Bilateral leg weakness. Abnormal CT myelogram 11/26/2018. History of lung cancer. The patient has a pacemaker which is MRI compatible. EXAM: MRI THORACIC WITHOUT AND WITH CONTRAST TECHNIQUE: Multiplanar and multiecho pulse sequences of the thoracic spine were obtained without and with intravenous contrast. CONTRAST:  40mL GADAVIST GADOBUTROL 1 MMOL/ML IV SOLN COMPARISON:  CT myelogram 11/26/2018 FINDINGS: MRI THORACIC SPINE FINDINGS Alignment:  Normal Vertebrae:  Fatty bone marrow changes are present T3 through T7 compatible prior chest radiation for left-sided lung cancer. No thoracic fracture or metastatic disease to the spine. Cord: Expansile mass lesion in the conus medullaris at the T11 and T12 level. The mass measures 14 x 19 x 37 mm and appears intramedullary. There is significant cord edema above the mass. Cord edema continues into the mid and upper thoracic cord. Postcontrast images reveal multiple enhancing masses along the surface of the cord at T1, T2, T3, T5, and T6. Likely several other smaller lesions are also present. Enhancement extends into the left T3 neural foramina. Paraspinal and other soft tissues: Left upper lobe mass. No significant pleural effusion. Right upper lobe abnormality possibly metastatic disease. Disc levels: 2 mm anterolisthesis C7-T1 due to facet degeneration. Mild thoracic disc degeneration. Negative for disc protrusion. IMPRESSION: Enhancing mass lesion in the conus medullaris. Multiple small enhancing masses along the surface of the cord in the upper and midthoracic spine. 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 Recommend MRI of the brain without with contrast evaluate for metastatic disease. No evidence of bony metastatic disease These results were called by telephone at the time of interpretation on 11/28/2018 at 2:35 pm to provider [facility] , who verbally acknowledged these results. Electronically Signed   By: Franchot Gallo M.D.   On: 11/28/2018 14:36   Mr Lumbar Spine W Wo Contrast  Result Date: 11/28/2018 CLINICAL DATA:  Back pain. 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 History of lung cancer. Lumbar laminectomy 07/25/2018 L4-5 level. Abnormal myelogram 11/26/2018 EXAM: MRI LUMBAR SPINE WITHOUT AND WITH CONTRAST TECHNIQUE: Multiplanar and multiecho pulse sequences of the lumbar spine were obtained without and with intravenous contrast. CONTRAST:  70mL  GADAVIST GADOBUTROL 1 MMOL/ML IV SOLN COMPARISON:  Thoracic and lumbar myelogram 11/26/2018 FINDINGS: Segmentation:  Normal Alignment:  Mild retrolisthesis L1-2, 4 mm. 16 mm anterolisthesis L4-5 with severe disc degeneration at this level and bilateral pars defects of L4 Vertebrae: Negative for vertebral fracture or mass lesion. Small Schmorl's node with surrounding bone marrow edema at L1-2. Conus medullaris and cauda equina: Conus medullaris appears to terminate at T12-L1. There is and expansile mass lesion in the distal spinal cord and conus medullaris which shows heterogeneous signal intensity and enhancement. The mass measures approximately 14 x 9 mm on transverse images and extends over a 37 mm length. Small areas of hemorrhage are present within the mass. This accounts for the intradural mass identified on myelogram. There is edema in the cord above the mass. Paraspinal and other soft tissues: Negative for retroperitoneal mass or adenopathy. Urinary bladder is distended with Foley catheter. Disc levels: T12-L1: Mild facet degeneration.  Negative for stenosis L1-2: 4 mm retrolisthesis with disc and facet degeneration. Mild spinal stenosis and mild subarticular stenosis bilaterally L2-3: Diffuse bulging of the disc. Shallow foraminal disc protrusion bilaterally. Mild facet degeneration. Mild spinal stenosis and mild subarticular stenosis bilaterally L3-4: Diffuse disc bulging and mild facet degeneration. Negative for stenosis L4-5:  16 mm anterolisthesis with bilateral pars defects. Moderate to severe foraminal encroachment bilaterally with impingement of the L4 nerve root bilaterally. L5-S1: Small central disc protrusion. Mild flattening of the right S1 nerve root. IMPRESSION: Enhancing mass involving the distal spinal cord and conus medullaris. This caused myelographic near complete block 2 days ago. 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 Review of the thoracic MRI demonstrates  multiple enhancing masses along the surface of the cord in the thoracic spine, therefore metastatic disease is felt to be most likely. Negative for bony metastatic disease lumbar spine Grade 2 slip L4-5 due to bilateral pars defects of L4 resulting and moderate to severe foraminal encroachment bilaterally. These results were called by telephone at the time of interpretation on 11/28/2018 at 2:26 pm to provider [facility] , who verbally acknowledged these results. Electronically Signed   By: Franchot Gallo M.D.   On: 11/28/2018 14:28   Vas [place] Lower Extremity Venous (dvt)  Result Date: 11/28/2018  Lower Venous Study Indications: Edema.  Risk Factors: Cancer metastatic. Comparison Study: 05/17/24 bilateral lev negative Performing Technologist: Toma Copier RVS  Examination Guidelines: A complete evaluation includes B-mode imaging, spectral Doppler, color Doppler, and power Doppler as needed of all accessible portions of each vessel. Bilateral testing is considered an integral part of a complete examination. Limited examinations for reoccurring indications may be performed as noted.  +---------+---------------+---------+-----------+----------+-------------------+  RIGHT     Compressibility Phasicity Spontaneity Properties Thrombus Aging       +---------+---------------+---------+-----------+----------+-------------------+  CFV       Full            Yes       Yes                                         +---------+---------------+---------+-----------+----------+-------------------+  SFJ       Full                                                                  +---------+---------------+---------+-----------+----------+-------------------+  FV Prox   Full            Yes       Yes                                         +---------+---------------+---------+-----------+----------+-------------------+  FV Mid    Full                                                                   +---------+---------------+---------+-----------+----------+-------------------+  FV Distal Full            Yes       Yes                                         +---------+---------------+---------+-----------+----------+-------------------+  PFV       Full            Yes       Yes                                         +---------+---------------+---------+-----------+----------+-------------------+  POP                       Yes       Yes                    Patient unable to                                                                bend the knee for                                                                compression          +---------+---------------+---------+-----------+----------+-------------------+  PTV       Full                                                                  +---------+---------------+---------+-----------+----------+-------------------+  PERO      Full                                                                  +---------+---------------+---------+-----------+----------+-------------------+   +---------+---------------+---------+-----------+----------+--------------+  LEFT      Compressibility Phasicity Spontaneity Properties Thrombus Aging  +---------+---------------+---------+-----------+----------+--------------+  CFV       Full            Yes       Yes                                    +---------+---------------+---------+-----------+----------+--------------+  SFJ       Full                                                             +---------+---------------+---------+-----------+----------+--------------+  FV Prox   Full            Yes       Yes                                    +---------+---------------+---------+-----------+----------+--------------+  FV Mid    Full                                                             +---------+---------------+---------+-----------+----------+--------------+  FV Distal Full            Yes       Yes                                     +---------+---------------+---------+-----------+----------+--------------+  PFV       Full            Yes       Yes                                    +---------+---------------+---------+-----------+----------+--------------+  POP       Full            Yes       Yes                                    +---------+---------------+---------+-----------+----------+--------------+  PTV       Full                                                             +---------+---------------+---------+-----------+----------+--------------+  PERO      Full                                                             +---------+---------------+---------+-----------+----------+--------------+     Summary: Right: There is no evidence of deep vein thrombosis in the lower extremity. No cystic structure found in the popliteal fossa. Left: There is no evidence of deep vein thrombosis in the lower extremity. No cystic structure found in the popliteal fossa.  *See table(s) above for measurements and observations. Electronically signed by Monica Martinez MD on 11/28/2018 at 3:19:13 PM.    Final         Scheduled Meds:  atorvastatin  20 mg Oral Daily   clonazePAM  1 mg Oral TID   feeding supplement (PRO-STAT SUGAR FREE 64)  30 mL Oral BID   furosemide  20 mg Intravenous Daily   gabapentin  100 mg Oral QHS   ipratropium-albuterol  3 mL Nebulization BID   metoprolol succinate  25 mg Oral Daily   sodium chloride  1 g Oral TID WC   spironolactone  25 mg Oral Daily   tamsulosin  0.4 mg Oral QPM   Continuous Infusions:   LOS: 2 days    Time spent: 25 minutes     Barb Merino, MD [facility] Pager [phone]  If 7PM-7AM, please contact night-coverage [email or web address] Password [email or web address] 11/28/2018, 4:58 PM

## 2018-11-28 NOTE — Progress Notes (Signed)
Chart and imaging review note  MRI completed, result below. D/W [facility] - Dr. Carlis Abbott  MR T and L spine IMPRESSION: Enhancing mass involving the distal spinal cord and conus medullaris. This caused myelographic near complete block 2 days ago. 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 Review of the thoracic MRI demonstrates multiple enhancing masses along the surface of the cord in the thoracic spine, therefore metastatic disease is felt to be most likely.  Negative for bony metastatic disease lumbar spine  Grade 2 slip L4-5 due to bilateral pars defects of L4 resulting and moderate to severe foraminal encroachment bilaterally.    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  NSGY can be contacted for LP per Dr. Clarice Pole note from 11/27/2018  D/w Dr. Silvano Bilis over the phone.  Please call with questions, we will be available as needed.  -- Amie Portland, MD [facility] Pager: [phone] If 7pm to 7am, please call on call as listed on AMION.

## 2018-11-28 NOTE — Progress Notes (Signed)
Patient ID: Benjamin [name], male   DOB: [birth year], 73 y.o.   MRN: [id number] MRI reviewed.  Intramedullary lesion at T12 explains is lower extremity paralysis nicely.  There does appear to be a second lesion at T3.  Concern about other intramedullary lesions into the brain also is of concern.  I believe this would be best resolved with an MRI of the brain.  We will see if this can be obtained in the next day or so.  Will discuss at cancer conference what would be the best approach.  I do not believe that surgical resection is likely to yield return of function in the lower extremities but certainly would allow for some element of decompression.

## 2018-11-28 NOTE — Progress Notes (Signed)
Bilateral lower extremity venous duplex completed. Preliminary results in Chart review CV [place], [place] 11/28/2018, 2:08 PM

## 2018-11-29 ENCOUNTER — Other Ambulatory Visit: Payer: Self-pay | Admitting: Radiation Therapy

## 2018-11-29 ENCOUNTER — Inpatient Hospital Stay (HOSPITAL_COMMUNITY): Payer: Medicare Other

## 2018-11-29 LAB — BASIC METABOLIC PANEL
Anion gap: 10 (ref 5–15)
BUN: 20 mg/dL (ref 8–23)
CO2: 24 mmol/L (ref 22–32)
Calcium: 8.6 mg/dL — ABNORMAL LOW (ref 8.9–10.3)
Chloride: 94 mmol/L — ABNORMAL LOW (ref 98–111)
Creatinine, Ser: 0.73 mg/dL (ref 0.61–1.24)
GFR calc Af Amer: >60 mL/min (ref >60)
GFR calc non Af Amer: >60 mL/min (ref >60)
Glucose, Bld: 106 mg/dL — ABNORMAL HIGH (ref 70–99)
Potassium: 4.1 mmol/L (ref 3.5–5.1)
Sodium: 128 mmol/L — ABNORMAL LOW (ref 135–145)

## 2018-11-29 LAB — ACETYLCHOLINE RECEPTOR, BINDING: Acety choline binding ab: 0.06 nmol/L (ref 0.00–0.24)

## 2018-11-29 MED ORDER — GADOBUTROL 1 MMOL/ML IV SOLN
10.0000 mL | Freq: Once | INTRAVENOUS | Status: AC | PRN
  Administered 2018-11-29: 13:00:00 10 mL via INTRAVENOUS

## 2018-11-29 NOTE — Progress Notes (Signed)
PROGRESS NOTE  KELSEY EDMAN  DOB: 23-Aug-1945  PCP: Hennie Duos, MD YHC:[id number]  DOA: 11/26/2018  LOS: 3 days   Brief narrative: Benjamin [name] is a 73 y.o. male with PMH of non-small cell lung cancer and paroxysmal atrial fibrillation on Eliquis at home. Patient presented to the ED on 11/26/2018 for further evaluation of bilateral lower extremity thoracic paralysis with sensory loss since last May.  He started having some symptoms since April, rapidly progressive bilateral lower extremity weakness and sensory changes as well as bowel and bladder incontinence.  Patient underwent lumbar decompression surgery.  He did not have improvement of symptoms.  Patient also has history of lung cancer, he had radiation therapies and treatment at least 6 times for the cancer since 2003 as per patient.  Due to persistent symptoms and need for further management he was brought to the hospital by neurosurgery, Dr. Ellene Route. Admitted to medical service with neurology and neurosurgery following.  Subjective: Patient was seen and examined this morning.  Lying on bed.  Not in distress.  Assessment/Plan:  Bilateral lower extremity weakness with paraplegia:  Likely from multiple spinal tumor.  MRI consistent with multilevel spinal cancer.  No bony involvement.  History of lung cancer.  Followed by neurosurgery, may need biopsy.  Continue supportive care.  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  Off Lovenox in anticipation for any surgery. MRI brain per neurosurgery.  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  Fluid restrictions 1200 mL a day.  Started on salt tablets.  Sodium is 128. He is already on Aldactone and IV furosemide that should help.  Currently no indication for treatment with hypertonic saline, will continue to closely monitor.  Paroxysmal atrial fibrillation: Currently sinus rhythm.  Eliquis on hold for anticipated surgical procedures.  Complete heart block status post  pacemaker: Cardiology consulted.  Stable.   Chronic diastolic heart failure: euvolumic, stable.   Coronary artery disease: Stable.  Currently on atorvastatin and metoprolol.  Mobility: Paraplegic Diet: Cardiac diet DVT prophylaxis:  SCDs Code Status:   Code Status: Full Code  Family Communication:  Expected Discharge:  Pending neurosurgery clearance.   Consultants:  Neurosurgery  Neurology, signed off  Antimicrobials: Anti-infectives (From admission, onward)   None     Infusions:    Scheduled Meds: . atorvastatin  20 mg Oral Daily  . clonazePAM  1 mg Oral TID  . feeding supplement (PRO-STAT SUGAR FREE 64)  30 mL Oral BID  . furosemide  20 mg Intravenous Daily  . gabapentin  100 mg Oral QHS  . ipratropium-albuterol  3 mL Nebulization BID  . metoprolol succinate  25 mg Oral Daily  . sodium chloride  1 g Oral TID WC  . spironolactone  25 mg Oral Daily  . tamsulosin  0.4 mg Oral QPM   PRN meds: acetaminophen, HYDROcodone-acetaminophen, magnesium hydroxide, nitroGLYCERIN   Objective: Vitals:   11/29/18 0726 11/29/18 1035  BP:  127/69  Pulse: 65 62  Resp: 18 20  Temp:  98 F (36.7 C)  SpO2:  98%    Intake/Output Summary (Last 24 hours) at 11/29/2018 1244 Last data filed at 11/29/2018 1033 Gross per 24 hour  Intake 600 ml  Output 4200 ml  Net -3600 ml   There were no vitals filed for this visit. Weight change:  There is no height or weight on file to calculate BMI.   Physical Exam: General exam: Appears calm and comfortable.  Not in distress Skin: No rashes, lesions or  ulcers. HEENT: Atraumatic, normocephalic, supple neck, no obvious bleeding Lungs: Clear to auscultation bilaterally CVS: Regular rate and rhythm, no murmur GI/Abd soft, nontender, nondistended, bowel sound present CNS: Alert, awake, oriented x3, paraplegic Psychiatry: Mood appropriate, judgment and insight normal Extremities: No edema, no calf tenderness  Data Review: I have  personally reviewed the laboratory data and studies available.  Recent Labs  Lab 11/26/18 1840 11/27/18 0316  WBC 8.6 7.5  NEUTROABS 6.9  --   HGB 14.6 13.8  HCT 42.6 39.6  MCV 85.2 84.3  PLT 219 186   Recent Labs  Lab 11/26/18 1840 11/27/18 0316 11/28/18 0330 11/29/18 0314  NA 124* 122* 124* 128*  K 4.0 3.9 3.9 4.1  CL 88* 89* 92* 94*  CO2 26 24 22 24   GLUCOSE 145* 112* 102* 106*  BUN 14 14 19 20   CREATININE 0.72 0.68 0.62 0.73  CALCIUM 8.4* 8.5* 8.5* 8.6*    [name], MD  [facility] 11/29/2018

## 2018-11-29 NOTE — Progress Notes (Signed)
Patient ID: Benjamin [name], male   DOB: 12-Jul-1945, 73 y.o.   MRN: [id number] MRI shows no evidence of intracranial lesions.  Main lesion appears at the level of the conus.  We will discuss this in tumor conference.  We will review plans after discussion on Monday morning.

## 2018-11-29 NOTE — Progress Notes (Signed)
Patient ID: Benjamin [name], male   DOB: [birth year], 73 y.o.   MRN: [id number] Gershon Mussel is about the same this morning It would be helpful to obtain an MRI of the brain to rule out any intracranial lesions Plan to discuss him in the cancer conference on Monday regarding further treatment Continue supportive care over the weekend

## 2018-11-29 NOTE — Care Management Important Message (Signed)
Important Message  Patient Details  Name: Benjamin [name] MRN: [id number] Date of Birth: February 18, 1946   Medicare Important Message Given:  Yes     Orbie Pyo 11/29/2018, 2:22 PM

## 2018-11-30 LAB — BASIC METABOLIC PANEL
Anion gap: 9 (ref 5–15)
BUN: 21 mg/dL (ref 8–23)
CO2: 25 mmol/L (ref 22–32)
Calcium: 8.6 mg/dL — ABNORMAL LOW (ref 8.9–10.3)
Chloride: 94 mmol/L — ABNORMAL LOW (ref 98–111)
Creatinine, Ser: 0.76 mg/dL (ref 0.61–1.24)
GFR calc Af Amer: >60 mL/min (ref >60)
GFR calc non Af Amer: >60 mL/min (ref >60)
Glucose, Bld: 106 mg/dL — ABNORMAL HIGH (ref 70–99)
Potassium: 4.2 mmol/L (ref 3.5–5.1)
Sodium: 128 mmol/L — ABNORMAL LOW (ref 135–145)

## 2018-11-30 MED ORDER — FUROSEMIDE 40 MG PO TABS
40.0000 mg | ORAL_TABLET | Freq: Every day | ORAL | Status: DC
  Administered 2018-12-01 – 2018-12-17 (×17): 40 mg via ORAL
  Filled 2018-11-30 (×17): qty 1

## 2018-11-30 NOTE — Progress Notes (Signed)
PROGRESS NOTE  Benjamin [name]  DOB: February 14, 1946  PCP: Hennie Duos, MD OVZ:[id number]  DOA: 11/26/2018  LOS: 4 days   Brief narrative: Benjamin [name] is a 73 y.o. male with PMH of non-small cell lung cancer and paroxysmal atrial fibrillation on Eliquis at home. Patient presented to the ED on 11/26/2018 for further evaluation of bilateral lower extremity thoracic paralysis with sensory loss since last May. He started having some symptoms since April, rapidly progressive bilateral lower extremity weakness and sensory changes as well as bowel and bladder incontinence. Patient underwent lumbar decompression surgery. He did not have improvement of symptoms. Patient also has history of lung cancer, he had radiation therapies and treatment at least 6 times for the cancer since 2003 as per patient. Due to persistent symptoms and need for further management he was brought to the hospital by neurosurgery, Dr. Ellene Route. Admitted to medical service with neurology and neurosurgery following.  Subjective: Patient was seen and examined this morning.  Not in distress.  No new symptoms.  Continues to have paraplegia.  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 MRI consistent with multilevel spinal cancer. No bony involvement. History of lung cancer. Followed by neurosurgery, may need biopsy. Continue supportive care.  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  Off Lovenox in anticipation for any surgery. MRI brain 9/11 shows no metastatic disease to the brain or cerebral meninges.  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 Fluid restrictions 1200 mL a day. Started on salt tablets. Sodium remains at 128.  Continue Lasix and Aldactone. Currently no indication for treatment with hypertonic saline, will continue to closely monitor.  Paroxysmal atrial fibrillation: Currently sinus rhythm. Eliquis on hold for anticipated  surgical procedures.  Complete heart block status post pacemaker: Cardiology consulted.Stable.   Chronic diastolic heart failure: euvolumic, stable.  Coronary artery disease: Stable. Currently on atorvastatin and metoprolol.  Mobility: Paraplegic Diet: Cardiac diet DVT prophylaxis: SCDs Code Status:  Code Status: Full Code  Family Communication: Expected Discharge: Pending neurosurgery clearance.  Noted a plan of tumor board meeting on Monday.  Consultants:  Neurosurgery  Neurology, signed off  Antimicrobials: Anti-infectives (From admission, onward)   None     Infusions:    Scheduled Meds: . atorvastatin  20 mg Oral Daily  . clonazePAM  1 mg Oral TID  . feeding supplement (PRO-STAT SUGAR FREE 64)  30 mL Oral BID  . furosemide  20 mg Intravenous Daily  . gabapentin  100 mg Oral QHS  . ipratropium-albuterol  3 mL Nebulization BID  . metoprolol succinate  25 mg Oral Daily  . sodium chloride  1 g Oral TID WC  . spironolactone  25 mg Oral Daily  . tamsulosin  0.4 mg Oral QPM    PRN meds: acetaminophen, HYDROcodone-acetaminophen, magnesium hydroxide, nitroGLYCERIN   Objective: Vitals:   11/30/18 0830 11/30/18 0843  BP:  130/74  Pulse:  61  Resp:  14  Temp:  97.7 F (36.5 C)  SpO2: 97% 93%    Intake/Output Summary (Last 24 hours) at 11/30/2018 1236 Last data filed at 11/30/2018 1204 Gross per 24 hour  Intake -  Output 3560 ml  Net -3560 ml   There were no vitals filed for this visit. Weight change:  There is no height or weight on file to calculate BMI.   Physical Exam: General exam: Appears calm and comfortable.  Lying in bed.  Not in distress Skin: No rashes, lesions or ulcers. HEENT: Atraumatic,  normocephalic, supple neck, no obvious bleeding Lungs: Clear to auscultation bilaterally CVS: Regular rate and rhythm, no murmur GI/Abd soft, nontender, nondistended, also present CNS: Alert, awake, oriented x3 Psychiatry: Mood appropriate  Extremities: No pedal edema, no calf tenderness  Data Review: I have personally reviewed the laboratory data and studies available.  Recent Labs  Lab 11/26/18 1840 11/27/18 0316  WBC 8.6 7.5  NEUTROABS 6.9  --   HGB 14.6 13.8  HCT 42.6 39.6  MCV 85.2 84.3  PLT 219 186   Recent Labs  Lab 11/26/18 1840 11/27/18 0316 11/28/18 0330 11/29/18 0314 11/30/18 0320  NA 124* 122* 124* 128* 128*  K 4.0 3.9 3.9 4.1 4.2  CL 88* 89* 92* 94* 94*  CO2 26 24 22 24 25   GLUCOSE 145* 112* 102* 106* 106*  BUN 14 14 19 20 21   CREATININE 0.72 0.68 0.62 0.73 0.76  CALCIUM 8.4* 8.5* 8.5* 8.6* 8.6*    [name], MD  [facility] 11/30/2018

## 2018-12-01 LAB — CBC WITH DIFFERENTIAL/PLATELET
Abs Immature Granulocytes: 0.01 10*3/uL (ref 0.00–0.07)
Basophils Absolute: 0 10*3/uL (ref 0.0–0.1)
Basophils Relative: 0 %
Eosinophils Absolute: 0.8 10*3/uL — ABNORMAL HIGH (ref 0.0–0.5)
Eosinophils Relative: 11 %
HCT: 40.5 % (ref 39.0–52.0)
Hemoglobin: 14 g/dL (ref 13.0–17.0)
Immature Granulocytes: 0 %
Lymphocytes Relative: 11 %
Lymphs Abs: 0.8 10*3/uL (ref 0.7–4.0)
MCH: 29.7 pg (ref 26.0–34.0)
MCHC: 34.6 g/dL (ref 30.0–36.0)
MCV: 85.8 fL (ref 80.0–100.0)
Monocytes Absolute: 0.7 10*3/uL (ref 0.1–1.0)
Monocytes Relative: 9 %
Neutro Abs: 5.1 10*3/uL (ref 1.7–7.7)
Neutrophils Relative %: 69 %
Platelets: 207 10*3/uL (ref 150–400)
RBC: 4.72 MIL/uL (ref 4.22–5.81)
RDW: 14.1 % (ref 11.5–15.5)
WBC: 7.4 10*3/uL (ref 4.0–10.5)
nRBC: 0 % (ref 0.0–0.2)

## 2018-12-01 LAB — BASIC METABOLIC PANEL
Anion gap: 8 (ref 5–15)
BUN: 27 mg/dL — ABNORMAL HIGH (ref 8–23)
CO2: 25 mmol/L (ref 22–32)
Calcium: 8.8 mg/dL — ABNORMAL LOW (ref 8.9–10.3)
Chloride: 96 mmol/L — ABNORMAL LOW (ref 98–111)
Creatinine, Ser: 0.72 mg/dL (ref 0.61–1.24)
GFR calc Af Amer: >60 mL/min (ref >60)
GFR calc non Af Amer: >60 mL/min (ref >60)
Glucose, Bld: 107 mg/dL — ABNORMAL HIGH (ref 70–99)
Potassium: 4.3 mmol/L (ref 3.5–5.1)
Sodium: 129 mmol/L — ABNORMAL LOW (ref 135–145)

## 2018-12-01 NOTE — Progress Notes (Signed)
PROGRESS NOTE  Benjamin [name]  DOB: 1945/07/20  PCP: Hennie Duos, MD BSW:[id number]  DOA: 11/26/2018  LOS: 5 days   Brief narrative: Benjamin [name] is a 73 y.o. male with PMH of non-small cell lung cancer and paroxysmal atrial fibrillation on Eliquis at home. Patient presented to the ED on 11/26/2018 for further evaluation of bilateral lower extremity thoracic paralysis with sensory loss since last May. He started having some symptoms since April, rapidly progressive bilateral lower extremity weakness and sensory changes as well as bowel and bladder incontinence. Patient underwent lumbar decompression surgery. He did not have improvement of symptoms. Patient also has history of lung cancer, he had radiation therapies and treatment at least 6 times for the cancer since 2003 as per patient. Due to persistent symptoms and need for further management he was brought to the hospital by neurosurgery, Dr. Ellene Route. Admitted to medical service with neurology and neurosurgery following.  Subjective: Patient was seen and examined this morning.  Not in distress.  No new symptoms.  Continues to have paraplegia.  No improvement.  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 MRI consistent with multilevel spinal cancer. No bony involvement. History of lung cancer. Followed by neurosurgery, may need biopsy. Continue supportive care.  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  Off Lovenox in anticipation for any surgery. MRI brain 9/11 shows no metastatic disease to the brain or cerebral meninges.  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 Fluid restrictions 1200 mL a day. Started on salt tablets. Sodium level gradually improving, 129 today.  Continue Lasix and Aldactone. Currently no indication for treatment with hypertonic saline, will continue to closely monitor.  Paroxysmal atrial fibrillation: Currently sinus  rhythm. Eliquis on hold for anticipated surgical procedures.  Complete heart block status post pacemaker: Cardiology consulted.Stable.   Chronic diastolic heart failure: euvolumic, stable.  Coronary artery disease: Stable. Currently on atorvastatin and metoprolol.  Mobility: Paraplegic Diet: Cardiac diet DVT prophylaxis: SCDs Code Status:  Code Status: Full Code  Family Communication: Expected Discharge: Pending neurosurgery clearance.  Noted a plan of tumor board meeting on Monday.  Consultants:  Neurosurgery  Neurology, signed off  Antimicrobials: Anti-infectives (From admission, onward)   None     Infusions:    Scheduled Meds: . atorvastatin  20 mg Oral Daily  . clonazePAM  1 mg Oral TID  . feeding supplement (PRO-STAT SUGAR FREE 64)  30 mL Oral BID  . furosemide  40 mg Oral Daily  . gabapentin  100 mg Oral QHS  . ipratropium-albuterol  3 mL Nebulization BID  . metoprolol succinate  25 mg Oral Daily  . sodium chloride  1 g Oral TID WC  . spironolactone  25 mg Oral Daily  . tamsulosin  0.4 mg Oral QPM    PRN meds: acetaminophen, HYDROcodone-acetaminophen, magnesium hydroxide, nitroGLYCERIN   Objective: Vitals:   12/01/18 0812 12/01/18 0831  BP: 121/81   Pulse: 64   Resp: 16   Temp: (!) 96.7 F (35.9 C)   SpO2: 97% 96%    Intake/Output Summary (Last 24 hours) at 12/01/2018 1058 Last data filed at 12/01/2018 0900 Gross per 24 hour  Intake 480 ml  Output 4750 ml  Net -4270 ml   There were no vitals filed for this visit. Weight change:  There is no height or weight on file to calculate BMI.   Physical Exam: General exam: Appears calm and comfortable.  Lying in bed.  Not in distress Skin:  No rashes, lesions or ulcers. HEENT: Atraumatic, normocephalic, supple neck, no obvious bleeding Lungs: Clear to auscultation bilaterally CVS: Regular rate and rhythm, no murmur GI/Abd soft, nontender, nondistended, also present CNS: Alert, awake,  oriented x3 Psychiatry: Mood appropriate Extremities: No pedal edema, no calf tenderness  Data Review: I have personally reviewed the laboratory data and studies available.  Recent Labs  Lab 11/26/18 1840 11/27/18 0316 12/01/18 0324  WBC 8.6 7.5 7.4  NEUTROABS 6.9  --  5.1  HGB 14.6 13.8 14.0  HCT 42.6 39.6 40.5  MCV 85.2 84.3 85.8  PLT 219 186 207   Recent Labs  Lab 11/27/18 0316 11/28/18 0330 11/29/18 0314 11/30/18 0320 12/01/18 0324  NA 122* 124* 128* 128* 129*  K 3.9 3.9 4.1 4.2 4.3  CL 89* 92* 94* 94* 96*  CO2 24 22 24 25 25   GLUCOSE 112* 102* 106* 106* 107*  BUN 14 19 20 21  27*  CREATININE 0.68 0.62 0.73 0.76 0.72  CALCIUM 8.5* 8.5* 8.6* 8.6* 8.8*    [name], MD  [facility] 12/01/2018

## 2018-12-01 NOTE — Evaluation (Signed)
Physical Therapy Evaluation Patient Details Name: Benjamin [name] MRN: [id number] DOB: March 28, 1945 Today's Date: 12/01/2018   History of Present Illness  73 year old male with history of non-small cell lung cancer and paroxysmal atrial fibrillation on Eliquis at home presented to the [facility] for further evaluation of bilateral lower extremity thoracic paralysis with sensory loss since last May.  He started having some symptoms since April, rapidly progressive bilateral lower extremity weakness and sensory changes as well as bowel and bladder incontinence.  Patient underwent lumbar decompression surgery.  He did not have improvement of symptoms.  Patient also has history of lung cancer, he had radiation therapies and treatment at least 6 times for the cancer since 2003 as per patient.  Due to persistent symptoms and need for further management he was brought to the hospital by neurosurgery, Dr. Ellene Route    Clinical Impression  Pt has weakness and numbness in legs - non functional. Pt has intense back pain - 9/10 when I went in the room and 7/10 after sitting EOB.  Pt Max assist to get sitting EOB and then abel to sit up for 25 minutes with CGA.  Felt better - once up.  Pt gets OOB into wheelchair with lift only.  Focused on education of positioning and skin integrity.  Pt would benefit from trapeze on the bed to be able to help himself with mobilty.  Pt has supportive brother and niece in the area. He will have to return SNF level care from the hospital.  Pt to talk with MD tomorrow about next plan.    Follow Up Recommendations SNF;Supervision/Assistance - 24 hour    Equipment Recommendations  None recommended by PT    Recommendations for Other Services       Precautions / Restrictions Precautions Precautions: Back;Fall Precaution Comments: pt reminded of back precautons - to help with comfort/pain Restrictions Other Position/Activity Restrictions: educated pt on importance of skin  care - floating heels, rolling side to side      Mobility  Bed Mobility Overal bed mobility: Needs Assistance Bed Mobility: Supine to Sit     Supine to sit: Max assist;Mod assist     General bed mobility comments: pt rolled with assist with legs and then pt pushed up to sitting EOB with assist to get legs off bed and to help raise his body from the bed.  Pt needed assist to get squared up - unabel to adjust himself due to the pain  Transfers                 General transfer comment: pt said he tried the sliding board a few times at SNF but not strong enough to do it.  Pt will be lift only for OOB.  Pt has sling in room - encouraged him to request to get up  Ambulation/Gait                Stairs            Wheelchair Mobility    Modified Rankin (Stroke Patients Only)       Balance Overall balance assessment: Needs assistance   Sitting balance-Leahy Scale: Poor Sitting balance - Comments: Once balanced - pt sat EOB for 25 minutes in sacral sitting position.  I cued him to sit taller - he tried but hurt too much.  pt started out holding onto the rail but then was able to put hands down nad sit.  Pt unable to shift weight or lean  out of base of support - cant correct this and limited by pain                                     Pertinent Vitals/Pain Pain Assessment: 0-10 Pain Score: 9  Pain Location: low back, legs, left breast area Pain Intervention(s): Monitored during session;Repositioned    Home Living Family/patient expects to be discharged to:: Skilled nursing facility                 Additional Comments: Pt came from [facility].    Prior Function           Comments: he was getting up with lift only. He was in wheelchair during the days - he could push himself but would start hurting between his shoulder blades     Hand Dominance        Extremity/Trunk Assessment        Lower Extremity  Assessment Lower Extremity Assessment: (pt with trace movement in hips and knees - no functional movement)    Cervical / Trunk Assessment Cervical / Trunk Assessment: Kyphotic  Communication   Communication: HOH  Cognition Arousal/Alertness: Awake/alert Behavior During Therapy: WFL for tasks assessed/performed Overall Cognitive Status: Within Functional Limits for tasks assessed                                 General Comments: Pt sad about situation - talked about all his 30+ years of retirement money - now will be used up      General Comments General comments (skin integrity, edema, etc.): Educated pt on importance of skin integrity - floating heels, rolling side to side.  Really emphasized the importance of this. Heels look really good today    Exercises Other Exercises Other Exercises: Pt suping - i did PROM heel slides to legs - initially - intense pain in hamstrings from movement   Assessment/Plan    PT Assessment Patient needs continued PT services  PT Problem List Decreased strength;Decreased mobility;Decreased knowledge of precautions;Decreased activity tolerance;Impaired sensation;Pain       PT Treatment Interventions Functional mobility training;Therapeutic exercise;Patient/family education    PT Goals (Current goals can be found in the Care Plan section)  Acute Rehab PT Goals Patient Stated Goal: to figure out waht is next - wouldnt wish this on a dog PT Goal Formulation: With patient Time For Goal Achievement: 12/15/18 Potential to Achieve Goals: Poor    Frequency Min 2X/week   Barriers to discharge        Co-evaluation               AM-PAC PT "6 Clicks" Mobility  Outcome Measure Help needed turning from your back to your side while in a flat bed without using bedrails?: Total Help needed moving from lying on your back to sitting on the side of a flat bed without using bedrails?: Total Help needed moving to and from a bed to a chair  (including a wheelchair)?: Total Help needed standing up from a chair using your arms (e.g., wheelchair or bedside chair)?: Total Help needed to walk in hospital room?: Total Help needed climbing 3-5 steps with a railing? : Total 6 Click Score: 6    End of Session   Activity Tolerance: Patient tolerated treatment well;Patient limited by pain Patient left: in bed;with call bell/phone within  reach;with bed alarm set Nurse Communication: Mobility status PT Visit Diagnosis: Other symptoms and signs involving the nervous system (R29.898);Muscle weakness (generalized) (M62.81);Pain    Time: 1415-1505 PT Time Calculation (min) (ACUTE ONLY): 50 min   Charges:   PT Evaluation $PT Eval Moderate Complexity: 1 Mod PT Treatments $Therapeutic Activity: 23-37 mins        12/01/2018   Rande Lawman, PT   Loyal Buba 12/01/2018, 3:34 PM

## 2018-12-02 ENCOUNTER — Ambulatory Visit
Admit: 2018-12-02 | Discharge: 2018-12-02 | Disposition: A | Discharge status: Home / Self Care | Payer: Medicare Other | Attending: Radiation Oncology | Admitting: Radiation Oncology

## 2018-12-02 ENCOUNTER — Inpatient Hospital Stay (HOSPITAL_COMMUNITY): Payer: Medicare Other

## 2018-12-02 DIAGNOSIS — C7949 Secondary malignant neoplasm of other parts of nervous system: Secondary | ICD-10-CM

## 2018-12-02 MED ORDER — IOHEXOL 300 MG/ML  SOLN
100.0000 mL | Freq: Once | INTRAMUSCULAR | Status: AC | PRN
  Administered 2018-12-02: 14:00:00 100 mL via INTRAVENOUS

## 2018-12-02 NOTE — Progress Notes (Signed)
  Radiation Oncology         [phone] ________________________________  Name: Benjamin [name] MRN: [id number]  Date: 11/26/2018  DOB: [birth year]  Chart Note:  I reviewed this patient's most recent findings and wanted to take a minute to document my impression. 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  His lung cancer has been well controlled without evidence of active disease.  In April, he presented with back pain and right lower extremity weakness.  CT spine on 07/10/18 showed BILATERAL L4 and L5 neural impingement; asymmetrically severe impingement of the RIGHT L4 nerve root is noted related to calcified disc material.  Accordingly, on 07/25/18, he had L4L5 laminectomy.  He was noted to have bilateral lower extremity weakness and numbness.  He underwent MRI of the thoracic and lumbar spine on 11/28/18.  This study showed an expansile mass lesion in the distal spinal cord and conus medullaris which shows heterogeneous signal intensity and enhancement. The mass measures approximately 14 x 9 mm on transverse images and extends over a 37 mm length.  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  The patient may benefit from localized palliative radiotherapy to preserve existing neurologic function. ________________________________  Sheral Apley Tammi Klippel, M.D.

## 2018-12-02 NOTE — Progress Notes (Signed)
Patient ID: Benjamin [name], male   DOB: 1945-07-25, 73 y.o.   MRN: [id number] I will send her stable I have discussed the findings from the neuro conference this morning Patient is already on schedule to start RT for the lower lumbar lesion Continues to have significant back pain Perhaps the radiation will help with this He has no useful motor function lower extremities Continue supportive care

## 2018-12-02 NOTE — Progress Notes (Signed)
   Pre-Radiation Note:  Inpatient nurse name: Tye Maryland, RN  Time Called: [phone]  Inpatient nurse to call [facility]: Yes.    [facility] called to verify transportation: Yes.   Time: 1430  Patient Status:   Pain: Primary symptom:  Type: Pain Laterality: Center Region: Lower backsevere Pain medication given: Vicodin Mobility Orders: ambulatory Treatment Site: distal spine to preserve existing neurologic function Additional Injuries: leptomeningeal disease  Consent:    Is patient able to sign consent: No. Family member called: No. Name/Time: n/a     Spoke with Cathy, RN on [address]. She confirms the patient is stable for treatment. She understands [facility] will pick up the patient at 1400 to be at cancer center for 1430 consent 1500 simulation. Explained we will have the patient for approximately 1.5 hours then return him via [facility]. Explained the patient will have no restrictions. Verbalized intend to begin radiation treatment with patient tomorrow.  Joaquim Lai, RN 12/02/2018,9:54 AM

## 2018-12-02 NOTE — Progress Notes (Signed)
PROGRESS NOTE  Benjamin [name]  DOB: [birth year]  PCP: Hennie Duos, MD BMW:[id number]  DOA: 11/26/2018  LOS: 6 days   Brief narrative: Benjamin [name] is a 73 y.o. male with PMH of non-small cell lung cancer and paroxysmal atrial fibrillation on Eliquis at home. Patient presented to the ED on 11/26/2018 for further evaluation of bilateral lower extremity thoracic paralysis with sensory loss since last May. He started having some symptoms since April, rapidly progressive bilateral lower extremity weakness and sensory changes as well as bowel and bladder incontinence. Patient underwent lumbar decompression surgery. He did not have improvement of symptoms. Patient also has history of lung cancer, he had radiation therapies and treatment at least 6 times for the cancer since 2003 as per patient. Due to persistent symptoms and need for further management he was brought to the hospital by neurosurgery, Dr. Ellene Route. Admitted to medical service with neurology and neurosurgery following.  Subjective: Patient was seen and examined this morning.  Not in distress.  No new symptoms.  Continues to have paraplegia.  No improvement. Waiting for definite plan from neurosurgery.  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 MRI consistent with multilevel spinal cancer. No bony involvement. History of lung cancer. Followed by neurosurgery, may need biopsy. Continue supportive care.  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  Off Lovenox in anticipation of any surgery. MRI brain 9/11 shows no metastatic disease to the brain or cerebral meninges. Waiting for definite plan from neurosurgery.  Noted recommendation from radiation oncology this morning.  Patient may benefit from localized palliative radiotherapy to preserve existing neurologic function.  Neurosurgery to determine surgery versus radiation first.  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 Fluid restrictions 1200 mL a day. Started on salt tablets. Sodium level gradually improving, 129 today.  Continue Lasix and Aldactone. Currently no indication for treatment with hypertonic saline, will continue to closely monitor.  Paroxysmal atrial fibrillation: Currently sinus rhythm. Eliquis on hold for anticipated surgical procedures.  Complete heart block status post pacemaker: Cardiology consulted.Stable.   Chronic diastolic heart failure: euvolumic, stable.  Coronary artery disease: Stable. Currently on atorvastatin and metoprolol.  Mobility: Paraplegic Diet: Cardiac diet DVT prophylaxis: SCDs Code Status:  Code Status: Full Code  Family Communication: Expected Discharge: Pending neurosurgery clearance.  Noted a plan of tumor board meeting today.  Consultants:  Neurosurgery  Neurology, signed off  Antimicrobials: Anti-infectives (From admission, onward)   None     Infusions:    Scheduled Meds: . atorvastatin  20 mg Oral Daily  . clonazePAM  1 mg Oral TID  . feeding supplement (PRO-STAT SUGAR FREE 64)  30 mL Oral BID  . furosemide  40 mg Oral Daily  . gabapentin  100 mg Oral QHS  . ipratropium-albuterol  3 mL Nebulization BID  . metoprolol succinate  25 mg Oral Daily  . sodium chloride  1 g Oral TID WC  . spironolactone  25 mg Oral Daily  . tamsulosin  0.4 mg Oral QPM    PRN meds: acetaminophen, HYDROcodone-acetaminophen, magnesium hydroxide, nitroGLYCERIN   Objective: Vitals:   12/02/18 0345 12/02/18 0820  BP: 127/77 138/88  Pulse: 63 61  Resp: 18 16  Temp: 97.7 F (36.5 C) (!) 97.5 F (36.4 C)  SpO2: 98% 98%    Intake/Output Summary (Last 24 hours) at 12/02/2018 0901 Last data filed at 12/02/2018 0400 Gross per 24 hour  Intake 240 ml  Output 1400 ml  Net -1160 ml  There were no vitals filed for this visit. Weight change:  There is no height or weight on file to calculate BMI.   Physical Exam:  General exam: Appears calm and comfortable.  Lying in bed.  Not in distress Skin: No rashes, lesions or ulcers. HEENT: Atraumatic, normocephalic, supple neck, no obvious bleeding Lungs: Clear to auscultation bilaterally CVS: Regular rate and rhythm, no murmur GI/Abd soft, nontender, nondistended, also present CNS: Alert, awake, oriented x3 Psychiatry: Mood appropriate, judgment and insight clear Extremities: No pedal edema, no calf tenderness  Data Review: I have personally reviewed the laboratory data and studies available.  Recent Labs  Lab 11/26/18 1840 11/27/18 0316 12/01/18 0324  WBC 8.6 7.5 7.4  NEUTROABS 6.9  --  5.1  HGB 14.6 13.8 14.0  HCT 42.6 39.6 40.5  MCV 85.2 84.3 85.8  PLT 219 186 207   Recent Labs  Lab 11/27/18 0316 11/28/18 0330 11/29/18 0314 11/30/18 0320 12/01/18 0324  NA 122* 124* 128* 128* 129*  K 3.9 3.9 4.1 4.2 4.3  CL 89* 92* 94* 94* 96*  CO2 24 22 24 25 25   GLUCOSE 112* 102* 106* 106* 107*  BUN 14 19 20 21  27*  CREATININE 0.68 0.62 0.73 0.76 0.72  CALCIUM 8.5* 8.5* 8.6* 8.6* 8.8*    Benjamin Croak, MD  [facility] 12/02/2018

## 2018-12-02 NOTE — Consult Note (Signed)
Radiation Oncology         [phone]) [phone] ________________________________  Inpatient Consultation  Name: Benjamin [name] MRN: [id number]  Date of Service: 12/02/2018 DOB: 08/28/1945  JG:GEZMOQHUT, Noah Delaine, MD  Hayden Pedro,*   REFERRING PHYSICIAN: Hayden Pedro,*  DIAGNOSIS: The encounter diagnosis was S/P myelogram.    ICD-10-CM   1. S/P myelogram  Z98.890 CT THORACIC SPINE W CONTRAST    CT THORACIC SPINE W CONTRAST    HISTORY OF PRESENT ILLNESS: Benjamin [name] is a 73 y.o. male seen at the request of Dr. Ellene Route for a newly noted lesion in the conus. He has previously undergone resection of the left upper lobe in 2003, as well as a resection of the right upper lobe in 2005,and does have some chronic issues with breathing as a result of lower lung volume. He completed SBRT in July 2017 to the right lower lobe.  Since February 2018, we've been watching a8 mm ill definite density in the superior segment of the right lower lobe. Short interval scanon5/11/18 revealed stability of the 52mm nodule in the superior aspect of the right lower lobe. There was also ground glass attenuation in the RLL with a solid component and the component was previously 1.1 cm, and in May, was2.1 cm. 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 Again we discussed options for short interval scan and he comes today to review this from 11/08/16. Stability of theright lower lobe 6 mm nodule, and right lower lobe consolidation was stable and present, and the prior noted ground glass nodule was less dense in the right middle lobe. Along the left lung, persistent consolidation was stable as was the post surgical change in the left upper lobe. He does appear to have a fracture around right anterior/lateral rib 6 from my read, though this was not listed in the report. This was a new finding since his scan on 07/28/16. He continued in surveillance until addition scans revealed PET positive changes at the prior  staple line in the left lung and in the RLL. He received SBRT to the RLL, and to the staple line. He was admitted in May 2020 for surgical decompression at L4-5 as a result of high grade stenosis. He needed significant postoperative care and rehabilitation and was in a skilled facility following his admission. He had an earlier surveillance CT in April than planned for and this showed stability when we reviewed this and spoke by phone in June. He was due for his next CT of the chest in October. Unfortunately he has had progressive neurologic decline in the past few months and was back in to see Dr. 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 His case was discussed in conference and he was felt to be a good candidate for palliative radiotherapy. He was contacted to discuss this as he is an inpatient at [facility].   PREVIOUS RADIATION THERAPY: Yes   06/27/2017-07/06/2017 SBRT Treatment: The staple line lesion in the left lungwas treated to 50Gy in 80fractions of 10Gy. The RLL lesion was treated to 54 Gy in 3 fractions of 18 Gy.   09/20/15-09/27/15 SBRT Treatment:  Right lower lobe was treated to 54 Gy in 3 fractions of 18 Gy.  06/03/07- 07/05/07:  62.5 Gy in 25 fractions of 2.5 Gy to an inferior left upper lobe target  PAST MEDICAL HISTORY:  Past  Medical History:  Diagnosis Date   Adenopathy    RIGHT ADRENAL   Anxiety    Arthritis    KNEES/HANDS   CAD (coronary artery disease)    a. Prior known CTO of LCX with recanalizationby cath 2008;  b. 09/2012 NSTEMI/Cath/PCI: LM nl, LAD 66m, D1 50p, LCX 100p (2.75x16 Promus Premier DES), 80-51m, OM1 30, RCA 83m (3.0x16 Promus Premier DES), EF 50-55%.   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 s/p resection and XRT.   PAF (paroxysmal atrial fibrillation) ([place])    a. 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   Uses walker       PAST SURGICAL HISTORY: Past Surgical History:  Procedure Laterality Date   ADRENALEXTOMY  02/2000   CARDIAC CATHETERIZATION  2008   LAD 40, D1 OK, D2 50, CFX 90, RCA OK, EF 60%; Consider PCI PRN but the stenosis is quite complex and we would end up jailing several large posterolateral branches     HERNIA REPAIR  05/27/2018   INGUINAL HERNIA REPAIR Right 05/27/2018   Procedure: INCARCERATED RIGHT INGUINAL HERNIA REPAIR;  Surgeon: Erroll Luna, MD;  Location: [place];  Service: General;  Laterality: Right;   LEFT HEART CATHETERIZATION WITH CORONARY ANGIOGRAM N/A 10/17/2012   Procedure: LEFT HEART CATHETERIZATION WITH CORONARY ANGIOGRAM;  Surgeon: Sherren Mocha, MD;  Location: [facility] CATH LAB;  Service: Cardiovascular;  Laterality: N/A;   LOBECTOMY LEFT UPPER LOBE  08/15/2001   LUMBAR LAMINECTOMY/DECOMPRESSION MICRODISCECTOMY N/A 07/25/2018   Procedure: Lumbar Four-Five Laminectomy;  Surgeon: Kristeen Miss, MD;  Location: [place];  Service: Neurosurgery;  Laterality: N/A;  Lumbar Four-Five Laminectomy   PACEMAKER IMPLANT N/A 08/29/2016   Procedure: Pacemaker Implant;  Surgeon: Evans Lance, MD;  Location: [place] CV LAB;  Service: Cardiovascular;  Laterality: N/A;   Right VATS, mini thoracotomy, wedge resection of right upper  01/18/2004    FAMILY HISTORY:  Family History  Problem Relation Age of Onset   Heart attack Father    Cancer Mother        lung   Cancer Cousin        breast    SOCIAL HISTORY:  Social History   Socioeconomic History   Marital status: Divorced    Spouse  name: Not on file   Number of children: Not on file   Years of education: Not on file   Highest education level: Not on file  Occupational History   Occupation: Retired    Fish farm manager: [employer] resource strain: Not on file   Food insecurity    Worry: Not on file    Inability: Not on file   Transportation needs    Medical: Not on file    Non-medical: Not on file  Tobacco Use   Smoking status: Former Smoker    Packs/day: 1.00    Years: 40.00    Pack years: 40.00    Types: Cigarettes    Quit date: 08/15/2001    Years since quitting: 17.3   Smokeless tobacco:  Never Used  Substance and Sexual Activity   Alcohol use: No   Drug use: No   Sexual activity: Not Currently  Lifestyle   Physical activity    Days per week: Not on file    Minutes per session: Not on file   Stress: Not on file  Relationships   Social connections    Talks on phone: Not on file    Gets together: Not on file    Attends religious service: Not on file    Active member of club or organization: Not on file    Attends meetings of clubs or organizations: Not on file    Relationship status: Not on file   Intimate partner violence    Fear of current or ex partner: Not on file    Emotionally abused: Not on file    Physically abused: Not on file    Forced sexual activity: Not on file  Other Topics Concern   Not on file  Social History Narrative   Not on file  The patient is single. He and his girlfriend of many years broke up suddenly and this was quite devastating for him a little over 6 months ago.  ALLERGIES: Codeine  MEDICATIONS:  Current Facility-Administered Medications  Medication Dose Route Frequency Provider Last Rate Last Dose   acetaminophen (TYLENOL) tablet 650 mg  650 mg Oral Q6H PRN Kristeen Miss, MD   650 mg at 12/02/18 0433   atorvastatin (LIPITOR) tablet 20 mg  20 mg Oral Daily Kristeen Miss, MD   20 mg at 12/02/18 0859   clonazePAM  (KLONOPIN) tablet 1 mg  1 mg Oral TID Kristeen Miss, MD   1 mg at 12/02/18 0859   feeding supplement (PRO-STAT SUGAR FREE 64) liquid 30 mL  30 mL Oral BID Jani Gravel, MD   30 mL at 12/02/18 0859   furosemide (LASIX) tablet 40 mg  40 mg Oral Daily Dahal, Marlowe Aschoff, MD   40 mg at 12/02/18 0859   gabapentin (NEURONTIN) capsule 100 mg  100 mg Oral Antoine Poche, MD   100 mg at 12/01/18 2137   HYDROcodone-acetaminophen (NORCO/VICODIN) 5-325 MG per tablet 1-2 tablet  1-2 tablet Oral Q4H PRN Kristeen Miss, MD   2 tablet at 11/29/18 1133   ipratropium-albuterol (DUONEB) 0.5-2.5 (3) MG/3ML nebulizer solution 3 mL  3 mL Nebulization BID Kristeen Miss, MD   3 mL at 12/02/18 0933   magnesium hydroxide (MILK OF MAGNESIA) suspension 30 mL  30 mL Oral Daily PRN Kristeen Miss, MD       metoprolol succinate (TOPROL-XL) 24 hr tablet 25 mg  25 mg Oral Daily Barb Merino, MD   25 mg at 12/02/18 0859   nitroGLYCERIN (NITROSTAT) SL tablet 0.4 mg  0.4 mg Sublingual Q5 Min x 3 PRN Kristeen Miss, MD       sodium chloride tablet 1 g  1 g Oral TID WC Barb Merino, MD   1 g at 12/02/18 0859   spironolactone (ALDACTONE) tablet 25 mg  25 mg Oral Daily Kristeen Miss, MD   25 mg at 12/01/18 0902   tamsulosin (FLOMAX) capsule 0.4 mg  0.4 mg Oral QPM Kristeen Miss, MD   0.4 mg at 12/01/18 1733   Facility-Administered Medications Ordered in Other Encounters  Medication Dose Route Frequency Provider Last Rate Last Dose   HYDROcodone-acetaminophen (NORCO/VICODIN) 5-325 MG per tablet 1-2 tablet  1-2 tablet Oral Q4H PRN Kristeen Miss, MD   1 tablet at 11/26/18 1109   lidocaine (  PF) (XYLOCAINE) 1 % injection 2 mL  2 mL Intradermal Once Kristeen Miss, MD       ondansetron [facility]) injection 4 mg  4 mg Intravenous Q6H PRN Kristeen Miss, MD        REVIEW OF SYSTEMS:  On review of systems, the patient reports that he is doing okay but is quite concerned about his functional status. He reports pain when trying to lay flat  in his low back as well as the loss of control of his bowels and bladder for several months now. He is unable to stand on his own. He denies any chest pain, shortness of breath, cough, fevers, chills, night sweats, unintended weight changes. H denies abdominal pain, nausea or vomiting. He reports he is basically numb from the waist down. He denies any new musculoskeletal or joint aches or pains. A complete review of systems is obtained and is otherwise negative.    PHYSICAL EXAM:  Wt Readings from Last 3 Encounters:  11/26/18 193 lb (87.5 kg)  11/21/18 194 lb 12.8 oz (88.4 kg)  11/18/18 198 lb 6.4 oz (90 kg)   Unable to assess due to encounter type   KPS = 40  100 - Normal; no complaints; no evidence of disease. 90   - Able to carry on normal activity; minor signs or symptoms of disease. 80   - Normal activity with effort; some signs or symptoms of disease. 26   - Cares for self; unable to carry on normal activity or to do active work. 60   - Requires occasional assistance, but is able to care for most of his personal needs. 50   - Requires considerable assistance and frequent medical care. 108   - Disabled; requires special care and assistance. 29   - Severely disabled; hospital admission is indicated although death not imminent. 65   - Very sick; hospital admission necessary; active supportive treatment necessary. 10   - Moribund; fatal processes progressing rapidly. 0     - Dead  Karnofsky DA, Abelmann [place], Craver LS and Burchenal [facility] [phone]) The use of the nitrogen mustards in the palliative treatment of carcinoma: with particular reference to bronchogenic carcinoma Cancer 1 634-56  LABORATORY DATA:  Lab Results  Component Value Date   WBC 7.4 12/01/2018   HGB 14.0 12/01/2018   HCT 40.5 12/01/2018   MCV 85.8 12/01/2018   PLT 207 12/01/2018   Lab Results  Component Value Date   NA 129 (L) 12/01/2018   K 4.3 12/01/2018   CL 96 (L) 12/01/2018   CO2 25 12/01/2018   Lab Results   Component Value Date   ALT 13 11/27/2018   AST 14 (L) 11/27/2018   ALKPHOS 88 11/27/2018   BILITOT 0.8 11/27/2018     RADIOGRAPHY: Ct Thoracic Spine W Contrast  Result Date: 11/26/2018 CLINICAL DATA:  72 year old male with grade 2 lower lumbar spondylolisthesis status post posterior decompression of the lumbar spine in May this year. Evidence of solid ankylosis at the L4-L5 spondylolisthesis level, however worsening weakness and neurologic symptoms since the time of surgery. History of lung cancer with multiple recurrences. FLUOROSCOPY TIME:  1 minutes 12 seconds PROCEDURE: LUMBAR PUNCTURE FOR THORACIC AND LUMBAR MYELOGRAM Lumbar puncture and intrathecal contrast administration were performed by Dr. Kristeen Miss who will separately report for the portion of the procedure. I personally supervised acquisition of the myelogram images. COMPARISON:  Postoperative lumbar spine CT 09/26/2018. Preoperative CT 07/10/2018. chest CT 07/10/2018. TECHNIQUE: Contiguous axial images were obtained  through the Thoracic and Lumbar spine after the intrathecal infusion of infusion. Coronal and sagittal reconstructions were obtained of the axial image sets. FINDINGS: MYELOGRAM FINDINGS: Lumbar puncture was performed at the upper lumbar level. On the initial postcontrast images there is 5-6 millimeter round filling defect occupying the left half of the thecal sac at the L3 vertebral body level. This became more difficult to see over the course of the myelographic images and will be reported in the lumbar CT below. Stable spondylolisthesis of L4 on L5. Stable vertebral height and alignment elsewhere including mild retrolisthesis of L1 on L2. Good lumbar intrathecal sac opacification, with no lumbar spinal stenosis from L2 to the sacrum. There is a defect on the ventral thecal sac at L1-L2 related to chronic disc osteophyte complex, appearing similar to that expected based on the July CT. A 2nd lateral view in mild Trendelenburg  demonstrates contrast flow beyond this level, and normal appearing thecal sac patency at T12-L1. See additional post myelogram CT findings of the thoracic spine below. CT THORACIC MYELOGRAM FINDINGS: Decision was made following the acquisition of plain lumbar myelogram images to obtain CT post myelogram images also of the thoracic spine. Thoracic spine images were then obtained in a delayed fashion at 1241 hours (versus CT acquisition at 1133 hours of the lumbar spine. Only trace myelographic contrast is present in the thoracic spine, and seems to be mostly subdural space contrast. However, on axial series 7 images 68 through 110 there does appear to be a small amount of intrathecal thoracic contrast delineating a nonenlarged thoracic spinal cord from the T7 through the T10 level. Additionally the conus from T11-T12 continues to have an unusual post myelographic appearance which seems unlikely just to the mixed contrast injection (over an hour delayed from the lumbar images). And furthermore there remains a nodular filling defect in the right thecal sac at T12 (in addition to the multifocal cauda equina nodularity described below). There is no evidence of degenerative thoracic spinal stenosis. No acute or suspicious osseous lesion is identified in the thoracic spine. Persistent masslike opacity at the left hilum and in the visible posterior left upper lung pleural space. Partially visible persistent right upper lobe curvilinear confluent opacity on series 6, image 40. No new or increased mediastinal lymph nodes are identified. CT LUMBAR MYELOGRAM FINDINGS: Stable visible abdominal viscera including Calcified aortic atherosclerosis. And right adrenal region surgical clips. Stable posterior paraspinal soft tissues. No acute osseous abnormality identified. Stable posterior postoperative changes at L3-L4 and L4-L5. Solid interbody ankylosis redemonstrated at L4-L5. Intact visible sacrum and SI joints. T12-L1: The tip of  the conus is at this level, above which there is only trace subdural intraspinal contrast. A filling defect in the right thecal sac on series 5, image 41 was initially felt probably related to the mixed injection surrounding the exiting right T12 nerve - although that seems less likely given the persistence on the delayed thoracic CT described above. No spinal stenosis at this level, minor disc bulging and endplate spurring appear stable since July. L1-L2: Chronic mild retrolisthesis with disc space loss and circumferential disc osteophyte complex. Mild spinal stenosis and mild to moderate left lateral recess stenosis (series 5, image 57). Moderate bilateral L1 foraminal stenosis. This level appears stable since July. L2-L3: Circumferential disc bulge and mild to moderate posterior element hypertrophy. No spinal stenosis. Borderline to mild left lateral recess stenosis. Mild to moderate left and borderline to mild right L2 foraminal stenosis. L3: Posterior to the L3 vertebral  body to the left of midline there is a small 4-5 millimeter low-density filling defect associated with the cauda equina (series 5, image 80 and sagittal series 8, image 35), around which there is mildly hyperdense contrast, which appears similar to the epidural/subdural contrast also in this region (see bilateral exiting L3 nerve roots). Just cephalad of this lesion and near the midline there is a subtle 2 millimeter area of thickening of the cauda equina (series 5, image 76). L3-L4: Posterior decompression of this level. Circumferential disc osteophyte complex and residual posterior element hypertrophy with widely patent thecal sac. Borderline to mild bilateral L3 foraminal stenosis. L4-L5: Chronic grade 2 spondylolisthesis with interbody ankylosis and posterior decompression. There is mildly irregular thickening of multiple cauda equina nerve roots also at this level (series 5, image 95) most of which are not displaced outwardly along the  walls of the thecal sac. No spinal stenosis. No lateral recess stenosis. Moderate to severe bilateral L4 foraminal stenosis which is probably greater on the right. L5-S1:  Mild disc bulge and endplate spurring without stenosis. IMPRESSION: 1. 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 Mild chronic appearing lumbar spinal stenosis at L1-L2 is likely stable since that time, with decompressed L3-L4 and L4-L5 levels and no other significant lumbar spinal stenosis. 2. Non-specific thickening and nodularity of the cauda equina at the T12, L3, and L4 levels, with a superimposed unusual and bulbous appearance of the conus medullaris, which though initially felt related to a mixed contrast injection at the time of lumbar CT images (1133 hours) remained unchanged on delayed thoracic spine images at 1241 hours. And with no interval cephalad migration of intrathecal contrast, despite absent CT evidence of any thoracic spinal stenosis. 3. This constellation of clinical and imaging findings therefore raises the possibility of intrathecal metastatic disease with involvement of the conus and cauda equina. These studies were discussed by telephone with Dr. Kristeen Miss on 11/26/2018 at 13:20. Electronically Signed   By: Genevie Ann M.D.   On: 11/26/2018 13:25   Ct Lumbar Spine W Contrast  Result Date: 11/26/2018 CLINICAL DATA:  73 year old male with grade 2 lower lumbar spondylolisthesis status post posterior decompression of the lumbar spine in May this year. Evidence of solid ankylosis at the L4-L5 spondylolisthesis level, however worsening weakness and neurologic symptoms since the time of surgery. History of lung cancer with multiple recurrences. FLUOROSCOPY TIME:  1 minutes 12 seconds PROCEDURE: LUMBAR PUNCTURE FOR THORACIC AND LUMBAR MYELOGRAM Lumbar puncture and intrathecal contrast administration were performed by Dr. Kristeen Miss who will separately  report for the portion of the procedure. I personally supervised acquisition of the myelogram images. COMPARISON:  Postoperative lumbar spine CT 09/26/2018. Preoperative CT 07/10/2018. chest CT 07/10/2018. TECHNIQUE: Contiguous axial images were obtained through the Thoracic and Lumbar spine after the intrathecal infusion of infusion. Coronal and sagittal reconstructions were obtained of the axial image sets. FINDINGS: MYELOGRAM FINDINGS: Lumbar puncture was performed at the upper lumbar level. On the initial postcontrast images there is 5-6 millimeter round filling defect occupying the left half of the thecal sac at the L3 vertebral body level. This became more difficult to see over the course of the myelographic images and will be reported in the lumbar CT below. Stable spondylolisthesis of L4 on L5. Stable vertebral height and alignment elsewhere including mild retrolisthesis of L1 on L2. Good lumbar intrathecal sac opacification, with no lumbar spinal stenosis from L2 to the sacrum. There  is a defect on the ventral thecal sac at L1-L2 related to chronic disc osteophyte complex, appearing similar to that expected based on the July CT. A 2nd lateral view in mild Trendelenburg demonstrates contrast flow beyond this level, and normal appearing thecal sac patency at T12-L1. See additional post myelogram CT findings of the thoracic spine below. CT THORACIC MYELOGRAM FINDINGS: Decision was made following the acquisition of plain lumbar myelogram images to obtain CT post myelogram images also of the thoracic spine. Thoracic spine images were then obtained in a delayed fashion at 1241 hours (versus CT acquisition at 1133 hours of the lumbar spine. Only trace myelographic contrast is present in the thoracic spine, and seems to be mostly subdural space contrast. However, on axial series 7 images 68 through 110 there does appear to be a small amount of intrathecal thoracic contrast delineating a nonenlarged thoracic spinal  cord from the T7 through the T10 level. Additionally the conus from T11-T12 continues to have an unusual post myelographic appearance which seems unlikely just to the mixed contrast injection (over an hour delayed from the lumbar images). And furthermore there remains a nodular filling defect in the right thecal sac at T12 (in addition to the multifocal cauda equina nodularity described below). There is no evidence of degenerative thoracic spinal stenosis. No acute or suspicious osseous lesion is identified in the thoracic spine. Persistent masslike opacity at the left hilum and in the visible posterior left upper lung pleural space. Partially visible persistent right upper lobe curvilinear confluent opacity on series 6, image 40. No new or increased mediastinal lymph nodes are identified. CT LUMBAR MYELOGRAM FINDINGS: Stable visible abdominal viscera including Calcified aortic atherosclerosis. And right adrenal region surgical clips. Stable posterior paraspinal soft tissues. No acute osseous abnormality identified. Stable posterior postoperative changes at L3-L4 and L4-L5. Solid interbody ankylosis redemonstrated at L4-L5. Intact visible sacrum and SI joints. T12-L1: The tip of the conus is at this level, above which there is only trace subdural intraspinal contrast. A filling defect in the right thecal sac on series 5, image 41 was initially felt probably related to the mixed injection surrounding the exiting right T12 nerve - although that seems less likely given the persistence on the delayed thoracic CT described above. No spinal stenosis at this level, minor disc bulging and endplate spurring appear stable since July. L1-L2: Chronic mild retrolisthesis with disc space loss and circumferential disc osteophyte complex. Mild spinal stenosis and mild to moderate left lateral recess stenosis (series 5, image 57). Moderate bilateral L1 foraminal stenosis. This level appears stable since July. L2-L3: Circumferential  disc bulge and mild to moderate posterior element hypertrophy. No spinal stenosis. Borderline to mild left lateral recess stenosis. Mild to moderate left and borderline to mild right L2 foraminal stenosis. L3: Posterior to the L3 vertebral body to the left of midline there is a small 4-5 millimeter low-density filling defect associated with the cauda equina (series 5, image 80 and sagittal series 8, image 35), around which there is mildly hyperdense contrast, which appears similar to the epidural/subdural contrast also in this region (see bilateral exiting L3 nerve roots). Just cephalad of this lesion and near the midline there is a subtle 2 millimeter area of thickening of the cauda equina (series 5, image 76). L3-L4: Posterior decompression of this level. Circumferential disc osteophyte complex and residual posterior element hypertrophy with widely patent thecal sac. Borderline to mild bilateral L3 foraminal stenosis. L4-L5: Chronic grade 2 spondylolisthesis with interbody ankylosis and posterior decompression.  There is mildly irregular thickening of multiple cauda equina nerve roots also at this level (series 5, image 95) most of which are not displaced outwardly along the walls of the thecal sac. No spinal stenosis. No lateral recess stenosis. Moderate to severe bilateral L4 foraminal stenosis which is probably greater on the right. L5-S1:  Mild disc bulge and endplate spurring without stenosis. IMPRESSION: 1. 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 Mild chronic appearing lumbar spinal stenosis at L1-L2 is likely stable since that time, with decompressed L3-L4 and L4-L5 levels and no other significant lumbar spinal stenosis. 2. Non-specific thickening and nodularity of the cauda equina at the T12, L3, and L4 levels, with a superimposed unusual and bulbous appearance of the conus medullaris, which though initially felt related to a  mixed contrast injection at the time of lumbar CT images (1133 hours) remained unchanged on delayed thoracic spine images at 1241 hours. And with no interval cephalad migration of intrathecal contrast, despite absent CT evidence of any thoracic spinal stenosis. 3. This constellation of clinical and imaging findings therefore raises the possibility of intrathecal metastatic disease with involvement of the conus and cauda equina. These studies were discussed by telephone with Dr. Kristeen Miss on 11/26/2018 at 13:20. Electronically Signed   By: Genevie Ann M.D.   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 Brain staging. The patient has an MRI compatible cardiac pacemaker. EXAM: MRI HEAD WITHOUT AND WITH CONTRAST TECHNIQUE: Multiplanar, multiecho pulse sequences of the brain and surrounding structures were obtained without and with intravenous contrast. CONTRAST:  70mL GADAVIST GADOBUTROL 1 MMOL/ML IV SOLN COMPARISON:  Thoracic and lumbar MRI yesterday. FINDINGS: Brain: No restricted diffusion to suggest acute infarction. No midline shift, mass effect, ventriculomegaly, extra-axial collection or acute intracranial hemorrhage. Cervicomedullary junction and pituitary are within normal limits. Prior to contrast gray and white matter signal is largely normal for age throughout the brain. 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 But otherwise only minimal nonspecific white matter T2 and FLAIR hyperintensity. Following contrast there is no abnormal intracranial enhancement identified. No leptomeningeal or dural thickening identified inside the skull. Also the visible cervical spinal cord to C3-C4 seems to remain normal. Vascular: The distal right vertebral artery flow void is lost on  series 10, image 3. The left vertebral appears mildly dominant. The other Major intracranial vascular flow voids are preserved. The major dural venous sinuses are enhancing and appear to be patent. Skull and upper cervical spine: Visualized bone marrow signal is within normal limits. C3-C4 disc and endplate degeneration. Sinuses/Orbits: Negative orbits. Trace paranasal sinus mucosal thickening. Other: Mastoids are clear. Visible internal auditory structures appear normal. Right lateral convexity scalp lipoma incidentally noted on series 17, image 14. However, there is a nonspecific 12 millimeter heterogeneously enhancing scalp lesion located near the right temple on series 19, image 18 (note abnormal diffusion on series 7, image 56). IMPRESSION: 1. No metastatic disease to the brain or cerebral meninges is identified. 2. Possible 12 mm scalp soft tissue metastasis near the right temple (series 19, image 18). Alternatively this might be an inflamed proteinaceous dermal lesion. 3. No metastatic disease identified in the visible upper cervical spine. Electronically Signed   By: Herminio Heads.D.  On: 11/29/2018 16:05   Mr Thoracic Spine W Wo Contrast  Result Date: 11/28/2018 CLINICAL DATA:  Bilateral leg weakness. Abnormal CT myelogram 11/26/2018. History of lung cancer. The patient has a pacemaker which is MRI compatible. EXAM: MRI THORACIC WITHOUT AND WITH CONTRAST TECHNIQUE: Multiplanar and multiecho pulse sequences of the thoracic spine were obtained without and with intravenous contrast. CONTRAST:  89mL GADAVIST GADOBUTROL 1 MMOL/ML IV SOLN COMPARISON:  CT myelogram 11/26/2018 FINDINGS: MRI THORACIC SPINE FINDINGS Alignment:  Normal Vertebrae: Fatty bone marrow changes are present T3 through T7 compatible prior chest radiation for left-sided lung cancer. No thoracic fracture or metastatic disease to the spine. Cord: Expansile mass lesion in the conus medullaris at the T11 and T12 level. The mass measures 14 x 19 x  37 mm and appears intramedullary. There is significant cord edema above the mass. Cord edema continues into the mid and upper thoracic cord. Postcontrast images reveal multiple enhancing masses along the surface of the cord at T1, T2, T3, T5, and T6. Likely several other smaller lesions are also present. Enhancement extends into the left T3 neural foramina. Paraspinal and other soft tissues: Left upper lobe mass. No significant pleural effusion. Right upper lobe abnormality possibly metastatic disease. Disc levels: 2 mm anterolisthesis C7-T1 due to facet degeneration. Mild thoracic disc degeneration. Negative for disc protrusion. IMPRESSION: Enhancing mass lesion in the conus medullaris. Multiple small enhancing masses along the surface of the cord in the upper and midthoracic spine. 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 Recommend MRI of the brain without with contrast evaluate for metastatic disease. No evidence of bony metastatic disease These results were called by telephone at the time of interpretation on 11/28/2018 at 2:35 pm to provider [facility] , who verbally acknowledged these results. Electronically Signed   By: Franchot Gallo M.D.   On: 11/28/2018 14:36   Mr Lumbar Spine W Wo Contrast  Result Date: 11/28/2018 CLINICAL DATA:  Back pain. 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 History of lung cancer. Lumbar laminectomy 07/25/2018 L4-5 level. Abnormal myelogram 11/26/2018 EXAM: MRI LUMBAR SPINE WITHOUT AND WITH CONTRAST TECHNIQUE: Multiplanar and multiecho pulse sequences of the lumbar spine were obtained without and with intravenous contrast. CONTRAST:  51mL GADAVIST GADOBUTROL 1 MMOL/ML IV SOLN COMPARISON:  Thoracic and lumbar myelogram 11/26/2018 FINDINGS: Segmentation:  Normal Alignment:  Mild retrolisthesis L1-2, 4 mm. 16 mm anterolisthesis L4-5 with severe disc degeneration at this level and bilateral pars defects of L4  Vertebrae: Negative for vertebral fracture or mass lesion. Small Schmorl's node with surrounding bone marrow edema at L1-2. Conus medullaris and cauda equina: Conus medullaris appears to terminate at T12-L1. There is and expansile mass lesion in the distal spinal cord and conus medullaris which shows heterogeneous signal intensity and enhancement. The mass measures approximately 14 x 9 mm on transverse images and extends over a 37 mm length. Small areas of hemorrhage are present within the mass. This accounts for the intradural mass identified on myelogram. There is edema in the cord above the mass. Paraspinal and other soft tissues: Negative for retroperitoneal mass or adenopathy. Urinary bladder is distended with Foley catheter. Disc levels: T12-L1: Mild facet degeneration.  Negative for stenosis L1-2: 4 mm retrolisthesis with disc and facet degeneration. Mild spinal stenosis and mild subarticular stenosis bilaterally L2-3: Diffuse bulging of the disc. Shallow foraminal disc protrusion bilaterally. Mild facet degeneration. Mild spinal stenosis and mild subarticular stenosis bilaterally L3-4: Diffuse disc bulging and mild facet degeneration. Negative  for stenosis L4-5: 16 mm anterolisthesis with bilateral pars defects. Moderate to severe foraminal encroachment bilaterally with impingement of the L4 nerve root bilaterally. L5-S1: Small central disc protrusion. Mild flattening of the right S1 nerve root. IMPRESSION: Enhancing mass involving the distal spinal cord and conus medullaris. This caused myelographic near complete block 2 days ago. 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 Review of the thoracic MRI demonstrates multiple enhancing masses along the surface of the cord in the thoracic spine, therefore metastatic disease is felt to be most likely. Negative for bony metastatic disease lumbar spine Grade 2 slip L4-5 due to bilateral pars defects of L4 resulting and moderate to severe  foraminal encroachment bilaterally. These results were called by telephone at the time of interpretation on 11/28/2018 at 2:26 pm to provider [facility] , who verbally acknowledged these results. Electronically Signed   By: Franchot Gallo M.D.   On: 11/28/2018 14:28   Dg Myelogram Lumbar  Result Date: 11/26/2018 CLINICAL DATA:  73 year old male with grade 2 lower lumbar spondylolisthesis status post posterior decompression of the lumbar spine in May this year. Evidence of solid ankylosis at the L4-L5 spondylolisthesis level, however worsening weakness and neurologic symptoms since the time of surgery. History of lung cancer with multiple recurrences. FLUOROSCOPY TIME:  1 minutes 12 seconds PROCEDURE: LUMBAR PUNCTURE FOR THORACIC AND LUMBAR MYELOGRAM Lumbar puncture and intrathecal contrast administration were performed by Dr. Kristeen Miss who will separately report for the portion of the procedure. I personally supervised acquisition of the myelogram images. COMPARISON:  Postoperative lumbar spine CT 09/26/2018. Preoperative CT 07/10/2018. chest CT 07/10/2018. TECHNIQUE: Contiguous axial images were obtained through the Thoracic and Lumbar spine after the intrathecal infusion of infusion. Coronal and sagittal reconstructions were obtained of the axial image sets. FINDINGS: MYELOGRAM FINDINGS: Lumbar puncture was performed at the upper lumbar level. On the initial postcontrast images there is 5-6 millimeter round filling defect occupying the left half of the thecal sac at the L3 vertebral body level. This became more difficult to see over the course of the myelographic images and will be reported in the lumbar CT below. Stable spondylolisthesis of L4 on L5. Stable vertebral height and alignment elsewhere including mild retrolisthesis of L1 on L2. Good lumbar intrathecal sac opacification, with no lumbar spinal stenosis from L2 to the sacrum. There is a defect on the ventral thecal sac at L1-L2 related to chronic  disc osteophyte complex, appearing similar to that expected based on the July CT. A 2nd lateral view in mild Trendelenburg demonstrates contrast flow beyond this level, and normal appearing thecal sac patency at T12-L1. See additional post myelogram CT findings of the thoracic spine below. CT THORACIC MYELOGRAM FINDINGS: Decision was made following the acquisition of plain lumbar myelogram images to obtain CT post myelogram images also of the thoracic spine. Thoracic spine images were then obtained in a delayed fashion at 1241 hours (versus CT acquisition at 1133 hours of the lumbar spine. Only trace myelographic contrast is present in the thoracic spine, and seems to be mostly subdural space contrast. However, on axial series 7 images 68 through 110 there does appear to be a small amount of intrathecal thoracic contrast delineating a nonenlarged thoracic spinal cord from the T7 through the T10 level. Additionally the conus from T11-T12 continues to have an unusual post myelographic appearance which seems unlikely just to the mixed contrast injection (over an hour delayed from the lumbar images). And furthermore there remains a nodular filling defect in the  right thecal sac at T12 (in addition to the multifocal cauda equina nodularity described below). There is no evidence of degenerative thoracic spinal stenosis. No acute or suspicious osseous lesion is identified in the thoracic spine. Persistent masslike opacity at the left hilum and in the visible posterior left upper lung pleural space. Partially visible persistent right upper lobe curvilinear confluent opacity on series 6, image 40. No new or increased mediastinal lymph nodes are identified. CT LUMBAR MYELOGRAM FINDINGS: Stable visible abdominal viscera including Calcified aortic atherosclerosis. And right adrenal region surgical clips. Stable posterior paraspinal soft tissues. No acute osseous abnormality identified. Stable posterior postoperative changes at  L3-L4 and L4-L5. Solid interbody ankylosis redemonstrated at L4-L5. Intact visible sacrum and SI joints. T12-L1: The tip of the conus is at this level, above which there is only trace subdural intraspinal contrast. A filling defect in the right thecal sac on series 5, image 41 was initially felt probably related to the mixed injection surrounding the exiting right T12 nerve - although that seems less likely given the persistence on the delayed thoracic CT described above. No spinal stenosis at this level, minor disc bulging and endplate spurring appear stable since July. L1-L2: Chronic mild retrolisthesis with disc space loss and circumferential disc osteophyte complex. Mild spinal stenosis and mild to moderate left lateral recess stenosis (series 5, image 57). Moderate bilateral L1 foraminal stenosis. This level appears stable since July. L2-L3: Circumferential disc bulge and mild to moderate posterior element hypertrophy. No spinal stenosis. Borderline to mild left lateral recess stenosis. Mild to moderate left and borderline to mild right L2 foraminal stenosis. L3: Posterior to the L3 vertebral body to the left of midline there is a small 4-5 millimeter low-density filling defect associated with the cauda equina (series 5, image 80 and sagittal series 8, image 35), around which there is mildly hyperdense contrast, which appears similar to the epidural/subdural contrast also in this region (see bilateral exiting L3 nerve roots). Just cephalad of this lesion and near the midline there is a subtle 2 millimeter area of thickening of the cauda equina (series 5, image 76). L3-L4: Posterior decompression of this level. Circumferential disc osteophyte complex and residual posterior element hypertrophy with widely patent thecal sac. Borderline to mild bilateral L3 foraminal stenosis. L4-L5: Chronic grade 2 spondylolisthesis with interbody ankylosis and posterior decompression. There is mildly irregular thickening of  multiple cauda equina nerve roots also at this level (series 5, image 95) most of which are not displaced outwardly along the walls of the thecal sac. No spinal stenosis. No lateral recess stenosis. Moderate to severe bilateral L4 foraminal stenosis which is probably greater on the right. L5-S1:  Mild disc bulge and endplate spurring without stenosis. IMPRESSION: 1. 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 Mild chronic appearing lumbar spinal stenosis at L1-L2 is likely stable since that time, with decompressed L3-L4 and L4-L5 levels and no other significant lumbar spinal stenosis. 2. Non-specific thickening and nodularity of the cauda equina at the T12, L3, and L4 levels, with a superimposed unusual and bulbous appearance of the conus medullaris, which though initially felt related to a mixed contrast injection at the time of lumbar CT images (1133 hours) remained unchanged on delayed thoracic spine images at 1241 hours. And with no interval cephalad migration of intrathecal contrast, despite absent CT evidence of any thoracic spinal stenosis. 3. This constellation of clinical and imaging findings therefore raises the possibility of intrathecal  metastatic disease with involvement of the conus and cauda equina. These studies were discussed by telephone with Dr. Kristeen Miss on 11/26/2018 at 13:20. Electronically Signed   By: Genevie Ann M.D.   On: 11/26/2018 13:25   Vas [place] Lower Extremity Venous (dvt)  Result Date: 11/28/2018  Lower Venous Study Indications: Edema.  Risk Factors: Cancer metastatic. Comparison Study: 05/17/24 bilateral lev negative Performing Technologist: Toma Copier RVS  Examination Guidelines: A complete evaluation includes B-mode imaging, spectral Doppler, color Doppler, and power Doppler as needed of all accessible portions of each vessel. Bilateral testing is considered an integral part of a complete  examination. Limited examinations for reoccurring indications may be performed as noted.  +---------+---------------+---------+-----------+----------+-------------------+  RIGHT     Compressibility Phasicity Spontaneity Properties Thrombus Aging       +---------+---------------+---------+-----------+----------+-------------------+  CFV       Full            Yes       Yes                                         +---------+---------------+---------+-----------+----------+-------------------+  SFJ       Full                                                                  +---------+---------------+---------+-----------+----------+-------------------+  FV Prox   Full            Yes       Yes                                         +---------+---------------+---------+-----------+----------+-------------------+  FV Mid    Full                                                                  +---------+---------------+---------+-----------+----------+-------------------+  FV Distal Full            Yes       Yes                                         +---------+---------------+---------+-----------+----------+-------------------+  PFV       Full            Yes       Yes                                         +---------+---------------+---------+-----------+----------+-------------------+  POP                       Yes       Yes  Patient unable to                                                                bend the knee for                                                                compression          +---------+---------------+---------+-----------+----------+-------------------+  PTV       Full                                                                  +---------+---------------+---------+-----------+----------+-------------------+  PERO      Full                                                                   +---------+---------------+---------+-----------+----------+-------------------+   +---------+---------------+---------+-----------+----------+--------------+  LEFT      Compressibility Phasicity Spontaneity Properties Thrombus Aging  +---------+---------------+---------+-----------+----------+--------------+  CFV       Full            Yes       Yes                                    +---------+---------------+---------+-----------+----------+--------------+  SFJ       Full                                                             +---------+---------------+---------+-----------+----------+--------------+  FV Prox   Full            Yes       Yes                                    +---------+---------------+---------+-----------+----------+--------------+  FV Mid    Full                                                             +---------+---------------+---------+-----------+----------+--------------+  FV Distal Full            Yes       Yes                                    +---------+---------------+---------+-----------+----------+--------------+  PFV       Full            Yes       Yes                                    +---------+---------------+---------+-----------+----------+--------------+  POP       Full            Yes       Yes                                    +---------+---------------+---------+-----------+----------+--------------+  PTV       Full                                                             +---------+---------------+---------+-----------+----------+--------------+  PERO      Full                                                             +---------+---------------+---------+-----------+----------+--------------+     Summary: Right: There is no evidence of deep vein thrombosis in the lower extremity. No cystic structure found in the popliteal fossa. Left: There is no evidence of deep vein thrombosis in the lower extremity. No cystic structure found in the popliteal fossa.  *See  table(s) above for measurements and observations. Electronically signed by Monica Martinez MD on 11/28/2018 at 3:19:13 PM.    Final       IMPRESSION/PLAN: 81. 73 y.o. 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 Dr. Tammi Klippel has reviewed his case in discussion this morning in brain oncology conference. He would be a candidate for palliative radiotherapy to the levels involved. We will also get a staging CT CAP to help medical oncology make decisions regarding additional treatment needs. We discussed a course of 10 fractions to the low thoracic and T spine, reviewing the risks, benefits, short, and long term effects of radiotherapy. He will be brought via [facility]link to [facility] radiation department at 3 pm for simulation as well. We will start radiotherapy tomorrow, and reach out to his hospitalist physciain to see if he can transfer to [facility] while receiving radiotherapy.   In a visit lasting 70 minutes, greater than 50% of the time was spent by phone and in floor time, coordinating the patient's care.    Carola Rhine, [facility]   Page Me

## 2018-12-02 NOTE — Progress Notes (Signed)
  Radiation Oncology         [phone] ________________________________  Name: KAVAUGHN FAUCETT MRN: [id number]  Date: 12/02/2018  DOB: 1945/08/23  INPATIENT  SIMULATION AND TREATMENT PLANNING NOTE    ICD-10-CM   1. 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  Identity was confirmed.  All relevant records and images related to the planned course of therapy were reviewed.  The patient freely provided informed written consent to proceed with treatment after reviewing the details related to the planned course of therapy. The consent form was witnessed and verified by the simulation staff.  Then, the patient was set-up in a stable reproducible  supine position for radiation therapy.  CT images were obtained.  Surface markings were placed.  The CT images were loaded into the planning software.  Then the target and avoidance structures were contoured including kidneys.  Treatment planning then occurred.  The radiation prescription was entered and confirmed.  Then, I designed and supervised the construction of a total of 3 medically necessary complex treatment devices with VacLoc positioner and 2 MLCs to shield kidneys.  I have requested : 3D Simulation  I have requested a DVH of the following structures: Left Kidney, Right Kidney and target.  PLAN:  The patient will receive 30 Gy in 10 fractions.  ________________________________  Sheral Apley Tammi Klippel, M.D.

## 2018-12-03 ENCOUNTER — Ambulatory Visit
Admit: 2018-12-03 | Discharge: 2018-12-03 | Disposition: A | Discharge status: Home / Self Care | Payer: Medicare Other | Attending: Radiation Oncology | Admitting: Radiation Oncology

## 2018-12-03 ENCOUNTER — Other Ambulatory Visit: Payer: Self-pay

## 2018-12-03 ENCOUNTER — Encounter (HOSPITAL_COMMUNITY): Payer: Self-pay

## 2018-12-03 DIAGNOSIS — C7949 Secondary malignant neoplasm of other parts of nervous system: Secondary | ICD-10-CM

## 2018-12-03 MED ORDER — INFLUENZA VAC A&B SA ADJ QUAD 0.5 ML IM PRSY
0.5000 mL | PREFILLED_SYRINGE | INTRAMUSCULAR | Status: AC
  Administered 2018-12-04: 0.5 mL via INTRAMUSCULAR
  Filled 2018-12-03: qty 0.5

## 2018-12-03 MED ORDER — ENSURE ENLIVE PO LIQD: 237.0000 mL | Freq: Two times a day (BID) | ORAL | Status: DC

## 2018-12-03 MED ORDER — APIXABAN 5 MG PO TABS
5.0000 mg | ORAL_TABLET | Freq: Two times a day (BID) | ORAL | Status: DC
  Administered 2018-12-03 – 2018-12-17 (×29): 5 mg via ORAL
  Filled 2018-12-03 (×29): qty 1

## 2018-12-03 NOTE — Progress Notes (Signed)
Obtained from from [facility]. Scott, transporter, wheeled patient on hospital stretcher to 1401.  Phoned Baxter Flattery, RN to confirm she received reports from [place] at [facility] and she confirmed she had. Explained patient is scheduled for his next radiation treatment at 3 pm tomorrow. Patient left cancer center in no distress with a saline locked 22 gauge left forearm and his belongings.

## 2018-12-03 NOTE — Plan of Care (Signed)
Patient anxiety level increased d/t concerned about insurance coverage. Case manager spoke with him to ease his concerns.

## 2018-12-03 NOTE — TOC Initial Note (Signed)
Transition of Care [facility]) - Initial/Assessment Note    Patient Details  Name: Benjamin [name] MRN: [id number] Date of Birth: 1945/08/23  Transition of Care [facility]) CM/SW Contact:    Geralynn Ochs, LCSW Phone Number: 12/03/2018, 4:04 PM  Clinical Narrative:   CSW initially met with patient to discuss concerns reported by RN about patient's worries about his insurance not covering both [place] and [facility], as he was refusing transfer for oncology care. Patient said that he had called Tammy at [facility] and she assured him that everything would be covered, and CSW discussed how it would end up saving money by not needing [facility] to transport him back every day. Patient in agreement to transfer, if bed available.  CSW also confirmed with patient that the plan is to return to [facility] whenever he's stable. Patient appreciative of CSW assistance.   Expected Discharge Plan: Skilled Nursing Facility Barriers to Discharge: Continued Medical Work up   Patient Goals and CMS Choice        Expected Discharge Plan and Services Expected Discharge Plan: [place] Choice: [place] arrangements for the past 2 months: [place]                                      Prior Living Arrangements/Services Living arrangements for the past 2 months: [place] Lives with:: Facility Resident Patient language and need for interpreter reviewed:: No Do you feel safe going back to the place where you live?: Yes      Need for Family Participation in Patient Care: No (Comment) Care giver support system in place?: Yes (comment)   Criminal Activity/Legal Involvement Pertinent to Current Situation/Hospitalization: No - Comment as needed  Activities of Daily Living Home Assistive Devices/Equipment: Wheelchair ADL Screening (condition at time of admission) Patient's cognitive ability adequate to  safely complete daily activities?: Yes Is the patient deaf or have difficulty hearing?: No Does the patient have difficulty seeing, even when wearing glasses/contacts?: No Does the patient have difficulty concentrating, remembering, or making decisions?: No Patient able to express need for assistance with ADLs?: Yes Does the patient have difficulty dressing or bathing?: Yes Independently performs ADLs?: No Communication: Independent Dressing (OT): Dependent Is this a change from baseline?: Pre-admission baseline Grooming: Needs assistance Is this a change from baseline?: Pre-admission baseline Feeding: Independent Bathing: Needs assistance Is this a change from baseline?: Pre-admission baseline Toileting: Needs assistance Is this a change from baseline?: Pre-admission baseline In/Out Bed: Dependent Is this a change from baseline?: Pre-admission baseline Walks in Home: Dependent Is this a change from baseline?: Pre-admission baseline Does the patient have difficulty walking or climbing stairs?: Yes Weakness of Legs: Both Weakness of Arms/Hands: Both  Permission Sought/Granted Permission sought to share information with : Chartered certified accountant granted to share information with : Yes, Verbal Permission Granted     Permission granted to share info w AGENCY: [place]        Emotional Assessment Appearance:: Appears stated age Attitude/Demeanor/Rapport: Engaged Affect (typically observed): Pleasant Orientation: : Oriented to Self, Oriented to Place, Oriented to  Time, Oriented to Situation Alcohol / Substance Use: Not Applicable Psych Involvement: No (comment)  Admission diagnosis:  Primary malignant neoplasm of bronchus of right lower lobe ([place]) [C34.31] Paraplegia ([place]) [G82.20] Patient Active Problem List   Diagnosis Date Noted  .  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 Lumbar stenosis with neurogenic claudication 11/26/2018  . Paraplegia  ([place]) 11/26/2018  . Hyponatremia 11/26/2018  . Chronic anxiety 11/23/2018  . Chronic constipation 08/31/2018  . Chronic non-seasonal allergic rhinitis 08/08/2018  . Urinary retention 08/05/2018  . UTI due to extended-spectrum beta lactamase (ESBL) producing Escherichia coli 08/01/2018  . Cauda equina syndrome ([place]) 07/25/2018  . Bilateral lower extremity edema 07/25/2018  . Incarcerated inguinal hernia 05/24/2018  . Complete heart block ([place]) 08/29/2016  . 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 Pneumothorax after biopsy 08/24/2015  . Hyperlipidemia 09/14/2014  . Mobitz type 1 second degree atrioventricular block 10/17/2012  . Hx of NSTEMI tx with DES to RCA and DES to LCx in 2014 10/16/2012  . Chest pain, atypical 07/02/2012  . Malignant neoplasm of hilus of lung ([place])   . Adenopathy   . Anxiety   . Arthritis   . Hx of melanoma of skin   . Permanent atrial fibrillation   . CAD (coronary artery disease)    PCP:  Hennie Duos, MD Pharmacy:   [place], [place] - [address] [place] [address] Phone: [phone] Fax: [phone]  [facility] - [address] [place], [place] [address] Phone: [phone] Fax: [phone]  [facility] - [place], [place] - [address] [place] [address] Phone: [phone] Fax: [phone]     Social Determinants of Health (SDOH) Interventions    Readmission Risk Interventions No flowsheet data found.

## 2018-12-03 NOTE — Progress Notes (Signed)
Spoke with Marden Noble at [facility] this morning. Arranged to have patient delivered to L1 for radiation treatment at 1345 today. Then, phoned [place], RN caring for patient today on [address]. She confirms the patient is stable enough to be transported back and forth. Requested patient be medicated prior to transport today. Requested she call report to [facility] and provided her with the number. She verbalized understanding and committed to calling. Therapist on L1 informed of this arrangement.

## 2018-12-03 NOTE — Progress Notes (Addendum)
The patient started XRT today and we reviewed Dr. Gearldine Shown plan for consult in the next day. We reviewed his course and his CT imaging results. He started lower cord treatment to the equina region, but would hold off on the less bulky disease in the upper T spine after discussing with Dr. Tammi Klippel.   Carola Rhine, PAC

## 2018-12-03 NOTE — Progress Notes (Signed)
PROGRESS NOTE  Benjamin [name]  DOB: 1945/11/20  PCP: Hennie Duos, MD LNL:[id number]  DOA: 11/26/2018  LOS: 7 days   Brief narrative: Benjamin [name] is a 73 y.o. male with PMH of non-small cell lung cancer and paroxysmal atrial fibrillation on Eliquis at home. Patient presented to the ED on 11/26/2018 for further evaluation of bilateral lower extremity thoracic paralysis with sensory loss since last May. He started having some symptoms since April, rapidly progressive bilateral lower extremity weakness and sensory changes as well as bowel and bladder incontinence. Patient underwent lumbar decompression surgery. He did not have improvement of symptoms. Patient also has history of lung cancer, he had radiation therapies and treatment at least 6 times for the cancer since 2003 as per patient. Due to persistent symptoms and need for further management he was brought to the hospital by neurosurgery, Dr. Ellene Route. MRI thoracic and lumbar spine showed enhancing mass lesion in the conus medullaris. Multiple small enhancing masses along the surface of the cord in the upper and midthoracic spine. 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 MRI brain showed no metastases to the brain or cerebral meninges.  Admitted to medical service with neurology, neurosurgery and radiation oncology consultation. Patient is planned for 10 days of palliative radiation treatment at [facility] long. Patient is being transferred for further care at [facility] long hospital  Subjective: Patient was seen and examined this morning.  Not in distress.  No new symptoms.  Continues to have paraplegia.  No improvement. Patient is being transferred for further care at [facility] long hospital  Assessment/Plan: Bilateral lower extremity weakness with paraplegia: -MRI report as above showing multiple mass lesions in thoracic and lumbar spine, likely metastatic lung cancer.  No bony involvement, no  brain mets.  -10 days of palliative radiation treatment at [facility] long planned per radiation oncology.  Neurogenic bladder -Indwelling Foley catheter  Acute on chronic hyponatremia:  -Presented with low sodium level of 122, suspected SIADH. Fluid restrictions 1200 mL a day. Started on salt tablets. Sodium level gradually improving, 129 on last check on 9/14.  Continue Lasix and Aldactone. Currently no indication for treatment with hypertonic saline, will continue to closely monitor.  Paroxysmal atrial fibrillation:  Prior to admission, patient was on Toprol and Eliquis.  Currently normal sinus rhythm in 60s. Eliquis was held this admission for possible surgical intervention.  Since neurosurgery is not planning for any surgical intervention at this time, I will resume Eliquis today.    Complete heart block status post pacemaker: Stable.   Chronic diastolic heart failure: euvolumic, stable on Lasix and Aldactone  Coronary artery disease: Continue statin  BPH: Flomax  Mobility: Paraplegic Diet: Cardiac diet DVT prophylaxis:  Eliquis resumed today Code Status:  Code Status: Full Code  Family Communication: Expected Discharge:  10 days of radiation planned.  Patient probably will return back to SNF at [facility]. Signout given to hospitalist Dr. Nevada Crane, [facility].  Consultants:  Neurosurgery  Neurology, signed off  Radiation oncology  Antimicrobials: Anti-infectives (From admission, onward)   None     Infusions:    Scheduled Meds: . apixaban  5 mg Oral BID  . atorvastatin  20 mg Oral Daily  . clonazePAM  1 mg Oral TID  . feeding supplement (PRO-STAT SUGAR FREE 64)  30 mL Oral BID  . furosemide  40 mg Oral Daily  . gabapentin  100 mg Oral QHS  . ipratropium-albuterol  3 mL Nebulization BID  . metoprolol succinate  25 mg Oral Daily  . sodium chloride  1 g Oral TID WC  . spironolactone  25 mg Oral Daily  . tamsulosin  0.4 mg Oral QPM    PRN meds:  acetaminophen, HYDROcodone-acetaminophen, magnesium hydroxide, nitroGLYCERIN   Objective: Vitals:   12/03/18 0904 12/03/18 0941  BP: 127/81 132/77  Pulse: 61 71  Resp:  18  Temp:  98.2 F (36.8 C)  SpO2:  99%    Intake/Output Summary (Last 24 hours) at 12/03/2018 1034 Last data filed at 12/03/2018 0403 Gross per 24 hour  Intake 240 ml  Output 2750 ml  Net -2510 ml   There were no vitals filed for this visit. Weight change:  Body mass index is 26.92 kg/m.   Physical Exam: General exam: Appears calm and comfortable.  Lying in bed.  Not in distress Skin: No rashes, lesions or ulcers. HEENT: Atraumatic, normocephalic, supple neck, no obvious bleeding Lungs: Clear to auscultation bilaterally CVS: Regular rate and rhythm, no murmur GI/Abd soft, nontender, nondistended, also present CNS: Alert, awake, oriented x3, paraplegic Psychiatry: Mood appropriate, judgment and insight clear Extremities: No pedal edema, no calf tenderness  Data Review: I have personally reviewed the laboratory data and studies available.  Recent Labs  Lab 11/26/18 1840 11/27/18 0316 12/01/18 0324  WBC 8.6 7.5 7.4  NEUTROABS 6.9  --  5.1  HGB 14.6 13.8 14.0  HCT 42.6 39.6 40.5  MCV 85.2 84.3 85.8  PLT 219 186 207   Recent Labs  Lab 11/27/18 0316 11/28/18 0330 11/29/18 0314 11/30/18 0320 12/01/18 0324  NA 122* 124* 128* 128* 129*  K 3.9 3.9 4.1 4.2 4.3  CL 89* 92* 94* 94* 96*  CO2 24 22 24 25 25   GLUCOSE 112* 102* 106* 106* 107*  BUN 14 19 20 21  27*  CREATININE 0.68 0.62 0.73 0.76 0.72  CALCIUM 8.5* 8.5* 8.6* 8.6* 8.8*    [name], MD  [facility] 12/03/2018

## 2018-12-04 ENCOUNTER — Ambulatory Visit
Admit: 2018-12-04 | Discharge: 2018-12-04 | Disposition: A | Discharge status: Home / Self Care | Payer: Medicare Other | Attending: Radiation Oncology | Admitting: Radiation Oncology

## 2018-12-04 ENCOUNTER — Encounter (HOSPITAL_COMMUNITY): Payer: Self-pay

## 2018-12-04 ENCOUNTER — Encounter: Payer: Self-pay | Admitting: Radiation Oncology

## 2018-12-04 DIAGNOSIS — C7949 Secondary malignant neoplasm of other parts of nervous system: Principal | ICD-10-CM

## 2018-12-04 DIAGNOSIS — G893 Neoplasm related pain (acute) (chronic): Secondary | ICD-10-CM

## 2018-12-04 DIAGNOSIS — I480 Paroxysmal atrial fibrillation: Secondary | ICD-10-CM

## 2018-12-04 DIAGNOSIS — L8990 Pressure ulcer of unspecified site, unspecified stage: Secondary | ICD-10-CM | POA: Insufficient documentation

## 2018-12-04 DIAGNOSIS — C3490 Malignant neoplasm of unspecified part of unspecified bronchus or lung: Secondary | ICD-10-CM

## 2018-12-04 DIAGNOSIS — C3431 Malignant neoplasm of lower lobe, right bronchus or lung: Secondary | ICD-10-CM

## 2018-12-04 DIAGNOSIS — G9520 Unspecified cord compression: Secondary | ICD-10-CM

## 2018-12-04 DIAGNOSIS — L8942 Pressure ulcer of contiguous site of back, buttock and hip, stage 2: Secondary | ICD-10-CM

## 2018-12-04 DIAGNOSIS — G834 Cauda equina syndrome: Secondary | ICD-10-CM

## 2018-12-04 MED ORDER — ADULT MULTIVITAMIN W/MINERALS CH
1.0000 | ORAL_TABLET | Freq: Every day | ORAL | Status: DC
  Administered 2018-12-04 – 2018-12-10 (×7): 1 via ORAL
  Filled 2018-12-04 (×7): qty 1

## 2018-12-04 MED ORDER — ENSURE ENLIVE PO LIQD
237.0000 mL | ORAL | Status: DC
  Administered 2018-12-05 – 2018-12-16 (×9): 237 mL via ORAL

## 2018-12-04 MED ORDER — PRO-STAT SUGAR FREE PO LIQD
30.0000 mL | Freq: Three times a day (TID) | ORAL | Status: DC
  Administered 2018-12-04 – 2018-12-17 (×29): 30 mL via ORAL
  Filled 2018-12-04 (×34): qty 30

## 2018-12-04 NOTE — Progress Notes (Signed)
Initial Nutrition Assessment  DOCUMENTATION CODES:   Not applicable  INTERVENTION:  - will decrease Ensure Enlive from BID to once/day, each supplement provides 350 kcal and 20 grams of protein. - will 30 mL Prostat from BID to TID, each supplement provides 100 kcal and 15 grams of protein. - will order daily multivitamin with minerals. - continue to encourage PO intakes.    NUTRITION DIAGNOSIS:   Increased nutrient needs related to acute illness, chronic illness, cancer and cancer related treatments, wound healing as evidenced by estimated needs.  GOAL:   Patient will meet greater than or equal to 90% of their needs  MONITOR:   PO intake, Supplement acceptance, Labs, Weight trends, Skin  REASON FOR ASSESSMENT:   Malnutrition Screening Tool  ASSESSMENT:   73 y.o. male with medical history of non-small cell lung cancer and a.fib on Eliquis. He presented to the ED on 9/8 for evaluation of BLE and thoracic paralysis with sensory loss since 07/2018. He was also experiencing bladder and bowel incontinence. Patient underwent lumbar decompression surgery but did not have any improvement in symptoms. MRI (thoracic and lumbar spine) showed enhancing mass lesion in the conus medullaris and multiple small enhancing masses along the surface of the spinal cord in the upper and mid-thoracic spine. Findings felt to be compatible with metastatic lung cancer. MRI brain showed no mets. He was admitted with plan for 10 days of palliative radiation.  Per flow sheet, patient consumed 100% of breakfast on 9/11; 80% of breakfast and lunch on 9/12; 100% of breakfast and lunch on 9/13; 90% of breakfast and 50% of lunch on 9/14; 50% of dinner on 9/15.   Per chart review, weight yesteday was 185 lb and weight on 9/8 was 192 lb indicating 7 lb weight loss x1 week; noted patient on diuretics during the week he has been admitted. Will continue to monitor weight trends closely.   Prostat was ordered BID on 9/8  and patient has accepted all packets of supplement since that date. Ensure Enlive was ordered BID starting this AM and he refused the one bottle that was offered to him.   Per notes: - radiation started on 9/15 - BLE weakness with paraplegia--s/p MRI which indicated likely metastatic lung cancer - neurogenic bladder--indwelling Foley - acute on chronic hyponatremia--NaCl tabs ordered, 1.2L fluid restriction - likely to return to SNF after radiation course    Labs reviewed; Na: 129 mmol/l, Cl: 96 mmol/l, BUN: 27 mg/dl. Medications reviewed; 40 mg oral lasix/day, 1 g NaCl TID, 25 mg aldactone/day.      NUTRITION - FOCUSED PHYSICAL EXAM:  unable to complete at this time.  Diet Order:   Diet Order            Diet Heart Room service appropriate? Yes; Fluid consistency: Thin; Fluid restriction: 1500 mL Fluid  Diet effective now              EDUCATION NEEDS:   No education needs have been identified at this time  Skin:  Skin Assessment: Skin Integrity Issues: Skin Integrity Issues:: Stage II Stage II: R buttocks and sacrum  Last BM:  9/16  Height:   Ht Readings from Last 1 Encounters:  12/03/18 5\' 11"  (1.803 m)    Weight:   Wt Readings from Last 1 Encounters:  12/03/18 83.8 kg    Ideal Body Weight:  78.2 kg  BMI:  Body mass index is 25.77 kg/m.  Estimated Nutritional Needs:   Kcal:  2300-2500 kcal  Protein:  115-125 grams  Fluid:  >/= 2.3 L/day     Jarome Matin, MS, RD, LDN, [facility] Inpatient Clinical Dietitian Pager # [phone] After hours/weekend pager # [phone]

## 2018-12-04 NOTE — Care Management Important Message (Signed)
Important Message  Patient Details IM Letter given to Dessa Phi RN to present to the Patient Name: Benjamin [name] MRN: [id number] Date of Birth: [birth year]   Medicare Important Message Given:  Yes     Kerin Salen 12/04/2018, 11:16 AM

## 2018-12-04 NOTE — Progress Notes (Signed)
12/04/2018. 9323. Faxed pacemaker form to Dr. Cristopher Peru to complete. Updated pacemaker information/preferences needed from Dr. Lovena Le since patient requires radiation to spine. Awaiting return fax.

## 2018-12-04 NOTE — Progress Notes (Signed)
PROGRESS NOTE    Benjamin [name]  QPY:[id number] DOB: [birth year] DOA: 11/26/2018 PCP: Hennie Duos, MD     Brief Narrative:  73 y.o.malewith PMH of non-small cell lung cancer and paroxysmal atrial fibrillation on Eliquis at home. Patient presented to the ED on9/8/2020for further evaluation of bilateral lower extremity thoracic paralysis with sensory loss since last May. He started having some symptoms since April, rapidly progressive bilateral lower extremity weakness and sensory changes as well as bowel and bladder incontinence. Patient underwent lumbar decompression surgery. He did not have improvement of symptoms. Patient also has history of lung cancer, he had radiation therapies and treatment at least 6 times for the cancer since 2003 as per patient. Due to persistent symptoms and need for further management he was brought to the hospital by neurosurgery, Dr. Ellene Route. MRI thoracic and lumbar spine showed enhancing mass lesion in the conus medullaris. Multiple small enhancing masses along the surface of the cord in the upper and midthoracic spine. 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 MRI brain showed no metastases to the brain or cerebral meninges.  Admitted to medical service with neurology, neurosurgery and radiation oncology consultation. Patient is planned for 10 days of palliative radiation treatment at [facility].   Assessment & Plan: 1-bilateral lower extremity weakness with paraplegia MRI demonstrating multiple mass lesions in thoracic and lumbar spine, likely metastatic lung cancer -No bone involvement or brain metastases appreciated. -Continue palliative radiation treatment by radiation oncology -Follow further recommendations by oncology service -Palliative care has been consulted for goals of care, advance directive and assisting with pain management.  2-neurogenic bladder -Continue indwelling Foley  catheter.  3-acute on chronic hyponatremia -Sodium has increased appropriately -Continue fluid restriction, Lasix and Aldactone -Patient also on daily sodium tablets -Neurologic intact -Most recent sodium in the 129 range. -Continue to monitor level intermittently   4-chronic diastolic heart failure -Stable and compensated -Continue Lasix and Aldactone.  5-history of complete heart block status post pacemaker -Stable -Continue telemetry monitoring.  6-history of paroxysmal atrial fibrillation -Currently in normal sinus rhythm -Continue metoprolol for rate control. -Continue Eliquis for secondary prevention.  7-BPH -continue flomax.  8-pressure injury -Stage II pressure injury appreciated in his sacrum and right buttocks -Continue preventive measure -No signs of breakdown of superimposed infection.    DVT prophylaxis: Eliquis Code Status: Full Family Communication: No family at bedside Disposition Plan: Remains inpatient; continue radiation therapy; follow palliative care and oncology service recommendations.  Consultants:   -Oncology  -Neurosurgery  -Radiation oncology  -Palliative care.  Procedures:   See below for x-ray reports.  Antimicrobials:  Anti-infectives (From admission, onward)   None       Subjective: Reports having pain in his back, no chest pain, no shortness of breath, no nausea, no vomiting.  Patient is afebrile.  Objective: Vitals:   12/03/18 1451 12/03/18 2108 12/03/18 2130 12/04/18 0418  BP: 120/79  114/72 113/76  Pulse: (!) 59  (!) 59 68  Resp: 16  18 15   Temp: 98 F (36.7 C)  98 F (36.7 C) (!) 97.3 F (36.3 C)  TempSrc: Oral  Oral Oral  SpO2: 100% 95% 98% 95%  Weight: 83.8 kg     Height: 5\' 11"  (1.803 m)       Intake/Output Summary (Last 24 hours) at 12/04/2018 1035 Last data filed at 12/04/2018 0600 Gross per 24 hour  Intake 720 ml  Output 775 ml  Net -55 ml   [employer]  12/03/18 1451  Weight: 83.8 kg     Examination: General exam: Alert, awake, oriented x 3; paraplegic, in no major distress.  Reports pain in his back.  Patient is afebrile. Respiratory system: Clear to auscultation. Respiratory effort normal. Cardiovascular system:RRR. No murmurs, rubs, gallops. Gastrointestinal system: Abdomen is nondistended, soft and nontender. No organomegaly or masses felt. Normal bowel sounds heard. Central nervous system: Alert and oriented. No focal neurological deficits. Extremities: No cyanosis or clubbing. Skin: No petechiae; stage II pressure injury appreciated in his sacrum and right buttocks.  No signs of superimposed infection. Psychiatry: Judgement and insight appear normal. Mood & affect appropriate.    Data Reviewed: I have personally reviewed following labs and imaging studies  CBC: Recent Labs  Lab 12/01/18 0324  WBC 7.4  NEUTROABS 5.1  HGB 14.0  HCT 40.5  MCV 85.8  PLT 299   Basic Metabolic Panel: Recent Labs  Lab 11/28/18 0330 11/29/18 0314 11/30/18 0320 12/01/18 0324  NA 124* 128* 128* 129*  K 3.9 4.1 4.2 4.3  CL 92* 94* 94* 96*  CO2 22 24 25 25   GLUCOSE 102* 106* 106* 107*  BUN 19 20 21  27*  CREATININE 0.62 0.73 0.76 0.72  CALCIUM 8.5* 8.6* 8.6* 8.8*   GFR: Estimated Creatinine Clearance: 85 mL/min (by C-G formula based on SCr of 0.72 mg/dL).  Urine analysis:    Component Value Date/Time   COLORURINE YELLOW 08/04/2018 [place] 08/04/2018 1338   LABSPEC 1.017 08/04/2018 1338   PHURINE 6.0 08/04/2018 1338   GLUCOSEU NEGATIVE 08/04/2018 1338   HGBUR SMALL (A) 08/04/2018 1338   [place] 08/04/2018 [place] 08/04/2018 [place] 08/04/2018 1338   NITRITE NEGATIVE 08/04/2018 [place] 08/04/2018 1338    Recent Results (from the past 240 hour(s))  SARS CORONAVIRUS 2 (TAT 6-24 HRS) Nasopharyngeal Nasopharyngeal Swab     Status: None   Collection Time: 11/27/18  8:51 AM    Specimen: Nasopharyngeal Swab  Result Value Ref Range Status   SARS Coronavirus 2 NEGATIVE NEGATIVE Final    Comment: (NOTE) SARS-CoV-2 target nucleic acids are NOT DETECTED. The SARS-CoV-2 RNA is generally detectable in upper and lower respiratory specimens during the acute phase of infection. Negative results do not preclude SARS-CoV-2 infection, do not rule out co-infections with other pathogens, and should not be used as the sole basis for treatment or other patient management decisions. Negative results must be combined with clinical observations, patient history, and epidemiological information. The expected result is Negative. Fact Sheet for Patients: SugarRoll.be Fact Sheet for Healthcare Providers: https://www.woods-mathews.com/ This test is not yet approved or cleared by the [place] FDA and  has been authorized for detection and/or diagnosis of SARS-CoV-2 by FDA under an Emergency Use Authorization (EUA). This EUA will remain  in effect (meaning this test can be used) for the duration of the COVID-19 declaration under Section 56 4(b)(1) of the Act, 21 U.S.C. section 360bbb-3(b)(1), unless the authorization is terminated or revoked sooner. Performed at [facility], [address], [place], [place] [address]      Radiology Studies: Ct Chest W Contrast  Result Date: 12/02/2018 CLINICAL DATA:  Restaging lung cancer. EXAM: CT CHEST, ABDOMEN, AND PELVIS WITH CONTRAST TECHNIQUE: Multidetector CT imaging of the chest, abdomen and pelvis was performed following the standard protocol during bolus administration of intravenous contrast. CONTRAST:  116mL OMNIPAQUE IOHEXOL 300 MG/ML  SOLN COMPARISON:  None. CT chest  07/10/2018 and CT AP from 05/24/2018. FINDINGS: CT CHEST FINDINGS Cardiovascular: Normal heart size. No pericardial effusion identified. Aortic atherosclerosis. Three vessel coronary artery atherosclerotic calcifications.  Left chest wall pacer device is noted with lead in the right atrial appendage and coronary sinus. Mediastinum/Nodes: Normal appearance of the thyroid gland. The trachea appears patent and is midline. Normal appearance of the esophagus. No mediastinal or hilar adenopathy. No axillary or supraclavicular adenopathy. Lungs/Pleura: No pleural effusion identified. Status post left upper lobectomy. Masslike architectural distortion within the perihilar left upper lobe adjacent to the suture line with associated loculated pleural thickening and fluid is unchanged from the previous exam. Within the superior segment of the right lower lobe there is a part solid nodule measuring 2.9 cm, image 53/4. Previously 3.3 cm. Adjacent architectural distortion and pleural thickening is similar. Postsurgical changes within the medial aspect of the right upper lung with suture line is unchanged. Purely ground-glass attenuating nodule within the anterior right middle lobe measures 1.0 cm, image 74/4. Unchanged. Part solid nodule within the anterolateral right lower lobe measures 5 cm, image 91/4. Stable from previous study. Calcified granuloma identified in the subpleural, posterior right lower lobe. No new pulmonary nodule or mass identified bilaterally. Musculoskeletal: Thoracic spondylosis. Unchanged displaced right lateral fifth rib deformity, image 33/3. No acute or suspicious osseous abnormality. CT ABDOMEN PELVIS FINDINGS Hepatobiliary: No focal liver abnormality is seen. No gallstones, gallbladder wall thickening, or biliary dilatation. Pancreas: Unremarkable. No pancreatic ductal dilatation or surrounding inflammatory changes. Spleen: Normal in size without focal abnormality. Adrenals/Urinary Tract: Status post right adrenalectomy. Normal left adrenal gland. The kidneys are unremarkable. Thick walled urinary bladder is collapsed around a Foley catheter. Stomach/Bowel: Stomach is within normal limits. Appendix appears normal. No  evidence of bowel wall thickening, distention, or inflammatory changes. Vascular/Lymphatic: Aortic atherosclerosis. No aneurysm identified. No pelvic or inguinal adenopathy. Reproductive: Prostate gland enlargement. Other: No free fluid or fluid collections. Musculoskeletal: No acute or suspicious osseous abnormality. Lumbar degenerative disc disease. There are dysplastic changes involving the right femoral head with marked secondary degenerative changes. Moderate degenerative changes involve the left hip. IMPRESSION: 1. Stable exam. Similar appearance of post treatment changes within bilateral lungs without evidence for disease progression/recurrence. 2. Stable appearance of part solid nodules within the right lung. 3. Pure ground-glass attenuating nodule within the anterior right upper lobe is also unchanged. 4. Aortic Atherosclerosis (ICD10-I70.0) and Emphysema (ICD10-J43.9). 5. Coronary artery atherosclerotic calcifications. Electronically Signed   By: Kerby Moors M.D.   On: 12/02/2018 14:41   Ct Abdomen Pelvis W Contrast  Result Date: 12/02/2018 CLINICAL DATA:  Restaging lung cancer. EXAM: CT CHEST, ABDOMEN, AND PELVIS WITH CONTRAST TECHNIQUE: Multidetector CT imaging of the chest, abdomen and pelvis was performed following the standard protocol during bolus administration of intravenous contrast. CONTRAST:  167mL OMNIPAQUE IOHEXOL 300 MG/ML  SOLN COMPARISON:  None. CT chest 07/10/2018 and CT AP from 05/24/2018. FINDINGS: CT CHEST FINDINGS Cardiovascular: Normal heart size. No pericardial effusion identified. Aortic atherosclerosis. Three vessel coronary artery atherosclerotic calcifications. Left chest wall pacer device is noted with lead in the right atrial appendage and coronary sinus. Mediastinum/Nodes: Normal appearance of the thyroid gland. The trachea appears patent and is midline. Normal appearance of the esophagus. No mediastinal or hilar adenopathy. No axillary or supraclavicular adenopathy.  Lungs/Pleura: No pleural effusion identified. Status post left upper lobectomy. Masslike architectural distortion within the perihilar left upper lobe adjacent to the suture line with associated loculated pleural thickening and fluid is unchanged from the previous  exam. Within the superior segment of the right lower lobe there is a part solid nodule measuring 2.9 cm, image 53/4. Previously 3.3 cm. Adjacent architectural distortion and pleural thickening is similar. Postsurgical changes within the medial aspect of the right upper lung with suture line is unchanged. Purely ground-glass attenuating nodule within the anterior right middle lobe measures 1.0 cm, image 74/4. Unchanged. Part solid nodule within the anterolateral right lower lobe measures 5 cm, image 91/4. Stable from previous study. Calcified granuloma identified in the subpleural, posterior right lower lobe. No new pulmonary nodule or mass identified bilaterally. Musculoskeletal: Thoracic spondylosis. Unchanged displaced right lateral fifth rib deformity, image 33/3. No acute or suspicious osseous abnormality. CT ABDOMEN PELVIS FINDINGS Hepatobiliary: No focal liver abnormality is seen. No gallstones, gallbladder wall thickening, or biliary dilatation. Pancreas: Unremarkable. No pancreatic ductal dilatation or surrounding inflammatory changes. Spleen: Normal in size without focal abnormality. Adrenals/Urinary Tract: Status post right adrenalectomy. Normal left adrenal gland. The kidneys are unremarkable. Thick walled urinary bladder is collapsed around a Foley catheter. Stomach/Bowel: Stomach is within normal limits. Appendix appears normal. No evidence of bowel wall thickening, distention, or inflammatory changes. Vascular/Lymphatic: Aortic atherosclerosis. No aneurysm identified. No pelvic or inguinal adenopathy. Reproductive: Prostate gland enlargement. Other: No free fluid or fluid collections. Musculoskeletal: No acute or suspicious osseous  abnormality. Lumbar degenerative disc disease. There are dysplastic changes involving the right femoral head with marked secondary degenerative changes. Moderate degenerative changes involve the left hip. IMPRESSION: 1. Stable exam. Similar appearance of post treatment changes within bilateral lungs without evidence for disease progression/recurrence. 2. Stable appearance of part solid nodules within the right lung. 3. Pure ground-glass attenuating nodule within the anterior right upper lobe is also unchanged. 4. Aortic Atherosclerosis (ICD10-I70.0) and Emphysema (ICD10-J43.9). 5. Coronary artery atherosclerotic calcifications. Electronically Signed   By: Kerby Moors M.D.   On: 12/02/2018 14:41    Scheduled Meds:  apixaban  5 mg Oral BID   atorvastatin  20 mg Oral Daily   clonazePAM  1 mg Oral TID   feeding supplement (ENSURE ENLIVE)  237 mL Oral BID BM   feeding supplement (PRO-STAT SUGAR FREE 64)  30 mL Oral BID   furosemide  40 mg Oral Daily   gabapentin  100 mg Oral QHS   ipratropium-albuterol  3 mL Nebulization BID   metoprolol succinate  25 mg Oral Daily   sodium chloride  1 g Oral TID WC   spironolactone  25 mg Oral Daily   tamsulosin  0.4 mg Oral QPM   Continuous Infusions:   LOS: 8 days    Time spent: 30 minutes.    Barton Dubois, MD [facility] Pager [phone]  12/04/2018, 10:35 AM  .

## 2018-12-04 NOTE — Progress Notes (Signed)
Physical Therapy Treatment Patient Details Name: KEVYN BOQUET MRN: [id number] DOB: Aug 19, 1945 Today's Date: 12/04/2018    History of Present Illness 73 year old male with history of non-small cell lung cancer and paroxysmal atrial fibrillation on Eliquis at home presented to the [facility] for further evaluation of bilateral lower extremity thoracic paralysis with sensory loss since last May.  He started having some symptoms since April, rapidly progressive bilateral lower extremity weakness and sensory changes as well as bowel and bladder incontinence.  Patient underwent lumbar decompression surgery.  He did not have improvement of symptoms.  Patient also has history of lung cancer, he had radiation therapies and treatment at least 6 times for the cancer since 2003 as per patient.  Due to persistent symptoms and need for further management he was brought to the hospital by neurosurgery, Dr. Ellene Route    PT Comments    Pt AxOx 4 and very aware of his unfortunate situation.  Assisted OOB to recliner.  General bed mobility comments: pt has no active B LE mvt and no feeling from L4 L5.  Assisted from supine to EOB via "log roll" + 2 assist then required heavy B UE's support to maintain static sitting balance.Limited dynamic sitting activities due to increased c/o back pain.  Minimal EOB tolerance as well due to increased pain.  General transfer comment: per chart review, attempted sliding board lateral scooting was unsuccessful as pt was unable to actively scoot and lack trunk stability.  Used Maxi Move to assist OOB to recliner.  Used multiple pillows in recliner for positioning and pressure relief. Pt would benefit from an over bed trapeze to increase self positioning and B LE Prevlone Boots for pressure relief and positioning.  Follow Up Recommendations  SNF;Supervision/Assistance - 24 hour     Equipment Recommendations  None recommended by PT    Recommendations for Other Services       Precautions / Restrictions Precautions Precautions: Back;Fall Precaution Comments: L4L5 para and back precautions pain management Restrictions Weight Bearing Restrictions: No    Mobility  Bed Mobility Overal bed mobility: Needs Assistance Bed Mobility: Rolling;Supine to Sit Rolling: Max assist;+2 for physical assistance   Supine to sit: Total assist;+2 for physical assistance;+2 for safety/equipment     General bed mobility comments: pt has no active B LE mvt and no feeling from L4 L5.  Assisted from supine to EOB via "log roll" + 2 assist then required heavy B UE's support to maintain static sitting balance.Limited dynamic sitting activities due to increased c/o back pain.  Minimal EOB tolerance as well due to increased pain.  Transfers                 General transfer comment: per chart review, attempted sliding board lateral scooting was unsuccessful as pt was unable to actively scoot and lack trunk stability.  Used Maxi Move to assist OOB to recliner.  Used multiple pillows in recliner for positioning and pressure relief.  Ambulation/Gait                 Stairs             Wheelchair Mobility    Modified Rankin (Stroke Patients Only)       Balance                                            Cognition  Arousal/Alertness: Awake/alert Behavior During Therapy: WFL for tasks assessed/performed Overall Cognitive Status: Within Functional Limits for tasks assessed                                        Exercises      General Comments        Pertinent Vitals/Pain Pain Assessment: Faces Faces Pain Scale: Hurts a little bit Pain Location: back and "legs feel like needles" Pain Descriptors / Indicators: Aching Pain Intervention(s): Monitored during session;Repositioned    Home Living                      Prior Function            PT Goals (current goals can now be found in the care plan section)  Progress towards PT goals: Progressing toward goals    Frequency    Min 2X/week      PT Plan Current plan remains appropriate    Co-evaluation              AM-PAC PT "6 Clicks" Mobility   Outcome Measure  Help needed turning from your back to your side while in a flat bed without using bedrails?: Total Help needed moving from lying on your back to sitting on the side of a flat bed without using bedrails?: Total Help needed moving to and from a bed to a chair (including a wheelchair)?: Total Help needed standing up from a chair using your arms (e.g., wheelchair or bedside chair)?: Total Help needed to walk in hospital room?: Total Help needed climbing 3-5 steps with a railing? : Total 6 Click Score: 6    End of Session Equipment Utilized During Treatment: Gait belt Activity Tolerance: No increased pain Patient left: in chair Nurse Communication: Mobility status;Need for lift equipment PT Visit Diagnosis: Other symptoms and signs involving the nervous system (R29.898);Muscle weakness (generalized) (M62.81);Pain     Time: 1224-4975 PT Time Calculation (min) (ACUTE ONLY): 23 min  Charges:  $Therapeutic Activity: 23-37 mins                     Rica Koyanagi  PTA Acute  Rehabilitation Services Pager      [phone] Office      [phone]

## 2018-12-04 NOTE — Progress Notes (Signed)
Palliative referral received for goals of care. Unfortunately patient is now preparing to get back in bed by staff and going down for assessment and radiation.   PMT will attempt to see tomorrow. Detailed notes and recommendations to follow.   Thank you for your referral and allowing PMT to assist in patient's care.   Alda Lea, AGPCNP-BC Palliative Medicine Team   NO CHARGE

## 2018-12-04 NOTE — Consult Note (Addendum)
[facility]  [phone] Fax:([phone] [place]  Referral MD: Dr. 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  The patient has been seen by medical oncology since 2016.  Since his last visit with [place], he has received radiation on several occasions.  We see below for details.  In about March of this year, he started to develop some lower extremity weakness and decreased sensation.  His symptoms continued to progress.  He was admitted in May 2020 for surgical decompression at L4-5 as a result of high-grade stenosis.  He really did not see much improvement in his neurological status.  He was sent to a skilled nursing facility for rehabilitation.  However, he states that he really had no improvement of his symptoms.  His neurological symptoms have progressed to the point that he has no control of his lower extremities.  He has no sensation about mid thigh down to his feet.  He is unable to walk and is in a wheelchair.  He is unable to control his bowel or bladder.  He was admitted due to worsening of his symptoms.  He had some difficulty getting an MRI secondary to his pacemaker.  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  He was eventually able to get an MRI of the brain which showed no metastatic disease to the brain or cerebral meninges.  He had a CT of the chest, abdomen,  pelvis with contrast performed on 12/02/2018 which was a stable exam and showed similar appearance of posttreatment changes within the bilateral lungs without evidence of disease progression/recurrence.  He has stable appearance of part solid nodules within the right lung,.  Groundglass attenuation nodule within the anterior right upper lobe also unchanged.  When seen today, the patient reports that he has some back pain.  He also reports some numbness and neuropathic type pain in his left chest and into his leg.  He has had some anorexia and weight loss at home.  Denies fevers and chills.  Denies chest discomfort, shortness of breath, cough.  Denies abdominal pain, nausea, vomiting.  Reports some constipation secondary to pain medication.  Has not noticed any epistaxis, hemoptysis, hematemesis, hematuria, melena, hematochezia.  Reports that he is unable to feel his lower extremities from about the mid calf down to his feet.  No decrease in sensation or motor function in his upper extremities.  Medical oncology was asked see the patient to make recommendations regarding his history of lung cancer and cauda equina syndrome.    Past Medical History:  Diagnosis Date  . Adenopathy    RIGHT ADRENAL  . Anxiety   . Arthritis    KNEES/HANDS  . CAD (coronary artery disease)    a. Prior known CTO of LCX with recanalizationby cath 2008;  b. 09/2012 NSTEMI/Cath/PCI: LM nl, LAD 29m D1 50p, LCX 100p (2.75x16 Promus Premier DES), 80-929mOM1 30, RCA 9032m.0x16 Promus Premier DES),  EF 50-55%.  [name] History of echocardiogram    Echo 2/18: Moderate LVH, EF 55-60, normal wall motion, trivial AI, moderate LAE, mild RVE, mild RAE, mildly elevated pulmonary pressure  . Hx of melanoma of skin   . 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 Malignant neoplasm of hilus of lung (HCC)    NON-SMALL-CELL    . Non-small cell lung cancer ([facility])    a. s/p resection and XRT.  [name] PAF (paroxysmal atrial fibrillation) ([facility])    a. not  currently on anticoagulation (09/2012)  . Presence of permanent cardiac pacemaker   . 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 Uses walker   :  Past Surgical History:  Procedure Laterality Date  . ADRENALEXTOMY  02/2000  . CARDIAC CATHETERIZATION  2008   LAD 40, D1 OK, D2 50, CFX 90, RCA OK, EF 60%; Consider PCI PRN but the stenosis is quite complex and we would end up jailing several large posterolateral branches    . HERNIA REPAIR  05/27/2018  . INGUINAL HERNIA REPAIR Right 05/27/2018   Procedure: INCARCERATED RIGHT INGUINAL HERNIA REPAIR;  Surgeon: Erroll Luna, MD;  Location: [facility];  Service: General;  Laterality: Right;  . LEFT HEART CATHETERIZATION WITH CORONARY ANGIOGRAM N/A 10/17/2012   Procedure: LEFT HEART CATHETERIZATION WITH CORONARY ANGIOGRAM;  Surgeon: Sherren Mocha, MD;  Location: [facility] CATH LAB;  Service: Cardiovascular;  Laterality: N/A;  . LOBECTOMY LEFT UPPER LOBE  08/15/2001  . LUMBAR LAMINECTOMY/DECOMPRESSION MICRODISCECTOMY N/A 07/25/2018   Procedure: Lumbar Four-Five Laminectomy;  Surgeon: Kristeen Miss, MD;  Location: [facility];  Service: Neurosurgery;  Laterality: N/A;  Lumbar Four-Five Laminectomy  . PACEMAKER IMPLANT N/A 08/29/2016   Procedure: Pacemaker Implant;  Surgeon: Evans Lance, MD;  Location: [facility] CV LAB;  Service: Cardiovascular;  Laterality: N/A;  . Right VATS, mini thoracotomy, wedge resection of right upper  01/18/2004  :  Current Facility-Administered Medications  Medication Dose Route Frequency Provider Last Rate Last Dose  . acetaminophen (TYLENOL) tablet 650 mg  650 mg Oral Q6H PRN Kristeen Miss, MD   650 mg at 12/03/18 1305  . apixaban (ELIQUIS) tablet 5 mg  5 mg Oral BID [name], MD   5 mg at 12/03/18 2204  . atorvastatin (LIPITOR) tablet 20 mg  20 mg Oral Daily Kristeen Miss, MD   20 mg at 12/03/18 0907  . clonazePAM (KLONOPIN) tablet 1 mg  1 mg Oral TID Kristeen Miss, MD   1 mg at 12/03/18 2204  . feeding supplement  (ENSURE ENLIVE) (ENSURE ENLIVE) liquid 237 mL  237 mL Oral BID BM Dahal, Binaya, MD      . feeding supplement (PRO-STAT SUGAR FREE 64) liquid 30 mL  30 mL Oral BID Jani Gravel, MD   30 mL at 12/03/18 2204  . furosemide (LASIX) tablet 40 mg  40 mg Oral Daily Dahal, Marlowe Aschoff, MD   40 mg at 12/03/18 0907  . gabapentin (NEURONTIN) capsule 100 mg  100 mg Oral Antoine Poche, MD   100 mg at 12/03/18 2204  . HYDROcodone-acetaminophen (NORCO/VICODIN) 5-325 MG per tablet 1-2 tablet  1-2 tablet Oral Q4H PRN Kristeen Miss, MD   1 tablet at 12/02/18 1443  . influenza vaccine adjuvanted (FLUAD) injection 0.5 mL  0.5 mL Intramuscular Tomorrow-1000 Dahal, Binaya, MD      . ipratropium-albuterol (DUONEB) 0.5-2.5 (3) MG/3ML nebulizer solution 3 mL  3 mL Nebulization BID Kristeen Miss, MD   3 mL at 12/03/18 2108  .  magnesium hydroxide (MILK OF MAGNESIA) suspension 30 mL  30 mL Oral Daily PRN Kristeen Miss, MD      . metoprolol succinate (TOPROL-XL) 24 hr tablet 25 mg  25 mg Oral Daily Barb Merino, MD   25 mg at 12/03/18 0906  . nitroGLYCERIN (NITROSTAT) SL tablet 0.4 mg  0.4 mg Sublingual Q5 Min x 3 PRN Kristeen Miss, MD      . sodium chloride tablet 1 g  1 g Oral TID WC Barb Merino, MD   1 g at 12/03/18 1718  . spironolactone (ALDACTONE) tablet 25 mg  25 mg Oral Daily Kristeen Miss, MD   25 mg at 12/03/18 0907  . tamsulosin (FLOMAX) capsule 0.4 mg  0.4 mg Oral QPM Kristeen Miss, MD   0.4 mg at 12/03/18 1718     Allergies  Allergen Reactions  . Codeine Other (See Comments)    Dizziness, stomach pain- "Allergic," per MAR  :  Family History  Problem Relation Age of Onset  . Heart attack Father   . Cancer Mother        lung  . Cancer Cousin        breast  :  Social History   Socioeconomic History  . Marital status: Divorced    Spouse name: Not on file  . Number of children: Not on file  . Years of education: Not on file  . Highest education level: Not on file  Occupational History  .  Occupation: Retired    Fish farm manager: [employer]  . Financial resource strain: Not on file  . Food insecurity    Worry: Not on file    Inability: Not on file  . Transportation needs    Medical: Not on file    Non-medical: Not on file  Tobacco Use  . Smoking status: Former Smoker    Packs/day: 1.00    Years: 40.00    Pack years: 40.00    Types: Cigarettes    Quit date: 08/15/2001    Years since quitting: 17.3  . Smokeless tobacco: Never Used  Substance and Sexual Activity  . Alcohol use: No  . Drug use: No  . Sexual activity: Not Currently  Lifestyle  . Physical activity    Days per week: Not on file    Minutes per session: Not on file  . Stress: Not on file  Relationships  . Social Herbalist on phone: Not on file    Gets together: Not on file    Attends religious service: Not on file    Active member of club or organization: Not on file    Attends meetings of clubs or organizations: Not on file    Relationship status: Not on file  . Intimate partner violence    Fear of current or ex partner: Not on file    Emotionally abused: Not on file    Physically abused: Not on file    Forced sexual activity: Not on file  Other Topics Concern  . Not on file  Social History Narrative  . Not on file  :   Review of systems: A comprehensive 14 point review of systems was negative except as noted in the HPI.  Exam: Patient Vitals for the past 24 hrs:  BP Temp Temp src Pulse Resp SpO2 Height Weight  12/04/18 0418 113/76 (!) 97.3 F (36.3 C) Oral 68 15 95 % - -  12/03/18 2130 114/72 98 F (36.7 C) Oral (!) 59  18 98 % - -  12/03/18 2108 - - - - - 95 % - -  12/03/18 1451 120/79 98 F (36.7 C) Oral (!) 59 16 100 % _0  (1.803 m) 184 lb 11.9 oz (83.8 kg)  12/03/18 0941 132/77 98.2 F (36.8 C) Oral 71 18 99 % - -  12/03/18 0904 127/81 - - 61 - - - -    General: Alert, no distress.  Has some temporal muscle wasting. Eyes:  no scleral icterus.    ENT:  There were no oropharyngeal lesions.   Neck was without thyromegaly.   Lymphatics:  Negative cervical, supraclavicular or axillary adenopathy.   Respiratory: lungs were clear bilaterally without wheezing or crackles.   Cardiovascular:  Regular rate and rhythm, S1/S2, without murmur, rub or gallop.  There was no pedal edema.  GI:  abdomen was soft, flat, nontender, nondistended, without organomegaly.   Muscoloskeletal: No motor strength in the lower extremities.  No decrease in strength in the upper extremities. Skin exam was without echymosis, petichae.   Neuro exam: alert and oriented x3.  Diminished sensation in the lower extremities from the feet up to the mid thighs.   Lab Results  Component Value Date   WBC 7.4 12/01/2018   HGB 14.0 12/01/2018   HCT 40.5 12/01/2018   PLT 207 12/01/2018   GLUCOSE 107 (H) 12/01/2018   CHOL 116 10/07/2018   TRIG 95 10/07/2018   HDL 38 (L) 10/07/2018   LDLCALC 59 10/07/2018   ALT 13 11/27/2018   AST 14 (L) 11/27/2018   NA 129 (L) 12/01/2018   K 4.3 12/01/2018   CL 96 (L) 12/01/2018   CREATININE 0.72 12/01/2018   BUN 27 (H) 12/01/2018   CO2 25 12/01/2018    Ct Chest W Contrast  Result Date: 12/02/2018 CLINICAL DATA:  Restaging lung cancer. EXAM: CT CHEST, ABDOMEN, AND PELVIS WITH CONTRAST TECHNIQUE: Multidetector CT imaging of the chest, abdomen and pelvis was performed following the standard protocol during bolus administration of intravenous contrast. CONTRAST:  142m OMNIPAQUE IOHEXOL 300 MG/ML  SOLN COMPARISON:  None. CT chest 07/10/2018 and CT AP from 05/24/2018. FINDINGS: CT CHEST FINDINGS Cardiovascular: Normal heart size. No pericardial effusion identified. Aortic atherosclerosis. Three vessel coronary artery atherosclerotic calcifications. Left chest wall pacer device is noted with lead in the right atrial appendage and coronary sinus. Mediastinum/Nodes: Normal appearance of the thyroid gland. The trachea appears patent and is  midline. Normal appearance of the esophagus. No mediastinal or hilar adenopathy. No axillary or supraclavicular adenopathy. Lungs/Pleura: No pleural effusion identified. Status post left upper lobectomy. Masslike architectural distortion within the perihilar left upper lobe adjacent to the suture line with associated loculated pleural thickening and fluid is unchanged from the previous exam. Within the superior segment of the right lower lobe there is a part solid nodule measuring 2.9 cm, image 53/4. Previously 3.3 cm. Adjacent architectural distortion and pleural thickening is similar. Postsurgical changes within the medial aspect of the right upper lung with suture line is unchanged. Purely ground-glass attenuating nodule within the anterior right middle lobe measures 1.0 cm, image 74/4. Unchanged. Part solid nodule within the anterolateral right lower lobe measures 5 cm, image 91/4. Stable from previous study. Calcified granuloma identified in the subpleural, posterior right lower lobe. No new pulmonary nodule or mass identified bilaterally. Musculoskeletal: Thoracic spondylosis. Unchanged displaced right lateral fifth rib deformity, image 33/3. No acute or suspicious osseous abnormality. CT ABDOMEN PELVIS FINDINGS Hepatobiliary: No focal liver abnormality is seen. No  gallstones, gallbladder wall thickening, or biliary dilatation. Pancreas: Unremarkable. No pancreatic ductal dilatation or surrounding inflammatory changes. Spleen: Normal in size without focal abnormality. Adrenals/Urinary Tract: Status post right adrenalectomy. Normal left adrenal gland. The kidneys are unremarkable. Thick walled urinary bladder is collapsed around a Foley catheter. Stomach/Bowel: Stomach is within normal limits. Appendix appears normal. No evidence of bowel wall thickening, distention, or inflammatory changes. Vascular/Lymphatic: Aortic atherosclerosis. No aneurysm identified. No pelvic or inguinal adenopathy. Reproductive:  Prostate gland enlargement. Other: No free fluid or fluid collections. Musculoskeletal: No acute or suspicious osseous abnormality. Lumbar degenerative disc disease. There are dysplastic changes involving the right femoral head with marked secondary degenerative changes. Moderate degenerative changes involve the left hip. IMPRESSION: 1. Stable exam. Similar appearance of post treatment changes within bilateral lungs without evidence for disease progression/recurrence. 2. Stable appearance of part solid nodules within the right lung. 3. Pure ground-glass attenuating nodule within the anterior right upper lobe is also unchanged. 4. Aortic Atherosclerosis (ICD10-I70.0) and Emphysema (ICD10-J43.9). 5. Coronary artery atherosclerotic calcifications. Electronically Signed   By: Kerby Moors M.D.   On: 12/02/2018 14:41   Ct Thoracic Spine W Contrast  Result Date: 11/26/2018 CLINICAL DATA:  73 year old male with grade 2 lower lumbar spondylolisthesis status post posterior decompression of the lumbar spine in May this year. Evidence of solid ankylosis at the L4-L5 spondylolisthesis level, however worsening weakness and neurologic symptoms since the time of surgery. History of lung cancer with multiple recurrences. FLUOROSCOPY TIME:  1 minutes 12 seconds PROCEDURE: LUMBAR PUNCTURE FOR THORACIC AND LUMBAR MYELOGRAM Lumbar puncture and intrathecal contrast administration were performed by Dr. Kristeen Miss who will separately report for the portion of the procedure. I personally supervised acquisition of the myelogram images. COMPARISON:  Postoperative lumbar spine CT 09/26/2018. Preoperative CT 07/10/2018. chest CT 07/10/2018. TECHNIQUE: Contiguous axial images were obtained through the Thoracic and Lumbar spine after the intrathecal infusion of infusion. Coronal and sagittal reconstructions were obtained of the axial image sets. FINDINGS: MYELOGRAM FINDINGS: Lumbar puncture was performed at the upper lumbar level. On the  initial postcontrast images there is 5-6 millimeter round filling defect occupying the left half of the thecal sac at the L3 vertebral body level. This became more difficult to see over the course of the myelographic images and will be reported in the lumbar CT below. Stable spondylolisthesis of L4 on L5. Stable vertebral height and alignment elsewhere including mild retrolisthesis of L1 on L2. Good lumbar intrathecal sac opacification, with no lumbar spinal stenosis from L2 to the sacrum. There is a defect on the ventral thecal sac at L1-L2 related to chronic disc osteophyte complex, appearing similar to that expected based on the July CT. A 2nd lateral view in mild Trendelenburg demonstrates contrast flow beyond this level, and normal appearing thecal sac patency at T12-L1. See additional post myelogram CT findings of the thoracic spine below. CT THORACIC MYELOGRAM FINDINGS: Decision was made following the acquisition of plain lumbar myelogram images to obtain CT post myelogram images also of the thoracic spine. Thoracic spine images were then obtained in a delayed fashion at 1241 hours (versus CT acquisition at 1133 hours of the lumbar spine. Only trace myelographic contrast is present in the thoracic spine, and seems to be mostly subdural space contrast. However, on axial series 7 images 68 through 110 there does appear to be a small amount of intrathecal thoracic contrast delineating a nonenlarged thoracic spinal cord from the T7 through the T10 level. Additionally the conus from T11-T12 continues  to have an unusual post myelographic appearance which seems unlikely just to the mixed contrast injection (over an hour delayed from the lumbar images). And furthermore there remains a nodular filling defect in the right thecal sac at T12 (in addition to the multifocal cauda equina nodularity described below). There is no evidence of degenerative thoracic spinal stenosis. No acute or suspicious osseous lesion is  identified in the thoracic spine. Persistent masslike opacity at the left hilum and in the visible posterior left upper lung pleural space. Partially visible persistent right upper lobe curvilinear confluent opacity on series 6, image 40. No new or increased mediastinal lymph nodes are identified. CT LUMBAR MYELOGRAM FINDINGS: Stable visible abdominal viscera including Calcified aortic atherosclerosis. And right adrenal region surgical clips. Stable posterior paraspinal soft tissues. No acute osseous abnormality identified. Stable posterior postoperative changes at L3-L4 and L4-L5. Solid interbody ankylosis redemonstrated at L4-L5. Intact visible sacrum and SI joints. T12-L1: The tip of the conus is at this level, above which there is only trace subdural intraspinal contrast. A filling defect in the right thecal sac on series 5, image 41 was initially felt probably related to the mixed injection surrounding the exiting right T12 nerve - although that seems less likely given the persistence on the delayed thoracic CT described above. No spinal stenosis at this level, minor disc bulging and endplate spurring appear stable since July. L1-L2: Chronic mild retrolisthesis with disc space loss and circumferential disc osteophyte complex. Mild spinal stenosis and mild to moderate left lateral recess stenosis (series 5, image 57). Moderate bilateral L1 foraminal stenosis. This level appears stable since July. L2-L3: Circumferential disc bulge and mild to moderate posterior element hypertrophy. No spinal stenosis. Borderline to mild left lateral recess stenosis. Mild to moderate left and borderline to mild right L2 foraminal stenosis. L3: Posterior to the L3 vertebral body to the left of midline there is a small 4-5 millimeter low-density filling defect associated with the cauda equina (series 5, image 80 and sagittal series 8, image 35), around which there is mildly hyperdense contrast, which appears similar to the  epidural/subdural contrast also in this region (see bilateral exiting L3 nerve roots). Just cephalad of this lesion and near the midline there is a subtle 2 millimeter area of thickening of the cauda equina (series 5, image 76). L3-L4: Posterior decompression of this level. Circumferential disc osteophyte complex and residual posterior element hypertrophy with widely patent thecal sac. Borderline to mild bilateral L3 foraminal stenosis. L4-L5: Chronic grade 2 spondylolisthesis with interbody ankylosis and posterior decompression. There is mildly irregular thickening of multiple cauda equina nerve roots also at this level (series 5, image 95) most of which are not displaced outwardly along the walls of the thecal sac. No spinal stenosis. No lateral recess stenosis. Moderate to severe bilateral L4 foraminal stenosis which is probably greater on the right. L5-S1:  Mild disc bulge and endplate spurring without stenosis. IMPRESSION: 1. 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 Mild chronic appearing lumbar spinal stenosis at L1-L2 is likely stable since that time, with decompressed L3-L4 and L4-L5 levels and no other significant lumbar spinal stenosis. 2. Non-specific thickening and nodularity of the cauda equina at the T12, L3, and L4 levels, with a superimposed unusual and bulbous appearance of the conus medullaris, which though initially felt related to a mixed contrast injection at the time of lumbar CT images (1133 hours) remained unchanged on delayed thoracic spine images at  1241 hours. And with no interval cephalad migration of intrathecal contrast, despite absent CT evidence of any thoracic spinal stenosis. 3. This constellation of clinical and imaging findings therefore raises the possibility of intrathecal metastatic disease with involvement of the conus and cauda equina. These studies were discussed by telephone with Dr. Kristeen Miss on 11/26/2018 at 13:20. Electronically Signed   By: Genevie Ann M.D.   On: 11/26/2018 13:25   Ct Lumbar Spine W Contrast  Result Date: 11/26/2018 CLINICAL DATA:  73 year old male with grade 2 lower lumbar spondylolisthesis status post posterior decompression of the lumbar spine in May this year. Evidence of solid ankylosis at the L4-L5 spondylolisthesis level, however worsening weakness and neurologic symptoms since the time of surgery. History of lung cancer with multiple recurrences. FLUOROSCOPY TIME:  1 minutes 12 seconds PROCEDURE: LUMBAR PUNCTURE FOR THORACIC AND LUMBAR MYELOGRAM Lumbar puncture and intrathecal contrast administration were performed by Dr. Kristeen Miss who will separately report for the portion of the procedure. I personally supervised acquisition of the myelogram images. COMPARISON:  Postoperative lumbar spine CT 09/26/2018. Preoperative CT 07/10/2018. chest CT 07/10/2018. TECHNIQUE: Contiguous axial images were obtained through the Thoracic and Lumbar spine after the intrathecal infusion of infusion. Coronal and sagittal reconstructions were obtained of the axial image sets. FINDINGS: MYELOGRAM FINDINGS: Lumbar puncture was performed at the upper lumbar level. On the initial postcontrast images there is 5-6 millimeter round filling defect occupying the left half of the thecal sac at the L3 vertebral body level. This became more difficult to see over the course of the myelographic images and will be reported in the lumbar CT below. Stable spondylolisthesis of L4 on L5. Stable vertebral height and alignment elsewhere including mild retrolisthesis of L1 on L2. Good lumbar intrathecal sac opacification, with no lumbar spinal stenosis from L2 to the sacrum. There is a defect on the ventral thecal sac at L1-L2 related to chronic disc osteophyte complex, appearing similar to that expected based on the July CT. A 2nd lateral view in mild Trendelenburg demonstrates contrast flow beyond this level,  and normal appearing thecal sac patency at T12-L1. See additional post myelogram CT findings of the thoracic spine below. CT THORACIC MYELOGRAM FINDINGS: Decision was made following the acquisition of plain lumbar myelogram images to obtain CT post myelogram images also of the thoracic spine. Thoracic spine images were then obtained in a delayed fashion at 1241 hours (versus CT acquisition at 1133 hours of the lumbar spine. Only trace myelographic contrast is present in the thoracic spine, and seems to be mostly subdural space contrast. However, on axial series 7 images 68 through 110 there does appear to be a small amount of intrathecal thoracic contrast delineating a nonenlarged thoracic spinal cord from the T7 through the T10 level. Additionally the conus from T11-T12 continues to have an unusual post myelographic appearance which seems unlikely just to the mixed contrast injection (over an hour delayed from the lumbar images). And furthermore there remains a nodular filling defect in the right thecal sac at T12 (in addition to the multifocal cauda equina nodularity described below). There is no evidence of degenerative thoracic spinal stenosis. No acute or suspicious osseous lesion is identified in the thoracic spine. Persistent masslike opacity at the left hilum and in the visible posterior left upper lung pleural space. Partially visible persistent right upper lobe curvilinear confluent opacity on series 6, image 40. No new or increased mediastinal lymph nodes are identified. CT LUMBAR MYELOGRAM FINDINGS: Stable visible abdominal viscera  including Calcified aortic atherosclerosis. And right adrenal region surgical clips. Stable posterior paraspinal soft tissues. No acute osseous abnormality identified. Stable posterior postoperative changes at L3-L4 and L4-L5. Solid interbody ankylosis redemonstrated at L4-L5. Intact visible sacrum and SI joints. T12-L1: The tip of the conus is at this level, above which there  is only trace subdural intraspinal contrast. A filling defect in the right thecal sac on series 5, image 41 was initially felt probably related to the mixed injection surrounding the exiting right T12 nerve - although that seems less likely given the persistence on the delayed thoracic CT described above. No spinal stenosis at this level, minor disc bulging and endplate spurring appear stable since July. L1-L2: Chronic mild retrolisthesis with disc space loss and circumferential disc osteophyte complex. Mild spinal stenosis and mild to moderate left lateral recess stenosis (series 5, image 57). Moderate bilateral L1 foraminal stenosis. This level appears stable since July. L2-L3: Circumferential disc bulge and mild to moderate posterior element hypertrophy. No spinal stenosis. Borderline to mild left lateral recess stenosis. Mild to moderate left and borderline to mild right L2 foraminal stenosis. L3: Posterior to the L3 vertebral body to the left of midline there is a small 4-5 millimeter low-density filling defect associated with the cauda equina (series 5, image 80 and sagittal series 8, image 35), around which there is mildly hyperdense contrast, which appears similar to the epidural/subdural contrast also in this region (see bilateral exiting L3 nerve roots). Just cephalad of this lesion and near the midline there is a subtle 2 millimeter area of thickening of the cauda equina (series 5, image 76). L3-L4: Posterior decompression of this level. Circumferential disc osteophyte complex and residual posterior element hypertrophy with widely patent thecal sac. Borderline to mild bilateral L3 foraminal stenosis. L4-L5: Chronic grade 2 spondylolisthesis with interbody ankylosis and posterior decompression. There is mildly irregular thickening of multiple cauda equina nerve roots also at this level (series 5, image 95) most of which are not displaced outwardly along the walls of the thecal sac. No spinal stenosis. No  lateral recess stenosis. Moderate to severe bilateral L4 foraminal stenosis which is probably greater on the right. L5-S1:  Mild disc bulge and endplate spurring without stenosis. IMPRESSION: 1. 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 Mild chronic appearing lumbar spinal stenosis at L1-L2 is likely stable since that time, with decompressed L3-L4 and L4-L5 levels and no other significant lumbar spinal stenosis. 2. Non-specific thickening and nodularity of the cauda equina at the T12, L3, and L4 levels, with a superimposed unusual and bulbous appearance of the conus medullaris, which though initially felt related to a mixed contrast injection at the time of lumbar CT images (1133 hours) remained unchanged on delayed thoracic spine images at 1241 hours. And with no interval cephalad migration of intrathecal contrast, despite absent CT evidence of any thoracic spinal stenosis. 3. This constellation of clinical and imaging findings therefore raises the possibility of intrathecal metastatic disease with involvement of the conus and cauda equina. These studies were discussed by telephone with Dr. Kristeen Miss on 11/26/2018 at 13:20. Electronically Signed   By: Genevie Ann M.D.   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 Brain staging. The patient has an MRI compatible cardiac pacemaker. EXAM:  MRI HEAD WITHOUT AND WITH CONTRAST TECHNIQUE: Multiplanar, multiecho pulse sequences of the brain and surrounding structures were obtained without and with intravenous contrast. CONTRAST:  48m GADAVIST GADOBUTROL 1 MMOL/ML IV SOLN COMPARISON:  Thoracic and lumbar MRI yesterday. FINDINGS: Brain: No restricted diffusion to suggest acute  infarction. No midline shift, mass effect, ventriculomegaly, extra-axial collection or acute intracranial hemorrhage. Cervicomedullary junction and pituitary are within normal limits. Prior to contrast gray and white matter signal is largely normal for age throughout the brain. 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 But otherwise only minimal nonspecific white matter T2 and FLAIR hyperintensity. Following contrast there is no abnormal intracranial enhancement identified. No leptomeningeal or dural thickening identified inside the skull. Also the visible cervical spinal cord to C3-C4 seems to remain normal. Vascular: The distal right vertebral artery flow void is lost on series 10, image 3. The left vertebral appears mildly dominant. The other Major intracranial vascular flow voids are preserved. The major dural venous sinuses are enhancing and appear to be patent. Skull and upper cervical spine: Visualized bone marrow signal is within normal limits. C3-C4 disc and endplate degeneration. Sinuses/Orbits: Negative orbits. Trace paranasal sinus mucosal thickening. Other: Mastoids are clear. Visible internal auditory structures appear normal. Right lateral convexity scalp lipoma incidentally noted on series 17, image 14. However, there is a nonspecific 12 millimeter heterogeneously enhancing scalp lesion located near the right temple on series 19, image 18 (note abnormal diffusion on series 7, image 56). IMPRESSION: 1. No metastatic disease to the brain or cerebral meninges is identified. 2. Possible 12 mm scalp soft tissue metastasis near the right temple (series 19, image 18). Alternatively this might be an inflamed proteinaceous dermal lesion. 3. No metastatic disease identified in the visible upper cervical spine. Electronically Signed   By: HGenevie AnnM.D.   On: 11/29/2018 16:05   Benjamin Thoracic Spine W Wo Contrast  Result Date: 11/28/2018 CLINICAL DATA:  Bilateral leg weakness. Abnormal CT  myelogram 11/26/2018. History of lung cancer. The patient has a pacemaker which is MRI compatible. EXAM: MRI THORACIC WITHOUT AND WITH CONTRAST TECHNIQUE: Multiplanar and multiecho pulse sequences of the thoracic spine were obtained without and with intravenous contrast. CONTRAST:  973mGADAVIST GADOBUTROL 1 MMOL/ML IV SOLN COMPARISON:  CT myelogram 11/26/2018 FINDINGS: MRI THORACIC SPINE FINDINGS Alignment:  Normal Vertebrae: Fatty bone marrow changes are present T3 through T7 compatible prior chest radiation for left-sided lung cancer. No thoracic fracture or metastatic disease to the spine. Cord: Expansile mass lesion in the conus medullaris at the T11 and T12 level. The mass measures 14 x 19 x 37 mm and appears intramedullary. There is significant cord edema above the mass. Cord edema continues into the mid and upper thoracic cord. Postcontrast images reveal multiple enhancing masses along the surface of the cord at T1, T2, T3, T5, and T6. Likely several other smaller lesions are also present. Enhancement extends into the left T3 neural foramina. Paraspinal and other soft tissues: Left upper lobe mass. No significant pleural effusion. Right upper lobe abnormality possibly metastatic disease. Disc levels: 2 mm anterolisthesis C7-T1 due to facet degeneration. Mild thoracic disc degeneration. Negative for disc protrusion. IMPRESSION: Enhancing mass lesion in the conus medullaris. Multiple small enhancing masses along the surface of the cord in the upper and midthoracic spine. 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 Recommend MRI of the brain without with contrast evaluate for metastatic disease. No evidence of bony metastatic disease These results  were called by telephone at the time of interpretation on 11/28/2018 at 2:35 pm to provider [facility] , who verbally acknowledged these results. Electronically Signed   By: Franchot Gallo M.D.   On: 11/28/2018 14:36   Benjamin Lumbar  Spine W Wo Contrast  Result Date: 11/28/2018 CLINICAL DATA:  Back pain. 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 History of lung cancer. Lumbar laminectomy 07/25/2018 L4-5 level. Abnormal myelogram 11/26/2018 EXAM: MRI LUMBAR SPINE WITHOUT AND WITH CONTRAST TECHNIQUE: Multiplanar and multiecho pulse sequences of the lumbar spine were obtained without and with intravenous contrast. CONTRAST:  78m GADAVIST GADOBUTROL 1 MMOL/ML IV SOLN COMPARISON:  Thoracic and lumbar myelogram 11/26/2018 FINDINGS: Segmentation:  Normal Alignment:  Mild retrolisthesis L1-2, 4 mm. 16 mm anterolisthesis L4-5 with severe disc degeneration at this level and bilateral pars defects of L4 Vertebrae: Negative for vertebral fracture or mass lesion. Small Schmorl's node with surrounding bone marrow edema at L1-2. Conus medullaris and cauda equina: Conus medullaris appears to terminate at T12-L1. There is and expansile mass lesion in the distal spinal cord and conus medullaris which shows heterogeneous signal intensity and enhancement. The mass measures approximately 14 x 9 mm on transverse images and extends over a 37 mm length. Small areas of hemorrhage are present within the mass. This accounts for the intradural mass identified on myelogram. There is edema in the cord above the mass. Paraspinal and other soft tissues: Negative for retroperitoneal mass or adenopathy. Urinary bladder is distended with Foley catheter. Disc levels: T12-L1: Mild facet degeneration.  Negative for stenosis L1-2: 4 mm retrolisthesis with disc and facet degeneration. Mild spinal stenosis and mild subarticular stenosis bilaterally L2-3: Diffuse bulging of the disc. Shallow foraminal disc protrusion bilaterally. Mild facet degeneration. Mild spinal stenosis and mild subarticular stenosis bilaterally L3-4: Diffuse disc bulging and mild facet degeneration. Negative for stenosis L4-5: 16 mm anterolisthesis with bilateral pars defects. Moderate  to severe foraminal encroachment bilaterally with impingement of the L4 nerve root bilaterally. L5-S1: Small central disc protrusion. Mild flattening of the right S1 nerve root. IMPRESSION: Enhancing mass involving the distal spinal cord and conus medullaris. This caused myelographic near complete block 2 days ago. 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 Review of the thoracic MRI demonstrates multiple enhancing masses along the surface of the cord in the thoracic spine, therefore metastatic disease is felt to be most likely. Negative for bony metastatic disease lumbar spine Grade 2 slip L4-5 due to bilateral pars defects of L4 resulting and moderate to severe foraminal encroachment bilaterally. These results were called by telephone at the time of interpretation on 11/28/2018 at 2:26 pm [facility]ally [facility] these results. Electronically Signed   By: CFranchot GalloM.D.   On: 11/28/2018 14:28   Ct Abdomen Pelvis W Contrast  Result Date: 12/02/2018 CLINICAL DATA:  Restaging lung cancer. EXAM: CT CHEST, ABDOMEN, AND PELVIS WITH CONTRAST TECHNIQUE: Multidetector CT imaging of the chest, abdomen and pelvis was performed following the standard protocol during bolus administration of intravenous contrast. CONTRAST:  1044mOMNIPAQUE IOHEXOL 300 MG/ML  SOLN COMPARISON:  None. CT chest 07/10/2018 and CT AP from 05/24/2018. FINDINGS: CT CHEST FINDINGS Cardiovascular: Normal heart size. No pericardial effusion identified. Aortic atherosclerosis. Three vessel coronary artery atherosclerotic calcifications. Left chest wall pacer device is noted with lead in the right atrial appendage and coronary sinus. Mediastinum/Nodes: Normal appearance of the thyroid gland. The trachea appears patent and is midline. Normal appearance of the esophagus. No  mediastinal or hilar adenopathy. No axillary or supraclavicular adenopathy. Lungs/Pleura: No pleural effusion identified. Status  post left upper lobectomy. Masslike architectural distortion within the perihilar left upper lobe adjacent to the suture line with associated loculated pleural thickening and fluid is unchanged from the previous exam. Within the superior segment of the right lower lobe there is a part solid nodule measuring 2.9 cm, image 53/4. Previously 3.3 cm. Adjacent architectural distortion and pleural thickening is similar. Postsurgical changes within the medial aspect of the right upper lung with suture line is unchanged. Purely ground-glass attenuating nodule within the anterior right middle lobe measures 1.0 cm, image 74/4. Unchanged. Part solid nodule within the anterolateral right lower lobe measures 5 cm, image 91/4. Stable from previous study. Calcified granuloma identified in the subpleural, posterior right lower lobe. No new pulmonary nodule or mass identified bilaterally. Musculoskeletal: Thoracic spondylosis. Unchanged displaced right lateral fifth rib deformity, image 33/3. No acute or suspicious osseous abnormality. CT ABDOMEN PELVIS FINDINGS Hepatobiliary: No focal liver abnormality is seen. No gallstones, gallbladder wall thickening, or biliary dilatation. Pancreas: Unremarkable. No pancreatic ductal dilatation or surrounding inflammatory changes. Spleen: Normal in size without focal abnormality. Adrenals/Urinary Tract: Status post right adrenalectomy. Normal left adrenal gland. The kidneys are unremarkable. Thick walled urinary bladder is collapsed around a Foley catheter. Stomach/Bowel: Stomach is within normal limits. Appendix appears normal. No evidence of bowel wall thickening, distention, or inflammatory changes. Vascular/Lymphatic: Aortic atherosclerosis. No aneurysm identified. No pelvic or inguinal adenopathy. Reproductive: Prostate gland enlargement. Other: No free fluid or fluid collections. Musculoskeletal: No acute or suspicious osseous abnormality. Lumbar degenerative disc disease. There are  dysplastic changes involving the right femoral head with marked secondary degenerative changes. Moderate degenerative changes involve the left hip. IMPRESSION: 1. Stable exam. Similar appearance of post treatment changes within bilateral lungs without evidence for disease progression/recurrence. 2. Stable appearance of part solid nodules within the right lung. 3. Pure ground-glass attenuating nodule within the anterior right upper lobe is also unchanged. 4. Aortic Atherosclerosis (ICD10-I70.0) and Emphysema (ICD10-J43.9). 5. Coronary artery atherosclerotic calcifications. Electronically Signed   By: Kerby Moors M.D.   On: 12/02/2018 14:41   Dg Myelogram Lumbar  Result Date: 11/26/2018 CLINICAL DATA:  73 year old male with grade 2 lower lumbar spondylolisthesis status post posterior decompression of the lumbar spine in May this year. Evidence of solid ankylosis at the L4-L5 spondylolisthesis level, however worsening weakness and neurologic symptoms since the time of surgery. History of lung cancer with multiple recurrences. FLUOROSCOPY TIME:  1 minutes 12 seconds PROCEDURE: LUMBAR PUNCTURE FOR THORACIC AND LUMBAR MYELOGRAM Lumbar puncture and intrathecal contrast administration were performed by Dr. Kristeen Miss who will separately report for the portion of the procedure. I personally supervised acquisition of the myelogram images. COMPARISON:  Postoperative lumbar spine CT 09/26/2018. Preoperative CT 07/10/2018. chest CT 07/10/2018. TECHNIQUE: Contiguous axial images were obtained through the Thoracic and Lumbar spine after the intrathecal infusion of infusion. Coronal and sagittal reconstructions were obtained of the axial image sets. FINDINGS: MYELOGRAM FINDINGS: Lumbar puncture was performed at the upper lumbar level. On the initial postcontrast images there is 5-6 millimeter round filling defect occupying the left half of the thecal sac at the L3 vertebral body level. This became more difficult to see over  the course of the myelographic images and will be reported in the lumbar CT below. Stable spondylolisthesis of L4 on L5. Stable vertebral height and alignment elsewhere including mild retrolisthesis of L1 on L2. Good lumbar intrathecal sac opacification, with no  lumbar spinal stenosis from L2 to the sacrum. There is a defect on the ventral thecal sac at L1-L2 related to chronic disc osteophyte complex, appearing similar to that expected based on the July CT. A 2nd lateral view in mild Trendelenburg demonstrates contrast flow beyond this level, and normal appearing thecal sac patency at T12-L1. See additional post myelogram CT findings of the thoracic spine below. CT THORACIC MYELOGRAM FINDINGS: Decision was made following the acquisition of plain lumbar myelogram images to obtain CT post myelogram images also of the thoracic spine. Thoracic spine images were then obtained in a delayed fashion at 1241 hours (versus CT acquisition at 1133 hours of the lumbar spine. Only trace myelographic contrast is present in the thoracic spine, and seems to be mostly subdural space contrast. However, on axial series 7 images 68 through 110 there does appear to be a small amount of intrathecal thoracic contrast delineating a nonenlarged thoracic spinal cord from the T7 through the T10 level. Additionally the conus from T11-T12 continues to have an unusual post myelographic appearance which seems unlikely just to the mixed contrast injection (over an hour delayed from the lumbar images). And furthermore there remains a nodular filling defect in the right thecal sac at T12 (in addition to the multifocal cauda equina nodularity described below). There is no evidence of degenerative thoracic spinal stenosis. No acute or suspicious osseous lesion is identified in the thoracic spine. Persistent masslike opacity at the left hilum and in the visible posterior left upper lung pleural space. Partially visible persistent right upper lobe  curvilinear confluent opacity on series 6, image 40. No new or increased mediastinal lymph nodes are identified. CT LUMBAR MYELOGRAM FINDINGS: Stable visible abdominal viscera including Calcified aortic atherosclerosis. And right adrenal region surgical clips. Stable posterior paraspinal soft tissues. No acute osseous abnormality identified. Stable posterior postoperative changes at L3-L4 and L4-L5. Solid interbody ankylosis redemonstrated at L4-L5. Intact visible sacrum and SI joints. T12-L1: The tip of the conus is at this level, above which there is only trace subdural intraspinal contrast. A filling defect in the right thecal sac on series 5, image 41 was initially felt probably related to the mixed injection surrounding the exiting right T12 nerve - although that seems less likely given the persistence on the delayed thoracic CT described above. No spinal stenosis at this level, minor disc bulging and endplate spurring appear stable since July. L1-L2: Chronic mild retrolisthesis with disc space loss and circumferential disc osteophyte complex. Mild spinal stenosis and mild to moderate left lateral recess stenosis (series 5, image 57). Moderate bilateral L1 foraminal stenosis. This level appears stable since July. L2-L3: Circumferential disc bulge and mild to moderate posterior element hypertrophy. No spinal stenosis. Borderline to mild left lateral recess stenosis. Mild to moderate left and borderline to mild right L2 foraminal stenosis. L3: Posterior to the L3 vertebral body to the left of midline there is a small 4-5 millimeter low-density filling defect associated with the cauda equina (series 5, image 80 and sagittal series 8, image 35), around which there is mildly hyperdense contrast, which appears similar to the epidural/subdural contrast also in this region (see bilateral exiting L3 nerve roots). Just cephalad of this lesion and near the midline there is a subtle 2 millimeter area of thickening of the  cauda equina (series 5, image 76). L3-L4: Posterior decompression of this level. Circumferential disc osteophyte complex and residual posterior element hypertrophy with widely patent thecal sac. Borderline to mild bilateral L3 foraminal stenosis. L4-L5: Chronic  grade 2 spondylolisthesis with interbody ankylosis and posterior decompression. There is mildly irregular thickening of multiple cauda equina nerve roots also at this level (series 5, image 95) most of which are not displaced outwardly along the walls of the thecal sac. No spinal stenosis. No lateral recess stenosis. Moderate to severe bilateral L4 foraminal stenosis which is probably greater on the right. L5-S1:  Mild disc bulge and endplate spurring without stenosis. IMPRESSION: 1. 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 Mild chronic appearing lumbar spinal stenosis at L1-L2 is likely stable since that time, with decompressed L3-L4 and L4-L5 levels and no other significant lumbar spinal stenosis. 2. Non-specific thickening and nodularity of the cauda equina at the T12, L3, and L4 levels, with a superimposed unusual and bulbous appearance of the conus medullaris, which though initially felt related to a mixed contrast injection at the time of lumbar CT images (1133 hours) remained unchanged on delayed thoracic spine images at 1241 hours. And with no interval cephalad migration of intrathecal contrast, despite absent CT evidence of any thoracic spinal stenosis. 3. This constellation of clinical and imaging findings therefore raises the possibility of intrathecal metastatic disease with involvement of the conus and cauda equina. These studies were discussed by telephone with Dr. Kristeen Miss on 11/26/2018 at 13:20. Electronically Signed   By: Genevie Ann M.D.   On: 11/26/2018 13:25   Vas [place] Lower Extremity Venous (dvt)  Result Date: 11/28/2018  Lower Venous Study Indications:  Edema.  Risk Factors: Cancer metastatic. Comparison Study: 05/17/24 bilateral lev negative Performing Technologist: Toma Copier RVS  Examination Guidelines: A complete evaluation includes B-mode imaging, spectral Doppler, color Doppler, and power Doppler as needed of all accessible portions of each vessel. Bilateral testing is considered an integral part of a complete examination. Limited examinations for reoccurring indications may be performed as noted.  +---------+---------------+---------+-----------+----------+-------------------+ RIGHT    CompressibilityPhasicitySpontaneityPropertiesThrombus Aging      +---------+---------------+---------+-----------+----------+-------------------+ CFV      Full           Yes      Yes                                      +---------+---------------+---------+-----------+----------+-------------------+ SFJ      Full                                                             +---------+---------------+---------+-----------+----------+-------------------+ FV Prox  Full           Yes      Yes                                      +---------+---------------+---------+-----------+----------+-------------------+ FV Mid   Full                                                             +---------+---------------+---------+-----------+----------+-------------------+ FV DistalFull  Yes      Yes                                      +---------+---------------+---------+-----------+----------+-------------------+ PFV      Full           Yes      Yes                                      +---------+---------------+---------+-----------+----------+-------------------+ POP                     Yes      Yes                  Patient unable to                                                         bend the knee for                                                         compression          +---------+---------------+---------+-----------+----------+-------------------+ PTV      Full                                                             +---------+---------------+---------+-----------+----------+-------------------+ PERO     Full                                                             +---------+---------------+---------+-----------+----------+-------------------+   +---------+---------------+---------+-----------+----------+--------------+ LEFT     CompressibilityPhasicitySpontaneityPropertiesThrombus Aging +---------+---------------+---------+-----------+----------+--------------+ CFV      Full           Yes      Yes                                 +---------+---------------+---------+-----------+----------+--------------+ SFJ      Full                                                        +---------+---------------+---------+-----------+----------+--------------+ FV Prox  Full           Yes      Yes                                 +---------+---------------+---------+-----------+----------+--------------+  FV Mid   Full                                                        +---------+---------------+---------+-----------+----------+--------------+ FV DistalFull           Yes      Yes                                 +---------+---------------+---------+-----------+----------+--------------+ PFV      Full           Yes      Yes                                 +---------+---------------+---------+-----------+----------+--------------+ POP      Full           Yes      Yes                                 +---------+---------------+---------+-----------+----------+--------------+ PTV      Full                                                        +---------+---------------+---------+-----------+----------+--------------+ PERO     Full                                                         +---------+---------------+---------+-----------+----------+--------------+     Summary: Right: There is no evidence of deep vein thrombosis in the lower extremity. No cystic structure found in the popliteal fossa. Left: There is no evidence of deep vein thrombosis in the lower extremity. No cystic structure found in the popliteal fossa.  *See table(s) above for measurements and observations. Electronically signed by Monica Martinez MD on 11/28/2018 at 3:19:13 PM.    Final    Ct Chest W Contrast  Result Date: 12/02/2018 CLINICAL DATA:  Restaging lung cancer. EXAM: CT CHEST, ABDOMEN, AND PELVIS WITH CONTRAST TECHNIQUE: Multidetector CT imaging of the chest, abdomen and pelvis was performed following the standard protocol during bolus administration of intravenous contrast. CONTRAST:  121m OMNIPAQUE IOHEXOL 300 MG/ML  SOLN COMPARISON:  None. CT chest 07/10/2018 and CT AP from 05/24/2018. FINDINGS: CT CHEST FINDINGS Cardiovascular: Normal heart size. No pericardial effusion identified. Aortic atherosclerosis. Three vessel coronary artery atherosclerotic calcifications. Left chest wall pacer device is noted with lead in the right atrial appendage and coronary sinus. Mediastinum/Nodes: Normal appearance of the thyroid gland. The trachea appears patent and is midline. Normal appearance of the esophagus. No mediastinal or hilar adenopathy. No axillary or supraclavicular adenopathy. Lungs/Pleura: No pleural effusion identified. Status post left upper lobectomy. Masslike architectural distortion within the perihilar left upper lobe adjacent to the suture line with associated loculated pleural thickening and fluid is unchanged from the previous exam. Within the superior segment of the right  lower lobe there is a part solid nodule measuring 2.9 cm, image 53/4. Previously 3.3 cm. Adjacent architectural distortion and pleural thickening is similar. Postsurgical changes within the medial aspect of the right upper lung with  suture line is unchanged. Purely ground-glass attenuating nodule within the anterior right middle lobe measures 1.0 cm, image 74/4. Unchanged. Part solid nodule within the anterolateral right lower lobe measures 5 cm, image 91/4. Stable from previous study. Calcified granuloma identified in the subpleural, posterior right lower lobe. No new pulmonary nodule or mass identified bilaterally. Musculoskeletal: Thoracic spondylosis. Unchanged displaced right lateral fifth rib deformity, image 33/3. No acute or suspicious osseous abnormality. CT ABDOMEN PELVIS FINDINGS Hepatobiliary: No focal liver abnormality is seen. No gallstones, gallbladder wall thickening, or biliary dilatation. Pancreas: Unremarkable. No pancreatic ductal dilatation or surrounding inflammatory changes. Spleen: Normal in size without focal abnormality. Adrenals/Urinary Tract: Status post right adrenalectomy. Normal left adrenal gland. The kidneys are unremarkable. Thick walled urinary bladder is collapsed around a Foley catheter. Stomach/Bowel: Stomach is within normal limits. Appendix appears normal. No evidence of bowel wall thickening, distention, or inflammatory changes. Vascular/Lymphatic: Aortic atherosclerosis. No aneurysm identified. No pelvic or inguinal adenopathy. Reproductive: Prostate gland enlargement. Other: No free fluid or fluid collections. Musculoskeletal: No acute or suspicious osseous abnormality. Lumbar degenerative disc disease. There are dysplastic changes involving the right femoral head with marked secondary degenerative changes. Moderate degenerative changes involve the left hip. IMPRESSION: 1. Stable exam. Similar appearance of post treatment changes within bilateral lungs without evidence for disease progression/recurrence. 2. Stable appearance of part solid nodules within the right lung. 3. Pure ground-glass attenuating nodule within the anterior right upper lobe is also unchanged. 4. Aortic Atherosclerosis (ICD10-I70.0)  and Emphysema (ICD10-J43.9). 5. Coronary artery atherosclerotic calcifications. Electronically Signed   By: Kerby Moors M.D.   On: 12/02/2018 14:41   Ct Thoracic Spine W Contrast  Result Date: 11/26/2018 CLINICAL DATA:  73 year old male with grade 2 lower lumbar spondylolisthesis status post posterior decompression of the lumbar spine in May this year. Evidence of solid ankylosis at the L4-L5 spondylolisthesis level, however worsening weakness and neurologic symptoms since the time of surgery. History of lung cancer with multiple recurrences. FLUOROSCOPY TIME:  1 minutes 12 seconds PROCEDURE: LUMBAR PUNCTURE FOR THORACIC AND LUMBAR MYELOGRAM Lumbar puncture and intrathecal contrast administration were performed by Dr. Kristeen Miss who will separately report for the portion of the procedure. I personally supervised acquisition of the myelogram images. COMPARISON:  Postoperative lumbar spine CT 09/26/2018. Preoperative CT 07/10/2018. chest CT 07/10/2018. TECHNIQUE: Contiguous axial images were obtained through the Thoracic and Lumbar spine after the intrathecal infusion of infusion. Coronal and sagittal reconstructions were obtained of the axial image sets. FINDINGS: MYELOGRAM FINDINGS: Lumbar puncture was performed at the upper lumbar level. On the initial postcontrast images there is 5-6 millimeter round filling defect occupying the left half of the thecal sac at the L3 vertebral body level. This became more difficult to see over the course of the myelographic images and will be reported in the lumbar CT below. Stable spondylolisthesis of L4 on L5. Stable vertebral height and alignment elsewhere including mild retrolisthesis of L1 on L2. Good lumbar intrathecal sac opacification, with no lumbar spinal stenosis from L2 to the sacrum. There is a defect on the ventral thecal sac at L1-L2 related to chronic disc osteophyte complex, appearing similar to that expected based on the July CT. A 2nd lateral view in mild  Trendelenburg demonstrates contrast flow beyond this level, and normal appearing  thecal sac patency at T12-L1. See additional post myelogram CT findings of the thoracic spine below. CT THORACIC MYELOGRAM FINDINGS: Decision was made following the acquisition of plain lumbar myelogram images to obtain CT post myelogram images also of the thoracic spine. Thoracic spine images were then obtained in a delayed fashion at 1241 hours (versus CT acquisition at 1133 hours of the lumbar spine. Only trace myelographic contrast is present in the thoracic spine, and seems to be mostly subdural space contrast. However, on axial series 7 images 68 through 110 there does appear to be a small amount of intrathecal thoracic contrast delineating a nonenlarged thoracic spinal cord from the T7 through the T10 level. Additionally the conus from T11-T12 continues to have an unusual post myelographic appearance which seems unlikely just to the mixed contrast injection (over an hour delayed from the lumbar images). And furthermore there remains a nodular filling defect in the right thecal sac at T12 (in addition to the multifocal cauda equina nodularity described below). There is no evidence of degenerative thoracic spinal stenosis. No acute or suspicious osseous lesion is identified in the thoracic spine. Persistent masslike opacity at the left hilum and in the visible posterior left upper lung pleural space. Partially visible persistent right upper lobe curvilinear confluent opacity on series 6, image 40. No new or increased mediastinal lymph nodes are identified. CT LUMBAR MYELOGRAM FINDINGS: Stable visible abdominal viscera including Calcified aortic atherosclerosis. And right adrenal region surgical clips. Stable posterior paraspinal soft tissues. No acute osseous abnormality identified. Stable posterior postoperative changes at L3-L4 and L4-L5. Solid interbody ankylosis redemonstrated at L4-L5. Intact visible sacrum and SI joints.  T12-L1: The tip of the conus is at this level, above which there is only trace subdural intraspinal contrast. A filling defect in the right thecal sac on series 5, image 41 was initially felt probably related to the mixed injection surrounding the exiting right T12 nerve - although that seems less likely given the persistence on the delayed thoracic CT described above. No spinal stenosis at this level, minor disc bulging and endplate spurring appear stable since July. L1-L2: Chronic mild retrolisthesis with disc space loss and circumferential disc osteophyte complex. Mild spinal stenosis and mild to moderate left lateral recess stenosis (series 5, image 57). Moderate bilateral L1 foraminal stenosis. This level appears stable since July. L2-L3: Circumferential disc bulge and mild to moderate posterior element hypertrophy. No spinal stenosis. Borderline to mild left lateral recess stenosis. Mild to moderate left and borderline to mild right L2 foraminal stenosis. L3: Posterior to the L3 vertebral body to the left of midline there is a small 4-5 millimeter low-density filling defect associated with the cauda equina (series 5, image 80 and sagittal series 8, image 35), around which there is mildly hyperdense contrast, which appears similar to the epidural/subdural contrast also in this region (see bilateral exiting L3 nerve roots). Just cephalad of this lesion and near the midline there is a subtle 2 millimeter area of thickening of the cauda equina (series 5, image 76). L3-L4: Posterior decompression of this level. Circumferential disc osteophyte complex and residual posterior element hypertrophy with widely patent thecal sac. Borderline to mild bilateral L3 foraminal stenosis. L4-L5: Chronic grade 2 spondylolisthesis with interbody ankylosis and posterior decompression. There is mildly irregular thickening of multiple cauda equina nerve roots also at this level (series 5, image 95) most of which are not displaced  outwardly along the walls of the thecal sac. No spinal stenosis. No lateral recess stenosis. Moderate to  severe bilateral L4 foraminal stenosis which is probably greater on the right. L5-S1:  Mild disc bulge and endplate spurring without stenosis. IMPRESSION: 1. 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 Mild chronic appearing lumbar spinal stenosis at L1-L2 is likely stable since that time, with decompressed L3-L4 and L4-L5 levels and no other significant lumbar spinal stenosis. 2. Non-specific thickening and nodularity of the cauda equina at the T12, L3, and L4 levels, with a superimposed unusual and bulbous appearance of the conus medullaris, which though initially felt related to a mixed contrast injection at the time of lumbar CT images (1133 hours) remained unchanged on delayed thoracic spine images at 1241 hours. And with no interval cephalad migration of intrathecal contrast, despite absent CT evidence of any thoracic spinal stenosis. 3. This constellation of clinical and imaging findings therefore raises the possibility of intrathecal metastatic disease with involvement of the conus and cauda equina. These studies were discussed by telephone with Dr. Kristeen Miss on 11/26/2018 at 13:20. Electronically Signed   By: Genevie Ann M.D.   On: 11/26/2018 13:25   Ct Lumbar Spine W Contrast  Result Date: 11/26/2018 CLINICAL DATA:  73 year old male with grade 2 lower lumbar spondylolisthesis status post posterior decompression of the lumbar spine in May this year. Evidence of solid ankylosis at the L4-L5 spondylolisthesis level, however worsening weakness and neurologic symptoms since the time of surgery. History of lung cancer with multiple recurrences. FLUOROSCOPY TIME:  1 minutes 12 seconds PROCEDURE: LUMBAR PUNCTURE FOR THORACIC AND LUMBAR MYELOGRAM Lumbar puncture and intrathecal contrast administration were performed by Dr. Kristeen Miss  who will separately report for the portion of the procedure. I personally supervised acquisition of the myelogram images. COMPARISON:  Postoperative lumbar spine CT 09/26/2018. Preoperative CT 07/10/2018. chest CT 07/10/2018. TECHNIQUE: Contiguous axial images were obtained through the Thoracic and Lumbar spine after the intrathecal infusion of infusion. Coronal and sagittal reconstructions were obtained of the axial image sets. FINDINGS: MYELOGRAM FINDINGS: Lumbar puncture was performed at the upper lumbar level. On the initial postcontrast images there is 5-6 millimeter round filling defect occupying the left half of the thecal sac at the L3 vertebral body level. This became more difficult to see over the course of the myelographic images and will be reported in the lumbar CT below. Stable spondylolisthesis of L4 on L5. Stable vertebral height and alignment elsewhere including mild retrolisthesis of L1 on L2. Good lumbar intrathecal sac opacification, with no lumbar spinal stenosis from L2 to the sacrum. There is a defect on the ventral thecal sac at L1-L2 related to chronic disc osteophyte complex, appearing similar to that expected based on the July CT. A 2nd lateral view in mild Trendelenburg demonstrates contrast flow beyond this level, and normal appearing thecal sac patency at T12-L1. See additional post myelogram CT findings of the thoracic spine below. CT THORACIC MYELOGRAM FINDINGS: Decision was made following the acquisition of plain lumbar myelogram images to obtain CT post myelogram images also of the thoracic spine. Thoracic spine images were then obtained in a delayed fashion at 1241 hours (versus CT acquisition at 1133 hours of the lumbar spine. Only trace myelographic contrast is present in the thoracic spine, and seems to be mostly subdural space contrast. However, on axial series 7 images 68 through 110 there does appear to be a small amount of intrathecal thoracic contrast delineating a  nonenlarged thoracic spinal cord from the T7 through the T10 level. Additionally  the conus from T11-T12 continues to have an unusual post myelographic appearance which seems unlikely just to the mixed contrast injection (over an hour delayed from the lumbar images). And furthermore there remains a nodular filling defect in the right thecal sac at T12 (in addition to the multifocal cauda equina nodularity described below). There is no evidence of degenerative thoracic spinal stenosis. No acute or suspicious osseous lesion is identified in the thoracic spine. Persistent masslike opacity at the left hilum and in the visible posterior left upper lung pleural space. Partially visible persistent right upper lobe curvilinear confluent opacity on series 6, image 40. No new or increased mediastinal lymph nodes are identified. CT LUMBAR MYELOGRAM FINDINGS: Stable visible abdominal viscera including Calcified aortic atherosclerosis. And right adrenal region surgical clips. Stable posterior paraspinal soft tissues. No acute osseous abnormality identified. Stable posterior postoperative changes at L3-L4 and L4-L5. Solid interbody ankylosis redemonstrated at L4-L5. Intact visible sacrum and SI joints. T12-L1: The tip of the conus is at this level, above which there is only trace subdural intraspinal contrast. A filling defect in the right thecal sac on series 5, image 41 was initially felt probably related to the mixed injection surrounding the exiting right T12 nerve - although that seems less likely given the persistence on the delayed thoracic CT described above. No spinal stenosis at this level, minor disc bulging and endplate spurring appear stable since July. L1-L2: Chronic mild retrolisthesis with disc space loss and circumferential disc osteophyte complex. Mild spinal stenosis and mild to moderate left lateral recess stenosis (series 5, image 57). Moderate bilateral L1 foraminal stenosis. This level appears stable since  July. L2-L3: Circumferential disc bulge and mild to moderate posterior element hypertrophy. No spinal stenosis. Borderline to mild left lateral recess stenosis. Mild to moderate left and borderline to mild right L2 foraminal stenosis. L3: Posterior to the L3 vertebral body to the left of midline there is a small 4-5 millimeter low-density filling defect associated with the cauda equina (series 5, image 80 and sagittal series 8, image 35), around which there is mildly hyperdense contrast, which appears similar to the epidural/subdural contrast also in this region (see bilateral exiting L3 nerve roots). Just cephalad of this lesion and near the midline there is a subtle 2 millimeter area of thickening of the cauda equina (series 5, image 76). L3-L4: Posterior decompression of this level. Circumferential disc osteophyte complex and residual posterior element hypertrophy with widely patent thecal sac. Borderline to mild bilateral L3 foraminal stenosis. L4-L5: Chronic grade 2 spondylolisthesis with interbody ankylosis and posterior decompression. There is mildly irregular thickening of multiple cauda equina nerve roots also at this level (series 5, image 95) most of which are not displaced outwardly along the walls of the thecal sac. No spinal stenosis. No lateral recess stenosis. Moderate to severe bilateral L4 foraminal stenosis which is probably greater on the right. L5-S1:  Mild disc bulge and endplate spurring without stenosis. IMPRESSION: 1. 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 Mild chronic appearing lumbar spinal stenosis at L1-L2 is likely stable since that time, with decompressed L3-L4 and L4-L5 levels and no other significant lumbar spinal stenosis. 2. Non-specific thickening and nodularity of the cauda equina at the T12, L3, and L4 levels, with a superimposed unusual and bulbous appearance of the conus medullaris, which though  initially felt related to a mixed contrast injection at the time of lumbar CT images (1133 hours) remained unchanged on  delayed thoracic spine images at 1241 hours. And with no interval cephalad migration of intrathecal contrast, despite absent CT evidence of any thoracic spinal stenosis. 3. This constellation of clinical and imaging findings therefore raises the possibility of intrathecal metastatic disease with involvement of the conus and cauda equina. These studies were discussed by telephone with Dr. Kristeen Miss on 11/26/2018 at 13:20. Electronically Signed   By: Genevie Ann M.D.   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 Brain staging. The patient has an MRI compatible cardiac pacemaker. EXAM: MRI HEAD WITHOUT AND WITH CONTRAST TECHNIQUE: Multiplanar, multiecho pulse sequences of the brain and surrounding structures were obtained without and with intravenous contrast. CONTRAST:  61m GADAVIST GADOBUTROL 1 MMOL/ML IV SOLN COMPARISON:  Thoracic and lumbar MRI yesterday. FINDINGS: Brain: No restricted diffusion to suggest acute infarction. No midline shift, mass effect, ventriculomegaly, extra-axial collection or acute intracranial hemorrhage. Cervicomedullary junction and pituitary are within normal limits. Prior to contrast gray and white matter signal is largely normal for age throughout the brain. 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 But otherwise only minimal nonspecific white matter T2 and FLAIR hyperintensity. Following contrast there is no abnormal intracranial enhancement identified. No leptomeningeal or dural thickening identified inside the skull. Also the visible cervical spinal cord to C3-C4 seems to remain normal. Vascular: The distal right vertebral  artery flow void is lost on series 10, image 3. The left vertebral appears mildly dominant. The other Major intracranial vascular flow voids are preserved. The major dural venous sinuses are enhancing and appear to be patent. Skull and upper cervical spine: Visualized bone marrow signal is within normal limits. C3-C4 disc and endplate degeneration. Sinuses/Orbits: Negative orbits. Trace paranasal sinus mucosal thickening. Other: Mastoids are clear. Visible internal auditory structures appear normal. Right lateral convexity scalp lipoma incidentally noted on series 17, image 14. However, there is a nonspecific 12 millimeter heterogeneously enhancing scalp lesion located near the right temple on series 19, image 18 (note abnormal diffusion on series 7, image 56). IMPRESSION: 1. No metastatic disease to the brain or cerebral meninges is identified. 2. Possible 12 mm scalp soft tissue metastasis near the right temple (series 19, image 18). Alternatively this might be an inflamed proteinaceous dermal lesion. 3. No metastatic disease identified in the visible upper cervical spine. Electronically Signed   By: HGenevie AnnM.D.   On: 11/29/2018 16:05   [facility] Thoracic Spine W Wo Contrast  Result Date: 11/28/2018 CLINICAL DATA:  Bilateral leg weakness. Abnormal CT myelogram 11/26/2018. History of lung cancer. The patient has a pacemaker which is MRI compatible. EXAM: MRI THORACIC WITHOUT AND WITH CONTRAST TECHNIQUE: Multiplanar and multiecho pulse sequences of the thoracic spine were obtained without and with intravenous contrast. CONTRAST:  928mGADAVIST GADOBUTROL 1 MMOL/ML IV SOLN COMPARISON:  CT myelogram 11/26/2018 FINDINGS: MRI THORACIC SPINE FINDINGS Alignment:  Normal Vertebrae: Fatty bone marrow changes are present T3 through T7 compatible prior chest radiation for left-sided lung cancer. No thoracic fracture or metastatic disease to the spine. Cord: Expansile mass lesion in the conus medullaris at the T11 and T12 level.  The mass measures 14 x 19 x 37 mm and appears intramedullary. There is significant cord edema above the mass. Cord edema continues into the mid and upper thoracic cord. Postcontrast images reveal multiple  enhancing masses along the surface of the cord at T1, T2, T3, T5, and T6. Likely several other smaller lesions are also present. Enhancement extends into the left T3 neural foramina. Paraspinal and other soft tissues: Left upper lobe mass. No significant pleural effusion. Right upper lobe abnormality possibly metastatic disease. Disc levels: 2 mm anterolisthesis C7-T1 due to facet degeneration. Mild thoracic disc degeneration. Negative for disc protrusion. IMPRESSION: Enhancing mass lesion in the conus medullaris. Multiple small enhancing masses along the surface of the cord in the upper and midthoracic spine. 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 Recommend MRI of the brain without with contrast evaluate for metastatic disease. No evidence of bony metastatic disease These results were called by telephone at the time of interpretation on 11/28/2018 at 2:35 pm to provider [facility] , who verbally acknowledged these results. Electronically Signed   By: Franchot Gallo M.D.   On: 11/28/2018 14:36   Benjamin Lumbar Spine W Wo Contrast  Result Date: 11/28/2018 CLINICAL DATA:  Back pain. 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 History of lung cancer. Lumbar laminectomy 07/25/2018 L4-5 level. Abnormal myelogram 11/26/2018 EXAM: MRI LUMBAR SPINE WITHOUT AND WITH CONTRAST TECHNIQUE: Multiplanar and multiecho pulse sequences of the lumbar spine were obtained without and with intravenous contrast. CONTRAST:  66m GADAVIST GADOBUTROL 1 MMOL/ML IV SOLN COMPARISON:  Thoracic and lumbar myelogram 11/26/2018 FINDINGS: Segmentation:  Normal Alignment:  Mild retrolisthesis L1-2, 4 mm. 16 mm anterolisthesis L4-5 with severe disc degeneration at this level and  bilateral pars defects of L4 Vertebrae: Negative for vertebral fracture or mass lesion. Small Schmorl's node with surrounding bone marrow edema at L1-2. Conus medullaris and cauda equina: Conus medullaris appears to terminate at T12-L1. There is and expansile mass lesion in the distal spinal cord and conus medullaris which shows heterogeneous signal intensity and enhancement. The mass measures approximately 14 x 9 mm on transverse images and extends over a 37 mm length. Small areas of hemorrhage are present within the mass. This accounts for the intradural mass identified on myelogram. There is edema in the cord above the mass. Paraspinal and other soft tissues: Negative for retroperitoneal mass or adenopathy. Urinary bladder is distended with Foley catheter. Disc levels: T12-L1: Mild facet degeneration.  Negative for stenosis L1-2: 4 mm retrolisthesis with disc and facet degeneration. Mild spinal stenosis and mild subarticular stenosis bilaterally L2-3: Diffuse bulging of the disc. Shallow foraminal disc protrusion bilaterally. Mild facet degeneration. Mild spinal stenosis and mild subarticular stenosis bilaterally L3-4: Diffuse disc bulging and mild facet degeneration. Negative for stenosis L4-5: 16 mm anterolisthesis with bilateral pars defects. Moderate to severe foraminal encroachment bilaterally with impingement of the L4 nerve root bilaterally. L5-S1: Small central disc protrusion. Mild flattening of the right S1 nerve root. IMPRESSION: Enhancing mass involving the distal spinal cord and conus medullaris. This caused myelographic near complete block 2 days ago. 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 Review of the thoracic MRI demonstrates multiple enhancing masses along the surface of the cord in the thoracic spine, therefore metastatic disease is felt to be most likely. Negative for bony metastatic disease lumbar spine Grade 2 slip L4-5 due to bilateral pars defects of L4 resulting  and moderate to severe foraminal encroachment bilaterally. These results were called by telephone at the time of interpretation on 11/28/2018 at 2:26 pm [facility] who verbally acknowledged these results. Electronically Signed   By: CFranchot GalloM.D.   On: 11/28/2018 14:28  Ct Abdomen Pelvis W Contrast  Result Date: 12/02/2018 CLINICAL DATA:  Restaging lung cancer. EXAM: CT CHEST, ABDOMEN, AND PELVIS WITH CONTRAST TECHNIQUE: Multidetector CT imaging of the chest, abdomen and pelvis was performed following the standard protocol during bolus administration of intravenous contrast. CONTRAST:  18m OMNIPAQUE IOHEXOL 300 MG/ML  SOLN COMPARISON:  None. CT chest 07/10/2018 and CT AP from 05/24/2018. FINDINGS: CT CHEST FINDINGS Cardiovascular: Normal heart size. No pericardial effusion identified. Aortic atherosclerosis. Three vessel coronary artery atherosclerotic calcifications. Left chest wall pacer device is noted with lead in the right atrial appendage and coronary sinus. Mediastinum/Nodes: Normal appearance of the thyroid gland. The trachea appears patent and is midline. Normal appearance of the esophagus. No mediastinal or hilar adenopathy. No axillary or supraclavicular adenopathy. Lungs/Pleura: No pleural effusion identified. Status post left upper lobectomy. Masslike architectural distortion within the perihilar left upper lobe adjacent to the suture line with associated loculated pleural thickening and fluid is unchanged from the previous exam. Within the superior segment of the right lower lobe there is a part solid nodule measuring 2.9 cm, image 53/4. Previously 3.3 cm. Adjacent architectural distortion and pleural thickening is similar. Postsurgical changes within the medial aspect of the right upper lung with suture line is unchanged. Purely ground-glass attenuating nodule within the anterior right middle lobe measures 1.0 cm, image 74/4. Unchanged. Part solid nodule within the  anterolateral right lower lobe measures 5 cm, image 91/4. Stable from previous study. Calcified granuloma identified in the subpleural, posterior right lower lobe. No new pulmonary nodule or mass identified bilaterally. Musculoskeletal: Thoracic spondylosis. Unchanged displaced right lateral fifth rib deformity, image 33/3. No acute or suspicious osseous abnormality. CT ABDOMEN PELVIS FINDINGS Hepatobiliary: No focal liver abnormality is seen. No gallstones, gallbladder wall thickening, or biliary dilatation. Pancreas: Unremarkable. No pancreatic ductal dilatation or surrounding inflammatory changes. Spleen: Normal in size without focal abnormality. Adrenals/Urinary Tract: Status post right adrenalectomy. Normal left adrenal gland. The kidneys are unremarkable. Thick walled urinary bladder is collapsed around a Foley catheter. Stomach/Bowel: Stomach is within normal limits. Appendix appears normal. No evidence of bowel wall thickening, distention, or inflammatory changes. Vascular/Lymphatic: Aortic atherosclerosis. No aneurysm identified. No pelvic or inguinal adenopathy. Reproductive: Prostate gland enlargement. Other: No free fluid or fluid collections. Musculoskeletal: No acute or suspicious osseous abnormality. Lumbar degenerative disc disease. There are dysplastic changes involving the right femoral head with marked secondary degenerative changes. Moderate degenerative changes involve the left hip. IMPRESSION: 1. Stable exam. Similar appearance of post treatment changes within bilateral lungs without evidence for disease progression/recurrence. 2. Stable appearance of part solid nodules within the right lung. 3. Pure ground-glass attenuating nodule within the anterior right upper lobe is also unchanged. 4. Aortic Atherosclerosis (ICD10-I70.0) and Emphysema (ICD10-J43.9). 5. Coronary artery atherosclerotic calcifications. Electronically Signed   By: TKerby MoorsM.D.   On: 12/02/2018 14:41   Dg Myelogram  Lumbar  Result Date: 11/26/2018 CLINICAL DATA:  73year old male with grade 2 lower lumbar spondylolisthesis status post posterior decompression of the lumbar spine in May this year. Evidence of solid ankylosis at the L4-L5 spondylolisthesis level, however worsening weakness and neurologic symptoms since the time of surgery. History of lung cancer with multiple recurrences. FLUOROSCOPY TIME:  1 minutes 12 seconds PROCEDURE: LUMBAR PUNCTURE FOR THORACIC AND LUMBAR MYELOGRAM Lumbar puncture and intrathecal contrast administration were performed by Dr. HKristeen Misswho will separately report for the portion of the procedure. I personally supervised acquisition of the myelogram images. COMPARISON:  Postoperative lumbar spine CT 09/26/2018.  Preoperative CT 07/10/2018. chest CT 07/10/2018. TECHNIQUE: Contiguous axial images were obtained through the Thoracic and Lumbar spine after the intrathecal infusion of infusion. Coronal and sagittal reconstructions were obtained of the axial image sets. FINDINGS: MYELOGRAM FINDINGS: Lumbar puncture was performed at the upper lumbar level. On the initial postcontrast images there is 5-6 millimeter round filling defect occupying the left half of the thecal sac at the L3 vertebral body level. This became more difficult to see over the course of the myelographic images and will be reported in the lumbar CT below. Stable spondylolisthesis of L4 on L5. Stable vertebral height and alignment elsewhere including mild retrolisthesis of L1 on L2. Good lumbar intrathecal sac opacification, with no lumbar spinal stenosis from L2 to the sacrum. There is a defect on the ventral thecal sac at L1-L2 related to chronic disc osteophyte complex, appearing similar to that expected based on the July CT. A 2nd lateral view in mild Trendelenburg demonstrates contrast flow beyond this level, and normal appearing thecal sac patency at T12-L1. See additional post myelogram CT findings of the thoracic spine  below. CT THORACIC MYELOGRAM FINDINGS: Decision was made following the acquisition of plain lumbar myelogram images to obtain CT post myelogram images also of the thoracic spine. Thoracic spine images were then obtained in a delayed fashion at 1241 hours (versus CT acquisition at 1133 hours of the lumbar spine. Only trace myelographic contrast is present in the thoracic spine, and seems to be mostly subdural space contrast. However, on axial series 7 images 68 through 110 there does appear to be a small amount of intrathecal thoracic contrast delineating a nonenlarged thoracic spinal cord from the T7 through the T10 level. Additionally the conus from T11-T12 continues to have an unusual post myelographic appearance which seems unlikely just to the mixed contrast injection (over an hour delayed from the lumbar images). And furthermore there remains a nodular filling defect in the right thecal sac at T12 (in addition to the multifocal cauda equina nodularity described below). There is no evidence of degenerative thoracic spinal stenosis. No acute or suspicious osseous lesion is identified in the thoracic spine. Persistent masslike opacity at the left hilum and in the visible posterior left upper lung pleural space. Partially visible persistent right upper lobe curvilinear confluent opacity on series 6, image 40. No new or increased mediastinal lymph nodes are identified. CT LUMBAR MYELOGRAM FINDINGS: Stable visible abdominal viscera including Calcified aortic atherosclerosis. And right adrenal region surgical clips. Stable posterior paraspinal soft tissues. No acute osseous abnormality identified. Stable posterior postoperative changes at L3-L4 and L4-L5. Solid interbody ankylosis redemonstrated at L4-L5. Intact visible sacrum and SI joints. T12-L1: The tip of the conus is at this level, above which there is only trace subdural intraspinal contrast. A filling defect in the right thecal sac on series 5, image 41 was  initially felt probably related to the mixed injection surrounding the exiting right T12 nerve - although that seems less likely given the persistence on the delayed thoracic CT described above. No spinal stenosis at this level, minor disc bulging and endplate spurring appear stable since July. L1-L2: Chronic mild retrolisthesis with disc space loss and circumferential disc osteophyte complex. Mild spinal stenosis and mild to moderate left lateral recess stenosis (series 5, image 57). Moderate bilateral L1 foraminal stenosis. This level appears stable since July. L2-L3: Circumferential disc bulge and mild to moderate posterior element hypertrophy. No spinal stenosis. Borderline to mild left lateral recess stenosis. Mild to moderate left and borderline  to mild right L2 foraminal stenosis. L3: Posterior to the L3 vertebral body to the left of midline there is a small 4-5 millimeter low-density filling defect associated with the cauda equina (series 5, image 80 and sagittal series 8, image 35), around which there is mildly hyperdense contrast, which appears similar to the epidural/subdural contrast also in this region (see bilateral exiting L3 nerve roots). Just cephalad of this lesion and near the midline there is a subtle 2 millimeter area of thickening of the cauda equina (series 5, image 76). L3-L4: Posterior decompression of this level. Circumferential disc osteophyte complex and residual posterior element hypertrophy with widely patent thecal sac. Borderline to mild bilateral L3 foraminal stenosis. L4-L5: Chronic grade 2 spondylolisthesis with interbody ankylosis and posterior decompression. There is mildly irregular thickening of multiple cauda equina nerve roots also at this level (series 5, image 95) most of which are not displaced outwardly along the walls of the thecal sac. No spinal stenosis. No lateral recess stenosis. Moderate to severe bilateral L4 foraminal stenosis which is probably greater on the  right. L5-S1:  Mild disc bulge and endplate spurring without stenosis. IMPRESSION: 1. 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 Mild chronic appearing lumbar spinal stenosis at L1-L2 is likely stable since that time, with decompressed L3-L4 and L4-L5 levels and no other significant lumbar spinal stenosis. 2. Non-specific thickening and nodularity of the cauda equina at the T12, L3, and L4 levels, with a superimposed unusual and bulbous appearance of the conus medullaris, which though initially felt related to a mixed contrast injection at the time of lumbar CT images (1133 hours) remained unchanged on delayed thoracic spine images at 1241 hours. And with no interval cephalad migration of intrathecal contrast, despite absent CT evidence of any thoracic spinal stenosis. 3. This constellation of clinical and imaging findings therefore raises the possibility of intrathecal metastatic disease with involvement of the conus and cauda equina. These studies were discussed by telephone with Dr. Kristeen Miss on 11/26/2018 at 13:20. Electronically Signed   By: Genevie Ann M.D.   On: 11/26/2018 13:25   Vas [place] Lower Extremity Venous (dvt)  Result Date: 11/28/2018  Lower Venous Study Indications: Edema.  Risk Factors: Cancer metastatic. Comparison Study: 05/17/24 bilateral lev negative Performing Technologist: Toma Copier RVS  Examination Guidelines: A complete evaluation includes B-mode imaging, spectral Doppler, color Doppler, and power Doppler as needed of all accessible portions of each vessel. Bilateral testing is considered an integral part of a complete examination. Limited examinations for reoccurring indications may be performed as noted.  +---------+---------------+---------+-----------+----------+-------------------+ RIGHT    CompressibilityPhasicitySpontaneityPropertiesThrombus Aging       +---------+---------------+---------+-----------+----------+-------------------+ CFV      Full           Yes      Yes                                      +---------+---------------+---------+-----------+----------+-------------------+ SFJ      Full                                                             +---------+---------------+---------+-----------+----------+-------------------+ FV Prox  Full  Yes      Yes                                      +---------+---------------+---------+-----------+----------+-------------------+ FV Mid   Full                                                             +---------+---------------+---------+-----------+----------+-------------------+ FV DistalFull           Yes      Yes                                      +---------+---------------+---------+-----------+----------+-------------------+ PFV      Full           Yes      Yes                                      +---------+---------------+---------+-----------+----------+-------------------+ POP                     Yes      Yes                  Patient unable to                                                         bend the knee for                                                         compression         +---------+---------------+---------+-----------+----------+-------------------+ PTV      Full                                                             +---------+---------------+---------+-----------+----------+-------------------+ PERO     Full                                                             +---------+---------------+---------+-----------+----------+-------------------+   +---------+---------------+---------+-----------+----------+--------------+ LEFT     CompressibilityPhasicitySpontaneityPropertiesThrombus Aging +---------+---------------+---------+-----------+----------+--------------+ CFV      Full            Yes      Yes                                 +---------+---------------+---------+-----------+----------+--------------+  SFJ      Full                                                        +---------+---------------+---------+-----------+----------+--------------+ FV Prox  Full           Yes      Yes                                 +---------+---------------+---------+-----------+----------+--------------+ FV Mid   Full                                                        +---------+---------------+---------+-----------+----------+--------------+ FV DistalFull           Yes      Yes                                 +---------+---------------+---------+-----------+----------+--------------+ PFV      Full           Yes      Yes                                 +---------+---------------+---------+-----------+----------+--------------+ POP      Full           Yes      Yes                                 +---------+---------------+---------+-----------+----------+--------------+ PTV      Full                                                        +---------+---------------+---------+-----------+----------+--------------+ PERO     Full                                                        +---------+---------------+---------+-----------+----------+--------------+     Summary: Right: There is no evidence of deep vein thrombosis in the lower extremity. No cystic structure found in the popliteal fossa. Left: There is no evidence of deep vein thrombosis in the lower extremity. No cystic structure found in the popliteal fossa.  *See table(s) above for measurements and observations. Electronically signed by Monica Martinez MD on 11/28/2018 at 3:19:13 PM.    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  2. 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   3. 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  4. 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 A re-staging PET scan June 28, 2009, showed no evidence for disease progression. CT of the chest 21-Oct-202012 showed no evidence of disease progression. CT chest 07/07/2014 revealed no evidence of recurrent lung cancer  -Prior radiation summary    - 06/27/2017-04/19/2019SBRT Treatment: The staple line lesion in the left lungwas   treated to 50Gy in 8factions of 10Gy. The RLL lesion was treated to 54 Gy in 3 fractions   of 18 Gy.  - 09/20/15-7/10/17SBRT Treatment:  Right lower lobe was treated to 54 Gy in 3 fractions of  18 Gy.  -06/03/07- 07/05/07: 62.5 Gy in 25 fractions of 2.5 Gy to an inferior left upper lobe target  -Foundation 1 testing (06/13/2017)  -No reportable alterations; [id number] NF1 splice site [id number]>J POLE [id number],  RAD21 amplification, KRAS G12C, MSS, tumor mutation burden 9 5. History of questionable small bilateral axillary lymph nodes.  6. History of bradycardia - followed by Dr NAcie Fredrickson  7. Chronic exertional dyspnea following lung surgery.  8. Chronic right "hip" discomfort.  9. history of mild thrombocytopenia-followed by Dr. SFelipa Eth 10. Coronary artery disease, status post a myocardial infarction in August of 2014 11.  Hospital admission 11/26/2018 cauda equina syndrome  Benjamin [name] a 73year old male with a history of lung cancer.  He has received radiation to his lung on several occasions as listed above.  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  Now receiving radiation to his spine.  CT of the chest, abdomen, pelvis does not show obvious evidence of systemic disease.  Recommendations: 1.  Discussed with the patient results of imaging.  Made patient aware that his neurological  symptoms will likely not improve even with treatment.  He understands this. 2.  Continue radiation to the spine as outlined by radiation oncology. 3.  The patient may need a lumbar puncture for cytology or possibly a biopsy by neurosurgery at some point in the near future to confirm diagnosis.   Thank you for this referral.   KMikey Bussing DNP, AGPCNP-BC, AOCNP  Benjamin [name] known to me with a past history of non-small cell lung cancer.  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  The CNS metastases are likely related to lung cancer, though it is possible he has developed metastatic melanoma or another malignancy.  He is completing palliative radiation to the spine.  He understands the chance of neurologic recovery is small.  Hopefully the radiation will help with pain control.  I will discuss the indication for obtaining a CSF cytology or biopsy of the cauda lesion with the treating team.  Molecular testing on the in situ right lower lung carcinoma in 2017 revealed a KRAS G12C alteration for which there is now a targeted therapy.  I doubt he will be a candidate for systemic therapy.  I will recommend Hospice care if he does not undergo a diagnostic biopsy.

## 2018-12-05 ENCOUNTER — Ambulatory Visit
Admit: 2018-12-05 | Discharge: 2018-12-05 | Disposition: A | Discharge status: Home / Self Care | Payer: Medicare Other | Attending: Radiation Oncology | Admitting: Radiation Oncology

## 2018-12-05 DIAGNOSIS — I2510 Atherosclerotic heart disease of native coronary artery without angina pectoris: Secondary | ICD-10-CM

## 2018-12-05 DIAGNOSIS — Z7189 Other specified counseling: Secondary | ICD-10-CM

## 2018-12-05 LAB — CBC
HCT: 42 % (ref 39.0–52.0)
Hemoglobin: 13.9 g/dL (ref 13.0–17.0)
MCH: 29.2 pg (ref 26.0–34.0)
MCHC: 33.1 g/dL (ref 30.0–36.0)
MCV: 88.2 fL (ref 80.0–100.0)
Platelets: 179 10*3/uL (ref 150–400)
RBC: 4.76 MIL/uL (ref 4.22–5.81)
RDW: 14 % (ref 11.5–15.5)
WBC: 7.9 10*3/uL (ref 4.0–10.5)
nRBC: 0 % (ref 0.0–0.2)

## 2018-12-05 LAB — COMPREHENSIVE METABOLIC PANEL
ALT: 15 U/L (ref 0–44)
AST: 14 U/L — ABNORMAL LOW (ref 15–41)
Albumin: 3.3 g/dL — ABNORMAL LOW (ref 3.5–5.0)
Alkaline Phosphatase: 90 U/L (ref 38–126)
Anion gap: 7 (ref 5–15)
BUN: 30 mg/dL — ABNORMAL HIGH (ref 8–23)
CO2: 27 mmol/L (ref 22–32)
Calcium: 8.7 mg/dL — ABNORMAL LOW (ref 8.9–10.3)
Chloride: 93 mmol/L — ABNORMAL LOW (ref 98–111)
Creatinine, Ser: 0.81 mg/dL (ref 0.61–1.24)
GFR calc Af Amer: >60 mL/min (ref >60)
GFR calc non Af Amer: >60 mL/min (ref >60)
Glucose, Bld: 113 mg/dL — ABNORMAL HIGH (ref 70–99)
Potassium: 4 mmol/L (ref 3.5–5.1)
Sodium: 127 mmol/L — ABNORMAL LOW (ref 135–145)
Total Bilirubin: 0.7 mg/dL (ref 0.3–1.2)
Total Protein: 6.3 g/dL — ABNORMAL LOW (ref 6.5–8.1)

## 2018-12-05 LAB — MAGNESIUM: Magnesium: 1.8 mg/dL (ref 1.7–2.4)

## 2018-12-05 NOTE — Progress Notes (Addendum)
HEMATOLOGY-ONCOLOGY PROGRESS NOTE  SUBJECTIVE: Back pain about the same. No function in his LE, unchanged. Continues on XRT. No new complaints.   PHYSICAL EXAMINATION:  Vitals:   12/05/18 0540 12/05/18 0926  BP: 126/83   Pulse: (!) 59 61  Resp: 18   Temp: 97.8 F (36.6 C)   SpO2: 98%    Filed Weights   12/03/18 1451  Weight: 184 lb 11.9 oz (83.8 kg)    Intake/Output from previous day: 09/16 0701 - 09/17 0700 In: 840 [P.O.:840] Out: 1450 [Urine:1450]  GENERAL:alert, no distress and comfortable LUNGS: clear to auscultation and percussion with normal breathing effort HEART: regular rate & rhythm and no murmurs and no lower extremity edema ABDOMEN:abdomen soft, non-tender and normal bowel sounds Musculoskeletal: cannot move lower extremities.   NEURO: alert & oriented x 3. No sensation from mid thigh down to feet.   LABORATORY DATA:  I have reviewed the data as listed CMP Latest Ref Rng & Units 12/05/2018 12/01/2018 11/30/2018  Glucose 70 - 99 mg/dL 113(H) 107(H) 106(H)  BUN 8 - 23 mg/dL 30(H) 27(H) 21  Creatinine 0.61 - 1.24 mg/dL 0.81 0.72 0.76  Sodium 135 - 145 mmol/L 127(L) 129(L) 128(L)  Potassium 3.5 - 5.1 mmol/L 4.0 4.3 4.2  Chloride 98 - 111 mmol/L 93(L) 96(L) 94(L)  CO2 22 - 32 mmol/L '27 25 25  '$ Calcium 8.9 - 10.3 mg/dL 8.7(L) 8.8(L) 8.6(L)  Total Protein 6.5 - 8.1 g/dL 6.3(L) - -  Total Bilirubin 0.3 - 1.2 mg/dL 0.7 - -  Alkaline Phos 38 - 126 U/L 90 - -  AST 15 - 41 U/L 14(L) - -  ALT 0 - 44 U/L 15 - -    Lab Results  Component Value Date   WBC 7.9 12/05/2018   HGB 13.9 12/05/2018   HCT 42.0 12/05/2018   MCV 88.2 12/05/2018   PLT 179 12/05/2018   NEUTROABS 5.1 12/01/2018    Ct Chest W Contrast  Result Date: 12/02/2018 CLINICAL DATA:  Restaging lung cancer. EXAM: CT CHEST, ABDOMEN, AND PELVIS WITH CONTRAST TECHNIQUE: Multidetector CT imaging of the chest, abdomen and pelvis was performed following the standard protocol during bolus administration of  intravenous contrast. CONTRAST:  19m OMNIPAQUE IOHEXOL 300 MG/ML  SOLN COMPARISON:  None. CT chest 07/10/2018 and CT AP from 05/24/2018. FINDINGS: CT CHEST FINDINGS Cardiovascular: Normal heart size. No pericardial effusion identified. Aortic atherosclerosis. Three vessel coronary artery atherosclerotic calcifications. Left chest wall pacer device is noted with lead in the right atrial appendage and coronary sinus. Mediastinum/Nodes: Normal appearance of the thyroid gland. The trachea appears patent and is midline. Normal appearance of the esophagus. No mediastinal or hilar adenopathy. No axillary or supraclavicular adenopathy. Lungs/Pleura: No pleural effusion identified. Status post left upper lobectomy. Masslike architectural distortion within the perihilar left upper lobe adjacent to the suture line with associated loculated pleural thickening and fluid is unchanged from the previous exam. Within the superior segment of the right lower lobe there is a part solid nodule measuring 2.9 cm, image 53/4. Previously 3.3 cm. Adjacent architectural distortion and pleural thickening is similar. Postsurgical changes within the medial aspect of the right upper lung with suture line is unchanged. Purely ground-glass attenuating nodule within the anterior right middle lobe measures 1.0 cm, image 74/4. Unchanged. Part solid nodule within the anterolateral right lower lobe measures 5 cm, image 91/4. Stable from previous study. Calcified granuloma identified in the subpleural, posterior right lower lobe. No new pulmonary nodule or mass identified bilaterally. Musculoskeletal:  Thoracic spondylosis. Unchanged displaced right lateral fifth rib deformity, image 33/3. No acute or suspicious osseous abnormality. CT ABDOMEN PELVIS FINDINGS Hepatobiliary: No focal liver abnormality is seen. No gallstones, gallbladder wall thickening, or biliary dilatation. Pancreas: Unremarkable. No pancreatic ductal dilatation or surrounding  inflammatory changes. Spleen: Normal in size without focal abnormality. Adrenals/Urinary Tract: Status post right adrenalectomy. Normal left adrenal gland. The kidneys are unremarkable. Thick walled urinary bladder is collapsed around a Foley catheter. Stomach/Bowel: Stomach is within normal limits. Appendix appears normal. No evidence of bowel wall thickening, distention, or inflammatory changes. Vascular/Lymphatic: Aortic atherosclerosis. No aneurysm identified. No pelvic or inguinal adenopathy. Reproductive: Prostate gland enlargement. Other: No free fluid or fluid collections. Musculoskeletal: No acute or suspicious osseous abnormality. Lumbar degenerative disc disease. There are dysplastic changes involving the right femoral head with marked secondary degenerative changes. Moderate degenerative changes involve the left hip. IMPRESSION: 1. Stable exam. Similar appearance of post treatment changes within bilateral lungs without evidence for disease progression/recurrence. 2. Stable appearance of part solid nodules within the right lung. 3. Pure ground-glass attenuating nodule within the anterior right upper lobe is also unchanged. 4. Aortic Atherosclerosis (ICD10-I70.0) and Emphysema (ICD10-J43.9). 5. Coronary artery atherosclerotic calcifications. Electronically Signed   By: Kerby Moors M.D.   On: 12/02/2018 14:41   Ct Thoracic Spine W Contrast  Result Date: 11/26/2018 CLINICAL DATA:  73 year old male with grade 2 lower lumbar spondylolisthesis status post posterior decompression of the lumbar spine in May this year. Evidence of solid ankylosis at the L4-L5 spondylolisthesis level, however worsening weakness and neurologic symptoms since the time of surgery. History of lung cancer with multiple recurrences. FLUOROSCOPY TIME:  1 minutes 12 seconds PROCEDURE: LUMBAR PUNCTURE FOR THORACIC AND LUMBAR MYELOGRAM Lumbar puncture and intrathecal contrast administration were performed by Dr. Kristeen Miss who will  separately report for the portion of the procedure. I personally supervised acquisition of the myelogram images. COMPARISON:  Postoperative lumbar spine CT 09/26/2018. Preoperative CT 07/10/2018. chest CT 07/10/2018. TECHNIQUE: Contiguous axial images were obtained through the Thoracic and Lumbar spine after the intrathecal infusion of infusion. Coronal and sagittal reconstructions were obtained of the axial image sets. FINDINGS: MYELOGRAM FINDINGS: Lumbar puncture was performed at the upper lumbar level. On the initial postcontrast images there is 5-6 millimeter round filling defect occupying the left half of the thecal sac at the L3 vertebral body level. This became more difficult to see over the course of the myelographic images and will be reported in the lumbar CT below. Stable spondylolisthesis of L4 on L5. Stable vertebral height and alignment elsewhere including mild retrolisthesis of L1 on L2. Good lumbar intrathecal sac opacification, with no lumbar spinal stenosis from L2 to the sacrum. There is a defect on the ventral thecal sac at L1-L2 related to chronic disc osteophyte complex, appearing similar to that expected based on the July CT. A 2nd lateral view in mild Trendelenburg demonstrates contrast flow beyond this level, and normal appearing thecal sac patency at T12-L1. See additional post myelogram CT findings of the thoracic spine below. CT THORACIC MYELOGRAM FINDINGS: Decision was made following the acquisition of plain lumbar myelogram images to obtain CT post myelogram images also of the thoracic spine. Thoracic spine images were then obtained in a delayed fashion at 1241 hours (versus CT acquisition at 1133 hours of the lumbar spine. Only trace myelographic contrast is present in the thoracic spine, and seems to be mostly subdural space contrast. However, on axial series 7 images 68 through 110 there does  appear to be a small amount of intrathecal thoracic contrast delineating a nonenlarged  thoracic spinal cord from the T7 through the T10 level. Additionally the conus from T11-T12 continues to have an unusual post myelographic appearance which seems unlikely just to the mixed contrast injection (over an hour delayed from the lumbar images). And furthermore there remains a nodular filling defect in the right thecal sac at T12 (in addition to the multifocal cauda equina nodularity described below). There is no evidence of degenerative thoracic spinal stenosis. No acute or suspicious osseous lesion is identified in the thoracic spine. Persistent masslike opacity at the left hilum and in the visible posterior left upper lung pleural space. Partially visible persistent right upper lobe curvilinear confluent opacity on series 6, image 40. No new or increased mediastinal lymph nodes are identified. CT LUMBAR MYELOGRAM FINDINGS: Stable visible abdominal viscera including Calcified aortic atherosclerosis. And right adrenal region surgical clips. Stable posterior paraspinal soft tissues. No acute osseous abnormality identified. Stable posterior postoperative changes at L3-L4 and L4-L5. Solid interbody ankylosis redemonstrated at L4-L5. Intact visible sacrum and SI joints. T12-L1: The tip of the conus is at this level, above which there is only trace subdural intraspinal contrast. A filling defect in the right thecal sac on series 5, image 41 was initially felt probably related to the mixed injection surrounding the exiting right T12 nerve - although that seems less likely given the persistence on the delayed thoracic CT described above. No spinal stenosis at this level, minor disc bulging and endplate spurring appear stable since July. L1-L2: Chronic mild retrolisthesis with disc space loss and circumferential disc osteophyte complex. Mild spinal stenosis and mild to moderate left lateral recess stenosis (series 5, image 57). Moderate bilateral L1 foraminal stenosis. This level appears stable since July. L2-L3:  Circumferential disc bulge and mild to moderate posterior element hypertrophy. No spinal stenosis. Borderline to mild left lateral recess stenosis. Mild to moderate left and borderline to mild right L2 foraminal stenosis. L3: Posterior to the L3 vertebral body to the left of midline there is a small 4-5 millimeter low-density filling defect associated with the cauda equina (series 5, image 80 and sagittal series 8, image 35), around which there is mildly hyperdense contrast, which appears similar to the epidural/subdural contrast also in this region (see bilateral exiting L3 nerve roots). Just cephalad of this lesion and near the midline there is a subtle 2 millimeter area of thickening of the cauda equina (series 5, image 76). L3-L4: Posterior decompression of this level. Circumferential disc osteophyte complex and residual posterior element hypertrophy with widely patent thecal sac. Borderline to mild bilateral L3 foraminal stenosis. L4-L5: Chronic grade 2 spondylolisthesis with interbody ankylosis and posterior decompression. There is mildly irregular thickening of multiple cauda equina nerve roots also at this level (series 5, image 95) most of which are not displaced outwardly along the walls of the thecal sac. No spinal stenosis. No lateral recess stenosis. Moderate to severe bilateral L4 foraminal stenosis which is probably greater on the right. L5-S1:  Mild disc bulge and endplate spurring without stenosis. IMPRESSION: 1. 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 Mild chronic appearing lumbar spinal stenosis at L1-L2 is likely stable since that time, with decompressed L3-L4 and L4-L5 levels and no other significant lumbar spinal stenosis. 2. Non-specific thickening and nodularity of the cauda equina at the T12, L3, and L4 levels, with a superimposed unusual and bulbous appearance of the  conus medullaris, which though initially  felt related to a mixed contrast injection at the time of lumbar CT images (1133 hours) remained unchanged on delayed thoracic spine images at 1241 hours. And with no interval cephalad migration of intrathecal contrast, despite absent CT evidence of any thoracic spinal stenosis. 3. This constellation of clinical and imaging findings therefore raises the possibility of intrathecal metastatic disease with involvement of the conus and cauda equina. These studies were discussed by telephone with Dr. Kristeen Miss on 11/26/2018 at 13:20. Electronically Signed   By: Genevie Ann M.D.   On: 11/26/2018 13:25   Ct Lumbar Spine W Contrast  Result Date: 11/26/2018 CLINICAL DATA:  73 year old male with grade 2 lower lumbar spondylolisthesis status post posterior decompression of the lumbar spine in May this year. Evidence of solid ankylosis at the L4-L5 spondylolisthesis level, however worsening weakness and neurologic symptoms since the time of surgery. History of lung cancer with multiple recurrences. FLUOROSCOPY TIME:  1 minutes 12 seconds PROCEDURE: LUMBAR PUNCTURE FOR THORACIC AND LUMBAR MYELOGRAM Lumbar puncture and intrathecal contrast administration were performed by Dr. Kristeen Miss who will separately report for the portion of the procedure. I personally supervised acquisition of the myelogram images. COMPARISON:  Postoperative lumbar spine CT 09/26/2018. Preoperative CT 07/10/2018. chest CT 07/10/2018. TECHNIQUE: Contiguous axial images were obtained through the Thoracic and Lumbar spine after the intrathecal infusion of infusion. Coronal and sagittal reconstructions were obtained of the axial image sets. FINDINGS: MYELOGRAM FINDINGS: Lumbar puncture was performed at the upper lumbar level. On the initial postcontrast images there is 5-6 millimeter round filling defect occupying the left half of the thecal sac at the L3 vertebral body level. This became more difficult to see over the course of the myelographic images and  will be reported in the lumbar CT below. Stable spondylolisthesis of L4 on L5. Stable vertebral height and alignment elsewhere including mild retrolisthesis of L1 on L2. Good lumbar intrathecal sac opacification, with no lumbar spinal stenosis from L2 to the sacrum. There is a defect on the ventral thecal sac at L1-L2 related to chronic disc osteophyte complex, appearing similar to that expected based on the July CT. A 2nd lateral view in mild Trendelenburg demonstrates contrast flow beyond this level, and normal appearing thecal sac patency at T12-L1. See additional post myelogram CT findings of the thoracic spine below. CT THORACIC MYELOGRAM FINDINGS: Decision was made following the acquisition of plain lumbar myelogram images to obtain CT post myelogram images also of the thoracic spine. Thoracic spine images were then obtained in a delayed fashion at 1241 hours (versus CT acquisition at 1133 hours of the lumbar spine. Only trace myelographic contrast is present in the thoracic spine, and seems to be mostly subdural space contrast. However, on axial series 7 images 68 through 110 there does appear to be a small amount of intrathecal thoracic contrast delineating a nonenlarged thoracic spinal cord from the T7 through the T10 level. Additionally the conus from T11-T12 continues to have an unusual post myelographic appearance which seems unlikely just to the mixed contrast injection (over an hour delayed from the lumbar images). And furthermore there remains a nodular filling defect in the right thecal sac at T12 (in addition to the multifocal cauda equina nodularity described below). There is no evidence of degenerative thoracic spinal stenosis. No acute or suspicious osseous lesion is identified in the thoracic spine. Persistent masslike opacity at the left hilum and in the visible posterior left upper lung pleural space. Partially visible  persistent right upper lobe curvilinear confluent opacity on series 6,  image 40. No new or increased mediastinal lymph nodes are identified. CT LUMBAR MYELOGRAM FINDINGS: Stable visible abdominal viscera including Calcified aortic atherosclerosis. And right adrenal region surgical clips. Stable posterior paraspinal soft tissues. No acute osseous abnormality identified. Stable posterior postoperative changes at L3-L4 and L4-L5. Solid interbody ankylosis redemonstrated at L4-L5. Intact visible sacrum and SI joints. T12-L1: The tip of the conus is at this level, above which there is only trace subdural intraspinal contrast. A filling defect in the right thecal sac on series 5, image 41 was initially felt probably related to the mixed injection surrounding the exiting right T12 nerve - although that seems less likely given the persistence on the delayed thoracic CT described above. No spinal stenosis at this level, minor disc bulging and endplate spurring appear stable since July. L1-L2: Chronic mild retrolisthesis with disc space loss and circumferential disc osteophyte complex. Mild spinal stenosis and mild to moderate left lateral recess stenosis (series 5, image 57). Moderate bilateral L1 foraminal stenosis. This level appears stable since July. L2-L3: Circumferential disc bulge and mild to moderate posterior element hypertrophy. No spinal stenosis. Borderline to mild left lateral recess stenosis. Mild to moderate left and borderline to mild right L2 foraminal stenosis. L3: Posterior to the L3 vertebral body to the left of midline there is a small 4-5 millimeter low-density filling defect associated with the cauda equina (series 5, image 80 and sagittal series 8, image 35), around which there is mildly hyperdense contrast, which appears similar to the epidural/subdural contrast also in this region (see bilateral exiting L3 nerve roots). Just cephalad of this lesion and near the midline there is a subtle 2 millimeter area of thickening of the cauda equina (series 5, image 76). L3-L4:  Posterior decompression of this level. Circumferential disc osteophyte complex and residual posterior element hypertrophy with widely patent thecal sac. Borderline to mild bilateral L3 foraminal stenosis. L4-L5: Chronic grade 2 spondylolisthesis with interbody ankylosis and posterior decompression. There is mildly irregular thickening of multiple cauda equina nerve roots also at this level (series 5, image 95) most of which are not displaced outwardly along the walls of the thecal sac. No spinal stenosis. No lateral recess stenosis. Moderate to severe bilateral L4 foraminal stenosis which is probably greater on the right. L5-S1:  Mild disc bulge and endplate spurring without stenosis. IMPRESSION: 1. 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 Mild chronic appearing lumbar spinal stenosis at L1-L2 is likely stable since that time, with decompressed L3-L4 and L4-L5 levels and no other significant lumbar spinal stenosis. 2. Non-specific thickening and nodularity of the cauda equina at the T12, L3, and L4 levels, with a superimposed unusual and bulbous appearance of the conus medullaris, which though initially felt related to a mixed contrast injection at the time of lumbar CT images (1133 hours) remained unchanged on delayed thoracic spine images at 1241 hours. And with no interval cephalad migration of intrathecal contrast, despite absent CT evidence of any thoracic spinal stenosis. 3. This constellation of clinical and imaging findings therefore raises the possibility of intrathecal metastatic disease with involvement of the conus and cauda equina. These studies were discussed by telephone with Dr. Kristeen Miss on 11/26/2018 at 13:20. Electronically Signed   By: Genevie Ann M.D.   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 Brain staging. The patient has an MRI compatible cardiac pacemaker. EXAM: MRI HEAD WITHOUT AND WITH CONTRAST TECHNIQUE: Multiplanar, multiecho pulse sequences of the brain and surrounding structures were obtained without and with intravenous contrast. CONTRAST:  20m GADAVIST GADOBUTROL 1 MMOL/ML IV SOLN COMPARISON:  Thoracic and lumbar MRI yesterday. FINDINGS: Brain: No restricted diffusion to suggest acute infarction. No midline shift, mass effect, ventriculomegaly, extra-axial collection or acute intracranial hemorrhage. Cervicomedullary junction and pituitary are within normal limits. Prior to contrast gray and white matter signal is largely normal for age throughout the brain. 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 But otherwise only minimal nonspecific white matter T2 and FLAIR hyperintensity. Following contrast there is no abnormal intracranial enhancement identified. No leptomeningeal or dural thickening identified inside the skull. Also the visible cervical spinal cord to C3-C4 seems to remain normal. Vascular: The distal right vertebral artery flow void is lost on series 10, image 3. The left vertebral appears mildly dominant. The other Major intracranial vascular flow voids are preserved. The major dural venous sinuses are enhancing and appear to be patent. Skull and upper cervical spine: Visualized bone marrow signal is within normal limits. C3-C4 disc and endplate degeneration. Sinuses/Orbits: Negative orbits. Trace paranasal sinus mucosal thickening. Other: Mastoids are clear. Visible internal auditory structures appear normal. Right lateral convexity scalp lipoma incidentally noted on series 17, image 14. However, there is a nonspecific 12 millimeter heterogeneously enhancing scalp lesion located near the right temple on series 19, image 18 (note abnormal diffusion on series 7,  image 56). IMPRESSION: 1. No metastatic disease to the brain or cerebral meninges is identified. 2. Possible 12 mm scalp soft tissue metastasis near the right temple (series 19, image 18). Alternatively this might be an inflamed proteinaceous dermal lesion. 3. No metastatic disease identified in the visible upper cervical spine. Electronically Signed   By: HGenevie AnnM.D.   On: 11/29/2018 16:05   Mr Thoracic Spine W Wo Contrast  Result Date: 11/28/2018 CLINICAL DATA:  Bilateral leg weakness. Abnormal CT myelogram 11/26/2018. History of lung cancer. The patient has a pacemaker which is MRI compatible. EXAM: MRI THORACIC WITHOUT AND WITH CONTRAST TECHNIQUE: Multiplanar and multiecho pulse sequences of the thoracic spine were obtained without and with intravenous contrast. CONTRAST:  963mGADAVIST GADOBUTROL 1 MMOL/ML IV SOLN COMPARISON:  CT myelogram 11/26/2018 FINDINGS: MRI THORACIC SPINE FINDINGS Alignment:  Normal Vertebrae: Fatty bone marrow changes are present T3 through T7 compatible prior chest radiation for left-sided lung cancer. No thoracic fracture or metastatic disease to the spine. Cord: Expansile mass lesion in the conus medullaris at the T11 and T12 level. The mass measures 14 x 19 x 37 mm and appears intramedullary. There is significant cord edema above the mass. Cord edema continues into the mid and upper thoracic cord. Postcontrast images reveal multiple enhancing masses along the surface of the cord at T1, T2, T3, T5, and T6. Likely several other smaller lesions are also present. Enhancement extends into the left T3 neural foramina. Paraspinal and other soft tissues: Left upper lobe mass. No significant pleural effusion. Right upper lobe abnormality possibly metastatic disease. Disc levels: 2 mm anterolisthesis C7-T1 due to facet degeneration. Mild thoracic disc degeneration. Negative for disc protrusion. IMPRESSION: Enhancing mass lesion in the conus medullaris. Multiple small enhancing masses  along the surface of the cord in the upper and midthoracic spine.  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 Recommend MRI of the brain without with contrast evaluate for metastatic disease. No evidence of bony metastatic disease These results were called by telephone at the time of interpretation on 11/28/2018 at 2:35 pm to provider [facility] , who verbally acknowledged these results. Electronically Signed   By: Franchot Gallo M.D.   On: 11/28/2018 14:36   Mr Lumbar Spine W Wo Contrast  Result Date: 11/28/2018 CLINICAL DATA:  Back pain. 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 History of lung cancer. Lumbar laminectomy 07/25/2018 L4-5 level. Abnormal myelogram 11/26/2018 EXAM: MRI LUMBAR SPINE WITHOUT AND WITH CONTRAST TECHNIQUE: Multiplanar and multiecho pulse sequences of the lumbar spine were obtained without and with intravenous contrast. CONTRAST:  32m GADAVIST GADOBUTROL 1 MMOL/ML IV SOLN COMPARISON:  Thoracic and lumbar myelogram 11/26/2018 FINDINGS: Segmentation:  Normal Alignment:  Mild retrolisthesis L1-2, 4 mm. 16 mm anterolisthesis L4-5 with severe disc degeneration at this level and bilateral pars defects of L4 Vertebrae: Negative for vertebral fracture or mass lesion. Small Schmorl's node with surrounding bone marrow edema at L1-2. Conus medullaris and cauda equina: Conus medullaris appears to terminate at T12-L1. There is and expansile mass lesion in the distal spinal cord and conus medullaris which shows heterogeneous signal intensity and enhancement. The mass measures approximately 14 x 9 mm on transverse images and extends over a 37 mm length. Small areas of hemorrhage are present within the mass. This accounts for the intradural mass identified on myelogram. There is edema in the cord above the mass. Paraspinal and other soft tissues: Negative for retroperitoneal mass or adenopathy. Urinary bladder is distended with Foley  catheter. Disc levels: T12-L1: Mild facet degeneration.  Negative for stenosis L1-2: 4 mm retrolisthesis with disc and facet degeneration. Mild spinal stenosis and mild subarticular stenosis bilaterally L2-3: Diffuse bulging of the disc. Shallow foraminal disc protrusion bilaterally. Mild facet degeneration. Mild spinal stenosis and mild subarticular stenosis bilaterally L3-4: Diffuse disc bulging and mild facet degeneration. Negative for stenosis L4-5: 16 mm anterolisthesis with bilateral pars defects. Moderate to severe foraminal encroachment bilaterally with impingement of the L4 nerve root bilaterally. L5-S1: Small central disc protrusion. Mild flattening of the right S1 nerve root. IMPRESSION: Enhancing mass involving the distal spinal cord and conus medullaris. This caused myelographic near complete block 2 days ago. 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 Review of the thoracic MRI demonstrates multiple enhancing masses along the surface of the cord in the thoracic spine, therefore metastatic disease is felt to be most likely. Negative for bony metastatic disease lumbar spine Grade 2 slip L4-5 due to bilateral pars defects of L4 resulting and moderate to severe foraminal encroachment bilaterally. These results were called by telephone at the time of interpretation on 11/28/2018 at 2:26 pm to [facility], who verbally acknowledged these results. Electronically Signed   By: CFranchot GalloM.D.   On: 11/28/2018 14:28   Ct Abdomen Pelvis W Contrast  Result Date: 12/02/2018 CLINICAL DATA:  Restaging lung cancer. EXAM: CT CHEST, ABDOMEN, AND PELVIS WITH CONTRAST TECHNIQUE: Multidetector CT imaging of the chest, abdomen and pelvis was performed following the standard protocol during bolus administration of intravenous contrast. CONTRAST:  1064mOMNIPAQUE IOHEXOL 300 MG/ML  SOLN COMPARISON:  None. CT chest 07/10/2018 and CT AP from 05/24/2018. FINDINGS: CT CHEST FINDINGS  Cardiovascular: Normal heart size. No pericardial effusion identified. Aortic atherosclerosis. Three vessel coronary artery atherosclerotic calcifications. Left chest wall pacer device is noted  with lead in the right atrial appendage and coronary sinus. Mediastinum/Nodes: Normal appearance of the thyroid gland. The trachea appears patent and is midline. Normal appearance of the esophagus. No mediastinal or hilar adenopathy. No axillary or supraclavicular adenopathy. Lungs/Pleura: No pleural effusion identified. Status post left upper lobectomy. Masslike architectural distortion within the perihilar left upper lobe adjacent to the suture line with associated loculated pleural thickening and fluid is unchanged from the previous exam. Within the superior segment of the right lower lobe there is a part solid nodule measuring 2.9 cm, image 53/4. Previously 3.3 cm. Adjacent architectural distortion and pleural thickening is similar. Postsurgical changes within the medial aspect of the right upper lung with suture line is unchanged. Purely ground-glass attenuating nodule within the anterior right middle lobe measures 1.0 cm, image 74/4. Unchanged. Part solid nodule within the anterolateral right lower lobe measures 5 cm, image 91/4. Stable from previous study. Calcified granuloma identified in the subpleural, posterior right lower lobe. No new pulmonary nodule or mass identified bilaterally. Musculoskeletal: Thoracic spondylosis. Unchanged displaced right lateral fifth rib deformity, image 33/3. No acute or suspicious osseous abnormality. CT ABDOMEN PELVIS FINDINGS Hepatobiliary: No focal liver abnormality is seen. No gallstones, gallbladder wall thickening, or biliary dilatation. Pancreas: Unremarkable. No pancreatic ductal dilatation or surrounding inflammatory changes. Spleen: Normal in size without focal abnormality. Adrenals/Urinary Tract: Status post right adrenalectomy. Normal left adrenal gland. The kidneys are  unremarkable. Thick walled urinary bladder is collapsed around a Foley catheter. Stomach/Bowel: Stomach is within normal limits. Appendix appears normal. No evidence of bowel wall thickening, distention, or inflammatory changes. Vascular/Lymphatic: Aortic atherosclerosis. No aneurysm identified. No pelvic or inguinal adenopathy. Reproductive: Prostate gland enlargement. Other: No free fluid or fluid collections. Musculoskeletal: No acute or suspicious osseous abnormality. Lumbar degenerative disc disease. There are dysplastic changes involving the right femoral head with marked secondary degenerative changes. Moderate degenerative changes involve the left hip. IMPRESSION: 1. Stable exam. Similar appearance of post treatment changes within bilateral lungs without evidence for disease progression/recurrence. 2. Stable appearance of part solid nodules within the right lung. 3. Pure ground-glass attenuating nodule within the anterior right upper lobe is also unchanged. 4. Aortic Atherosclerosis (ICD10-I70.0) and Emphysema (ICD10-J43.9). 5. Coronary artery atherosclerotic calcifications. Electronically Signed   By: Kerby Moors M.D.   On: 12/02/2018 14:41   Dg Myelogram Lumbar  Result Date: 11/26/2018 CLINICAL DATA:  73 year old male with grade 2 lower lumbar spondylolisthesis status post posterior decompression of the lumbar spine in May this year. Evidence of solid ankylosis at the L4-L5 spondylolisthesis level, however worsening weakness and neurologic symptoms since the time of surgery. History of lung cancer with multiple recurrences. FLUOROSCOPY TIME:  1 minutes 12 seconds PROCEDURE: LUMBAR PUNCTURE FOR THORACIC AND LUMBAR MYELOGRAM Lumbar puncture and intrathecal contrast administration were performed by Dr. Kristeen Miss who will separately report for the portion of the procedure. I personally supervised acquisition of the myelogram images. COMPARISON:  Postoperative lumbar spine CT 09/26/2018. Preoperative  CT 07/10/2018. chest CT 07/10/2018. TECHNIQUE: Contiguous axial images were obtained through the Thoracic and Lumbar spine after the intrathecal infusion of infusion. Coronal and sagittal reconstructions were obtained of the axial image sets. FINDINGS: MYELOGRAM FINDINGS: Lumbar puncture was performed at the upper lumbar level. On the initial postcontrast images there is 5-6 millimeter round filling defect occupying the left half of the thecal sac at the L3 vertebral body level. This became more difficult to see over the course of the myelographic images and will be reported in the  lumbar CT below. Stable spondylolisthesis of L4 on L5. Stable vertebral height and alignment elsewhere including mild retrolisthesis of L1 on L2. Good lumbar intrathecal sac opacification, with no lumbar spinal stenosis from L2 to the sacrum. There is a defect on the ventral thecal sac at L1-L2 related to chronic disc osteophyte complex, appearing similar to that expected based on the July CT. A 2nd lateral view in mild Trendelenburg demonstrates contrast flow beyond this level, and normal appearing thecal sac patency at T12-L1. See additional post myelogram CT findings of the thoracic spine below. CT THORACIC MYELOGRAM FINDINGS: Decision was made following the acquisition of plain lumbar myelogram images to obtain CT post myelogram images also of the thoracic spine. Thoracic spine images were then obtained in a delayed fashion at 1241 hours (versus CT acquisition at 1133 hours of the lumbar spine. Only trace myelographic contrast is present in the thoracic spine, and seems to be mostly subdural space contrast. However, on axial series 7 images 68 through 110 there does appear to be a small amount of intrathecal thoracic contrast delineating a nonenlarged thoracic spinal cord from the T7 through the T10 level. Additionally the conus from T11-T12 continues to have an unusual post myelographic appearance which seems unlikely just to the  mixed contrast injection (over an hour delayed from the lumbar images). And furthermore there remains a nodular filling defect in the right thecal sac at T12 (in addition to the multifocal cauda equina nodularity described below). There is no evidence of degenerative thoracic spinal stenosis. No acute or suspicious osseous lesion is identified in the thoracic spine. Persistent masslike opacity at the left hilum and in the visible posterior left upper lung pleural space. Partially visible persistent right upper lobe curvilinear confluent opacity on series 6, image 40. No new or increased mediastinal lymph nodes are identified. CT LUMBAR MYELOGRAM FINDINGS: Stable visible abdominal viscera including Calcified aortic atherosclerosis. And right adrenal region surgical clips. Stable posterior paraspinal soft tissues. No acute osseous abnormality identified. Stable posterior postoperative changes at L3-L4 and L4-L5. Solid interbody ankylosis redemonstrated at L4-L5. Intact visible sacrum and SI joints. T12-L1: The tip of the conus is at this level, above which there is only trace subdural intraspinal contrast. A filling defect in the right thecal sac on series 5, image 41 was initially felt probably related to the mixed injection surrounding the exiting right T12 nerve - although that seems less likely given the persistence on the delayed thoracic CT described above. No spinal stenosis at this level, minor disc bulging and endplate spurring appear stable since July. L1-L2: Chronic mild retrolisthesis with disc space loss and circumferential disc osteophyte complex. Mild spinal stenosis and mild to moderate left lateral recess stenosis (series 5, image 57). Moderate bilateral L1 foraminal stenosis. This level appears stable since July. L2-L3: Circumferential disc bulge and mild to moderate posterior element hypertrophy. No spinal stenosis. Borderline to mild left lateral recess stenosis. Mild to moderate left and  borderline to mild right L2 foraminal stenosis. L3: Posterior to the L3 vertebral body to the left of midline there is a small 4-5 millimeter low-density filling defect associated with the cauda equina (series 5, image 80 and sagittal series 8, image 35), around which there is mildly hyperdense contrast, which appears similar to the epidural/subdural contrast also in this region (see bilateral exiting L3 nerve roots). Just cephalad of this lesion and near the midline there is a subtle 2 millimeter area of thickening of the cauda equina (series 5, image 76).  L3-L4: Posterior decompression of this level. Circumferential disc osteophyte complex and residual posterior element hypertrophy with widely patent thecal sac. Borderline to mild bilateral L3 foraminal stenosis. L4-L5: Chronic grade 2 spondylolisthesis with interbody ankylosis and posterior decompression. There is mildly irregular thickening of multiple cauda equina nerve roots also at this level (series 5, image 95) most of which are not displaced outwardly along the walls of the thecal sac. No spinal stenosis. No lateral recess stenosis. Moderate to severe bilateral L4 foraminal stenosis which is probably greater on the right. L5-S1:  Mild disc bulge and endplate spurring without stenosis. IMPRESSION: 1. 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 Mild chronic appearing lumbar spinal stenosis at L1-L2 is likely stable since that time, with decompressed L3-L4 and L4-L5 levels and no other significant lumbar spinal stenosis. 2. Non-specific thickening and nodularity of the cauda equina at the T12, L3, and L4 levels, with a superimposed unusual and bulbous appearance of the conus medullaris, which though initially felt related to a mixed contrast injection at the time of lumbar CT images (1133 hours) remained unchanged on delayed thoracic spine images at 1241 hours. And with no interval  cephalad migration of intrathecal contrast, despite absent CT evidence of any thoracic spinal stenosis. 3. This constellation of clinical and imaging findings therefore raises the possibility of intrathecal metastatic disease with involvement of the conus and cauda equina. These studies were discussed by telephone with Dr. Kristeen Miss on 11/26/2018 at 13:20. Electronically Signed   By: Genevie Ann M.D.   On: 11/26/2018 13:25   Vas [place] Lower Extremity Venous (dvt)  Result Date: 11/28/2018  Lower Venous Study Indications: Edema.  Risk Factors: Cancer metastatic. Comparison Study: 05/17/24 bilateral lev negative Performing Technologist: Toma Copier RVS  Examination Guidelines: A complete evaluation includes B-mode imaging, spectral Doppler, color Doppler, and power Doppler as needed of all accessible portions of each vessel. Bilateral testing is considered an integral part of a complete examination. Limited examinations for reoccurring indications may be performed as noted.  +---------+---------------+---------+-----------+----------+-------------------+ RIGHT    CompressibilityPhasicitySpontaneityPropertiesThrombus Aging      +---------+---------------+---------+-----------+----------+-------------------+ CFV      Full           Yes      Yes                                      +---------+---------------+---------+-----------+----------+-------------------+ SFJ      Full                                                             +---------+---------------+---------+-----------+----------+-------------------+ FV Prox  Full           Yes      Yes                                      +---------+---------------+---------+-----------+----------+-------------------+ FV Mid   Full                                                             +---------+---------------+---------+-----------+----------+-------------------+  FV DistalFull           Yes      Yes                                       +---------+---------------+---------+-----------+----------+-------------------+ PFV      Full           Yes      Yes                                      +---------+---------------+---------+-----------+----------+-------------------+ POP                     Yes      Yes                  Patient unable to                                                         bend the knee for                                                         compression         +---------+---------------+---------+-----------+----------+-------------------+ PTV      Full                                                             +---------+---------------+---------+-----------+----------+-------------------+ PERO     Full                                                             +---------+---------------+---------+-----------+----------+-------------------+   +---------+---------------+---------+-----------+----------+--------------+ LEFT     CompressibilityPhasicitySpontaneityPropertiesThrombus Aging +---------+---------------+---------+-----------+----------+--------------+ CFV      Full           Yes      Yes                                 +---------+---------------+---------+-----------+----------+--------------+ SFJ      Full                                                        +---------+---------------+---------+-----------+----------+--------------+ FV Prox  Full           Yes      Yes                                 +---------+---------------+---------+-----------+----------+--------------+  FV Mid   Full                                                        +---------+---------------+---------+-----------+----------+--------------+ FV DistalFull           Yes      Yes                                 +---------+---------------+---------+-----------+----------+--------------+ PFV      Full           Yes      Yes                                  +---------+---------------+---------+-----------+----------+--------------+ POP      Full           Yes      Yes                                 +---------+---------------+---------+-----------+----------+--------------+ PTV      Full                                                        +---------+---------------+---------+-----------+----------+--------------+ PERO     Full                                                        +---------+---------------+---------+-----------+----------+--------------+     Summary: Right: There is no evidence of deep vein thrombosis in the lower extremity. No cystic structure found in the popliteal fossa. Left: There is no evidence of deep vein thrombosis in the lower extremity. No cystic structure found in the popliteal fossa.  *See table(s) above for measurements and observations. Electronically signed by Monica Martinez MD on 11/28/2018 at 3:19:13 PM.    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  2. 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  3. 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  4. 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 A re-staging PET scan June 28, 2009, showed no evidence for disease progression. CT of the chest 07/15/2010 showed no evidence of disease progression. CT chest 07/07/2014 revealed no evidence of recurrent lung cancer             -Prior radiation summary               - 06/27/2017-04/19/2019SBRT Treatment: The staple line lesion in the left lungwas              treated to 50Gy in 88factions of 10Gy. The RLL lesion was treated to 54 Gy in 3 fractions  of 18 Gy.             - 09/20/15-7/10/17SBRT Treatment:  Right lower lobe was treated to 54 Gy in 3 fractions of      18 Gy.              -06/03/07- 07/05/07: 62.5 Gy in 25 fractions of 2.5 Gy to an inferior left upper lobe target             -Foundation 1 testing (06/13/2017)             -No reportable alterations; UVO5D6 U440, NF1 splice site [id number], POLE [id number],           RAD21 amplification, KRAS G12C, MSS, tumor mutation burden 9 5. History of questionable small bilateral axillary lymph nodes.  6. History of bradycardia - followed by Dr Acie Fredrickson.  7. Chronic exertional dyspnea following lung surgery.  8. Chronic right "hip" discomfort.  9. history of mild thrombocytopenia-followed by Dr. Felipa Eth  10. Coronary artery disease, status post a myocardial infarction in August of 2014 11.  Hospital admission 11/26/2018 cauda equina syndrome  Mr. Medford appears stable.    He continues to receive radiation to his spine.  Pain seems to be overall well controlled.  Recommendations: 1.  Continue radiation.  He will need to remain inpatient for the duration of his radiation treatments due to difficulty with transportation as well as the distance that it would take for him to transport [place] as he lives in [place]. 2.    Discussed with patient option of comfort care/hospice versus receding with LP or biopsy of spinal mass to confirm diagnosis.  Suspect spinal mass is related to prior lung cancer but could be related to his prior history of melanoma versus another malignancy.  He is unsure how he wishes to proceed.  Brother is coming today for goals of care meeting with palliative care.    LOS: 9 days   Mikey Bussing, DNP, AGPCNP-BC, AOCNP 12/05/18 I discussed the current diagnosis and treatment options with Mr. Orvan Seen.  He understands no therapy will improve the bladder dysfunction or lower extremity weakness.  We discussed hospice care versus proceeding with a diagnostic biopsy (likely a lumbar puncture for cytology) to confirm the diagnosis and consider systemic treatment options.  It is unlikely a systemic therapy would  improve his quality of life.  I discussed the case with his brother by telephone.  Mr. Orvan Seen and his brother will discuss things over the next few days.  I will see him again on 12/09/2018.  Please call oncology as needed over the weekend.

## 2018-12-05 NOTE — TOC Progression Note (Signed)
Transition of Care [facility]) - Progression Note    Patient Details  Name: Benjamin [name] MRN: [id number] Date of Birth: 07-29-1945  Transition [facility] CM/SW Contact  Melik Blancett, Juliann Pulse, RN Phone Number: 12/05/2018, 9:20 AM  Clinical Narrative:  FL@ faxed to [facility] confirmed patient a LTC resident spoke to [place]. Await d/c to return back by PTAR, will need updated covid.     Expected Discharge Plan: [place] Barriers to Discharge: Continued Medical Work up  Expected Discharge Plan and Services Expected Discharge Plan: [place] Choice: [place] arrangements for the past 2 months: [place]                                       Social Determinants of Health (SDOH) Interventions    Readmission Risk Interventions No flowsheet data found.

## 2018-12-05 NOTE — Progress Notes (Signed)
PROGRESS NOTE    Benjamin [name]  QIH:[id number] DOB: 26-Sep-1945 DOA: 11/26/2018 PCP: Hennie Duos, MD     Brief Narrative:  73 y.o.malewith PMH of non-small cell lung cancer and paroxysmal atrial fibrillation on Eliquis at home presented to the ED on9/8/2020for further evaluation of bilateral lower extremity thoracic paralysis with sensory loss since last May. He started having some symptoms since April, rapidly progressive bilateral lower extremity weakness and sensory changes as well as bowel and bladder incontinence. Patient underwent lumbar decompression surgery. He did not have improvement of symptoms. Patient also has history of lung cancer, he had radiation therapies and treatment at least 6 times for the cancer since 2003 as per patient. Due to persistent symptoms and need for further management, he was brought to the hospital by neurosurgery, Dr. Ellene Route. MRI thoracic and lumbar spine showed enhancing mass lesion in the conus medullaris. Multiple small enhancing masses along the surface of the cord in the upper and midthoracic spine. 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 MRI brain showed no metastases to the brain or cerebral meninges. Admitted to medical service with neurology, neurosurgery and radiation oncology consultation. Patient is planned for 10 days of palliative radiation treatment at [facility].   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 MRI demonstrating multiple mass lesions in thoracic and lumbar spine, likely metastatic lung cancer.  Currently getting palliative radiation.  Continue pain management.  Oncology  on board.  Palliative care on board for discussion about goals of care and possible hospice.  neurogenic bladder -Continue indwelling Foley catheter.  Will exchange Foley catheter.  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  Fluid restriction.  chronic diastolic heart failure Compensated at this time.  Continue Lasix and Aldactone.  history of complete heart block status post pacemaker -Stable.  History of  paroxysmal atrial fibrillation -Continue metoprolol and Eliquis.  Currently in normal sinus rhythm.   Benign prostatic hypertrophy.  Continue Flomax.   Sacral and buttocks stage II pressure ulcer present on admission. Continue wound care and prevention protocol.    DVT prophylaxis: Eliquis  Code Status: Full  Family Communication: None  Disposition Plan: Currently getting palliative radiation.  Exchange Foley catheter.  Follow palliative care and oncology service recommendations.  Consultants:   -Oncology  -Neurosurgery  -Radiation oncology  -Palliative care.  Procedures:   Palliative radiation  Antimicrobials:  Anti-infectives (From admission, onward)   None     Subjective: Patient complains of back pain.  No fever, chills, shortness of breath, chest pain.  States that he wishes to change his Foley catheter and is due for that.  Objective: Vitals:   12/04/18 1458 12/04/18 1955 12/04/18 2100 12/05/18 0540  BP: 110/76  117/82 126/83  Pulse: (!) 59  60 (!) 59  Resp: 19  16 18   Temp: 97.8 F (36.6 C)  98.2 F (36.8 C) 97.8 F (36.6 C)  TempSrc: Oral  Oral Oral  SpO2: 97% 97% 95% 98%  Weight:      Height:        Intake/Output Summary (Last 24 hours) at 12/05/2018 0706 Last data filed at 12/05/2018 0600 Gross per 24 hour  Intake 840 ml  Output 1450 ml  Net -610 ml   Filed Weights   12/03/18 1451  Weight: 83.8 kg    Examination: General:  Average built, not in obvious distress, paraplegic. HENT: Normocephalic, pupils  equally reacting to light and accommodation.  No scleral pallor or icterus noted. Oral mucosa is moist.  Chest:  Clear breath sounds.  Diminished breath sounds bilaterally. No crackles or wheezes.  CVS: S1 &S2 heard. No murmur.  Regular rate and rhythm. Abdomen: Soft, nontender, nondistended.  Bowel sounds are heard.  Liver is not palpable, no abdominal mass palpated Extremities: No cyanosis, clubbing or edema.  Peripheral pulses are palpable. Psych: Alert, awake and oriented, normal mood CNS:  No cranial nerve deficits.  Bilateral lower extremity diminished sensation below the knee.  Paraplegia noted bilaterally.    Skin: Stage II sacral ulceration.   Data Reviewed: I have personally reviewed following labs and imaging studies  CBC: Recent Labs  Lab 12/01/18 0324  WBC 7.4  NEUTROABS 5.1  HGB 14.0  HCT 40.5  MCV 85.8  PLT 433   Basic Metabolic Panel: Recent Labs  Lab 11/29/18 0314 11/30/18 0320 12/01/18 0324  NA 128* 128* 129*  K 4.1 4.2 4.3  CL 94* 94* 96*  CO2 24 25 25   GLUCOSE 106* 106* 107*  BUN 20 21 27*  CREATININE 0.73 0.76 0.72  CALCIUM 8.6* 8.6* 8.8*   GFR: Estimated Creatinine Clearance: 85 mL/min (by C-G formula based on SCr of 0.72 mg/dL).  Urine analysis:    Component Value Date/Time   COLORURINE YELLOW 08/04/2018 [place] 08/04/2018 1338   LABSPEC 1.017 08/04/2018 1338   PHURINE 6.0 08/04/2018 1338   GLUCOSEU NEGATIVE 08/04/2018 1338   HGBUR SMALL (A) 08/04/2018 1338   [place] 08/04/2018 [place] 08/04/2018 [place] 08/04/2018 1338   NITRITE NEGATIVE 08/04/2018 [place] 08/04/2018 1338    Recent Results (from the past 240 hour(s))  SARS CORONAVIRUS 2 (TAT 6-24 HRS) Nasopharyngeal Nasopharyngeal Swab     Status: None   Collection Time: 11/27/18  8:51 AM   Specimen: Nasopharyngeal Swab  Result Value Ref Range Status   SARS Coronavirus 2 NEGATIVE NEGATIVE Final      Comment: (NOTE) SARS-CoV-2 target nucleic acids are NOT DETECTED. The SARS-CoV-2 RNA is generally detectable in upper and lower respiratory specimens during the acute phase of infection. Negative results do not preclude SARS-CoV-2 infection, do not rule out co-infections with other pathogens, and should not be used as the sole basis for treatment or other patient management decisions. Negative results must be combined with clinical observations, patient history, and epidemiological information. The expected result is Negative. Fact Sheet for Patients: SugarRoll.be Fact Sheet for Healthcare Providers: https://www.woods-mathews.com/ This test is not yet approved or cleared by the [place] FDA and  has been authorized for detection and/or diagnosis of SARS-CoV-2 by FDA under an Emergency Use Authorization (EUA). This EUA will remain  in effect (meaning this test can be used) for the duration of the COVID-19 declaration under Section 56 4(b)(1) of the Act, 21 U.S.C. section 360bbb-3(b)(1), unless the authorization is terminated or revoked sooner. Performed at [facility], [place] [address], [place], [place] [address]      Radiology Studies: No results found.  Scheduled Meds:  apixaban  5 mg Oral BID   atorvastatin  20 mg Oral Daily   clonazePAM  1 mg Oral TID  feeding supplement (ENSURE ENLIVE)  237 mL Oral Q24H   feeding supplement (PRO-STAT SUGAR FREE 64)  30 mL Oral TID BM   furosemide  40 mg Oral Daily   gabapentin  100 mg Oral QHS   ipratropium-albuterol  3 mL Nebulization BID   metoprolol succinate  25 mg Oral Daily   multivitamin with minerals  1 tablet Oral Daily   sodium chloride  1 g Oral TID WC   spironolactone  25 mg Oral Daily   tamsulosin  0.4 mg Oral QPM   Continuous Infusions:   LOS: 9 days    Flora Lipps, MD [facility]  12/05/2018, 7:06 AM  .

## 2018-12-05 NOTE — NC FL2 (Signed)
[place] LEVEL OF CARE SCREENING TOOL     IDENTIFICATION  Patient Name: Benjamin [name] Birthdate: 22-Mar-1945 Sex: male Admission Date (Current Location): 11/26/2018  [place] and [place] Number:  Kathleen Argue [phone] Circle and Address:  [name],  [place] [address], [place]      Provider Number: [id number]  Attending Physician Name and Address:  Flora Lipps, MD  Relative Name and Phone Number:  Devean Skoczylas [phone]    Current Level of Care: SNF Recommended Level of Care: [place] Prior Approval Number:    Date Approved/Denied:   PASRR Number:    Discharge Plan: SNF    Current Diagnoses: Patient Active Problem List   Diagnosis Date Noted  . Pressure injury of skin 12/04/2018  . 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 Lumbar stenosis with neurogenic claudication 11/26/2018  . Paraplegia ([place]) 11/26/2018  . Hyponatremia 11/26/2018  . Chronic anxiety 11/23/2018  . Chronic constipation 08/31/2018  . Chronic non-seasonal allergic rhinitis 08/08/2018  . Urinary retention 08/05/2018  . UTI due to extended-spectrum beta lactamase (ESBL) producing Escherichia coli 08/01/2018  . Cauda equina syndrome ([place]) 07/25/2018  . Bilateral lower extremity edema 07/25/2018  . Incarcerated inguinal hernia 05/24/2018  . Complete heart block ([place]) 08/29/2016  . 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 Pneumothorax after biopsy 08/24/2015  . Hyperlipidemia 09/14/2014  . Mobitz type 1 second degree atrioventricular block 10/17/2012  . Hx of NSTEMI tx with DES to RCA and DES to LCx in 2014 10/16/2012  . Chest pain, atypical 07/02/2012  . Malignant neoplasm of hilus of lung ([place])   . Adenopathy   . Anxiety   . Arthritis   . Hx of melanoma of skin   . Permanent atrial fibrillation   . CAD (coronary artery disease)     Orientation RESPIRATION BLADDER Height & Weight      Self, Time, Situation, Place    Incontinent, Indwelling catheter Weight: 83.8 kg Height:  5\' 11"  (180.3 cm)  BEHAVIORAL SYMPTOMS/MOOD NEUROLOGICAL BOWEL NUTRITION STATUS      Incontinent Diet  AMBULATORY STATUS COMMUNICATION OF NEEDS Skin   Total Care(Paraplegic) Verbally Skin abrasions, Other (Comment)(Stage 2 pressure injury sacrum,buttocks-foam dsg)                       Personal Care Assistance Level of Assistance  Bathing, Feeding, Dressing, Total care Bathing Assistance: Maximum assistance Feeding assistance: Maximum assistance Dressing Assistance: Maximum assistance Total Care Assistance: Maximum assistance   Functional Limitations Info  Sight, Hearing, Speech(Eyeglasses-reading) Sight Info: Impaired Hearing Info: Adequate Speech Info: Adequate    SPECIAL CARE FACTORS FREQUENCY  PT (By licensed PT), OT (By licensed OT)     PT Frequency: 5x week OT Frequency: 5x week            Contractures Contractures Info: Not present    Additional Factors Info  Code Status, Allergies Code Status Info: Full code Allergies Info: Codeine           Current Medications (12/05/2018):  This is the current hospital active medication list Current Facility-Administered Medications  Medication Dose Route Frequency Provider Last Rate Last Dose  . acetaminophen (TYLENOL) tablet 650 mg  650 mg Oral Q6H PRN Kristeen Miss, MD   650 mg at 12/05/18 0539  . apixaban (ELIQUIS) tablet 5 mg  5 mg Oral BID [name], MD   5 mg at 12/04/18  2140  . atorvastatin (LIPITOR) tablet 20 mg  20 mg Oral Daily Kristeen Miss, MD   20 mg at 12/04/18 1026  . clonazePAM (KLONOPIN) tablet 1 mg  1 mg Oral TID Kristeen Miss, MD   1 mg at 12/04/18 2140  . feeding supplement (ENSURE ENLIVE) (ENSURE ENLIVE) liquid 237 mL  237 mL Oral Q24H Barton Dubois, MD      . feeding supplement (PRO-STAT SUGAR FREE 64) liquid 30 mL  30 mL Oral TID BM Barton Dubois, MD   30 mL at 12/04/18 2140  . furosemide (LASIX)  tablet 40 mg  40 mg Oral Daily Dahal, Marlowe Aschoff, MD   40 mg at 12/04/18 1026  . gabapentin (NEURONTIN) capsule 100 mg  100 mg Oral Antoine Poche, MD   100 mg at 12/04/18 2140  . HYDROcodone-acetaminophen (NORCO/VICODIN) 5-325 MG per tablet 1-2 tablet  1-2 tablet Oral Q4H PRN Kristeen Miss, MD   1 tablet at 12/04/18 2304  . ipratropium-albuterol (DUONEB) 0.5-2.5 (3) MG/3ML nebulizer solution 3 mL  3 mL Nebulization BID Kristeen Miss, MD   3 mL at 12/04/18 1955  . magnesium hydroxide (MILK OF MAGNESIA) suspension 30 mL  30 mL Oral Daily PRN Kristeen Miss, MD      . metoprolol succinate (TOPROL-XL) 24 hr tablet 25 mg  25 mg Oral Daily Barb Merino, MD   25 mg at 12/04/18 1026  . multivitamin with minerals tablet 1 tablet  1 tablet Oral Daily Barton Dubois, MD   1 tablet at 12/04/18 1405  . nitroGLYCERIN (NITROSTAT) SL tablet 0.4 mg  0.4 mg Sublingual Q5 Min x 3 PRN Kristeen Miss, MD      . sodium chloride tablet 1 g  1 g Oral TID WC Barb Merino, MD   1 g at 12/04/18 1722  . spironolactone (ALDACTONE) tablet 25 mg  25 mg Oral Daily Kristeen Miss, MD   25 mg at 12/04/18 1026  . tamsulosin (FLOMAX) capsule 0.4 mg  0.4 mg Oral QPM Kristeen Miss, MD   0.4 mg at 12/04/18 1722     Discharge Medications: Please see discharge summary for a list of discharge medications.  Relevant Imaging Results:  Relevant Lab Results:   Additional Information ss#[id number] [place], [place]

## 2018-12-05 NOTE — Consult Note (Signed)
Consultation Note Date: 12/05/2018   Patient Name: Benjamin [name]  DOB: 1945/03/31  MRN: [id number]  Age / Sex: 73 y.o., male  PCP: Hennie Duos, MD Referring Physician: Flora Lipps, MD  Reason for Consultation: Establishing goals of care  HPI/Patient Profile: 73 y.o. male  admitted on 11/26/2018   Clinical Assessment and Goals of Care: 73 year old gentleman with a history of lung cancer.  He currently lives at [facility].  Patient admitted for cauda equina syndrome undergoing palliative radiation treatments.  Patient is suspected to have a spinal mass related to prior lung cancer versus another malignancy.  Ongoing goals of care discussions with medical oncology, palliative care also consulted.  [name] is awake alert resting in bed.  In the morning, he was in the process of being transported to radiology for radiation.  Briefly discussed in the morning, also discussed in the afternoon upon his arrival back.   Palliative medicine is specialized medical care for people living with serious illness. It focuses on providing relief from the symptoms and stress of a serious illness. The goal is to improve quality of life for both the patient and the family.  Goals of care: Broad aims of medical therapy in relation to the patient's values and preferences. Our aim is to provide medical care aimed at enabling patients to achieve the goals that matter most to them, given the circumstances of their particular medical situation and their constraints.   Discussed with patient about his current serious illness, next steps and broad goals of care.  See below.   NEXT OF KIN Brother [name] [phone] is next of kin.  SUMMARY OF RECOMMENDATIONS   Full code for now. Completing palliative radiation to spine Appreciate medical oncology follow-up and assistance with helping determine next steps, goals of care.  PMT  to continue to support/follow along. Continue current pain and non-pain symptom management. Continue to support the patient while allowing for gentle Ilona Sorrel goals of care conversations.  Patient states he feels overwhelmed with his current serious life illness, states he feels tired, has been dealing with cancer since 2003.  Offered active listening and supportive care.  Code Status/Advance Care Planning:  Full code    Symptom Management:   Patient's medication history reviewed.  Patient is on Tylenol, Klonopin, Neurontin and Norco.  Continue the same.  Palliative Prophylaxis:   Delirium Protocol  Additional Recommendations (Limitations, Scope, Preferences):  Full Scope Treatment  Psycho-social/Spiritual:   Desire for further Chaplaincy support:yes  Additional Recommendations: Caregiving  Support/Resources  Prognosis:   Unable to determine  Discharge Planning: To Be Determined      Primary Diagnoses: Present on Admission: . Paraplegia ([place]) . Anxiety . CAD (coronary artery disease) . Permanent atrial fibrillation . Hyperlipidemia . 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 The following aspects are pertinent.  Past Medical History:  Diagnosis Date  . Adenopathy    RIGHT  ADRENAL  . Anxiety   . Arthritis    KNEES/HANDS  . CAD (coronary artery disease)    a. Prior known CTO of LCX with recanalizationby cath 2008;  b. 09/2012 NSTEMI/Cath/PCI: LM nl, LAD 47m, D1 50p, LCX 100p (2.75x16 Promus Premier DES), 80-68m, OM1 30, RCA 33m (3.0x16 Promus Premier DES), EF 50-55%.  [name] History of echocardiogram    Echo 2/18: Moderate LVH, EF 55-60, normal wall motion, trivial AI, moderate LAE, mild RVE, mild RAE, mildly elevated pulmonary pressure  . Hx of melanoma of skin   . 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 Malignant neoplasm of hilus of lung  (HCC)    NON-SMALL-CELL    . Non-small cell lung cancer ([place])    a. s/p resection and XRT.  [name] PAF (paroxysmal atrial fibrillation) ([place])    a. not currently on anticoagulation (09/2012)  . Presence of permanent cardiac pacemaker   . 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 Uses walker    Social History   Socioeconomic History  . Marital status: Divorced    Spouse name: Not on file  . Number of children: Not on file  . Years of education: Not on file  . Highest education level: Not on file  Occupational History  . Occupation: Retired    Fish farm manager: [employer]  . Financial resource strain: Not on file  . Food insecurity    Worry: Not on file    Inability: Not on file  . Transportation needs    Medical: Not on file    Non-medical: Not on file  Tobacco Use  . Smoking status: Former Smoker    Packs/day: 1.00    Years: 40.00    Pack years: 40.00    Types: Cigarettes    Quit date: 08/15/2001    Years since quitting: 17.3  . Smokeless tobacco: Never Used  Substance and Sexual Activity  . Alcohol use: No  . Drug use: No  . Sexual activity: Not Currently  Lifestyle  . Physical activity    Days per week: Not on file    Minutes per session: Not on file  . Stress: Not on file  Relationships  . Social Herbalist on phone: Not on file    Gets together: Not on file    Attends religious service: Not on file    Active member of club or organization: Not on file    Attends meetings of clubs or organizations: Not on file    Relationship status: Not on file  Other Topics Concern  . Not on file  Social History Narrative  . Not on file   Family History  Problem Relation Age of Onset  . Heart attack Father   . Cancer Mother        lung  . Cancer Cousin        breast   Scheduled Meds: . apixaban  5 mg Oral BID  . atorvastatin  20 mg Oral Daily  . clonazePAM  1 mg Oral TID  . feeding supplement (ENSURE ENLIVE)  237 mL Oral Q24H   . feeding supplement (PRO-STAT SUGAR FREE 64)  30 mL Oral TID BM  . furosemide  40 mg Oral Daily  . gabapentin  100 mg Oral QHS  . ipratropium-albuterol  3 mL Nebulization BID  . metoprolol succinate  25 mg Oral Daily  . multivitamin with minerals  1 tablet Oral Daily  . sodium chloride  1 g Oral TID WC  . spironolactone  25 mg Oral Daily  . tamsulosin  0.4 mg Oral QPM   Continuous Infusions: PRN Meds:.acetaminophen, HYDROcodone-acetaminophen, magnesium hydroxide, nitroGLYCERIN Medications Prior to Admission:  Prior to Admission medications   Medication Sig Start Date End Date Taking? Authorizing Provider  acetaminophen (TYLENOL) 325 MG tablet Take 2 tablets (650 mg total) by mouth every 6 (six) hours as needed for mild pain (or temp > 100). 05/28/18  Yes Rayburn, Claiborne Billings A, PA-C  atorvastatin (LIPITOR) 20 MG tablet Take 20 mg by mouth at bedtime.    Yes [provider]  Janne Lab Oil [facility]) OINT Apply 1 application topically See admin instructions. Apply to coccyx /sacrum and bilateral buttocks every shift and as needed for blanchable erythema and protection 08/28/18  Yes [provider]  clonazePAM (KLONOPIN) 1 MG tablet Take 1 tablet (1 mg total) by mouth 3 (three) times daily. 11/04/18  Yes Gerlene Fee, NP  furosemide (LASIX) 40 MG tablet Take 40 mg by mouth daily. 09/04/18  Yes [provider]  gabapentin (NEURONTIN) 100 MG capsule Take 100 mg by mouth at bedtime.   Yes [provider]  ipratropium-albuterol (DUONEB) 0.5-2.5 (3) MG/3ML SOLN Take 3 mLs by nebulization 2 (two) times daily. 11/18/18 12/02/18 Yes [provider]  magnesium hydroxide (MILK OF MAGNESIA) 400 MG/5ML suspension Take 30 mLs by mouth daily as needed for mild constipation. 08/20/18  Yes [provider]  metoprolol succinate (TOPROL-XL) 25 MG 24 hr tablet Take 25 mg by mouth daily.   Yes [provider]  nitroGLYCERIN (NITROSTAT) 0.4 MG SL tablet  Place 1 tablet (0.4 mg total) under the tongue every 5 (five) minutes x 3 doses as needed for chest pain. 10/18/12  Yes Theora Gianotti, NP  NON FORMULARY See admin instructions. ACE Wraps to BLE d/t increased edema- On during the day off at [facility]   Yes [provider]  polyethylene glycol (MIRALAX / GLYCOLAX) 17 g packet Take 17 g by mouth daily. 08/21/18  Yes [provider]  Silver (ALLEVYN AG GENTLE BORDER EX) Apply topically See admin instructions. Apply to bilateral buttocks after cleansing with normal saline   Yes [provider]  spironolactone (ALDACTONE) 25 MG tablet Take 25 mg by mouth daily.   Yes [provider]  tamsulosin (FLOMAX) 0.4 MG CAPS capsule Take 0.4 mg by mouth every evening. 08/20/18  Yes [provider]  apixaban (ELIQUIS) 5 MG TABS tablet Take 5 mg by mouth 2 (two) times daily. 07/31/18   [provider]  NON FORMULARY Diet Type: Regular,  NAS, Consistent Carbohydrate 07/31/18   [provider]   Allergies  Allergen Reactions  . Codeine Other (See Comments)    Dizziness, stomach pain- "Allergic," per MAR   Review of Systems +numbness in legs  Physical Exam GENERAL:alert, no distress and comfortable LUNGS: clear to auscultation and percussion with normal breathing effort HEART: regular rate & rhythm and no murmurs and no lower extremity edema ABDOMEN:abdomen soft, non-tender and normal bowel sounds Musculoskeletal: cannot move lower extremities.   NEURO: alert & oriented x 3. No sensation from mid thigh down to feet.  Patient states that he has some sensation in his upper thighs and it feels like as if sandpaper is rubbing against his skin.  Vital Signs: BP 130/76 (BP Location: Right Arm)   Pulse 63   Temp (!) 97.5 F (36.4 C) (Oral)   Resp 19   Ht 5'  11" (1.803 m)   Wt 83.8 kg   SpO2 99%   BMI 25.77 kg/m  Pain Scale: 0-10 POSS *See Group Information*: 1-Acceptable,Awake and alert Pain  Score: 3    SpO2: SpO2: 99 % O2 Device:SpO2: 99 % O2 Flow Rate: .   IO: Intake/output summary:   Intake/Output Summary (Last 24 hours) at 12/05/2018 1517 Last data filed at 12/05/2018 1300 Gross per 24 hour  Intake 600 ml  Output 1250 ml  Net -650 ml    LBM: Last BM Date: 12/05/18 Baseline Weight: Weight: 83.8 kg Most recent weight: Weight: 83.8 kg     Palliative Assessment/Data:   PPS 40%  Time In:  12 Time Out:  1300 Time Total:  60 min.  Greater than 50%  of this time was spent counseling and coordinating care related to the above assessment and plan.  Signed by: Loistine Chance, MD  [phone] Please contact Palliative Medicine Team phone at [phone] for questions and concerns.  For individual provider: See Shea Evans

## 2018-12-06 ENCOUNTER — Ambulatory Visit
Admit: 2018-12-06 | Discharge: 2018-12-06 | Disposition: A | Discharge status: Home / Self Care | Payer: Medicare Other | Attending: Radiation Oncology | Admitting: Radiation Oncology

## 2018-12-06 ENCOUNTER — Encounter (HOSPITAL_COMMUNITY): Payer: Self-pay

## 2018-12-06 NOTE — Progress Notes (Signed)
PT Cancellation Note  Patient Details Name: Benjamin [name] MRN: [id number] DOB: 10/30/1945   Cancelled Treatment:    Reason Eval/Treat Not Completed: Pain limiting ability to participate - pt politely refuses PT this session, reports 8/10 back pain with no relief. Pt requesting PT check back next week.  Julien Girt, PT Acute Rehabilitation Services Pager [phone]  Office [phone]    Roxine Caddy D Elonda Husky 12/06/2018, 3:31 PM

## 2018-12-06 NOTE — Progress Notes (Addendum)
PROGRESS NOTE    Benjamin [name]  KCL:[id number]  DOB: 09/14/1945  DOA: 11/26/2018 PCP: Hennie Duos, MD  Brief Narrative:  73 y.o.malewith PMH of spinal stenosis,mealnoma,non-small cell lung cancer and paroxysmal atrial fibrillation on Eliquis at home presented to the ED on9/8/2020with progressive weakness in the lower extremities since last May.Patient had severe spondylitic stenosis at L4-L5.  About 4 months ago he underwent surgical decompression of the severe stenosis and he was sent off to rehab. Despite efforts at rehab his legs became progressively weaker with sensory changes as well as bowel and bladder incontinence. Patient was evaluated by NS, a myelogram was performed on 9/8, as the patient had become near-complete paraplegic, which revealed high-grade block at the thoracolumbar junction. Patient  has history of lung cancer, he had radiation therapies and treatment at least 6 times for the cancer since 2003. MRI thoracic and lumbar spine showedenhancing mass lesion in the conus medullaris. Multiple small enhancing masses along the surface of the cord in the upper and midthoracic spine. 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 MRI brain showed no metastases to the brain or cerebral meninges.Patient on the direction of Dr Ellene Route, admitted to medical service with neurology,neurosurgeryand radiation oncology consultations and planned for 10 days of palliative radiation inpatient treatments due to difficulty with transportation.Oncology has been having discussions with patient about further plans/prognosis.  Palliative care on board for discussion about goals of care. Patient had a family meeting yesterday with his brother with palliative care.  Subjective:  Patient appears comfortable. Denies any acute c/o. Saturating well on RA  Objective: Vitals:   12/06/18 2050 12/06/18 2052 12/07/18 0401 12/07/18 0755  BP:  109/70 103/63   Pulse:  61 62    Resp:  18 20   Temp:  98 F (36.7 C) (!) 97.4 F (36.3 C)   TempSrc:  Oral Oral   SpO2: 96% 99% 94% 96%  Weight:      Height:        Intake/Output Summary (Last 24 hours) at 12/07/2018 0818 Last data filed at 12/07/2018 0500 Gross per 24 hour  Intake 0 ml  Output 1835 ml  Net -1835 ml   Filed Weights   12/03/18 1451  Weight: 83.8 kg    Physical Examination:  General exam: Appears calm and comfortable  Respiratory system: Clear to auscultation. Respiratory effort normal. Cardiovascular system: S1 & S2 heard, RRR. No JVD, murmurs, rubs, gallops or clicks. No pedal edema. Gastrointestinal system: Abdomen is nondistended, soft and nontender. Normal bowel sounds heard. +Foley catheter draining clear urine Central nervous system: Alert and oriented. No focal neurological deficits. Extremities: Paraplegic with 0/4 strength in LE Skin: Sacral and buttocks stage II pressure ulcer,No rashes Psychiatry: Judgement and insight appear normal. Mood & affect appropriate.     Data Reviewed: I have personally reviewed following labs and imaging studies  CBC: Recent Labs  Lab 12/01/18 0324 12/05/18 0724 12/07/18 0409  WBC 7.4 7.9 7.8  NEUTROABS 5.1  --   --   HGB 14.0 13.9 14.2  HCT 40.5 42.0 43.0  MCV 85.8 88.2 88.1  PLT 207 179 494   Basic Metabolic Panel: Recent Labs  Lab 12/01/18 0324 12/05/18 0724 12/07/18 0409  NA 129* 127* 126*  K 4.3 4.0 4.3  CL 96* 93* 93*  CO2 25 27 23   GLUCOSE 107* 113* 110*  BUN 27* 30* 28*  CREATININE 0.72 0.81 0.68  CALCIUM 8.8* 8.7* 8.5*  MG  --  1.8  --    GFR: Estimated Creatinine Clearance: 85 mL/min (by C-G formula based on SCr of 0.68 mg/dL). Liver Function Tests: Recent Labs  Lab 12/05/18 0724  AST 14*  ALT 15  ALKPHOS 90  BILITOT 0.7  PROT 6.3*  ALBUMIN 3.3*   No results for input(s): LIPASE, AMYLASE in the last 168 hours. No results for input(s): AMMONIA in the last 168 hours. Coagulation Profile: No results for  input(s): INR, PROTIME in the last 168 hours. Cardiac Enzymes: No results for input(s): CKTOTAL, CKMB, CKMBINDEX, TROPONINI in the last 168 hours. BNP (last 3 results) No results for input(s): PROBNP in the last 8760 hours. HbA1C: No results for input(s): HGBA1C in the last 72 hours. CBG: No results for input(s): GLUCAP in the last 168 hours. Lipid Profile: No results for input(s): CHOL, HDL, LDLCALC, TRIG, CHOLHDL, LDLDIRECT in the last 72 hours. Thyroid Function Tests: No results for input(s): TSH, T4TOTAL, FREET4, T3FREE, THYROIDAB in the last 72 hours. Anemia Panel: No results for input(s): VITAMINB12, FOLATE, FERRITIN, TIBC, IRON, RETICCTPCT in the last 72 hours. Sepsis Labs: No results for input(s): PROCALCITON, LATICACIDVEN in the last 168 hours.  Recent Results (from the past 240 hour(s))  SARS CORONAVIRUS 2 (TAT 6-24 HRS) Nasopharyngeal Nasopharyngeal Swab     Status: None   Collection Time: 11/27/18  8:51 AM   Specimen: Nasopharyngeal Swab  Result Value Ref Range Status   SARS Coronavirus 2 NEGATIVE NEGATIVE Final    Comment: (NOTE) SARS-CoV-2 target nucleic acids are NOT DETECTED. The SARS-CoV-2 RNA is generally detectable in upper and lower respiratory specimens during the acute phase of infection. Negative results do not preclude SARS-CoV-2 infection, do not rule out co-infections with other pathogens, and should not be used as the sole basis for treatment or other patient management decisions. Negative results must be combined with clinical observations, patient history, and epidemiological information. The expected result is Negative. Fact Sheet for Patients: SugarRoll.be Fact Sheet for Healthcare Providers: https://www.woods-mathews.com/ This test is not yet approved or cleared by the [place] FDA and  has been authorized for detection and/or diagnosis of SARS-CoV-2 by FDA under an Emergency Use Authorization (EUA).  This EUA will remain  in effect (meaning this test can be used) for the duration of the COVID-19 declaration under Section 56 4(b)(1) of the Act, 21 U.S.C. section 360bbb-3(b)(1), unless the authorization is terminated or revoked sooner. Performed at [facility], [place] [address] [place], [place] [address]       Radiology Studies: No results found.      Scheduled Meds: . apixaban  5 mg Oral BID  . atorvastatin  20 mg Oral Daily  . clonazePAM  1 mg Oral TID  . feeding supplement (ENSURE ENLIVE)  237 mL Oral Q24H  . feeding supplement (PRO-STAT SUGAR FREE 64)  30 mL Oral TID BM  . furosemide  40 mg Oral Daily  . gabapentin  100 mg Oral QHS  . ipratropium-albuterol  3 mL Nebulization BID  . metoprolol succinate  25 mg Oral Daily  . multivitamin with minerals  1 tablet Oral Daily  . sodium chloride  1 g Oral TID WC  . spironolactone  25 mg Oral Daily  . tamsulosin  0.4 mg Oral QPM   Continuous Infusions:  Assessment & Plan:   1. Spinal mass with paraplegia/incontinence: Per Oncology, spinal mass is likely related to prior lung cancer but could be related to his prior history of melanoma versus another malignancy.  Receiving palliative RT (s/p 4/10 Rxs). Oncology discussed with patient regarding poor prognosis, low likelihood of restoring bladder function and lower extremity weakness etc. They discussed option of comfort care/hospice versus diagnostic LP or biopsy of spinal mass to confirm tumor diagnosis /consider systemic treatment options.   He is unsure of how he wishes to proceed and in discussions with palliative care team. Awaiting SNF.   2.Neurogenic bladder: On indwelling Foley catheter-Exchanged.  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  Fluid restriction at 1500 ml/day. Na of 128-->129-->127-->126. Will tighten fluid restriction to 1227ml  4.Chronic diastolic heart failure :Compensated at this time.  Continue Lasix and Aldactone.   5.History of complete heart block status post pacemaker-Stable.  6.History of  paroxysmal atrial fibrillation-Continue metoprolol and Eliquis.  Currently in normal sinus rhythm.   7.Benign prostatic hypertrophy- Continue Flomax. On indwelling Foley for #2  8. Sacral and buttocks stage II pressure ulcer present on admission. Continue wound care and prevention protocol.      DVT prophylaxis: On Eliquis Code Status: Full code Family / Patient Communication: Palliative care discussing. No family bedside today Disposition Plan: awaiting SNF     LOS: 11 days    Time spent:     Guilford Shi, MD [facility] Pager [phone]  If 7PM-7AM, please contact night-coverage [email or web address] Password [facility] 12/07/2018, 8:18 AM

## 2018-12-06 NOTE — Progress Notes (Addendum)
PROGRESS NOTE    Benjamin [name]  IEP:[id number] DOB: Nov 25, 1945 DOA: 11/26/2018 PCP: Hennie Duos, MD     Brief Narrative:  73 y.o.malewith PMH of non-small cell lung cancer and paroxysmal atrial fibrillation on Eliquis at home presented to the ED on9/8/2020for further evaluation of bilateral lower extremity thoracic paralysis with sensory loss since last May. He started having some symptoms since April, rapidly progressive bilateral lower extremity weakness and sensory changes as well as bowel and bladder incontinence. Patient underwent lumbar decompression surgery. He did not have improvement of symptoms. Patient also has history of lung cancer, he had radiation therapies and treatment at least 6 times for the cancer since 2003 as per patient. Due to persistent symptoms and need for further management, he was brought to the hospital by neurosurgery, Dr. Ellene Route. MRI thoracic and lumbar spine showed enhancing mass lesion in the conus medullaris. Multiple small enhancing masses along the surface of the cord in the upper and midthoracic spine. 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 MRI brain showed no metastases to the brain or cerebral meninges. Admitted to medical service with neurology, neurosurgery and radiation oncology consultation. Patient is planned for 10 days of palliative radiation treatment at [facility].   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 MRI demonstrating multiple mass lesions in thoracic and lumbar spine, likely metastatic lung cancer.  Currently getting palliative radiation.  Continue pain management.  Oncology  on board and has been having discussion about further care.  Palliative care on board for discussion about goals of care. Patient had a family meeting yesterday with his brother with palliative care. Patient is still unsure about the route of care-not sure about biopsy but asking how we made diagnosis of spinal mets.  neurogenic bladder -Continue indwelling Foley catheter.  Exchanged Foley catheter.  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  Fluid restriction at 1500 ml/day. Na of 660  chronic diastolic heart failure Compensated at this time.  Continue Lasix and Aldactone.  history of complete heart block status post pacemaker -Stable.  History of  paroxysmal atrial fibrillation -Continue metoprolol and Eliquis.  Currently in normal sinus rhythm.   Benign prostatic hypertrophy.  Continue Flomax.  Sacral and buttocks stage II pressure ulcer present on admission. Continue wound care and prevention protocol.    DVT prophylaxis: Eliquis  Code Status: Full  Family Communication: None  Disposition Plan: Currently getting palliative radiation.  Exchanged Foley catheter.  Follow palliative care and oncology service recommendations.  Ultimate plan is skilled nursing facility on discharge.  It appears to me that patient will potentially want to be discharged to skilled nursing facility and not undergo aggressive biopsy and lumbar puncture.  Will get COVID-19 retesting in the interim.  Consultants:   -Oncology  -Neurosurgery  -Radiation oncology  -Palliative care.  Procedures:   Palliative radiation  Antimicrobials:  Anti-infectives (From admission, onward)   None     Subjective: Patient complains of mild back pain.  Denies any fever, chills or rigor.  Denies shortness of breath cough.  Objective: Vitals:   12/06/18 0553 12/06/18 1016 12/06/18 1152 12/06/18 1231  BP: 127/72 131/75  130/77  Pulse: 60 71  60  Resp: 18   20  Temp: 98 F (36.7 C)    97.9 F (36.6 C)  TempSrc: Oral   Oral  SpO2: 99%  97% 100%  Weight:      Height:        Intake/Output Summary (Last 24 hours) at 12/06/2018 1246 Last data filed at 12/06/2018 1209 Gross per 24 hour  Intake 440 ml  Output 2175 ml  Net -1735 ml   Filed Weights   12/03/18 1451  Weight: 83.8 kg    Examination: General:  Average built, not in obvious distress, paraplegic. HENT: Normocephalic, pupils equally reacting to light and accommodation.  No scleral pallor or icterus noted. Oral mucosa is moist.  Chest:  Clear breath sounds.  Diminished breath sounds bilaterally. No crackles or wheezes.  CVS: S1 &S2 heard. No murmur.  Regular rate and rhythm. Abdomen: Soft, nontender, nondistended.  Bowel sounds are heard.  Liver is not palpable, no abdominal mass palpated Extremities: No cyanosis, clubbing or edema.  Peripheral pulses are palpable. Psych: Alert, awake and oriented, normal mood CNS:  No cranial nerve deficits.  Bilateral lower extremity diminished sensation below the knee.  Paraplegia noted bilaterally.    Skin: Stage II sacral ulceration.   Data Reviewed: I have personally reviewed following labs and imaging studies  CBC: Recent Labs  Lab 12/01/18 0324 12/05/18 0724  WBC 7.4 7.9  NEUTROABS 5.1  --   HGB 14.0 13.9  HCT 40.5 42.0  MCV 85.8 88.2  PLT 207 588   Basic Metabolic Panel: Recent Labs  Lab 11/30/18 0320 12/01/18 0324 12/05/18 0724  NA 128* 129* 127*  K 4.2 4.3 4.0  CL 94* 96* 93*  CO2 25 25 27   GLUCOSE 106* 107* 113*  BUN 21 27* 30*  CREATININE 0.76 0.72 0.81  CALCIUM 8.6* 8.8* 8.7*  MG  --   --  1.8   GFR: Estimated Creatinine Clearance: 83.9 mL/min (by C-G formula based on SCr of 0.81 mg/dL).  Urine analysis:    Component Value Date/Time   COLORURINE YELLOW 08/04/2018 [place] 08/04/2018 1338   LABSPEC 1.017 08/04/2018 1338   PHURINE 6.0 08/04/2018 1338   GLUCOSEU NEGATIVE 08/04/2018 1338   HGBUR SMALL (A) 08/04/2018  1338   Register 08/04/2018 [place] 08/04/2018 [place] 08/04/2018 1338   NITRITE NEGATIVE 08/04/2018 [place] 08/04/2018 1338    Recent Results (from the past 240 hour(s))  SARS CORONAVIRUS 2 (TAT 6-24 HRS) Nasopharyngeal Nasopharyngeal Swab     Status: None   Collection Time: 11/27/18  8:51 AM   Specimen: Nasopharyngeal Swab  Result Value Ref Range Status   SARS Coronavirus 2 NEGATIVE NEGATIVE Final    Comment: (NOTE) SARS-CoV-2 target nucleic acids are NOT DETECTED. The SARS-CoV-2 RNA is generally detectable in upper and lower respiratory specimens during the acute phase of infection. Negative results do not preclude SARS-CoV-2 infection, do not rule out co-infections with other pathogens, and should not be used as the sole basis for treatment or other patient management decisions. Negative results must be combined with clinical observations, patient history, and epidemiological information. The expected result is Negative. Fact Sheet for Patients: SugarRoll.be Fact Sheet for Healthcare Providers: https://www.woods-mathews.com/ This test is not yet approved or cleared by the [place] FDA and  has been authorized for detection and/or diagnosis of SARS-CoV-2 by FDA under an Emergency Use Authorization (EUA). This EUA  will remain  in effect (meaning this test can be used) for the duration of the COVID-19 declaration under Section 56 4(b)(1) of the Act, 21 U.S.C. section 360bbb-3(b)(1), unless the authorization is terminated or revoked sooner. Performed at [facility], [place] [address] [place], [place] [address]      Radiology Studies: No results found.  Scheduled Meds: . apixaban  5 mg Oral BID  . atorvastatin  20 mg Oral Daily  . clonazePAM  1 mg Oral TID  . feeding supplement (ENSURE ENLIVE)  237 mL Oral Q24H  . feeding supplement (PRO-STAT SUGAR FREE  64)  30 mL Oral TID BM  . furosemide  40 mg Oral Daily  . gabapentin  100 mg Oral QHS  . ipratropium-albuterol  3 mL Nebulization BID  . metoprolol succinate  25 mg Oral Daily  . multivitamin with minerals  1 tablet Oral Daily  . sodium chloride  1 g Oral TID WC  . spironolactone  25 mg Oral Daily  . tamsulosin  0.4 mg Oral QPM   Continuous Infusions:   LOS: 10 days    Flora Lipps, MD [facility]  12/06/2018, 12:46 PM  .

## 2018-12-06 NOTE — Care Management Important Message (Signed)
Important Message  Patient Details IM Letter given to Dessa Phi RN to present to the Patient Name: Benjamin [name] MRN: [id number] Date of Birth: Feb 29, 1946   Medicare Important Message Given:  Yes     Kerin Salen 12/06/2018, 11:45 AM

## 2018-12-07 LAB — CBC
HCT: 43 % (ref 39.0–52.0)
Hemoglobin: 14.2 g/dL (ref 13.0–17.0)
MCH: 29.1 pg (ref 26.0–34.0)
MCHC: 33 g/dL (ref 30.0–36.0)
MCV: 88.1 fL (ref 80.0–100.0)
Platelets: 197 10*3/uL (ref 150–400)
RBC: 4.88 MIL/uL (ref 4.22–5.81)
RDW: 13.9 % (ref 11.5–15.5)
WBC: 7.8 10*3/uL (ref 4.0–10.5)
nRBC: 0 % (ref 0.0–0.2)

## 2018-12-07 LAB — BASIC METABOLIC PANEL
Anion gap: 10 (ref 5–15)
BUN: 28 mg/dL — ABNORMAL HIGH (ref 8–23)
CO2: 23 mmol/L (ref 22–32)
Calcium: 8.5 mg/dL — ABNORMAL LOW (ref 8.9–10.3)
Chloride: 93 mmol/L — ABNORMAL LOW (ref 98–111)
Creatinine, Ser: 0.68 mg/dL (ref 0.61–1.24)
GFR calc Af Amer: >60 mL/min (ref >60)
GFR calc non Af Amer: >60 mL/min (ref >60)
Glucose, Bld: 110 mg/dL — ABNORMAL HIGH (ref 70–99)
Potassium: 4.3 mmol/L (ref 3.5–5.1)
Sodium: 126 mmol/L — ABNORMAL LOW (ref 135–145)

## 2018-12-08 DIAGNOSIS — Z7189 Other specified counseling: Secondary | ICD-10-CM

## 2018-12-08 DIAGNOSIS — Z515 Encounter for palliative care: Secondary | ICD-10-CM

## 2018-12-08 LAB — BASIC METABOLIC PANEL
Anion gap: 11 (ref 5–15)
BUN: 27 mg/dL — ABNORMAL HIGH (ref 8–23)
CO2: 27 mmol/L (ref 22–32)
Calcium: 8.9 mg/dL (ref 8.9–10.3)
Chloride: 91 mmol/L — ABNORMAL LOW (ref 98–111)
Creatinine, Ser: 0.76 mg/dL (ref 0.61–1.24)
GFR calc Af Amer: >60 mL/min (ref >60)
GFR calc non Af Amer: >60 mL/min (ref >60)
Glucose, Bld: 138 mg/dL — ABNORMAL HIGH (ref 70–99)
Potassium: 4.2 mmol/L (ref 3.5–5.1)
Sodium: 129 mmol/L — ABNORMAL LOW (ref 135–145)

## 2018-12-08 MED ORDER — LIDOCAINE 5 % EX PTCH
1.0000 | MEDICATED_PATCH | CUTANEOUS | Status: DC
  Administered 2018-12-08 – 2018-12-13 (×3): 1 via TRANSDERMAL
  Filled 2018-12-08 (×10): qty 1

## 2018-12-08 MED ORDER — OXYCODONE HCL 5 MG PO TABS
5.0000 mg | ORAL_TABLET | ORAL | Status: DC | PRN
  Administered 2018-12-08 – 2018-12-17 (×14): 5 mg via ORAL
  Filled 2018-12-08 (×16): qty 1

## 2018-12-08 NOTE — Progress Notes (Signed)
PROGRESS NOTE    Benjamin [name]  QBH:[id number]  DOB: 09/25/1945  DOA: 11/26/2018 PCP: Hennie Duos, MD  Brief Narrative:  73 y.o.malewith PMH of spinal stenosis,mealnoma,non-small cell lung cancer and paroxysmal atrial fibrillation on Eliquis at home presented to the ED on9/8/2020with progressive weakness in the lower extremities since last May.Patient had severe spondylitic stenosis at L4-L5.  About 4 months ago he underwent surgical decompression of the severe stenosis and he was sent off to rehab. Despite efforts at rehab his legs became progressively weaker with sensory changes as well as bowel and bladder incontinence. Patient was evaluated by NS, a myelogram was performed on 9/8, as the patient had become near-complete paraplegic, which revealed high-grade block at the thoracolumbar junction. Patient  has history of lung cancer, he had radiation therapies and treatment at least 6 times for the cancer since 2003. MRI thoracic and lumbar spine showedenhancing mass lesion in the conus medullaris. Multiple small enhancing masses along the surface of the cord in the upper and midthoracic spine. 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 MRI brain showed no metastases to the brain or cerebral meninges.Patient on the direction of Dr Ellene Route, admitted to medical service with neurology,neurosurgeryand radiation oncology consultations and planned for 10 days of palliative radiation inpatient treatments due to difficulty with transportation.Oncology has been having discussions with patient about further plans/prognosis.  Palliative care on board for discussion about goals of care. Patient had a family meeting yesterday with his brother with palliative care.  Subjective:  Patient appears comfortable. Denies any acute c/o. Saturating well on RA  Objective: Vitals:   12/07/18 1407 12/07/18 2045 12/08/18 0504 12/08/18 0746  BP: 123/80 112/74 115/75   Pulse:  61 (!) 51 60   Resp: 18 16 18    Temp: 97.6 F (36.4 C) 98 F (36.7 C) (!) 97.3 F (36.3 C)   TempSrc: Oral Oral Oral   SpO2: 97% 100% 96% 95%  Weight:      Height:        Intake/Output Summary (Last 24 hours) at 12/08/2018 1243 Last data filed at 12/08/2018 0600 Gross per 24 hour  Intake 1200 ml  Output 1350 ml  Net -150 ml   Filed Weights   12/03/18 1451  Weight: 83.8 kg    Physical Examination:  General exam: Appears calm and comfortable  Respiratory system: Clear to auscultation. Respiratory effort normal. Cardiovascular system: S1 & S2 heard, RRR. No JVD, murmurs, rubs, gallops or clicks. No pedal edema. Gastrointestinal system: Abdomen is nondistended, soft and nontender. Normal bowel sounds heard. +Foley catheter draining clear urine Central nervous system: Alert and oriented. No focal neurological deficits. Extremities: Paraplegic with 0/4 strength in LE Skin: Sacral and buttocks stage II pressure ulcer,No rashes Psychiatry: Judgement and insight appear normal. Mood & affect appropriate.     Data Reviewed: I have personally reviewed following labs and imaging studies  CBC: Recent Labs  Lab 12/05/18 0724 12/07/18 0409  WBC 7.9 7.8  HGB 13.9 14.2  HCT 42.0 43.0  MCV 88.2 88.1  PLT 179 097   Basic Metabolic Panel: Recent Labs  Lab 12/05/18 0724 12/07/18 0409 12/08/18 0904  NA 127* 126* 129*  K 4.0 4.3 4.2  CL 93* 93* 91*  CO2 27 23 27   GLUCOSE 113* 110* 138*  BUN 30* 28* 27*  CREATININE 0.81 0.68 0.76  CALCIUM 8.7* 8.5* 8.9  MG 1.8  --   --    GFR: Estimated Creatinine Clearance: 85 mL/min (  by C-G formula based on SCr of 0.76 mg/dL). Liver Function Tests: Recent Labs  Lab 12/05/18 0724  AST 14*  ALT 15  ALKPHOS 90  BILITOT 0.7  PROT 6.3*  ALBUMIN 3.3*   No results for input(s): LIPASE, AMYLASE in the last 168 hours. No results for input(s): AMMONIA in the last 168 hours. Coagulation Profile: No results for input(s): INR, PROTIME in  the last 168 hours. Cardiac Enzymes: No results for input(s): CKTOTAL, CKMB, CKMBINDEX, TROPONINI in the last 168 hours. BNP (last 3 results) No results for input(s): PROBNP in the last 8760 hours. HbA1C: No results for input(s): HGBA1C in the last 72 hours. CBG: No results for input(s): GLUCAP in the last 168 hours. Lipid Profile: No results for input(s): CHOL, HDL, LDLCALC, TRIG, CHOLHDL, LDLDIRECT in the last 72 hours. Thyroid Function Tests: No results for input(s): TSH, T4TOTAL, FREET4, T3FREE, THYROIDAB in the last 72 hours. Anemia Panel: No results for input(s): VITAMINB12, FOLATE, FERRITIN, TIBC, IRON, RETICCTPCT in the last 72 hours. Sepsis Labs: No results for input(s): PROCALCITON, LATICACIDVEN in the last 168 hours.  No results found for this or any previous visit (from the past 240 hour(s)).    Radiology Studies: No results found.      Scheduled Meds: . apixaban  5 mg Oral BID  . atorvastatin  20 mg Oral Daily  . clonazePAM  1 mg Oral TID  . feeding supplement (ENSURE ENLIVE)  237 mL Oral Q24H  . feeding supplement (PRO-STAT SUGAR FREE 64)  30 mL Oral TID BM  . furosemide  40 mg Oral Daily  . gabapentin  100 mg Oral QHS  . ipratropium-albuterol  3 mL Nebulization BID  . metoprolol succinate  25 mg Oral Daily  . multivitamin with minerals  1 tablet Oral Daily  . sodium chloride  1 g Oral TID WC  . spironolactone  25 mg Oral Daily  . tamsulosin  0.4 mg Oral QPM   Continuous Infusions:  Assessment & Plan:   1. Spinal mass with paraplegia/incontinence: Per Oncology, spinal mass is likely related to prior lung cancer but could be related to his prior history of melanoma versus another malignancy. Receiving palliative RT (s/p 4/10 Rxs). Oncology discussed with patient regarding poor prognosis, low likelihood of restoring bladder function and lower extremity weakness etc. They discussed option of comfort care/hospice versus diagnostic LP or biopsy of spinal mass  to confirm tumor diagnosis /consider systemic treatment options.   He is unsure of how he wishes to proceed and in discussions with palliative care team. Awaiting SNF.   2.Neurogenic bladder: On indwelling Foley catheter-Exchanged.  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  Fluid restriction at 1500 ml/day. Na of 128-->129-->127-->126->129. Continue fluid restriction at 1233ml and diuretics  4.Chronic diastolic heart failure :Compensated at this time.  Continue Lasix and Aldactone.  5.History of complete heart block status post pacemaker-Stable.  6.History of  paroxysmal atrial fibrillation-Continue metoprolol and Eliquis.  Currently in normal sinus rhythm.   7.Benign prostatic hypertrophy- Continue Flomax. On indwelling Foley for #2  8. Sacral and buttocks stage II pressure ulcer present on admission. Continue wound care and prevention protocol.  9. Back pain : Patient prefers to avoid opiates as make him too sleepy. Will add lidocaine patch to see if helps.     DVT prophylaxis: On Eliquis Code Status: Full code Family / Patient Communication: Palliative care discussing. No family bedside today Disposition Plan: awaiting SNF     LOS:  12 days    Time spent: 25 min    Guilford Shi, MD [facility] Pager [phone]  If 7PM-7AM, please contact night-coverage [email or web address] Password [facility] 12/08/2018, 12:43 PM

## 2018-12-08 NOTE — Progress Notes (Signed)
Daily Progress Note   Patient Name: Benjamin [name]       Date: 12/08/2018 DOB: [birth year]  Age: 73 y.o. MRN#: [id number] Attending Physician: Guilford Shi, MD Primary Care Physician: Hennie Duos, MD Admit Date: 11/26/2018  Reason for Consultation/Follow-up: Establishing goals of care and Pain control  Subjective: Watching football with his brother Benjamin [name].  Complains of pain in legs. PPS 40%  Length of Stay: 12  Current Medications: Scheduled Meds:  . apixaban  5 mg Oral BID  . atorvastatin  20 mg Oral Daily  . clonazePAM  1 mg Oral TID  . feeding supplement (ENSURE ENLIVE)  237 mL Oral Q24H  . feeding supplement (PRO-STAT SUGAR FREE 64)  30 mL Oral TID BM  . furosemide  40 mg Oral Daily  . gabapentin  100 mg Oral QHS  . ipratropium-albuterol  3 mL Nebulization BID  . lidocaine  1 patch Transdermal Q24H  . metoprolol succinate  25 mg Oral Daily  . multivitamin with minerals  1 tablet Oral Daily  . sodium chloride  1 g Oral TID WC  . spironolactone  25 mg Oral Daily  . tamsulosin  0.4 mg Oral QPM    Continuous Infusions:   PRN Meds: acetaminophen, magnesium hydroxide, nitroGLYCERIN, oxyCODONE  Physical Exam         Awake alert No distress Pain in legs, some tingling as well, hypersensitivity.  No edema Abdomen not distended S1 S2 Regular work of breathing  Vital Signs: BP 115/75 (BP Location: Right Arm)   Pulse 60   Temp (!) 97.3 F (36.3 C) (Oral)   Resp 18   Ht 5\' 11"  (1.803 m)   Wt 83.8 kg   SpO2 95%   BMI 25.77 kg/m  SpO2: SpO2: 95 % O2 Device: O2 Device: Room Air O2 Flow Rate:    Intake/output summary:   Intake/Output Summary (Last 24 hours) at 12/08/2018 1330 Last data filed at 12/08/2018 0600 Gross per 24 hour  Intake 1200 ml  Output  1350 ml  Net -150 ml   LBM: Last BM Date: 12/07/18 Baseline Weight: Weight: 83.8 kg Most recent weight: Weight: 83.8 kg       Palliative Assessment/Data:      Patient Active Problem List   Diagnosis Date Noted  . Pressure injury of skin 12/04/2018  .  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 Lumbar stenosis with neurogenic claudication 11/26/2018  . Paraplegia ([place]) 11/26/2018  . Hyponatremia 11/26/2018  . Chronic anxiety 11/23/2018  . Chronic constipation 08/31/2018  . Chronic non-seasonal allergic rhinitis 08/08/2018  . Urinary retention 08/05/2018  . UTI due to extended-spectrum beta lactamase (ESBL) producing Escherichia coli 08/01/2018  . Cauda equina syndrome ([place]) 07/25/2018  . Bilateral lower extremity edema 07/25/2018  . Incarcerated inguinal hernia 05/24/2018  . Complete heart block ([place]) 08/29/2016  . 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 Pneumothorax after biopsy 08/24/2015  . Hyperlipidemia 09/14/2014  . Mobitz type 1 second degree atrioventricular block 10/17/2012  . Hx of NSTEMI tx with DES to RCA and DES to LCx in 2014 10/16/2012  . Chest pain, atypical 07/02/2012  . Malignant neoplasm of hilus of lung ([place])   . Adenopathy   . Anxiety   . Arthritis   . Hx of melanoma of skin   . Permanent atrial fibrillation   . CAD (coronary artery disease)     Palliative Care Assessment & Plan   Patient Profile:   Assessment: Spinal mass with paraplegia, incontinence, neurogenic bladder, mass likely related to prior lung cancer or new malignancy, receiving palliative radiation.  Has PPM for complete heart block Pressure ulcer Chronic d CHF Back pain Hypersensitivity in both legs, no sensation past mid thighs in both LE.   Recommendations/Plan:  D/C Norco and start Oxy IR PO PRN and monitor as patient complains of pain in legs and back, agree with Lidoderm patch.   Continue Klonopin and also on Gabapentin.    Difference between palliative care and hospice discussed with patient and brother, patient lives in [facility].   Patient likely would not want to pursue a diagnostic biopsy/LP for cytology. However, he hasn't completely decided. Offered active listening and supportive care, appreciate bedside RN who also participated in the family meeting.   Code Status:    Code Status Orders  (From admission, onward)         Start     Ordered   11/26/18 1701  Full code  Continuous     11/26/18 1702        Code Status History    Date Active Date Inactive Code Status Order ID Comments User Context   07/25/2018 1655 08/01/2018 0330 Full Code [id number]  Benjamin Miss, MD Inpatient   05/24/2018 2141 05/28/2018 1612 Full Code [id number]  Benjamin Riley, MD ED   08/29/2016 1004 08/30/2016 1536 Full Code [id number]  Benjamin Lance, MD Inpatient   08/29/2016 1004 08/29/2016 1004 Full Code [id number]  Benjamin Lance, MD Inpatient   08/24/2015 1552 08/28/2015 1856 Full Code [id number]  [name]   Advance Care Planning Activity    Advance Directive Documentation     Most Recent Value  Type of Advance Directive  Healthcare Power of Attorney, Living will  Pre-existing out of facility DNR order (yellow form or pink MOST form)  -  "MOST" Form in Place?  -       Prognosis:   Unable to determine  Discharge Planning:  To Be Determined  Care plan was discussed with patient, brother at bedside.   Thank you for allowing the Palliative Medicine Team to assist in the care of this patient.   Time In: 1300 Time Out: 1335 Total Time  35 Prolonged Time Billed  no  Greater than 50%  of this time was spent counseling and coordinating care related to the above assessment and plan.  Loistine Chance, MD [phone] Please contact Palliative Medicine Team phone at [phone] for questions and concerns.

## 2018-12-09 ENCOUNTER — Ambulatory Visit
Admit: 2018-12-09 | Discharge: 2018-12-09 | Disposition: A | Discharge status: Home / Self Care | Payer: Medicare Other | Attending: Radiation Oncology | Admitting: Radiation Oncology

## 2018-12-09 LAB — MAGNESIUM: Magnesium: 1.9 mg/dL (ref 1.7–2.4)

## 2018-12-09 LAB — BASIC METABOLIC PANEL
Anion gap: 8 (ref 5–15)
BUN: 32 mg/dL — ABNORMAL HIGH (ref 8–23)
CO2: 27 mmol/L (ref 22–32)
Calcium: 8.8 mg/dL — ABNORMAL LOW (ref 8.9–10.3)
Chloride: 93 mmol/L — ABNORMAL LOW (ref 98–111)
Creatinine, Ser: 0.79 mg/dL (ref 0.61–1.24)
GFR calc Af Amer: >60 mL/min (ref >60)
GFR calc non Af Amer: >60 mL/min (ref >60)
Glucose, Bld: 117 mg/dL — ABNORMAL HIGH (ref 70–99)
Potassium: 4.4 mmol/L (ref 3.5–5.1)
Sodium: 128 mmol/L — ABNORMAL LOW (ref 135–145)

## 2018-12-09 MED ORDER — DEXTROSE 50 % IV SOLN
INTRAVENOUS | Status: AC
  Filled 2018-12-09: qty 50

## 2018-12-09 NOTE — Progress Notes (Signed)
PROGRESS NOTE    Benjamin [name]  UDJ:[id number] DOB: 1945-05-11 DOA: 11/26/2018 PCP: Hennie Duos, MD   Brief Narrative:73 y.o.malewith PMH of spinal stenosis,mealnoma,non-small cell lung cancer and paroxysmal atrial fibrillation on Eliquis at home presented to the ED on9/8/2020with progressive weakness in the lower extremities since last May.Patient had severe spondylitic stenosis at L4-L5. About 4 months ago he underwent surgical decompression of the severe stenosis and he was sent off to rehab. Despite efforts at rehab his legs became progressively weaker with sensory changes as well as bowel and bladder incontinence.Patient was evaluated by NS, a myelogram was performed on 9/8, as the patient had become near-complete paraplegic, which revealed high-grade block at the thoracolumbar junction. Patient  has history of lung cancer, he had radiation therapies and treatment at least 6 times for the cancer since 2003. MRI thoracic and lumbar spine showedenhancing mass lesion in the conus medullaris. Multiple small enhancing masses along the surface of the cord in the upper and midthoracic spine. 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 MRI brain showed no metastases to the brain or cerebral meninges.Patient on the direction of Dr Ellene Route, admitted to medical service with neurology,neurosurgeryand radiation oncology consultations and planned for 10 days of palliative radiation inpatient treatments due to difficulty with transportation.Oncology has been having discussions with patient about further plans/prognosis.Palliative care on board for discussion about goals of care. Patient had a family meeting with palliative care.   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 Spinal mass with paraplegia/incontinence: Per Oncology, spinal mass is likely related to prior lung cancer but could be related to his prior history of melanomaversus another malignancy. Receiving palliative RT (s/p 5/10 Rxs). Oncology discussed with patient regarding poor prognosis, low likelihood of restoring bladder function and lower extremity weakness etc. They discussed option of comfort care/hospice versus diagnostic LP or biopsy of spinal mass to confirm tumor diagnosis /consider systemic treatment options. He is unsure of how he wishes to proceed and in discussions with palliative care team. Awaiting SNF.  Palliative care has started him on OxyIR in addition to Klonopin gabapentin and lidocaine patch.  He appears to be comfortable today.  2.Neurogenic bladder: On indwelling Foley catheter-Exchanged.  3.Hyponatremia. Sodium today 128.  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  4.Chronic diastolic heart failure :Compensated at this time. Continue Lasix and Aldactone.  5.History of complete heart block status post pacemaker-Stable.  6.History of paroxysmal atrial fibrillation-Continue metoprolol and Eliquis.  Staff reported patient had ventricular trigeminy.  Check magnesium.  Potassium 4.4.  7.Benign prostatic hypertrophy-Continue Flomax. On indwelling Foley for #2  8. Sacral and buttocks stage II pressure ulcer present on admission. Continue wound care and prevention protocol.  9. Back pain : Patient prefers to avoid opiates as make him too sleepy. Will add lidocaine patch to see if helps.   DVT prophylaxis: On Eliquis Code Status: Full code Family / Patient Communication:  None Disposition Plan: awaiting SNF Consults palliative care and oncology   Pressure Injury 12/03/18 Buttocks Right Stage II -  Partial thickness loss of dermis  presenting as a shallow open ulcer with a red, pink  wound bed without slough. reddened circular pressure ulcer (Active)  12/03/18 1505  Location: Buttocks  Location Orientation: Right  Staging: Stage II -  Partial thickness loss of dermis presenting as a shallow open ulcer with a red, pink wound bed without slough.  Wound Description (Comments): reddened circular pressure ulcer  Present on Admission: Yes     Pressure Injury 12/03/18 Sacrum Stage II -  Partial thickness loss of dermis presenting as a shallow open ulcer with a red, pink wound bed without slough. (Active)  12/03/18 1505  Location: Sacrum  Location Orientation:   Staging: Stage II -  Partial thickness loss of dermis presenting as a shallow open ulcer with a red, pink wound bed without slough.  Wound Description (Comments):   Present on Admission: Yes      Nutrition Problem: Increased nutrient needs Etiology: acute illness, chronic illness, cancer and cancer related treatments, wound healing     Signs/Symptoms: estimated needs    Interventions: Ensure Enlive (each supplement provides 350kcal and 20 grams of protein), Prostat, MVI  Estimated body mass index is 25.77 kg/m as calculated from the following:   Height as of this encounter: 5\' 11"  (1.803 m).   Weight as of this encounter: 83.8 kg.   Subjective: Patient resting in bed getting breathing treatment no specific complaints   Objective: Vitals:   12/08/18 2017 12/08/18 2032 12/09/18 0649 12/09/18 0810  BP: 119/78  122/77   Pulse: 60  98   Resp: 20  18   Temp: 98.5 F (36.9 C)  (!) 97.4 F (36.3 C)   TempSrc: Oral  Oral   SpO2: 99% 97% 98% 95%  Weight:      Height:        Intake/Output Summary (Last 24 hours) at 12/09/2018 1335 Last data filed at 12/09/2018 0900 Gross per 24 hour  Intake 1320 ml  Output 1450 ml  Net -130 ml   Filed Weights   12/03/18 1451  Weight: 83.8 kg    Examination:  General exam: Appears calm and comfortable   Respiratory system: Clear to auscultation. Respiratory effort normal. Cardiovascular system: S1 & S2 heard, RRR. No JVD, murmurs, rubs, gallops or clicks. No pedal edema. Gastrointestinal system: Abdomen is nondistended, soft and nontender. No organomegaly or masses felt. Normal bowel sounds heard. Central nervous system: Alert and oriented. No focal neurological deficits. Extremities: 0 x 5 power in both lower extremities Skin: No rashes, lesions or ulcers Psychiatry: Judgement and insight appear normal. Mood & affect appropriate.     Data Reviewed: I have personally reviewed following labs and imaging studies  CBC: Recent Labs  Lab 12/05/18 0724 12/07/18 0409  WBC 7.9 7.8  HGB 13.9 14.2  HCT 42.0 43.0  MCV 88.2 88.1  PLT 179 893   Basic Metabolic Panel: Recent Labs  Lab 12/05/18 0724 12/07/18 0409 12/08/18 0904 12/09/18 0518  NA 127* 126* 129* 128*  K 4.0 4.3 4.2 4.4  CL 93* 93* 91* 93*  CO2 27 23 27 27   GLUCOSE 113* 110* 138* 117*  BUN 30* 28* 27* 32*  CREATININE 0.81 0.68 0.76 0.79  CALCIUM 8.7* 8.5* 8.9 8.8*  MG 1.8  --   --   --    GFR: Estimated Creatinine Clearance: 85 mL/min (by C-G formula based on SCr of 0.79 mg/dL). Liver Function Tests: Recent Labs  Lab 12/05/18 0724  AST 14*  ALT 15  ALKPHOS 90  BILITOT 0.7  PROT 6.3*  ALBUMIN 3.3*   No results  for input(s): LIPASE, AMYLASE in the last 168 hours. No results for input(s): AMMONIA in the last 168 hours. Coagulation Profile: No results for input(s): INR, PROTIME in the last 168 hours. Cardiac Enzymes: No results for input(s): CKTOTAL, CKMB, CKMBINDEX, TROPONINI in the last 168 hours. BNP (last 3 results) No results for input(s): PROBNP in the last 8760 hours. HbA1C: No results for input(s): HGBA1C in the last 72 hours. CBG: No results for input(s): GLUCAP in the last 168 hours. Lipid Profile: No results for input(s): CHOL, HDL, LDLCALC, TRIG, CHOLHDL, LDLDIRECT in the last 72 hours.  Thyroid Function Tests: No results for input(s): TSH, T4TOTAL, FREET4, T3FREE, THYROIDAB in the last 72 hours. Anemia Panel: No results for input(s): VITAMINB12, FOLATE, FERRITIN, TIBC, IRON, RETICCTPCT in the last 72 hours. Sepsis Labs: No results for input(s): PROCALCITON, LATICACIDVEN in the last 168 hours.  No results found for this or any previous visit (from the past 240 hour(s)).       Radiology Studies: No results found.      Scheduled Meds: . apixaban  5 mg Oral BID  . atorvastatin  20 mg Oral Daily  . clonazePAM  1 mg Oral TID  . feeding supplement (ENSURE ENLIVE)  237 mL Oral Q24H  . feeding supplement (PRO-STAT SUGAR FREE 64)  30 mL Oral TID BM  . furosemide  40 mg Oral Daily  . gabapentin  100 mg Oral QHS  . ipratropium-albuterol  3 mL Nebulization BID  . lidocaine  1 patch Transdermal Q24H  . metoprolol succinate  25 mg Oral Daily  . multivitamin with minerals  1 tablet Oral Daily  . sodium chloride  1 g Oral TID WC  . spironolactone  25 mg Oral Daily  . tamsulosin  0.4 mg Oral QPM   Continuous Infusions:   LOS: 13 days     Georgette Shell, MD [facility]  If 7PM-7AM, please contact night-coverage [email or web address] Password [email or web address] 12/09/2018, 1:35 PM

## 2018-12-09 NOTE — Progress Notes (Signed)
I spoke with the patient today following his treatment. He is really struggling with the situation he is in. He is worried that he will never walk again which is a real possibility. He is hopeful the radiation will give him pain relief, and he's just not sure about what the future holds for duration of life. I suggested he take things one step at a time, hoping that a few weeks from now we will have more clarity on his clinical condition and whether he makes any meaningful improvements in his functional status and pain management. If things do not improve, than I told him I thought hospice would make sense, but if he does improve, then to consider meeting back with Dr. Benay Spice about further testing and possible treatment options if he is a candidate. I think he would benefit from a palliative's ongoing meetings for goals of care and pain management, which he is in agreement.   Carola Rhine, PAC

## 2018-12-09 NOTE — Progress Notes (Signed)
PT Cancellation Note  Patient Details Name: Benjamin [name] MRN: [id number] DOB: July 14, 1945   Cancelled Treatment:    Reason Eval/Treat Not Completed: Per chart, pt is LTC SNF resident and requires hoyer lift for OOB<>chair. Will sign off. Recommend OOB, as tolerated, with nursing using lift.   Weston Anna, PT Acute Rehabilitation Services Pager: [phone] Office: [phone]

## 2018-12-10 ENCOUNTER — Ambulatory Visit
Admit: 2018-12-10 | Discharge: 2018-12-10 | Disposition: A | Discharge status: Home / Self Care | Payer: Medicare Other | Attending: Radiation Oncology | Admitting: Radiation Oncology

## 2018-12-10 DIAGNOSIS — Z515 Encounter for palliative care: Secondary | ICD-10-CM

## 2018-12-10 LAB — BASIC METABOLIC PANEL
Anion gap: 8 (ref 5–15)
BUN: 33 mg/dL — ABNORMAL HIGH (ref 8–23)
CO2: 28 mmol/L (ref 22–32)
Calcium: 8.8 mg/dL — ABNORMAL LOW (ref 8.9–10.3)
Chloride: 91 mmol/L — ABNORMAL LOW (ref 98–111)
Creatinine, Ser: 0.86 mg/dL (ref 0.61–1.24)
GFR calc Af Amer: >60 mL/min (ref >60)
GFR calc non Af Amer: >60 mL/min (ref >60)
Glucose, Bld: 116 mg/dL — ABNORMAL HIGH (ref 70–99)
Potassium: 4.7 mmol/L (ref 3.5–5.1)
Sodium: 127 mmol/L — ABNORMAL LOW (ref 135–145)

## 2018-12-10 MED ORDER — CHLORHEXIDINE GLUCONATE CLOTH 2 % EX PADS
6.0000 | MEDICATED_PAD | Freq: Every day | CUTANEOUS | Status: DC
  Administered 2018-12-10 – 2018-12-17 (×8): 6 via TOPICAL

## 2018-12-10 MED ORDER — MIRTAZAPINE 15 MG PO TABS
15.0000 mg | ORAL_TABLET | Freq: Every day | ORAL | Status: DC
  Administered 2018-12-10: 22:00:00 15 mg via ORAL
  Filled 2018-12-10: qty 1

## 2018-12-10 MED ORDER — DIAZEPAM 5 MG PO TABS
5.0000 mg | ORAL_TABLET | Freq: Three times a day (TID) | ORAL | Status: DC
  Administered 2018-12-10 – 2018-12-11 (×4): 5 mg via ORAL
  Filled 2018-12-10 (×4): qty 1

## 2018-12-10 MED ORDER — DEXAMETHASONE 4 MG PO TABS
2.0000 mg | ORAL_TABLET | Freq: Every day | ORAL | Status: DC
  Administered 2018-12-10 – 2018-12-11 (×2): 2 mg via ORAL
  Filled 2018-12-10 (×2): qty 1

## 2018-12-10 MED ORDER — ACETAMINOPHEN 500 MG PO TABS
1000.0000 mg | ORAL_TABLET | Freq: Three times a day (TID) | ORAL | Status: DC
  Administered 2018-12-10 – 2018-12-17 (×21): 1000 mg via ORAL
  Filled 2018-12-10 (×21): qty 2

## 2018-12-10 NOTE — Progress Notes (Signed)
Nutrition Follow-up  DOCUMENTATION CODES:   Not applicable  INTERVENTION:  - continue Ensure Enlive once/day and 30 ml prostat TID. - will order Magic Cup with dinner meals, each supplement provides 290 kcal and 9 grams of protein. - continue to encourage PO intakes.    NUTRITION DIAGNOSIS:   Increased nutrient needs related to acute illness, chronic illness, cancer and cancer related treatments, wound healing as evidenced by estimated needs. -ongoing  GOAL:   Patient will meet greater than or equal to 90% of their needs -progressing  MONITOR:   PO intake, Supplement acceptance, Labs, Weight trends, Skin  ASSESSMENT:   73 y.o. male with medical history of non-small cell lung cancer and a.fib on Eliquis. He presented to the ED on 9/8 for evaluation of BLE and thoracic paralysis with sensory loss since 07/2018. He was also experiencing bladder and bowel incontinence. Patient underwent lumbar decompression surgery but did not have any improvement in symptoms. MRI (thoracic and lumbar spine) showed enhancing mass lesion in the conus medullaris and multiple small enhancing masses along the surface of the spinal cord in the upper and mid-thoracic spine. Findings felt to be compatible with metastatic lung cancer. MRI brain showed no mets. He was admitted with plan for 10 days of palliative radiation.  Per flow sheet, patient consumed 75% of lunch and 100% of dinner on 9/19 (total of 894 kcal, 43 grams protein); 25% of breakfast and lunch and 50% of dinner on 9/20 (total of 541 kcal, 29 grams protein); 100% of all meals on 9/21 (total of 1526 kcal, 82 grams protein).  Per review of orders, patient has been accepting Ensure 50-75% of the time offered and prostat ~90% of the time offered.   Provided active listening to patient as he enjoyed talking about experiences he has had in his life, his recent purchase of a new truck, etc. Patient reports decreased appetite d/t ongoing severe pain and  "having a lot on my mind." He does not feel that radiation has improved pain, but states he was informed that it may take more time to notice an effect.   He would like a bowl of raisin bran each morning and would prefer not to receive any more carrots; entered preferences into [facility]. Reminded patient of snacks and drinks on the floor and also encouraged him to order items on meal trays that he can save for between meal times. He denied any nutrition-related questions or other concerns at this time.   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  Medications reviewed; 40 mg oral lasix/day, daily multivitamin with minerals, 1 g NaCl TID, 25 mg aldactone/day.      NUTRITION - FOCUSED PHYSICAL EXAM:  completed to upper body only; no muscle and no fat wasting.   Diet Order:   Diet Order            Diet Heart Room service appropriate? Yes; Fluid consistency: Thin; Fluid restriction: 1200 mL Fluid  Diet effective now              EDUCATION NEEDS:   No education needs have been identified at this time  Skin:  Skin Assessment: Skin  Integrity Issues: Skin Integrity Issues:: Stage II Stage II: R buttocks and sacrum  Last BM:  9/19  Height:   Ht Readings from Last 1 Encounters:  12/03/18 5\' 11"  (1.803 m)    Weight:   Wt Readings from Last 1 Encounters:  12/03/18 83.8 kg    Ideal Body Weight:  78.2 kg  BMI:  Body mass index is 25.77 kg/m.  Estimated Nutritional Needs:   Kcal:  2300-2500 kcal  Protein:  115-125 grams  Fluid:  >/= 2.3 L/day      Jarome Matin, MS, RD, LDN, [facility] Inpatient  Clinical Dietitian Pager # [phone] After hours/weekend pager # [phone]

## 2018-12-10 NOTE — Progress Notes (Signed)
PROGRESS NOTE    Benjamin [name]  VQQ:[id number] DOB: 12-30-1945 DOA: 11/26/2018 PCP: Hennie Duos, MD   Brief Narrative: 73 y.o.malewith PMH of spinal stenosis,mealnoma,non-small cell lung cancer and paroxysmal atrial fibrillation on Eliquis at home presented to the ED on9/8/2020with progressive weakness in the lower extremities since last May.Patient had severe spondylitic stenosis at L4-L5. About 4 months ago he underwent surgical decompression of the severe stenosis and he was sent off to rehab. Despite efforts at rehab his legs became progressively weaker with sensory changes as well as bowel and bladder incontinence.Patient was evaluated by NS, a myelogram was performed on 9/8, as the patient had become near-complete paraplegic, which revealed high-grade block at the thoracolumbar junction.Patient has history of lung cancer, he had radiation therapies and treatment at least 6 times for the cancer since 2003. MRI thoracic and lumbar spine showedenhancing mass lesion in the conus medullaris. Multiple small enhancing masses along the surface of the cord in the upper and midthoracic spine. 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 MRI brain showed no metastases to the brain or cerebral meninges.Patient on the direction of Dr Ellene Route, admitted to medical service with neurology,neurosurgeryand radiation oncology consultations and planned for 10 days of palliative radiation inpatient treatments due to difficulty with transportation.Oncology has been having discussions with patient about further plans/prognosis.Palliative care on board for discussion about goals of care. Patient had a family meeting with palliative care.   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 Spinal mass with paraplegia/incontinence: Per Oncology, spinal mass is likely related to prior lung cancer but could be related to his prior history of melanomaversus another malignancy. Receiving palliative RT (s/p 5/10 Rxs). Oncology discussed with patient regarding poor prognosis, low likelihood of restoring bladder function and lower extremity weakness etc. They discussed option of comfort care/hospice versus diagnostic LP or biopsy of spinal mass to confirm tumor diagnosis /consider systemic treatment options.He is unsure of how he wishes to proceed and in discussions with palliative care team. Awaiting SNF.  Palliative care has started him on OxyIR in addition to Klonopin gabapentin and lidocaine patch.  He appears to be comfortable today.  Patient staying to finish the course of XRT prior to discharge as he has no way of transportation and the difficulties with transportation.  2.Neurogenic bladder: On indwelling Foley catheter-Exchanged.  3.Hyponatremia. Sodium today 128.  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  4.Chronic diastolic heart failure:Compensated at this time. Continue Lasix and Aldactone.  5.History of complete heart block status post pacemaker-Stable.  6.History of paroxysmal atrial fibrillation-Continue metoprolol and Eliquis.  Staff reported patient had ventricular trigeminy.  Check magnesium.  Potassium 4.4.  7.Benign prostatic hypertrophy-Continue Flomax. On indwelling Foley for #2  8. Sacral and buttocks stage II pressure ulcer present on admission. Continue wound care and prevention protocol.  9. Back pain :Patient prefers to avoid opiates as make him too sleepy. Will add lidocaine patch to see if helps.   DVT prophylaxis:On Eliquis Code Status:Full code Family / Patient Communication: None Disposition  Plan:awaiting SNF Consults palliative care and oncology  Pressure Injury 12/03/18 Buttocks Right Stage II -  Partial  thickness loss of dermis presenting as a shallow open ulcer with a red, pink wound bed without slough. reddened circular pressure ulcer (Active)  12/03/18 1505  Location: Buttocks  Location Orientation: Right  Staging: Stage II -  Partial thickness loss of dermis presenting as a shallow open ulcer with a red, pink wound bed without slough.  Wound Description (Comments): reddened circular pressure ulcer  Present on Admission: Yes     Pressure Injury 12/03/18 Sacrum Stage II -  Partial thickness loss of dermis presenting as a shallow open ulcer with a red, pink wound bed without slough. (Active)  12/03/18 1505  Location: Sacrum  Location Orientation:   Staging: Stage II -  Partial thickness loss of dermis presenting as a shallow open ulcer with a red, pink wound bed without slough.  Wound Description (Comments):   Present on Admission: Yes      Nutrition Problem: Increased nutrient needs Etiology: acute illness, chronic illness, cancer and cancer related treatments, wound healing     Signs/Symptoms: estimated needs    Interventions: Ensure Enlive (each supplement provides 350kcal and 20 grams of protein), Magic cup, MVI, Prostat  Estimated body mass index is 25.77 kg/m as calculated from the following:   Height as of this encounter: 5\' 11"  (1.803 m).   Weight as of this encounter: 83.8 kg.   Subjective: He is resting in bed always have pain in the sacral area denies any nausea vomiting  Objective: Vitals:   12/09/18 2126 12/10/18 0528 12/10/18 0841 12/10/18 1004  BP: 116/69 108/77 121/86   Pulse: 60 60 60   Resp: 18 16    Temp: 97.9 F (36.6 C) 97.7 F (36.5 C)    TempSrc: Oral Oral    SpO2: 96% 95%  96%  Weight:      Height:        Intake/Output Summary (Last 24 hours) at 12/10/2018 1242 Last data filed at 12/10/2018 1212 Gross per 24 hour   Intake 360 ml  Output 1975 ml  Net -1615 ml   Filed Weights   12/03/18 1451  Weight: 83.8 kg    Examination:  General exam: Appears calm and comfortable  Respiratory system: Clear to auscultation. Respiratory effort normal. Cardiovascular system: S1 & S2 heard, RRR. No JVD, murmurs, rubs, gallops or clicks. No pedal edema. Gastrointestinal system: Abdomen is nondistended, soft and nontender. No organomegaly or masses felt. Normal bowel sounds heard. Central nervous system: Alert and oriented. No focal neurological deficits. Extremities: 0 x 5 power in the lower extremity  Skin: No rashes, lesions or ulcers Psychiatry: Judgement and insight appear normal. Mood & affect appropriate.     Data Reviewed: I have personally reviewed following labs and imaging studies  CBC: Recent Labs  Lab 12/05/18 0724 12/07/18 0409  WBC 7.9 7.8  HGB 13.9 14.2  HCT 42.0 43.0  MCV 88.2 88.1  PLT 179 409   Basic Metabolic Panel: Recent Labs  Lab 12/05/18 0724 12/07/18 0409 12/08/18 0904 12/09/18 0518 12/10/18 0444  NA 127* 126* 129* 128* 127*  K 4.0 4.3 4.2 4.4 4.7  CL 93* 93* 91* 93* 91*  CO2 27 23 27 27 28   GLUCOSE 113* 110* 138* 117* 116*  BUN 30* 28* 27* 32* 33*  CREATININE 0.81 0.68 0.76 0.79 0.86  CALCIUM 8.7* 8.5* 8.9 8.8* 8.8*  MG 1.8  --   --  1.9  --    GFR: Estimated Creatinine Clearance: 79 mL/min (by C-G formula based on SCr of 0.86  mg/dL). Liver Function Tests: Recent Labs  Lab 12/05/18 0724  AST 14*  ALT 15  ALKPHOS 90  BILITOT 0.7  PROT 6.3*  ALBUMIN 3.3*   No results for input(s): LIPASE, AMYLASE in the last 168 hours. No results for input(s): AMMONIA in the last 168 hours. Coagulation Profile: No results for input(s): INR, PROTIME in the last 168 hours. Cardiac Enzymes: No results for input(s): CKTOTAL, CKMB, CKMBINDEX, TROPONINI in the last 168 hours. BNP (last 3 results) No results for input(s): PROBNP in the last 8760 hours. HbA1C: No results  for input(s): HGBA1C in the last 72 hours. CBG: No results for input(s): GLUCAP in the last 168 hours. Lipid Profile: No results for input(s): CHOL, HDL, LDLCALC, TRIG, CHOLHDL, LDLDIRECT in the last 72 hours. Thyroid Function Tests: No results for input(s): TSH, T4TOTAL, FREET4, T3FREE, THYROIDAB in the last 72 hours. Anemia Panel: No results for input(s): VITAMINB12, FOLATE, FERRITIN, TIBC, IRON, RETICCTPCT in the last 72 hours. Sepsis Labs: No results for input(s): PROCALCITON, LATICACIDVEN in the last 168 hours.  No results found for this or any previous visit (from the past 240 hour(s)).       Radiology Studies: No results found.      Scheduled Meds: . apixaban  5 mg Oral BID  . atorvastatin  20 mg Oral Daily  . Chlorhexidine Gluconate Cloth  6 each Topical Daily  . clonazePAM  1 mg Oral TID  . feeding supplement (ENSURE ENLIVE)  237 mL Oral Q24H  . feeding supplement (PRO-STAT SUGAR FREE 64)  30 mL Oral TID BM  . furosemide  40 mg Oral Daily  . gabapentin  100 mg Oral QHS  . ipratropium-albuterol  3 mL Nebulization BID  . lidocaine  1 patch Transdermal Q24H  . metoprolol succinate  25 mg Oral Daily  . multivitamin with minerals  1 tablet Oral Daily  . sodium chloride  1 g Oral TID WC  . spironolactone  25 mg Oral Daily  . tamsulosin  0.4 mg Oral QPM   Continuous Infusions:   LOS: 14 days     Georgette Shell, MD [facility]  If 7PM-7AM, please contact night-coverage [email or web address] Password [facility] 12/10/2018, 12:42 PM

## 2018-12-10 NOTE — Progress Notes (Signed)
Palliative Care Follow-up RE: Symptom Management  73 yo with recurrent metastatic lung cancer with large lumbar spinal mass causing pain and symptomatic cord compression. He is currently residing at [facility] in LTC status under Medicaid because he is unable to rehab due to pain, compression and lower extremity muscle atrophy-likely irreversible paraplegia. He is currently receiving radiation 6/10 to the spinal mass for pain and symptom control.  His other medical problems include A. 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  Assessment: I met with [name] today in follow-up for symptom management and support.  Psychosocial: He shares with me that his only real family is his brother who is a great support, but is dealing with the recent loss of his wife from cancer. He isn't happy about being at [facility] but accepts that he has no other choice and needs this level of care. He recalls his mother died at [facility] several years ago and he used to go visit her there everyday so this is particulary hard on him. He shared with me about his cancer journey.  He has a brand new truck that is sitting in his brothers driveway and he often thinks about what it would be like to drive it and to be able to walk again. He used to drive every morning to [employer] to get coffee and oatmeal and occasionally a Sausage biscuit. He misses his life. He talks about the long days when he is in the bed and his mind races about all of the things he needs to do or wants to do but his condition prevents him from doing them. He describes himself as an anxious person in general and the loss of independence has been devastating. He is unsure of his prognosis but he knows he is dying -but also fears not getting pain control or his function back. He would benefit from Chaplain visits and ongoing emotional/spiritual  support.  Pain and Symptom Management:  1. 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 Movement is most painful for him. He is hopeful radiation will help him. In the meantime I have reviewed his imaging and current pain regimen. He has been on benzodiazapines for 20+ years daily for anxiety-he believes he has tolerance to them now.  He has had difficulty tolerating several pain regimens due to feeling overly sedated and severe fatigue, steroids have made him jittery and anxious/irritable and adjuvants like neurontin and lidoderm have been in lower doses unhelpful for his pain.  I discussed with him the very low doses of both opioid and adjuvants for neuropathic pain that he is on currently- I suspect some of his fatigue and generalized weakness is not attributed to his pain medications- but multifactorial from his cancer, radiation, poor PO intake, depression etc.   We have agreed on a trial of the following plan:  1. Decadron 65m PO daily for inflammation, fatigue and appetite. If he tolerates the 243mdose may consider increasing it for better control.  2. Switch klonopin to schedule diazapam for muscle spasms and anxiety- and better effect since he may have tolerance from being on klonopin for so many years.  3. Start Mirtazapine 1545mhs for depression, appetite and neuropathic pain.  4. Stop the small amount of gabapentin  at night-doubt it is adding benefit.  5. Continue Oxycodone, encouraged PRN use  Other Palliative Needs: He is on a low sodium heart healthy diet, but is receiving sodium tablets and says the food has not been desirable. Given his current situation, need for nutrition and appetite stimulation I have switched him to a regular diet.  ACP: I believe after meeting him today that a frank  discussion about code status would be important and well received-he is a logical and reasonable man who is grieving the loss of his function and dealing with pain. Will be critical to address this before he is discharged from the hospital and complete a MOST form in the Epic storyboard ACP link.  Disposition: After completion of his radiation he would be very appropriate for hospice care-we discussed hospice vs. Palliative and how we could support his care at [facility] and advocate for his needs. I have started this conversation and he is open to hearing more about hospice services.  Lane Hacker, DO Palliative Medicine [phone]

## 2018-12-11 ENCOUNTER — Ambulatory Visit
Admit: 2018-12-11 | Discharge: 2018-12-11 | Disposition: A | Discharge status: Home / Self Care | Payer: Medicare Other | Attending: Radiation Oncology | Admitting: Radiation Oncology

## 2018-12-11 LAB — BASIC METABOLIC PANEL
Anion gap: 9 (ref 5–15)
BUN: 34 mg/dL — ABNORMAL HIGH (ref 8–23)
CO2: 24 mmol/L (ref 22–32)
Calcium: 9 mg/dL (ref 8.9–10.3)
Chloride: 96 mmol/L — ABNORMAL LOW (ref 98–111)
Creatinine, Ser: 0.81 mg/dL (ref 0.61–1.24)
GFR calc Af Amer: >60 mL/min (ref >60)
GFR calc non Af Amer: >60 mL/min (ref >60)
Glucose, Bld: 133 mg/dL — ABNORMAL HIGH (ref 70–99)
Potassium: 4.8 mmol/L (ref 3.5–5.1)
Sodium: 129 mmol/L — ABNORMAL LOW (ref 135–145)

## 2018-12-11 MED ORDER — CLONAZEPAM 1 MG PO TABS
1.0000 mg | ORAL_TABLET | Freq: Three times a day (TID) | ORAL | Status: DC
  Administered 2018-12-11 – 2018-12-17 (×17): 1 mg via ORAL
  Filled 2018-12-11: qty 1
  Filled 2018-12-11: qty 2
  Filled 2018-12-11 (×13): qty 1
  Filled 2018-12-11: qty 2
  Filled 2018-12-11: qty 1

## 2018-12-11 MED ORDER — DOCUSATE SODIUM 100 MG PO CAPS
200.0000 mg | ORAL_CAPSULE | Freq: Two times a day (BID) | ORAL | Status: DC
  Administered 2018-12-11: 200 mg via ORAL
  Filled 2018-12-11: qty 2

## 2018-12-11 MED ORDER — POLYETHYLENE GLYCOL 3350 17 G PO PACK
17.0000 g | PACK | Freq: Every day | ORAL | Status: DC
  Administered 2018-12-11 – 2018-12-16 (×6): 17 g via ORAL
  Filled 2018-12-11 (×7): qty 1

## 2018-12-11 NOTE — Progress Notes (Signed)
PROGRESS NOTE    Benjamin [name]  POE:[id number] DOB: 1945/12/08 DOA: 11/26/2018 PCP: Hennie Duos, MD   Brief Narrative:73 y.o.malewith PMH of spinal stenosis,mealnoma,non-small cell lung cancer and paroxysmal atrial fibrillation on Eliquis at home presented to the ED on9/8/2020with progressive weakness in the lower extremities since last May.Patient had severe spondylitic stenosis at L4-L5. About 4 months ago he underwent surgical decompression of the severe stenosis and he was sent off to rehab. Despite efforts at rehab his legs became progressively weaker with sensory changes as well as bowel and bladder incontinence.Patient was evaluated by NS, a myelogram was performed on 9/8, as the patient had become near-complete paraplegic, which revealed high-grade block at the thoracolumbar junction.Patient has history of lung cancer, he had radiation therapies and treatment at least 6 times for the cancer since 2003. MRI thoracic and lumbar spine showedenhancing mass lesion in the conus medullaris. Multiple small enhancing masses along the surface of the cord in the upper and midthoracic spine. 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 MRI brain showed no metastases to the brain or cerebral meninges.Patient on the direction of Dr Ellene Route, admitted to medical service with neurology,neurosurgeryand radiation oncology consultations and planned for 10 days of palliative radiation inpatient treatments due to difficulty with transportation.Oncology has been having discussions with patient about further plans/prognosis.Palliative care on board for discussion about goals of care. Patient had a family meeting with palliative care.   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 Spinal mass with paraplegia/incontinence: Per Oncology, spinal mass is likely related to prior lung cancer but could be related to his prior history of melanomaversus another malignancy. Receiving palliative RT (s/p5/10 Rxs). Oncology discussed with patient regarding poor prognosis, low likelihood of restoring bladder function and lower extremity weakness etc. They discussed option of comfort care/hospice versus diagnostic LP or biopsy of spinal mass to confirm tumor diagnosis /consider systemic treatment options. Palliative care notes reviewed.  Patient staying to finish the course of XRT prior to discharge as he has no way of transportation and the difficulties with transportation.  2.Neurogenic bladder: On indwelling Foley catheter-Exchanged.  3.Hyponatremia. 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  4.Chronic diastolic heart failure:Compensated at this time. Continue Lasix and Aldactone.  5.History of complete heart block status post pacemaker-Stable.  6.History of paroxysmal atrial fibrillation-Continue metoprolol and Eliquis.Staff reported patient had ventricular trigeminy. Check magnesium. Potassium 4.4.  7.Benign prostatic hypertrophy-Continue Flomax. On indwelling Foley for #2  8. Sacral and buttocks stage II pressure ulcer present on admission. Continue wound care and prevention protocol.  9. Back pain / neuropathic pain: Patient started on Decadron 2 mg daily for appetite fatigue and inflammation, scheduled dose of diazepam, Remeron 15 mg nightly and continue oxycodone and gabapentin DC'd per palliative care.   10 constipation add stool softeners. DVT prophylaxis:On Eliquis Code Status:Full code Family / Patient Communication:None Disposition Plan:awaiting SNF Consults palliative care and oncology  Patient still with significant pain pain medications being adjusted for comfort.  Pressure Injury 12/03/18 Buttocks Right Stage II -  Partial thickness loss of dermis presenting as a shallow open ulcer with a red, pink wound  bed without slough. reddened circular pressure ulcer (Active)  12/03/18 1505  Location: Buttocks  Location Orientation: Right  Staging: Stage II -  Partial thickness loss of dermis presenting as a shallow open ulcer with a red, pink wound bed without slough.  Wound Description (Comments): reddened circular pressure ulcer  Present on Admission: Yes     Pressure Injury 12/03/18 Sacrum Stage II -  Partial thickness loss of dermis presenting as a shallow open ulcer with a red, pink wound bed without slough. (Active)  12/03/18 1505  Location: Sacrum  Location Orientation:   Staging: Stage II -  Partial thickness loss of dermis presenting as a shallow open ulcer with a red, pink wound bed without slough.  Wound Description (Comments):   Present on Admission: Yes      Nutrition Problem: Increased nutrient needs Etiology: acute illness, chronic illness, cancer and cancer related treatments, wound healing     Signs/Symptoms: estimated needs    Interventions: Ensure Enlive (each supplement provides 350kcal and 20 grams of protein), Magic cup, MVI, Prostat  Estimated body mass index is 25.77 kg/m as calculated from the following:   Height as of this encounter: 5\' 11"  (1.803 m).   Weight as of this encounter: 83.8 kg.   Subjective:  In bed reports no bowel movement for many days.  Has not had much to eat. Objective: Vitals:   12/10/18 1924 12/10/18 2034 12/11/18 0440 12/11/18 1239  BP:  109/66 (!) 107/55 127/74  Pulse:  62 (!) 59 (!) 59  Resp:  16 18 15   Temp:  98.1 F (36.7 C) 98 F (36.7 C) (!) 97.4 F (36.3 C)  TempSrc:  Oral Oral Oral  SpO2: 95% 98% 94% 98%  Weight:      Height:        Intake/Output Summary (Last 24 hours) at 12/11/2018 1353  Last data filed at 12/11/2018 1000 Gross per 24 hour  Intake -  Output 1525 ml  Net -1525 ml   Filed Weights   12/03/18 1451  Weight: 83.8 kg    Examination:  General exam: Appears calm and comfortable  Respiratory system: Clear to auscultation. Respiratory effort normal. Cardiovascular system: S1 & S2 heard, RRR. No JVD, murmurs, rubs, gallops or clicks. No pedal edema. Gastrointestinal system: Abdomen is nondistended, soft and nontender. No organomegaly or masses felt. Normal bowel sounds heard. Central nervous system: Alert and oriented. No focal neurological deficits. Extremities: Paraplegic Skin: No rashes, lesions or ulcers Psychiatry: Judgement and insight appear normal. Mood & affect appropriate.     Data Reviewed: I have personally reviewed following labs and imaging studies  CBC: Recent Labs  Lab 12/05/18 0724 12/07/18 0409  WBC 7.9 7.8  HGB 13.9 14.2  HCT 42.0 43.0  MCV 88.2 88.1  PLT 179 175   Basic Metabolic Panel: Recent Labs  Lab 12/05/18 0724 12/07/18 0409 12/08/18 0904 12/09/18 0518 12/10/18 0444 12/11/18 0448  NA 127* 126* 129* 128* 127* 129*  K 4.0 4.3 4.2 4.4 4.7 4.8  CL 93* 93* 91* 93* 91* 96*  CO2 27 23 27 27 28 24   GLUCOSE 113* 110* 138* 117* 116* 133*  BUN 30* 28* 27* 32* 33* 34*  CREATININE 0.81 0.68 0.76 0.79 0.86 0.81  CALCIUM 8.7* 8.5* 8.9 8.8* 8.8* 9.0  MG 1.8  --   --  1.9  --   --    GFR: Estimated Creatinine Clearance: 83.9 mL/min (by C-G formula based on SCr of 0.81 mg/dL). Liver Function Tests:  Recent Labs  Lab 12/05/18 0724  AST 14*  ALT 15  ALKPHOS 90  BILITOT 0.7  PROT 6.3*  ALBUMIN 3.3*   No results for input(s): LIPASE, AMYLASE in the last 168 hours. No results for input(s): AMMONIA in the last 168 hours. Coagulation Profile: No results for input(s): INR, PROTIME in the last 168 hours. Cardiac Enzymes: No results for input(s): CKTOTAL, CKMB, CKMBINDEX, TROPONINI in the last 168 hours. BNP (last 3  results) No results for input(s): PROBNP in the last 8760 hours. HbA1C: No results for input(s): HGBA1C in the last 72 hours. CBG: No results for input(s): GLUCAP in the last 168 hours. Lipid Profile: No results for input(s): CHOL, HDL, LDLCALC, TRIG, CHOLHDL, LDLDIRECT in the last 72 hours. Thyroid Function Tests: No results for input(s): TSH, T4TOTAL, FREET4, T3FREE, THYROIDAB in the last 72 hours. Anemia Panel: No results for input(s): VITAMINB12, FOLATE, FERRITIN, TIBC, IRON, RETICCTPCT in the last 72 hours. Sepsis Labs: No results for input(s): PROCALCITON, LATICACIDVEN in the last 168 hours.  No results found for this or any previous visit (from the past 240 hour(s)).       Radiology Studies: No results found.      Scheduled Meds: . acetaminophen  1,000 mg Oral TID  . apixaban  5 mg Oral BID  . atorvastatin  20 mg Oral Daily  . Chlorhexidine Gluconate Cloth  6 each Topical Daily  . dexamethasone  2 mg Oral Daily  . diazepam  5 mg Oral TID  . docusate sodium  200 mg Oral BID  . feeding supplement (ENSURE ENLIVE)  237 mL Oral Q24H  . feeding supplement (PRO-STAT SUGAR FREE 64)  30 mL Oral TID BM  . furosemide  40 mg Oral Daily  . ipratropium-albuterol  3 mL Nebulization BID  . lidocaine  1 patch Transdermal Q24H  . metoprolol succinate  25 mg Oral Daily  . mirtazapine  15 mg Oral QHS  . polyethylene glycol  17 g Oral Daily  . sodium chloride  1 g Oral TID WC  . spironolactone  25 mg Oral Daily  . tamsulosin  0.4 mg Oral QPM   Continuous Infusions:   LOS: 15 days     Georgette Shell, MD [facility]  If 7PM-7AM, please contact night-coverage [email or web address] Password [email or web address] 12/11/2018, 1:53 PM

## 2018-12-12 ENCOUNTER — Ambulatory Visit
Admit: 2018-12-12 | Discharge: 2018-12-12 | Disposition: A | Discharge status: Home / Self Care | Payer: Medicare Other | Attending: Radiation Oncology | Admitting: Radiation Oncology

## 2018-12-12 DIAGNOSIS — G8220 Paraplegia, unspecified: Secondary | ICD-10-CM

## 2018-12-12 LAB — BASIC METABOLIC PANEL
Anion gap: 7 (ref 5–15)
BUN: 31 mg/dL — ABNORMAL HIGH (ref 8–23)
CO2: 24 mmol/L (ref 22–32)
Calcium: 8.6 mg/dL — ABNORMAL LOW (ref 8.9–10.3)
Chloride: 97 mmol/L — ABNORMAL LOW (ref 98–111)
Creatinine, Ser: 0.71 mg/dL (ref 0.61–1.24)
GFR calc Af Amer: >60 mL/min (ref >60)
GFR calc non Af Amer: >60 mL/min (ref >60)
Glucose, Bld: 116 mg/dL — ABNORMAL HIGH (ref 70–99)
Potassium: 4.4 mmol/L (ref 3.5–5.1)
Sodium: 128 mmol/L — ABNORMAL LOW (ref 135–145)

## 2018-12-12 MED ORDER — FENTANYL CITRATE (PF) 100 MCG/2ML IJ SOLN
12.5000 ug | INTRAMUSCULAR | Status: DC | PRN
  Administered 2018-12-12: 12.5 ug via INTRAVENOUS
  Filled 2018-12-12: qty 2

## 2018-12-12 MED ORDER — GABAPENTIN 100 MG PO CAPS
100.0000 mg | ORAL_CAPSULE | Freq: Two times a day (BID) | ORAL | Status: DC
  Administered 2018-12-12 – 2018-12-17 (×11): 100 mg via ORAL
  Filled 2018-12-12 (×11): qty 1

## 2018-12-12 NOTE — Progress Notes (Signed)
PROGRESS NOTE    Benjamin [name]  MGQ:[id number] DOB: 1945-04-28 DOA: 11/26/2018 PCP: Hennie Duos, MD  Brief Narrative: 73 y.o.malewith PMH of spinal stenosis,mealnoma,non-small cell lung cancer and paroxysmal atrial fibrillation on Eliquis at home presented to the ED on9/8/2020with progressive weakness in the lower extremities since last May.Patient had severe spondylitic stenosis at L4-L5. About 4 months ago he underwent surgical decompression of the severe stenosis and he was sent off to rehab. Despite efforts at rehab his legs became progressively weaker with sensory changes as well as bowel and bladder incontinence.Patient was evaluated by NS, a myelogram was performed on 9/8, as the patient had become near-complete paraplegic, which revealed high-grade block at the thoracolumbar junction.Patient has history of lung cancer, he had radiation therapies and treatment at least 6 times for the cancer since 2003. MRI thoracic and lumbar spine showedenhancing mass lesion in the conus medullaris. Multiple small enhancing masses along the surface of the cord in the upper and midthoracic spine. 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 MRI brain showed no metastases to the brain or cerebral meninges.Patient on the direction of Dr Ellene Route, admitted to medical service with neurology,neurosurgeryand radiation oncology consultations and planned for 10 days of palliative radiation inpatient treatments due to difficulty with transportation.Oncology has been having discussions with patient about further plans/prognosis.Palliative care on board for discussion about goals of care. Patient had a family meeting with palliative care.   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 Spinal mass with paraplegia/incontinence: Per Oncology, spinal mass is likely related to prior lung cancer but could be related to his prior history of melanomaversus another malignancy. Receiving palliative RT (s/p5/10 Rxs). Oncology discussed with patient regarding poor prognosis, low likelihood of restoring bladder function and lower extremity weakness etc. They discussed option of comfort care/hospice versus diagnostic LP or biopsy of spinal mass to confirm tumor diagnosis /consider systemic treatment options. Palliative care notes reviewed.Patient staying to finish the course of XRT prior to discharge as he has no way of transportation and the difficulties with transportation.  2.Neurogenic bladder: On indwelling Foley catheter-Exchanged.  3.Hyponatremia. 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  4.Chronic diastolic heart failure:Compensated at this time. Continue Lasix and Aldactone.  5.History of complete heart block status post pacemaker-Stable.  6.History of paroxysmal atrial fibrillation-Continue metoprolol and Eliquis.Staff reported patient had ventricular trigeminy. Check magnesium. Potassium 4.4.  7.Benign prostatic hypertrophy-Continue Flomax. On indwelling Foley for #2  8. Sacral and buttocks stage II pressure ulcer present on admission. Continue wound care and prevention protocol. 9. Back pain / neuropathic pain:  Decadron diazepam has been stopped restarted Klonopin and gabapentin.  Continue Remeron.   10 constipation add stool softeners. DVT prophylaxis:On Eliquis Code Status:Full code will discuss CODE STATUS prior to discharge Family / Patient Communication:None Disposition Plan:awaiting SNF Consults palliative care and oncology Patient still with significant pain pain medications being  adjusted for comfort.     Pressure Injury 12/03/18 Buttocks Right Stage II -  Partial thickness loss of dermis presenting as a shallow open ulcer with a red, pink wound bed without slough. reddened circular pressure ulcer (Active)  12/03/18 1505  Location: Buttocks  Location Orientation: Right  Staging: Stage II -  Partial thickness loss of dermis presenting as a shallow open ulcer with a red, pink wound bed without slough.  Wound Description (Comments): reddened circular pressure ulcer  Present on Admission: Yes     Pressure Injury 12/03/18 Sacrum Stage II -  Partial thickness loss of dermis presenting as a shallow open ulcer with a red, pink wound bed without slough. (Active)  12/03/18 1505  Location: Sacrum  Location Orientation:   Staging: Stage II -  Partial thickness loss of dermis presenting as a shallow open ulcer with a red, pink wound bed without slough.  Wound Description (Comments):   Present on Admission: Yes      Nutrition Problem: Increased nutrient needs Etiology: acute illness, chronic illness, cancer and cancer related treatments, wound healing     Signs/Symptoms: estimated needs    Interventions: Ensure Enlive (each supplement provides 350kcal and 20 grams of protein), Magic cup, MVI, Prostat  Estimated body mass index is 25.77 kg/m as calculated from the following:   Height as of this encounter: 5\' 11"  (1.803 m).   Weight as of this encounter: 83.8 kg.  Subjective: Patient reports feeling drugged out and wanting Klonopin back and gabapentin back.  He reports a lot of low back pain.  He is requesting gabapentin prior to XRT today.  Objective: Vitals:   12/11/18 2055 12/11/18 2108 12/12/18 0505 12/12/18 0804  BP:  114/65 (!) 99/52   Pulse:  (!) 59 62   Resp:  16 16   Temp:  97.9 F (36.6 C) 97.6 F (36.4 C)   TempSrc:  Oral Oral   SpO2: 98% 95% 95% 97%  Weight:      Height:        Intake/Output Summary (Last 24 hours) at 12/12/2018 1148  Last data filed at 12/12/2018 0600 Gross per 24 hour  Intake 860 ml  Output 650 ml  Net 210 ml   Filed Weights   12/03/18 1451  Weight: 83.8 kg    Examination:  General exam: Appears in mild discomfort due to back pain Respiratory system: Clear to auscultation. Respiratory effort normal. Cardiovascular system: S1 & S2 heard, RRR. No JVD, murmurs, rubs, gallops or clicks. No pedal edema. Gastrointestinal system: Abdomen is nondistended, soft and nontender. No organomegaly or masses felt. Normal bowel sounds heard. Central nervous system: Alert and oriented. No focal neurological deficits. Extremities no movement at all with no sensation below the knees bilaterally Skin: No rashes, lesions or ulcers Psychiatry: Somewhat depressed about the situation as he wanted to live a little bit more longer   Data Reviewed: I have personally reviewed following labs and imaging studies  CBC: Recent Labs  Lab 12/07/18 0409  WBC 7.8  HGB 14.2  HCT 43.0  MCV 88.1  PLT 532   Basic Metabolic Panel: Recent Labs  Lab 12/08/18 0904 12/09/18 0518 12/10/18 0444 12/11/18 0448 12/12/18 0512  NA 129* 128* 127* 129* 128*  K 4.2 4.4 4.7 4.8 4.4  CL 91* 93* 91* 96* 97*  CO2 27 27 28 24 24   GLUCOSE 138* 117* 116* 133* 116*  BUN 27* 32* 33* 34* 31*  CREATININE 0.76 0.79 0.86 0.81 0.71  CALCIUM 8.9 8.8* 8.8* 9.0 8.6*  MG  --  1.9  --   --   --    GFR: Estimated Creatinine Clearance: 85 mL/min (by C-G formula based on SCr of 0.71 mg/dL). Liver Function Tests: No  results for input(s): AST, ALT, ALKPHOS, BILITOT, PROT, ALBUMIN in the last 168 hours. No results for input(s): LIPASE, AMYLASE in the last 168 hours. No results for input(s): AMMONIA in the last 168 hours. Coagulation Profile: No results for input(s): INR, PROTIME in the last 168 hours. Cardiac Enzymes: No results for input(s): CKTOTAL, CKMB, CKMBINDEX, TROPONINI in the last 168 hours. BNP (last 3 results) No results for  input(s): PROBNP in the last 8760 hours. HbA1C: No results for input(s): HGBA1C in the last 72 hours. CBG: No results for input(s): GLUCAP in the last 168 hours. Lipid Profile: No results for input(s): CHOL, HDL, LDLCALC, TRIG, CHOLHDL, LDLDIRECT in the last 72 hours. Thyroid Function Tests: No results for input(s): TSH, T4TOTAL, FREET4, T3FREE, THYROIDAB in the last 72 hours. Anemia Panel: No results for input(s): VITAMINB12, FOLATE, FERRITIN, TIBC, IRON, RETICCTPCT in the last 72 hours. Sepsis Labs: No results for input(s): PROCALCITON, LATICACIDVEN in the last 168 hours.  No results found for this or any previous visit (from the past 240 hour(s)).       Radiology Studies: No results found.      Scheduled Meds: . acetaminophen  1,000 mg Oral TID  . apixaban  5 mg Oral BID  . atorvastatin  20 mg Oral Daily  . Chlorhexidine Gluconate Cloth  6 each Topical Daily  . clonazePAM  1 mg Oral TID  . feeding supplement (ENSURE ENLIVE)  237 mL Oral Q24H  . feeding supplement (PRO-STAT SUGAR FREE 64)  30 mL Oral TID BM  . furosemide  40 mg Oral Daily  . gabapentin  100 mg Oral BID  . ipratropium-albuterol  3 mL Nebulization BID  . lidocaine  1 patch Transdermal Q24H  . metoprolol succinate  25 mg Oral Daily  . polyethylene glycol  17 g Oral Daily  . sodium chloride  1 g Oral TID WC  . spironolactone  25 mg Oral Daily  . tamsulosin  0.4 mg Oral QPM   Continuous Infusions:   LOS: 16 days     Georgette Shell, MD [facility]  If 7PM-7AM, please contact night-coverage [email or web address] Password [facility] 12/12/2018, 11:48 AM

## 2018-12-12 NOTE — Progress Notes (Signed)
HEMATOLOGY-ONCOLOGY PROGRESS NOTE  SUBJECTIVE: Benjamin. Benjamin [name] continues daily radiation.  He reports persistent back pain that is worse with movement.  He has continued numbness in the legs.  PHYSICAL EXAMINATION:  Vitals:   12/12/18 0505 12/12/18 0804  BP: (!) 99/52   Pulse: 62   Resp: 16   Temp: 97.6 F (36.4 C)   SpO2: 95% 97%   Filed Weights   12/03/18 1451  Weight: 184 lb 11.9 oz (83.8 kg)    Intake/Output from previous day: 09/23 0701 - 09/24 0700 In: 980 [P.O.:980] Out: 1000 [Urine:1000]  Abdomen: Soft and nontender Neurologic: Numbness from the left knee distally from the right mid thigh distally  LABORATORY DATA:  I have reviewed the data as listed CMP Latest Ref Rng & Units 12/12/2018 12/11/2018 12/10/2018  Glucose 70 - 99 mg/dL 116(H) 133(H) 116(H)  BUN 8 - 23 mg/dL 31(H) 34(H) 33(H)  Creatinine 0.61 - 1.24 mg/dL 0.71 0.81 0.86  Sodium 135 - 145 mmol/L 128(L) 129(L) 127(L)  Potassium 3.5 - 5.1 mmol/L 4.4 4.8 4.7  Chloride 98 - 111 mmol/L 97(L) 96(L) 91(L)  CO2 22 - 32 mmol/L _0 Calcium 8.9 - 10.3 mg/dL 8.6(L) 9.0 8.8(L)  Total Protein 6.5 - 8.1 g/dL - - -  Total Bilirubin 0.3 - 1.2 mg/dL - - -  Alkaline Phos 38 - 126 U/L - - -  AST 15 - 41 U/L - - -  ALT 0 - 44 U/L - - -    Lab Results  Component Value Date   WBC 7.8 12/07/2018   HGB 14.2 12/07/2018   HCT 43.0 12/07/2018   MCV 88.1 12/07/2018   PLT 197 12/07/2018   NEUTROABS 5.1 12/01/2018    Ct Chest W [name]  Result Date: 12/02/2018 CLINICAL DATA:  Restaging lung cancer. EXAM: CT CHEST, ABDOMEN, AND PELVIS WITH [name]: Multidetector CT imaging of the chest, abdomen and pelvis was performed following the standard protocol during bolus administration of intravenous [name]. [name]:  166m OMNIPAQUE IOHEXOL 300 MG/ML  SOLN COMPARISON:  None. CT chest 07/10/2018 and CT AP from 05/24/2018. FINDINGS: CT CHEST FINDINGS Cardiovascular: Normal heart size. No pericardial effusion  identified. Aortic atherosclerosis. Three vessel coronary artery atherosclerotic calcifications. Left chest wall pacer device is noted with lead in the right atrial appendage and coronary sinus. Mediastinum/Nodes: Normal appearance of the thyroid gland. The trachea appears patent and is midline. Normal appearance of the esophagus. No mediastinal or hilar adenopathy. No axillary or supraclavicular adenopathy. Lungs/Pleura: No pleural effusion identified. Status post left upper lobectomy. Masslike architectural distortion within the perihilar left upper lobe adjacent to the suture line with associated loculated pleural thickening and fluid is unchanged from the previous exam. Within the superior segment of the right lower lobe there is a part solid nodule measuring 2.9 cm, image 53/4. Previously 3.3 cm. Adjacent architectural distortion and pleural thickening is similar. Postsurgical changes within the medial aspect of the right upper lung with suture line is unchanged. Purely ground-glass attenuating nodule within the anterior right middle lobe measures 1.0 cm, image 74/4. Unchanged. Part solid nodule within the anterolateral right lower lobe measures 5 cm, image 91/4. Stable from previous study. Calcified granuloma identified in the subpleural, posterior right lower lobe. No new pulmonary nodule or mass identified bilaterally. Musculoskeletal: Thoracic spondylosis. Unchanged displaced right lateral fifth rib deformity, image 33/3. No acute or suspicious osseous abnormality. CT ABDOMEN PELVIS FINDINGS Hepatobiliary: No focal liver abnormality is seen. No gallstones, gallbladder wall thickening, or  biliary dilatation. Pancreas: Unremarkable. No pancreatic ductal dilatation or surrounding inflammatory changes. Spleen: Normal in size without focal abnormality. Adrenals/Urinary Tract: Status post right adrenalectomy. Normal left adrenal gland. The kidneys are unremarkable. Thick walled urinary bladder is collapsed around  a Foley catheter. Stomach/Bowel: Stomach is within normal limits. Appendix appears normal. No evidence of bowel wall thickening, distention, or inflammatory changes. Vascular/Lymphatic: Aortic atherosclerosis. No aneurysm identified. No pelvic or inguinal adenopathy. Reproductive: Prostate gland enlargement. Other: No free fluid or fluid collections. Musculoskeletal: No acute or suspicious osseous abnormality. Lumbar degenerative disc disease. There are dysplastic changes involving the right femoral head with marked secondary degenerative changes. Moderate degenerative changes involve the left hip. IMPRESSION: 1. Stable exam. Similar appearance of post treatment changes within bilateral lungs without evidence for disease progression/recurrence. 2. Stable appearance of part solid nodules within the right lung. 3. Pure ground-glass attenuating nodule within the anterior right upper lobe is also unchanged. 4. Aortic Atherosclerosis (ICD10-I70.0) and Emphysema (ICD10-J43.9). 5. Coronary artery atherosclerotic calcifications. Electronically Signed   By: Kerby Moors M.D.   On: 12/02/2018 14:41   Ct Thoracic Spine W [name]  Result Date: 11/26/2018 CLINICAL DATA:  73 year old male with grade 2 lower lumbar spondylolisthesis status post posterior decompression of the lumbar spine in May this year. Evidence of solid ankylosis at the L4-L5 spondylolisthesis level, however worsening weakness and neurologic symptoms since the time of surgery. History of lung cancer with multiple recurrences. FLUOROSCOPY TIME:  1 minutes 12 seconds PROCEDURE: LUMBAR PUNCTURE FOR THORACIC AND LUMBAR MYELOGRAM Lumbar puncture and intrathecal [name] administration were performed by Dr. Kristeen Miss who will separately report for the portion of the procedure. I personally supervised acquisition of the myelogram images. COMPARISON:  Postoperative lumbar spine CT 09/26/2018. Preoperative CT 07/10/2018. chest CT 07/10/2018. TECHNIQUE:  Contiguous axial images were obtained through the Thoracic and Lumbar spine after the intrathecal infusion of infusion. Coronal and sagittal reconstructions were obtained of the axial image sets. FINDINGS: MYELOGRAM FINDINGS: Lumbar puncture was performed at the upper lumbar level. On the initial postcontrast images there is 5-6 millimeter round filling defect occupying the left half of the thecal sac at the L3 vertebral body level. This became more difficult to see over the course of the myelographic images and will be reported in the lumbar CT below. Stable spondylolisthesis of L4 on L5. Stable vertebral height and alignment elsewhere including mild retrolisthesis of L1 on L2. Good lumbar intrathecal sac opacification, with no lumbar spinal stenosis from L2 to the sacrum. There is a defect on the ventral thecal sac at L1-L2 related to chronic disc osteophyte complex, appearing similar to that expected based on the July CT. A 2nd lateral view in mild Trendelenburg demonstrates [name] flow beyond this level, and normal appearing thecal sac patency at T12-L1. See additional post myelogram CT findings of the thoracic spine below. CT THORACIC MYELOGRAM FINDINGS: Decision was made following the acquisition of plain lumbar myelogram images to obtain CT post myelogram images also of the thoracic spine. Thoracic spine images were then obtained in a delayed fashion at 1241 hours (versus CT acquisition at 1133 hours of the lumbar spine. Only trace myelographic [name] is present in the thoracic spine, and seems to be mostly subdural space [name]. However, on axial series 7 images 68 through 110 there does appear to be a small amount of intrathecal thoracic [name] delineating a nonenlarged thoracic spinal cord from the T7 through the T10 level. Additionally the conus from T11-T12 continues to have an unusual post  myelographic appearance which seems unlikely just to the mixed [name] injection (over an hour delayed  from the lumbar images). And furthermore there remains a nodular filling defect in the right thecal sac at T12 (in addition to the multifocal cauda equina nodularity described below). There is no evidence of degenerative thoracic spinal stenosis. No acute or suspicious osseous lesion is identified in the thoracic spine. Persistent masslike opacity at the left hilum and in the visible posterior left upper lung pleural space. Partially visible persistent right upper lobe curvilinear confluent opacity on series 6, image 40. No new or increased mediastinal lymph nodes are identified. CT LUMBAR MYELOGRAM FINDINGS: Stable visible abdominal viscera including Calcified aortic atherosclerosis. And right adrenal region surgical clips. Stable posterior paraspinal soft tissues. No acute osseous abnormality identified. Stable posterior postoperative changes at L3-L4 and L4-L5. Solid interbody ankylosis redemonstrated at L4-L5. Intact visible sacrum and SI joints. T12-L1: The tip of the conus is at this level, above which there is only trace subdural intraspinal [name]. A filling defect in the right thecal sac on series 5, image 41 was initially felt probably related to the mixed injection surrounding the exiting right T12 nerve - although that seems less likely given the persistence on the delayed thoracic CT described above. No spinal stenosis at this level, minor disc bulging and endplate spurring appear stable since July. L1-L2: Chronic mild retrolisthesis with disc space loss and circumferential disc osteophyte complex. Mild spinal stenosis and mild to moderate left lateral recess stenosis (series 5, image 57). Moderate bilateral L1 foraminal stenosis. This level appears stable since July. L2-L3: Circumferential disc bulge and mild to moderate posterior element hypertrophy. No spinal stenosis. Borderline to mild left lateral recess stenosis. Mild to moderate left and borderline to mild right L2 foraminal stenosis. L3:  Posterior to the L3 vertebral body to the left of midline there is a small 4-5 millimeter low-density filling defect associated with the cauda equina (series 5, image 80 and sagittal series 8, image 35), around which there is mildly hyperdense [name], which appears similar to the epidural/subdural [name] also in this region (see bilateral exiting L3 nerve roots). Just cephalad of this lesion and near the midline there is a subtle 2 millimeter area of thickening of the cauda equina (series 5, image 76). L3-L4: Posterior decompression of this level. Circumferential disc osteophyte complex and residual posterior element hypertrophy with widely patent thecal sac. Borderline to mild bilateral L3 foraminal stenosis. L4-L5: Chronic grade 2 spondylolisthesis with interbody ankylosis and posterior decompression. There is mildly irregular thickening of multiple cauda equina nerve roots also at this level (series 5, image 95) most of which are not displaced outwardly along the walls of the thecal sac. No spinal stenosis. No lateral recess stenosis. Moderate to severe bilateral L4 foraminal stenosis which is probably greater on the right. L5-S1:  Mild disc bulge and endplate spurring without stenosis. IMPRESSION: 1. 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 Mild chronic appearing lumbar spinal stenosis at L1-L2 is likely stable since that time, with decompressed L3-L4 and L4-L5 levels and no other significant lumbar spinal stenosis. 2. Non-specific thickening and nodularity of the cauda equina at the T12, L3, and L4 levels, with a superimposed unusual and bulbous appearance of the conus medullaris, which though initially felt related to a mixed [name] injection at the time of lumbar CT images (1133 hours) remained unchanged on delayed thoracic spine images at 1241 hours. And with no  interval cephalad migration of intrathecal [name], despite  absent CT evidence of any thoracic spinal stenosis. 3. This constellation of clinical and imaging findings therefore raises the possibility of intrathecal metastatic disease with involvement of the conus and cauda equina. These studies were discussed by telephone with Dr. Kristeen Miss on 11/26/2018 at 13:20. Electronically Signed   By: Genevie Ann M.D.   On: 11/26/2018 13:25   Ct Lumbar Spine W [name]  Result Date: 11/26/2018 CLINICAL DATA:  73 year old male with grade 2 lower lumbar spondylolisthesis status post posterior decompression of the lumbar spine in May this year. Evidence of solid ankylosis at the L4-L5 spondylolisthesis level, however worsening weakness and neurologic symptoms since the time of surgery. History of lung cancer with multiple recurrences. FLUOROSCOPY TIME:  1 minutes 12 seconds PROCEDURE: LUMBAR PUNCTURE FOR THORACIC AND LUMBAR MYELOGRAM Lumbar puncture and intrathecal [name] administration were performed by Dr. Kristeen Miss who will separately report for the portion of the procedure. I personally supervised acquisition of the myelogram images. COMPARISON:  Postoperative lumbar spine CT 09/26/2018. Preoperative CT 07/10/2018. chest CT 07/10/2018. TECHNIQUE: Contiguous axial images were obtained through the Thoracic and Lumbar spine after the intrathecal infusion of infusion. Coronal and sagittal reconstructions were obtained of the axial image sets. FINDINGS: MYELOGRAM FINDINGS: Lumbar puncture was performed at the upper lumbar level. On the initial postcontrast images there is 5-6 millimeter round filling defect occupying the left half of the thecal sac at the L3 vertebral body level. This became more difficult to see over the course of the myelographic images and will be reported in the lumbar CT below. Stable spondylolisthesis of L4 on L5. Stable vertebral height and alignment elsewhere including mild retrolisthesis of L1 on L2. Good lumbar intrathecal sac opacification, with no lumbar  spinal stenosis from L2 to the sacrum. There is a defect on the ventral thecal sac at L1-L2 related to chronic disc osteophyte complex, appearing similar to that expected based on the July CT. A 2nd lateral view in mild Trendelenburg demonstrates [name] flow beyond this level, and normal appearing thecal sac patency at T12-L1. See additional post myelogram CT findings of the thoracic spine below. CT THORACIC MYELOGRAM FINDINGS: Decision was made following the acquisition of plain lumbar myelogram images to obtain CT post myelogram images also of the thoracic spine. Thoracic spine images were then obtained in a delayed fashion at 1241 hours (versus CT acquisition at 1133 hours of the lumbar spine. Only trace myelographic [name] is present in the thoracic spine, and seems to be mostly subdural space [name]. However, on axial series 7 images 68 through 110 there does appear to be a small amount of intrathecal thoracic [name] delineating a nonenlarged thoracic spinal cord from the T7 through the T10 level. Additionally the conus from T11-T12 continues to have an unusual post myelographic appearance which seems unlikely just to the mixed [name] injection (over an hour delayed from the lumbar images). And furthermore there remains a nodular filling defect in the right thecal sac at T12 (in addition to the multifocal cauda equina nodularity described below). There is no evidence of degenerative thoracic spinal stenosis. No acute or suspicious osseous lesion is identified in the thoracic spine. Persistent masslike opacity at the left hilum and in the visible posterior left upper lung pleural space. Partially visible persistent right upper lobe curvilinear confluent opacity on series 6, image 40. No new or increased mediastinal lymph nodes are identified. CT LUMBAR MYELOGRAM FINDINGS: Stable visible abdominal viscera including Calcified aortic atherosclerosis. And  right adrenal region surgical clips. Stable  posterior paraspinal soft tissues. No acute osseous abnormality identified. Stable posterior postoperative changes at L3-L4 and L4-L5. Solid interbody ankylosis redemonstrated at L4-L5. Intact visible sacrum and SI joints. T12-L1: The tip of the conus is at this level, above which there is only trace subdural intraspinal [name]. A filling defect in the right thecal sac on series 5, image 41 was initially felt probably related to the mixed injection surrounding the exiting right T12 nerve - although that seems less likely given the persistence on the delayed thoracic CT described above. No spinal stenosis at this level, minor disc bulging and endplate spurring appear stable since July. L1-L2: Chronic mild retrolisthesis with disc space loss and circumferential disc osteophyte complex. Mild spinal stenosis and mild to moderate left lateral recess stenosis (series 5, image 57). Moderate bilateral L1 foraminal stenosis. This level appears stable since July. L2-L3: Circumferential disc bulge and mild to moderate posterior element hypertrophy. No spinal stenosis. Borderline to mild left lateral recess stenosis. Mild to moderate left and borderline to mild right L2 foraminal stenosis. L3: Posterior to the L3 vertebral body to the left of midline there is a small 4-5 millimeter low-density filling defect associated with the cauda equina (series 5, image 80 and sagittal series 8, image 35), around which there is mildly hyperdense [name], which appears similar to the epidural/subdural [name] also in this region (see bilateral exiting L3 nerve roots). Just cephalad of this lesion and near the midline there is a subtle 2 millimeter area of thickening of the cauda equina (series 5, image 76). L3-L4: Posterior decompression of this level. Circumferential disc osteophyte complex and residual posterior element hypertrophy with widely patent thecal sac. Borderline to mild bilateral L3 foraminal stenosis. L4-L5: Chronic grade 2  spondylolisthesis with interbody ankylosis and posterior decompression. There is mildly irregular thickening of multiple cauda equina nerve roots also at this level (series 5, image 95) most of which are not displaced outwardly along the walls of the thecal sac. No spinal stenosis. No lateral recess stenosis. Moderate to severe bilateral L4 foraminal stenosis which is probably greater on the right. L5-S1:  Mild disc bulge and endplate spurring without stenosis. IMPRESSION: 1. 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 Mild chronic appearing lumbar spinal stenosis at L1-L2 is likely stable since that time, with decompressed L3-L4 and L4-L5 levels and no other significant lumbar spinal stenosis. 2. Non-specific thickening and nodularity of the cauda equina at the T12, L3, and L4 levels, with a superimposed unusual and bulbous appearance of the conus medullaris, which though initially felt related to a mixed [name] injection at the time of lumbar CT images (1133 hours) remained unchanged on delayed thoracic spine images at 1241 hours. And with no interval cephalad migration of intrathecal [name], despite absent CT evidence of any thoracic spinal stenosis. 3. This constellation of clinical and imaging findings therefore raises the possibility of intrathecal metastatic disease with involvement of the conus and cauda equina. These studies were discussed by telephone with Dr. Kristeen Miss on 11/26/2018 at 13:20. Electronically Signed   By: Genevie Ann M.D.   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 Brain staging. The patient has an MRI compatible cardiac pacemaker. EXAM: MRI HEAD WITHOUT AND WITH  [name]:  Multiplanar, multiecho pulse sequences of the brain and surrounding structures were obtained without and with intravenous [name]. [name]:  52m GADAVIST GADOBUTROL 1 MMOL/ML IV SOLN COMPARISON:  Thoracic and lumbar MRI yesterday. FINDINGS: Brain: No restricted diffusion to suggest acute infarction. No midline shift, mass effect, ventriculomegaly, extra-axial collection or acute intracranial hemorrhage. Cervicomedullary junction and pituitary are within normal limits. Prior to [name] gray and white matter signal is largely normal for age throughout the brain. 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 But otherwise only minimal nonspecific white matter T2 and FLAIR hyperintensity. Following [name] there is no abnormal intracranial enhancement identified. No leptomeningeal or dural thickening identified inside the skull. Also the visible cervical spinal cord to C3-C4 seems to remain normal. Vascular: The distal right vertebral artery flow void is lost on series 10, image 3. The left vertebral appears mildly dominant. The other Major intracranial vascular flow voids are preserved. The major dural venous sinuses are enhancing and appear to be patent. Skull and upper cervical spine: Visualized bone marrow signal is within normal limits. C3-C4 disc and endplate degeneration. Sinuses/Orbits: Negative orbits. Trace paranasal sinus mucosal thickening. Other: Mastoids are clear. Visible internal auditory structures appear normal. Right lateral convexity scalp lipoma incidentally noted on series 17, image 14. However, there is a nonspecific 12 millimeter heterogeneously enhancing scalp lesion located near the right temple on series 19, image 18 (note abnormal diffusion on series 7, image 56). IMPRESSION: 1. No metastatic disease to the brain or cerebral meninges is identified. 2. Possible 12 mm scalp soft tissue metastasis near the right temple (series 19, image 18). Alternatively this might be an  inflamed proteinaceous dermal lesion. 3. No metastatic disease identified in the visible upper cervical spine. Electronically Signed   By: HGenevie AnnM.D.   On: 11/29/2018 16:05   [facility] Thoracic Spine W Wo [name]  Result Date: 11/28/2018 CLINICAL DATA:  Bilateral leg weakness. Abnormal CT myelogram 11/26/2018. History of lung cancer. The patient has a pacemaker which is MRI compatible. EXAM: MRI THORACIC WITHOUT AND WITH [name]: Multiplanar and multiecho pulse sequences of the thoracic spine were obtained without and with intravenous [name]. [name]:  958mGADAVIST GADOBUTROL 1 MMOL/ML IV SOLN COMPARISON:  CT myelogram 11/26/2018 FINDINGS: MRI THORACIC SPINE FINDINGS Alignment:  Normal Vertebrae: Fatty bone marrow changes are present T3 through T7 compatible prior chest radiation for left-sided lung cancer. No thoracic fracture or metastatic disease to the spine. Cord: Expansile mass lesion in the conus medullaris at the T11 and T12 level. The mass measures 14 x 19 x 37 mm and appears intramedullary. There is significant cord edema above the mass. Cord edema continues into the mid and upper thoracic cord. Postcontrast images reveal multiple enhancing masses along the surface of the cord at T1, T2, T3, T5, and T6. Likely several other smaller lesions are also present. Enhancement extends into the left T3 neural foramina. Paraspinal and other soft tissues: Left upper lobe mass. No significant pleural effusion. Right upper lobe abnormality possibly metastatic disease. Disc levels: 2 mm anterolisthesis C7-T1 due to facet degeneration. Mild thoracic disc degeneration. Negative for disc protrusion. IMPRESSION: Enhancing mass lesion in the conus medullaris. Multiple small enhancing masses along the surface of the cord in the upper and midthoracic spine. 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 Recommend MRI of the brain without with [name] evaluate for metastatic  disease. No evidence of bony metastatic disease These results were called by telephone  at the time of interpretation on 11/28/2018 at 2:35 pm to provider [facility] , who verbally acknowledged these results. Electronically Signed   By: Franchot Gallo M.D.   On: 11/28/2018 14:36   [facility] Lumbar Spine W Wo [name]  Result Date: 11/28/2018 CLINICAL DATA:  Back pain. 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 History of lung cancer. Lumbar laminectomy 07/25/2018 L4-5 level. Abnormal myelogram 11/26/2018 EXAM: MRI LUMBAR SPINE WITHOUT AND WITH [name]: Multiplanar and multiecho pulse sequences of the lumbar spine were obtained without and with intravenous [name]. [name]:  29m GADAVIST GADOBUTROL 1 MMOL/ML IV SOLN COMPARISON:  Thoracic and lumbar myelogram 11/26/2018 FINDINGS: Segmentation:  Normal Alignment:  Mild retrolisthesis L1-2, 4 mm. 16 mm anterolisthesis L4-5 with severe disc degeneration at this level and bilateral pars defects of L4 Vertebrae: Negative for vertebral fracture or mass lesion. Small Schmorl's node with surrounding bone marrow edema at L1-2. Conus medullaris and cauda equina: Conus medullaris appears to terminate at T12-L1. There is and expansile mass lesion in the distal spinal cord and conus medullaris which shows heterogeneous signal intensity and enhancement. The mass measures approximately 14 x 9 mm on transverse images and extends over a 37 mm length. Small areas of hemorrhage are present within the mass. This accounts for the intradural mass identified on myelogram. There is edema in the cord above the mass. Paraspinal and other soft tissues: Negative for retroperitoneal mass or adenopathy. Urinary bladder is distended with Foley catheter. Disc levels: T12-L1: Mild facet degeneration.  Negative for stenosis L1-2: 4 mm retrolisthesis with disc and facet degeneration. Mild spinal stenosis and mild subarticular stenosis bilaterally L2-3: Diffuse bulging  of the disc. Shallow foraminal disc protrusion bilaterally. Mild facet degeneration. Mild spinal stenosis and mild subarticular stenosis bilaterally L3-4: Diffuse disc bulging and mild facet degeneration. Negative for stenosis L4-5: 16 mm anterolisthesis with bilateral pars defects. Moderate to severe foraminal encroachment bilaterally with impingement of the L4 nerve root bilaterally. L5-S1: Small central disc protrusion. Mild flattening of the right S1 nerve root. IMPRESSION: Enhancing mass involving the distal spinal cord and conus medullaris. This caused myelographic near complete block 2 days ago. 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 Review of the thoracic MRI demonstrates multiple enhancing masses along the surface of the cord in the thoracic spine, therefore metastatic disease is felt to be most likely. Negative for bony metastatic disease lumbar spine Grade 2 slip L4-5 due to bilateral pars defects of L4 resulting and moderate to severe foraminal encroachment bilaterally. These results were called by telephone at the time of interpretation on 11/28/2018 at 2:26 pm [facility] verbally acknowledged these results. Electronically Signed   By: CFranchot GalloM.D.   On: 11/28/2018 14:28   Ct Abdomen Pelvis W [name]  Result Date: 12/02/2018 CLINICAL DATA:  Restaging lung cancer. EXAM: CT CHEST, ABDOMEN, AND PELVIS WITH [name]: Multidetector CT imaging of the chest, abdomen and pelvis was performed following the standard protocol during bolus administration of intravenous [name]. [name]:  1073mOMNIPAQUE IOHEXOL 300 MG/ML  SOLN COMPARISON:  None. CT chest 07/10/2018 and CT AP from 05/24/2018. FINDINGS: CT CHEST FINDINGS Cardiovascular: Normal heart size. No pericardial effusion identified. Aortic atherosclerosis. Three vessel coronary artery atherosclerotic calcifications. Left chest wall pacer device is noted with lead in the right atrial  appendage and coronary sinus. Mediastinum/Nodes: Normal appearance of the thyroid gland. The trachea appears patent and is midline. Normal appearance of the esophagus. No mediastinal or hilar adenopathy.  No axillary or supraclavicular adenopathy. Lungs/Pleura: No pleural effusion identified. Status post left upper lobectomy. Masslike architectural distortion within the perihilar left upper lobe adjacent to the suture line with associated loculated pleural thickening and fluid is unchanged from the previous exam. Within the superior segment of the right lower lobe there is a part solid nodule measuring 2.9 cm, image 53/4. Previously 3.3 cm. Adjacent architectural distortion and pleural thickening is similar. Postsurgical changes within the medial aspect of the right upper lung with suture line is unchanged. Purely ground-glass attenuating nodule within the anterior right middle lobe measures 1.0 cm, image 74/4. Unchanged. Part solid nodule within the anterolateral right lower lobe measures 5 cm, image 91/4. Stable from previous study. Calcified granuloma identified in the subpleural, posterior right lower lobe. No new pulmonary nodule or mass identified bilaterally. Musculoskeletal: Thoracic spondylosis. Unchanged displaced right lateral fifth rib deformity, image 33/3. No acute or suspicious osseous abnormality. CT ABDOMEN PELVIS FINDINGS Hepatobiliary: No focal liver abnormality is seen. No gallstones, gallbladder wall thickening, or biliary dilatation. Pancreas: Unremarkable. No pancreatic ductal dilatation or surrounding inflammatory changes. Spleen: Normal in size without focal abnormality. Adrenals/Urinary Tract: Status post right adrenalectomy. Normal left adrenal gland. The kidneys are unremarkable. Thick walled urinary bladder is collapsed around a Foley catheter. Stomach/Bowel: Stomach is within normal limits. Appendix appears normal. No evidence of bowel wall thickening, distention, or inflammatory  changes. Vascular/Lymphatic: Aortic atherosclerosis. No aneurysm identified. No pelvic or inguinal adenopathy. Reproductive: Prostate gland enlargement. Other: No free fluid or fluid collections. Musculoskeletal: No acute or suspicious osseous abnormality. Lumbar degenerative disc disease. There are dysplastic changes involving the right femoral head with marked secondary degenerative changes. Moderate degenerative changes involve the left hip. IMPRESSION: 1. Stable exam. Similar appearance of post treatment changes within bilateral lungs without evidence for disease progression/recurrence. 2. Stable appearance of part solid nodules within the right lung. 3. Pure ground-glass attenuating nodule within the anterior right upper lobe is also unchanged. 4. Aortic Atherosclerosis (ICD10-I70.0) and Emphysema (ICD10-J43.9). 5. Coronary artery atherosclerotic calcifications. Electronically Signed   By: Kerby Moors M.D.   On: 12/02/2018 14:41   Dg Myelogram Lumbar  Result Date: 11/26/2018 CLINICAL DATA:  73 year old male with grade 2 lower lumbar spondylolisthesis status post posterior decompression of the lumbar spine in May this year. Evidence of solid ankylosis at the L4-L5 spondylolisthesis level, however worsening weakness and neurologic symptoms since the time of surgery. History of lung cancer with multiple recurrences. FLUOROSCOPY TIME:  1 minutes 12 seconds PROCEDURE: LUMBAR PUNCTURE FOR THORACIC AND LUMBAR MYELOGRAM Lumbar puncture and intrathecal [name] administration were performed by Dr. Kristeen Miss who will separately report for the portion of the procedure. I personally supervised acquisition of the myelogram images. COMPARISON:  Postoperative lumbar spine CT 09/26/2018. Preoperative CT 07/10/2018. chest CT 07/10/2018. TECHNIQUE: Contiguous axial images were obtained through the Thoracic and Lumbar spine after the intrathecal infusion of infusion. Coronal and sagittal reconstructions were obtained of  the axial image sets. FINDINGS: MYELOGRAM FINDINGS: Lumbar puncture was performed at the upper lumbar level. On the initial postcontrast images there is 5-6 millimeter round filling defect occupying the left half of the thecal sac at the L3 vertebral body level. This became more difficult to see over the course of the myelographic images and will be reported in the lumbar CT below. Stable spondylolisthesis of L4 on L5. Stable vertebral height and alignment elsewhere including mild retrolisthesis of L1 on L2. Good lumbar intrathecal sac opacification, with no lumbar spinal stenosis from L2  to the sacrum. There is a defect on the ventral thecal sac at L1-L2 related to chronic disc osteophyte complex, appearing similar to that expected based on the July CT. A 2nd lateral view in mild Trendelenburg demonstrates [name] flow beyond this level, and normal appearing thecal sac patency at T12-L1. See additional post myelogram CT findings of the thoracic spine below. CT THORACIC MYELOGRAM FINDINGS: Decision was made following the acquisition of plain lumbar myelogram images to obtain CT post myelogram images also of the thoracic spine. Thoracic spine images were then obtained in a delayed fashion at 1241 hours (versus CT acquisition at 1133 hours of the lumbar spine. Only trace myelographic [name] is present in the thoracic spine, and seems to be mostly subdural space [name]. However, on axial series 7 images 68 through 110 there does appear to be a small amount of intrathecal thoracic [name] delineating a nonenlarged thoracic spinal cord from the T7 through the T10 level. Additionally the conus from T11-T12 continues to have an unusual post myelographic appearance which seems unlikely just to the mixed [name] injection (over an hour delayed from the lumbar images). And furthermore there remains a nodular filling defect in the right thecal sac at T12 (in addition to the multifocal cauda equina nodularity described  below). There is no evidence of degenerative thoracic spinal stenosis. No acute or suspicious osseous lesion is identified in the thoracic spine. Persistent masslike opacity at the left hilum and in the visible posterior left upper lung pleural space. Partially visible persistent right upper lobe curvilinear confluent opacity on series 6, image 40. No new or increased mediastinal lymph nodes are identified. CT LUMBAR MYELOGRAM FINDINGS: Stable visible abdominal viscera including Calcified aortic atherosclerosis. And right adrenal region surgical clips. Stable posterior paraspinal soft tissues. No acute osseous abnormality identified. Stable posterior postoperative changes at L3-L4 and L4-L5. Solid interbody ankylosis redemonstrated at L4-L5. Intact visible sacrum and SI joints. T12-L1: The tip of the conus is at this level, above which there is only trace subdural intraspinal [name]. A filling defect in the right thecal sac on series 5, image 41 was initially felt probably related to the mixed injection surrounding the exiting right T12 nerve - although that seems less likely given the persistence on the delayed thoracic CT described above. No spinal stenosis at this level, minor disc bulging and endplate spurring appear stable since July. L1-L2: Chronic mild retrolisthesis with disc space loss and circumferential disc osteophyte complex. Mild spinal stenosis and mild to moderate left lateral recess stenosis (series 5, image 57). Moderate bilateral L1 foraminal stenosis. This level appears stable since July. L2-L3: Circumferential disc bulge and mild to moderate posterior element hypertrophy. No spinal stenosis. Borderline to mild left lateral recess stenosis. Mild to moderate left and borderline to mild right L2 foraminal stenosis. L3: Posterior to the L3 vertebral body to the left of midline there is a small 4-5 millimeter low-density filling defect associated with the cauda equina (series 5, image 80 and  sagittal series 8, image 35), around which there is mildly hyperdense [name], which appears similar to the epidural/subdural [name] also in this region (see bilateral exiting L3 nerve roots). Just cephalad of this lesion and near the midline there is a subtle 2 millimeter area of thickening of the cauda equina (series 5, image 76). L3-L4: Posterior decompression of this level. Circumferential disc osteophyte complex and residual posterior element hypertrophy with widely patent thecal sac. Borderline to mild bilateral L3 foraminal stenosis. L4-L5: Chronic grade 2 spondylolisthesis with interbody  ankylosis and posterior decompression. There is mildly irregular thickening of multiple cauda equina nerve roots also at this level (series 5, image 95) most of which are not displaced outwardly along the walls of the thecal sac. No spinal stenosis. No lateral recess stenosis. Moderate to severe bilateral L4 foraminal stenosis which is probably greater on the right. L5-S1:  Mild disc bulge and endplate spurring without stenosis. IMPRESSION: 1. 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 Mild chronic appearing lumbar spinal stenosis at L1-L2 is likely stable since that time, with decompressed L3-L4 and L4-L5 levels and no other significant lumbar spinal stenosis. 2. Non-specific thickening and nodularity of the cauda equina at the T12, L3, and L4 levels, with a superimposed unusual and bulbous appearance of the conus medullaris, which though initially felt related to a mixed [name] injection at the time of lumbar CT images (1133 hours) remained unchanged on delayed thoracic spine images at 1241 hours. And with no interval cephalad migration of intrathecal [name], despite absent CT evidence of any thoracic spinal stenosis. 3. This constellation of clinical and imaging findings therefore raises the possibility of intrathecal metastatic disease  with involvement of the conus and cauda equina. These studies were discussed by telephone with Dr. Kristeen Miss on 11/26/2018 at 13:20. Electronically Signed   By: Genevie Ann M.D.   On: 11/26/2018 13:25   Vas [place] Lower Extremity Venous (dvt)  Result Date: 11/28/2018  Lower Venous Study Indications: Edema.  Risk Factors: Cancer metastatic. Comparison Study: 05/17/24 bilateral lev negative Performing Technologist: Toma Copier RVS  Examination Guidelines: A complete evaluation includes B-mode imaging, spectral Doppler, color Doppler, and power Doppler as needed of all accessible portions of each vessel. Bilateral testing is considered an integral part of a complete examination. Limited examinations for reoccurring indications may be performed as noted.  +---------+---------------+---------+-----------+----------+-------------------+ RIGHT    CompressibilityPhasicitySpontaneityPropertiesThrombus Aging      +---------+---------------+---------+-----------+----------+-------------------+ CFV      Full           Yes      Yes                                      +---------+---------------+---------+-----------+----------+-------------------+ SFJ      Full                                                             +---------+---------------+---------+-----------+----------+-------------------+ FV Prox  Full           Yes      Yes                                      +---------+---------------+---------+-----------+----------+-------------------+ FV Mid   Full                                                             +---------+---------------+---------+-----------+----------+-------------------+ FV DistalFull  Yes      Yes                                      +---------+---------------+---------+-----------+----------+-------------------+ PFV      Full           Yes      Yes                                       +---------+---------------+---------+-----------+----------+-------------------+ POP                     Yes      Yes                  Patient unable to                                                         bend the knee for                                                         compression         +---------+---------------+---------+-----------+----------+-------------------+ PTV      Full                                                             +---------+---------------+---------+-----------+----------+-------------------+ PERO     Full                                                             +---------+---------------+---------+-----------+----------+-------------------+   +---------+---------------+---------+-----------+----------+--------------+ LEFT     CompressibilityPhasicitySpontaneityPropertiesThrombus Aging +---------+---------------+---------+-----------+----------+--------------+ CFV      Full           Yes      Yes                                 +---------+---------------+---------+-----------+----------+--------------+ SFJ      Full                                                        +---------+---------------+---------+-----------+----------+--------------+ FV Prox  Full           Yes      Yes                                 +---------+---------------+---------+-----------+----------+--------------+  FV Mid   Full                                                        +---------+---------------+---------+-----------+----------+--------------+ FV DistalFull           Yes      Yes                                 +---------+---------------+---------+-----------+----------+--------------+ PFV      Full           Yes      Yes                                 +---------+---------------+---------+-----------+----------+--------------+ POP      Full           Yes      Yes                                  +---------+---------------+---------+-----------+----------+--------------+ PTV      Full                                                        +---------+---------------+---------+-----------+----------+--------------+ PERO     Full                                                        +---------+---------------+---------+-----------+----------+--------------+     Summary: Right: There is no evidence of deep vein thrombosis in the lower extremity. No cystic structure found in the popliteal fossa. Left: There is no evidence of deep vein thrombosis in the lower extremity. No cystic structure found in the popliteal fossa.  *See table(s) above for measurements and observations. Electronically signed by Monica Martinez MD on 11/28/2018 at 3:19:13 PM.    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  2. 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  3. 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  4. 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 A re-staging PET scan June 28, 2009, showed no evidence for disease progression. CT of the chest Jan 18, 202012 showed no evidence of disease progression. CT chest 07/07/2014 revealed no evidence of recurrent lung cancer             -Prior radiation summary               - 06/27/2017-04/19/2019SBRT Treatment: The staple line lesion in the left lungwas              treated to 50Gy in 38factions of 10Gy. The RLL lesion was treated to 54 Gy in 3 fractions  of 18 Gy.             - 09/20/15-7/10/17SBRT Treatment:  Right lower lobe was treated to 54 Gy in 3 fractions of      18 Gy.             -06/03/07- 07/05/07: 62.5 Gy in 25 fractions of 2.5 Gy to an inferior left upper lobe target             -Foundation 1 testing (06/13/2017)             -No reportable  alterations; [id number] E332, NF1 splice site [id number], POLE [id number],           RAD21 amplification, KRAS G12C, MSS, tumor mutation burden 9 5. History of questionable small bilateral axillary lymph nodes.  6. History of bradycardia - followed by Dr Acie Fredrickson.  7. Chronic exertional dyspnea following lung surgery.  8. Chronic right "hip" discomfort.  9. history of mild thrombocytopenia-followed by Dr. Felipa Eth  10. Coronary artery disease, status post a myocardial infarction in August of 2014 11.  Hospital admission 11/26/2018 cauda equina syndrome, initiation of palliative radiation to the spine 12/03/2018  Benjamin [name] appears stable.  He continues to have back pain.  I discussed the prognosis and treatment options on several occasions this week with Benjamin [name].  I think it is unlikely a diagnostic lumbar puncture would change his prognosis or offer treatment options that would impact his quality of life.  He does not wish to undergo further procedures.  He agrees to hospice care.  Recommendations: 1.  Continue radiation 2.  Narcotic analgesics as needed for pain, consider the addition of a long-acting narcotic if he takes oxycodone more frequently. 3.  [place] hosp[facility] referral, placement with hospice 4.  Please call oncology as needed, I will check on him 12/16/2018 if he remains in the hospital  LOS: 16 days   Betsy Coder, DNP, AGPCNP-BC, AOCNP 12/12/18

## 2018-12-12 NOTE — Progress Notes (Signed)
Palliative Care Progress Note  Mr. Eid continues to struggle with severe pain, but largely refuses medication because he feels "drugged". He describes profound fatigue and he is actually receiving very little opioid and he has been on klonopin for years. He is taking in very little PO -no appetite, poor taste. He seems to be enduring unnecessary pain fearing over sedation. He believes the medications are the cause, but I think it is multifactorial with some of this being radiation induced fatigue in setting of very poor nutritional status and his cancer progression.  I discussed his code status- DNR placed on the chart.  No major changes today but I added an order for IV fentanyl 12.65mcg q2 prn for pain that is not controlled with oxycodone- he may have less side effects with the fentanyl compared to other opioids.  Will continue to follow. Plan is to go back to [facility] with [facility]-will need to notify hospice day prior or early day of his discharge so they can admit him and help with the TOC.  Lane Hacker, DO Palliative Medicine [phone]  Time: 35 min Greater than 50%  of this time was spent counseling and coordinating care related to the above assessment and plan.

## 2018-12-13 ENCOUNTER — Ambulatory Visit
Admit: 2018-12-13 | Discharge: 2018-12-13 | Disposition: A | Discharge status: Home / Self Care | Payer: Medicare Other | Attending: Radiation Oncology | Admitting: Radiation Oncology

## 2018-12-13 NOTE — Progress Notes (Signed)
PROGRESS NOTE    Benjamin [name]  FAO:[id number] DOB: Feb 16, 1946 DOA: 11/26/2018 PCP: Hennie Duos, MD  Brief Narrative:73 y.o.malewith PMH of spinal stenosis,mealnoma,non-small cell lung cancer and paroxysmal atrial fibrillation on Eliquis at home presented to the ED on9/8/2020with progressive weakness in the lower extremities since last May.Patient had severe spondylitic stenosis at L4-L5. About 4 months ago he underwent surgical decompression of the severe stenosis and he was sent off to rehab. Despite efforts at rehab his legs became progressively weaker with sensory changes as well as bowel and bladder incontinence.Patient was evaluated by NS, a myelogram was performed on 9/8, as the patient had become near-complete paraplegic, which revealed high-grade block at the thoracolumbar junction.Patient has history of lung cancer, he had radiation therapies and treatment at least 6 times for the cancer since 2003. MRI thoracic and lumbar spine showedenhancing mass lesion in the conus medullaris. Multiple small enhancing masses along the surface of the cord in the upper and midthoracic spine. 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 MRI brain showed no metastases to the brain or cerebral meninges.Patient on the direction of Dr Ellene Route, admitted to medical service with neurology,neurosurgeryand radiation oncology consultations and planned for 10 days of palliative radiation inpatient treatments due to difficulty with transportation.Oncology has been having discussions with patient about further plans/prognosis.Palliative care on board for discussion about goals of care. 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 Spinal mass with paraplegia/incontinence: Per Oncology, spinal mass is likely related to prior lung cancer but could be related to his prior history of melanomaversus another malignancy. Receiving palliative RT (s/p5/10 Rxs). Oncology discussed with patient regarding poor prognosis, low likelihood of restoring bladder function and lower extremity weakness etc. They discussed option of comfort care/hospice versus diagnostic LP or biopsy of spinal mass to confirm tumor diagnosis /consider systemic treatment options.Palliative care notes reviewed.Patient staying to finish the course of XRT prior to discharge as he has no way of transportation and the difficulties with transportation.  2.Neurogenic bladder: On indwelling Foley catheter-Exchanged.  3.Hyponatremia. 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  4.Chronic diastolic heart failure:Compensated at this time. Continue Lasix and Aldactone.  5.History of complete heart block status post pacemaker-Stable.  6.History of paroxysmal atrial fibrillation-Continue metoprolol and Eliquis. 7.Benign prostatic hypertrophy-Continue Flomax. On indwelling Foley for #2  8. Sacral and buttocks stage II pressure ulcer present on admission. Continue wound care   9. Back pain/neuropathic pain: Decadron diazepam has been stopped restarted Klonopin and gabapentin.  Continue Remeron.    A small dose of IV fentanyl has been started by palliative care. 10 constipation add stool softeners.  DVT prophylaxis:On Eliquis Code Status:Full code will discuss CODE STATUS prior to discharge Family / Patient Communication:None Disposition Plan:awaiting SNF Consults palliative care and oncology Patient still with significant pain pain medications being adjusted for comfort.   Pressure Injury 12/03/18  Buttocks Right Stage II -  Partial thickness loss of dermis presenting as a shallow open ulcer with a red, pink wound bed without slough. reddened circular pressure ulcer (Active)  12/03/18 1505  Location:  Buttocks  Location Orientation: Right  Staging: Stage II -  Partial thickness loss of dermis presenting as a shallow open ulcer with a red, pink wound bed without slough.  Wound Description (Comments): reddened circular pressure ulcer  Present on Admission: Yes     Pressure Injury 12/03/18 Sacrum Stage II -  Partial thickness loss of dermis presenting as a shallow open ulcer with a red, pink wound bed without slough. (Active)  12/03/18 1505  Location: Sacrum  Location Orientation:   Staging: Stage II -  Partial thickness loss of dermis presenting as a shallow open ulcer with a red, pink wound bed without slough.  Wound Description (Comments):   Present on Admission: Yes      Nutrition Problem: Increased nutrient needs Etiology: acute illness, chronic illness, cancer and cancer related treatments, wound healing     Signs/Symptoms: estimated needs    Interventions: Ensure Enlive (each supplement provides 350kcal and 20 grams of protein), Magic cup, MVI, Prostat  Estimated body mass index is 25.77 kg/m as calculated from the following:   Height as of this encounter: 5\' 11"  (1.803 m).   Weight as of this encounter: 83.8 kg.  Subjective:  He is depressed anxious with the whole situation not sure what is helping with the pain waiting for his radiation to be finished Objective: Vitals:   12/12/18 2040 12/13/18 0639 12/13/18 0804 12/13/18 0908  BP: 102/65 106/66  117/77  Pulse: (!) 59 (!) 59  60  Resp: 18 20    Temp: 98.8 F (37.1 C) 97.9 F (36.6 C)    TempSrc: Oral Oral    SpO2: 99% 96% 96%   Weight:      Height:        Intake/Output Summary (Last 24 hours) at 12/13/2018 1151 Last data filed at 12/13/2018 0830 Gross per 24 hour  Intake 960 ml  Output 1875 ml  Net  -915 ml   Filed Weights   12/03/18 1451  Weight: 83.8 kg    Examination:  General exam: Appears calm and comfortable  Respiratory system: Clear to auscultation. Respiratory effort normal. Cardiovascular system: S1 & S2 heard, RRR. No JVD, murmurs, rubs, gallops or clicks. No pedal edema. Gastrointestinal system: Abdomen is nondistended, soft and nontender. No organomegaly or masses felt. Normal bowel sounds heard. Central nervous system: Alert and oriented. No focal neurological deficits. Extremities: Paraplegic  skin: No rashes, lesions or ulcers Psychiatry: Judgement and insight appear normal. Mood & affect appropriate.     Data Reviewed: I have personally reviewed following labs and imaging studies  CBC: Recent Labs  Lab 12/07/18 0409  WBC 7.8  HGB 14.2  HCT 43.0  MCV 88.1  PLT 297   Basic Metabolic Panel: Recent Labs  Lab 12/08/18 0904 12/09/18 0518 12/10/18 0444 12/11/18 0448 12/12/18 0512  NA 129* 128* 127* 129* 128*  K 4.2 4.4 4.7 4.8 4.4  CL 91* 93* 91* 96* 97*  CO2 27 27 28 24 24   GLUCOSE 138* 117* 116* 133* 116*  BUN 27* 32* 33* 34* 31*  CREATININE 0.76 0.79 0.86 0.81 0.71  CALCIUM 8.9 8.8* 8.8* 9.0 8.6*  MG  --  1.9  --   --   --    GFR: Estimated Creatinine Clearance: 85 mL/min (by C-G formula based on SCr of 0.71 mg/dL). Liver Function Tests: No results for input(s): AST, ALT, ALKPHOS, BILITOT, PROT, ALBUMIN in the last 168 hours. No results for input(s): LIPASE, AMYLASE in the last 168 hours. No results  for input(s): AMMONIA in the last 168 hours. Coagulation Profile: No results for input(s): INR, PROTIME in the last 168 hours. Cardiac Enzymes: No results for input(s): CKTOTAL, CKMB, CKMBINDEX, TROPONINI in the last 168 hours. BNP (last 3 results) No results for input(s): PROBNP in the last 8760 hours. HbA1C: No results for input(s): HGBA1C in the last 72 hours. CBG: No results for input(s): GLUCAP in the last 168 hours. Lipid Profile:  No results for input(s): CHOL, HDL, LDLCALC, TRIG, CHOLHDL, LDLDIRECT in the last 72 hours. Thyroid Function Tests: No results for input(s): TSH, T4TOTAL, FREET4, T3FREE, THYROIDAB in the last 72 hours. Anemia Panel: No results for input(s): VITAMINB12, FOLATE, FERRITIN, TIBC, IRON, RETICCTPCT in the last 72 hours. Sepsis Labs: No results for input(s): PROCALCITON, LATICACIDVEN in the last 168 hours.  No results found for this or any previous visit (from the past 240 hour(s)).       Radiology Studies: No results found.      Scheduled Meds: . acetaminophen  1,000 mg Oral TID  . apixaban  5 mg Oral BID  . atorvastatin  20 mg Oral Daily  . Chlorhexidine Gluconate Cloth  6 each Topical Daily  . clonazePAM  1 mg Oral TID  . feeding supplement (ENSURE ENLIVE)  237 mL Oral Q24H  . feeding supplement (PRO-STAT SUGAR FREE 64)  30 mL Oral TID BM  . furosemide  40 mg Oral Daily  . gabapentin  100 mg Oral BID  . ipratropium-albuterol  3 mL Nebulization BID  . lidocaine  1 patch Transdermal Q24H  . metoprolol succinate  25 mg Oral Daily  . polyethylene glycol  17 g Oral Daily  . sodium chloride  1 g Oral TID WC  . spironolactone  25 mg Oral Daily  . tamsulosin  0.4 mg Oral QPM   Continuous Infusions:   LOS: 17 days     Georgette Shell, MD [facility]  If 7PM-7AM, please contact night-coverage [email or web address] Password [facility] 12/13/2018, 11:51 AM

## 2018-12-13 NOTE — TOC Progression Note (Signed)
Transition of Care [facility]) - Progression Note    Patient Details  Name: Benjamin [name] MRN: [id number] Date of Birth: 20-Oct-1945  Transition of Care [facility]) CM/SW Contact  Demontae Antunes, Juliann Pulse, RN Phone Number: 12/13/2018, 12:21 PM  Clinical Narrative: Referral for hospice services @ SNF-LT-spoke to patient/brother Ronnie on phone-both in areement to Hospice services @ SNF-chose [place] rep @ [facility] will eval-they will contact patient in rm. The facility & [facility] hospice will discuss further the process for an eval since facility is not allowing other representatives into the facility.DNR on shadow chart for MD signature. D/c plan Monday, PTAR needed.      Expected Discharge Plan: [place] Barriers to Discharge: Continued Medical Work up  Expected Discharge Plan and Services Expected Discharge Plan: [place] Choice: [place] arrangements for the past 2 months: [place]                                       Social Determinants of Health (SDOH) Interventions    Readmission Risk Interventions No flowsheet data found.

## 2018-12-13 NOTE — TOC Progression Note (Signed)
Transition of Care [facility]) - Progression Note    Patient Details  Name: Benjamin [name] MRN: [id number] Date of Birth: 1946-02-15  Transition of Care [facility]) CM/SW Contact  Kano Heckmann, Juliann Pulse, RN Phone Number: 12/13/2018, 3:15 PM  Clinical Narrative:Patient w/concerns about Palliative vs Hospice-CM contacted Palliative Care Team to f/u,  radiation treatment if effective or not-[facility]-await call back.      Expected Discharge Plan: [place] Barriers to Discharge: Continued Medical Work up  Expected Discharge Plan and Services Expected Discharge Plan: [place] Choice: [place] arrangements for the past 2 months: [place]                                       Social Determinants of Health (SDOH) Interventions    Readmission Risk Interventions No flowsheet data found.

## 2018-12-14 LAB — BASIC METABOLIC PANEL
Anion gap: 9 (ref 5–15)
BUN: 28 mg/dL — ABNORMAL HIGH (ref 8–23)
CO2: 27 mmol/L (ref 22–32)
Calcium: 8.6 mg/dL — ABNORMAL LOW (ref 8.9–10.3)
Chloride: 93 mmol/L — ABNORMAL LOW (ref 98–111)
Creatinine, Ser: 0.71 mg/dL (ref 0.61–1.24)
GFR calc Af Amer: >60 mL/min (ref >60)
GFR calc non Af Amer: >60 mL/min (ref >60)
Glucose, Bld: 157 mg/dL — ABNORMAL HIGH (ref 70–99)
Potassium: 4.5 mmol/L (ref 3.5–5.1)
Sodium: 129 mmol/L — ABNORMAL LOW (ref 135–145)

## 2018-12-14 LAB — CBC
HCT: 43 % (ref 39.0–52.0)
Hemoglobin: 14.1 g/dL (ref 13.0–17.0)
MCH: 29.3 pg (ref 26.0–34.0)
MCHC: 32.8 g/dL (ref 30.0–36.0)
MCV: 89.4 fL (ref 80.0–100.0)
Platelets: 187 10*3/uL (ref 150–400)
RBC: 4.81 MIL/uL (ref 4.22–5.81)
RDW: 14.3 % (ref 11.5–15.5)
WBC: 8.3 10*3/uL (ref 4.0–10.5)
nRBC: 0 % (ref 0.0–0.2)

## 2018-12-14 NOTE — Progress Notes (Signed)
PROGRESS NOTE    Benjamin [name]  IWP:[id number] DOB: [birth year] DOA: 11/26/2018 PCP: Hennie Duos, MD  Brief Narrative: :73 y.o.malewith PMH of spinal stenosis,mealnoma,non-small cell lung cancer and paroxysmal atrial fibrillation on Eliquis at home presented to the ED on9/8/2020with progressive weakness in the lower extremities since last May.Patient had severe spondylitic stenosis at L4-L5. About 4 months ago he underwent surgical decompression of the severe stenosis and he was sent off to rehab. Despite efforts at rehab his legs became progressively weaker with sensory changes as well as bowel and bladder incontinence.Patient was evaluated by NS, a myelogram was performed on 9/8, as the patient had become near-complete paraplegic, which revealed high-grade block at the thoracolumbar junction.Patient has history of lung cancer, he had radiation therapies and treatment at least 6 times for the cancer since 2003. MRI thoracic and lumbar spine showedenhancing mass lesion in the conus medullaris. Multiple small enhancing masses along the surface of the cord in the upper and midthoracic spine. 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 MRI brain showed no metastases to the brain or cerebral meninges.Patient on the direction of Dr Ellene Route, admitted to medical service with neurology,neurosurgeryand radiation oncology consultations and planned for 10 days of palliative radiation inpatient treatments due to difficulty with transportation.Oncology has been having discussions with patient about further plans/prognosis.Palliative care on board for discussion about goals of care. Patient had a family meeting with palliative care    Assessment & Plan:   Principal Problem:   Paraplegia ([place]) Active Problems:   Anxiety   Permanent atrial fibrillation   CAD (coronary artery disease)   Hyperlipidemia   3.2 cm bronchoalveolar carcinoma of lower lobe of right  lung (HCC)   Hyponatremia   Pressure injury of skin   Palliative care by specialist   Goals of care, counseling/discussion   1. Spinal mass with paraplegia/incontinence: Per Oncology, spinal mass is likely related to prior lung cancer but could be related to his prior history of melanomaversus another malignancy. Receiving palliative RT (s/p5/10 Rxs). Oncology discussed with patient regarding poor prognosis, low likelihood of restoring bladder function and lower extremity weakness etc. They discussed option of comfort care/hospice versus diagnostic LP or biopsy of spinal mass to confirm tumor diagnosis /consider systemic treatment options.Palliative care notes reviewed.Patient staying to finish the course of XRT prior to discharge as he has no way of transportation and the difficulties with transportation.  2.Neurogenic bladder: On indwelling Foley catheter-Exchanged.  3.Hyponatremia. 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  4.Chronic diastolic heart failure:Compensated at this time. Continue Lasix and Aldactone.  5.History of complete heart block status post pacemaker-Stable.  6.History of paroxysmal atrial fibrillation-Continue metoprolol and Eliquis. 7.Benign prostatic hypertrophy-Continue Flomax. On indwelling Foley for #2  8. Sacral and buttocks stage II pressure ulcer present on admission. Continue wound care   9. Back pain/neuropathic pain:Decadron diazepam has been stopped restarted Klonopin and gabapentin. Continue Remeron.   A small dose of IV fentanyl has been started by palliative care.  10 constipation add stool softeners.  Pressure Injury 12/03/18 Buttocks Right Stage II -  Partial thickness loss of dermis presenting as a shallow open ulcer with a red, pink wound bed without slough. reddened circular pressure ulcer (Active)  12/03/18 1505  Location: Buttocks  Location Orientation: Right  Staging: Stage II -  Partial  thickness loss of dermis presenting as a shallow open ulcer with a red, pink wound bed without slough.  Wound Description (Comments): reddened circular pressure  ulcer  Present on Admission: Yes     Pressure Injury 12/03/18 Sacrum Stage II -  Partial thickness loss of dermis presenting as a shallow open ulcer with a red, pink wound bed without slough. (Active)  12/03/18 1505  Location: Sacrum  Location Orientation:   Staging: Stage II -  Partial thickness loss of dermis presenting as a shallow open ulcer with a red, pink wound bed without slough.  Wound Description (Comments):   Present on Admission: Yes      Nutrition Problem: Increased nutrient needs Etiology: acute illness, chronic illness, cancer and cancer related treatments, wound healing     Signs/Symptoms: estimated needs    Interventions: Ensure Enlive (each supplement provides 350kcal and 20 grams of protein), Magic cup, MVI, Prostat  Estimated body mass index is 25.77 kg/m as calculated from the following:   Height as of this encounter: 5\' 11"  (1.803 m).   Weight as of this encounter: 83.8 kg.   Subjective: No new complaints continues to have a lot of pain in the lower back staff trying to position him on his left and right side multiple times overnight he is not sure what pain medicine is helping him.  Objective: Vitals:   12/13/18 1927 12/13/18 2047 12/14/18 0706 12/14/18 0713  BP:  113/71 117/69   Pulse:  78 64   Resp:  16 20   Temp:  97.6 F (36.4 C) 97.9 F (36.6 C)   TempSrc:  Oral    SpO2: 90% 92% 93% 95%  Weight:      Height:        Intake/Output Summary (Last 24 hours) at 12/14/2018 0955 Last data filed at 12/14/2018 0855 Gross per 24 hour  Intake -  Output 2275 ml  Net -2275 ml   Filed Weights   12/03/18 1451  Weight: 83.8 kg    Examination:  General exam: Appears calm and comfortable  Respiratory system: Clear to auscultation. Respiratory effort normal. Cardiovascular system: S1 &  S2 heard, RRR. No JVD, murmurs, rubs, gallops or clicks. No pedal edema. Gastrointestinal system: Abdomen is nondistended, soft and nontender. No organomegaly or masses felt. Normal bowel sounds heard. Central nervous system: Alert and oriented. No focal neurological deficits. Extremities paraplegic no sensation below the knees diminished sensation about the knees  Skin stage II sacral pressure sore Psychiatry: Depressed    Data Reviewed: I have personally reviewed following labs and imaging studies  CBC: No results for input(s): WBC, NEUTROABS, HGB, HCT, MCV, PLT in the last 168 hours. Basic Metabolic Panel: Recent Labs  Lab 12/08/18 0904 12/09/18 0518 12/10/18 0444 12/11/18 0448 12/12/18 0512  NA 129* 128* 127* 129* 128*  K 4.2 4.4 4.7 4.8 4.4  CL 91* 93* 91* 96* 97*  CO2 27 27 28 24 24   GLUCOSE 138* 117* 116* 133* 116*  BUN 27* 32* 33* 34* 31*  CREATININE 0.76 0.79 0.86 0.81 0.71  CALCIUM 8.9 8.8* 8.8* 9.0 8.6*  MG  --  1.9  --   --   --    GFR: Estimated Creatinine Clearance: 85 mL/min (by C-G formula based on SCr of 0.71 mg/dL). Liver Function Tests: No results for input(s): AST, ALT, ALKPHOS, BILITOT, PROT, ALBUMIN in the last 168 hours. No results for input(s): LIPASE, AMYLASE in the last 168 hours. No results for input(s): AMMONIA in the last 168 hours. Coagulation Profile: No results for input(s): INR, PROTIME in the last 168 hours. Cardiac Enzymes: No results for input(s): CKTOTAL, CKMB, CKMBINDEX, TROPONINI  in the last 168 hours. BNP (last 3 results) No results for input(s): PROBNP in the last 8760 hours. HbA1C: No results for input(s): HGBA1C in the last 72 hours. CBG: No results for input(s): GLUCAP in the last 168 hours. Lipid Profile: No results for input(s): CHOL, HDL, LDLCALC, TRIG, CHOLHDL, LDLDIRECT in the last 72 hours. Thyroid Function Tests: No results for input(s): TSH, T4TOTAL, FREET4, T3FREE, THYROIDAB in the last 72 hours. Anemia Panel: No  results for input(s): VITAMINB12, FOLATE, FERRITIN, TIBC, IRON, RETICCTPCT in the last 72 hours. Sepsis Labs: No results for input(s): PROCALCITON, LATICACIDVEN in the last 168 hours.  No results found for this or any previous visit (from the past 240 hour(s)).       Radiology Studies: No results found.      Scheduled Meds: . acetaminophen  1,000 mg Oral TID  . apixaban  5 mg Oral BID  . atorvastatin  20 mg Oral Daily  . Chlorhexidine Gluconate Cloth  6 each Topical Daily  . clonazePAM  1 mg Oral TID  . feeding supplement (ENSURE ENLIVE)  237 mL Oral Q24H  . feeding supplement (PRO-STAT SUGAR FREE 64)  30 mL Oral TID BM  . furosemide  40 mg Oral Daily  . gabapentin  100 mg Oral BID  . ipratropium-albuterol  3 mL Nebulization BID  . lidocaine  1 patch Transdermal Q24H  . metoprolol succinate  25 mg Oral Daily  . polyethylene glycol  17 g Oral Daily  . sodium chloride  1 g Oral TID WC  . spironolactone  25 mg Oral Daily  . tamsulosin  0.4 mg Oral QPM   Continuous Infusions:   LOS: 18 days     Georgette Shell, MD [facility]  If 7PM-7AM, please contact night-coverage [email or web address] Password [facility] 12/14/2018, 9:55 AM

## 2018-12-15 NOTE — Progress Notes (Signed)
PROGRESS NOTE    Benjamin [name]  GMW:[id number] DOB: May 29, 1945 DOA: 11/26/2018 PCP: Hennie Duos, MD    Brief Narrative::73 y.o.malewith PMH of spinal stenosis,mealnoma,non-small cell lung cancer and paroxysmal atrial fibrillation on Eliquis at home presented to the ED on9/8/2020with progressive weakness in the lower extremities since last May.Patient had severe spondylitic stenosis at L4-L5. About 4 months ago he underwent surgical decompression of the severe stenosis and he was sent off to rehab. Despite efforts at rehab his legs became progressively weaker with sensory changes as well as bowel and bladder incontinence.Patient was evaluated by NS, a myelogram was performed on 9/8, as the patient had become near-complete paraplegic, which revealed high-grade block at the thoracolumbar junction.Patient has history of lung cancer, he had radiation therapies and treatment at least 6 times for the cancer since 2003. MRI thoracic and lumbar spine showedenhancing mass lesion in the conus medullaris. Multiple small enhancing masses along the surface of the cord in the upper and midthoracic spine. 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 MRI brain showed no metastases to the brain or cerebral meninges.Patient on the direction of Dr Ellene Route, admitted to medical service with neurology,neurosurgeryand radiation oncology consultations and planned for 10 days of palliative radiation inpatient treatments due to difficulty with transportation.Oncology has been having discussions with patient about further plans/prognosis.Palliative care on board for discussion about goals of care. 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 Spinal mass with paraplegia/incontinence: Per Oncology, spinal mass is likely related to prior lung cancer but could be related to his prior history of melanomaversus another malignancy. Receiving palliative RT (s/p5/10 Rxs). Oncology discussed with patient regarding poor prognosis, low likelihood of restoring bladder function and lower extremity weakness etc. They discussed option of comfort care/hospice versus diagnostic LP or biopsy of spinal mass to confirm tumor diagnosis /consider systemic treatment options.Palliative care notes reviewed.Patient staying to finish the course of XRT prior to discharge as he has no way of transportation and the difficulties with transportation.  COVID 19 ORDERED TODAY FOR POSSIBLE DC TOMORROW  2.Neurogenic bladder: On indwelling Foley catheter-Exchanged.  3.Hyponatremia. 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  4.Chronic diastolic heart failure:Compensated at this time. Continue Lasix and Aldactone.  5.History of complete heart block status post pacemaker-Stable.  6.History of paroxysmal atrial fibrillation-Continue metoprolol and Eliquis. 7.Benign prostatic hypertrophy-Continue Flomax. On indwelling Foley for #2  8. Sacral and buttocks stage II pressure ulcer present on admission. Continue wound care   9. Back pain/neuropathic pain:Decadron diazepam has been stopped restarted Klonopin and gabapentin. Continue Remeron.A small dose of IV fentanyl has been started by palliative care.  10 constipation add stool softeners.  Pressure Injury 12/03/18 Buttocks Right Stage II -  Partial thickness loss of dermis presenting as a shallow open ulcer with a red, pink wound bed without slough. reddened circular pressure ulcer (Active)  12/03/18 1505  Location: Buttocks  Location  Orientation: Right  Staging: Stage II -  Partial thickness loss of dermis presenting as a shallow open ulcer with a red, pink wound bed without slough.  Wound  Description (Comments): reddened circular pressure ulcer  Present on Admission: Yes     Pressure Injury 12/03/18 Sacrum Stage II -  Partial thickness loss of dermis presenting as a shallow open ulcer with a red, pink wound bed without slough. (Active)  12/03/18 1505  Location: Sacrum  Location Orientation:   Staging: Stage II -  Partial thickness loss of dermis presenting as a shallow open ulcer with a red, pink wound bed without slough.  Wound Description (Comments):   Present on Admission: Yes      Nutrition Problem: Increased nutrient needs Etiology: acute illness, chronic illness, cancer and cancer related treatments, wound healing     Signs/Symptoms: estimated needs    Interventions: Ensure Enlive (each supplement provides 350kcal and 20 grams of protein), Magic cup, MVI, Prostat  Estimated body mass index is 25.77 kg/m as calculated from the following:   Height as of this encounter: 5\' 11"  (1.803 m).   Weight as of this encounter: 83.8 kg.  Subjective: NO NEW COMPLAINTS  Objective: Vitals:   12/14/18 1922 12/14/18 2157 12/15/18 0506 12/15/18 0725  BP:  116/75 105/60   Pulse:  60 60   Resp:  14 14   Temp:  98.2 F (36.8 C) 97.6 F (36.4 C)   TempSrc:  Oral    SpO2: 97% 98% 90% 93%  Weight:      Height:        Intake/Output Summary (Last 24 hours) at 12/15/2018 1137 Last data filed at 12/15/2018 0800 Gross per 24 hour  Intake -  Output 2225 ml  Net -2225 ml   Filed Weights   12/03/18 1451  Weight: 83.8 kg    Examination:  General exam: Appears calm and comfortable  Respiratory system: Clear to auscultation. Respiratory effort normal. Cardiovascular system: S1 & S2 heard, RRR. No JVD, murmurs, rubs, gallops or clicks. No pedal edema. Gastrointestinal system: Abdomen is nondistended, soft and  nontender. No organomegaly or masses felt. Normal bowel sounds heard. Central nervous system: Alert and oriented. No focal neurological deficits. Extremities: 1+ pitting edema 0/5 power skin: sacral decub Psychiatry: Judgement and insight appear normal. Mood & affect appropriate.     Data Reviewed: I have personally reviewed following labs and imaging studies  CBC: Recent Labs  Lab 12/14/18 1039  WBC 8.3  HGB 14.1  HCT 43.0  MCV 89.4  PLT 889   Basic Metabolic Panel: Recent Labs  Lab 12/09/18 0518 12/10/18 0444 12/11/18 0448 12/12/18 0512 12/14/18 1039  NA 128* 127* 129* 128* 129*  K 4.4 4.7 4.8 4.4 4.5  CL 93* 91* 96* 97* 93*  CO2 27 28 24 24 27   GLUCOSE 117* 116* 133* 116* 157*  BUN 32* 33* 34* 31* 28*  CREATININE 0.79 0.86 0.81 0.71 0.71  CALCIUM 8.8* 8.8* 9.0 8.6* 8.6*  MG 1.9  --   --   --   --    GFR: Estimated Creatinine Clearance: 85 mL/min (by C-G formula based on SCr of 0.71 mg/dL). Liver Function Tests: No results for input(s): AST, ALT, ALKPHOS, BILITOT, PROT, ALBUMIN in the last 168 hours. No results for input(s): LIPASE, AMYLASE in the last 168 hours. No results for input(s): AMMONIA in the last 168 hours. Coagulation Profile: No results for input(s): INR, PROTIME in the last 168 hours. Cardiac Enzymes: No results for input(s): CKTOTAL, CKMB, CKMBINDEX, TROPONINI in the last 168 hours. BNP (last 3 results) No results for input(s): PROBNP in the last 8760 hours. HbA1C: No results for input(s):  HGBA1C in the last 72 hours. CBG: No results for input(s): GLUCAP in the last 168 hours. Lipid Profile: No results for input(s): CHOL, HDL, LDLCALC, TRIG, CHOLHDL, LDLDIRECT in the last 72 hours. Thyroid Function Tests: No results for input(s): TSH, T4TOTAL, FREET4, T3FREE, THYROIDAB in the last 72 hours. Anemia Panel: No results for input(s): VITAMINB12, FOLATE, FERRITIN, TIBC, IRON, RETICCTPCT in the last 72 hours. Sepsis Labs: No results for input(s):  PROCALCITON, LATICACIDVEN in the last 168 hours.  No results found for this or any previous visit (from the past 240 hour(s)).       Radiology Studies: No results found.      Scheduled Meds: . acetaminophen  1,000 mg Oral TID  . apixaban  5 mg Oral BID  . atorvastatin  20 mg Oral Daily  . Chlorhexidine Gluconate Cloth  6 each Topical Daily  . clonazePAM  1 mg Oral TID  . feeding supplement (ENSURE ENLIVE)  237 mL Oral Q24H  . feeding supplement (PRO-STAT SUGAR FREE 64)  30 mL Oral TID BM  . furosemide  40 mg Oral Daily  . gabapentin  100 mg Oral BID  . ipratropium-albuterol  3 mL Nebulization BID  . lidocaine  1 patch Transdermal Q24H  . metoprolol succinate  25 mg Oral Daily  . polyethylene glycol  17 g Oral Daily  . sodium chloride  1 g Oral TID WC  . spironolactone  25 mg Oral Daily  . tamsulosin  0.4 mg Oral QPM   Continuous Infusions:   LOS: 19 days     Georgette Shell, MD [facility]  If 7PM-7AM, please contact night-coverage [email or web address] Password [facility] 12/15/2018, 11:37 AM

## 2018-12-16 ENCOUNTER — Ambulatory Visit
Admit: 2018-12-16 | Discharge: 2018-12-16 | Disposition: A | Discharge status: Home / Self Care | Payer: Medicare Other | Attending: Radiation Oncology | Admitting: Radiation Oncology

## 2018-12-16 ENCOUNTER — Encounter: Payer: Self-pay | Admitting: Radiation Oncology

## 2018-12-16 NOTE — Progress Notes (Signed)
Nutrition Follow-up  RD working remotely.   DOCUMENTATION CODES:   Not applicable  INTERVENTION:  - continue Ensure Enlive once/day, Magic Cup once/day, and 30 ml prostat TID. - continue to encourage PO intakes. - weigh patient today.    NUTRITION DIAGNOSIS:   Increased nutrient needs related to acute illness, chronic illness, cancer and cancer related treatments, wound healing as evidenced by estimated needs. -ongoing  GOAL:   Patient will meet greater than or equal to 90% of their needs -likely minimally met on average  MONITOR:   PO intake, Supplement acceptance, Labs, Weight trends, Skin  ASSESSMENT:   73 y.o. male with medical history of non-small cell lung cancer and a.fib on Eliquis. He presented to the ED on 9/8 for evaluation of BLE and thoracic paralysis with sensory loss since 07/2018. He was also experiencing bladder and bowel incontinence. Patient underwent lumbar decompression surgery but did not have any improvement in symptoms. MRI (thoracic and lumbar spine) showed enhancing mass lesion in the conus medullaris and multiple small enhancing masses along the surface of the spinal cord in the upper and mid-thoracic spine. Findings felt to be compatible with metastatic lung cancer. MRI brain showed no mets. He was admitted with plan for 10 days of palliative radiation.  Patient has not been weighed since 12/03/18. RD last saw patient on 9/22 and since that time only meals on 9/23 and 9/24 have been documented. Per flow sheet, patient consumed 25% of all meals on 9/23 (total of494 kcal, 18 grams protein) and 100% of all meals on 9/24 (total of 2148 kcal, 89 grams protein). Per review of orders, patient has been accepting Ensure 80% of the time offered and prostat 75% of the time offered.  Patient is currently out of his room for radiation.    Per notes: - spinal mass (likely met) with resultant paraplegia and incontinence--receiving palliative radiation and today (9/28) is  6/10; staying inpatient to finish radiation course - neurogenic bladder - hyponatremia--thought to be from SIADH and 2/2 cancer - CHF--compensated and diuretics ordered - stage 2 pressure injuries--present on admission - back and neuropathic pain - constipation   Labs reviewed; Na: 129 mmol/l, Cl: 93 mmol/l, BUN: 28 mg/dl, Ca: 8.6 mg/dl.   Medications reviewed; 40 mg oral lasix/day, 1 packet miralax/day, 1 g oral NaCl tablet TID, 25 mg aldactone/day.     Diet Order:   Diet Order            Diet regular Room service appropriate? Yes; Fluid consistency: Thin  Diet effective now              EDUCATION NEEDS:   No education needs have been identified at this time  Skin:  Skin Assessment: Skin Integrity Issues: Skin Integrity Issues:: Stage II Stage II: R buttocks and sacrum  Last BM:  9/27  Height:   Ht Readings from Last 1 Encounters:  12/03/18 '5\' 11"'$  (1.803 m)    Weight:   Wt Readings from Last 1 Encounters:  12/03/18 83.8 kg    Ideal Body Weight:  78.2 kg  BMI:  Body mass index is 25.77 kg/m.  Estimated Nutritional Needs:   Kcal:  2300-2500 kcal  Protein:  115-125 grams  Fluid:  >/= 2.3 L/day     Jarome Matin, MS, RD, LDN, [facility] Inpatient Clinical Dietitian Pager # [phone] After hours/weekend pager # [phone]

## 2018-12-16 NOTE — TOC Transition Note (Signed)
Transition of Care [facility]) - CM/SW Discharge Note   Patient Details  Name: Benjamin [name] MRN: [id number] Date of Birth: 1946-03-05  Transition of Care [facility]) CM/SW Contact:  Dessa Phi, RN Phone Number: 12/16/2018, 10:36 AM   Clinical Narrative: Spoke to patient/[facility] rep Kerri/[facility]-Cassandra all agree to  D/c in am to [facility] w/hospice services from [facility]. DNR in shadow chart. Will call PTAR in am once d/c summary placed, & have rm,& tel# for nurse to call report.MD updated.      Final next level of care: Skilled Nursing Facility([facility]TC w/[facility] Services.) Barriers to Discharge: No Barriers Identified   Patient Goals and CMS Choice Patient states their goals for this hospitalization and ongoing recovery are:: go back to [facility] CMS Medicare.gov Compare Post Acute Care list provided to:: Patient    Discharge Placement              Patient chooses bed at: (Returning back to [facility]) Patient to be transferred to facility by: [place] Name of family member notified: Patient Patient and family notified of of transfer: 12/16/18  Discharge Plan and Services     Post Acute Care Choice: [place]                               Social Determinants of Health (SDOH) Interventions     Readmission Risk Interventions No flowsheet data found.

## 2018-12-16 NOTE — Progress Notes (Signed)
PROGRESS NOTE    Benjamin [name]  ZHY:[id number] DOB: [birth year] DOA: 11/26/2018 PCP: Hennie Duos, MD    Brief Narrative: 73 y.o.malewith PMH of spinal stenosis,mealnoma,non-small cell lung cancer and paroxysmal atrial fibrillation on Eliquis at home presented to the ED on9/8/2020with progressive weakness in the lower extremities since last May.Patient had severe spondylitic stenosis at L4-L5. About 4 months ago he underwent surgical decompression of the severe stenosis and he was sent off to rehab. Despite efforts at rehab his legs became progressively weaker with sensory changes as well as bowel and bladder incontinence.Patient was evaluated by NS, a myelogram was performed on 9/8, as the patient had become near-complete paraplegic, which revealed high-grade block at the thoracolumbar junction.Patient has history of lung cancer, he had radiation therapies and treatment at least 6 times for the cancer since 2003. MRI thoracic and lumbar spine showedenhancing mass lesion in the conus medullaris. Multiple small enhancing masses along the surface of the cord in the upper and midthoracic spine. 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 MRI brain showed no metastases to the brain or cerebral meninges.Patient on the direction of Dr Ellene Route, admitted to medical service with neurology,neurosurgeryand radiation oncology consultations and planned for 10 days of palliative radiation inpatient treatments due to difficulty with transportation.Oncology has been having discussions with patient about further plans/prognosis.Palliative care on board for discussion about goals of care. Patient had a family meeting with palliative care    Assessment & Plan:   Principal Problem:   Paraplegia ([place]) Active Problems:   Anxiety   Permanent atrial fibrillation   CAD (coronary artery disease)   Hyperlipidemia   3.2 cm bronchoalveolar carcinoma of lower lobe of  right lung (HCC)   Hyponatremia   Pressure injury of skin   Palliative care by specialist   Goals of care, counseling/discussion    1. Spinal mass with paraplegia/incontinence: Per Oncology, spinal mass is likely related to prior lung cancer but could be related to his prior history of melanomaversus another malignancy. Receiving palliative RT (s/p5/10 Rxs). Oncology discussed with patient regarding poor prognosis, low likelihood of restoring bladder function and lower extremity weakness etc. They discussed option of comfort care/hospice versus diagnostic LP or biopsy of spinal mass to confirm tumor diagnosis /consider systemic treatment options.Patient staying to finish the course of XRT prior to discharge as he has no way of transportation and the difficulties with transportation.  Patient to get his last XRT today and discharge tomorrow to [facility].  COVID 19 ORDERED results pending.    2.Neurogenic bladder: On indwelling Foley   3.Hyponatremia. 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  4.Chronic diastolic heart failure:Compensated at this time. Continue Lasix and Aldactone.  5.History of complete heart block status post pacemaker-Stable.  6.History of paroxysmal atrial fibrillation-Continue metoprolol and Eliquis. 7.Benign prostatic hypertrophy-Continue Flomax. On indwelling Foley for #2  8. Sacral and buttocks stage II pressure ulcer present on admission. Continue wound care   9. Back pain/neuropathic pain:Decadron diazepam has been stopped restarted Klonopin and gabapentin. Continue Remeron.A small dose of IV fentanyl has been started by palliative care.  10 constipation add stool softeners.  Pressure Injury 12/03/18 Buttocks Right Stage II -  Partial thickness loss of dermis presenting as a shallow open ulcer with a red, pink wound bed without slough. reddened circular pressure ulcer (Active)  12/03/18 1505  Location: Buttocks   Location Orientation: Right  Staging: Stage II -  Partial thickness loss of dermis presenting as a  shallow open ulcer with a red, pink wound bed without slough.  Wound Description (Comments): reddened circular pressure ulcer  Present on Admission: Yes     Pressure Injury 12/03/18 Sacrum Stage II -  Partial thickness loss of dermis presenting as a shallow open ulcer with a red, pink wound bed without slough. (Active)  12/03/18 1505  Location: Sacrum  Location Orientation:   Staging: Stage II -  Partial thickness loss of dermis presenting as a shallow open ulcer with a red, pink wound bed without slough.  Wound Description (Comments):   Present on Admission: Yes      Nutrition Problem: Increased nutrient needs Etiology: acute illness, chronic illness, cancer and cancer related treatments, wound healing     Signs/Symptoms: estimated needs    Interventions: Ensure Enlive (each supplement provides 350kcal and 20 grams of protein), Magic cup, MVI, Prostat  Estimated body mass index is 25.86 kg/m as calculated from the following:   Height as of this encounter: 5\' 11"  (1.803 m).   Weight as of this encounter: 84.1 kg.   Subjective: Patient resting in bed anxious and concerned about future  Objective: Vitals:   12/15/18 2349 12/16/18 0615 12/16/18 0813 12/16/18 1029  BP: 110/73 119/75  132/81  Pulse: 60 60  64  Resp: 16 20    Temp: (!) 97.5 F (36.4 C) 97.8 F (36.6 C)    TempSrc: Oral     SpO2: 96% 95% 96%   Weight:    84.1 kg  Height:        Intake/Output Summary (Last 24 hours) at 12/16/2018 1306 Last data filed at 12/15/2018 1847 Gross per 24 hour  Intake -  Output 950 ml  Net -950 ml   Filed Weights   12/03/18 1451 12/16/18 1029  Weight: 83.8 kg 84.1 kg    Examination:  General exam: Appears calm and comfortable  Respiratory system: Clear to auscultation. Respiratory effort normal. Cardiovascular system: S1 & S2 heard, RRR. No JVD, murmurs, rubs,  gallops or clicks. No pedal edema. Gastrointestinal system: Abdomen is nondistended, soft and nontender. No organomegaly or masses felt. Normal bowel sounds heard. Central nervous system: Alert and oriented. No focal neurological deficits. Extremities: 1+ pitting edema 0 x 5 power  Skin:sacral decub Psychiatry: Judgement and insight appear normal. Mood & affect appropriate.     Data Reviewed: I have personally reviewed following labs and imaging studies  CBC: Recent Labs  Lab 12/14/18 1039  WBC 8.3  HGB 14.1  HCT 43.0  MCV 89.4  PLT 703   Basic Metabolic Panel: Recent Labs  Lab 12/10/18 0444 12/11/18 0448 12/12/18 0512 12/14/18 1039  NA 127* 129* 128* 129*  K 4.7 4.8 4.4 4.5  CL 91* 96* 97* 93*  CO2 28 24 24 27   GLUCOSE 116* 133* 116* 157*  BUN 33* 34* 31* 28*  CREATININE 0.86 0.81 0.71 0.71  CALCIUM 8.8* 9.0 8.6* 8.6*   GFR: Estimated Creatinine Clearance: 85 mL/min (by C-G formula based on SCr of 0.71 mg/dL). Liver Function Tests: No results for input(s): AST, ALT, ALKPHOS, BILITOT, PROT, ALBUMIN in the last 168 hours. No results for input(s): LIPASE, AMYLASE in the last 168 hours. No results for input(s): AMMONIA in the last 168 hours. Coagulation Profile: No results for input(s): INR, PROTIME in the last 168 hours. Cardiac Enzymes: No results for input(s): CKTOTAL, CKMB, CKMBINDEX, TROPONINI in the last 168 hours. BNP (last 3 results) No results for input(s): PROBNP in the last 8760 hours. HbA1C: No  results for input(s): HGBA1C in the last 72 hours. CBG: No results for input(s): GLUCAP in the last 168 hours. Lipid Profile: No results for input(s): CHOL, HDL, LDLCALC, TRIG, CHOLHDL, LDLDIRECT in the last 72 hours. Thyroid Function Tests: No results for input(s): TSH, T4TOTAL, FREET4, T3FREE, THYROIDAB in the last 72 hours. Anemia Panel: No results for input(s): VITAMINB12, FOLATE, FERRITIN, TIBC, IRON, RETICCTPCT in the last 72 hours. Sepsis Labs: No  results for input(s): PROCALCITON, LATICACIDVEN in the last 168 hours.  No results found for this or any previous visit (from the past 240 hour(s)).       Radiology Studies: No results found.      Scheduled Meds: . acetaminophen  1,000 mg Oral TID  . apixaban  5 mg Oral BID  . atorvastatin  20 mg Oral Daily  . Chlorhexidine Gluconate Cloth  6 each Topical Daily  . clonazePAM  1 mg Oral TID  . feeding supplement (ENSURE ENLIVE)  237 mL Oral Q24H  . feeding supplement (PRO-STAT SUGAR FREE 64)  30 mL Oral TID BM  . furosemide  40 mg Oral Daily  . gabapentin  100 mg Oral BID  . ipratropium-albuterol  3 mL Nebulization BID  . lidocaine  1 patch Transdermal Q24H  . metoprolol succinate  25 mg Oral Daily  . polyethylene glycol  17 g Oral Daily  . sodium chloride  1 g Oral TID WC  . spironolactone  25 mg Oral Daily  . tamsulosin  0.4 mg Oral QPM   Continuous Infusions:   LOS: 20 days     Georgette Shell, MD [facility] If 7PM-7AM, please contact night-coverage [email or web address] Password [email or web address] 12/16/2018, 1:06 PM

## 2018-12-17 ENCOUNTER — Ambulatory Visit: Payer: Medicare Other | Admitting: Cardiovascular Disease

## 2018-12-17 ENCOUNTER — Inpatient Hospital Stay
Admission: RE | Admit: 2018-12-17 | Discharge: 2019-01-19 | Disposition: E | Payer: Medicare Other | Source: Ambulatory Visit | Attending: Internal Medicine | Admitting: Internal Medicine

## 2018-12-17 ENCOUNTER — Other Ambulatory Visit: Payer: Self-pay | Admitting: Adult Health

## 2018-12-17 LAB — NOVEL CORONAVIRUS, NAA (HOSP ORDER, SEND-OUT TO REF LAB; TAT 18-24 HRS): SARS-CoV-2, NAA: NOT DETECTED

## 2018-12-17 MED ORDER — CLONAZEPAM 1 MG PO TABS: 1.0000 mg | ORAL_TABLET | Freq: Three times a day (TID) | ORAL | 0 refills | Status: DC

## 2018-12-17 MED ORDER — OXYCODONE HCL 5 MG PO TABS: 5.0000 mg | ORAL_TABLET | ORAL | 0 refills | Status: DC | PRN

## 2018-12-17 NOTE — TOC Transition Note (Signed)
Transition of Care [facility]) - CM/SW Discharge Note   Patient Details  Name: Benjamin [name] MRN: [id number] Date of Birth: [birth year]  Transition of Care [facility]) CM/SW Contact:  Dessa Phi, RN Phone Number: 11/24/2018, 11:14 AM   Clinical Narrative:   D/c today back to [facility] w/[facility] services to start tomorrow per Cassandra rep. PTAR called for 1p pick up. Nsg aware. Awaiting rm/bed# & tel# for nurse to cal report.No further CM needs    Final next level of care: [place] Barriers to Discharge: No Barriers Identified   Patient Goals and CMS Choice Patient states their goals for this hospitalization and ongoing recovery are:: [facility] Medicare.gov Compare Post Acute Care list provided to:: Patient    Discharge Placement              Patient chooses bed at: (Returning back to [facility]) Patient to be transferred to facility by: [place] Name of family member notified: Patient Patient and family notified of of transfer: 12/05/2018  Discharge Plan and Services     Post Acute Care Choice: [place]                               Social Determinants of Health (SDOH) Interventions     Readmission Risk Interventions No flowsheet data found.

## 2018-12-17 NOTE — TOC Progression Note (Signed)
Transition of Care [facility]) - Progression Note    Patient Details  Name: Benjamin [name] MRN: [id number] Date of Birth: 1945-05-25  Transition of Care [facility]) CM/SW Contact  Mersades Barbaro, Juliann Pulse, RN Phone Number: 12/14/2018, 12:22 PM  Clinical Narrative: Nurse to call report to [phone], rm#[id number]      Expected Discharge Plan: [place] Barriers to Discharge: No Barriers Identified  Expected Discharge Plan and Services Expected Discharge Plan: [place] Choice: [place] Living arrangements for the past 2 months: [place] Expected Discharge Date: 12/01/2018                                     Social Determinants of Health (SDOH) Interventions    Readmission Risk Interventions No flowsheet data found.

## 2018-12-17 NOTE — Discharge Summary (Signed)
Physician Discharge Summary  Benjamin [name] VEL:[id number] DOB: Nov 17, 1945 DOA: 11/26/2018  PCP: Hennie Duos, MD  Admit date: 11/26/2018 Discharge date: 11/19/2018  Admitted From: Nursing Home Disposition: Nursing home Recommendations for Outpatient Follow-up-[facility] to low patient at the facility and patient to be followed by the facility Dr.:  Home Health: None Equipment/Devices: None Discharge Condition hospice discharge CODE STATUS DO NOT RESUSCITATE Diet recommendation: Regular diet   brief/Interim Summary:73 y.o.malewith PMH of spinal stenosis,mealnoma,non-small cell lung cancer and paroxysmal atrial fibrillation on Eliquis at home presented to the ED on9/8/2020with progressive weakness in the lower extremities since last May.Patient had severe spondylitic stenosis at L4-L5. About 4 months ago he underwent surgical decompression of the severe stenosis and he was sent off to rehab. Despite efforts at rehab his legs became progressively weaker with sensory changes as well as bowel and bladder incontinence.Patient was evaluated by NS, a myelogram was performed on 9/8, as the patient had become near-complete paraplegic, which revealed high-grade block at the thoracolumbar junction.Patient has history of lung cancer, he had radiation therapies and treatment at least 6 times for the cancer since 2003. MRI thoracic and lumbar spine showedenhancing mass lesion in the conus medullaris. Multiple small enhancing masses along the surface of the cord in the upper and midthoracic spine. 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 MRI brain showed no metastases to the brain or cerebral meninges.Patient on the direction of Dr Ellene Route, admitted to medical service with neurology,neurosurgeryand radiation oncology consultations and planned for 10 days of palliative radiation inpatient treatments due to difficulty with transportation.Oncology has been  having discussions with patient about further plans/prognosis.Palliative care on board for discussion about goals of care. 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 Spinal mass with paraplegia/incontinence: Per oncology spinal mass is thought to be secondary to prior lung cancer but could be related to his prior history of melanoma versus other malignancy.  Patient received XRT 10 treatments during this hospital stay without any improvement in his function of the lower extremities.  He continues to have bowel and bladder incontinence.  He has a chronic Foley in place.  He needs ongoing Foley care.  Patient has agreed for [facility] to follow him at the facility.   COVID 19 negative  2.Neurogenic bladder: On indwelling Foley   3.Hyponatremia. 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  4.Chronic diastolic heart failure:Compensated at this time. Continue Lasix and Aldactone.  5.History of complete heart block status post pacemaker-Stable.  6.History of paroxysmal atrial fibrillation-Continue metoprolol and Eliquis. 7.Benign prostatic hypertrophy-Continue Flomax.   8. Sacral and buttocks stage II pressure ulcer present on admission. Continue wound care   9. Back pain/neuropathic pain: Continue oxycodone.   10 constipation he needs stool softeners daily  Pressure Injury 12/03/18 Buttocks Right Stage II -  Partial thickness loss of dermis presenting as a shallow open ulcer with a red, pink wound bed without slough. reddened circular pressure ulcer (Active)  12/03/18 1505  Location:  Buttocks  Location Orientation: Right  Staging: Stage II -  Partial thickness loss of dermis presenting as  a shallow open ulcer with a red, pink wound bed without slough.  Wound Description (Comments): reddened circular pressure ulcer  Present on Admission: Yes     Pressure Injury 12/03/18 Sacrum Stage II -  Partial thickness loss of dermis presenting as a shallow open ulcer with a red, pink wound bed without slough. (Active)  12/03/18 1505  Location: Sacrum  Location Orientation:   Staging: Stage II -  Partial thickness loss of dermis presenting as a shallow open ulcer with a red, pink wound bed without slough.  Wound Description (Comments):   Present on Admission: Yes      Nutrition Problem: Increased nutrient needs Etiology: acute illness, chronic illness, cancer and cancer related treatments, wound healing    Signs/Symptoms: estimated needs     Interventions: Ensure Enlive (each supplement provides 350kcal and 20 grams of protein), Magic cup, MVI, Prostat  Estimated body mass index is 25.86 kg/m as calculated from the following:   Height as of this encounter: 5\' 11"  (1.803 m).   Weight as of this encounter: 84.1 kg.  Discharge Instructions  Discharge Instructions    Diet - low sodium heart healthy   Complete by: As directed    Increase activity slowly   Complete by: As directed      Allergies as of 11/19/2018      Reactions   Codeine Other (See Comments)   Dizziness, stomach pain- "Allergic," per [facility]      Medication List    TAKE these medications   acetaminophen 325 MG tablet Commonly known as: TYLENOL Take 2 tablets (650 mg total) by mouth every 6 (six) hours as needed for mild pain (or temp > 100).   ALLEVYN AG GENTLE BORDER EX Apply topically See admin instructions. Apply to bilateral buttocks after cleansing with normal saline   apixaban 5 MG Tabs tablet Commonly known as: ELIQUIS Take 5 mg by mouth 2 (two) times daily.   atorvastatin 20 MG  tablet Commonly known as: LIPITOR Take 20 mg by mouth at bedtime.   clonazePAM 1 MG tablet Commonly known as: KLONOPIN Take 1 tablet (1 mg total) by mouth 3 (three) times daily.   furosemide 40 MG tablet Commonly known as: LASIX Take 40 mg by mouth daily.   gabapentin 100 MG capsule Commonly known as: NEURONTIN Take 100 mg by mouth at bedtime.   ipratropium-albuterol 0.5-2.5 (3) MG/3ML Soln Commonly known as: DUONEB Take 3 mLs by nebulization 2 (two) times daily.   magnesium hydroxide 400 MG/5ML suspension Commonly known as: MILK OF MAGNESIA Take 30 mLs by mouth daily as needed for mild constipation.   metoprolol succinate 25 MG 24 hr tablet Commonly known as: TOPROL-XL Take 25 mg by mouth daily.   nitroGLYCERIN 0.4 MG SL tablet Commonly known as: NITROSTAT Place 1 tablet (0.4 mg total) under the tongue every 5 (five) minutes x 3 doses as needed for chest pain.   NON FORMULARY See admin instructions. ACE Wraps to BLE d/t increased edema- On during the day off at HS   NON FORMULARY Diet Type: Regular,  NAS, Consistent Carbohydrate   polyethylene glycol 17 g packet Commonly known as: MIRALAX / GLYCOLAX Take 17 g by mouth daily.   spironolactone 25 MG tablet Commonly known as: ALDACTONE Take 25 mg by mouth daily.   tamsulosin 0.4 MG Caps capsule Commonly known as: FLOMAX Take 0.4 mg by mouth every evening.   Venelex Oint Apply 1 application topically See admin instructions. Apply to coccyx /sacrum and bilateral  buttocks every shift and as needed for blanchable erythema and protection      Follow-up Information    Hennie Duos, MD Follow up.   Specialty: Internal Medicine Contact information: [place] [address] [phone]        Nahser, Benjamin Cheng, MD .   Specialty: Cardiology Contact information: [place] [address] [phone]        Benjamin Lance, MD .   Specialty: Cardiology Contact  information: [phone] [place] [address] [phone]          Allergies  Allergen Reactions  . Codeine Other (See Comments)    Dizziness, stomach pain- "Allergic," per [facility]    Consultations: Oncology and palliative care  Procedures/Studies: Ct Chest W Contrast  Result Date: 12/02/2018 CLINICAL DATA:  Restaging lung cancer. EXAM: CT CHEST, ABDOMEN, AND PELVIS WITH CONTRAST TECHNIQUE: Multidetector CT imaging of the chest, abdomen and pelvis was performed following the standard protocol during bolus administration of intravenous contrast. CONTRAST:  182mL OMNIPAQUE IOHEXOL 300 MG/ML  SOLN COMPARISON:  None. CT chest 07/10/2018 and CT AP from 05/24/2018. FINDINGS: CT CHEST FINDINGS Cardiovascular: Normal heart size. No pericardial effusion identified. Aortic atherosclerosis. Three vessel coronary artery atherosclerotic calcifications. Left chest wall pacer device is noted with lead in the right atrial appendage and coronary sinus. Mediastinum/Nodes: Normal appearance of the thyroid gland. The trachea appears patent and is midline. Normal appearance of the esophagus. No mediastinal or hilar adenopathy. No axillary or supraclavicular adenopathy. Lungs/Pleura: No pleural effusion identified. Status post left upper lobectomy. Masslike architectural distortion within the perihilar left upper lobe adjacent to the suture line with associated loculated pleural thickening and fluid is unchanged from the previous exam. Within the superior segment of the right lower lobe there is a part solid nodule measuring 2.9 cm, image 53/4. Previously 3.3 cm. Adjacent architectural distortion and pleural thickening is similar. Postsurgical changes within the medial aspect of the right upper lung with suture line is unchanged. Purely ground-glass attenuating nodule within the anterior right middle lobe measures 1.0 cm, image 74/4. Unchanged. Part solid nodule within the anterolateral right lower  lobe measures 5 cm, image 91/4. Stable from previous study. Calcified granuloma identified in the subpleural, posterior right lower lobe. No new pulmonary nodule or mass identified bilaterally. Musculoskeletal: Thoracic spondylosis. Unchanged displaced right lateral fifth rib deformity, image 33/3. No acute or suspicious osseous abnormality. CT ABDOMEN PELVIS FINDINGS Hepatobiliary: No focal liver abnormality is seen. No gallstones, gallbladder wall thickening, or biliary dilatation. Pancreas: Unremarkable. No pancreatic ductal dilatation or surrounding inflammatory changes. Spleen: Normal in size without focal abnormality. Adrenals/Urinary Tract: Status post right adrenalectomy. Normal left adrenal gland. The kidneys are unremarkable. Thick walled urinary bladder is collapsed around a Foley catheter. Stomach/Bowel: Stomach is within normal limits. Appendix appears normal. No evidence of bowel wall thickening, distention, or inflammatory changes. Vascular/Lymphatic: Aortic atherosclerosis. No aneurysm identified. No pelvic or inguinal adenopathy. Reproductive: Prostate gland enlargement. Other: No free fluid or fluid collections. Musculoskeletal: No acute or suspicious osseous abnormality. Lumbar degenerative disc disease. There are dysplastic changes involving the right femoral head with marked secondary degenerative changes. Moderate degenerative changes involve the left hip. IMPRESSION: 1. Stable exam. Similar appearance of post treatment changes within bilateral lungs without evidence for disease progression/recurrence. 2. Stable appearance of part solid nodules within the right lung. 3. Pure ground-glass attenuating nodule within the anterior right upper lobe is also unchanged. 4. Aortic  Atherosclerosis (ICD10-I70.0) and Emphysema (ICD10-J43.9). 5. Coronary artery atherosclerotic calcifications. Electronically Signed   By: Kerby Moors M.D.   On: 12/02/2018 14:41   Ct Thoracic Spine W Contrast  Result  Date: 11/26/2018 CLINICAL DATA:  73 year old male with grade 2 lower lumbar spondylolisthesis status post posterior decompression of the lumbar spine in May this year. Evidence of solid ankylosis at the L4-L5 spondylolisthesis level, however worsening weakness and neurologic symptoms since the time of surgery. History of lung cancer with multiple recurrences. FLUOROSCOPY TIME:  1 minutes 12 seconds PROCEDURE: LUMBAR PUNCTURE FOR THORACIC AND LUMBAR MYELOGRAM Lumbar puncture and intrathecal contrast administration were performed by Dr. Kristeen Miss who will separately report for the portion of the procedure. I personally supervised acquisition of the myelogram images. COMPARISON:  Postoperative lumbar spine CT 09/26/2018. Preoperative CT 07/10/2018. chest CT 07/10/2018. TECHNIQUE: Contiguous axial images were obtained through the Thoracic and Lumbar spine after the intrathecal infusion of infusion. Coronal and sagittal reconstructions were obtained of the axial image sets. FINDINGS: MYELOGRAM FINDINGS: Lumbar puncture was performed at the upper lumbar level. On the initial postcontrast images there is 5-6 millimeter round filling defect occupying the left half of the thecal sac at the L3 vertebral body level. This became more difficult to see over the course of the myelographic images and will be reported in the lumbar CT below. Stable spondylolisthesis of L4 on L5. Stable vertebral height and alignment elsewhere including mild retrolisthesis of L1 on L2. Good lumbar intrathecal sac opacification, with no lumbar spinal stenosis from L2 to the sacrum. There is a defect on the ventral thecal sac at L1-L2 related to chronic disc osteophyte complex, appearing similar to that expected based on the July CT. A 2nd lateral view in mild Trendelenburg demonstrates contrast flow beyond this level, and normal appearing thecal sac patency at T12-L1. See additional post myelogram CT findings of the thoracic spine below. CT THORACIC  MYELOGRAM FINDINGS: Decision was made following the acquisition of plain lumbar myelogram images to obtain CT post myelogram images also of the thoracic spine. Thoracic spine images were then obtained in a delayed fashion at 1241 hours (versus CT acquisition at 1133 hours of the lumbar spine. Only trace myelographic contrast is present in the thoracic spine, and seems to be mostly subdural space contrast. However, on axial series 7 images 68 through 110 there does appear to be a small amount of intrathecal thoracic contrast delineating a nonenlarged thoracic spinal cord from the T7 through the T10 level. Additionally the conus from T11-T12 continues to have an unusual post myelographic appearance which seems unlikely just to the mixed contrast injection (over an hour delayed from the lumbar images). And furthermore there remains a nodular filling defect in the right thecal sac at T12 (in addition to the multifocal cauda equina nodularity described below). There is no evidence of degenerative thoracic spinal stenosis. No acute or suspicious osseous lesion is identified in the thoracic spine. Persistent masslike opacity at the left hilum and in the visible posterior left upper lung pleural space. Partially visible persistent right upper lobe curvilinear confluent opacity on series 6, image 40. No new or increased mediastinal lymph nodes are identified. CT LUMBAR MYELOGRAM FINDINGS: Stable visible abdominal viscera including Calcified aortic atherosclerosis. And right adrenal region surgical clips. Stable posterior paraspinal soft tissues. No acute osseous abnormality identified. Stable posterior postoperative changes at L3-L4 and L4-L5. Solid interbody ankylosis redemonstrated at L4-L5. Intact visible sacrum and SI joints. T12-L1: The tip of the conus is at  this level, above which there is only trace subdural intraspinal contrast. A filling defect in the right thecal sac on series 5, image 41 was initially felt  probably related to the mixed injection surrounding the exiting right T12 nerve - although that seems less likely given the persistence on the delayed thoracic CT described above. No spinal stenosis at this level, minor disc bulging and endplate spurring appear stable since July. L1-L2: Chronic mild retrolisthesis with disc space loss and circumferential disc osteophyte complex. Mild spinal stenosis and mild to moderate left lateral recess stenosis (series 5, image 57). Moderate bilateral L1 foraminal stenosis. This level appears stable since July. L2-L3: Circumferential disc bulge and mild to moderate posterior element hypertrophy. No spinal stenosis. Borderline to mild left lateral recess stenosis. Mild to moderate left and borderline to mild right L2 foraminal stenosis. L3: Posterior to the L3 vertebral body to the left of midline there is a small 4-5 millimeter low-density filling defect associated with the cauda equina (series 5, image 80 and sagittal series 8, image 35), around which there is mildly hyperdense contrast, which appears similar to the epidural/subdural contrast also in this region (see bilateral exiting L3 nerve roots). Just cephalad of this lesion and near the midline there is a subtle 2 millimeter area of thickening of the cauda equina (series 5, image 76). L3-L4: Posterior decompression of this level. Circumferential disc osteophyte complex and residual posterior element hypertrophy with widely patent thecal sac. Borderline to mild bilateral L3 foraminal stenosis. L4-L5: Chronic grade 2 spondylolisthesis with interbody ankylosis and posterior decompression. There is mildly irregular thickening of multiple cauda equina nerve roots also at this level (series 5, image 95) most of which are not displaced outwardly along the walls of the thecal sac. No spinal stenosis. No lateral recess stenosis. Moderate to severe bilateral L4 foraminal stenosis which is probably greater on the right. L5-S1:  Mild  disc bulge and endplate spurring without stenosis. IMPRESSION: 1. 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 Mild chronic appearing lumbar spinal stenosis at L1-L2 is likely stable since that time, with decompressed L3-L4 and L4-L5 levels and no other significant lumbar spinal stenosis. 2. Non-specific thickening and nodularity of the cauda equina at the T12, L3, and L4 levels, with a superimposed unusual and bulbous appearance of the conus medullaris, which though initially felt related to a mixed contrast injection at the time of lumbar CT images (1133 hours) remained unchanged on delayed thoracic spine images at 1241 hours. And with no interval cephalad migration of intrathecal contrast, despite absent CT evidence of any thoracic spinal stenosis. 3. This constellation of clinical and imaging findings therefore raises the possibility of intrathecal metastatic disease with involvement of the conus and cauda equina. These studies were discussed by telephone with Dr. Kristeen Miss on 11/26/2018 at 13:20. Electronically Signed   By: Benjamin [name] M.D.   On: 11/26/2018 13:25   Ct Lumbar Spine W Contrast  Result Date: 11/26/2018 CLINICAL DATA:  74 year old male with grade 2 lower lumbar spondylolisthesis status post posterior decompression of the lumbar spine in May this year. Evidence of solid ankylosis at the L4-L5 spondylolisthesis level, however worsening weakness and neurologic symptoms since the time of surgery. History of lung cancer with multiple recurrences. FLUOROSCOPY TIME:  1 minutes 12 seconds PROCEDURE: LUMBAR PUNCTURE FOR THORACIC AND LUMBAR MYELOGRAM Lumbar puncture and intrathecal contrast administration were performed by Dr. Kristeen Miss who will separately report for the  portion of the procedure. I personally supervised acquisition of the myelogram images. COMPARISON:  Postoperative lumbar spine CT 09/26/2018. Preoperative CT  07/10/2018. chest CT 07/10/2018. TECHNIQUE: Contiguous axial images were obtained through the Thoracic and Lumbar spine after the intrathecal infusion of infusion. Coronal and sagittal reconstructions were obtained of the axial image sets. FINDINGS: MYELOGRAM FINDINGS: Lumbar puncture was performed at the upper lumbar level. On the initial postcontrast images there is 5-6 millimeter round filling defect occupying the left half of the thecal sac at the L3 vertebral body level. This became more difficult to see over the course of the myelographic images and will be reported in the lumbar CT below. Stable spondylolisthesis of L4 on L5. Stable vertebral height and alignment elsewhere including mild retrolisthesis of L1 on L2. Good lumbar intrathecal sac opacification, with no lumbar spinal stenosis from L2 to the sacrum. There is a defect on the ventral thecal sac at L1-L2 related to chronic disc osteophyte complex, appearing similar to that expected based on the July CT. A 2nd lateral view in mild Trendelenburg demonstrates contrast flow beyond this level, and normal appearing thecal sac patency at T12-L1. See additional post myelogram CT findings of the thoracic spine below. CT THORACIC MYELOGRAM FINDINGS: Decision was made following the acquisition of plain lumbar myelogram images to obtain CT post myelogram images also of the thoracic spine. Thoracic spine images were then obtained in a delayed fashion at 1241 hours (versus CT acquisition at 1133 hours of the lumbar spine. Only trace myelographic contrast is present in the thoracic spine, and seems to be mostly subdural space contrast. However, on axial series 7 images 68 through 110 there does appear to be a small amount of intrathecal thoracic contrast delineating a nonenlarged thoracic spinal cord from the T7 through the T10 level. Additionally the conus from T11-T12 continues to have an unusual post myelographic appearance which seems unlikely just to the mixed  contrast injection (over an hour delayed from the lumbar images). And furthermore there remains a nodular filling defect in the right thecal sac at T12 (in addition to the multifocal cauda equina nodularity described below). There is no evidence of degenerative thoracic spinal stenosis. No acute or suspicious osseous lesion is identified in the thoracic spine. Persistent masslike opacity at the left hilum and in the visible posterior left upper lung pleural space. Partially visible persistent right upper lobe curvilinear confluent opacity on series 6, image 40. No new or increased mediastinal lymph nodes are identified. CT LUMBAR MYELOGRAM FINDINGS: Stable visible abdominal viscera including Calcified aortic atherosclerosis. And right adrenal region surgical clips. Stable posterior paraspinal soft tissues. No acute osseous abnormality identified. Stable posterior postoperative changes at L3-L4 and L4-L5. Solid interbody ankylosis redemonstrated at L4-L5. Intact visible sacrum and SI joints. T12-L1: The tip of the conus is at this level, above which there is only trace subdural intraspinal contrast. A filling defect in the right thecal sac on series 5, image 41 was initially felt probably related to the mixed injection surrounding the exiting right T12 nerve - although that seems less likely given the persistence on the delayed thoracic CT described above. No spinal stenosis at this level, minor disc bulging and endplate spurring appear stable since July. L1-L2: Chronic mild retrolisthesis with disc space loss and circumferential disc osteophyte complex. Mild spinal stenosis and mild to moderate left lateral recess stenosis (series 5, image 57). Moderate bilateral L1 foraminal stenosis. This level appears stable since July. L2-L3: Circumferential disc bulge and mild to moderate  posterior element hypertrophy. No spinal stenosis. Borderline to mild left lateral recess stenosis. Mild to moderate left and borderline to  mild right L2 foraminal stenosis. L3: Posterior to the L3 vertebral body to the left of midline there is a small 4-5 millimeter low-density filling defect associated with the cauda equina (series 5, image 80 and sagittal series 8, image 35), around which there is mildly hyperdense contrast, which appears similar to the epidural/subdural contrast also in this region (see bilateral exiting L3 nerve roots). Just cephalad of this lesion and near the midline there is a subtle 2 millimeter area of thickening of the cauda equina (series 5, image 76). L3-L4: Posterior decompression of this level. Circumferential disc osteophyte complex and residual posterior element hypertrophy with widely patent thecal sac. Borderline to mild bilateral L3 foraminal stenosis. L4-L5: Chronic grade 2 spondylolisthesis with interbody ankylosis and posterior decompression. There is mildly irregular thickening of multiple cauda equina nerve roots also at this level (series 5, image 95) most of which are not displaced outwardly along the walls of the thecal sac. No spinal stenosis. No lateral recess stenosis. Moderate to severe bilateral L4 foraminal stenosis which is probably greater on the right. L5-S1:  Mild disc bulge and endplate spurring without stenosis. IMPRESSION: 1. 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 Mild chronic appearing lumbar spinal stenosis at L1-L2 is likely stable since that time, with decompressed L3-L4 and L4-L5 levels and no other significant lumbar spinal stenosis. 2. Non-specific thickening and nodularity of the cauda equina at the T12, L3, and L4 levels, with a superimposed unusual and bulbous appearance of the conus medullaris, which though initially felt related to a mixed contrast injection at the time of lumbar CT images (1133 hours) remained unchanged on delayed thoracic spine images at 1241 hours. And with no interval cephalad  migration of intrathecal contrast, despite absent CT evidence of any thoracic spinal stenosis. 3. This constellation of clinical and imaging findings therefore raises the possibility of intrathecal metastatic disease with involvement of the conus and cauda equina. These studies were discussed by telephone with Dr. Kristeen Miss on 11/26/2018 at 13:20. Electronically Signed   By: Benjamin [name] M.D.   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 Brain staging. The patient has an MRI compatible cardiac pacemaker. EXAM: MRI HEAD WITHOUT AND WITH CONTRAST TECHNIQUE: Multiplanar, multiecho pulse sequences of the brain and surrounding structures were obtained without and with intravenous contrast. CONTRAST:  72mL GADAVIST GADOBUTROL 1 MMOL/ML IV SOLN COMPARISON:  Thoracic and lumbar MRI yesterday. FINDINGS: Brain: No restricted diffusion to suggest acute infarction. No midline shift, mass effect, ventriculomegaly, extra-axial collection or acute intracranial hemorrhage. Cervicomedullary junction and pituitary are within normal limits. Prior to contrast gray and white matter signal is largely normal for age throughout the brain. 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 But otherwise only minimal nonspecific white matter T2 and FLAIR hyperintensity. Following contrast there is no abnormal intracranial enhancement identified. No leptomeningeal or dural thickening identified inside the skull. Also the visible cervical spinal cord to C3-C4 seems to remain normal. Vascular: The distal right vertebral artery flow void is lost on series 10, image 3. The left vertebral appears mildly dominant. The other Major intracranial vascular flow voids are  preserved. The major dural venous sinuses are  enhancing and appear to be patent. Skull and upper cervical spine: Visualized bone marrow signal is within normal limits. C3-C4 disc and endplate degeneration. Sinuses/Orbits: Negative orbits. Trace paranasal sinus mucosal thickening. Other: Mastoids are clear. Visible internal auditory structures appear normal. Right lateral convexity scalp lipoma incidentally noted on series 17, image 14. However, there is a nonspecific 12 millimeter heterogeneously enhancing scalp lesion located near the right temple on series 19, image 18 (note abnormal diffusion on series 7, image 56). IMPRESSION: 1. No metastatic disease to the brain or cerebral meninges is identified. 2. Possible 12 mm scalp soft tissue metastasis near the right temple (series 19, image 18). Alternatively this might be an inflamed proteinaceous dermal lesion. 3. No metastatic disease identified in the visible upper cervical spine. Electronically Signed   By: Benjamin [name] M.D.   On: 11/29/2018 16:05   Benjamin Thoracic Spine W Wo Contrast  Result Date: 11/28/2018 CLINICAL DATA:  Bilateral leg weakness. Abnormal CT myelogram 11/26/2018. History of lung cancer. The patient has a pacemaker which is MRI compatible. EXAM: MRI THORACIC WITHOUT AND WITH CONTRAST TECHNIQUE: Multiplanar and multiecho pulse sequences of the thoracic spine were obtained without and with intravenous contrast. CONTRAST:  59mL GADAVIST GADOBUTROL 1 MMOL/ML IV SOLN COMPARISON:  CT myelogram 11/26/2018 FINDINGS: MRI THORACIC SPINE FINDINGS Alignment:  Normal Vertebrae: Fatty bone marrow changes are present T3 through T7 compatible prior chest radiation for left-sided lung cancer. No thoracic fracture or metastatic disease to the spine. Cord: Expansile mass lesion in the conus medullaris at the T11 and T12 level. The mass measures 14 x 19 x 37 mm and appears intramedullary. There is significant cord edema above the mass. Cord edema continues into the mid and upper thoracic cord. Postcontrast images  reveal multiple enhancing masses along the surface of the cord at T1, T2, T3, T5, and T6. Likely several other smaller lesions are also present. Enhancement extends into the left T3 neural foramina. Paraspinal and other soft tissues: Left upper lobe mass. No significant pleural effusion. Right upper lobe abnormality possibly metastatic disease. Disc levels: 2 mm anterolisthesis C7-T1 due to facet degeneration. Mild thoracic disc degeneration. Negative for disc protrusion. IMPRESSION: Enhancing mass lesion in the conus medullaris. Multiple small enhancing masses along the surface of the cord in the upper and midthoracic spine. 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 Recommend MRI of the brain without with contrast evaluate for metastatic disease. No evidence of bony metastatic disease These results were called by telephone at the time of interpretation on 11/28/2018 at 2:35 pm to provider [facility] , who verbally acknowledged these results. Electronically Signed   By: Benjamin [name] M.D.   On: 11/28/2018 14:36   Benjamin Lumbar Spine W Wo Contrast  Result Date: 11/28/2018 CLINICAL DATA:  Back pain. 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 History of lung cancer. Lumbar laminectomy 07/25/2018 L4-5 level. Abnormal myelogram 11/26/2018 EXAM: MRI LUMBAR SPINE WITHOUT AND WITH CONTRAST TECHNIQUE: Multiplanar and multiecho pulse sequences of the lumbar spine were obtained without and with intravenous contrast. CONTRAST:  67mL GADAVIST GADOBUTROL 1 MMOL/ML IV SOLN COMPARISON:  Thoracic and lumbar myelogram 11/26/2018 FINDINGS: Segmentation:  Normal Alignment:  Mild retrolisthesis L1-2, 4 mm. 16 mm anterolisthesis L4-5 with severe disc degeneration at this level and bilateral pars defects of L4 Vertebrae: Negative for vertebral fracture or mass lesion. Small Schmorl's node with surrounding bone marrow edema at L1-2. Conus medullaris and cauda  equina: Conus  medullaris appears to terminate at T12-L1. There is and expansile mass lesion in the distal spinal cord and conus medullaris which shows heterogeneous signal intensity and enhancement. The mass measures approximately 14 x 9 mm on transverse images and extends over a 37 mm length. Small areas of hemorrhage are present within the mass. This accounts for the intradural mass identified on myelogram. There is edema in the cord above the mass. Paraspinal and other soft tissues: Negative for retroperitoneal mass or adenopathy. Urinary bladder is distended with Foley catheter. Disc levels: T12-L1: Mild facet degeneration.  Negative for stenosis L1-2: 4 mm retrolisthesis with disc and facet degeneration. Mild spinal stenosis and mild subarticular stenosis bilaterally L2-3: Diffuse bulging of the disc. Shallow foraminal disc protrusion bilaterally. Mild facet degeneration. Mild spinal stenosis and mild subarticular stenosis bilaterally L3-4: Diffuse disc bulging and mild facet degeneration. Negative for stenosis L4-5: 16 mm anterolisthesis with bilateral pars defects. Moderate to severe foraminal encroachment bilaterally with impingement of the L4 nerve root bilaterally. L5-S1: Small central disc protrusion. Mild flattening of the right S1 nerve root. IMPRESSION: Enhancing mass involving the distal spinal cord and conus medullaris. This caused myelographic near complete block 2 days ago. 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 Review of the thoracic MRI demonstrates multiple enhancing masses along the surface of the cord in the thoracic spine, therefore metastatic disease is felt to be most likely. Negative for bony metastatic disease lumbar spine Grade 2 slip L4-5 due to bilateral pars defects of L4 resulting and moderate to severe foraminal encroachment bilaterally. These results were called by telephone at the time of interpretation on 11/28/2018 at 2:26 pm to provider [facility] , who verbally  acknowledged these results. Electronically Signed   By: Benjamin [name] M.D.   On: 11/28/2018 14:28   Ct Abdomen Pelvis W Contrast  Result Date: 12/02/2018 CLINICAL DATA:  Restaging lung cancer. EXAM: CT CHEST, ABDOMEN, AND PELVIS WITH CONTRAST TECHNIQUE: Multidetector CT imaging of the chest, abdomen and pelvis was performed following the standard protocol during bolus administration of intravenous contrast. CONTRAST:  156mL OMNIPAQUE IOHEXOL 300 MG/ML  SOLN COMPARISON:  None. CT chest 07/10/2018 and CT AP from 05/24/2018. FINDINGS: CT CHEST FINDINGS Cardiovascular: Normal heart size. No pericardial effusion identified. Aortic atherosclerosis. Three vessel coronary artery atherosclerotic calcifications. Left chest wall pacer device is noted with lead in the right atrial appendage and coronary sinus. Mediastinum/Nodes: Normal appearance of the thyroid gland. The trachea appears patent and is midline. Normal appearance of the esophagus. No mediastinal or hilar adenopathy. No axillary or supraclavicular adenopathy. Lungs/Pleura: No pleural effusion identified. Status post left upper lobectomy. Masslike architectural distortion within the perihilar left upper lobe adjacent to the suture line with associated loculated pleural thickening and fluid is unchanged from the previous exam. Within the superior segment of the right lower lobe there is a part solid nodule measuring 2.9 cm, image 53/4. Previously 3.3 cm. Adjacent architectural distortion and pleural thickening is similar. Postsurgical changes within the medial aspect of the right upper lung with suture line is unchanged. Purely ground-glass attenuating nodule within the anterior right middle lobe measures 1.0 cm, image 74/4. Unchanged. Part solid nodule within the anterolateral right lower lobe measures 5 cm, image 91/4. Stable from previous study. Calcified granuloma identified in the subpleural, posterior right lower lobe. No new pulmonary nodule or mass  identified bilaterally. Musculoskeletal: Thoracic spondylosis. Unchanged displaced right lateral fifth rib deformity, image 33/3. No acute or suspicious osseous abnormality. CT ABDOMEN  PELVIS FINDINGS Hepatobiliary: No focal liver abnormality is seen. No gallstones, gallbladder wall thickening, or biliary dilatation. Pancreas: Unremarkable. No pancreatic ductal dilatation or surrounding inflammatory changes. Spleen: Normal in size without focal abnormality. Adrenals/Urinary Tract: Status post right adrenalectomy. Normal left adrenal gland. The kidneys are unremarkable. Thick walled urinary bladder is collapsed around a Foley catheter. Stomach/Bowel: Stomach is within normal limits. Appendix appears normal. No evidence of bowel wall thickening, distention, or inflammatory changes. Vascular/Lymphatic: Aortic atherosclerosis. No aneurysm identified. No pelvic or inguinal adenopathy. Reproductive: Prostate gland enlargement. Other: No free fluid or fluid collections. Musculoskeletal: No acute or suspicious osseous abnormality. Lumbar degenerative disc disease. There are dysplastic changes involving the right femoral head with marked secondary degenerative changes. Moderate degenerative changes involve the left hip. IMPRESSION: 1. Stable exam. Similar appearance of post treatment changes within bilateral lungs without evidence for disease progression/recurrence. 2. Stable appearance of part solid nodules within the right lung. 3. Pure ground-glass attenuating nodule within the anterior right upper lobe is also unchanged. 4. Aortic Atherosclerosis (ICD10-I70.0) and Emphysema (ICD10-J43.9). 5. Coronary artery atherosclerotic calcifications. Electronically Signed   By: Kerby Moors M.D.   On: 12/02/2018 14:41   Dg Myelogram Lumbar  Result Date: 11/26/2018 CLINICAL DATA:  73 year old male with grade 2 lower lumbar spondylolisthesis status post posterior decompression of the lumbar spine in May this year. Evidence of  solid ankylosis at the L4-L5 spondylolisthesis level, however worsening weakness and neurologic symptoms since the time of surgery. History of lung cancer with multiple recurrences. FLUOROSCOPY TIME:  1 minutes 12 seconds PROCEDURE: LUMBAR PUNCTURE FOR THORACIC AND LUMBAR MYELOGRAM Lumbar puncture and intrathecal contrast administration were performed by Dr. Kristeen Miss who will separately report for the portion of the procedure. I personally supervised acquisition of the myelogram images. COMPARISON:  Postoperative lumbar spine CT 09/26/2018. Preoperative CT 07/10/2018. chest CT 07/10/2018. TECHNIQUE: Contiguous axial images were obtained through the Thoracic and Lumbar spine after the intrathecal infusion of infusion. Coronal and sagittal reconstructions were obtained of the axial image sets. FINDINGS: MYELOGRAM FINDINGS: Lumbar puncture was performed at the upper lumbar level. On the initial postcontrast images there is 5-6 millimeter round filling defect occupying the left half of the thecal sac at the L3 vertebral body level. This became more difficult to see over the course of the myelographic images and will be reported in the lumbar CT below. Stable spondylolisthesis of L4 on L5. Stable vertebral height and alignment elsewhere including mild retrolisthesis of L1 on L2. Good lumbar intrathecal sac opacification, with no lumbar spinal stenosis from L2 to the sacrum. There is a defect on the ventral thecal sac at L1-L2 related to chronic disc osteophyte complex, appearing similar to that expected based on the July CT. A 2nd lateral view in mild Trendelenburg demonstrates contrast flow beyond this level, and normal appearing thecal sac patency at T12-L1. See additional post myelogram CT findings of the thoracic spine below. CT THORACIC MYELOGRAM FINDINGS: Decision was made following the acquisition of plain lumbar myelogram images to obtain CT post myelogram images also of the thoracic spine. Thoracic spine  images were then obtained in a delayed fashion at 1241 hours (versus CT acquisition at 1133 hours of the lumbar spine. Only trace myelographic contrast is present in the thoracic spine, and seems to be mostly subdural space contrast. However, on axial series 7 images 68 through 110 there does appear to be a small amount of intrathecal thoracic contrast delineating a nonenlarged thoracic spinal cord from the T7 through the  T10 level. Additionally the conus from T11-T12 continues to have an unusual post myelographic appearance which seems unlikely just to the mixed contrast injection (over an hour delayed from the lumbar images). And furthermore there remains a nodular filling defect in the right thecal sac at T12 (in addition to the multifocal cauda equina nodularity described below). There is no evidence of degenerative thoracic spinal stenosis. No acute or suspicious osseous lesion is identified in the thoracic spine. Persistent masslike opacity at the left hilum and in the visible posterior left upper lung pleural space. Partially visible persistent right upper lobe curvilinear confluent opacity on series 6, image 40. No new or increased mediastinal lymph nodes are identified. CT LUMBAR MYELOGRAM FINDINGS: Stable visible abdominal viscera including Calcified aortic atherosclerosis. And right adrenal region surgical clips. Stable posterior paraspinal soft tissues. No acute osseous abnormality identified. Stable posterior postoperative changes at L3-L4 and L4-L5. Solid interbody ankylosis redemonstrated at L4-L5. Intact visible sacrum and SI joints. T12-L1: The tip of the conus is at this level, above which there is only trace subdural intraspinal contrast. A filling defect in the right thecal sac on series 5, image 41 was initially felt probably related to the mixed injection surrounding the exiting right T12 nerve - although that seems less likely given the persistence on the delayed thoracic CT described above.  No spinal stenosis at this level, minor disc bulging and endplate spurring appear stable since July. L1-L2: Chronic mild retrolisthesis with disc space loss and circumferential disc osteophyte complex. Mild spinal stenosis and mild to moderate left lateral recess stenosis (series 5, image 57). Moderate bilateral L1 foraminal stenosis. This level appears stable since July. L2-L3: Circumferential disc bulge and mild to moderate posterior element hypertrophy. No spinal stenosis. Borderline to mild left lateral recess stenosis. Mild to moderate left and borderline to mild right L2 foraminal stenosis. L3: Posterior to the L3 vertebral body to the left of midline there is a small 4-5 millimeter low-density filling defect associated with the cauda equina (series 5, image 80 and sagittal series 8, image 35), around which there is mildly hyperdense contrast, which appears similar to the epidural/subdural contrast also in this region (see bilateral exiting L3 nerve roots). Just cephalad of this lesion and near the midline there is a subtle 2 millimeter area of thickening of the cauda equina (series 5, image 76). L3-L4: Posterior decompression of this level. Circumferential disc osteophyte complex and residual posterior element hypertrophy with widely patent thecal sac. Borderline to mild bilateral L3 foraminal stenosis. L4-L5: Chronic grade 2 spondylolisthesis with interbody ankylosis and posterior decompression. There is mildly irregular thickening of multiple cauda equina nerve roots also at this level (series 5, image 95) most of which are not displaced outwardly along the walls of the thecal sac. No spinal stenosis. No lateral recess stenosis. Moderate to severe bilateral L4 foraminal stenosis which is probably greater on the right. L5-S1:  Mild disc bulge and endplate spurring without stenosis. IMPRESSION: 1. No new osseous abnormality in the lumbar spine since July, with continued solid ankylosis of the L4-L5  spondylolisthesis. Mild chronic appearing lumbar spinal stenosis at L1-L2 is likely stable since that time, with decompressed L3-L4 and L4-L5 levels and no other significant lumbar spinal stenosis. 2. Non-specific thickening and nodularity of the cauda equina at the T12, L3, and L4 levels, with a superimposed unusual and bulbous appearance of the conus medullaris, which though initially felt related to a mixed contrast injection at the time of lumbar CT images (1133 hours)  remained unchanged on delayed thoracic spine images at 1241 hours. And with no interval cephalad migration of intrathecal contrast, despite absent CT evidence of any thoracic spinal stenosis. 3. This constellation of clinical and imaging findings therefore raises the possibility of intrathecal metastatic disease with involvement of the conus and cauda equina. These studies were discussed by telephone with Dr. Kristeen Miss on 11/26/2018 at 13:20. Electronically Signed   By: Benjamin [name] M.D.   On: 11/26/2018 13:25   Vas [place] Lower Extremity Venous (dvt)  Result Date: 11/28/2018  Lower Venous Study Indications: Edema.  Risk Factors: Cancer metastatic. Comparison Study: 05/17/24 bilateral lev negative Performing Technologist: Toma Copier RVS  Examination Guidelines: A complete evaluation includes B-mode imaging, spectral Doppler, color Doppler, and power Doppler as needed of all accessible portions of each vessel. Bilateral testing is considered an integral part of a complete examination. Limited examinations for reoccurring indications may be performed as noted.  +---------+---------------+---------+-----------+----------+-------------------+ RIGHT    CompressibilityPhasicitySpontaneityPropertiesThrombus Aging      +---------+---------------+---------+-----------+----------+-------------------+ CFV      Full           Yes      Yes                                       +---------+---------------+---------+-----------+----------+-------------------+ SFJ      Full                                                             +---------+---------------+---------+-----------+----------+-------------------+ FV Prox  Full           Yes      Yes                                      +---------+---------------+---------+-----------+----------+-------------------+ FV Mid   Full                                                             +---------+---------------+---------+-----------+----------+-------------------+ FV DistalFull           Yes      Yes                                      +---------+---------------+---------+-----------+----------+-------------------+ PFV      Full           Yes      Yes                                      +---------+---------------+---------+-----------+----------+-------------------+ POP                     Yes      Yes                  Patient unable to  bend the knee for                                                         compression         +---------+---------------+---------+-----------+----------+-------------------+ PTV      Full                                                             +---------+---------------+---------+-----------+----------+-------------------+ PERO     Full                                                             +---------+---------------+---------+-----------+----------+-------------------+   +---------+---------------+---------+-----------+----------+--------------+ LEFT     CompressibilityPhasicitySpontaneityPropertiesThrombus Aging +---------+---------------+---------+-----------+----------+--------------+ CFV      Full           Yes      Yes                                 +---------+---------------+---------+-----------+----------+--------------+ SFJ      Full                                                         +---------+---------------+---------+-----------+----------+--------------+ FV Prox  Full           Yes      Yes                                 +---------+---------------+---------+-----------+----------+--------------+ FV Mid   Full                                                        +---------+---------------+---------+-----------+----------+--------------+ FV DistalFull           Yes      Yes                                 +---------+---------------+---------+-----------+----------+--------------+ PFV      Full           Yes      Yes                                 +---------+---------------+---------+-----------+----------+--------------+ POP      Full           Yes      Yes                                 +---------+---------------+---------+-----------+----------+--------------+  PTV      Full                                                        +---------+---------------+---------+-----------+----------+--------------+ PERO     Full                                                        +---------+---------------+---------+-----------+----------+--------------+     Summary: Right: There is no evidence of deep vein thrombosis in the lower extremity. No cystic structure found in the popliteal fossa. Left: There is no evidence of deep vein thrombosis in the lower extremity. No cystic structure found in the popliteal fossa.  *See table(s) above for measurements and observations. Electronically signed by Monica Martinez MD on 11/28/2018 at 3:19:13 PM.    Final     (Echo, Carotid, EGD, Colonoscopy, ERCP)    Subjective: Patient is resting in bed anxious about discharge Discharge Exam: Vitals:   12/01/2018 0840 11/25/2018 1002  BP: 127/79   Pulse: 69   Resp:    Temp:    SpO2:  96%   Vitals:   12/16/18 2103 12/14/2018 0635 11/23/2018 0840 12/09/2018 1002  BP: 108/62 104/65 127/79   Pulse: 60 62 69   Resp:  18 14    Temp: 97.6 F (36.4 C) 97.6 F (36.4 C)    TempSrc: Axillary Oral    SpO2: 100% 94%  96%  Weight:      Height:        General: Pt is alert, awake, not in acute distress Cardiovascular: RRR, S1/S2 +, no rubs, no gallops Respiratory: CTA bilaterally, no wheezing, no rhonchi Abdominal: Soft, NT, ND, bowel sounds + Extremities 1+ pitting edema with no movements in the lower extremities with no sensation below the knees  The results of significant diagnostics from this hospitalization (including imaging, microbiology, ancillary and laboratory) are listed below for reference.     Microbiology: No results found for this or any previous visit (from the past 240 hour(s)).   Labs: BNP (last 3 results) Recent Labs    07/26/18 0218  BNP 623.7*   Basic Metabolic Panel: Recent Labs  Lab 12/11/18 0448 12/12/18 0512 12/14/18 1039  NA 129* 128* 129*  K 4.8 4.4 4.5  CL 96* 97* 93*  CO2 24 24 27   GLUCOSE 133* 116* 157*  BUN 34* 31* 28*  CREATININE 0.81 0.71 0.71  CALCIUM 9.0 8.6* 8.6*   Liver Function Tests: No results for input(s): AST, ALT, ALKPHOS, BILITOT, PROT, ALBUMIN in the last 168 hours. No results for input(s): LIPASE, AMYLASE in the last 168 hours. No results for input(s): AMMONIA in the last 168 hours. CBC: Recent Labs  Lab 12/14/18 1039  WBC 8.3  HGB 14.1  HCT 43.0  MCV 89.4  PLT 187   Cardiac Enzymes: No results for input(s): CKTOTAL, CKMB, CKMBINDEX, TROPONINI in the last 168 hours. BNP: Invalid input(s): POCBNP CBG: No results for input(s): GLUCAP in the last 168 hours. D-Dimer No results for input(s): DDIMER in the last 72 hours. Hgb A1c No results for input(s): HGBA1C in the last 72 hours. Lipid Profile No results for input(s): CHOL,  HDL, LDLCALC, TRIG, CHOLHDL, LDLDIRECT in the last 72 hours. Thyroid function studies No results for input(s): TSH, T4TOTAL, T3FREE, THYROIDAB in the last 72 hours.  Invalid input(s): FREET3 Anemia work  up No results for input(s): VITAMINB12, FOLATE, FERRITIN, TIBC, IRON, RETICCTPCT in the last 72 hours. Urinalysis    Component Value Date/Time   COLORURINE YELLOW 08/04/2018 1338   APPEARANCEUR CLEAR 08/04/2018 1338   LABSPEC 1.017 08/04/2018 1338   PHURINE 6.0 08/04/2018 1338   GLUCOSEU NEGATIVE 08/04/2018 1338   HGBUR SMALL (A) 08/04/2018 [place] 08/04/2018 [place] 08/04/2018 [place] 08/04/2018 1338   NITRITE NEGATIVE 08/04/2018 [place] 08/04/2018 1338   Sepsis Labs Invalid input(s): PROCALCITONIN,  WBC,  LACTICIDVEN Microbiology No results found for this or any previous visit (from the past 240 hour(s)).   Time coordinating discharge:  38 minutes  SIGNED:   Georgette Shell, MD  [facility] 12/03/2018, 10:09 AM Pager   If 7PM-7AM, please contact night-coverage [email or web address] Password [email or web address]

## 2018-12-17 NOTE — Progress Notes (Signed)
Report called to [place] at [facility].

## 2018-12-18 ENCOUNTER — Non-Acute Institutional Stay (SKILLED_NURSING_FACILITY): Payer: Medicare Other | Admitting: Adult Health

## 2018-12-18 ENCOUNTER — Encounter: Payer: Self-pay | Admitting: Adult Health

## 2018-12-18 ENCOUNTER — Other Ambulatory Visit: Payer: Self-pay | Admitting: Adult Health

## 2018-12-18 DIAGNOSIS — G5793 Unspecified mononeuropathy of bilateral lower limbs: Secondary | ICD-10-CM

## 2018-12-18 DIAGNOSIS — C3431 Malignant neoplasm of lower lobe, right bronchus or lung: Secondary | ICD-10-CM | POA: Diagnosis not present

## 2018-12-18 DIAGNOSIS — C7949 Secondary malignant neoplasm of other parts of nervous system: Secondary | ICD-10-CM

## 2018-12-18 DIAGNOSIS — R339 Retention of urine, unspecified: Secondary | ICD-10-CM

## 2018-12-18 DIAGNOSIS — I442 Atrioventricular block, complete: Secondary | ICD-10-CM

## 2018-12-18 DIAGNOSIS — I4821 Permanent atrial fibrillation: Secondary | ICD-10-CM

## 2018-12-18 DIAGNOSIS — G8220 Paraplegia, unspecified: Secondary | ICD-10-CM

## 2018-12-18 DIAGNOSIS — K5909 Other constipation: Secondary | ICD-10-CM

## 2018-12-18 DIAGNOSIS — R600 Localized edema: Secondary | ICD-10-CM

## 2018-12-18 DIAGNOSIS — F419 Anxiety disorder, unspecified: Secondary | ICD-10-CM

## 2018-12-18 DIAGNOSIS — E785 Hyperlipidemia, unspecified: Secondary | ICD-10-CM

## 2018-12-18 MED ORDER — OXYCODONE HCL 5 MG PO TABS: 5.0000 mg | ORAL_TABLET | ORAL | 0 refills | Status: DC | PRN

## 2018-12-18 MED ORDER — CLONAZEPAM 1 MG PO TABS: 1.0000 mg | ORAL_TABLET | Freq: Four times a day (QID) | ORAL | 0 refills | Status: AC

## 2018-12-18 NOTE — Progress Notes (Signed)
Location:    [facility] Room Number: 154/D Place of Service:  SNF (31)   CODE STATUS: DNR  Allergies  Allergen Reactions  . Codeine Other (See Comments)    Dizziness, stomach pain- "Allergic," per [facility]    Chief Complaint  Patient presents with  . Hospitalization Follow-up    Hospitalization Follow Up     HPI:  He is a long term resident of this facility who has been hospitalized from 11-26-18 through 12/10/2018. He had had lumbar lumbar laminectomy in May his legs have continued to get weaker. He went back to neurosurgeon. He went to the hospital for a myelogram for further surgical intervention. He was found to have metastatic cancer. He had palliative radiation therapy. He has returned; will be followed by hospice care. He does have pain; states that oxycodone is effective in his pain management. We have discussed his medications; he is willing to stop the lipitor; he wants to continue eliquis. Will continue to be followed for his chronic illnesses including: afib; constipation; anxiety.   Past Medical History:  Diagnosis Date  . Adenopathy    RIGHT ADRENAL  . Anxiety   . Arthritis    KNEES/HANDS  . CAD (coronary artery disease)    a. Prior known CTO of LCX with recanalizationby cath 2008;  b. 09/2012 NSTEMI/Cath/PCI: LM nl, LAD 34m, D1 50p, LCX 100p (2.75x16 Promus Premier DES), 80-47m, OM1 30, RCA 53m (3.0x16 Promus Premier DES), EF 50-55%.  [name] History of echocardiogram    Echo 2/18: Moderate LVH, EF 55-60, normal wall motion, trivial AI, moderate LAE, mild RVE, mild RAE, mildly elevated pulmonary pressure  . Hx of melanoma of skin   . 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 Malignant neoplasm of hilus of lung (HCC)    NON-SMALL-CELL    . Non-small cell lung cancer ([place])    a. s/p resection and XRT.  [name] PAF (paroxysmal atrial fibrillation) ([place])    a. not currently on anticoagulation (09/2012)  . Presence of permanent cardiac pacemaker   . 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 Uses walker     Past Surgical History:  Procedure Laterality Date  . ADRENALEXTOMY  02/2000  . CARDIAC CATHETERIZATION  2008   LAD 40, D1 OK, D2 50, CFX 90, RCA OK, EF 60%; Consider PCI PRN but the stenosis is quite complex and we would end up jailing several large posterolateral branches    . HERNIA REPAIR  05/27/2018  . INGUINAL HERNIA REPAIR Right 05/27/2018   Procedure: INCARCERATED RIGHT INGUINAL HERNIA REPAIR;  Surgeon: Erroll Luna, MD;  Location: [facility];  Service: General;  Laterality: Right;  . LEFT HEART CATHETERIZATION WITH CORONARY ANGIOGRAM N/A 10/17/2012   Procedure: LEFT HEART CATHETERIZATION WITH CORONARY ANGIOGRAM;  Surgeon: Sherren Mocha, MD;  Location: [facility] CATH LAB;  Service: Cardiovascular;  Laterality: N/A;  . LOBECTOMY LEFT UPPER LOBE  08/15/2001  . LUMBAR LAMINECTOMY/DECOMPRESSION MICRODISCECTOMY N/A 07/25/2018   Procedure: Lumbar Four-Five Laminectomy;  Surgeon: Kristeen Miss, MD;  Location: [facility];  Service: Neurosurgery;  Laterality: N/A;  Lumbar Four-Five Laminectomy  . PACEMAKER IMPLANT N/A 08/29/2016   Procedure: Pacemaker Implant;  Surgeon: Evans Lance, MD;  Location: [place] CV LAB;  Service: Cardiovascular;  Laterality: N/A;  . Right VATS, mini thoracotomy, wedge resection of right upper  01/18/2004    Social History   Socioeconomic History  . Marital status: Divorced    Spouse name: Not on file  .  Number of children: Not on file  . Years of education: Not on file  . Highest education level: Not on file  Occupational History  . Occupation: Retired    Fish farm manager: [employer]  . Financial resource strain: Not on file  . Food insecurity    Worry: Not on file    Inability: Not on file  . Transportation needs    Medical: Not on file    Non-medical: Not on file  Tobacco Use  . Smoking status: Former Smoker    Packs/day: 1.00    Years: 40.00    Pack years: 40.00    Types: Cigarettes     Quit date: 08/15/2001    Years since quitting: 17.3  . Smokeless tobacco: Never Used  Substance and Sexual Activity  . Alcohol use: No  . Drug use: No  . Sexual activity: Not Currently  Lifestyle  . Physical activity    Days per week: Not on file    Minutes per session: Not on file  . Stress: Not on file  Relationships  . Social Herbalist on phone: Not on file    Gets together: Not on file    Attends religious service: Not on file    Active member of club or organization: Not on file    Attends meetings of clubs or organizations: Not on file    Relationship status: Not on file  . Intimate partner violence    Fear of current or ex partner: Not on file    Emotionally abused: Not on file    Physically abused: Not on file    Forced sexual activity: Not on file  Other Topics Concern  . Not on file  Social History Narrative  . Not on file   Family History  Problem Relation Age of Onset  . Heart attack Father   . Cancer Mother        lung  . Cancer Cousin        breast      VITAL SIGNS BP 122/77   Pulse 64   Temp 98.7 F (37.1 C) (Oral)   Resp 20   Ht 5\' 11"  (1.803 m)   Wt 188 lb 12.8 oz (85.6 kg)   SpO2 94%   BMI 26.33 kg/m   Outpatient Encounter Medications as of 12/18/2018  Medication Sig  . acetaminophen (TYLENOL) 325 MG tablet Take 2 tablets (650 mg total) by mouth every 6 (six) hours as needed for mild pain (or temp > 100).  [name] apixaban (ELIQUIS) 5 MG TABS tablet Take 5 mg by mouth 2 (two) times daily.  [name] atorvastatin (LIPITOR) 20 MG tablet Take 20 mg by mouth at bedtime.   . clonazePAM (KLONOPIN) 1 MG tablet Take 1 tablet (1 mg total) by mouth 3 (three) times daily.  . furosemide (LASIX) 40 MG tablet Take 40 mg by mouth daily.  [name] gabapentin (NEURONTIN) 100 MG capsule Take 100 mg by mouth at bedtime.  [name] ipratropium-albuterol (DUONEB) 0.5-2.5 (3) MG/3ML SOLN Take 3 mLs by nebulization 2 (two) times daily.  . magnesium hydroxide (MILK OF  MAGNESIA) 400 MG/5ML suspension Take 30 mLs by mouth daily as needed for mild constipation.  . metoprolol succinate (TOPROL-XL) 25 MG 24 hr tablet Take 25 mg by mouth daily.  . nitroGLYCERIN (NITROSTAT) 0.4 MG SL tablet Place 1 tablet (0.4 mg total) under the tongue every 5 (five) minutes x 3 doses as needed for chest pain.  [name]  NON FORMULARY Diet Type: Regular,  NAS, Consistent Carbohydrate  . NON FORMULARY ACE Wraps to BLE from toes to knees during the day and remove at night. Twice A Day  . polyethylene glycol (MIRALAX / GLYCOLAX) 17 g packet Take 17 g by mouth daily.  [name] Silver (ALLEVYN AG GENTLE BORDER EX) Apply topically See admin instructions. Apply to bilateral buttocks after cleansing with normal saline  . spironolactone (ALDACTONE) 25 MG tablet Take 25 mg by mouth daily.  . tamsulosin (FLOMAX) 0.4 MG CAPS capsule Take 0.4 mg by mouth every evening.   No facility-administered encounter medications on file as of 12/18/2018.      SIGNIFICANT DIAGNOSTIC EXAMS   PREVIOUS:   07-29-18: chest x-ray:  1. No acute cardiopulmonary disease. 2. Stable post treatment changes in the upper left hemithorax. 3. Stable irregular nodular opacities in the mid and upper right lung, please see recent chest CT report 07/10/2018 for details.  TODAY;  11-26-18: myelogram lumbar/thoracic:  1. 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 Mild chronic appearing lumbar spinal stenosis at L1-L2 is likely stable since that time, with decompressed L3-L4 and L4-L5 levels and no other significant lumbar spinal stenosis. 2. Non-specific thickening and nodularity of the cauda equina at the T12, L3, and L4 levels, with a superimposed unusual and bulbous appearance of the conus medullaris, which though initially felt related to a mixed contrast injection at the time of lumbar CT images (1133 hours) remained unchanged on delayed thoracic spine  images at 1241 hours. And with no interval cephalad migration of intrathecal contrast, despite absent CT evidence of any thoracic spinal stenosis. 3. This constellation of clinical and imaging findings therefore raises the possibility of intrathecal metastatic disease with involvement of the conus and cauda equina  11-28-18: MRI of thoracic spine:  Enhancing mass lesion in the conus medullaris. Multiple small enhancing masses along the surface of the cord in the upper and midthoracic spine. 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 Recommend MRI of the brain without with contrast evaluate for metastatic disease. No evidence of bony metastatic disease  11-28-18: MRI of lumbar spine: Enhancing mass involving the distal spinal cord and conus medullaris. This caused myelographic near complete block 2 days ago. 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 Review of the thoracic MRI demonstrates multiple enhancing masses along the surface of the cord in the thoracic spine, therefore metastatic disease is felt to be most likely. Negative for bony metastatic disease lumbar spine  11-29-18: MRI of brain:  1. No metastatic disease to the brain or cerebral meninges is identified. 2. Possible 12 mm scalp soft tissue metastasis near the right temple (series 19, image 18). Alternatively this might be an inflamed proteinaceous dermal lesion. 3. No metastatic disease identified in the visible upper cervical spine.  12-02-18: ct of abdomen and pelvis:  1. Stable exam. Similar appearance of post treatment changes within bilateral lungs without evidence for diseaeprogression/recurrence. 2. Stable appearance of part solid nodules within the right lung. 3. Pure ground-glass attenuating nodule within the anterior right upper lobe is also unchanged. 4. Aortic Atherosclerosis  and Emphysema  5. Coronary artery atherosclerotic calcifications.    LABS REVIEWED  PREVIOUS:   07-26-18 wbc 13.8; hgb 13.9; hct 41.8; mcv 89.5 ;plt 139; glucose 143; bun 16; creat 0.93; k+ 3.9; na++ 138; ca 8.6 tsh 0.408 07-29-18: wbc 18.6; hgb 12.6; hct 38.7; mcv 90.0; plt 143  glucose  112; bun 23; creat 1.00; k+ 3.9; na++ 138; ca 8.6 urine culture: e-coli ESBL: cipro   Blood culture: enterobacteriaceae species  07-31-18: wbc 8.9; hgb 12.9; hct 38.5; mcv 89.3 ;plt 139; glucose 124; bun 29; creat 1.16; k+ 3.7; na++ 133; ca 8.2  07-31-18: urine culture: none 08-13-18: wbc 8.6; hgb 12.7; hct 39.4; mcv 90.4 ;plt 278; glucose 103; bun 19; creat 0.76; k+ 4.1; na++ 134; ca 8.0  09-10-18: glucose 99; bun 19; creat 0.86 ;k+ 3.7; na++ 136; ca 8.3  10-05-18: guaiac neg 10-06-27: guaiac positive 10-07-18: chol 116; bun 59; trig 95; hdl 38 10-08-18: hep C neg 10-17-18: guaiac neg  11-18-18: glucose 109; bun 19; creat 0,65; k+ 3.8; na++ 138 ca 8.3  TODAY;   11-26-18: wbc 8.6; hgb 14.6; hct 42.6; mcv 85.2; plt 219; glucose 145; bun 14; creat 0.72; k+ 4.0; na++ 124; ca 8.4; liver normal albumin 3.1 11-27-18: tsh 1.309 sed rate 6 ANA neg 12-05-18; wbc 7.9; hgb 13.9; hct 42.0; mcv 88.2; plt 179 glucose 113; bun 30; creat 0.81; k+ 4.0; na++ 127; ca 8,7; liver normal albumin 3.3 12-14-18: wbc 8.3; hgb 14.1; hct 43.0; mcv 89.4; plt 187; glucose 157; bun 28; creat 0.71; k+ 4.5; na+ 129; ca 8.6     Review of Systems  Constitutional: Negative for malaise/fatigue.  Respiratory: Negative for cough and shortness of breath.   Cardiovascular: Positive for leg swelling. Negative for chest pain and palpitations.  Gastrointestinal: Negative for abdominal pain, constipation and heartburn.  Musculoskeletal: Positive for back pain and myalgias. Negative for joint pain.       Has back and right rib cage pain   Skin: Negative.   Neurological: Negative for dizziness.  Psychiatric/Behavioral: Positive for depression. The patient is not nervous/anxious.     Physical Exam Constitutional:      General: He is not in  acute distress.    Appearance: He is well-developed. He is not diaphoretic.  Neck:     Musculoskeletal: Neck supple.     Thyroid: No thyromegaly.  Cardiovascular:     Rate and Rhythm: Normal rate and regular rhythm.     Pulses: Normal pulses.     Heart sounds: Normal heart sounds.     Comments: Psychologist, forensic present  Pulmonary:     Effort: Pulmonary effort is normal. No respiratory distress.     Breath sounds: Normal breath sounds.  Abdominal:     General: Bowel sounds are normal. There is no distension.     Palpations: Abdomen is soft.     Tenderness: There is no abdominal tenderness.  Genitourinary:    Comments: Foley  Musculoskeletal:     Right lower leg: Edema present.     Left lower leg: Edema present.     Comments: Trace bilateral lower extremity edema Bilateral lower extremities in ace wraps  Is unable to move lower extremities   Lymphadenopathy:     Cervical: No cervical adenopathy.  Skin:    General: Skin is warm and dry.     Comments: Right ischium DTI: 2 x 18 cm Sacrum unstaged: necrosis/yellow exudate: 4.6 x 2.0 x 0.5 cm  Neurological:     Mental Status: He is alert and oriented to person, place, and time.  Psychiatric:        Mood and Affect: Mood normal.       ASSESSMENT/ PLAN:  TODAY;   1.  Permanent atrial fibrillation/complete heart block: heart rate is stable is status post pace maker: will continue toprol  xl 25 mg daily for rate control has prn ntg  will continue eliquis 5 mg twice daily at this time he does not want to stop the eliquis  2. 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 Will begin oxycodone 5 mg every 4 hours as needed for pain and will monitor his status.   3. Paraplegia/neuropathy involving bilateral lower extremities: has bilateral lower extremity pain: will increase Neurontin to 100 mg  twice daily   4. Urinary retention: is without change: has long term foley: will continue flomax daily   5. Bilateral lower extremity edema: is stable will continue lasix 40 mg daily and aldactone 25 mg daily is using ace wraps.   6. Chronic constipation: is stable will continue miralax daily   7. Dyslipidemia: is stable will stop his lipitor as he is now comfort care  8. Anxiety: is worse will increase klonopin to 1 mg every 6 hours and will monitor his status.            MD is aware of resident's narcotic use and is in agreement with current plan of care. We will attempt to wean resident as appropriate.  Ok Edwards NP [facility]  Contact [phone] Monday through Friday 8am- 5pm  After hours call [phone]

## 2018-12-19 DEATH — deceased

## 2018-12-24 ENCOUNTER — Non-Acute Institutional Stay (SKILLED_NURSING_FACILITY): Payer: Medicare Other | Admitting: Adult Health

## 2018-12-24 ENCOUNTER — Encounter: Payer: Self-pay | Admitting: Adult Health

## 2018-12-24 ENCOUNTER — Encounter: Payer: Medicare Other | Admitting: *Deleted

## 2018-12-24 DIAGNOSIS — C3431 Malignant neoplasm of lower lobe, right bronchus or lung: Secondary | ICD-10-CM

## 2018-12-24 DIAGNOSIS — C7949 Secondary malignant neoplasm of other parts of nervous system: Secondary | ICD-10-CM | POA: Diagnosis not present

## 2018-12-24 DIAGNOSIS — G5793 Unspecified mononeuropathy of bilateral lower limbs: Secondary | ICD-10-CM | POA: Diagnosis not present

## 2018-12-24 DIAGNOSIS — G8220 Paraplegia, unspecified: Secondary | ICD-10-CM | POA: Diagnosis not present

## 2018-12-24 DIAGNOSIS — R339 Retention of urine, unspecified: Secondary | ICD-10-CM

## 2018-12-24 NOTE — Progress Notes (Signed)
Location:    [place] Room Number: 154/D Place of Service:  SNF (31)   CODE STATUS: DNR  Allergies  Allergen Reactions  . Codeine Other (See Comments)    Dizziness, stomach pain- "Allergic," per [facility]   Chief Complaint  Patient presents with  . 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     HPI:  He is a 73 year old long tem resident of this facility being seen for the management of his chronic illnesses; lung cancer with mets; paraplegia; edema; urine retention. His pain medication is effective for pain management. He has a poor appetite; he denies any anxiety or depressive thoughts; is followed by hospice care.   Past Medical History:  Diagnosis Date  . Adenopathy    RIGHT ADRENAL  . Anxiety   . Arthritis    KNEES/HANDS  . CAD (coronary artery disease)    a. Prior known CTO of LCX with recanalizationby cath 2008;  b. 09/2012 NSTEMI/Cath/PCI: LM nl, LAD 50m, D1 50p, LCX 100p (2.75x16 Promus Premier DES), 80-23m, OM1 30, RCA 61m (3.0x16 Promus Premier DES), EF 50-55%.  [name] History of echocardiogram    Echo 2/18: Moderate LVH, EF 55-60, normal wall motion, trivial AI, moderate LAE, mild RVE, mild RAE, mildly elevated pulmonary pressure  . Hx of melanoma of skin   . 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 Malignant neoplasm of hilus of lung (HCC)    NON-SMALL-CELL    . Non-small cell lung cancer ([place])    a. s/p resection and XRT.  [name] PAF (paroxysmal atrial fibrillation) ([place])    a. not currently on anticoagulation (09/2012)  . Presence of permanent cardiac pacemaker   . 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 Uses walker     Past Surgical History:  Procedure  Laterality Date  . ADRENALEXTOMY  02/2000  . CARDIAC CATHETERIZATION  2008   LAD 40, D1 OK, D2 50, CFX 90, RCA OK, EF 60%; Consider PCI PRN but the stenosis is quite complex and we would end up jailing several large posterolateral branches    . HERNIA REPAIR  05/27/2018  . INGUINAL HERNIA REPAIR Right 05/27/2018   Procedure: INCARCERATED RIGHT INGUINAL HERNIA REPAIR;  Surgeon: Erroll Luna, MD;  Location: [place];  Service: General;  Laterality: Right;  . LEFT HEART CATHETERIZATION WITH CORONARY ANGIOGRAM N/A 10/17/2012   Procedure: LEFT HEART CATHETERIZATION WITH CORONARY ANGIOGRAM;  Surgeon: Sherren Mocha, MD;  Location: [facility] CATH LAB;  Service: Cardiovascular;  Laterality: N/A;  . LOBECTOMY LEFT UPPER LOBE  08/15/2001  . LUMBAR LAMINECTOMY/DECOMPRESSION MICRODISCECTOMY N/A 07/25/2018   Procedure: Lumbar Four-Five Laminectomy;  Surgeon: Kristeen Miss, MD;  Location: [place];  Service: Neurosurgery;  Laterality: N/A;  Lumbar Four-Five Laminectomy  . PACEMAKER IMPLANT N/A 08/29/2016   Procedure: Pacemaker Implant;  Surgeon: Evans Lance, MD;  Location: [facility] CV LAB;  Service: Cardiovascular;  Laterality: N/A;  . Right VATS, mini thoracotomy, wedge resection of right upper  01/18/2004    Social History   Socioeconomic History  . Marital status: Divorced    Spouse name: Not on file  . Number of children: Not on file  . Years of  education: Not on file  . Highest education level: Not on file  Occupational History  . Occupation: Retired    Fish farm manager: [employer]  . Financial resource strain: Not on file  . Food insecurity    Worry: Not on file    Inability: Not on file  . Transportation needs    Medical: Not on file    Non-medical: Not on file  Tobacco Use  . Smoking status: Former Smoker    Packs/day: 1.00    Years: 40.00    Pack years: 40.00    Types: Cigarettes    Quit date: 08/15/2001    Years since quitting: 17.3  . Smokeless tobacco: Never Used   Substance and Sexual Activity  . Alcohol use: No  . Drug use: No  . Sexual activity: Not Currently  Lifestyle  . Physical activity    Days per week: Not on file    Minutes per session: Not on file  . Stress: Not on file  Relationships  . Social Herbalist on phone: Not on file    Gets together: Not on file    Attends religious service: Not on file    Active member of club or organization: Not on file    Attends meetings of clubs or organizations: Not on file    Relationship status: Not on file  . Intimate partner violence    Fear of current or ex partner: Not on file    Emotionally abused: Not on file    Physically abused: Not on file    Forced sexual activity: Not on file  Other Topics Concern  . Not on file  Social History Narrative  . Not on file   Family History  Problem Relation Age of Onset  . Heart attack Father   . Cancer Mother        lung  . Cancer Cousin        breast      VITAL SIGNS BP 110/70   Pulse 66   Temp 98.2 F (36.8 C) (Oral)   Resp 20   Ht 5\' 11"  (1.803 m)   Wt 188 lb 11.2 oz (85.6 kg)   SpO2 94%   BMI 26.32 kg/m   Outpatient Encounter Medications as of 12/24/2018  Medication Sig  . acetaminophen (TYLENOL) 325 MG tablet Take 2 tablets (650 mg total) by mouth every 6 (six) hours as needed for mild pain (or temp > 100).  [name] apixaban (ELIQUIS) 5 MG TABS tablet Take 5 mg by mouth 2 (two) times daily.  . clonazePAM (KLONOPIN) 1 MG tablet Take 1 tablet (1 mg total) by mouth every 6 (six) hours.  . Collagenase (SANTYL EX) Clean wound to coccyx with N/S, apply Santyl ointment and cover with allevyn foam dressing. Change bid & prn. Twice A Day  . furosemide (LASIX) 40 MG tablet Take 40 mg by mouth daily.  [name] gabapentin (NEURONTIN) 100 MG capsule Take 100 mg by mouth 2 (two) times daily.   [name] ipratropium-albuterol (DUONEB) 0.5-2.5 (3) MG/3ML SOLN Take 3 mLs by nebulization 2 (two) times daily.  . magnesium hydroxide (MILK OF MAGNESIA) 400  MG/5ML suspension Take 30 mLs by mouth daily as needed for mild constipation.  . metoprolol succinate (TOPROL-XL) 25 MG 24 hr tablet Take 25 mg by mouth daily.  . nitroGLYCERIN (NITROSTAT) 0.4 MG SL tablet Place 1 tablet (0.4 mg total) under the tongue every 5 (five) minutes x 3 doses as needed  for chest pain.  . NON FORMULARY Diet Type: Regular,  NAS, Consistent Carbohydrate  . NON FORMULARY ACE Wraps to BLE from toes to knees during the day and remove at night. Twice A Day  . oxyCODONE (ROXICODONE) 5 MG immediate release tablet Take 1 tablet (5 mg total) by mouth every 4 (four) hours as needed for severe pain.  . polyethylene glycol (MIRALAX / GLYCOLAX) 17 g packet Take 17 g by mouth daily.  [name] spironolactone (ALDACTONE) 25 MG tablet Take 25 mg by mouth daily.  . tamsulosin (FLOMAX) 0.4 MG CAPS capsule Take 0.4 mg by mouth every evening.  . [DISCONTINUED] atorvastatin (LIPITOR) 20 MG tablet Take 20 mg by mouth at bedtime.   . [DISCONTINUED] Silver (ALLEVYN AG GENTLE BORDER EX) Clean wound to coccyx with N/S, apply Santyl ointment and cover with allevyn foam dressing. Change bid & prn. Twice A Day   No facility-administered encounter medications on file as of 12/24/2018.      SIGNIFICANT DIAGNOSTIC EXAMS   PREVIOUS:   07-29-18: chest x-ray:  1. No acute cardiopulmonary disease. 2. Stable post treatment changes in the upper left hemithorax. 3. Stable irregular nodular opacities in the mid and upper right lung, please see recent chest CT report 07/10/2018 for details.  11-26-18: myelogram lumbar/thoracic:  1. 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 Mild chronic appearing lumbar spinal stenosis at L1-L2 is likely stable since that time, with decompressed L3-L4 and L4-L5 levels and no other significant lumbar spinal stenosis. 2. Non-specific thickening and nodularity of the cauda equina at the T12, L3, and L4  levels, with a superimposed unusual and bulbous appearance of the conus medullaris, which though initially felt related to a mixed contrast injection at the time of lumbar CT images (1133 hours) remained unchanged on delayed thoracic spine images at 1241 hours. And with no interval cephalad migration of intrathecal contrast, despite absent CT evidence of any thoracic spinal stenosis. 3. This constellation of clinical and imaging findings therefore raises the possibility of intrathecal metastatic disease with involvement of the conus and cauda equina  11-28-18: MRI of thoracic spine:  Enhancing mass lesion in the conus medullaris. Multiple small enhancing masses along the surface of the cord in the upper and midthoracic spine. 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 Recommend MRI of the brain without with contrast evaluate for metastatic disease. No evidence of bony metastatic disease  11-28-18: MRI of lumbar spine: Enhancing mass involving the distal spinal cord and conus medullaris. This caused myelographic near complete block 2 days ago. 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 Review of the thoracic MRI demonstrates multiple enhancing masses along the surface of the cord in the thoracic spine, therefore metastatic disease is felt to be most likely. Negative for bony metastatic disease lumbar spine  11-29-18: MRI of brain:  1. No metastatic disease to the brain or cerebral meninges is identified. 2. Possible 12 mm scalp soft tissue metastasis near the right temple (series 19, image 18). Alternatively this might be an inflamed proteinaceous dermal lesion. 3. No metastatic disease identified in the visible upper cervical spine.  12-02-18: ct of abdomen and pelvis:  1. Stable exam. Similar appearance of post treatment changes within bilateral lungs without evidence for diseaeprogression/recurrence. 2. Stable appearance of part solid  nodules within the right lung. 3. Pure ground-glass attenuating nodule within the anterior right upper lobe is also unchanged. 4. Aortic Atherosclerosis  and Emphysema  5. Coronary artery atherosclerotic calcifications.  NO NEW EXAMS.     LABS REVIEWED PREVIOUS:   07-26-18 wbc 13.8; hgb 13.9; hct 41.8; mcv 89.5 ;plt 139; glucose 143; bun 16; creat 0.93; k+ 3.9; na++ 138; ca 8.6 tsh 0.408 07-29-18: wbc 18.6; hgb 12.6; hct 38.7; mcv 90.0; plt 143  glucose 112; bun 23; creat 1.00; k+ 3.9; na++ 138; ca 8.6 urine culture: e-coli ESBL: cipro   Blood culture: enterobacteriaceae species  07-31-18: wbc 8.9; hgb 12.9; hct 38.5; mcv 89.3 ;plt 139; glucose 124; bun 29; creat 1.16; k+ 3.7; na++ 133; ca 8.2  07-31-18: urine culture: none 08-13-18: wbc 8.6; hgb 12.7; hct 39.4; mcv 90.4 ;plt 278; glucose 103; bun 19; creat 0.76; k+ 4.1; na++ 134; ca 8.0  09-10-18: glucose 99; bun 19; creat 0.86 ;k+ 3.7; na++ 136; ca 8.3  10-05-18: guaiac neg 10-06-27: guaiac positive 10-07-18: chol 116; bun 59; trig 95; hdl 38 10-08-18: hep C neg 10-17-18: guaiac neg  11-18-18: glucose 109; bun 19; creat 0,65; k+ 3.8; na++ 138 ca 8.3 11-26-18: wbc 8.6; hgb 14.6; hct 42.6; mcv 85.2; plt 219; glucose 145; bun 14; creat 0.72; k+ 4.0; na++ 124; ca 8.4; liver normal albumin 3.1 11-27-18: tsh 1.309 sed rate 6 ANA neg 12-05-18; wbc 7.9; hgb 13.9; hct 42.0; mcv 88.2; plt 179 glucose 113; bun 30; creat 0.81; k+ 4.0; na++ 127; ca 8,7; liver normal albumin 3.3 12-14-18: wbc 8.3; hgb 14.1; hct 43.0; mcv 89.4; plt 187; glucose 157; bun 28; creat 0.71; k+ 4.5; na+ 129; ca 8.6   NO NEW LABS.   Review of Systems  Constitutional: Negative for malaise/fatigue.  Respiratory: Negative for cough and shortness of breath.   Cardiovascular: Negative for chest pain, palpitations and leg swelling.  Gastrointestinal: Negative for abdominal pain, constipation and heartburn.  Musculoskeletal: Negative for back pain, joint pain and myalgias.  Skin: Negative.    Neurological: Negative for dizziness.  Psychiatric/Behavioral: The patient is not nervous/anxious.     Physical Exam Constitutional:      General: He is not in acute distress.    Appearance: He is well-developed. He is not diaphoretic.  Neck:     Musculoskeletal: Neck supple.     Thyroid: No thyromegaly.  Cardiovascular:     Rate and Rhythm: Normal rate and regular rhythm.     Pulses: Normal pulses.     Heart sounds: Normal heart sounds.     Comments: Has pace maker  Pulmonary:     Effort: Pulmonary effort is normal. No respiratory distress.     Breath sounds: Normal breath sounds.  Abdominal:     General: Bowel sounds are normal. There is no distension.     Palpations: Abdomen is soft.     Tenderness: There is no abdominal tenderness.  Genitourinary:    Comments: foley Musculoskeletal:     Right lower leg: Edema present.     Left lower leg: Edema present.     Comments: Trace bilateral lower extremity edema Bilateral lower extremities in ace wraps  Is unable to move lower extremities    Lymphadenopathy:     Cervical: No cervical adenopathy.  Skin:    General: Skin is warm and dry.     Comments: Right ischium DTI: 2 x 1.8 cm Sacrum:unstaged slough present; 4.6 x 2.0x x 0.5 cm  Neurological:     Mental Status: He is alert and oriented to person, place, and time.  Psychiatric:  Mood and Affect: Mood normal.      ASSESSMENT/ PLAN:  TODAY;   1. 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 Will continue oxycodone 5 mg every 4 hours as needed; does no desire routine dosing.   2. Paraplegia/neuropathy involving bilateral lower extremities: has bilateral lower extremity pain; will continue gabapentin 100 mg twice daily   3. Urinary retention: is stable has long term foley; will continue flomax 0.4 mg daily   PREVIOUS  4.  Bilateral lower extremity edema: is stable will continue lasix 40 mg daily and aldactone 25 mg daily is using ace wraps.   5. Chronic constipation: is stable will continue miralax daily   6. Dyslipidemia: is stable off lipitor  Will monitor   7. Anxiety: is stable will continue  klonopin  1 mg every 6 hours and will monitor his status.   8. Unstaged coccyx/unstaged right buttock ulcerations: without change will continue current plan of care.   9.  Permanent atrial fibrillation/complete heart block: heart rate is stable is status post pace maker: will continue toprol xl 25 mg daily for rate control has prn ntg  will continue eliquis 5 mg twice daily at this time he does not want to stop the eliquis    MD is aware of resident's narcotic use and is in agreement with current plan of care. We will attempt to wean resident as appropriate.  Ok Edwards NP [facility]  Contact [phone] Monday through Friday 8am- 5pm  After hours call [phone]

## 2018-12-25 ENCOUNTER — Non-Acute Institutional Stay (SKILLED_NURSING_FACILITY): Payer: Medicare Other | Admitting: Internal Medicine

## 2018-12-25 ENCOUNTER — Encounter: Payer: Self-pay | Admitting: Internal Medicine

## 2018-12-25 DIAGNOSIS — G5790 Unspecified mononeuropathy of unspecified lower limb: Secondary | ICD-10-CM | POA: Insufficient documentation

## 2018-12-25 DIAGNOSIS — G8220 Paraplegia, unspecified: Secondary | ICD-10-CM | POA: Diagnosis not present

## 2018-12-25 DIAGNOSIS — N400 Enlarged prostate without lower urinary tract symptoms: Secondary | ICD-10-CM

## 2018-12-25 DIAGNOSIS — I2510 Atherosclerotic heart disease of native coronary artery without angina pectoris: Secondary | ICD-10-CM

## 2018-12-25 DIAGNOSIS — C7949 Secondary malignant neoplasm of other parts of nervous system: Secondary | ICD-10-CM

## 2018-12-25 DIAGNOSIS — E785 Hyperlipidemia, unspecified: Secondary | ICD-10-CM | POA: Insufficient documentation

## 2018-12-25 DIAGNOSIS — C3431 Malignant neoplasm of lower lobe, right bronchus or lung: Secondary | ICD-10-CM

## 2018-12-25 DIAGNOSIS — E871 Hypo-osmolality and hyponatremia: Secondary | ICD-10-CM

## 2018-12-25 DIAGNOSIS — R339 Retention of urine, unspecified: Secondary | ICD-10-CM

## 2018-12-25 DIAGNOSIS — I4821 Permanent atrial fibrillation: Secondary | ICD-10-CM

## 2018-12-25 DIAGNOSIS — G834 Cauda equina syndrome: Secondary | ICD-10-CM

## 2018-12-25 DIAGNOSIS — F419 Anxiety disorder, unspecified: Secondary | ICD-10-CM

## 2018-12-25 DIAGNOSIS — I5032 Chronic diastolic (congestive) heart failure: Secondary | ICD-10-CM

## 2018-12-25 NOTE — Progress Notes (Signed)
: Provider:  Hennie Duos., MD Location:  [facility] Room Number: 154-D Place of Service:  SNF ([id number])  PCP: Hennie Duos, MD Patient Care Team: Hennie Duos, MD as PCP - General (Internal Medicine) Nahser, Wonda Cheng, MD as PCP - Cardiology (Cardiology) Evans Lance, MD as PCP - Electrophysiology (Cardiology) Nicanor Alcon, MD (Thoracic Surgery) Tyler Pita, MD (Radiation Oncology) [facility] ([place]) Nyoka Cowden Phylis Bougie, NP as Nurse Practitioner (Geriatric Medicine)  Extended Emergency Contact Information Primary Emergency Contact: Musial,Ronnie Address: [address], [place] Phone: [phone] Mobile Phone: [phone] Relation: Brother     Allergies: Codeine  Chief Complaint  Patient presents with   Readmit To SNF    Readmission to [facility]    HPI: Patient is a 73 y.o. male with spinal stenosis, non-small cell cancer, paroxysmal atrial fib on Eliquis, who presented to any [facility] ED with progressive weakness in the lower extremities since last May.  Patient has severe spondylitic stenosis of L4-L5.  4 months ago he underwent surgical decompression and was sent to rehab.  Despite efforts at rehab his legs became progressively weaker with sensory changes as well as bowel and bladder incontinence.  Patient was evaluated by neurosurgery, myelogram was performed on 9/8 as patient became near paraplegic.  Myogram revealed a high-grade block at the thoracolumbar junction.  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  MRI brain showed no mets to the brain or cerebral meninges.  Patient was admitted to  [facility] from 9/8-29 with neurology neurosurgery and radiation oncology consultations plan for 10 days of palliative radiation inpatient treatments due to difficulty with transport.  Oncology had discussions with patient about further plan/prognosis.  Palliative care is also on board.  Patient is admitted to skilled nursing facility for hospice care.  At the time of admissions all of his medications were still being given to him.  While at SNF patient will be followed for paroxysmal atrial fibrillation treated with metoprolol and Eliquis, coronary artery disease treated with metoprolol sublingual nitroglycerin and statin and anxiety treated with clonazepam.  Past Medical History:  Diagnosis Date   Adenopathy    RIGHT ADRENAL   Anxiety    Arthritis    KNEES/HANDS   CAD (coronary artery disease)    a. Prior known CTO of LCX with recanalizationby cath 2008;  b. 09/2012 NSTEMI/Cath/PCI: LM nl, LAD 72m, D1 50p, LCX 100p (2.75x16 Promus Premier DES), 80-95m, OM1 30, RCA 36m (3.0x16 Promus Premier DES), EF 50-55%.   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 s/p resection and XRT.   PAF (paroxysmal atrial fibrillation) ([place])    a. 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   Uses walker     Past Surgical History:  Procedure Laterality Date   ADRENALEXTOMY  02/2000   CARDIAC CATHETERIZATION  2008   LAD 40, D1 OK, D2 50, CFX 90, RCA OK, EF 60%; Consider PCI PRN but the stenosis is quite complex and we would end up jailing several large posterolateral branches     HERNIA REPAIR  05/27/2018    INGUINAL HERNIA REPAIR Right 05/27/2018   Procedure: INCARCERATED RIGHT INGUINAL HERNIA REPAIR;  Surgeon: Erroll Luna, MD;  Location: [facility];  Service: General;  Laterality: Right;   LEFT HEART CATHETERIZATION WITH CORONARY ANGIOGRAM N/A 10/17/2012   Procedure: LEFT HEART CATHETERIZATION WITH CORONARY ANGIOGRAM;  Surgeon: Sherren Mocha, MD;  Location: [facility] CATH LAB;  Service: Cardiovascular;  Laterality: N/A;   LOBECTOMY LEFT UPPER LOBE  08/15/2001   LUMBAR LAMINECTOMY/DECOMPRESSION MICRODISCECTOMY N/A 07/25/2018   Procedure: Lumbar Four-Five Laminectomy;  Surgeon: Kristeen Miss, MD;  Location: [facility];  Service: Neurosurgery;  Laterality: N/A;  Lumbar Four-Five Laminectomy   PACEMAKER IMPLANT N/A 08/29/2016   Procedure: Pacemaker Implant;  Surgeon: Evans Lance, MD;  Location: [facility] CV LAB;  Service: Cardiovascular;  Laterality: N/A;   Right VATS, mini thoracotomy, wedge resection of right upper  01/18/2004    Allergies as of 12/25/2018      Reactions   Codeine Other (See Comments)   Dizziness, stomach pain- "Allergic," per [facility]      Medication List    Notice   This visit is during an admission. Changes to the med list made in this visit will be reflected in the After Visit Summary of the admission.     No orders of the defined types were placed in this encounter.   Immunization History  Administered Date(s) Administered   Fluad Quad(high Dose 65+) 12/04/2018   Influenza, High Dose Seasonal PF 12/16/2016, 11/17/2017   Influenza-Unspecified 11/18/2013   Pneumococcal Conjugate-13 08/15/2013   Pneumococcal Polysaccharide-23 12/20/2011   Tdap 07/22/2012    Social History   Tobacco Use   Smoking status: Former Smoker    Packs/day: 1.00    Years: 40.00    Pack years: 40.00    Types: Cigarettes    Quit date: 08/15/2001    Years since quitting: 17.3   Smokeless tobacco: Never Used  Substance Use Topics   Alcohol use: No    Family history is   Family  History  Problem Relation Age of Onset   Heart attack Father    Cancer Mother        lung   Cancer Cousin        breast      Review of Systems  DATA OBTAINED: from patient GENERAL:  no fevers, fatigue, appetite changes SKIN: No itching, or rash EYES: No eye pain, redness, discharge EARS: No earache, tinnitus, change in hearing NOSE: No congestion, drainage or bleeding  MOUTH/THROAT: No mouth or tooth pain, No sore throat RESPIRATORY: No cough, wheezing, SOB CARDIAC: No chest pain, palpitations, lower extremity edema  GI: No abdominal pain, No N/V/D or constipation, No heartburn or reflux  GU: No dysuria, frequency or urgency, or incontinence  MUSCULOSKELETAL: Patient does admit to some back pain but he does not want any pain medicine for it NEUROLOGIC: No headache, dizziness or focal weakness PSYCHIATRIC: No c/o anxiety or sadness   Vitals:   12/25/18 1006  BP: 110/70  Pulse: 66  Resp: 20  Temp: 98.2 F (36.8 C)  SpO2: 94%  SpO2 Readings from Last 1 Encounters:  12/25/18 94%   Body mass index is 26.32 kg/m.     Physical Exam  GENERAL APPEARANCE: Alert, conversant,  No acute distress.  SKIN: No diaphoresis rash HEAD: Normocephalic, atraumatic  EYES: Conjunctiva/lids clear. Pupils round, reactive. EOMs intact.  EARS: External exam WNL, canals clear. Hearing grossly normal.  NOSE: No deformity or discharge.  MOUTH/THROAT: Lips w/o lesions  RESPIRATORY: Breathing is even, unlabored. Lung sounds are clear   CARDIOVASCULAR: Heart RRR no murmurs, rubs or gallops. No peripheral edema.   GASTROINTESTINAL: Abdomen is soft, non-tender, not distended w/ normal bowel sounds. GENITOURINARY: Bladder non tender, not distended  MUSCULOSKELETAL: Patient appears uncomfortable but declined any pain medication NEUROLOGIC:  Cranial nerves 2-12 grossly intact. 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 ([place]) 12/02/2018   Lumbar stenosis with neurogenic claudication 11/26/2018   Paraplegia ([place]) 11/26/2018   Hyponatremia 11/26/2018   Chronic anxiety 11/23/2018   Chronic constipation 08/31/2018   Chronic non-seasonal allergic rhinitis 08/08/2018   Urinary retention 08/05/2018   UTI due to extended-spectrum beta lactamase (ESBL) producing Escherichia coli 08/01/2018   Cauda equina syndrome ([place]) 07/25/2018   Bilateral lower extremity edema 07/25/2018   Incarcerated inguinal hernia 05/24/2018   Complete heart block (HCC) 08/29/2016   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 ([place]) 09/06/2015   Pneumothorax after biopsy 08/24/2015   Hyperlipidemia 09/14/2014   Mobitz type 1 second degree atrioventricular block 10/17/2012   Hx of NSTEMI tx with DES to RCA and DES to LCx in 2014 10/16/2012   Chest pain, atypical 07/02/2012   Malignant neoplasm of hilus of lung (HCC)    Adenopathy    Anxiety    Arthritis    Hx of melanoma of skin    Permanent atrial fibrillation    CAD (coronary artery disease)       Labs reviewed: Basic Metabolic Panel:    Component Value Date/Time   NA 129 (L) 12/14/2018 1039   NA 142 11/03/2016 1210   NA 143 05/01/2016 1254   K 4.5 12/14/2018 1039   K 4.8 05/01/2016 1254   CL 93 (L) 12/14/2018 1039   CO2 27 12/14/2018 1039   CO2 30 (H) 05/01/2016 1254   GLUCOSE 157 (H) 12/14/2018 1039   GLUCOSE 90 05/01/2016 1254   BUN 28 (H) 12/14/2018 1039   BUN 11 11/03/2016 1210   BUN 15.4 07/28/2016 1044   CREATININE 0.71 12/14/2018 1039   CREATININE 0.94 03/05/2018 1427   CREATININE 1.0 07/28/2016 1044   CALCIUM 8.6 (L) 12/14/2018 1039   CALCIUM 9.6 05/01/2016 1254   PROT 6.3 (L) 12/05/2018 0724   PROT 6.3 11/03/2016 1210   ALBUMIN 3.3  (L) 12/05/2018 0724   ALBUMIN 4.2 11/03/2016 1210   AST 14 (L) 12/05/2018 0724   AST 13 (L) 03/05/2018 1427   ALT 15 12/05/2018 0724   ALT 13 03/05/2018 1427   ALKPHOS 90 12/05/2018 0724   BILITOT 0.7 12/05/2018 0724   BILITOT 1.0 03/05/2018 1427   GFRNONAA >60 12/14/2018 1039   GFRNONAA >60 03/05/2018 1427   GFRAA >60 12/14/2018 1039   GFRAA >60 03/05/2018 1427    Recent Labs    12/05/18 0724  12/09/18 0518  12/11/18 0448 12/12/18 0512 12/14/18 1039  NA 127*   < > 128*   < > 129* 128* 129*  K 4.0   < > 4.4   < > 4.8 4.4 4.5  CL 93*   < > 93*   < > 96* 97* 93*  CO2 27   < > 27   < > 24 24 27   GLUCOSE 113*   < > 117*   < > 133* 116* 157*  BUN 30*   < > 32*   < > 34* 31* 28*  CREATININE 0.81   < > 0.79   < > 0.81 0.71 0.71  CALCIUM 8.7*   < > 8.8*   < > 9.0 8.6* 8.6*  MG 1.8  --  1.9  --   --   --   --    < > = values in this interval not displayed.   Liver Function Tests: Recent Labs    11/26/18 1840 11/27/18 0316 12/05/18 0724  AST 16 14* 14*  ALT 15 13 15   ALKPHOS 103 88 90  BILITOT 1.0 0.8 0.7  PROT 6.3* 5.8* 6.3*  ALBUMIN 3.1* 2.9* 3.3*   Recent Labs    05/24/18 1746  LIPASE 27   No results for input(s): AMMONIA in the last 8760 hours. CBC: Recent Labs    08/13/18 0700 11/26/18 1840  12/01/18 0324 12/05/18 0724 12/07/18 0409 12/14/18 1039  WBC 8.6 8.6   < > 7.4 7.9 7.8 8.3  NEUTROABS 6.2 6.9  --  5.1  --   --   --   HGB 12.7* 14.6   < > 14.0 13.9 14.2 14.1  HCT 39.4 42.6   < > 40.5 42.0 43.0 43.0  MCV 90.4 85.2   < > 85.8 88.2 88.1 89.4  PLT 278 219   < > 207 179 197 187   < > = values in this interval not displayed.   Lipid Recent Labs    10/07/18 0742  CHOL 116  HDL 38*  LDLCALC 59  TRIG 95    Cardiac Enzymes: Recent Labs    11/27/18 0316  CKTOTAL 76  CKMB 2.7   BNP: Recent Labs    07/26/18 0218  BNP 188.3*   No results found for: [facility] Lab Results  Component Value Date   HGBA1C 5.5 10/16/2012   Lab Results    Component Value Date   TSH 1.309 11/27/2018   No results found for: VITAMINB12 No results found for: FOLATE No results found for: IRON, TIBC, FERRITIN  Imaging and Procedures obtained prior to SNF admission: No results found.   Not all labs, radiology exams or other studies done during hospitalization come through on my EPIC note; however they are reviewed by me.    Assessment and Plan  Spinal mass with paraplegia/incontinence neurogenic bladder-thought to be secondary to lung cancer but could be related to prior history of melanoma as well.  Patient received XRT 10 treatments during his hospital stay without any improvement in his function of lower extremities.  He continues to have bowel and bladder incontinence he will continue to need ongoing Foley.  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  Hyponatremia-sodium 129, likely from [facility] SNF- not sure about blood draws, will follow with BMP  Paroxysmal atrial fib SNF- continue metoprolol 25 mg  daily and Eliquis 5 mg twice daily  Chronic diastolic congestive heart failure SN -- compensated; continue Toprol-XL 25 mg daily, Lasix 40 mg daily and Spironolactone 25 mg daily  Anxiety SNF-continue clonazepam 1 mg 3 times daily  Coronary artery disease SNF- stable; continue metoprolol XL 25 mg daily, sublingual nitroglycerin as needed; patient is on Eliquis and patient is on statin  BPH SNF-continue Flomax 0.4 mg daily   Time spent greater than 45 minutes;> 50% of time with patient was spent reviewing records, labs, tests and studies, counseling and developing plan of care  Hennie Duos, MD

## 2018-12-27 ENCOUNTER — Other Ambulatory Visit: Payer: Self-pay | Admitting: Adult Health

## 2018-12-27 ENCOUNTER — Non-Acute Institutional Stay (SKILLED_NURSING_FACILITY): Payer: Medicare Other | Admitting: Adult Health

## 2018-12-27 ENCOUNTER — Encounter: Payer: Self-pay | Admitting: Adult Health

## 2018-12-27 DIAGNOSIS — C7949 Secondary malignant neoplasm of other parts of nervous system: Secondary | ICD-10-CM | POA: Diagnosis not present

## 2018-12-27 DIAGNOSIS — C3431 Malignant neoplasm of lower lobe, right bronchus or lung: Secondary | ICD-10-CM

## 2018-12-27 DIAGNOSIS — G8220 Paraplegia, unspecified: Secondary | ICD-10-CM

## 2018-12-27 MED ORDER — MORPHINE SULFATE (CONCENTRATE) 10 MG /0.5 ML PO SOLN: 5.0000 mg | ORAL | 0 refills | Status: AC | PRN

## 2018-12-27 NOTE — Progress Notes (Signed)
Location:    [place] [facility] Room Number: 154/D Place of Service:  SNF (31)   CODE STATUS: DNR  Allergies  Allergen Reactions  . Codeine Other (See Comments)    Dizziness, stomach pain- "Allergic," per [facility]    Chief Complaint  Patient presents with  . Acute Visit    Change in status    HPI:  Staff report a change in his status. He is weaker. He would like a pain medication which does not require him to swallow a pill. We have discussed his medications. His po intake is poor; more than likely he is not receiving benefits from his diuretics. He wants to continue eliquis at this time. His appetite is variable. He denies any anxiety. There are no reports of fevers.   Past Medical History:  Diagnosis Date  . Adenopathy    RIGHT ADRENAL  . Anxiety   . Arthritis    KNEES/HANDS  . CAD (coronary artery disease)    a. Prior known CTO of LCX with recanalizationby cath 2008;  b. 09/2012 NSTEMI/Cath/PCI: LM nl, LAD 23m, D1 50p, LCX 100p (2.75x16 Promus Premier DES), 80-32m, OM1 30, RCA 39m (3.0x16 Promus Premier DES), EF 50-55%.  [name] History of echocardiogram    Echo 2/18: Moderate LVH, EF 55-60, normal wall motion, trivial AI, moderate LAE, mild RVE, mild RAE, mildly elevated pulmonary pressure  . Hx of melanoma of skin   . 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 Malignant neoplasm of hilus of lung (HCC)    NON-SMALL-CELL    . Non-small cell lung cancer ([place])    a. s/p resection and XRT.  [name] PAF (paroxysmal atrial fibrillation) ([place])    a. not currently on anticoagulation (09/2012)  . Presence of permanent cardiac pacemaker   . 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 Uses walker     Past Surgical History:  Procedure Laterality Date  . ADRENALEXTOMY  02/2000  . CARDIAC CATHETERIZATION  2008   LAD 40, D1 OK, D2 50, CFX 90, RCA OK, EF 60%; Consider PCI PRN but the stenosis is quite complex and we would end up jailing several large posterolateral  branches    . HERNIA REPAIR  05/27/2018  . INGUINAL HERNIA REPAIR Right 05/27/2018   Procedure: INCARCERATED RIGHT INGUINAL HERNIA REPAIR;  Surgeon: Erroll Luna, MD;  Location: [place];  Service: General;  Laterality: Right;  . LEFT HEART CATHETERIZATION WITH CORONARY ANGIOGRAM N/A 10/17/2012   Procedure: LEFT HEART CATHETERIZATION WITH CORONARY ANGIOGRAM;  Surgeon: Sherren Mocha, MD;  Location: [facility] CATH LAB;  Service: Cardiovascular;  Laterality: N/A;  . LOBECTOMY LEFT UPPER LOBE  08/15/2001  . LUMBAR LAMINECTOMY/DECOMPRESSION MICRODISCECTOMY N/A 07/25/2018   Procedure: Lumbar Four-Five Laminectomy;  Surgeon: Kristeen Miss, MD;  Location: [place];  Service: Neurosurgery;  Laterality: N/A;  Lumbar Four-Five Laminectomy  . PACEMAKER IMPLANT N/A 08/29/2016   Procedure: Pacemaker Implant;  Surgeon: Evans Lance, MD;  Location: [place] CV LAB;  Service: Cardiovascular;  Laterality: N/A;  . Right VATS, mini thoracotomy, wedge resection of right upper  01/18/2004    Social History   Socioeconomic History  . Marital status: Divorced    Spouse name: Not on file  . Number of children: Not on file  . Years of education: Not on file  . Highest education level: Not on file  Occupational History  . Occupation: Retired    Fish farm manager: [employer]  . Financial resource strain: Not  on file  . Food insecurity    Worry: Not on file    Inability: Not on file  . Transportation needs    Medical: Not on file    Non-medical: Not on file  Tobacco Use  . Smoking status: Former Smoker    Packs/day: 1.00    Years: 40.00    Pack years: 40.00    Types: Cigarettes    Quit date: 08/15/2001    Years since quitting: 17.3  . Smokeless tobacco: Never Used  Substance and Sexual Activity  . Alcohol use: No  . Drug use: No  . Sexual activity: Not Currently  Lifestyle  . Physical activity    Days per week: Not on file    Minutes per session: Not on file  . Stress: Not on file   Relationships  . Social Herbalist on phone: Not on file    Gets together: Not on file    Attends religious service: Not on file    Active member of club or organization: Not on file    Attends meetings of clubs or organizations: Not on file    Relationship status: Not on file  . Intimate partner violence    Fear of current or ex partner: Not on file    Emotionally abused: Not on file    Physically abused: Not on file    Forced sexual activity: Not on file  Other Topics Concern  . Not on file  Social History Narrative   He quit smoking about 17 years ago. His smoking use included cigarettes. He has a 40.00 pack-year smoking history. He has never used smokeless tobacco. He reports that he does not drink alcohol or use drugs.      Family History  Problem Relation Age of Onset  . Heart attack Father   . Cancer Mother        lung  . Cancer Cousin        breast      VITAL SIGNS BP 137/82   Pulse 62   Temp (!) 97.1 F (36.2 C) (Oral)   Resp 20   Ht 5\' 11"  (1.803 m)   Wt 188 lb 11.2 oz (85.6 kg)   SpO2 94%   BMI 26.32 kg/m   Outpatient Encounter Medications as of 12/27/2018  Medication Sig  . acetaminophen (TYLENOL) 325 MG tablet Take 2 tablets (650 mg total) by mouth every 6 (six) hours as needed for mild pain (or temp > 100).  [name] apixaban (ELIQUIS) 5 MG TABS tablet Take 5 mg by mouth 2 (two) times daily.  . clonazePAM (KLONOPIN) 1 MG tablet Take 1 tablet (1 mg total) by mouth every 6 (six) hours.  . gabapentin (NEURONTIN) 100 MG capsule Take 100 mg by mouth 2 (two) times daily.   [name] ipratropium-albuterol (DUONEB) 0.5-2.5 (3) MG/3ML SOLN Take 3 mLs by nebulization 2 (two) times daily.  . magnesium hydroxide (MILK OF MAGNESIA) 400 MG/5ML suspension Take 30 mLs by mouth daily as needed for mild constipation.  . metoprolol succinate (TOPROL-XL) 25 MG 24 hr tablet Take 25 mg by mouth daily.  . Morphine Sulfate (MORPHINE CONCENTRATE) 10 mg / 0.5 ml concentrated  solution Take 0.25 mLs (5 mg total) by mouth every 2 (two) hours as needed for severe pain.  . nitroGLYCERIN (NITROSTAT) 0.4 MG SL tablet Place 1 tablet (0.4 mg total) under the tongue every 5 (five) minutes x 3 doses as needed for chest pain.  . NON FORMULARY  Diet Type: Regular,  NAS, Consistent Carbohydrate  . polyethylene glycol (MIRALAX / GLYCOLAX) 17 g packet Take 17 g by mouth daily.  . tamsulosin (FLOMAX) 0.4 MG CAPS capsule Take 0.4 mg by mouth every evening.  . [DISCONTINUED] Collagenase (SANTYL EX) Clean wound to coccyx with N/S, apply Santyl ointment and cover with allevyn foam dressing. Change bid & prn. Twice A Day  . [DISCONTINUED] NON FORMULARY ACE Wraps to BLE from toes to knees during the day and remove at night. Twice A Day   No facility-administered encounter medications on file as of 12/27/2018.      SIGNIFICANT DIAGNOSTIC EXAMS   PREVIOUS:   07-29-18: chest x-ray:  1. No acute cardiopulmonary disease. 2. Stable post treatment changes in the upper left hemithorax. 3. Stable irregular nodular opacities in the mid and upper right lung, please see recent chest CT report 07/10/2018 for details.  11-26-18: myelogram lumbar/thoracic:  1. 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 Mild chronic appearing lumbar spinal stenosis at L1-L2 is likely stable since that time, with decompressed L3-L4 and L4-L5 levels and no other significant lumbar spinal stenosis. 2. Non-specific thickening and nodularity of the cauda equina at the T12, L3, and L4 levels, with a superimposed unusual and bulbous appearance of the conus medullaris, which though initially felt related to a mixed contrast injection at the time of lumbar CT images (1133 hours) remained unchanged on delayed thoracic spine images at 1241 hours. And with no interval cephalad migration of intrathecal contrast, despite absent CT evidence of any thoracic  spinal stenosis. 3. This constellation of clinical and imaging findings therefore raises the possibility of intrathecal metastatic disease with involvement of the conus and cauda equina  11-28-18: MRI of thoracic spine:  Enhancing mass lesion in the conus medullaris. Multiple small enhancing masses along the surface of the cord in the upper and midthoracic spine. 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 Recommend MRI of the brain without with contrast evaluate for metastatic disease. No evidence of bony metastatic disease  11-28-18: MRI of lumbar spine: Enhancing mass involving the distal spinal cord and conus medullaris. This caused myelographic near complete block 2 days ago. 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 Review of the thoracic MRI demonstrates multiple enhancing masses along the surface of the cord in the thoracic spine, therefore metastatic disease is felt to be most likely. Negative for bony metastatic disease lumbar spine  11-29-18: MRI of brain:  1. No metastatic disease to the brain or cerebral meninges is identified. 2. Possible 12 mm scalp soft tissue metastasis near the right temple (series 19, image 18). Alternatively this might be an inflamed proteinaceous dermal lesion. 3. No metastatic disease identified in the visible upper cervical spine.  12-02-18: ct of abdomen and pelvis:  1. Stable exam. Similar appearance of post treatment changes within bilateral lungs without evidence for diseaeprogression/recurrence. 2. Stable appearance of part solid nodules within the right lung. 3. Pure ground-glass attenuating nodule within the anterior right upper lobe is also unchanged. 4. Aortic Atherosclerosis  and Emphysema  5. Coronary artery atherosclerotic calcifications.  NO NEW EXAMS.     LABS REVIEWED PREVIOUS:   07-26-18 wbc 13.8; hgb 13.9; hct 41.8; mcv 89.5 ;plt 139; glucose 143; bun 16; creat 0.93; k+ 3.9;  na++ 138; ca 8.6 tsh 0.408 07-29-18: wbc 18.6; hgb 12.6; hct 38.7; mcv 90.0; plt 143  glucose 112; bun 23; creat 1.00;  k+ 3.9; na++ 138; ca 8.6 urine culture: e-coli ESBL: cipro   Blood culture: enterobacteriaceae species  07-31-18: wbc 8.9; hgb 12.9; hct 38.5; mcv 89.3 ;plt 139; glucose 124; bun 29; creat 1.16; k+ 3.7; na++ 133; ca 8.2  07-31-18: urine culture: none 08-13-18: wbc 8.6; hgb 12.7; hct 39.4; mcv 90.4 ;plt 278; glucose 103; bun 19; creat 0.76; k+ 4.1; na++ 134; ca 8.0  09-10-18: glucose 99; bun 19; creat 0.86 ;k+ 3.7; na++ 136; ca 8.3  10-05-18: guaiac neg 10-06-27: guaiac positive 10-07-18: chol 116; bun 59; trig 95; hdl 38 10-08-18: hep C neg 10-17-18: guaiac neg  11-18-18: glucose 109; bun 19; creat 0,65; k+ 3.8; na++ 138 ca 8.3 11-26-18: wbc 8.6; hgb 14.6; hct 42.6; mcv 85.2; plt 219; glucose 145; bun 14; creat 0.72; k+ 4.0; na++ 124; ca 8.4; liver normal albumin 3.1 11-27-18: tsh 1.309 sed rate 6 ANA neg 12-05-18; wbc 7.9; hgb 13.9; hct 42.0; mcv 88.2; plt 179 glucose 113; bun 30; creat 0.81; k+ 4.0; na++ 127; ca 8,7; liver normal albumin 3.3 12-14-18: wbc 8.3; hgb 14.1; hct 43.0; mcv 89.4; plt 187; glucose 157; bun 28; creat 0.71; k+ 4.5; na+ 129; ca 8.6   NO NEW LABS.    Review of Systems  Constitutional: Negative for malaise/fatigue.  Respiratory: Negative for cough and shortness of breath.   Cardiovascular: Negative for chest pain, palpitations and leg swelling.  Gastrointestinal: Negative for abdominal pain, constipation and heartburn.  Musculoskeletal: Negative for back pain, joint pain and myalgias.  Skin: Negative.   Neurological: Negative for dizziness.  Psychiatric/Behavioral: The patient is not nervous/anxious.      Physical Exam Constitutional:      General: He is not in acute distress.    Appearance: He is well-developed. He is not diaphoretic.     Comments: thin  Neck:     Musculoskeletal: Neck supple.     Thyroid: No thyromegaly.  Cardiovascular:     Rate and  Rhythm: Normal rate and regular rhythm.     Pulses: Normal pulses.     Heart sounds: Normal heart sounds.     Comments: Pace maker  Pulmonary:     Effort: Pulmonary effort is normal. No respiratory distress.     Breath sounds: Normal breath sounds.  Abdominal:     General: Bowel sounds are normal. There is no distension.     Palpations: Abdomen is soft.     Tenderness: There is no abdominal tenderness.  Genitourinary:    Comments: foley Musculoskeletal: Normal range of motion.     Right lower leg: Edema present.     Left lower leg: Edema present.     Comments: 1+ bilateral lower extremity edema Unable to move lower extremities   Lymphadenopathy:     Cervical: No cervical adenopathy.  Skin:    General: Skin is warm and dry.     Comments: Right ischium DTI: 1.8 x 1.8 cm Sacrum:unstaged slough present; 4.4 x 3.2 x 0.5 cm   Neurological:     Mental Status: He is alert. Mental status is at baseline.  Psychiatric:        Mood and Affect: Mood normal.      ASSESSMENT/ PLAN:  TODAY;   1. 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 Paraplegia  Will stop oxycodone; aldactone; lasix Will begin roxanol 5 mg every 2 hours as needed He is declining Will monitor his status.     MD is  aware of resident's narcotic use and is in agreement with current plan of care. We will attempt to wean resident as appropriate.  Ok Edwards NP [facility]  Contact [phone] Monday through Friday 8am- 5pm  After hours call [phone]

## 2018-12-29 ENCOUNTER — Encounter: Payer: Self-pay | Admitting: Internal Medicine

## 2018-12-29 DIAGNOSIS — N400 Enlarged prostate without lower urinary tract symptoms: Secondary | ICD-10-CM | POA: Insufficient documentation

## 2018-12-29 DIAGNOSIS — I5032 Chronic diastolic (congestive) heart failure: Secondary | ICD-10-CM | POA: Insufficient documentation

## 2018-12-31 ENCOUNTER — Non-Acute Institutional Stay (SKILLED_NURSING_FACILITY): Payer: Medicare Other | Admitting: Adult Health

## 2018-12-31 ENCOUNTER — Encounter: Payer: Self-pay | Admitting: Adult Health

## 2018-12-31 DIAGNOSIS — R627 Adult failure to thrive: Secondary | ICD-10-CM

## 2018-12-31 DIAGNOSIS — C7949 Secondary malignant neoplasm of other parts of nervous system: Secondary | ICD-10-CM | POA: Diagnosis not present

## 2018-12-31 DIAGNOSIS — G8220 Paraplegia, unspecified: Secondary | ICD-10-CM

## 2018-12-31 DIAGNOSIS — C3431 Malignant neoplasm of lower lobe, right bronchus or lung: Secondary | ICD-10-CM

## 2018-12-31 NOTE — Progress Notes (Signed)
Location:    [place] Room Number: 154/D Place of Service:  SNF (31)   CODE STATUS: DNR  Allergies  Allergen Reactions  . Codeine Other (See Comments)    Dizziness, stomach pain- "Allergic," per [facility]   Chief Complaint  Patient presents with  . 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      HPI:  He is a long term resident of this facility being seen for the management his chronic illnesses: failure to thrive; metastatic disease; lung care. His skin continues to decline. His po intake is poor. He is more confused. He is tolerating roxanol and continues to receive pain relief with current dose. He has had a fall out of bed without injury.   Past Medical History:  Diagnosis Date  . Adenopathy    RIGHT ADRENAL  . Anxiety   . Arthritis    KNEES/HANDS  . CAD (coronary artery disease)    a. Prior known CTO of LCX with recanalizationby cath 2008;  b. 09/2012 NSTEMI/Cath/PCI: LM nl, LAD 68m, D1 50p, LCX 100p (2.75x16 Promus Premier DES), 80-74m, OM1 30, RCA 32m (3.0x16 Promus Premier DES), EF 50-55%.  [name] History of echocardiogram    Echo 2/18: Moderate LVH, EF 55-60, normal wall motion, trivial AI, moderate LAE, mild RVE, mild RAE, mildly elevated pulmonary pressure  . Hx of melanoma of skin   . 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 Malignant neoplasm of hilus of lung (HCC)    NON-SMALL-CELL    . Non-small cell lung cancer ([place])    a. s/p resection and XRT.  [name] PAF (paroxysmal atrial fibrillation) ([place])    a. not currently on anticoagulation (09/2012)  . Presence of permanent cardiac pacemaker   . 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 Uses walker     Past Surgical History:  Procedure Laterality Date  . ADRENALEXTOMY  02/2000  . CARDIAC CATHETERIZATION   2008   LAD 40, D1 OK, D2 50, CFX 90, RCA OK, EF 60%; Consider PCI PRN but the stenosis is quite complex and we would end up jailing several large posterolateral branches    . HERNIA REPAIR  05/27/2018  . INGUINAL HERNIA REPAIR Right 05/27/2018   Procedure: INCARCERATED RIGHT INGUINAL HERNIA REPAIR;  Surgeon: Erroll Luna, MD;  Location: [place];  Service: General;  Laterality: Right;  . LEFT HEART CATHETERIZATION WITH CORONARY ANGIOGRAM N/A 10/17/2012   Procedure: LEFT HEART CATHETERIZATION WITH CORONARY ANGIOGRAM;  Surgeon: Sherren Mocha, MD;  Location: [facility] CATH LAB;  Service: Cardiovascular;  Laterality: N/A;  . LOBECTOMY LEFT UPPER LOBE  08/15/2001  . LUMBAR LAMINECTOMY/DECOMPRESSION MICRODISCECTOMY N/A 07/25/2018   Procedure: Lumbar Four-Five Laminectomy;  Surgeon: Kristeen Miss, MD;  Location: [place];  Service: Neurosurgery;  Laterality: N/A;  Lumbar Four-Five Laminectomy  . PACEMAKER IMPLANT N/A 08/29/2016   Procedure: Pacemaker Implant;  Surgeon: Evans Lance, MD;  Location: [place] CV LAB;  Service: Cardiovascular;  Laterality: N/A;  . Right VATS, mini thoracotomy, wedge resection of right upper  01/18/2004    Social History   Socioeconomic History  . Marital status: Divorced    Spouse name: Not on file  . Number of children: Not on file  . Years  of education: Not on file  . Highest education level: Not on file  Occupational History  . Occupation: Retired    Fish farm manager: [employer]  . Financial resource strain: Not on file  . Food insecurity    Worry: Not on file    Inability: Not on file  . Transportation needs    Medical: Not on file    Non-medical: Not on file  Tobacco Use  . Smoking status: Former Smoker    Packs/day: 1.00    Years: 40.00    Pack years: 40.00    Types: Cigarettes    Quit date: 08/15/2001    Years since quitting: 17.3  . Smokeless tobacco: Never Used  Substance and Sexual Activity  . Alcohol use: No  . Drug use: No  . Sexual  activity: Not Currently  Lifestyle  . Physical activity    Days per week: Not on file    Minutes per session: Not on file  . Stress: Not on file  Relationships  . Social Herbalist on phone: Not on file    Gets together: Not on file    Attends religious service: Not on file    Active member of club or organization: Not on file    Attends meetings of clubs or organizations: Not on file    Relationship status: Not on file  . Intimate partner violence    Fear of current or ex partner: Not on file    Emotionally abused: Not on file    Physically abused: Not on file    Forced sexual activity: Not on file  Other Topics Concern  . Not on file  Social History Narrative   He quit smoking about 17 years ago. His smoking use included cigarettes. He has a 40.00 pack-year smoking history. He has never used smokeless tobacco. He reports that he does not drink alcohol or use drugs.      Family History  Problem Relation Age of Onset  . Heart attack Father   . Cancer Mother        lung  . Cancer Cousin        breast      VITAL SIGNS BP 103/65   Pulse 88   Temp (!) 97.3 F (36.3 C) (Oral)   Resp 18   Ht 5\' 11"  (1.803 m)   Wt 183 lb (83 kg)   BMI 25.52 kg/m   Outpatient Encounter Medications as of 12/31/2018  Medication Sig  . acetaminophen (TYLENOL) 325 MG tablet Take 2 tablets (650 mg total) by mouth every 6 (six) hours as needed for mild pain (or temp > 100).  [name] apixaban (ELIQUIS) 5 MG TABS tablet Take 5 mg by mouth 2 (two) times daily.  . clonazePAM (KLONOPIN) 1 MG tablet Take 1 tablet (1 mg total) by mouth every 6 (six) hours.  . gabapentin (NEURONTIN) 100 MG capsule Take 100 mg by mouth 2 (two) times daily.   [name] ipratropium-albuterol (DUONEB) 0.5-2.5 (3) MG/3ML SOLN Take 3 mLs by nebulization 2 (two) times daily.  . magnesium hydroxide (MILK OF MAGNESIA) 400 MG/5ML suspension Take 30 mLs by mouth daily as needed for mild constipation.  . metoprolol succinate  (TOPROL-XL) 25 MG 24 hr tablet Take 25 mg by mouth daily.  . Morphine Sulfate (MORPHINE CONCENTRATE) 10 mg / 0.5 ml concentrated solution Take 0.25 mLs (5 mg total) by mouth every 2 (two) hours as needed for severe pain.  . nitroGLYCERIN (NITROSTAT)  0.4 MG SL tablet Place 1 tablet (0.4 mg total) under the tongue every 5 (five) minutes x 3 doses as needed for chest pain.  . NON FORMULARY Diet Type: Regular,  NAS, Consistent Carbohydrate  . polyethylene glycol (MIRALAX / GLYCOLAX) 17 g packet Take 17 g by mouth daily.  . tamsulosin (FLOMAX) 0.4 MG CAPS capsule Take 0.4 mg by mouth every evening.   No facility-administered encounter medications on file as of 12/31/2018.      SIGNIFICANT DIAGNOSTIC EXAMS   PREVIOUS:   07-29-18: chest x-ray:  1. No acute cardiopulmonary disease. 2. Stable post treatment changes in the upper left hemithorax. 3. Stable irregular nodular opacities in the mid and upper right lung, please see recent chest CT report 07/10/2018 for details.  11-26-18: myelogram lumbar/thoracic:  1. 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 Mild chronic appearing lumbar spinal stenosis at L1-L2 is likely stable since that time, with decompressed L3-L4 and L4-L5 levels and no other significant lumbar spinal stenosis. 2. Non-specific thickening and nodularity of the cauda equina at the T12, L3, and L4 levels, with a superimposed unusual and bulbous appearance of the conus medullaris, which though initially felt related to a mixed contrast injection at the time of lumbar CT images (1133 hours) remained unchanged on delayed thoracic spine images at 1241 hours. And with no interval cephalad migration of intrathecal contrast, despite absent CT evidence of any thoracic spinal stenosis. 3. This constellation of clinical and imaging findings therefore raises the possibility of intrathecal metastatic disease with  involvement of the conus and cauda equina  11-28-18: MRI of thoracic spine:  Enhancing mass lesion in the conus medullaris. Multiple small enhancing masses along the surface of the cord in the upper and midthoracic spine. 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 Recommend MRI of the brain without with contrast evaluate for metastatic disease. No evidence of bony metastatic disease  11-28-18: MRI of lumbar spine: Enhancing mass involving the distal spinal cord and conus medullaris. This caused myelographic near complete block 2 days ago. 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 Review of the thoracic MRI demonstrates multiple enhancing masses along the surface of the cord in the thoracic spine, therefore metastatic disease is felt to be most likely. Negative for bony metastatic disease lumbar spine  11-29-18: MRI of brain:  1. No metastatic disease to the brain or cerebral meninges is identified. 2. Possible 12 mm scalp soft tissue metastasis near the right temple (series 19, image 18). Alternatively this might be an inflamed proteinaceous dermal lesion. 3. No metastatic disease identified in the visible upper cervical spine.  12-02-18: ct of abdomen and pelvis:  1. Stable exam. Similar appearance of post treatment changes within bilateral lungs without evidence for diseaeprogression/recurrence. 2. Stable appearance of part solid nodules within the right lung. 3. Pure ground-glass attenuating nodule within the anterior right upper lobe is also unchanged. 4. Aortic Atherosclerosis  and Emphysema  5. Coronary artery atherosclerotic calcifications.  NO NEW EXAMS.     LABS REVIEWED PREVIOUS:   07-26-18 wbc 13.8; hgb 13.9; hct 41.8; mcv 89.5 ;plt 139; glucose 143; bun 16; creat 0.93; k+ 3.9; na++ 138; ca 8.6 tsh 0.408 07-29-18: wbc 18.6; hgb 12.6; hct 38.7; mcv 90.0; plt 143  glucose 112; bun 23; creat 1.00; k+ 3.9; na++ 138; ca 8.6  urine culture: e-coli ESBL: cipro   Blood culture: enterobacteriaceae species  07-31-18: wbc 8.9; hgb  12.9; hct 38.5; mcv 89.3 ;plt 139; glucose 124; bun 29; creat 1.16; k+ 3.7; na++ 133; ca 8.2  07-31-18: urine culture: none 08-13-18: wbc 8.6; hgb 12.7; hct 39.4; mcv 90.4 ;plt 278; glucose 103; bun 19; creat 0.76; k+ 4.1; na++ 134; ca 8.0  09-10-18: glucose 99; bun 19; creat 0.86 ;k+ 3.7; na++ 136; ca 8.3  10-05-18: guaiac neg 10-06-27: guaiac positive 10-07-18: chol 116; bun 59; trig 95; hdl 38 10-08-18: hep C neg 10-17-18: guaiac neg  11-18-18: glucose 109; bun 19; creat 0,65; k+ 3.8; na++ 138 ca 8.3 11-26-18: wbc 8.6; hgb 14.6; hct 42.6; mcv 85.2; plt 219; glucose 145; bun 14; creat 0.72; k+ 4.0; na++ 124; ca 8.4; liver normal albumin 3.1 11-27-18: tsh 1.309 sed rate 6 ANA neg 12-05-18; wbc 7.9; hgb 13.9; hct 42.0; mcv 88.2; plt 179 glucose 113; bun 30; creat 0.81; k+ 4.0; na++ 127; ca 8,7; liver normal albumin 3.3 12-14-18: wbc 8.3; hgb 14.1; hct 43.0; mcv 89.4; plt 187; glucose 157; bun 28; creat 0.71; k+ 4.5; na+ 129; ca 8.6   NO NEW LABS.   Review of Systems  Reason unable to perform ROS: confusion.     Physical Exam Constitutional:      General: He is not in acute distress.    Appearance: He is cachectic. He is not diaphoretic.  Neck:     Musculoskeletal: Neck supple.     Thyroid: No thyromegaly.  Cardiovascular:     Rate and Rhythm: Normal rate and regular rhythm.     Pulses: Normal pulses.     Heart sounds: Normal heart sounds.     Comments: Pace maker  Pulmonary:     Effort: Pulmonary effort is normal. No respiratory distress.     Comments: 02 dependent  Breath sounds diminished  Abdominal:     General: Bowel sounds are normal. There is no distension.     Palpations: Abdomen is soft.     Tenderness: There is no abdominal tenderness.  Genitourinary:    Comments: foley Musculoskeletal:     Right lower leg: Edema present.     Left lower leg: Edema present.     Comments:  Paraplegia 1-2+ bilateral lower extremity edema   Lymphadenopathy:     Cervical: No cervical adenopathy.  Skin:    General: Skin is warm and dry.     Comments: Right ischium:unstaged: 1.8 x 1.8 cm tan adherent soft tissue present Sacrum: unstaged: 4.4 x 3.2 x 0.5 cm foul  Neurological:     Mental Status: He is alert.     Comments: Is aware and alert to self.        ASSESSMENT/ PLAN:  TODAY;   1. Failure to thrive in adult 2. Paraplegia 3. 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  Will continue to be followed by hospice care.      MD is aware of resident's narcotic use and is in agreement with current plan of care. We will attempt to wean resident as appropriate.  Ok Edwards NP [facility]  Contact [phone] Monday through Friday 8am- 5pm  After hours call [phone]

## 2019-01-08 ENCOUNTER — Other Ambulatory Visit: Payer: Self-pay | Admitting: Adult Health

## 2019-01-08 ENCOUNTER — Encounter: Payer: Self-pay | Admitting: Adult Health

## 2019-01-08 ENCOUNTER — Non-Acute Institutional Stay (SKILLED_NURSING_FACILITY): Payer: Medicare Other | Admitting: Adult Health

## 2019-01-08 DIAGNOSIS — C7949 Secondary malignant neoplasm of other parts of nervous system: Secondary | ICD-10-CM | POA: Diagnosis not present

## 2019-01-08 DIAGNOSIS — C3431 Malignant neoplasm of lower lobe, right bronchus or lung: Secondary | ICD-10-CM | POA: Diagnosis not present

## 2019-01-08 DIAGNOSIS — R627 Adult failure to thrive: Secondary | ICD-10-CM

## 2019-01-08 NOTE — Progress Notes (Signed)
Location:  [place] Room Number: 938-B Place of Service:  SNF (31)   CODE STATUS: DNR  Allergies  Allergen Reactions  . Codeine Other (See Comments)    Dizziness, stomach pain- "Allergic," per [facility]   Chief Complaint  Patient presents with  . 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 He continues to decline; he is nominal po intake. He continues to receive roxanol every 2 hours as needed with pain relief. He is having increased difficulty with swallowing medications.   Past Medical History:  Diagnosis Date  . Adenopathy    RIGHT ADRENAL  . Anxiety   . Arthritis    KNEES/HANDS  . CAD (coronary artery disease)    a. Prior known CTO of LCX with recanalizationby cath 2008;  b. 09/2012 NSTEMI/Cath/PCI: LM nl, LAD 73m, D1 50p, LCX 100p (2.75x16 Promus Premier DES), 80-23m, OM1 30, RCA 31m (3.0x16 Promus Premier DES), EF 50-55%.  [name] History of echocardiogram    Echo 2/18: Moderate LVH, EF 55-60, normal wall motion, trivial AI, moderate LAE, mild RVE, mild RAE, mildly elevated pulmonary pressure  . Hx of melanoma of skin   . 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 Malignant neoplasm of hilus of lung (HCC)    NON-SMALL-CELL    . Non-small cell lung cancer ([place])    a. s/p resection and XRT.  [name] PAF (paroxysmal atrial fibrillation) ([place])    a. not currently on anticoagulation (09/2012)  . Presence of permanent cardiac pacemaker   . 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 Uses walker     Past Surgical History:  Procedure Laterality Date  . ADRENALEXTOMY  02/2000  . CARDIAC CATHETERIZATION  2008   LAD 40,  D1 OK, D2 50, CFX 90, RCA OK, EF 60%; Consider PCI PRN but the stenosis is quite complex and we would end up jailing several large posterolateral branches    . HERNIA REPAIR  05/27/2018  . INGUINAL HERNIA REPAIR Right 05/27/2018   Procedure: INCARCERATED RIGHT INGUINAL HERNIA REPAIR;  Surgeon: Erroll Luna, MD;  Location: [place];  Service: General;  Laterality: Right;  . LEFT HEART CATHETERIZATION WITH CORONARY ANGIOGRAM N/A 10/17/2012   Procedure: LEFT HEART CATHETERIZATION WITH CORONARY ANGIOGRAM;  Surgeon: Sherren Mocha, MD;  Location: [facility] CATH LAB;  Service: Cardiovascular;  Laterality: N/A;  . LOBECTOMY LEFT UPPER LOBE  08/15/2001  . LUMBAR LAMINECTOMY/DECOMPRESSION MICRODISCECTOMY N/A 07/25/2018   Procedure: Lumbar Four-Five Laminectomy;  Surgeon: Kristeen Miss, MD;  Location: [place];  Service: Neurosurgery;  Laterality: N/A;  Lumbar Four-Five Laminectomy  . PACEMAKER IMPLANT N/A 08/29/2016   Procedure: Pacemaker Implant;  Surgeon: Evans Lance, MD;  Location: [place] CV LAB;  Service: Cardiovascular;  Laterality: N/A;  . Right VATS, mini thoracotomy, wedge resection of right upper  01/18/2004    Social History   Socioeconomic History  . Marital status: Divorced    Spouse name: Not on file  . Number of children: Not on file  . Years of education: Not on file  . Highest  education level: Not on file  Occupational History  . Occupation: Retired    Fish farm manager: [employer]  . Financial resource strain: Not on file  . Food insecurity    Worry: Not on file    Inability: Not on file  . Transportation needs    Medical: Not on file    Non-medical: Not on file  Tobacco Use  . Smoking status: Former Smoker    Packs/day: 1.00    Years: 40.00    Pack years: 40.00    Types: Cigarettes    Quit date: 08/15/2001    Years since quitting: 17.4  . Smokeless tobacco: Never Used  Substance and Sexual Activity  . Alcohol use: No  . Drug use: No  . Sexual activity: Not  Currently  Lifestyle  . Physical activity    Days per week: Not on file    Minutes per session: Not on file  . Stress: Not on file  Relationships  . Social Herbalist on phone: Not on file    Gets together: Not on file    Attends religious service: Not on file    Active member of club or organization: Not on file    Attends meetings of clubs or organizations: Not on file    Relationship status: Not on file  . Intimate partner violence    Fear of current or ex partner: Not on file    Emotionally abused: Not on file    Physically abused: Not on file    Forced sexual activity: Not on file  Other Topics Concern  . Not on file  Social History Narrative   He quit smoking about 17 years ago. His smoking use included cigarettes. He has a 40.00 pack-year smoking history. He has never used smokeless tobacco. He reports that he does not drink alcohol or use drugs.      Family History  Problem Relation Age of Onset  . Heart attack Father   . Cancer Mother        lung  . Cancer Cousin        breast      VITAL SIGNS BP (!) 84/57   Pulse (!) 57   Temp 97.7 F (36.5 C) (Oral)   Resp 20   Ht 5\' 11"  (1.803 m)   Wt 183 lb (83 kg)   SpO2 94%   BMI 25.52 kg/m   Outpatient Encounter Medications as of 01/08/2019  Medication Sig  . acetaminophen (TYLENOL) 325 MG tablet Take 2 tablets (650 mg total) by mouth every 6 (six) hours as needed for mild pain (or temp > 100).  [name] apixaban (ELIQUIS) 5 MG TABS tablet Take 5 mg by mouth 2 (two) times daily.  . clonazePAM (KLONOPIN) 1 MG tablet Take 1 tablet (1 mg total) by mouth every 6 (six) hours.  . collagenase (SANTYL) ointment Apply 1 application topically 2 (two) times daily.  [name] gabapentin (NEURONTIN) 100 MG capsule Take 100 mg by mouth 2 (two) times daily.   [name] ipratropium-albuterol (DUONEB) 0.5-2.5 (3) MG/3ML SOLN Take 3 mLs by nebulization 2 (two) times daily.  . magnesium hydroxide (MILK OF MAGNESIA) 400 MG/5ML suspension Take  30 mLs by mouth daily as needed for mild constipation.  . Morphine Sulfate (MORPHINE CONCENTRATE) 10 mg / 0.5 ml concentrated solution Take 0.25 mLs (5 mg total) by mouth every 2 (two) hours as needed for severe pain.  . nitroGLYCERIN (NITROSTAT) 0.4 MG SL tablet Place 1  tablet (0.4 mg total) under the tongue every 5 (five) minutes x 3 doses as needed for chest pain.  . NON FORMULARY Diet Type: Regular,  NAS, Consistent Carbohydrate  . OXYGEN Inhale 2 L/min into the lungs continuous.  . polyethylene glycol (MIRALAX / GLYCOLAX) 17 g packet Take 17 g by mouth daily.  . tamsulosin (FLOMAX) 0.4 MG CAPS capsule Take 0.4 mg by mouth every evening.  . [DISCONTINUED] metoprolol succinate (TOPROL-XL) 25 MG 24 hr tablet Take 25 mg by mouth daily.   No facility-administered encounter medications on file as of 01/08/2019.      SIGNIFICANT DIAGNOSTIC EXAMS   PREVIOUS:   07-29-18: chest x-ray:  1. No acute cardiopulmonary disease. 2. Stable post treatment changes in the upper left hemithorax. 3. Stable irregular nodular opacities in the mid and upper right lung, please see recent chest CT report 07/10/2018 for details.  11-26-18: myelogram lumbar/thoracic:  1. 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 Mild chronic appearing lumbar spinal stenosis at L1-L2 is likely stable since that time, with decompressed L3-L4 and L4-L5 levels and no other significant lumbar spinal stenosis. 2. Non-specific thickening and nodularity of the cauda equina at the T12, L3, and L4 levels, with a superimposed unusual and bulbous appearance of the conus medullaris, which though initially felt related to a mixed contrast injection at the time of lumbar CT images (1133 hours) remained unchanged on delayed thoracic spine images at 1241 hours. And with no interval cephalad migration of intrathecal contrast, despite absent CT evidence of any thoracic  spinal stenosis. 3. This constellation of clinical and imaging findings therefore raises the possibility of intrathecal metastatic disease with involvement of the conus and cauda equina  11-28-18: MRI of thoracic spine:  Enhancing mass lesion in the conus medullaris. Multiple small enhancing masses along the surface of the cord in the upper and midthoracic spine. 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 Recommend MRI of the brain without with contrast evaluate for metastatic disease. No evidence of bony metastatic disease  11-28-18: MRI of lumbar spine: Enhancing mass involving the distal spinal cord and conus medullaris. This caused myelographic near complete block 2 days ago. 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 Review of the thoracic MRI demonstrates multiple enhancing masses along the surface of the cord in the thoracic spine, therefore metastatic disease is felt to be most likely. Negative for bony metastatic disease lumbar spine  11-29-18: MRI of brain:  1. No metastatic disease to the brain or cerebral meninges is identified. 2. Possible 12 mm scalp soft tissue metastasis near the right temple (series 19, image 18). Alternatively this might be an inflamed proteinaceous dermal lesion. 3. No metastatic disease identified in the visible upper cervical spine.  12-02-18: ct of abdomen and pelvis:  1. Stable exam. Similar appearance of post treatment changes within bilateral lungs without evidence for diseaeprogression/recurrence. 2. Stable appearance of part solid nodules within the right lung. 3. Pure ground-glass attenuating nodule within the anterior right upper lobe is also unchanged. 4. Aortic Atherosclerosis  and Emphysema  5. Coronary artery atherosclerotic calcifications.  NO NEW EXAMS.     LABS REVIEWED PREVIOUS:   07-26-18 wbc 13.8; hgb 13.9; hct 41.8; mcv 89.5 ;plt 139; glucose 143; bun 16; creat 0.93; k+ 3.9;  na++ 138; ca 8.6 tsh 0.408 07-29-18: wbc 18.6; hgb 12.6; hct 38.7; mcv 90.0; plt 143  glucose 112; bun 23; creat 1.00; k+  3.9; na++ 138; ca 8.6 urine culture: e-coli ESBL: cipro   Blood culture: enterobacteriaceae species  07-31-18: wbc 8.9; hgb 12.9; hct 38.5; mcv 89.3 ;plt 139; glucose 124; bun 29; creat 1.16; k+ 3.7; na++ 133; ca 8.2  07-31-18: urine culture: none 08-13-18: wbc 8.6; hgb 12.7; hct 39.4; mcv 90.4 ;plt 278; glucose 103; bun 19; creat 0.76; k+ 4.1; na++ 134; ca 8.0  09-10-18: glucose 99; bun 19; creat 0.86 ;k+ 3.7; na++ 136; ca 8.3  10-05-18: guaiac neg 10-06-27: guaiac positive 10-07-18: chol 116; bun 59; trig 95; hdl 38 10-08-18: hep C neg 10-17-18: guaiac neg  11-18-18: glucose 109; bun 19; creat 0,65; k+ 3.8; na++ 138 ca 8.3 11-26-18: wbc 8.6; hgb 14.6; hct 42.6; mcv 85.2; plt 219; glucose 145; bun 14; creat 0.72; k+ 4.0; na++ 124; ca 8.4; liver normal albumin 3.1 11-27-18: tsh 1.309 sed rate 6 ANA neg 12-05-18; wbc 7.9; hgb 13.9; hct 42.0; mcv 88.2; plt 179 glucose 113; bun 30; creat 0.81; k+ 4.0; na++ 127; ca 8,7; liver normal albumin 3.3 12-14-18: wbc 8.3; hgb 14.1; hct 43.0; mcv 89.4; plt 187; glucose 157; bun 28; creat 0.71; k+ 4.5; na+ 129; ca 8.6   NO NEW LABS.   Review of Systems  Unable to perform ROS: Medical condition (unable to partcipate )     Physical Exam Constitutional:      General: He is not in acute distress.    Appearance: He is cachectic. He is ill-appearing. He is not diaphoretic.  Neck:     Musculoskeletal: Neck supple.     Thyroid: No thyromegaly.  Cardiovascular:     Rate and Rhythm: Normal rate and regular rhythm.     Pulses: Normal pulses.     Heart sounds: Normal heart sounds.     Comments: Pace maker Pulmonary:     Effort: No respiratory distress.     Comments: 02 dependent  Breath sounds diminished  Abdominal:     General: Bowel sounds are normal. There is no distension.     Palpations: Abdomen is soft.     Tenderness: There is no abdominal  tenderness.  Genitourinary:    Comments: foley Musculoskeletal:     Right lower leg: No edema.     Left lower leg: No edema.     Comments: Paraplegia   Lymphadenopathy:     Cervical: No cervical adenopathy.  Skin:    General: Skin is warm and dry.     Comments: Right ischium:unstaged: 1.8 x 1.8 cm tan adherent soft tissue present Sacrum: unstaged: 4.4 x 3.2 x 0.5 cm foul   Neurological:     Comments: Is aware        ASSESSMENT/ PLAN:  TODAY;   1. Failure to thrive in adult 2. Paraplegia 3. 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          MD is aware of resident's narcotic use and is in agreement with current plan of care. We will attempt to wean resident as appropriate.  Ok Edwards NP [facility]  Contact [phone] Monday through Friday 8am- 5pm  After hours call [phone]

## 2019-01-09 DIAGNOSIS — R627 Adult failure to thrive: Secondary | ICD-10-CM | POA: Insufficient documentation

## 2019-01-12 ENCOUNTER — Telehealth: Payer: Self-pay | Admitting: Cardiology

## 2019-01-12 ENCOUNTER — Telehealth: Payer: Self-pay | Admitting: Internal Medicine

## 2019-01-12 NOTE — Telephone Encounter (Signed)
Received call on call that pt deceased.  Call was around 6:45pm on Sat night.  Order given to release body and condolences offered from [facility].

## 2019-01-12 NOTE — Telephone Encounter (Signed)
Error

## 2019-01-16 ENCOUNTER — Ambulatory Visit: Payer: Medicare Other | Admitting: Urology

## 2019-01-19 DEATH — deceased

## 2019-02-14 IMAGING — CT CT CHEST W/ CM
2 of 4 series · 15 of 36 positions shown, 18 images · IV contrast (omnipaque)
Comparison: 08/23/2017

CLINICAL DATA: Non-small cell lung cancer

EXAM:
CT CHEST WITH CONTRAST
TECHNIQUE: Multidetector CT imaging of the chest was performed during
intravenous contrast administration.
CONTRAST:  75mL OMNIPAQUE IOHEXOL 300 MG/ML  SOLN

[Series 2: axial st · axial · 0.94mm/px · z∈[-321,-33]mm · 12 of 172 slices shown, 15 images]
[im 14/172  mediastinal]
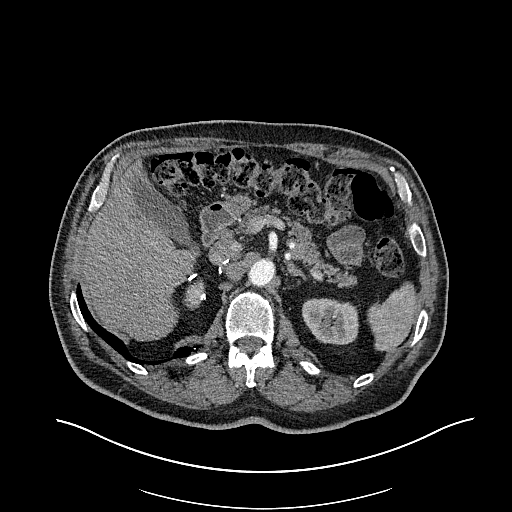
[im 14/172  lung]
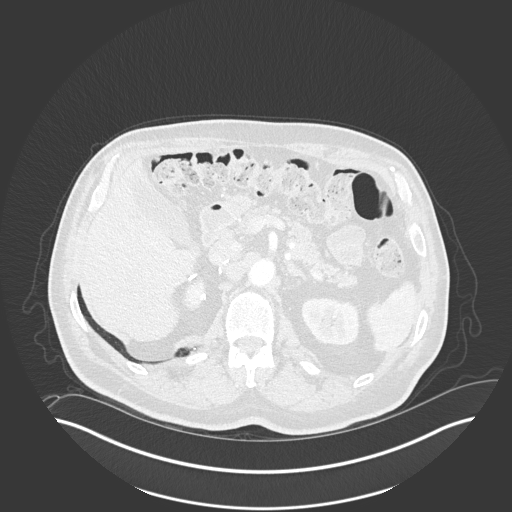
[im 27/172  lung]
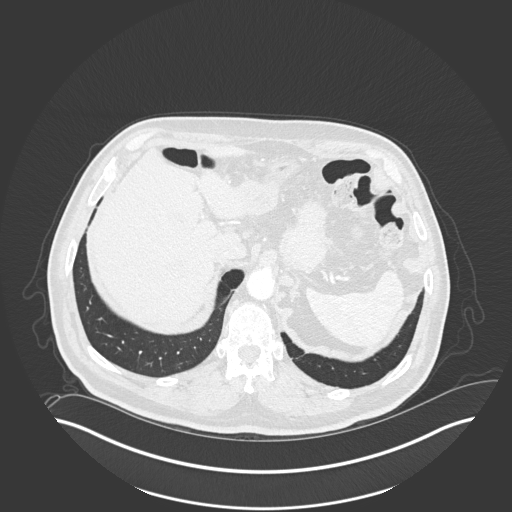
[im 40/172  lung]
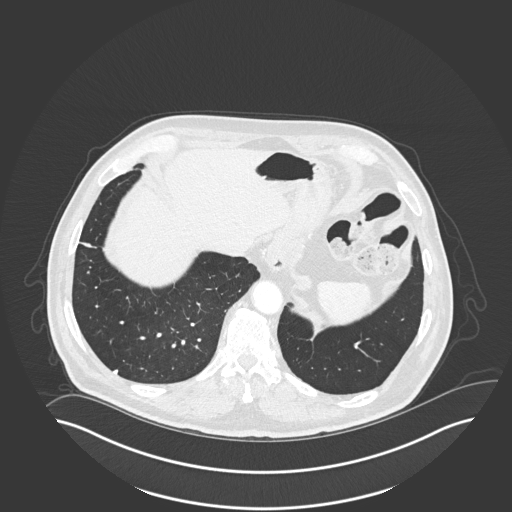
[im 53/172  lung]
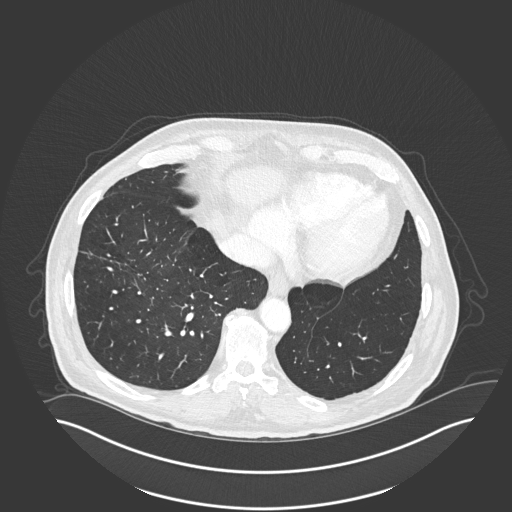
[im 66/172  mediastinal]
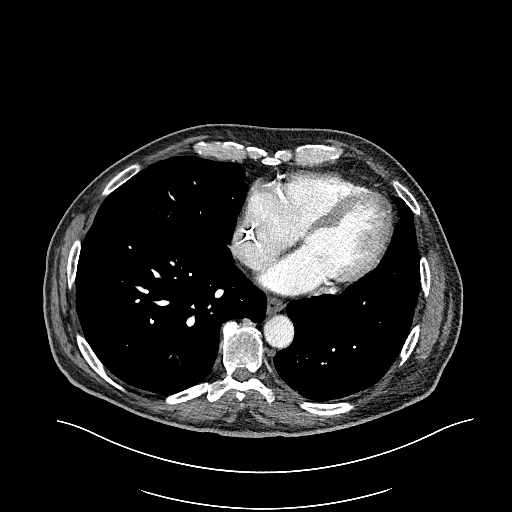
[im 66/172  lung]
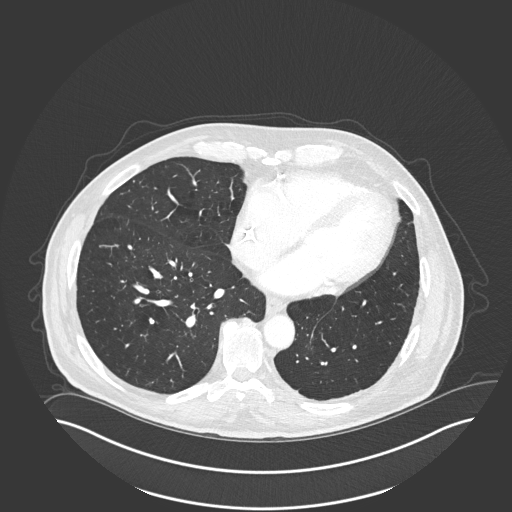
[im 79/172  lung]
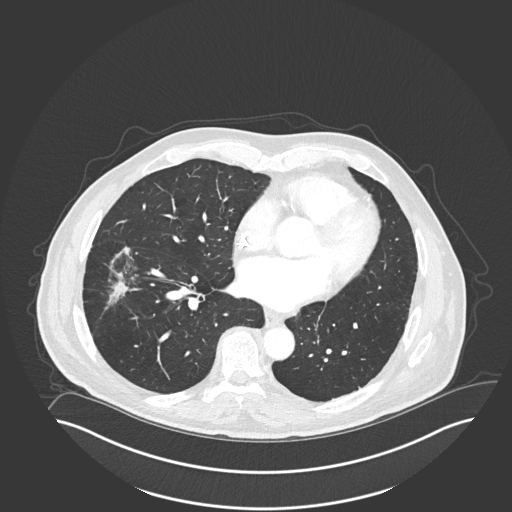
[im 93/172  lung]
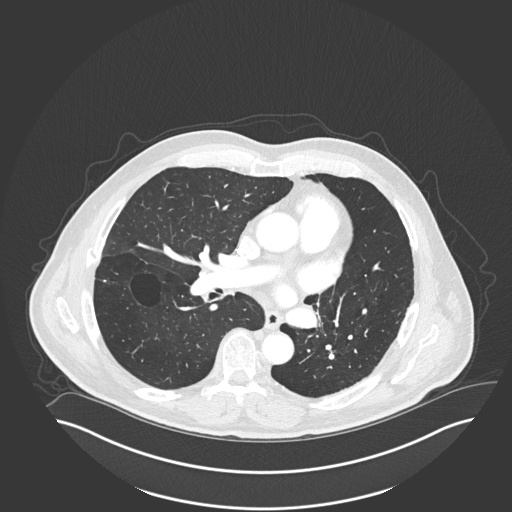
[im 106/172  lung]
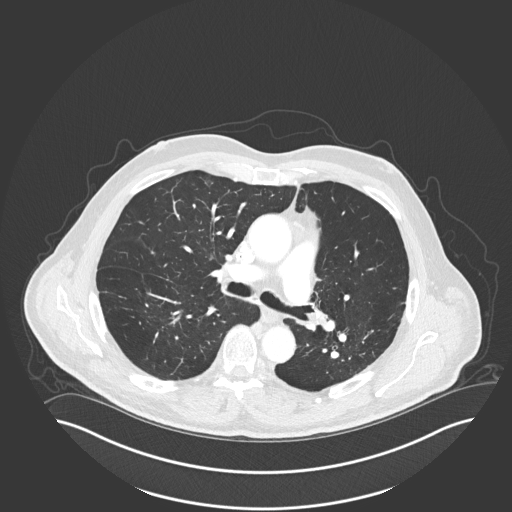
[im 119/172  mediastinal]
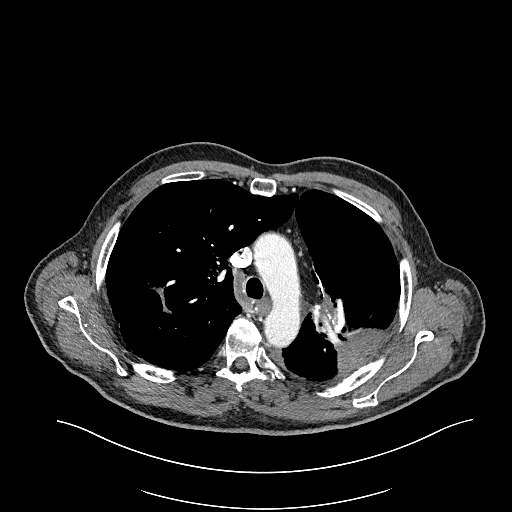
[im 119/172  lung]
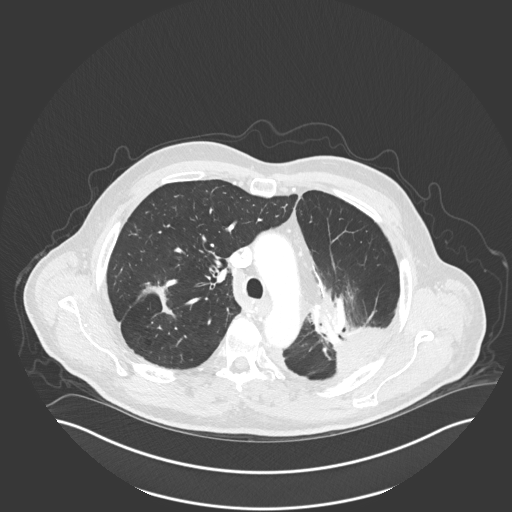
[im 132/172  lung]
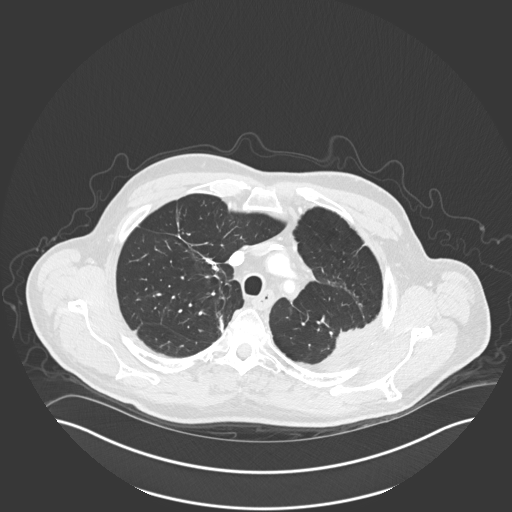
[im 145/172  lung]
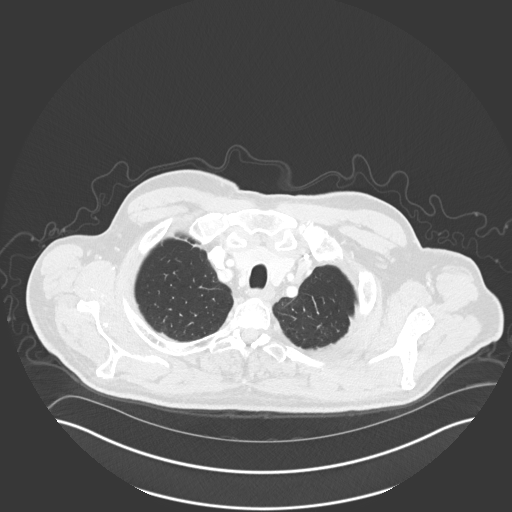
[im 158/172  lung]
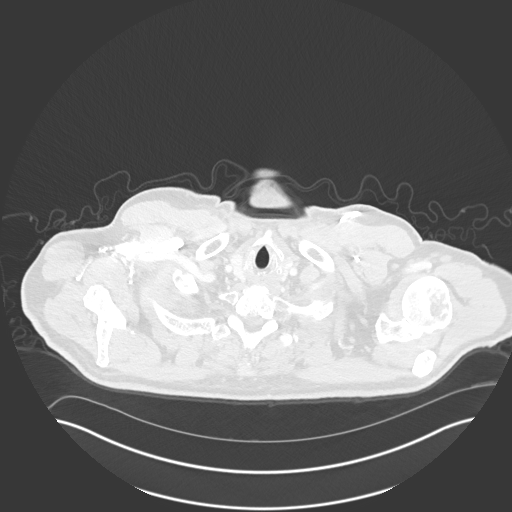

[Series 5: coronal · coronal · 0.67mm/px · 3 of 153 slices shown]
[im 31/153  lung]
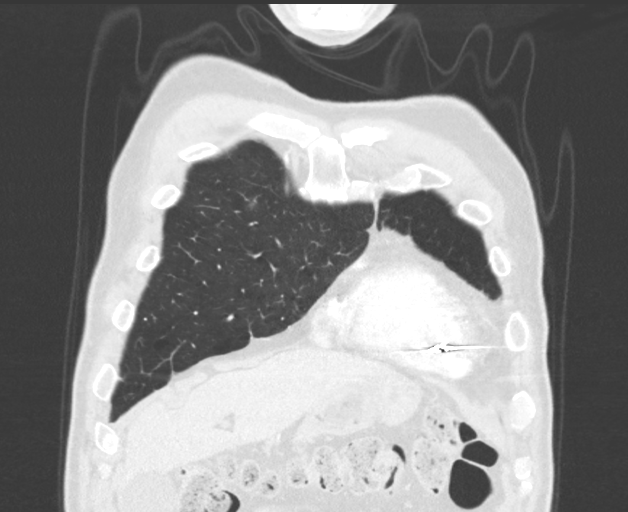
[im 61/153  lung]
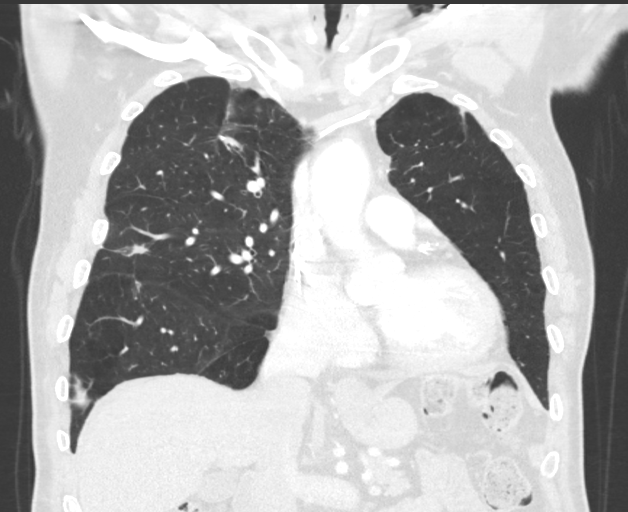
[im 92/153  lung]
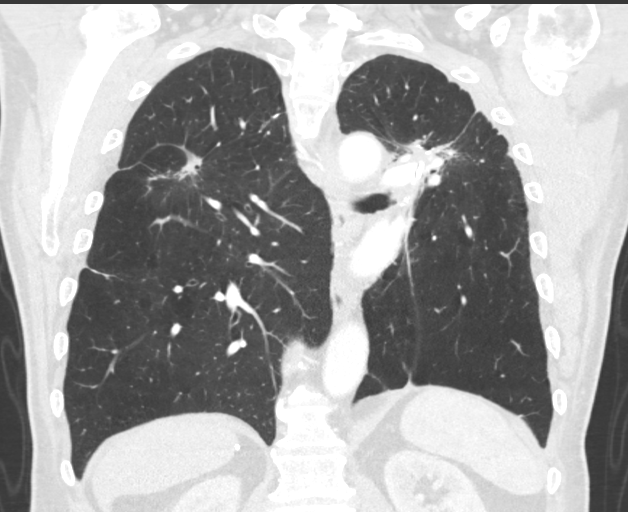

[15 of 36 positions shown; findings below may reference images not displayed]

FINDINGS: Cardiovascular: The heart is normal in size. No pericardial
effusion.

No evidence of thoracic aortic aneurysm. Atherosclerotic
calcifications the aortic arch.

Three vessel coronary atherosclerosis.

Left subclavian pacemaker.

Mediastinum/Nodes: Calcified mediastinal nodes, within normal
limits.

Visualized thyroid is unremarkable.

Lungs/Pleura: Status post left upper lobectomy. Wedge-shaped opacity
in the left perihilar region is similar to the prior and likely
reflects radiation changes (series 2/image 53). Underlying viable
tumor in this region is difficult to exclude.

Progressive nodular opacity in the superior segment right lower
lobe, measuring 2.2 x 2.6 cm (series 7/image 59), previously 8 mm.
Surrounding opacity raise the possibility of radiation changes.

Additional 3.1 x 1.7 cm masslike opacity in the lateral right lower
lobe (series 7/image 93), previously 2.8 x 1.1 cm, also with
possible surrounding radiation changes. Adjacent satellite
nodularity anteriorly in the right lower lobe (series 7/image 93)
and posteriorly in the right upper lobe along the fissure (series
7/image 84), minimally progressive.

Status post right upper lobe wedge resection.

Centrilobular and paraseptal emphysematous changes.

No pleural effusion or pneumothorax.

Upper Abdomen: Visualized upper abdomen is notable for prior right
adrenalectomy.

Musculoskeletal: Degenerative changes of the visualized
thoracolumbar spine.
IMPRESSION: Status post left upper lobectomy and right upper lobe wedge
resection.

Stable wedge-shaped opacity in the left perihilar region, favoring
radiation changes. Underlying viable tumor in this region is
difficult to exclude.

2.2 x 2.6 cm right lower lobe nodule and 3.1 x 1.7 cm right lower
lobe mass, as above, suspicious for metastases. Additional satellite
nodularity is also minimally progressive.

Aortic Atherosclerosis (CJ6P6-1MX.X) and Emphysema (CJ6P6-YNX.O).

## 2019-02-16 NOTE — Progress Notes (Signed)
  Radiation Oncology         [phone] ________________________________  Name: Benjamin [name] MRN: [id number]  Date: 12/16/2018  DOB: [birth year]  End of Treatment Note  Diagnosis:   73 y.o. patient with spinal cord/cauda metastasis from cancer of the right upper lung     Indication for treatment:  Palliation       Radiation treatment dates:   12/03/18-12/16/18  Site/dose:   The conus mass at T12-L1 was treated to 30 Gy in 10 fractions  Beams/energy:   3D radiation was used with 2 fields  Narrative: The patient tolerated radiation treatment relatively well.   His pain improved.  Plan: The patient has completed radiation treatment.     Sheral Apley Tammi Klippel, M.D.
